# Patient Record
Sex: Female | Born: 1935 | Race: White | Hispanic: No | Marital: Married | Smoking: Never smoker
Health system: Southern US, Community
[De-identification: ages and names within clinical notes are randomized; demographics above are authoritative.]

## PROBLEM LIST (undated history)

## (undated) DIAGNOSIS — IMO0001 Reserved for inherently not codable concepts without codable children: Secondary | ICD-10-CM

## (undated) DIAGNOSIS — K589 Irritable bowel syndrome without diarrhea: Secondary | ICD-10-CM

## (undated) DIAGNOSIS — K5732 Diverticulitis of large intestine without perforation or abscess without bleeding: Secondary | ICD-10-CM

## (undated) DIAGNOSIS — M1990 Unspecified osteoarthritis, unspecified site: Secondary | ICD-10-CM

## (undated) DIAGNOSIS — K219 Gastro-esophageal reflux disease without esophagitis: Secondary | ICD-10-CM

## (undated) DIAGNOSIS — D508 Other iron deficiency anemias: Secondary | ICD-10-CM

## (undated) DIAGNOSIS — J9601 Acute respiratory failure with hypoxia: Secondary | ICD-10-CM

## (undated) DIAGNOSIS — J312 Chronic pharyngitis: Secondary | ICD-10-CM

## (undated) DIAGNOSIS — K922 Gastrointestinal hemorrhage, unspecified: Secondary | ICD-10-CM

## (undated) DIAGNOSIS — J189 Pneumonia, unspecified organism: Secondary | ICD-10-CM

## (undated) DIAGNOSIS — K625 Hemorrhage of anus and rectum: Secondary | ICD-10-CM

## (undated) DIAGNOSIS — D473 Essential (hemorrhagic) thrombocythemia: Secondary | ICD-10-CM

## (undated) DIAGNOSIS — N189 Chronic kidney disease, unspecified: Secondary | ICD-10-CM

## (undated) DIAGNOSIS — J45909 Unspecified asthma, uncomplicated: Secondary | ICD-10-CM

## (undated) DIAGNOSIS — J069 Acute upper respiratory infection, unspecified: Secondary | ICD-10-CM

## (undated) DIAGNOSIS — D72829 Elevated white blood cell count, unspecified: Secondary | ICD-10-CM

## (undated) DIAGNOSIS — R011 Cardiac murmur, unspecified: Secondary | ICD-10-CM

## (undated) DIAGNOSIS — K5720 Diverticulitis of large intestine with perforation and abscess without bleeding: Secondary | ICD-10-CM

## (undated) DIAGNOSIS — Z5189 Encounter for other specified aftercare: Secondary | ICD-10-CM

## (undated) DIAGNOSIS — E785 Hyperlipidemia, unspecified: Secondary | ICD-10-CM

## (undated) DIAGNOSIS — R0600 Dyspnea, unspecified: Secondary | ICD-10-CM

## (undated) DIAGNOSIS — E871 Hypo-osmolality and hyponatremia: Secondary | ICD-10-CM

## (undated) DIAGNOSIS — E039 Hypothyroidism, unspecified: Secondary | ICD-10-CM

## (undated) DIAGNOSIS — R2689 Other abnormalities of gait and mobility: Secondary | ICD-10-CM

## (undated) DIAGNOSIS — I10 Essential (primary) hypertension: Secondary | ICD-10-CM

## (undated) DIAGNOSIS — R0609 Other forms of dyspnea: Secondary | ICD-10-CM

## (undated) DIAGNOSIS — R42 Dizziness and giddiness: Secondary | ICD-10-CM

## (undated) DIAGNOSIS — K633 Ulcer of intestine: Secondary | ICD-10-CM

## (undated) DIAGNOSIS — A319 Mycobacterial infection, unspecified: Secondary | ICD-10-CM

## (undated) DIAGNOSIS — B370 Candidal stomatitis: Secondary | ICD-10-CM

## (undated) DIAGNOSIS — D75839 Thrombocytosis, unspecified: Secondary | ICD-10-CM

## (undated) HISTORY — PX: VESICOVAGINAL FISTULA CLOSURE W/ TAH: SUR271

## (undated) HISTORY — DX: Pneumonia, unspecified organism: J18.9

## (undated) HISTORY — DX: Hypo-osmolality and hyponatremia: E87.1

## (undated) HISTORY — DX: Gastro-esophageal reflux disease without esophagitis: K21.9

## (undated) HISTORY — DX: Cardiac murmur, unspecified: R01.1

## (undated) HISTORY — DX: Candidal stomatitis: B37.0

## (undated) HISTORY — DX: Essential (primary) hypertension: I10

## (undated) HISTORY — DX: Dyspnea, unspecified: R06.00

## (undated) HISTORY — DX: Unspecified asthma, uncomplicated: J45.909

## (undated) HISTORY — DX: Unspecified osteoarthritis, unspecified site: M19.90

## (undated) HISTORY — DX: Mycobacterial infection, unspecified: A31.9

## (undated) HISTORY — DX: Other iron deficiency anemias: D50.8

## (undated) HISTORY — DX: Acute respiratory failure with hypoxia: J96.01

## (undated) HISTORY — DX: Thrombocytosis, unspecified: D75.839

## (undated) HISTORY — DX: Hyperlipidemia, unspecified: E78.5

## (undated) HISTORY — DX: Elevated white blood cell count, unspecified: D72.829

## (undated) HISTORY — PX: APPENDECTOMY: SHX54

## (undated) HISTORY — PX: CRANIOTOMY: SHX93

## (undated) HISTORY — DX: Chronic kidney disease, unspecified: N18.9

## (undated) HISTORY — PX: OTHER SURGICAL HISTORY: SHX169

## (undated) HISTORY — PX: EYE SURGERY: SHX253

## (undated) HISTORY — DX: Diverticulitis of large intestine without perforation or abscess without bleeding: K57.32

## (undated) HISTORY — DX: Other forms of dyspnea: R06.09

## (undated) HISTORY — DX: Ulcer of intestine: K63.3

## (undated) HISTORY — DX: Diverticulitis of large intestine with perforation and abscess without bleeding: K57.20

## (undated) HISTORY — DX: Gastrointestinal hemorrhage, unspecified: K92.2

## (undated) HISTORY — DX: Dizziness and giddiness: R42

## (undated) HISTORY — DX: Essential (hemorrhagic) thrombocythemia: D47.3

## (undated) HISTORY — DX: Hemorrhage of anus and rectum: K62.5

## (undated) HISTORY — DX: Hypothyroidism, unspecified: E03.9

## (undated) HISTORY — DX: Chronic pharyngitis: J31.2

---
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
## 1965-05-11 HISTORY — PX: DILATION AND CURETTAGE OF UTERUS: SHX78

## 1983-05-12 HISTORY — PX: ABDOMINAL HYSTERECTOMY: SHX81

## 1984-05-11 HISTORY — PX: THYROIDECTOMY: SHX17

## 1997-10-02 ENCOUNTER — Other Ambulatory Visit: Admission: RE | Admit: 1997-10-02 | Discharge: 1997-10-02 | Payer: Self-pay | Admitting: *Deleted

## 1998-05-11 HISTORY — PX: OTHER SURGICAL HISTORY: SHX169

## 1998-07-17 ENCOUNTER — Encounter: Payer: Self-pay | Admitting: Oral Surgery

## 1998-07-17 ENCOUNTER — Inpatient Hospital Stay (HOSPITAL_COMMUNITY): Admission: EM | Admit: 1998-07-17 | Discharge: 1998-07-21 | Payer: Self-pay | Admitting: Emergency Medicine

## 1998-10-09 ENCOUNTER — Other Ambulatory Visit: Admission: RE | Admit: 1998-10-09 | Discharge: 1998-10-09 | Payer: Self-pay | Admitting: *Deleted

## 1999-10-21 ENCOUNTER — Other Ambulatory Visit: Admission: RE | Admit: 1999-10-21 | Discharge: 1999-10-21 | Payer: Self-pay | Admitting: *Deleted

## 2000-01-14 ENCOUNTER — Encounter: Admission: RE | Admit: 2000-01-14 | Discharge: 2000-01-14 | Payer: Self-pay | Admitting: Internal Medicine

## 2000-01-14 ENCOUNTER — Encounter: Payer: Self-pay | Admitting: Internal Medicine

## 2000-12-02 ENCOUNTER — Other Ambulatory Visit: Admission: RE | Admit: 2000-12-02 | Discharge: 2000-12-02 | Payer: Self-pay | Admitting: *Deleted

## 2001-01-12 ENCOUNTER — Ambulatory Visit (HOSPITAL_COMMUNITY): Admission: RE | Admit: 2001-01-12 | Discharge: 2001-01-12 | Payer: Self-pay | Admitting: Gastroenterology

## 2001-07-17 ENCOUNTER — Emergency Department (HOSPITAL_COMMUNITY): Admission: EM | Admit: 2001-07-17 | Discharge: 2001-07-17 | Payer: Self-pay | Admitting: Emergency Medicine

## 2002-09-18 ENCOUNTER — Encounter: Admission: RE | Admit: 2002-09-18 | Discharge: 2002-12-17 | Payer: Self-pay | Admitting: Internal Medicine

## 2003-06-18 ENCOUNTER — Ambulatory Visit (HOSPITAL_COMMUNITY): Admission: RE | Admit: 2003-06-18 | Discharge: 2003-06-18 | Payer: Self-pay | Admitting: Gastroenterology

## 2003-06-19 ENCOUNTER — Encounter (INDEPENDENT_AMBULATORY_CARE_PROVIDER_SITE_OTHER): Payer: Self-pay | Admitting: Specialist

## 2003-07-26 ENCOUNTER — Encounter: Admission: RE | Admit: 2003-07-26 | Discharge: 2003-07-26 | Payer: Self-pay | Admitting: Internal Medicine

## 2004-01-15 ENCOUNTER — Ambulatory Visit (HOSPITAL_COMMUNITY): Admission: RE | Admit: 2004-01-15 | Discharge: 2004-01-15 | Payer: Self-pay | Admitting: Internal Medicine

## 2004-01-16 ENCOUNTER — Ambulatory Visit (HOSPITAL_COMMUNITY): Admission: RE | Admit: 2004-01-16 | Discharge: 2004-01-16 | Payer: Self-pay | Admitting: Internal Medicine

## 2004-07-10 ENCOUNTER — Ambulatory Visit: Payer: Self-pay | Admitting: Internal Medicine

## 2004-07-17 ENCOUNTER — Ambulatory Visit: Payer: Self-pay | Admitting: Internal Medicine

## 2004-08-07 ENCOUNTER — Ambulatory Visit: Payer: Self-pay | Admitting: Internal Medicine

## 2005-02-04 ENCOUNTER — Ambulatory Visit: Payer: Self-pay | Admitting: Internal Medicine

## 2005-02-08 HISTORY — PX: TOTAL HIP ARTHROPLASTY: SHX124

## 2005-02-09 ENCOUNTER — Inpatient Hospital Stay (HOSPITAL_COMMUNITY): Admission: RE | Admit: 2005-02-09 | Discharge: 2005-02-12 | Payer: Self-pay | Admitting: Orthopedic Surgery

## 2005-05-18 ENCOUNTER — Encounter: Admission: RE | Admit: 2005-05-18 | Discharge: 2005-05-18 | Payer: Self-pay | Admitting: Gastroenterology

## 2005-06-23 ENCOUNTER — Ambulatory Visit: Payer: Self-pay | Admitting: Internal Medicine

## 2005-08-04 ENCOUNTER — Ambulatory Visit: Payer: Self-pay | Admitting: Internal Medicine

## 2005-08-20 ENCOUNTER — Encounter: Admission: RE | Admit: 2005-08-20 | Discharge: 2005-08-20 | Payer: Self-pay | Admitting: Orthopedic Surgery

## 2005-08-25 ENCOUNTER — Encounter (INDEPENDENT_AMBULATORY_CARE_PROVIDER_SITE_OTHER): Payer: Self-pay | Admitting: *Deleted

## 2005-08-25 ENCOUNTER — Ambulatory Visit (HOSPITAL_BASED_OUTPATIENT_CLINIC_OR_DEPARTMENT_OTHER): Admission: RE | Admit: 2005-08-25 | Discharge: 2005-08-25 | Payer: Self-pay | Admitting: Orthopedic Surgery

## 2005-11-02 ENCOUNTER — Ambulatory Visit: Payer: Self-pay | Admitting: Internal Medicine

## 2005-11-09 ENCOUNTER — Ambulatory Visit: Payer: Self-pay | Admitting: Hematology & Oncology

## 2005-11-25 LAB — CBC WITH DIFFERENTIAL/PLATELET
Basophils Absolute: 0 10*3/uL (ref 0.0–0.1)
Eosinophils Absolute: 0.1 10*3/uL (ref 0.0–0.5)
HGB: 12.2 g/dL (ref 11.6–15.9)
LYMPH%: 9.5 % — ABNORMAL LOW (ref 14.0–48.0)
MCV: 77.4 fL — ABNORMAL LOW (ref 81.0–101.0)
MONO%: 4.1 % (ref 0.0–13.0)
NEUT#: 10.9 10*3/uL — ABNORMAL HIGH (ref 1.5–6.5)
Platelets: 555 10*3/uL — ABNORMAL HIGH (ref 145–400)

## 2005-12-01 LAB — JAK2 GENOTYPR

## 2005-12-01 LAB — VITAMIN B12: Vitamin B-12: 811 pg/mL (ref 211–911)

## 2005-12-01 LAB — LACTATE DEHYDROGENASE: LDH: 138 U/L (ref 94–250)

## 2005-12-01 LAB — TRANSFERRIN RECEPTOR, SOLUABLE: Transferrin Receptor, Soluble: 5.3 mg/L — ABNORMAL HIGH (ref 1.9–4.4)

## 2005-12-01 LAB — SEDIMENTATION RATE: Sed Rate: 60 mm/hr — ABNORMAL HIGH (ref 0–22)

## 2006-01-18 ENCOUNTER — Ambulatory Visit: Payer: Self-pay | Admitting: Hematology & Oncology

## 2006-01-20 LAB — CBC WITH DIFFERENTIAL/PLATELET
Basophils Absolute: 0 10*3/uL (ref 0.0–0.1)
EOS%: 2.3 % (ref 0.0–7.0)
HCT: 36.2 % (ref 34.8–46.6)
HGB: 12.1 g/dL (ref 11.6–15.9)
LYMPH%: 11.5 % — ABNORMAL LOW (ref 14.0–48.0)
MCH: 25.6 pg — ABNORMAL LOW (ref 26.0–34.0)
NEUT%: 80.7 % — ABNORMAL HIGH (ref 39.6–76.8)
Platelets: 525 10*3/uL — ABNORMAL HIGH (ref 145–400)
lymph#: 1.6 10*3/uL (ref 0.9–3.3)

## 2006-04-07 ENCOUNTER — Emergency Department (HOSPITAL_COMMUNITY): Admission: EM | Admit: 2006-04-07 | Discharge: 2006-04-07 | Payer: Self-pay | Admitting: Emergency Medicine

## 2006-05-11 HISTORY — PX: PARTIAL COLECTOMY: SHX5273

## 2006-05-14 ENCOUNTER — Ambulatory Visit: Payer: Self-pay | Admitting: Hematology & Oncology

## 2006-05-19 LAB — CBC WITH DIFFERENTIAL/PLATELET
BASO%: 0.2 % (ref 0.0–2.0)
EOS%: 2.3 % (ref 0.0–7.0)
HCT: 33.4 % — ABNORMAL LOW (ref 34.8–46.6)
MCH: 25.3 pg — ABNORMAL LOW (ref 26.0–34.0)
MCHC: 32.8 g/dL (ref 32.0–36.0)
NEUT%: 78 % — ABNORMAL HIGH (ref 39.6–76.8)
RDW: 15.2 % — ABNORMAL HIGH (ref 11.3–14.5)
lymph#: 1.7 10*3/uL (ref 0.9–3.3)

## 2006-06-28 ENCOUNTER — Ambulatory Visit: Payer: Self-pay | Admitting: Hematology & Oncology

## 2006-06-30 LAB — CBC WITH DIFFERENTIAL/PLATELET
BASO%: 0.3 % (ref 0.0–2.0)
EOS%: 3 % (ref 0.0–7.0)
Eosinophils Absolute: 0.3 10*3/uL (ref 0.0–0.5)
MCV: 79.2 fL — ABNORMAL LOW (ref 81.0–101.0)
MONO%: 10.6 % (ref 0.0–13.0)
NEUT#: 7.4 10*3/uL — ABNORMAL HIGH (ref 1.5–6.5)
RBC: 4.7 10*6/uL (ref 3.70–5.32)
RDW: 20.7 % — ABNORMAL HIGH (ref 11.3–14.5)

## 2006-06-30 LAB — CHCC SMEAR

## 2006-07-02 LAB — TRANSFERRIN RECEPTOR, SOLUABLE: Transferrin Receptor, Soluble: 22 nmol/L

## 2006-07-02 LAB — FERRITIN: Ferritin: 504 ng/mL — ABNORMAL HIGH (ref 10–291)

## 2006-07-12 ENCOUNTER — Ambulatory Visit: Payer: Self-pay | Admitting: Internal Medicine

## 2006-10-05 ENCOUNTER — Ambulatory Visit: Payer: Self-pay | Admitting: Hematology & Oncology

## 2006-10-07 LAB — CBC WITH DIFFERENTIAL/PLATELET
BASO%: 0.4 % (ref 0.0–2.0)
LYMPH%: 23.1 % (ref 14.0–48.0)
MCHC: 34.7 g/dL (ref 32.0–36.0)
MONO#: 1 10*3/uL — ABNORMAL HIGH (ref 0.1–0.9)
NEUT#: 6.1 10*3/uL (ref 1.5–6.5)
Platelets: 366 10*3/uL (ref 145–400)
RBC: 4.49 10*6/uL (ref 3.70–5.32)
RDW: 14.2 % (ref 11.3–14.5)
WBC: 9.6 10*3/uL (ref 3.9–10.0)

## 2006-10-07 LAB — CHCC SMEAR

## 2006-12-14 ENCOUNTER — Ambulatory Visit (HOSPITAL_COMMUNITY): Admission: RE | Admit: 2006-12-14 | Discharge: 2006-12-14 | Payer: Self-pay | Admitting: Family Medicine

## 2006-12-19 ENCOUNTER — Inpatient Hospital Stay (HOSPITAL_COMMUNITY): Admission: EM | Admit: 2006-12-19 | Discharge: 2006-12-24 | Payer: Self-pay | Admitting: Emergency Medicine

## 2007-01-27 ENCOUNTER — Encounter: Admission: RE | Admit: 2007-01-27 | Discharge: 2007-01-27 | Payer: Self-pay | Admitting: Gastroenterology

## 2007-02-01 ENCOUNTER — Ambulatory Visit: Payer: Self-pay | Admitting: Hematology & Oncology

## 2007-02-03 LAB — CBC WITH DIFFERENTIAL/PLATELET
BASO%: 0.3 % (ref 0.0–2.0)
Basophils Absolute: 0 10*3/uL (ref 0.0–0.1)
EOS%: 2.8 % (ref 0.0–7.0)
HGB: 12.6 g/dL (ref 11.6–15.9)
MCH: 28.8 pg (ref 26.0–34.0)
MCHC: 34.3 g/dL (ref 32.0–36.0)
MONO#: 1.1 10*3/uL — ABNORMAL HIGH (ref 0.1–0.9)
RDW: 13.4 % (ref 11.3–14.5)
WBC: 12.5 10*3/uL — ABNORMAL HIGH (ref 3.9–10.0)
lymph#: 2.4 10*3/uL (ref 0.9–3.3)

## 2007-02-03 LAB — FERRITIN: Ferritin: 283 ng/mL (ref 10–291)

## 2007-02-15 ENCOUNTER — Encounter (INDEPENDENT_AMBULATORY_CARE_PROVIDER_SITE_OTHER): Payer: Self-pay | Admitting: Surgery

## 2007-02-15 ENCOUNTER — Inpatient Hospital Stay (HOSPITAL_COMMUNITY): Admission: RE | Admit: 2007-02-15 | Discharge: 2007-02-23 | Payer: Self-pay | Admitting: Surgery

## 2007-04-19 ENCOUNTER — Ambulatory Visit: Payer: Self-pay | Admitting: Hematology & Oncology

## 2007-04-21 ENCOUNTER — Encounter: Payer: Self-pay | Admitting: Internal Medicine

## 2007-04-21 LAB — CBC WITH DIFFERENTIAL/PLATELET
BASO%: 0.5 % (ref 0.0–2.0)
Eosinophils Absolute: 0.2 10*3/uL (ref 0.0–0.5)
HCT: 36.8 % (ref 34.8–46.6)
HGB: 12.4 g/dL (ref 11.6–15.9)
LYMPH%: 21.7 % (ref 14.0–48.0)
MCHC: 33.6 g/dL (ref 32.0–36.0)
MONO#: 0.9 10*3/uL (ref 0.1–0.9)
NEUT#: 6.5 10*3/uL (ref 1.5–6.5)
NEUT%: 66.7 % (ref 39.6–76.8)
Platelets: 395 10*3/uL (ref 145–400)
WBC: 9.8 10*3/uL (ref 3.9–10.0)
lymph#: 2.1 10*3/uL (ref 0.9–3.3)

## 2007-04-23 LAB — TRANSFERRIN RECEPTOR, SOLUABLE: Transferrin Receptor, Soluble: 22.6 nmol/L

## 2007-04-29 ENCOUNTER — Emergency Department (HOSPITAL_COMMUNITY): Admission: EM | Admit: 2007-04-29 | Discharge: 2007-04-29 | Payer: Self-pay | Admitting: Emergency Medicine

## 2007-07-11 DIAGNOSIS — J300 Vasomotor rhinitis: Secondary | ICD-10-CM

## 2007-07-11 DIAGNOSIS — J312 Chronic pharyngitis: Secondary | ICD-10-CM

## 2007-07-11 DIAGNOSIS — M129 Arthropathy, unspecified: Secondary | ICD-10-CM | POA: Insufficient documentation

## 2007-07-11 DIAGNOSIS — D759 Disease of blood and blood-forming organs, unspecified: Secondary | ICD-10-CM | POA: Insufficient documentation

## 2007-07-11 DIAGNOSIS — D72829 Elevated white blood cell count, unspecified: Secondary | ICD-10-CM | POA: Insufficient documentation

## 2007-07-11 DIAGNOSIS — J209 Acute bronchitis, unspecified: Secondary | ICD-10-CM

## 2007-07-11 DIAGNOSIS — K5732 Diverticulitis of large intestine without perforation or abscess without bleeding: Secondary | ICD-10-CM

## 2007-07-11 DIAGNOSIS — E785 Hyperlipidemia, unspecified: Secondary | ICD-10-CM

## 2007-07-11 DIAGNOSIS — R011 Cardiac murmur, unspecified: Secondary | ICD-10-CM

## 2007-07-11 DIAGNOSIS — I10 Essential (primary) hypertension: Secondary | ICD-10-CM | POA: Insufficient documentation

## 2007-07-11 DIAGNOSIS — K219 Gastro-esophageal reflux disease without esophagitis: Secondary | ICD-10-CM | POA: Insufficient documentation

## 2007-07-11 DIAGNOSIS — D508 Other iron deficiency anemias: Secondary | ICD-10-CM | POA: Insufficient documentation

## 2007-07-27 ENCOUNTER — Ambulatory Visit: Payer: Self-pay | Admitting: Internal Medicine

## 2007-10-18 ENCOUNTER — Ambulatory Visit: Payer: Self-pay | Admitting: Hematology & Oncology

## 2007-10-20 ENCOUNTER — Encounter: Payer: Self-pay | Admitting: Internal Medicine

## 2007-10-20 LAB — CBC WITH DIFFERENTIAL/PLATELET
Basophils Absolute: 0.1 10*3/uL (ref 0.0–0.1)
Eosinophils Absolute: 0.3 10*3/uL (ref 0.0–0.5)
HCT: 37.4 % (ref 34.8–46.6)
HGB: 12.4 g/dL (ref 11.6–15.9)
LYMPH%: 25.6 % (ref 14.0–48.0)
MCV: 83.3 fL (ref 81.0–101.0)
MONO%: 10.1 % (ref 0.0–13.0)
NEUT#: 6.1 10*3/uL (ref 1.5–6.5)
Platelets: 358 10*3/uL (ref 145–400)

## 2007-10-22 LAB — TRANSFERRIN RECEPTOR, SOLUABLE: Transferrin Receptor, Soluble: 23.3 nmol/L

## 2008-03-16 ENCOUNTER — Ambulatory Visit (HOSPITAL_COMMUNITY): Admission: RE | Admit: 2008-03-16 | Discharge: 2008-03-16 | Payer: Self-pay | Admitting: Urology

## 2008-05-01 ENCOUNTER — Ambulatory Visit: Payer: Self-pay | Admitting: Internal Medicine

## 2008-07-17 ENCOUNTER — Ambulatory Visit: Payer: Self-pay | Admitting: Internal Medicine

## 2008-11-07 ENCOUNTER — Encounter: Payer: Self-pay | Admitting: Internal Medicine

## 2008-11-27 ENCOUNTER — Telehealth (INDEPENDENT_AMBULATORY_CARE_PROVIDER_SITE_OTHER): Payer: Self-pay | Admitting: *Deleted

## 2008-11-27 ENCOUNTER — Ambulatory Visit: Payer: Self-pay | Admitting: Internal Medicine

## 2008-11-27 ENCOUNTER — Telehealth: Payer: Self-pay | Admitting: Internal Medicine

## 2008-11-27 DIAGNOSIS — R930 Abnormal findings on diagnostic imaging of skull and head, not elsewhere classified: Secondary | ICD-10-CM | POA: Insufficient documentation

## 2008-11-27 LAB — CONVERTED CEMR LAB
Basophils Absolute: 0.1 10*3/uL (ref 0.0–0.1)
Eosinophils Absolute: 0.3 10*3/uL (ref 0.0–0.7)
Eosinophils Relative: 1.7 % (ref 0.0–5.0)
GFR calc non Af Amer: 73.78 mL/min (ref >60)
MCHC: 34.2 g/dL (ref 30.0–36.0)
MCV: 78.4 fL (ref 78.0–100.0)
Monocytes Absolute: 0.8 10*3/uL (ref 0.1–1.0)
Neutrophils Relative %: 88.7 % — ABNORMAL HIGH (ref 43.0–77.0)
Platelets: 331 10*3/uL (ref 150.0–400.0)
Potassium: 3.3 meq/L — ABNORMAL LOW (ref 3.5–5.1)
Sodium: 141 meq/L (ref 135–145)
WBC: 18 10*3/uL (ref 4.5–10.5)

## 2008-11-28 ENCOUNTER — Telehealth (INDEPENDENT_AMBULATORY_CARE_PROVIDER_SITE_OTHER): Payer: Self-pay | Admitting: *Deleted

## 2008-12-12 ENCOUNTER — Ambulatory Visit: Payer: Self-pay | Admitting: Internal Medicine

## 2008-12-18 ENCOUNTER — Encounter: Payer: Self-pay | Admitting: Internal Medicine

## 2008-12-31 ENCOUNTER — Ambulatory Visit: Payer: Self-pay | Admitting: Internal Medicine

## 2009-04-09 ENCOUNTER — Encounter: Payer: Self-pay | Admitting: Internal Medicine

## 2009-04-30 ENCOUNTER — Ambulatory Visit: Payer: Self-pay | Admitting: Internal Medicine

## 2009-04-30 DIAGNOSIS — B370 Candidal stomatitis: Secondary | ICD-10-CM

## 2009-04-30 DIAGNOSIS — J4530 Mild persistent asthma, uncomplicated: Secondary | ICD-10-CM | POA: Insufficient documentation

## 2009-04-30 HISTORY — DX: Candidal stomatitis: B37.0

## 2009-05-07 ENCOUNTER — Emergency Department (HOSPITAL_COMMUNITY): Admission: EM | Admit: 2009-05-07 | Discharge: 2009-05-08 | Payer: Self-pay | Admitting: Emergency Medicine

## 2009-05-08 ENCOUNTER — Ambulatory Visit: Payer: Self-pay | Admitting: Internal Medicine

## 2009-05-14 ENCOUNTER — Telehealth: Payer: Self-pay | Admitting: Internal Medicine

## 2009-05-14 ENCOUNTER — Inpatient Hospital Stay (HOSPITAL_COMMUNITY): Admission: EM | Admit: 2009-05-14 | Discharge: 2009-05-20 | Payer: Self-pay | Admitting: Emergency Medicine

## 2009-05-14 ENCOUNTER — Ambulatory Visit: Payer: Self-pay | Admitting: Critical Care Medicine

## 2009-05-27 ENCOUNTER — Ambulatory Visit: Payer: Self-pay | Admitting: Internal Medicine

## 2009-06-03 ENCOUNTER — Telehealth (INDEPENDENT_AMBULATORY_CARE_PROVIDER_SITE_OTHER): Payer: Self-pay | Admitting: *Deleted

## 2009-06-19 ENCOUNTER — Ambulatory Visit: Payer: Self-pay | Admitting: Internal Medicine

## 2009-08-20 ENCOUNTER — Encounter: Admission: RE | Admit: 2009-08-20 | Discharge: 2009-08-20 | Payer: Self-pay | Admitting: Otolaryngology

## 2009-08-29 ENCOUNTER — Ambulatory Visit: Payer: Self-pay | Admitting: Internal Medicine

## 2009-09-02 ENCOUNTER — Ambulatory Visit: Payer: Self-pay | Admitting: Internal Medicine

## 2009-09-09 ENCOUNTER — Ambulatory Visit: Payer: Self-pay | Admitting: Internal Medicine

## 2009-09-09 ENCOUNTER — Telehealth (INDEPENDENT_AMBULATORY_CARE_PROVIDER_SITE_OTHER): Payer: Self-pay | Admitting: *Deleted

## 2009-11-05 ENCOUNTER — Ambulatory Visit: Payer: Self-pay | Admitting: Internal Medicine

## 2009-11-05 DIAGNOSIS — R490 Dysphonia: Secondary | ICD-10-CM | POA: Insufficient documentation

## 2009-12-16 ENCOUNTER — Telehealth: Payer: Self-pay | Admitting: Internal Medicine

## 2009-12-24 ENCOUNTER — Encounter: Payer: Self-pay | Admitting: Internal Medicine

## 2010-02-27 ENCOUNTER — Ambulatory Visit: Payer: Self-pay | Admitting: Internal Medicine

## 2010-03-02 DIAGNOSIS — K5190 Ulcerative colitis, unspecified, without complications: Secondary | ICD-10-CM | POA: Insufficient documentation

## 2010-03-20 ENCOUNTER — Encounter: Admission: RE | Admit: 2010-03-20 | Discharge: 2010-03-20 | Payer: Self-pay | Admitting: Gastroenterology

## 2010-04-05 ENCOUNTER — Telehealth: Payer: Self-pay | Admitting: Pulmonary Disease

## 2010-04-09 ENCOUNTER — Telehealth (INDEPENDENT_AMBULATORY_CARE_PROVIDER_SITE_OTHER): Payer: Self-pay | Admitting: *Deleted

## 2010-04-10 ENCOUNTER — Ambulatory Visit: Payer: Self-pay | Admitting: Internal Medicine

## 2010-04-14 ENCOUNTER — Telehealth (INDEPENDENT_AMBULATORY_CARE_PROVIDER_SITE_OTHER): Payer: Self-pay | Admitting: *Deleted

## 2010-04-14 ENCOUNTER — Ambulatory Visit: Payer: Self-pay | Admitting: Internal Medicine

## 2010-04-15 ENCOUNTER — Telehealth: Payer: Self-pay | Admitting: Internal Medicine

## 2010-04-15 ENCOUNTER — Encounter: Payer: Self-pay | Admitting: Internal Medicine

## 2010-04-16 ENCOUNTER — Telehealth (INDEPENDENT_AMBULATORY_CARE_PROVIDER_SITE_OTHER): Payer: Self-pay | Admitting: *Deleted

## 2010-04-17 ENCOUNTER — Ambulatory Visit: Payer: Self-pay | Admitting: Internal Medicine

## 2010-04-17 ENCOUNTER — Encounter: Payer: Self-pay | Admitting: Internal Medicine

## 2010-04-18 ENCOUNTER — Telehealth (INDEPENDENT_AMBULATORY_CARE_PROVIDER_SITE_OTHER): Payer: Self-pay | Admitting: *Deleted

## 2010-04-24 ENCOUNTER — Inpatient Hospital Stay (HOSPITAL_COMMUNITY)
Admission: EM | Admit: 2010-04-24 | Discharge: 2010-04-29 | Payer: Self-pay | Source: Home / Self Care | Attending: Internal Medicine | Admitting: Internal Medicine

## 2010-04-24 ENCOUNTER — Encounter: Payer: Self-pay | Admitting: Internal Medicine

## 2010-04-24 ENCOUNTER — Telehealth (INDEPENDENT_AMBULATORY_CARE_PROVIDER_SITE_OTHER): Payer: Self-pay | Admitting: *Deleted

## 2010-04-24 LAB — CONVERTED CEMR LAB
Eosinophils Relative: 2.5 % (ref 0.0–5.0)
HCT: 36.8 % (ref 36.0–46.0)
Hemoglobin: 12.6 g/dL (ref 12.0–15.0)
Lymphs Abs: 1 10*3/uL (ref 0.7–4.0)
Monocytes Relative: 6.1 % (ref 3.0–12.0)
Neutro Abs: 11.7 10*3/uL — ABNORMAL HIGH (ref 1.4–7.7)
WBC: 14 10*3/uL — ABNORMAL HIGH (ref 4.5–10.5)

## 2010-04-25 ENCOUNTER — Encounter: Payer: Self-pay | Admitting: Internal Medicine

## 2010-04-30 ENCOUNTER — Telehealth (INDEPENDENT_AMBULATORY_CARE_PROVIDER_SITE_OTHER): Payer: Self-pay | Admitting: *Deleted

## 2010-04-30 ENCOUNTER — Telehealth: Payer: Self-pay | Admitting: Internal Medicine

## 2010-05-01 ENCOUNTER — Ambulatory Visit: Payer: Self-pay | Admitting: Internal Medicine

## 2010-05-01 ENCOUNTER — Telehealth (INDEPENDENT_AMBULATORY_CARE_PROVIDER_SITE_OTHER): Payer: Self-pay | Admitting: *Deleted

## 2010-05-01 DIAGNOSIS — E039 Hypothyroidism, unspecified: Secondary | ICD-10-CM

## 2010-05-02 ENCOUNTER — Encounter: Payer: Self-pay | Admitting: Internal Medicine

## 2010-05-05 DIAGNOSIS — A319 Mycobacterial infection, unspecified: Secondary | ICD-10-CM

## 2010-05-05 HISTORY — DX: Mycobacterial infection, unspecified: A31.9

## 2010-05-06 ENCOUNTER — Telehealth (INDEPENDENT_AMBULATORY_CARE_PROVIDER_SITE_OTHER): Payer: Self-pay | Admitting: *Deleted

## 2010-05-06 LAB — CONVERTED CEMR LAB
Calcium: 9.1 mg/dL (ref 8.4–10.5)
Creatinine, Ser: 0.8 mg/dL (ref 0.4–1.2)
Eosinophils Relative: 2.6 % (ref 0.0–5.0)
GFR calc non Af Amer: 69.48 mL/min (ref >60.00)
Glucose, Bld: 130 mg/dL — ABNORMAL HIGH (ref 70–99)
HCT: 43.8 % (ref 36.0–46.0)
Hemoglobin: 14.9 g/dL (ref 12.0–15.0)
Lymphs Abs: 2 10*3/uL (ref 0.7–4.0)
MCV: 88.2 fL (ref 78.0–100.0)
Monocytes Absolute: 0.7 10*3/uL (ref 0.1–1.0)
Neutro Abs: 14.4 10*3/uL — ABNORMAL HIGH (ref 1.4–7.7)
Platelets: 341 10*3/uL (ref 150.0–400.0)
RDW: 16.6 % — ABNORMAL HIGH (ref 11.5–14.6)
Sodium: 132 meq/L — ABNORMAL LOW (ref 135–145)
Total CK: 33 units/L (ref 7–177)
WBC: 17.6 10*3/uL — ABNORMAL HIGH (ref 4.5–10.5)

## 2010-05-30 ENCOUNTER — Other Ambulatory Visit: Payer: Self-pay | Admitting: Internal Medicine

## 2010-05-30 ENCOUNTER — Ambulatory Visit
Admission: RE | Admit: 2010-05-30 | Discharge: 2010-05-30 | Payer: Self-pay | Source: Home / Self Care | Attending: Internal Medicine | Admitting: Internal Medicine

## 2010-05-30 DIAGNOSIS — R0609 Other forms of dyspnea: Secondary | ICD-10-CM | POA: Insufficient documentation

## 2010-05-30 DIAGNOSIS — R0989 Other specified symptoms and signs involving the circulatory and respiratory systems: Secondary | ICD-10-CM

## 2010-05-30 LAB — BASIC METABOLIC PANEL
BUN: 17 mg/dL (ref 6–23)
CO2: 27 mEq/L (ref 19–32)
Calcium: 9.7 mg/dL (ref 8.4–10.5)
Chloride: 102 mEq/L (ref 96–112)
Creatinine, Ser: 0.7 mg/dL (ref 0.4–1.2)
GFR: 80.41 mL/min (ref >60.00)
Glucose, Bld: 151 mg/dL — ABNORMAL HIGH (ref 70–99)
Potassium: 4.1 mEq/L (ref 3.5–5.1)
Sodium: 139 mEq/L (ref 135–145)

## 2010-06-01 ENCOUNTER — Encounter: Payer: Self-pay | Admitting: Surgery

## 2010-06-01 ENCOUNTER — Encounter: Payer: Self-pay | Admitting: Urology

## 2010-06-02 ENCOUNTER — Ambulatory Visit: Payer: Self-pay | Admitting: Cardiology

## 2010-06-02 DIAGNOSIS — J189 Pneumonia, unspecified organism: Secondary | ICD-10-CM | POA: Insufficient documentation

## 2010-06-02 HISTORY — DX: Pneumonia, unspecified organism: J18.9

## 2010-06-03 ENCOUNTER — Ambulatory Visit
Admission: RE | Admit: 2010-06-03 | Discharge: 2010-06-03 | Payer: Self-pay | Source: Home / Self Care | Attending: Internal Medicine | Admitting: Internal Medicine

## 2010-06-03 ENCOUNTER — Telehealth (INDEPENDENT_AMBULATORY_CARE_PROVIDER_SITE_OTHER): Payer: Self-pay | Admitting: *Deleted

## 2010-06-03 ENCOUNTER — Encounter: Payer: Self-pay | Admitting: Internal Medicine

## 2010-06-10 NOTE — Progress Notes (Signed)
Summary: rx  Phone Note Call from Patient Call back at Home Phone [phone]   Caller: Patient Call For: young Reason for Call: Talk to Nurse Summary of Call: want to know if she should get an order of Cromaline before going to beach to keep fron having an asthma attack? [name] Initial call taken by: Eugene Gavia,  December 16, 2009 3:22 PM  Follow-up for Phone Call        Spoke with pt.  She states that she is going to the beach later this wk and her daughter was reccomending that she ask Dr Maple Hudson for rx for cromalin to take before going " to ward off asthma flareup".  Pt not currently having any breathing problems.  Pls advise thanks!  Allergies (verified):  1)  ! Sulfa 2)  ! Dilantin 3)  ! Cipro 4)  ! Codeine 5)  ! * Asacol Mesalamine Follow-up by: Vernie Murders,  December 16, 2009 3:39 PM  Additional Follow-up for Phone Call Additional follow up Details #1::        called and spoke with pt and she is aware of CY recs---she will stop by and take the rx with her on vacation in case she needs it. Randell Loop CMA  December 16, 2009 5:13 PM     New/Updated Medications: PREDNISONE 10 MG TABS (PREDNISONE) take 4 tabs x 2 days, 3 tabs x 2 days, 2 tabs x 2 days, 1 tab x 2 days then stop Prescriptions: PREDNISONE 10 MG TABS (PREDNISONE) take 4 tabs x 2 days, 3 tabs x 2 days, 2 tabs x 2 days, 1 tab x 2 days then stop  #20 x 0   Entered by:   Randell Loop CMA   Authorized by:   Waymon Budge MD   Signed by:   Randell Loop CMA on 12/16/2009   Method used:   Print then Give to Patient   RxID:   [id number]

## 2010-06-10 NOTE — Assessment & Plan Note (Signed)
Summary: hfu/ mbw   Primary Provider/Referring Provider:  [name] PMD, Dr Tiger Callas Allergy, Dr Lavera Vandermeer Asthma, Dr Claria Dice GI  CC:  post hosp follow up - c/o weakness and dyspnea w/ exertion.  would like refills on MMW..  History of Present Illness:  May 08, 2009- Asthmatic bronchitis, allergic rhinitis (DR [place] Callas), Ulcerative colitis Visiting daughter in [place] over holiday, had asthma she blames on that old house with dust, dry heat, large dog. Went to ER in [place] 3 days ago for neb, Steroid, CXR. Then went to [facility] ER last night for repeat. She doesn't feel Proair works as weel as her usual Combivent. She is now on pred taper, using 10 mg tabs, currently 40 mg. No fever or obvious infection. She reports both CXRs were clear. She now has xopenenex neb solution. She has a neb, but hasn't used it in a long time.  May 27, 2009--Presents for post hosp follow up - pt states her breathing is doing well,  would like a script for ativan qhs 0.5mg  to help her sleep-states was given this in the hosp.Acute exacerbation of asthma. Admitted 1/4-1/10/11 for AECOPD, CT chest was neg for PE.  Tx with aggressive pulmonary hygiene with nebs and IV steroids. Had received abx prior to admit. She was discharged on steroid taper. Feeling much better since discharge. Denies chest pain,  orthopnea, hemoptysis, fever, n/v/d, edema, headache.  June 19, 2009- Asthmatic bronchits, allergic rhinitis (Dr [place] Callas), Ulcerative Colitis.....husband here Post hospital visit. Feels very much better. She still feels sweak, easily tired. Has no cough/ sputum and hasn't needed to use her nebulizer. Has had esophageal spasms relieved by eating. We reviewed her normal PFT from August 2010.   Medications Prior to Update: 1)  Flovent Hfa 110 Mcg/act  Aero (Fluticasone Propionate  Hfa) .... Use Two Puffs Every 12 Hours 2)  Prednisone 5 Mg  Tabs (Prednisone) .... Take 1 Tablet By Mouth Once A Day 3)  Combivent  103-18 Mcg/act Aero (Ipratropium-Albuterol) .... 2 Puffs Four Times A Day As Needed 4)  Norvasc 5 Mg  Tabs (Amlodipine Besylate) .... Take 1/2 Tablet By Mouth Once Daily 5)  One-A-Day Womens Formula  Tabs (Multiple Vitamins-Calcium) .... Take 1 By Mouth Once Daily 6)  Calcium Carbonate-Vitamin D 600-400 Mg-Unit  Tabs (Calcium Carbonate-Vitamin D) .... Take 1 Tablet By Mouth Two Times A Day 7)  B Complex   Tabs (B Complex Vitamins) .... Take 1 Tablet By Mouth Once A Day 8)  Synthroid 75 Mcg  Tabs (Levothyroxine Sodium) .... Take 1 Tablet By Mouth Once A Day 9)  Prilosec 20 Mg  Cpdr (Omeprazole) .... Take 1 Tablet By Mouth Once A Day 10)  Zocor 20 Mg  Tabs (Simvastatin) .... Take 1 Tab By Mouth At Bedtime 11)  Vitamin D 2000 Unit Tabs (Cholecalciferol) .... Take 1 By Mouth Once Daily 12)  Trimethoprim 100 Mg Tabs (Trimethoprim) .... 1/2 Tablet Daily 13)  Aerochamber Spacer .... Use With Flovent 14)  Dukes Magic Mouthwashj .Marland Kitchen.. 1 Teaspoon Rinse and Swallow Four Times A Day 15)  Xopenex 1.25 Mg/65ml Nebu (Levalbuterol Hcl) .Marland Kitchen.. 1 Neb Four Times A Day As Needed 16)  Allegra 180 Mg Tabs (Fexofenadine Hcl) .... 1/2 - 1 Once Daily As Needed 17)  Ativan 0.5 Mg Tabs (Lorazepam) .... 1/2-1 By Mouth Once Daily As Needed Anxiety  Current Medications (verified): 1)  Flovent Hfa 110 Mcg/act  Aero (Fluticasone Propionate  Hfa) .... Use Two Puffs Every 12 Hours 2)  Prednisone 5 Mg  Tabs (Prednisone) .... Take 1 Tablet By Mouth Once A Day 3)  Combivent 103-18 Mcg/act Aero (Ipratropium-Albuterol) .... 2 Puffs Four Times A Day As Needed 4)  Norvasc 5 Mg  Tabs (Amlodipine Besylate) .... Take 1/2 Tablet By Mouth Once Daily 5)  One-A-Day Womens Formula  Tabs (Multiple Vitamins-Calcium) .... Take 1 By Mouth Once Daily 6)  Citracal/vitamin D 250-200 Mg-Unit Tabs (Calcium Citrate-Vitamin D) .... 2 Tabs By Mouth Once Daily 7)  B Complex   Tabs (B Complex Vitamins) .... Take 1 Tablet By Mouth Once A Day 8)  Synthroid 75  Mcg  Tabs (Levothyroxine Sodium) .... Take 1 Tablet By Mouth Once A Day 9)  Prilosec 20 Mg  Cpdr (Omeprazole) .... Take 1 Tablet By Mouth Once A Day 10)  Zocor 20 Mg  Tabs (Simvastatin) .... Take 1 Tab By Mouth At Bedtime 11)  Vitamin D 2000 Unit Tabs (Cholecalciferol) .... Take 1 By Mouth Once Daily 12)  Trimethoprim 100 Mg Tabs (Trimethoprim) .... 1/2 Tablet Daily 13)  Aerochamber Spacer .... Use With Flovent 14)  Dukes Magic Mouthwashj .Marland Kitchen.. 1 Teaspoon Rinse and Swallow Four Times A Day 15)  Xopenex 1.25 Mg/65ml Nebu (Levalbuterol Hcl) .Marland Kitchen.. 1 Neb Four Times A Day As Needed 16)  Allegra 180 Mg Tabs (Fexofenadine Hcl) .... 1/2 - 1 Once Daily As Needed 17)  Ativan 0.5 Mg Tabs (Lorazepam) .... 1/2-1 By Mouth Once Daily As Needed Anxiety  Allergies (verified): 1)  ! Sulfa 2)  ! Dilantin 3)  ! Cipro 4)  ! Codeine 5)  ! * Asacol Mesalamine  Past History:  Past Medical History: Last updated: 05/08/2009 ULCERATIVE COLITIS >dx June 2010 after hematochezia. STarted on asacol November 08, 2008 HYPERTENSION (ICD-401.9) HYPERLIPIDEMIA (ICD-272.4) CARDIAC MURMUR (ICD-785.2) DIVERTICULITIS, COLON (ICD-562.11) ARTHRITIS (ICD-716.90) ANEMIA, IRON DEFICIENCY, MICROCYTIC (ICD-280.8) THROMBOCYTOSIS (ICD-289.9) LEUKOCYTOSIS (ICD-288.60) ESOPHAGEAL REFLUX (ICD-530.81) PHARYNGITIS, CHRONIC (ICD-472.1) ALLERGIC RHINITIS (ICD-477.9)- Dr [place] Callas ASTHMATIC BRONCHITIS, ACUTE (ICD-466.0)  Past Surgical History: Last updated: 07/17/2008 Partial colectomy for diverticulitis R hip replacement tonsils hysterectomy  thyroidectomy appendectomy subglottal mucocoel craniotomy meningioma resected 2002  Family History: Last updated: 01-23-2009 Fatrher- died [place] Mother- died old age 58 Twin sister  Social History: Last updated: 05/27/2009 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  The patient denies anorexia, fever, weight loss, weight gain, vision loss, decreased hearing, hoarseness, chest pain, syncope, peripheral edema, prolonged cough, headaches, hemoptysis, and severe indigestion/heartburn.    Vital Signs:  Patient profile:   77 year old female Height:      63 inches Weight:      132.13 pounds BMI:     23.49 O2 Sat:      97 % on Room air Pulse rate:   85 / minute BP sitting:   142 / 78  (left arm) Cuff size:   regular  Vitals Entered By: Boone Master CNA (June 19, 2009 9:54 AM)  O2 Flow:  Room air CC: post hosp follow up - c/o weakness and dyspnea w/ exertion.  would like refills on MMW. Comments Medications reviewed with patient Daytime contact number verified with patient. Boone Master CNA  June 19, 2009 9:55 AM    Physical Exam  Additional Exam:  General: A/Ox3; pleasant and cooperative, NAD, SKIN: no rash, lesions NODES: no lymphadenopathy HEENT: [place]/AT, EOM- WNL, Conjuctivae- clear, PERRLA, TM-WNL, Nose- clear, Throat- , melampatti II NECK: Supple w/ fair ROM, JVD- none, normal carotid impulses w/o bruits Thyroid- , a little hoarse CHEST: CTA bilaterally HEART: RRR, no m/g/r heard ABDOMEN: UJW:JXBJ, nl pulses, no edema  NEURO: Grossly intact to observation      Impression & Recommendations:  Problem # 1:  BRONCHITIS, CHRONIC (ICD-491.9)  Slow recovery after viral pattern COPD exacerbation. I encouraged reflux precautions and progressive activity to regain endurance. if she remains more dyspneic than usual we will consider recheck of PFT for comparison against baseline last year.  Medications Added to Medication List This Visit: 1)  Citracal/vitamin D 250-200 Mg-unit Tabs (Calcium citrate-vitamin d) .... 2 tabs by mouth once daily  Other Orders: Est. Patient Level III (47829)  Patient Instructions: 1)  Keep  April appointment 2)  Try to gradually progress your walking without pushing to exhaustion.  3)  Refill script for magic mouthwash to use if needed Prescriptions: DUKES MAGIC MOUTHWASHJ 1 teaspoon rinse and swallow four times a day  #125 ml x 2   Entered and Authorized by:   Waymon Budge MD   Signed by:   Waymon Budge MD on 06/19/2009   Method used:   Print then Give to Patient   RxID:   [id number]

## 2010-06-10 NOTE — Assessment & Plan Note (Signed)
Summary: acute visit-not getting better-wheezing,cough/kcw   Primary Provider/Referring Provider:  Jacky Kindle, Dr [place] Callas Allergy, Dr young Asthma, Dr Claria Dice GI  CC:  Not getting better; cough-gree, SOB, wheezing, and light headed.Marland Kitchen  History of Present Illness:  February 27, 2010-Asthmatic bronchitis, allergic rhinitis/ Dr [place] Callas, Ulcerative colitis NurseCC: Nurse cc:6 month follow up visit-doing great and no wheezing or SOB. Had a self-limited bronchitis after here last- no problem,. No ongoing asthma concern.  Now on prednisone 20 mg daily for ulcerative colitis- Dr Randa Evens.  Remains on monthly allergy vaccine from Dr [place] Callas. Had flu shot. Saw ENT Dr Cloria Spring for dizziness- no findings. His note reviewed. He didn't find anything related to hoarseness, noting as we had, her use of flovent.   April 10, 2010 Asthmatic bronchitis, allergic rhinitis/ Dr [place] Callas, Ulcerative colitis CC-Acute visit-deep chest congestion and cough-pinkish green in color for 4 days then clear now. Since last here had Z pak clled in Nov 26- finished yesterday. Current illness began with bad sore throat/ head cold before Thanksgiving. Still maxillary pressure, clear nasal discharge, pnd, residual cough and chest rattle bother her most. Denies fever, purulent discharge or GI upset. Up to date on flu and pneumovax.   April 17, 2010- Asthmatic bronchitis, allergic rhinitis/ Dr [place] Callas, Ulcerative colitis...Marland KitchenMarland KitchenMarland Kitchenhusband here Nurse-CC: Not getting better; cough-green, SOB, wheezing,light headed. We gave depo and neb here last week. We called Avelox, then learned allergic Cipro, changed her to Biaxin. Took 3 doses starting yesterday and today has pruritic rash on lower legs. Still harsh cough, productive green no fever. Doesn't feel reflux. Saw Dr Randa Evens today- he called [place] and we worked her in now.  Sputum cx- AFB smear neg. Routine flora- no obvious pathogen target.     Preventive Screening-Counseling &  Management  Alcohol-Tobacco     Smoking Status: never  Current Medications (verified): 1)  Flovent Hfa 110 Mcg/act  Aero (Fluticasone Propionate  Hfa) .... Use One Puffs Every 12 Hours 2)  Combivent 103-18 Mcg/act Aero (Ipratropium-Albuterol) .... 2 Puffs Four Times A Day As Needed 3)  Norvasc 5 Mg  Tabs (Amlodipine Besylate) .... Take 1/2 Tablet By Mouth Once Daily 4)  One-A-Day Womens Formula  Tabs (Multiple Vitamins-Calcium) .... Take 1 By Mouth Once Daily 5)  Citracal/vitamin D 250-200 Mg-Unit Tabs (Calcium Citrate-Vitamin D) .... 2 Tabs By Mouth Once Daily 6)  B Complex   Tabs (B Complex Vitamins) .... Take 1 Tablet By Mouth Once A Day 7)  Synthroid 75 Mcg  Tabs (Levothyroxine Sodium) .... Take 1 Tablet By Mouth Once A Day 8)  Prilosec 20 Mg  Cpdr (Omeprazole) .... Take 1 Tablet By Mouth Once A Day 9)  Vitamin D 2000 Unit Tabs (Cholecalciferol) .... Take 1 By Mouth Once Daily 10)  Trimethoprim 100 Mg Tabs (Trimethoprim) .... 1/2 Tablet Daily 11)  Aerochamber Spacer .... Use With Flovent 12)  Albuterol Sulfate (2.5 Mg/66ml) 0.083% Nebu (Albuterol Sulfate) .Marland Kitchen.. 1 Vial Four Times A Day As Needed 13)  Allegra 180 Mg Tabs (Fexofenadine Hcl) .... 1/2 - 1 Once Daily As Needed 14)  Ativan 0.5 Mg Tabs (Lorazepam) .... 1/2-1 By Mouth Once Daily As Needed Anxiety 15)  Fish Oil 1000 Mg Caps (Omega-3 Fatty Acids) .... Take 2 By Mouth Once Daily 16)  Prednisone 5 Mg Tabs (Prednisone) .... Take 1 By Mouth Once Daily 17)  Mmw .... As Needed Oral Thrush 18)  Promethazine-Codeine 6.25-10 Mg/6ml Syrp (Promethazine-Codeine) .Marland Kitchen.. 1 Teaspoon Four Times A Day As Needed Cough 19)  Biaxin 500 Mg Tabs (Clarithromycin) .... Take One By Mouth Two Times A Day After A Meal  Allergies (verified): 1)  ! Sulfa 2)  ! Dilantin 3)  ! Cipro 4)  ! Codeine 5)  ! * Asacol Mesalamine  Past History:  Past Medical History: Last updated: 05/08/2009 ULCERATIVE COLITIS >dx June 2010 after hematochezia. STarted on asacol  November 08, 2008 HYPERTENSION (ICD-401.9) HYPERLIPIDEMIA (ICD-272.4) CARDIAC MURMUR (ICD-785.2) DIVERTICULITIS, COLON (ICD-562.11) ARTHRITIS (ICD-716.90) ANEMIA, IRON DEFICIENCY, MICROCYTIC (ICD-280.8) THROMBOCYTOSIS (ICD-289.9) LEUKOCYTOSIS (ICD-288.60) ESOPHAGEAL REFLUX (ICD-530.81) PHARYNGITIS, CHRONIC (ICD-472.1) ALLERGIC RHINITIS (ICD-477.9)- Dr [place] Callas ASTHMATIC BRONCHITIS, ACUTE (ICD-466.0)  Past Surgical History: Last updated: 07/17/2008 Partial colectomy for diverticulitis R hip replacement tonsils hysterectomy  thyroidectomy appendectomy subglottal mucocoel craniotomy meningioma resected 2002  Family History: Last updated: 01-19-09 Fatrher- died [place] Mother- died old age 30 Twin sister  Social History: Last updated: 05/27/2009 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  The patient denies shortness of breath at rest, non-productive cough, coughing up blood, chest pain, irregular heartbeats, acid heartburn, indigestion, loss of appetite, weight change, abdominal pain, difficulty swallowing, sore throat, tooth/dental problems, headaches, nasal congestion/difficulty breathing through nose, sneezing, ear ache, and change in color of mucus.    Vital Signs:  Patient profile:   77 year old female Height:      63 inches Weight:      133 pounds BMI:     23.65 O2 Sat:      95 % on Room air Pulse rate:   137 / minute BP sitting:   140 / 70  (left arm) Cuff size:   regular  Vitals Entered By: [name] CMA (April 17, 2010 3:04 PM)  O2 Flow:  Room air CC: Not getting better; cough-gree, SOB, wheezing,light headed.   Physical Exam  Additional Exam:  General: A/Ox3;  pleasant and cooperative, NAD, SKIN: erythema lower bilateral calves NODES: no lymphadenopathy HEENT: [place]/AT, EOM- WNL, Conjuctivae- clear, PERRLA, TM-WNL, Nose- clear, Throat- no visible drainage, she now has thrush, Mallampati  III, hoarse w/o stridor NECK: Supple w/ fair ROM, JVD- none, normal carotid impulses w/o bruits Thyroid- ,  CHEST:Coarse wheeze and squeeks with active nonproductive, vibratory  cough.  HEART: RRR, no m/g/r heard ABDOMEN:soft JYN:WGNF, nl pulses, no edema  NEURO: Grossly intact to observation      Impression & Recommendations:  Problem # 1:  ACUTE BRONCHITIS (ICD-466.0)  Acute on chronic bronchits. There are important components of wheeze, still with green sputum. She now has thrush and it looks as if she is getting a drug allergy rash, probably to biaxin.  I will give tussionex, augmentin, diflucan and send her for CXR and CBC.  The following medications were removed from the medication list:    Avelox 400 Mg Tabs (Moxifloxacin hcl) .Marland Kitchen... 1 once daily    Biaxin 500 Mg Tabs (Clarithromycin) .Marland Kitchen... Take one by mouth two times a day after a meal Her updated medication list for this problem includes:    Flovent Hfa 110 Mcg/act Aero (Fluticasone propionate  hfa) ..... Use one puffs every 12 hours    Combivent 103-18 Mcg/act Aero (Ipratropium-albuterol) .Marland Kitchen... 2 puffs four times a day as needed  Trimethoprim 100 Mg Tabs (Trimethoprim) .Marland Kitchen... 1/2 tablet daily    Albuterol Sulfate (2.5 Mg/48ml) 0.083% Nebu (Albuterol sulfate) .Marland Kitchen... 1 vial four times a day as needed    Promethazine-codeine 6.25-10 Mg/33ml Syrp (Promethazine-codeine) .Marland Kitchen... 1 teaspoon four times a day as needed cough    Augmentin 875-125 Mg Tabs (Amoxicillin-pot clavulanate) .Marland Kitchen... 1 two times a day    Tussionex Pennkinetic Er 10-8 Mg/46ml Lqcr (Hydrocod polst-chlorphen polst) .Marland Kitchen... 1 teaspoon two times a day as needed cough  Medications Added to Medication List This Visit: 1)  Augmentin 875-125 Mg Tabs  (Amoxicillin-pot clavulanate) .Marland Kitchen.. 1 two times a day 2)  Tussionex Pennkinetic Er 10-8 Mg/59ml Lqcr (Hydrocod polst-chlorphen polst) .Marland Kitchen.. 1 teaspoon two times a day as needed cough 3)  Fluconazole 150 Mg Tabs (Fluconazole) .Marland Kitchen.. 1 daily x 7 days for thrush  Other Orders: Est. Patient Level III (16109) TLB-CBC Platelet - w/Differential (85025-CBCD) T-2 View CXR (71020TC)  Patient Instructions: 1)  Keep scheduled appoinment. Please call earlier as needed. 2)  A chest x-ray has been recommended.  Your imaging study may require preauthorization.  3)  Lab 4)  Scripts for Tussionex cough syrup, augmentin antibiotic to replace clarithromycin/ biaxin, and fluconazole/ diflucan for thrush/ yeast.  5)  cc Dr Randa Evens Prescriptions: FLUCONAZOLE 150 MG TABS (FLUCONAZOLE) 1 daily x 7 days for thrush  #7 x 0   Entered and Authorized by:   Waymon Budge MD   Signed by:   Waymon Budge MD on 04/17/2010   Method used:   Print then Give to Patient   RxID:   [id number] TUSSIONEX PENNKINETIC ER 10-8 MG/5ML LQCR (HYDROCOD POLST-CHLORPHEN POLST) 1 teaspoon two times a day as needed cough  #200 ml x 0   Entered and Authorized by:   Waymon Budge MD   Signed by:   Waymon Budge MD on 04/17/2010   Method used:   Print then Give to Patient   RxID:   [id number] AUGMENTIN 875-125 MG TABS (AMOXICILLIN-POT CLAVULANATE) 1 two times a day  #14 x 1   Entered and Authorized by:   Waymon Budge MD   Signed by:   Waymon Budge MD on 04/17/2010   Method used:     RxID:   [id number]

## 2010-06-10 NOTE — Assessment & Plan Note (Signed)
Summary: cough,congestion,?bronchitis, wheezing/apc   Primary Provider/Referring Provider:  [name] PMD, Dr [place] Callas Allergy, Dr Carston Riedl Asthma, Dr Claria Dice GI  CC:  Deep cough w/ light green mucous, wheezing, SOB with exertion, sore throat, X 3days, and have coughing episodes .  History of Present Illness: May 27, 2009--Presents for post hosp follow up - pt states her breathing is doing well,  would like a script for ativan qhs 0.5mg  to help her sleep-states was given this in the hosp.Acute exacerbation of asthma. Admitted 1/4-1/10/11 for AECOPD, CT chest was neg for PE.  Tx with aggressive pulmonary hygiene with nebs and IV steroids. Had received abx prior to admit. She was discharged on steroid taper. Feeling much better since discharge. Denies chest pain,  orthopnea, hemoptysis, fever, n/v/d, edema, headache.  June 19, 2009- Asthmatic bronchits, allergic rhinitis (Dr [place] Callas), Ulcerative Colitis.....husband here Post hospital visit. Feels very much better. She still feels sweak, easily tired. Has no cough/ sputum and hasn't needed to use her nebulizer. Has had esophageal spasms relieved by eating. We reviewed her normal PFT from August 2010.   August 29, 2009- Asthmatic bronchitis, allergic rhinitis (/dr [place] Callas), Ulcerative colitis Near baseline. needed neb only once since last here. Morning cough small plug mucus, then fine. Some DOE on stairs and working outside, but denies wheeze, chest pain, palpitation, swelling. Uses spacer with Flovent, remains on prednisone for adrenal insufficiency. Can't stay clear of thrush.  September 02, 2009-  Was doing much better when here last week, but that afternmoon she got sore throat and progressed to deep chest congestion with no fever, little sputum. Needed nebulizer through the weekend. She was afraid hse was getting pneumonia. She didn't recognize a reflux event.   Allergies: 1)  ! Sulfa 2)  ! Dilantin 3)  ! Cipro 4)  ! Codeine 5)  ! *  Asacol Mesalamine  Past History:  Past Medical History: Last updated: 05/08/2009 ULCERATIVE COLITIS >dx June 2010 after hematochezia. STarted on asacol November 08, 2008 HYPERTENSION (ICD-401.9) HYPERLIPIDEMIA (ICD-272.4) CARDIAC MURMUR (ICD-785.2) DIVERTICULITIS, COLON (ICD-562.11) ARTHRITIS (ICD-716.90) ANEMIA, IRON DEFICIENCY, MICROCYTIC (ICD-280.8) THROMBOCYTOSIS (ICD-289.9) LEUKOCYTOSIS (ICD-288.60) ESOPHAGEAL REFLUX (ICD-530.81) PHARYNGITIS, CHRONIC (ICD-472.1) ALLERGIC RHINITIS (ICD-477.9)- Dr  Callas ASTHMATIC BRONCHITIS, ACUTE (ICD-466.0)  Past Surgical History: Last updated: 07/17/2008 Partial colectomy for diverticulitis R hip replacement tonsils hysterectomy  thyroidectomy appendectomy subglottal mucocoel craniotomy meningioma resected 2002  Family History: Last updated: 2009/01/14 Fatrher- died [place] Mother- died old age 70 Twin sister  Social History: Last updated: 05/27/2009 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  The patient denies anorexia, fever, weight loss, weight gain, vision loss, decreased hearing, hoarseness, chest pain, syncope, peripheral edema, headaches, hemoptysis, and severe indigestion/heartburn.    Vital Signs:  Patient profile:   77 year old female Height:      63 inches Weight:      136 pounds O2 Sat:      94 % on Room air Pulse rate:   107 / minute BP sitting:   132 / 72  (right arm) Cuff size:   regular  O2 Flow:  Room air CC: Deep cough w/ light green mucous, wheezing, SOB with exertion, sore throat, X 3days, have coughing episodes  Comments  Meds updated and Allergies uodated   Physical Exam  Additional Exam:  General: A/Ox3; pleasant and cooperative, NAD, SKIN: no  rash, lesions NODES: no lymphadenopathy HEENT: [place]/AT, EOM- WNL, Conjuctivae- clear, PERRLA, TM-WNL, Nose- clear, Throat- reddish without exudate, Mallampati  III NECK: Supple w/ fair ROM, JVD- none, normal carotid impulses w/o bruits Thyroid- ,  CHEST:wheezey, congested cough clear to P&A HEART: RRR, no m/g/r heard ABDOMEN: IHK:VQQV, nl pulses, no edema  NEURO: Grossly intact to observation      Impression & Recommendations:  Problem # 1:  ACUTE BRONCHITIS (ICD-466.0)  Very rapid onset, but I can't get an aspiration history. We will get CXR and cover with prednisone burst and doxycycline. Her updated medication list for this problem includes:    Flovent Hfa 110 Mcg/act Aero (Fluticasone propionate  hfa) ..... Use two puffs every 12 hours    Combivent 103-18 Mcg/act Aero (Ipratropium-albuterol) .Marland Kitchen... 2 puffs four times a day as needed    Trimethoprim 100 Mg Tabs (Trimethoprim) .Marland Kitchen... 1/2 tablet daily    Xopenex 1.25 Mg/33ml Nebu (Levalbuterol hcl) .Marland Kitchen... 1 neb four times a day as needed    Doxycycline Hyclate 100 Mg Caps (Doxycycline hyclate) .Marland Kitchen... 2 today then one daily  Medications Added to Medication List This Visit: 1)  Doxycycline Hyclate 100 Mg Caps (Doxycycline hyclate) .... 2 today then one daily  Other Orders: Est. Patient Level III (95638) Prescription Created Electronically [id number]) T-2 View CXR ([id number])  Patient Instructions: 1)  Please schedule a follow-up appointment in 2 months. 2)  A chest x-ray has been recommended.  Your imaging study may require preauthorization.  3)  use your nebulizer up to 4 x daily if needed 4)  use Mucinex if it helps thin the mucus 5)  Script for doxycycline 6)  Increase prednisone to 30 mg daily (6 tabs) x 2 days,  7)  20 mg (4 tabs) x 2 days 8)  10 mg ( 2 tabs) x 2 days.....Marland Kitchenthen back to 1 tab daily Prescriptions: DOXYCYCLINE HYCLATE 100 MG CAPS (DOXYCYCLINE HYCLATE) 2 today then one daily  #8 x 0   Entered and Authorized by:    Waymon Budge MD   Signed by:   Waymon Budge MD on 09/02/2009   Method used:   Electronically to        [facility]* (retail)       [address]       [place], [place]  [address]       Ph: [phone] or [phone]       Fax: [phone]   RxID:   [id number]

## 2010-06-10 NOTE — Assessment & Plan Note (Signed)
Summary: ROV 6 MONTHS///KP   Primary Provider/Referring Provider:  Jacky Kindle, Dr [place] Callas Allergy, Dr young Asthma, Dr Claria Dice GI  CC:  6 month follow up visit-doing great and no wheezing or SOB.Marland Kitchen  History of Present Illness: Sep 09, 2009- Asthmatic bronchitis, allergic rhinitis/ Dr [place] Callas, Maurene Capes Colitis She has remained congested , coughing and wheezing. This has been going on almost back to Christmas.. She got worse yesterday and didn't go to church. She used the nebulizer last week, but didn't like the stimulation. Coughing up some greenish pink mucus with no fever. Tussive anterior chest wall soreness.  Finishing antibiotic and prednisone taper- back to maintenance prednisone 5 mg daily. CXR- 09/02/09- Stable CE, COPD changes, but NAD. PFT 12/2008- WNL  November 05, 2009- Asthmatic bronchitis, allergic rhinitis/ Dr [place] Callas, Ulcerative colitis Doing well. Admits some raspy voice. Remote hx vocal cord nodule- Dr Haroldine Laws. More recently seeing Dr Lazarus Salines for ears.. Feels heartburn if leaves prilosec off, but never skips it. Had good trip to [place]- had  been concerned but had no problems this trip and has not needed to take more than her routine meds. CXR- stable, NAD no spots, mild CE  February 27, 2010-Asthmatic bronchitis, allergic rhinitis/ Dr [place] Callas, Ulcerative colitis NurseCC: Nurse cc:6 month follow up visit-doing great and no wheezing or SOB. Had a self-limited bronchitis after here last- no problem,. No ongoing asthma concern.  Now on prednisone 20 mg daily for ulcerative colitis- Dr Randa Evens.  Remains on monthly allergy vaccine from Dr  Callas. Had flu shot. Saw ENT Dr Cloria Spring for dizziness- no findings. His note reviewed. He didn't find anything related to hoarseness, noting as we had, her use of flovent.   Preventive Screening-Counseling & Management  Alcohol-Tobacco     Smoking Status: never  Current Medications (verified): 1)  Flovent Hfa 110 Mcg/act  Aero (Fluticasone  Propionate  Hfa) .... Use Two Puffs Every 12 Hours 2)  Combivent 103-18 Mcg/act Aero (Ipratropium-Albuterol) .... 2 Puffs Four Times A Day As Needed 3)  Norvasc 5 Mg  Tabs (Amlodipine Besylate) .... Take 1/2 Tablet By Mouth Once Daily 4)  One-A-Day Womens Formula  Tabs (Multiple Vitamins-Calcium) .... Take 1 By Mouth Once Daily 5)  Citracal/vitamin D 250-200 Mg-Unit Tabs (Calcium Citrate-Vitamin D) .... 2 Tabs By Mouth Once Daily 6)  B Complex   Tabs (B Complex Vitamins) .... Take 1 Tablet By Mouth Once A Day 7)  Synthroid 75 Mcg  Tabs (Levothyroxine Sodium) .... Take 1 Tablet By Mouth Once A Day 8)  Prilosec 20 Mg  Cpdr (Omeprazole) .... Take 1 Tablet By Mouth Once A Day 9)  Vitamin D 2000 Unit Tabs (Cholecalciferol) .... Take 1 By Mouth Once Daily 10)  Trimethoprim 100 Mg Tabs (Trimethoprim) .... 1/2 Tablet Daily 11)  Aerochamber Spacer .... Use With Flovent 12)  Albuterol Sulfate (2.5 Mg/58ml) 0.083% Nebu (Albuterol Sulfate) .Marland Kitchen.. 1 Vial Four Times A Day As Needed 13)  Allegra 180 Mg Tabs (Fexofenadine Hcl) .... 1/2 - 1 Once Daily As Needed 14)  Ativan 0.5 Mg Tabs (Lorazepam) .... 1/2-1 By Mouth Once Daily As Needed Anxiety 15)  Fish Oil 1000 Mg Caps (Omega-3 Fatty Acids) .... Take 4 By Mouth Once Daily 16)  Prednisone 20 Mg Tabs (Prednisone) .... Take 1 By Mouth Once Daily 17)  Citracal Plus  Tabs (Multiple Minerals-Vitamins) .... Take 2 By Mouth Once Daily 18)  Mmw .... As Needed Oral Thrush  Allergies (verified): 1)  ! Sulfa 2)  ! Dilantin 3)  !  Cipro 4)  ! Codeine 5)  ! * Asacol Mesalamine  Past History:  Past Medical History: Last updated: 05/08/2009 ULCERATIVE COLITIS >dx June 2010 after hematochezia. STarted on asacol November 08, 2008 HYPERTENSION (ICD-401.9) HYPERLIPIDEMIA (ICD-272.4) CARDIAC MURMUR (ICD-785.2) DIVERTICULITIS, COLON (ICD-562.11) ARTHRITIS (ICD-716.90) ANEMIA, IRON DEFICIENCY, MICROCYTIC (ICD-280.8) THROMBOCYTOSIS (ICD-289.9) LEUKOCYTOSIS  (ICD-288.60) ESOPHAGEAL REFLUX (ICD-530.81) PHARYNGITIS, CHRONIC (ICD-472.1) ALLERGIC RHINITIS (ICD-477.9)- Dr [place] Callas ASTHMATIC BRONCHITIS, ACUTE (ICD-466.0)  Past Surgical History: Last updated: 07/17/2008 Partial colectomy for diverticulitis R hip replacement tonsils hysterectomy  thyroidectomy appendectomy subglottal mucocoel craniotomy meningioma resected 2002  Family History: Last updated: 01-25-09 Fatrher- died [place] Mother- died old age 58 Twin sister  Social History: Last updated: 05/27/2009 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    Vital Signs:  Patient profile:   77 year old female Height:      63 inches Weight:      125.13 pounds BMI:     22.25 O2 Sat:      98 % on Room air Pulse rate:   78 / minute BP sitting:   158 / 78  (left arm) Cuff size:   regular  Vitals Entered By: [name] CMA (February 27, 2010 9:44 AM)  O2 Flow:  Room air CC: 6 month follow up visit-doing great and no wheezing or SOB. Comments B/P rechecked before pt. left.Still elevated, now 142/76.Norvasc taken prior to visit.Pt. aware.Elray Buba RN  February 27, 2010 10:24 AM    Physical Exam  Additional Exam:  General: A/Ox3; pleasant and cooperative, NAD, SKIN: no rash, lesions NODES: no lymphadenopathy HEENT: /AT, EOM- WNL, Conjuctivae- clear, PERRLA, TM-WNL, Nose- clear, Throat- reddish without exudate, Mallampati  III, hoarse w/o  stridor NECK: Supple w/ fair ROM, JVD- none, normal carotid impulses w/o bruits Thyroid- ,  CHEST:clear to P&A HEART: RRR, no m/g/r heard ABDOMEN:soft ZOX:WRUE, nl pulses, no edema  NEURO: Grossly intact to observation      Impression & Recommendations:  Problem # 1:  BRONCHITIS, CHRONIC (ICD-491.9)  Now, while on increased prednisone (for ulcerative colitis), her breathing is doing very well.  She requests to continue following here for her lungs. She is certainly aware of potential steroid side effects. She has had flu vax. She will continue Flovent, with combivent as a rescue inhaler.   Problem # 2:  HOARSENESS, CHRONIC (ICD-784.42) Has seen ENT. We again reviewed mouth care. Consider a spacer if hoarseness seems related to inhaled steroid.   Problem # 3:  NONSPCIFC ABN FINDING RAD & OTH EXAM LUNG FIELD (ICD-793.1) CXR from April, 2011 was reviewed. COPD and chronic changes, but NAD.  Medications Added to Medication List This Visit: 1)  Prednisone 20 Mg Tabs (Prednisone) .... Take 1 by mouth once daily 2)  Mmw  .... As needed oral thrush  Other Orders: Est. Patient Level III (45409)  Patient Instructions: 1)  Please schedule a follow-up appointment in 1 year. 2)  Please call sooner if you need me.

## 2010-06-10 NOTE — Progress Notes (Signed)
Summary: Avelox rx sent to pharmacy  Phone Note Other Incoming Call back at [phone]   Caller: [facility] Summary of Call: Pts husband called stating that they wanted the results of culture-i told him this can take up to 2 weeks as we send all those out-states pt is still having cough-yellow, lots of congestion and not getting any better. Would like another abx called in-NOT ZPAK-didnt help this past time.    [facility]. Initial call taken by: [name] CMA,  April 15, 2010 4:39 PM  Follow-up for Phone Call        dr young pls advise if you want to give antibiotic?  Philipp Deputy [facility]  April 15, 2010 4:54 PM     New/Updated Medications: AVELOX 400 MG TABS (MOXIFLOXACIN HCL) 1 once daily Prescriptions: AVELOX 400 MG TABS (MOXIFLOXACIN HCL) 1 once daily  #10 x 0   Entered by:   Renold Genta RCP, LPN   Authorized by:   Waymon Budge MD   Signed by:   Renold Genta RCP, LPN on 81/19/1478   Method used:   Electronically to        [id number]* (retail)       [address]       [place], [place]  [address]       Ph: [phone]       Fax: [phone]   RxID:   [id number]

## 2010-06-10 NOTE — Assessment & Plan Note (Signed)
Summary: rov 4 months///kp   Primary Provider/Referring Provider:  [name] PMD, Dr [place] Callas Allergy, Dr Markeda Narvaez Asthma, Dr Claria Dice GI  CC:  follow-up visit.  Pt states breathing is doing "okay."  states she does have SOB when going upstairs or when working outside.  States she has occ chest congestion and nonprod cough that resolves by itself.  Denies wheezing and chest tighness.Marland Kitchen  History of Present Illness:  May 08, 2009- Asthmatic bronchitis, allergic rhinitis (DR [place] Callas), Ulcerative colitis Visiting daughter in [place] over holiday, had asthma she blames on that old house with dust, dry heat, large dog. Went to ER in [place] 3 days ago for neb, Steroid, CXR. Then went to [facility] ER last night for repeat. She doesn't feel Proair works as weel as her usual Combivent. She is now on pred taper, using 10 mg tabs, currently 40 mg. No fever or obvious infection. She reports both CXRs were clear. She now has xopenenex neb solution. She has a neb, but hasn't used it in a long time.  May 27, 2009--Presents for post hosp follow up - pt states her breathing is doing well,  would like a script for ativan qhs 0.5mg  to help her sleep-states was given this in the hosp.Acute exacerbation of asthma. Admitted 1/4-1/10/11 for AECOPD, CT chest was neg for PE.  Tx with aggressive pulmonary hygiene with nebs and IV steroids. Had received abx prior to admit. She was discharged on steroid taper. Feeling much better since discharge. Denies chest pain,  orthopnea, hemoptysis, fever, n/v/d, edema, headache.  June 19, 2009- Asthmatic bronchits, allergic rhinitis (Dr [place] Callas), Ulcerative Colitis.....husband here Post hospital visit. Feels very much better. She still feels sweak, easily tired. Has no cough/ sputum and hasn't needed to use her nebulizer. Has had esophageal spasms relieved by eating. We reviewed her normal PFT from August 2010.   August 29, 2009- Asthmatic bronchitis, allergic rhinitis (/dr  [place] Callas), Ulcerative colitis Near baseline. needed neb only once since last here. Morning cough small plug mucus, then fine. Some DOE on stairs and working outside, but denies wheeze, chest pain, palpitation, swelling. Uses spacer with Flovent, remains on prednisone for adrenal insufficiency. Can't stay clear of thrush.   Current Medications (verified): 1)  Flovent Hfa 110 Mcg/act  Aero (Fluticasone Propionate  Hfa) .... Use Two Puffs Every 12 Hours 2)  Prednisone 5 Mg  Tabs (Prednisone) .... Take 1 Tablet By Mouth Once A Day 3)  Combivent 103-18 Mcg/act Aero (Ipratropium-Albuterol) .... 2 Puffs Four Times A Day As Needed 4)  Norvasc 5 Mg  Tabs (Amlodipine Besylate) .... Take 1/2 Tablet By Mouth Once Daily 5)  One-A-Day Womens Formula  Tabs (Multiple Vitamins-Calcium) .... Take 1 By Mouth Once Daily 6)  Citracal/vitamin D 250-200 Mg-Unit Tabs (Calcium Citrate-Vitamin D) .... 2 Tabs By Mouth Once Daily 7)  B Complex   Tabs (B Complex Vitamins) .... Take 1 Tablet By Mouth Once A Day 8)  Synthroid 75 Mcg  Tabs (Levothyroxine Sodium) .... Take 1 Tablet By Mouth Once A Day 9)  Prilosec 20 Mg  Cpdr (Omeprazole) .... Take 1 Tablet By Mouth Once A Day 10)  Zocor 20 Mg  Tabs (Simvastatin) .... Take 1 Tab By Mouth At Bedtime  **on Hold Until End of April 11)  Vitamin D 2000 Unit Tabs (Cholecalciferol) .... Take 1 By Mouth Once Daily 12)  Trimethoprim 100 Mg Tabs (Trimethoprim) .... 1/2 Tablet Daily 13)  Aerochamber Spacer .... Use With Flovent 14)  Dukes Magic  Mouthwashj .Marland Kitchen.. 1 Teaspoon Rinse and Swallow Four Times A Day As Needed 15)  Xopenex 1.25 Mg/80ml Nebu (Levalbuterol Hcl) .Marland Kitchen.. 1 Neb Four Times A Day As Needed 16)  Allegra 180 Mg Tabs (Fexofenadine Hcl) .... 1/2 - 1 Once Daily As Needed 17)  Ativan 0.5 Mg Tabs (Lorazepam) .... 1/2-1 By Mouth Once Daily As Needed Anxiety 18)  Fish Oil 1200 Mg Caps (Omega-3 Fatty Acids) .... Three Times A Day  Allergies (verified): 1)  ! Sulfa 2)  ! Dilantin 3)   ! Cipro 4)  ! Codeine 5)  ! * Asacol Mesalamine  Past History:  Past Medical History: Last updated: 05/08/2009 ULCERATIVE COLITIS >dx June 2010 after hematochezia. STarted on asacol November 08, 2008 HYPERTENSION (ICD-401.9) HYPERLIPIDEMIA (ICD-272.4) CARDIAC MURMUR (ICD-785.2) DIVERTICULITIS, COLON (ICD-562.11) ARTHRITIS (ICD-716.90) ANEMIA, IRON DEFICIENCY, MICROCYTIC (ICD-280.8) THROMBOCYTOSIS (ICD-289.9) LEUKOCYTOSIS (ICD-288.60) ESOPHAGEAL REFLUX (ICD-530.81) PHARYNGITIS, CHRONIC (ICD-472.1) ALLERGIC RHINITIS (ICD-477.9)- Dr [place] Callas ASTHMATIC BRONCHITIS, ACUTE (ICD-466.0)  Past Surgical History: Last updated: 07/17/2008 Partial colectomy for diverticulitis R hip replacement tonsils hysterectomy  thyroidectomy appendectomy subglottal mucocoel craniotomy meningioma resected 2002  Family History: Last updated: Jan 04, 2009 Fatrher- died [place] Mother- died old age 82 Twin sister  Social History: Last updated: 05/27/2009 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    Vital Signs:  Patient profile:   77 year old female Height:      63 inches Weight:      135.50 pounds BMI:     24.09 O2 Sat:      95 % on Room air Pulse rate:   83 / minute BP sitting:   122 / 66  (left arm) Cuff size:   regular  Vitals Entered By: [name] RN (August 29, 2009 10:18 AM)  O2 Flow:  Room air CC: follow-up visit.  Pt states breathing is doing "okay."  states she does have SOB when going upstairs or when working outside.  States she has occ chest congestion and nonprod cough that resolves by itself.   Denies wheezing and chest tighness. Comments Medications reviewed with patient Daytime contact number verified with patient. Crystal Jones RN  August 29, 2009 10:14 AM    Physical Exam  Additional Exam:  General: A/Ox3; pleasant and cooperative, NAD, SKIN: no rash, lesions NODES: no lymphadenopathy HEENT: [place]/AT, EOM- WNL, Conjuctivae- clear, PERRLA, TM-WNL, Nose- clear, Throat- thrush, Mallampati  III NECK: Supple w/ fair ROM, JVD- none, normal carotid impulses w/o bruits Thyroid- ,  CHEST: clear to P&A HEART: RRR, no m/g/r heard ABDOMEN: ZOX:WRUE, nl pulses, no edema  NEURO: Grossly intact to observation      Impression & Recommendations:  Problem # 1:  BRONCHITIS, CHRONIC (ICD-491.9)  She is near baseline. We will reduce Flovent as discussed below.  Problem # 2:  THRUSH (ICD-112.0)  We will test the bottom of her need for Flovent and may be able to switch to Flovent 44 eventually.  Medications Added to Medication List This Visit: 1)  Zocor 20 Mg Tabs (Simvastatin) .... Take 1 tab by mouth at bedtime  **on hold until end of april 2)  Dukes Magic Mouthwashj  .Marland KitchenMarland Kitchen. 1 teaspoon rinse and  swallow four times a day as needed 3)  Fish Oil 1200 Mg Caps (Omega-3 fatty acids) .... Three times a day  Other Orders: Est. Patient Level III (45409)  Patient Instructions: 1)  Please schedule a follow-up appointment in 6 months. 2)  Try reducing Flovent to 1 puff and rinse twice daily.   Immunization History:  Pneumovax Immunization History:    Pneumovax:  historical (01/10/2004)

## 2010-06-10 NOTE — Progress Notes (Signed)
Summary: medication issue  Phone Note Call from Patient Call back at Home Phone [phone]   Caller: Spouse//clyde Call For: young Summary of Call: States pt was given a rx for avelox yesterday and he was researching on the computer and it says don't take if you're allergic to cipro (which she is), pls advise.//[facility] Initial call taken by: Darletta Moll,  April 16, 2010 8:31 AM  Follow-up for Phone Call        Pt reports that she had a rash from Cipro from the past.  Please advise. Abigail Miyamoto RN  April 16, 2010 9:19 AM   Additional Follow-up for Phone Call Additional follow up Details #1::        Per CDY-give patient Biaxin 500mg  #20 take 1 by mouth two times a day after a meal no refills.[name] CMA  April 16, 2010 11:44 AM     Additional Follow-up for Phone Call Additional follow up Details #2::    Spoke with pt.  She reports that she can barely catch her breath.  Very SOB.  Using [facility] Albuterol four times a day.  Pt wants to know if she should come in or try ABX first .  Please advise. Abigail Miyamoto RN  April 16, 2010 11:53 AM    Pt informed that Dr Maple [place] wanted her to try  the Biaxin first and call if symptoms do not improve. Abigail Miyamoto RN  April 16, 2010 1:17 PM   New/Updated Medications: BIAXIN 500 MG TABS (CLARITHROMYCIN) Take one by mouth two times a day after a meal Prescriptions: BIAXIN 500 MG TABS (CLARITHROMYCIN) Take one by mouth two times a day after a meal  #20 x 0   Entered by:   Abigail Miyamoto RN   Authorized by:   Waymon Budge MD   Signed by:   Abigail Miyamoto RN on 04/16/2010   Method used:   Electronically to        [id number] (retail)       [address]       [place], [place]  [address]       Ph: [phone]       Fax: [phone]   RxID:   [id number]

## 2010-06-10 NOTE — Progress Notes (Signed)
Summary: cxr results  Phone Note Call from Patient   Caller: Sierra [name] Call For: CY Summary of Call: Pt husband states that they are waiting on CXR results. I advised I will send a request to Dr. Maple Hudson. Please advise on CXR results. Results printed with message.  Initial call taken by: Carron Curie CMA,  April 18, 2010 4:42 PM  Follow-up for Phone Call        OK- I spoke to [name] to call. Follow-up by: Waymon Budge MD,  April 18, 2010 5:00 PM  Additional Follow-up for Phone Call Additional follow up Details #1::        Spoke with [name]-pts spouse-aware of CXR results-no acute process per CY.[name] CMA  April 18, 2010 5:25 PM

## 2010-06-10 NOTE — Progress Notes (Signed)
Summary: still sick  Phone Note Call from Patient Call back at [facility] Phone [phone]   Caller: Patient Call For: young Reason for Call: Talk to Nurse Summary of Call: Patient started zpack Sat. she is still coughing - yellow phlegm.  Patient is wanting to know if she needs to come in to see Dr. Maple Hudson or try something else. Initial call taken by: Lehman Prom,  April 09, 2010 8:16 AM  Follow-up for Phone Call        Patient will finish Zpak today. She says her cough has improved but she is not well. She denies any fever, sob or sore throat. Cough is more prod in the mornings with white, foamy sputum. She has not tried the Robitussin DM which Dr. Vassie Loll had recommended. She would like recs from [facility].Michel Bickers [facility]  April 09, 2010 9:33 AM  Additional Follow-up for Phone Call Additional follow up Details #1::        Per [facility]- pt to come in for OV. Pt will be here at 845am for appt with [facility]-has breast appt at 10am.Katie [facility] CMA  April 09, 2010 12:43 PM

## 2010-06-10 NOTE — Progress Notes (Signed)
Summary: rx or appt  Phone Note Call from Patient Call back at Home Phone [phone]   Caller: Patient Call For: Skarlette Lattner Reason for Call: Talk to Nurse Summary of Call: up all night, coughing and wheezing, not any better, going into 2nd week, using nebulizer.  Please advise Initial call taken by: Eugene Gavia,  May 14, 2009 8:47 AM  Follow-up for Phone Call        pt c/o productive cough, not sure ofthe color of the phlegm, wheezing, chest congestion "going on 2 weeks". Pt has been using mucinex and nebs with no relief. Please advise. Allergies: sulfa, dilantin, cipro, codeine, asacol mesalamine.  Carron Curie CMA  May 14, 2009 9:33 AM  [facility]  Additional Follow-up for Phone Call Additional follow up Details #1::        I called her. she admits she is scared, laboring for breath. As per recent office note, she has had prednisone, antibiotics, CXR's. She now sounds quited dyspneic over phone, meaning DDX needs to exclude additional dx, incl PE, PTX, cardiac. I ave told her to go to ER for eval and they will likely admit her. Additional Follow-up by: Waymon Budge MD,  May 14, 2009 12:54 PM

## 2010-06-10 NOTE — Progress Notes (Signed)
Summary: still sick  Phone Note Call from Patient Call back at [facility] Phone [phone] Call back at [phone]   Caller: Patient Call For: young Reason for Call: Talk to Nurse Summary of Call: finished antibiotic, prednisone, not better.  Was seen last week, still coughing wheezing and rattling.  Please advise. Initial call taken by: Eugene Gavia,  Sep 09, 2009 11:46 AM  Follow-up for Phone Call        Spoke withpt.  Pt states she is now back on 1 prednisone once daily as of today and finished doxy yesterday.  States she has not seen much improvement - still having prod cough with green mucus, wheezing, and rattling.  OK to come in today at 245 to see CY - ov scheduled - pt aware. [name] RN  Sep 09, 2009 12:16 PM

## 2010-06-10 NOTE — Assessment & Plan Note (Signed)
Summary: NP follow up - post hospital    Primary Provider/Referring Provider:  [name] PMD, Dr [place] Callas Allergy, Dr young Asthma, Dr Claria Dice GI  CC:  post hosp follow up - pt states her breathing is doing well and would like a script for ativan qhs 0.5mg  .  History of Present Illness: 12/31/08- Asthmatic bronchitis, allergic rhinitis ([facility]), Ulcerative colitis Wheeze and cough seemed to begin with Asacol and began to improve off. Headache and chest pain resolved, but morning cough still needs some Combivent. Aware of a little mildew in house at vents- seems minor. Aware of chronic  murmur.   April 30, 2009- Asthmatic bronchitis, allergic rhinitis (Dr [place] Callas), Ulcerative Colitis Breathing has done well. Occasional mild asthma relieved with rescue inhaler. Scarf helps against cold. She continues maintenance prednisone 5 mg daily for arthritis and colitis, as well as Flovent 110. Very mild congestion this AM. Asks help for thrush- says diflucan doesn't work. Had flu vax.  May 08, 2009- Asthmatic bronchitis, allergic rhinitis (DR [place] Callas), Ulcerative colitis Visiting daughter in [place] over holiday, had asthma she blames on that old house with dust, dry heat, large dog. Went to ER in [place] 3 days ago for neb, Steroid, CXR. Then went to [facility] ER last night for repeat. She doesn't feel Proair works as weel as her usual Combivent. She is now on pred taper, using 10 mg tabs, currently 40 mg. No fever or obvious infection. She reports both CXRs were clear. She now has xopenenex neb solution. She has a neb, but hasn't used it in a long time.  May 27, 2009--Presents for post hosp follow up - pt states her breathing is doing well,  would like a script for ativan qhs 0.5mg  to help her sleep-states was given this in the hosp.Acute exacerbation of asthma. Admitted 1/4-1/10/11 for AECOPD, CT chest was neg for PE.  Tx with aggressive pulmonary hygiene with nebs and IV steroids. Had  received abx prior to admit. She was discharged on steroid taper. Feeling much better since discharge. Denies chest pain,  orthopnea, hemoptysis, fever, n/v/d, edema, headache.  Medications Prior to Update: 1)  Flovent Hfa 110 Mcg/act  Aero (Fluticasone Propionate  Hfa) .... Use Two Puffs Every 12 Hours 2)  Prednisone 5 Mg  Tabs (Prednisone) .... Take 1 Tablet By Mouth Once A Day 3)  Combivent 103-18 Mcg/act Aero (Ipratropium-Albuterol) .... 2 Puffs Four Times A Day As Needed 4)  Norvasc 5 Mg  Tabs (Amlodipine Besylate) .... Take 1/2 Tablet By Mouth Once Daily 5)  One-A-Day Womens Formula  Tabs (Multiple Vitamins-Calcium) .... Take 1 By Mouth Once Daily 6)  Citracal Plus  Tabs (Multiple Minerals-Vitamins) .... Once Daily 7)  B Complex   Tabs (B Complex Vitamins) .... Take 1 Tablet By Mouth Once A Day 8)  Hydrochlorothiazide 12.5 Mg  Tabs (Hydrochlorothiazide) .... Take 1 Tablet By Mouth Once A Day As Needed 9)  Synthroid 75 Mcg  Tabs (Levothyroxine Sodium) .... Take 1 Tablet By Mouth Once A Day 10)  Prilosec 20 Mg  Cpdr (Omeprazole) .... Take 1 Tablet By Mouth Once A Day 11)  Zocor 20 Mg  Tabs (Simvastatin) .... Take 1 Tab By Mouth At Bedtime 12)  Vitamin D 2000 Unit Tabs (Cholecalciferol) .... Take 1 By Mouth Once Daily 13)  Trimethoprim 100 Mg Tabs (Trimethoprim) .... 1/2 Tablet Daily 14)  Aerochamber Spacer .... Use With Flovent 15)  Dukes Magic Mouthwashj .Marland Kitchen.. 1 Teaspoon Rinse and Swallow Four Times A Day 16)  Proair Hfa 108 (90 Base) Mcg/act Aers (Albuterol Sulfate) .... 2 Puffs Four Times A Day As Needed 17)  Xopenex 1.25 Mg/30ml Nebu (Levalbuterol Hcl) .Marland Kitchen.. 1 Neb Four Times A Day As Needed 18)  Zithromax Z-Pak 250 Mg Tabs (Azithromycin) .... 2 Today Then One Daily  Current Medications (verified): 1)  Flovent Hfa 110 Mcg/act  Aero (Fluticasone Propionate  Hfa) .... Use Two Puffs Every 12 Hours 2)  Prednisone 5 Mg  Tabs (Prednisone) .... Take 1 Tablet By Mouth Once A Day 3)  Combivent  103-18 Mcg/act Aero (Ipratropium-Albuterol) .... 2 Puffs Four Times A Day As Needed 4)  Norvasc 5 Mg  Tabs (Amlodipine Besylate) .... Take 1/2 Tablet By Mouth Once Daily 5)  One-A-Day Womens Formula  Tabs (Multiple Vitamins-Calcium) .... Take 1 By Mouth Once Daily 6)  Calcium Carbonate-Vitamin D 600-400 Mg-Unit  Tabs (Calcium Carbonate-Vitamin D) .... Take 1 Tablet By Mouth Two Times A Day 7)  B Complex   Tabs (B Complex Vitamins) .... Take 1 Tablet By Mouth Once A Day 8)  Synthroid 75 Mcg  Tabs (Levothyroxine Sodium) .... Take 1 Tablet By Mouth Once A Day 9)  Prilosec 20 Mg  Cpdr (Omeprazole) .... Take 1 Tablet By Mouth Once A Day 10)  Zocor 20 Mg  Tabs (Simvastatin) .... Take 1 Tab By Mouth At Bedtime 11)  Vitamin D 2000 Unit Tabs (Cholecalciferol) .... Take 1 By Mouth Once Daily 12)  Trimethoprim 100 Mg Tabs (Trimethoprim) .... 1/2 Tablet Daily 13)  Aerochamber Spacer .... Use With Flovent 14)  Dukes Magic Mouthwashj .Marland Kitchen.. 1 Teaspoon Rinse and Swallow Four Times A Day 15)  Xopenex 1.25 Mg/66ml Nebu (Levalbuterol Hcl) .Marland Kitchen.. 1 Neb Four Times A Day As Needed 16)  Allegra 180 Mg Tabs (Fexofenadine Hcl) .... 1/2 - 1 Once Daily As Needed  Allergies (verified): 1)  ! Sulfa 2)  ! Dilantin 3)  ! Cipro 4)  ! Codeine 5)  ! * Asacol Mesalamine  Past History:  Past Medical History: Last updated: 05/08/2009 ULCERATIVE COLITIS >dx June 2010 after hematochezia. STarted on asacol November 08, 2008 HYPERTENSION (ICD-401.9) HYPERLIPIDEMIA (ICD-272.4) CARDIAC MURMUR (ICD-785.2) DIVERTICULITIS, COLON (ICD-562.11) ARTHRITIS (ICD-716.90) ANEMIA, IRON DEFICIENCY, MICROCYTIC (ICD-280.8) THROMBOCYTOSIS (ICD-289.9) LEUKOCYTOSIS (ICD-288.60) ESOPHAGEAL REFLUX (ICD-530.81) PHARYNGITIS, CHRONIC (ICD-472.1) ALLERGIC RHINITIS (ICD-477.9)- Dr [place] Callas ASTHMATIC BRONCHITIS, ACUTE (ICD-466.0)  Past Surgical History: Last updated: 07/17/2008 Partial colectomy for diverticulitis R hip  replacement tonsils hysterectomy  thyroidectomy appendectomy subglottal mucocoel craniotomy meningioma resected 2002  Family History: Last updated: 01/19/2009 Fatrher- died [place] Mother- died old age 59 Twin sister  Social History: Last updated: 05/27/2009 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 Yes Comments Medications reviewed with patient Daytime contact number verified with patient. Boone Master CNA  May 27, 2009 2:54 PM    Physical Exam  Additional Exam:  General: A/Ox3; pleasant and cooperative, NAD, SKIN: no rash, lesions NODES: no lymphadenopathy HEENT: [place]/AT, EOM- WNL, Conjuctivae- clear, PERRLA, TM-WNL, Nose- clear, Throat- , melampatti II NECK: Supple w/ fair ROM, JVD- none, normal carotid impulses w/o bruits Thyroid- normal to palpation, a little hoarse CHEST: CTA bilaterally HEART: RRR, no m/g/r heard ABDOMEN: ZOX:WRUE, nl pulses, no edema  NEURO: Grossly intact to observation      Impression & Recommendations:  Problem # 1:  ASTHMATIC BRONCHITIS, ACUTE (ICD-466.0)  Recent exacerbation requiring hospitalization Now improved  with abx, steroids and pulmoanry hygiene.  REC:  Stay on Prednisone 10mg  once daily  follow up Dr. Maple Hudson in 3 weeks May use 1/2-1 Ativan 0.5mg  once daily as needed anxiety or at night for trouble sleeping .  Please contact office for sooner follow up if symptoms do not improve or worsen  The following medications were removed from the medication list:    Proair Hfa 108 (90 Base) Mcg/act Aers (Albuterol sulfate) .Marland Kitchen... 2 puffs four times a day as needed    Zithromax Z-pak 250 Mg Tabs (Azithromycin) .Marland Kitchen... 2 today then one daily Her updated medication list for this problem includes:    Flovent Hfa 110 Mcg/act Aero (Fluticasone propionate  hfa) ..... Use two puffs every 12 hours    Combivent 103-18 Mcg/act Aero (Ipratropium-albuterol) .Marland Kitchen... 2 puffs four times a day as needed    Trimethoprim 100 Mg Tabs (Trimethoprim) .Marland Kitchen... 1/2 tablet daily    Xopenex 1.25 Mg/25ml Nebu (Levalbuterol hcl) .Marland Kitchen... 1 neb four times a day as needed  Orders: Est. Patient Level III (45409)  Medications Added to Medication List This Visit: 1)  Calcium Carbonate-vitamin D 600-400 Mg-unit Tabs (Calcium carbonate-vitamin d) .... Take 1 tablet by mouth two times a day 2)  Allegra 180 Mg Tabs (Fexofenadine hcl) .... 1/2 - 1 once daily as needed 3)  Ativan 0.5 Mg Tabs (Lorazepam) .... 1/2-1 by mouth once daily as needed anxiety  Complete Medication List: 1)  Flovent Hfa 110 Mcg/act Aero (Fluticasone propionate  hfa) .... Use two puffs every 12 hours 2)  Prednisone 5 Mg Tabs (Prednisone) .... Take 1 tablet by mouth once a day 3)  Combivent 103-18 Mcg/act Aero (Ipratropium-albuterol) .... 2 puffs four times a day as needed 4)  Norvasc 5 Mg Tabs (Amlodipine besylate) .... Take 1/2 tablet by mouth once daily 5)  One-a-day Womens Formula Tabs (Multiple vitamins-calcium) .... Take 1 by mouth once daily 6)  Calcium Carbonate-vitamin D 600-400 Mg-unit Tabs (Calcium carbonate-vitamin d) .... Take 1 tablet by mouth two times a day 7)   B Complex Tabs (B complex vitamins) .... Take 1 tablet by mouth once a day 8)  Synthroid 75 Mcg Tabs (Levothyroxine sodium) .... Take 1 tablet by mouth once a day 9)  Prilosec 20 Mg Cpdr (Omeprazole) .... Take 1 tablet by mouth once a day 10)  Zocor 20 Mg Tabs (Simvastatin) .... Take 1 tab by mouth at bedtime 11)  Vitamin D 2000 Unit Tabs (Cholecalciferol) .... Take 1 by mouth once daily 12)  Trimethoprim 100 Mg Tabs (Trimethoprim) .... 1/2 tablet daily 13)  Aerochamber  Spacer  .... Use with flovent 14)  Dukes Magic Mouthwashj  .Marland KitchenMarland Kitchen. 1 teaspoon rinse and swallow four times a day 15)  Xopenex 1.25 Mg/55ml Nebu (Levalbuterol hcl) .Marland Kitchen.. 1 neb four times a day as needed 16)  Allegra 180 Mg Tabs (Fexofenadine hcl) .... 1/2 - 1 once daily as needed 17)  Ativan 0.5 Mg Tabs (Lorazepam) .... 1/2-1 by mouth once daily as needed anxiety  Patient Instructions: 1)  Stay on Prednisone 10mg  once daily  2)  follow up Dr. Maple Hudson in 3 weeks 3)  May use 1/2-1 Ativan 0.5mg  once daily as needed anxiety or at night for trouble sleeping .  4)  Please contact office for sooner follow up if symptoms do not improve or worsen  Prescriptions: ATIVAN 0.5 MG TABS (LORAZEPAM) 1/2-1 by mouth once daily as needed anxiety  #30 x 0   Entered and Authorized by:   Rubye Oaks NP   Signed by:   Yanilen Adamik NP on 05/27/2009   Method used:   Print then Give to Patient   RxID:   [id number]    Immunization History:  Influenza Immunization History:    Influenza:  historical (02/08/2009)

## 2010-06-10 NOTE — Assessment & Plan Note (Signed)
Summary: 2 months/ mbw   Primary Provider/Referring Provider:  [name] PMD, Dr [place] Callas Allergy, Dr young Asthma, Dr Claria Dice GI  CC:  2 month follow up visit-slight cough and congestion at times but normally goes away..  History of Present Illness:  August 29, 2009- Asthmatic bronchitis, allergic rhinitis (/dr [place] Callas), Ulcerative colitis Near baseline. needed neb only once since last here. Morning cough small plug mucus, then fine. Some DOE on stairs and working outside, but denies wheeze, chest pain, palpitation, swelling. Uses spacer with Flovent, remains on prednisone for adrenal insufficiency. Can't stay clear of thrush.  September 02, 2009-  Was doing much better when here last week, but that afternmoon she got sore throat and progressed to deep chest congestion with no fever, little sputum. Needed nebulizer through the weekend. She was afraid she was getting pneumonia. She didn't recognize a reflux event.  Sep 09, 2009- Asthmatic bronchitis, allergic rhinitis/ Dr [place] Callas, Maurene Capes Colitis She has remained congested , coughing and wheezing. This has been going on almost back to Christmas.. She got worse yesterday and didn't go to church. She used the nebulizer last week, but didn't like the stimulation. Coughing up some greenish pink mucus with no fever. Tussive anterior chest wall soreness.  Finishing antibiotic and prednisone taper- back to maintenance prednisone 5 mg daily. CXR- 09/02/09- Stable CE, COPD changes, but NAD. PFT 12/2008- WNL  November 05, 2009- Asthmatic bronchitis, allergic rhinitis/ Dr [place] Callas, Ulcerative colitis Doing well. Admits some raspy voice. Remote hx vocal cord nodule- Dr Haroldine Laws. More recently seeing Dr Lazarus Salines for ears.. Feels heartburn if leaves prilosec off, but never skips it. Had good trip to [place]- had  been concerned but had no problems this trip and has not needed to take more than her routine meds. CXR- stable, NAD no spots, mild CE  Preventive  Screening-Counseling & Management  Alcohol-Tobacco     Smoking Status: never  Current Medications (verified): 1)  Flovent Hfa 110 Mcg/act  Aero (Fluticasone Propionate  Hfa) .... Use Two Puffs Every 12 Hours 2)  Combivent 103-18 Mcg/act Aero (Ipratropium-Albuterol) .... 2 Puffs Four Times A Day As Needed 3)  Norvasc 5 Mg  Tabs (Amlodipine Besylate) .... Take 1/2 Tablet By Mouth Once Daily 4)  One-A-Day Womens Formula  Tabs (Multiple Vitamins-Calcium) .... Take 1 By Mouth Once Daily 5)  Citracal/vitamin D 250-200 Mg-Unit Tabs (Calcium Citrate-Vitamin D) .... 2 Tabs By Mouth Once Daily 6)  B Complex   Tabs (B Complex Vitamins) .... Take 1 Tablet By Mouth Once A Day 7)  Synthroid 75 Mcg  Tabs (Levothyroxine Sodium) .... Take 1 Tablet By Mouth Once A Day 8)  Prilosec 20 Mg  Cpdr (Omeprazole) .... Take 1 Tablet By Mouth Once A Day 9)  Zocor 20 Mg  Tabs (Simvastatin) .... Take 1 Tab By Mouth At Bedtime  **on Hold Until End of April 10)  Vitamin D 2000 Unit Tabs (Cholecalciferol) .... Take 1 By Mouth Once Daily 11)  Trimethoprim 100 Mg Tabs (Trimethoprim) .... 1/2 Tablet Daily 12)  Aerochamber Spacer .... Use With Flovent 13)  Albuterol Sulfate (2.5 Mg/32ml) 0.083% Nebu (Albuterol Sulfate) .Marland Kitchen.. 1 Vial Four Times A Day As Needed 14)  Allegra 180 Mg Tabs (Fexofenadine Hcl) .... 1/2 - 1 Once Daily As Needed 15)  Ativan 0.5 Mg Tabs (Lorazepam) .... 1/2-1 By Mouth Once Daily As Needed Anxiety 16)  Fish Oil 1000 Mg Caps (Omega-3 Fatty Acids) .... Take 4 By Mouth Once Daily 17)  Prednisone  5 Mg Tabs (Prednisone) .... Take 1 By Mouth Once Daily 18)  Citracal Plus  Tabs (Multiple Minerals-Vitamins) .... Take 2 By Mouth Once Daily  Allergies (verified): 1)  ! Sulfa 2)  ! Dilantin 3)  ! Cipro 4)  ! Codeine 5)  ! * Asacol Mesalamine  Past History:  Past Medical History: Last updated: 05/08/2009 ULCERATIVE COLITIS >dx June 2010 after hematochezia. STarted on asacol November 08, 2008 HYPERTENSION  (ICD-401.9) HYPERLIPIDEMIA (ICD-272.4) CARDIAC MURMUR (ICD-785.2) DIVERTICULITIS, COLON (ICD-562.11) ARTHRITIS (ICD-716.90) ANEMIA, IRON DEFICIENCY, MICROCYTIC (ICD-280.8) THROMBOCYTOSIS (ICD-289.9) LEUKOCYTOSIS (ICD-288.60) ESOPHAGEAL REFLUX (ICD-530.81) PHARYNGITIS, CHRONIC (ICD-472.1) ALLERGIC RHINITIS (ICD-477.9)- Dr [place] Callas ASTHMATIC BRONCHITIS, ACUTE (ICD-466.0)  Past Surgical History: Last updated: 07/17/2008 Partial colectomy for diverticulitis R hip replacement tonsils hysterectomy  thyroidectomy appendectomy subglottal mucocoel craniotomy meningioma resected 2002  Family History: Last updated: 01-08-09 Fatrher- died [place] Mother- died old age 57 Twin sister  Social History: Last updated: 05/27/2009 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  The patient denies shortness of breath with activity, shortness of breath at rest, productive cough, non-productive cough, coughing up blood, chest pain, irregular heartbeats, indigestion, loss of appetite, weight change, abdominal pain, difficulty swallowing, sore throat, tooth/dental problems, headaches, nasal congestion/difficulty breathing through nose, and sneezing.    Vital Signs:  Patient profile:   77 year old female Height:      63 inches Weight:      136 pounds BMI:     24.18 O2 Sat:      95 % on Room air Pulse rate:   85 / minute BP sitting:   124 / 64  (left arm) Cuff size:   regular  Vitals Entered By: [name] CMA (November 05, 2009 11:25 AM)  O2 Flow:  Room air CC: 2 month follow up visit-slight cough and congestion at times but normally goes away.   Physical Exam  Additional Exam:  General: A/Ox3; pleasant and cooperative, NAD, SKIN: no rash, lesions NODES: no  lymphadenopathy HEENT: /AT, EOM- WNL, Conjuctivae- clear, PERRLA, TM-WNL, Nose- clear, Throat- reddish without exudate, Mallampati  III, hoarse w/o stridor NECK: Supple w/ fair ROM, JVD- none, normal carotid impulses w/o bruits Thyroid- ,  CHEST:Coarsely congested wheezey cough HEART: RRR, no m/g/r heard ABDOMEN:soft VZD:GLOV, nl pulses, no edema  NEURO: Grossly intact to observation      Impression & Recommendations:  Problem # 1:  BRONCHITIS, CHRONIC (ICD-491.9)  Flovent helps.  Not needing recent combivent. Good control  Problem # 2:  ESOPHAGEAL REFLUX (ICD-530.81)  I am suspicious that her persistent hoarsenmess may reflect occult reflux, but she does have hx of vocal cord polyps. I suggested she ask Dr Lazarus Salines to look. Her updated medication list for this problem includes:    Prilosec 20 Mg Cpdr (Omeprazole) .Marland Kitchen... Take 1 tablet by mouth once a day  Problem # 3:  HOARSENESS, CHRONIC (ICD-784.42)  Consider effect of steroid inhaler- she can stop to observe effect for a few weeks. Cder hx of vocal cord polyp- ? ENT Continue reflux precautions.  Orders: Est. Patient Level IV (56433)  Medications Added to Medication List This Visit: 1)  Albuterol Sulfate (2.5 Mg/72ml) 0.083% Nebu (Albuterol sulfate) .Marland Kitchen.. 1 vial four times a day as needed 2)  Fish  Oil 1000 Mg Caps (Omega-3 fatty acids) .... Take 4 by mouth once daily 3)  Prednisone 5 Mg Tabs (Prednisone) .... Take 1 by mouth once daily 4)  Citracal Plus Tabs (Multiple minerals-vitamins) .... Take 2 by mouth once daily  Patient Instructions: 1)  Please schedule a follow-up appointment in 4 months. 2)  Consider asking Dr Lazarus Salines to look at your throat for persistent hoarseness. 3)  Try off Flovent for 2 weeks to see if that improves your noarseness.

## 2010-06-10 NOTE — Progress Notes (Signed)
Summary: speak to nurse  Phone Note Call from Patient Call back at Home Phone [phone]   Caller: Patient Call For: young Summary of Call: Pt states she saw CY on 12/1, says she's no better, coughing up green phlegm, no fever, pls advise.//[facility] [place] Initial call taken by: Darletta Moll,  April 14, 2010 8:30 AM  Follow-up for Phone Call        spoke with pt and she states that she saw CY on 04-10-10 but she does not see much improvement in her sympotoms. She still c/o chest congestion, productive cough with green phlegm. she is using her neb four times daiy and cough syrup as diercted. Please advsie .Carron Curie CMA  April 14, 2010 9:42 AM allergies: 1)  ! Sulfa 2)  ! Dilantin 3)  ! Cipro 4)  ! Codeine 5)  ! * Asacol Mesalamine  Additional Follow-up for Phone Call Additional follow up Details #1::        Rather than throw more antibiotics at her, i think we had best try to culture sputum. i will put in lab order.     Additional Follow-up for Phone Call Additional follow up Details #2::    Order already in EMR for sputum culture, spoke with pt and made aware of this.  Pt states that she will go ahead and come in the pm and leave sample.  Follow-up by: Vernie Murders,  April 14, 2010 3:14 PM

## 2010-06-10 NOTE — Letter (Signed)
Summary: [facility]  [facility]   Imported By: Sherian Rein 12/31/2009 11:32:30  _____________________________________________________________________  External Attachment:    Type:   Image     Comment:   External Document

## 2010-06-10 NOTE — Progress Notes (Signed)
Summary: On call- Z pak  Phone Note Call from Patient   Reason for Call: Acute Illness Complaint: Cough/Sore throat Action Taken: Rx Called In Summary of Call: head & chest cold - yellow phlegm azithro 500 x 5 ds called in to [facility], [place] Robitussin DM oTC as needed   Initial call taken by: Comer Locket. Vassie Loll MD,  April 05, 2010 6:50 PM    New/Updated Medications: AZITHROMYCIN 500 MG TABS (AZITHROMYCIN) once daily Prescriptions: AZITHROMYCIN 500 MG TABS (AZITHROMYCIN) once daily  #5 x 0   Entered and Authorized by:   Comer Locket Vassie Loll MD   Signed by:   Comer Locket Vassie Loll MD on 04/05/2010   Method used:   Electronically to        [id number]* (retail)       [address]       [place], [place]  [address]       Ph: [phone]       Fax: [phone]   RxID:   [id number]

## 2010-06-10 NOTE — Progress Notes (Signed)
Summary: weakness on prednisone  Phone Note Call from Patient Call back at Home Phone [phone]   Caller: Patient Call For: young Reason for Call: Talk to Nurse Summary of Call: pt thinks she is having reaction to prednisone taper - down to 1 - 10mg  per day.  Real weak, no strength, no energy.  Please advise. Initial call taken by: Eugene Gavia,  June 03, 2009 8:57 AM  Follow-up for Phone Call        The patient c/o increased weakness and says she has no energy. She is now taking 10mg  of Prednisone daily and was told to stay on this until seen in the office by CDY. She denies any trouble with her breathing and no asthma symptoms. The patient does want to know if she can reduce the Prednisone to the original 5mg  daily. The patient is scheduled for f/u with CDY on 06/19/09. Please advise. Follow-up by: Michel Bickers CMA,  June 03, 2009 9:15 AM  Additional Follow-up for Phone Call Additional follow up Details #1::        Per CDY- ok to reduce predisone to 1/2 tablet daily. [name] CMA  June 03, 2009 11:51 AM    The patient had verbal understanding that it is okay to cut Prednisone to 5 mg daily until her appt with CDY. She will call if her sxs do not improve or if they get worse.  Additional Follow-up by: Michel Bickers CMA,  June 03, 2009 11:58 AM

## 2010-06-10 NOTE — Assessment & Plan Note (Signed)
Summary: not much improvement / cj   Primary Provider/Referring Provider:  [name] PMD, Dr [place] Callas Allergy, Dr Craig Ionescu Asthma, Dr Claria Dice GI  CC:  ROV 1 week, not better at all, sob, and wheezing coughing.  History of Present Illness:  June 19, 2009- Asthmatic bronchits, allergic rhinitis (Dr Elderon Callas), Ulcerative Colitis.....husband here Post hospital visit. Feels very much better. She still feels sweak, easily tired. Has no cough/ sputum and hasn't needed to use her nebulizer. Has had esophageal spasms relieved by eating. We reviewed her normal PFT from August 2010.   August 29, 2009- Asthmatic bronchitis, allergic rhinitis (/dr Kanarraville Callas), Ulcerative colitis Near baseline. needed neb only once since last here. Morning cough small plug mucus, then fine. Some DOE on stairs and working outside, but denies wheeze, chest pain, palpitation, swelling. Uses spacer with Flovent, remains on prednisone for adrenal insufficiency. Can't stay clear of thrush.  September 02, 2009-  Was doing much better when here last week, but that afternmoon she got sore throat and progressed to deep chest congestion with no fever, little sputum. Needed nebulizer through the weekend. She was afraid she was getting pneumonia. She didn't recognize a reflux event.  Sep 09, 2009- Asthmatic bronchitis, allergic rhinitis/ Dr Washburn Callas, Maurene Capes Colitis She has remained congested , coughing and wheezing. This has been going on almost back to Christmas.. She got worse yesterday and didn't go to church. She used the nebulizer last week, but didn't like the stimulation. Coughing up some greenish pink mucus with no fever. Tussive anterior chest wall soreness.  Finishing antibiotic and prednisone taper- back to maintenance prednisone 5 mg daily. CXR- 09/02/09- Stable CE, COPD changes, but NAD. PFT 12/2008- WNL   Current Medications (verified): 1)  Flovent Hfa 110 Mcg/act  Aero (Fluticasone Propionate  Hfa) .... Use Two Puffs Every  12 Hours 2)  Prednisone 5 Mg  Tabs (Prednisone) .... Take 1 Tablet By Mouth Once A Day 3)  Combivent 103-18 Mcg/act Aero (Ipratropium-Albuterol) .... 2 Puffs Four Times A Day As Needed 4)  Norvasc 5 Mg  Tabs (Amlodipine Besylate) .... Take 1/2 Tablet By Mouth Once Daily 5)  One-A-Day Womens Formula  Tabs (Multiple Vitamins-Calcium) .... Take 1 By Mouth Once Daily 6)  Citracal/vitamin D 250-200 Mg-Unit Tabs (Calcium Citrate-Vitamin D) .... 2 Tabs By Mouth Once Daily 7)  B Complex   Tabs (B Complex Vitamins) .... Take 1 Tablet By Mouth Once A Day 8)  Synthroid 75 Mcg  Tabs (Levothyroxine Sodium) .... Take 1 Tablet By Mouth Once A Day 9)  Prilosec 20 Mg  Cpdr (Omeprazole) .... Take 1 Tablet By Mouth Once A Day 10)  Zocor 20 Mg  Tabs (Simvastatin) .... Take 1 Tab By Mouth At Bedtime  **on Hold Until End of April 11)  Vitamin D 2000 Unit Tabs (Cholecalciferol) .... Take 1 By Mouth Once Daily 12)  Trimethoprim 100 Mg Tabs (Trimethoprim) .... 1/2 Tablet Daily 13)  Aerochamber Spacer .... Use With Flovent 14)  Dukes Magic Mouthwashj .Marland Kitchen.. 1 Teaspoon Rinse and Swallow Four Times A Day As Needed 15)  Xopenex 1.25 Mg/56ml Nebu (Levalbuterol Hcl) .Marland Kitchen.. 1 Neb Four Times A Day As Needed 16)  Allegra 180 Mg Tabs (Fexofenadine Hcl) .... 1/2 - 1 Once Daily As Needed 17)  Ativan 0.5 Mg Tabs (Lorazepam) .... 1/2-1 By Mouth Once Daily As Needed Anxiety 18)  Fish Oil 1200 Mg Caps (Omega-3 Fatty Acids) .... Three Times A Day 19)  Doxycycline Hyclate 100 Mg Caps (Doxycycline  Hyclate) .... 2 Today Then One Daily  Allergies (verified): 1)  ! Sulfa 2)  ! Dilantin 3)  ! Cipro 4)  ! Codeine 5)  ! * Asacol Mesalamine  Past History:  Past Medical History: Last updated: 05/08/2009 ULCERATIVE COLITIS >dx June 2010 after hematochezia. STarted on asacol November 08, 2008 HYPERTENSION (ICD-401.9) HYPERLIPIDEMIA (ICD-272.4) CARDIAC MURMUR (ICD-785.2) DIVERTICULITIS, COLON (ICD-562.11) ARTHRITIS (ICD-716.90) ANEMIA, IRON  DEFICIENCY, MICROCYTIC (ICD-280.8) THROMBOCYTOSIS (ICD-289.9) LEUKOCYTOSIS (ICD-288.60) ESOPHAGEAL REFLUX (ICD-530.81) PHARYNGITIS, CHRONIC (ICD-472.1) ALLERGIC RHINITIS (ICD-477.9)- Dr Wales Callas ASTHMATIC BRONCHITIS, ACUTE (ICD-466.0)  Past Surgical History: Last updated: 07/17/2008 Partial colectomy for diverticulitis R hip replacement tonsils hysterectomy  thyroidectomy appendectomy subglottal mucocoel craniotomy meningioma resected 2002  Family History: Last updated: Jan 10, 2009 Fatrher- died MI Mother- died old age 75 Twin sister  Social History: Last updated: 05/27/2009 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  The patient denies anorexia, fever, weight loss, weight gain, vision loss, decreased hearing, hoarseness, chest pain, syncope, peripheral edema, headaches, hemoptysis, and severe indigestion/heartburn.    Vital Signs:  Patient profile:   77 year old female Height:      63 inches Weight:      135 pounds O2 Sat:      93 % on Room air Pulse rate:   103 / minute BP sitting:   162 / 84  (left arm) Cuff size:   small  Vitals Entered By: [name] CMA (Sep 09, 2009 3:15 PM)  O2 Sat at Rest %:  93 O2 Flow:  Room air CC: ROV 1 week, not better at all, sob, wheezing coughing Comments upon arrival pt BP was 162/84.Marland KitchenMarland Kitchenpatient had been coughing, I waited approx 5 minutes and retook her bp it is now 138/64.Marland KitchenReynaldo Minium CMA  Sep 09, 2009 3:18 PM    Physical Exam  Additional Exam:  General: A/Ox3; pleasant and cooperative, NAD, SKIN: no rash, lesions NODES: no lymphadenopathy HEENT: [place]/AT, EOM- WNL, Conjuctivae- clear, PERRLA, TM-WNL, Nose- clear, Throat- reddish without exudate, Mallampati  III NECK:  Supple w/ fair ROM, JVD- none, normal carotid impulses w/o bruits Thyroid- ,  CHEST:Coarsely congested wheezey cough HEART: RRR, no m/g/r heard ABDOMEN: UVO:ZDGU, nl pulses, no edema  NEURO: Grossly intact to observation      Impression & Recommendations:  Problem # 1:  ASTHMATIC BRONCHITIS, ACUTE (ICD-466.0)  Significantly congested with pattern of acute bronchitis. She doesn't have visible edema, but I will cover possibility of some fluid overload by gently diuresing for a few days. We will try a different antibiotic. The following medications were removed from the medication list:    Doxycycline Hyclate 100 Mg Caps (Doxycycline hyclate) .Marland Kitchen... 2 today then one daily Her updated medication list for this problem includes:    Flovent Hfa 110 Mcg/act Aero (Fluticasone propionate  hfa) ..... Use two puffs every 12 hours    Combivent 103-18 Mcg/act Aero (Ipratropium-albuterol) .Marland Kitchen... 2 puffs four times a day as needed    Trimethoprim 100 Mg Tabs (Trimethoprim) .Marland Kitchen... 1/2 tablet daily    Xopenex 1.25 Mg/30ml Nebu (Levalbuterol hcl) .Marland Kitchen... 1 neb four times a day as needed    Amoxicillin-pot Clavulanate 875-125 Mg Tabs (Amoxicillin-pot clavulanate) .Marland Kitchen... 1 two times a day  Medications  Added to Medication List This Visit: 1)  Amoxicillin-pot Clavulanate 875-125 Mg Tabs (Amoxicillin-pot clavulanate) .Marland Kitchen.. 1 two times a day 2)  Triamterene-hctz 37.5-25 Mg Tabs (Triamterene-hctz) .Marland Kitchen.. 1 daily -duretic- x 5 days  Other Orders: Est. Patient Level III ([address]) Prescription Created Electronically [id number]  Patient Instructions: 1)  Please schedule a follow-up appointment in 1 month. 2)  Script for augmentin antibiotic, diuretic and for Xopenex to [facility] 3)  Add back Flovent HFA 110, 2 puffs and rinse, twice daily 4)  may use Combivent up to 4 times daily as needed Prescriptions: TRIAMTERENE-HCTZ 37.5-25 MG TABS (TRIAMTERENE-HCTZ) 1 daily -duretic- x 5 days  #5 x 0   Entered and Authorized by:    Waymon Budge MD   Signed by:   Waymon Budge MD on 09/09/2009   Method used:   Electronically to        [facility] (retail)       [address]       [place], [place]  [address]       Ph: [phone] or [phone]       Fax: [phone]   RxID:   [id number] XOPENEX 1.25 MG/3ML NEBU (LEVALBUTEROL HCL) 1 neb four times a day as needed  #120 x prn   Entered and Authorized by:   Waymon Budge MD   Signed by:   Waymon Budge MD on 09/09/2009   Method used:   Electronically to        [facility]* (retail)       [address]       [place], [place]  [address]       Ph: [phone] or [phone]       Fax: [phone]   RxID:   [id number] AMOXICILLIN-POT CLAVULANATE 875-125 MG TABS (AMOXICILLIN-POT CLAVULANATE) 1 two times a day  #14 x 0   Entered and Authorized by:   Waymon Budge MD   Signed by:   Waymon Budge MD on 09/09/2009   Method used:   Electronically to        [facility]* (retail)       [address]       [place], [place]  [address]       Ph: [phone] or [phone]       Fax: [phone]   RxID:   [id number]

## 2010-06-11 ENCOUNTER — Institutional Professional Consult (permissible substitution) (INDEPENDENT_AMBULATORY_CARE_PROVIDER_SITE_OTHER): Payer: PRIVATE HEALTH INSURANCE | Admitting: Cardiology

## 2010-06-11 ENCOUNTER — Encounter: Payer: Self-pay | Admitting: Internal Medicine

## 2010-06-11 DIAGNOSIS — I119 Hypertensive heart disease without heart failure: Secondary | ICD-10-CM

## 2010-06-11 DIAGNOSIS — R0602 Shortness of breath: Secondary | ICD-10-CM

## 2010-06-11 DIAGNOSIS — R002 Palpitations: Secondary | ICD-10-CM

## 2010-06-12 NOTE — Progress Notes (Signed)
Summary: BP medication and Results from labs on Friday  Phone Note Call from Patient Call back at Home Phone [phone]   Caller: Spouse/clyde Call For: Dr. Maple Hudson Summary of Call: Pt's spouse phoned she was just released from the hospital last and seen her her BP was low and she was taken off of her BP medication. Today she is very week and they had blood drawn on Friday and they would like the results. Her BP this morning is 144/80 and her pulse is 98. Pt's spouse would like a call back with results from the blood work and also needs to know if she should go back on her BP medication. They can be reached at [phone] Initial call taken by: Vedia Coffer,  May 06, 2010 10:12 AM  Follow-up for Phone Call        called spoke with patient.  advised of lab results/recs as stated by CDY.  pt verbalized her understanding.  pt does state that she is doing "a little better everyday" but is still low on energy.  she states that she is still taking her vitamins and drinking a [employer] drink everyday, but wonders of CDY has any other recs to help her get her energy back.  i did advise pt that this is normal after a hospital stay.  pt also states that CDY dc'd her norvasc in the hosp and today her BP was 144/80 with a HR of 98.  would like to know if CDY would like her to restart the norvasc.    also, pt states that it was mentioned to her about a home health nurse but pt does not feel this is necessary.  pt states she is able to do her ADLs without any difficulty.  would like to know CDY's thoughts.  please advise, thanks! Follow-up by: Boone Master CNA/MA,  May 06, 2010 11:40 AM  Additional Follow-up for Phone Call Additional follow up Details #1::        Per CDY-can she split Norvasc and take 1/2 by mouth once daily  and ok to dc home health nurse.[name] CMA  May 06, 2010 12:17 PM   Called, spoke with pt.  She was informed to cut norvasc tablet in half and take  1/2 tablet by mouth once daily per CY.  She verbalized understanding of this.  States no home health nurse has been ordered yet -- states she spoke with someone this morning from [employer] [facility], and they mentioned this could be a possibility if she needed it.  States she does not feel she needs one at this time but will call back if she changes her mind.   Additional Follow-up by: [name] RN,  May 06, 2010 2:41 PM

## 2010-06-12 NOTE — Progress Notes (Signed)
Summary: pt not any better - OV scheduled  Phone Note Call from Patient Call back at Home Phone [phone]   Caller: Spouse--clyde Call For: young Reason for Call: Talk to Nurse Summary of Call: Patient's husband calling to report to Dr. Maple Hudson that wife does not feel any better.  Patient spoke to Dr. Maple Hudson last night and was told to call this am if not feeling better. Initial call taken by: Lehman Prom,  May 01, 2010 9:37 AM  Follow-up for Phone Call        Spoke with pt.  States she is "not much better."  Still lethargic, no energy, no appetite.    Dr. Maple Hudson, per phone note from yesterday, if not better overnight, she will call office in AM. I will need to be able to check pulse and BP then. Pls advise if you would like to work pt in today.  If not, pls advise recs.  Thanks! Follow-up by: [name] RN,  May 01, 2010 12:28 PM  Additional Follow-up for Phone Call Additional follow up Details #1::        Spoke with CY regarding this.  Per CY, work pt in today.  Pt scheduled for 4:15 today with CY -- she is aware of this appt.  Additional Follow-up by: [name] RN,  May 01, 2010 12:38 PM

## 2010-06-12 NOTE — Assessment & Plan Note (Signed)
Summary: per Zack Seal / cj   Primary Daylin Eads/Referring Brandy Kabat:  Jacky Kindle, Dr [place] Callas Allergy, Dr young Asthma, Dr Claria Dice GI  CC:  sick visit.  c/o weak, no energy, no appetite, and cough with white colored sputum.  denies n/v/d or fever.  stopped theophylline.  currently on pred taper. .  History of Present Illness:  April 10, 2010 Asthmatic bronchitis, allergic rhinitis/ Dr [place] Callas, Ulcerative colitis CC-Acute visit-deep chest congestion and cough-pinkish green in color for 4 days then clear now. Since last here had Z pak clled in Nov 26- finished yesterday. Current illness began with bad sore throat/ head cold before Thanksgiving. Still maxillary pressure, clear nasal discharge, pnd, residual cough and chest rattle bother her most. Denies fever, purulent discharge or GI upset. Up to date on flu and pneumovax.   April 17, 2010- Asthmatic bronchitis, allergic rhinitis/ Dr [place] Callas, Ulcerative colitis...Marland KitchenMarland KitchenMarland Kitchenhusband here Nurse-CC: Not getting better; cough-green, SOB, wheezing,light headed. We gave depo and neb here last week. We called Avelox, then learned allergic Cipro, changed her to Biaxin. Took 3 doses starting yesterday and today has pruritic rash on lower legs. Still harsh cough, productive green no fever. Doesn't feel reflux. Saw Dr Randa Evens today- he called [place] and we worked her in now.  Sputum cx- AFB smear neg. Routine flora- no obvious pathogen target.   May 01, 2010 Asthmatic bronchitis, allergic rhinitis/ Dr [place] Callas, Ulcerative colitis...Marland KitchenMarland KitchenMarland Kitchenhusband here Nurse-CC: sick visit.  c/o weak, no energy, no appetite, cough with white colored sputum.  denies n/v/d or fever.  stopped theophylline.  currently on pred taper.  Acute visit post hospital. She responded only slowly to iv meds in hosp.. She called last night c/o weak, poor appetite. Says breathing is not all that bad. Still deep cough, but less, not very productive. No pain pain or fever.   Preventive Screening-Counseling &  Management  Alcohol-Tobacco     Smoking Status: never  Current Medications (verified): 1)  Flovent Hfa 110 Mcg/act  Aero (Fluticasone Propionate  Hfa) .... Use One Puffs Every 12 Hours 2)  Combivent 103-18 Mcg/act Aero (Ipratropium-Albuterol) .... 2 Puffs Four Times A Day As Needed 3)  Norvasc 5 Mg  Tabs (Amlodipine Besylate) .... Take 1/2 Tablet By Mouth Once Daily 4)  One-A-Day Womens Formula  Tabs (Multiple Vitamins-Calcium) .... Take 1 By Mouth Once Daily 5)  Citracal/vitamin D 250-200 Mg-Unit Tabs (Calcium Citrate-Vitamin D) .... 2 Tabs By Mouth Once Daily 6)  B Complex   Tabs (B Complex Vitamins) .... Take 1 Tablet By Mouth Once A Day 7)  Synthroid 75 Mcg  Tabs (Levothyroxine Sodium) .... Take 1 Tablet By Mouth Once A Day 8)  Prilosec 20 Mg  Cpdr (Omeprazole) .... Take 1 Tablet By Mouth Once A Day 9)  Vitamin D 2000 Unit Tabs (Cholecalciferol) .... Take 1 By Mouth Once Daily 10)  Trimethoprim 100 Mg Tabs (Trimethoprim) .... 1/2 Tablet Daily 11)  Aerochamber Spacer .... Use With Flovent 12)  Albuterol Sulfate (2.5 Mg/61ml) 0.083% Nebu (Albuterol Sulfate) .Marland Kitchen.. 1 Vial Four Times A Day As Needed 13)  Allegra 180 Mg Tabs (Fexofenadine Hcl) .... 1/2 - 1 Once Daily As Needed 14)  Ativan 0.5 Mg Tabs (Lorazepam) .... 1/2-1 By Mouth Once Daily As Needed Anxiety 15)  Fish Oil 1000 Mg Caps (Omega-3 Fatty Acids) .... Take 2 By Mouth Once Daily 16)  Prednisone 10 Mg Tabs (Prednisone) .... Taper As Directed 17)  Promethazine-Codeine 6.25-10 Mg/52ml Syrp (Promethazine-Codeine) .Marland Kitchen.. 1 Teaspoon Four Times A Day As  Needed Cough 18)  Bione For Eyes .... Use As Needed 19)  Systane 0.4-0.3 % Soln (Polyethyl Glycol-Propyl Glycol) .Marland Kitchen.. 1 Drop in Both Eyes Two Times A Day 20)  Xopenex 1.25 Mg/37ml Nebu (Levalbuterol Hcl) .Marland Kitchen.. 1 Vial in Nebulizer Four Times A Day As Needed  Allergies (verified): 1)  ! Sulfa 2)  ! Dilantin 3)  ! Cipro 4)  ! Codeine 5)  ! * Asacol Mesalamine  Past History:  Past Surgical  History: Last updated: 07/17/2008 Partial colectomy for diverticulitis R hip replacement tonsils hysterectomy  thyroidectomy appendectomy subglottal mucocoel craniotomy meningioma resected 2002  Family History: Last updated: 2009-01-11 Fatrher- died [place] Mother- died old age 50 Twin sister  Social History: Last updated: 05/27/2009 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 STarted on asacol November 08, 2008 HYPERTENSION (ICD-401.9) HYPERLIPIDEMIA (ICD-272.4) CARDIAC MURMUR (ICD-785.2) DIVERTICULITIS, COLON (ICD-562.11) ARTHRITIS (ICD-716.90) ANEMIA, IRON DEFICIENCY, MICROCYTIC (ICD-280.8) THROMBOCYTOSIS (ICD-289.9) LEUKOCYTOSIS (ICD-288.60) ESOPHAGEAL REFLUX (ICD-530.81) PHARYNGITIS, CHRONIC (ICD-472.1) ALLERGIC RHINITIS (ICD-477.9)- Dr Fritch Callas ASTHMATIC BRONCHITIS, ACUTE (ICD-466.0) Hypothyroidism  Review of Systems      See HPI       The patient complains of shortness of breath with activity, productive cough, and non-productive cough.  The patient denies shortness of breath at rest, chest pain, irregular heartbeats, acid heartburn, indigestion, loss of appetite, weight change, abdominal pain, difficulty swallowing, sore throat, tooth/dental problems, headaches, nasal congestion/difficulty breathing through nose, sneezing, itching, ear ache, hand/feet swelling, rash, change in color of mucus, and fever.    Vital Signs:  Patient profile:   77 year old female Height:      63 inches Weight:      125.38 pounds O2 Sat:      96 % on Room air Pulse rate:   111 / minute BP sitting:   96 / 60  (left arm) Cuff size:   regular  Vitals Entered By: Arman Filter LPN (May 01, 2010 4:37 PM)  O2 Flow:  Room air CC: sick visit.  c/o weak, no energy,  no appetite, cough with white colored sputum.  denies n/v/d or fever.  stopped theophylline.  currently on pred taper.  Comments Medications reviewed with patient Arman Filter LPN  May 01, 2010 4:37 PM    Physical Exam  Additional Exam:  General: A/Ox3; pleasant and cooperative, NAD, SKIN: erythema lower bilateral calves. Note some frontal hair thinning NODES: no lymphadenopathy HEENT: [place]/AT, EOM- WNL, Conjuctivae- clear, PERRLA, TM-WNL, Nose- clear, Throat- no visible drainage, s, Mallampati  III, hoarse w/o stridor NECK: Supple w/ fair ROM, JVD- none  , normal carotid impulses w/o bruits Thyroid-old surgical scar,  CHEST:Coarse wheeze and squeeks with active nonproductive, vibratory  cough.  HEART: RRR  with a few skiped beats no m/g/r heard ABDOMEN:soft ZOX:WRUE, nl pulses, no edema , Neg Homan's  NEURO: Grossly intact to observation,       Impression & Recommendations:  Problem # 1:  BRONCHITIS, CHRONIC (ICD-491.9) We stopped her theophylline- just started at discharge.  Cough persists, but not worse pulse is regular wih skip, but not AFib by exam BP is too low  I will take her off Norvasc and send her to the lab while it is still open for check of K, BNP, D dimer and cardiac enzymes.   Problem # 2:  ANEMIA,  IRON DEFICIENCY, MICROCYTIC (ICD-280.8) Known anemia w/o overt blood loss. will check CBC  Medications Added to Medication List This Visit: 1)  Prednisone 10 Mg Tabs (Prednisone) .... Taper as directed 2)  Bione For Eyes  .... Use as needed 3)  Systane 0.4-0.3 % Soln (Polyethyl glycol-propyl glycol) .Marland Kitchen.. 1 drop in both eyes two times a day 4)  Xopenex 1.25 Mg/61ml Nebu (Levalbuterol hcl) .Marland Kitchen.. 1 vial in nebulizer four times a day as needed  Other Orders: Est. Patient Level III (04540) TLB-CBC Platelet - w/Differential (85025-CBCD) TLB-BMP (Basic Metabolic Panel-BMET) (80048-METABOL) TLB-BNP (B-Natriuretic Peptide) (83880-BNPR) TLB-CK-MB (Creatine Kinase MB)  (82553-CKMB) TLB-Cardiac Panel ([id number]_14782-NFAO) T-D-Dimer Fibrin Derivatives Quantitive (13086-57846)  Patient Instructions: 1)  Keep scheduled appointment. We will call you about labs. Until then just follow current directions and take it very easy through the weekend.    Immunization History:  Influenza Immunization History:    Influenza:  historical (02/08/2010)

## 2010-06-12 NOTE — Progress Notes (Signed)
Summary: Flovent RX  Phone Note Call from Patient Call back at Home Phone [phone]   Caller: Patient Call For: young Reason for Call: Refill Medication, Talk to Nurse Summary of Call: Patient requesting refill--flovent--[facility] Initial call taken by: Lehman Prom,  April 30, 2010 9:03 AM  Follow-up for Phone Call        Pt is aware RX sent to pharmacy.Michel Bickers [facility]  April 30, 2010 9:43 AM    Prescriptions: FLOVENT HFA 110 MCG/ACT  AERO (FLUTICASONE PROPIONATE  HFA) Use one puffs every 12 hours  #1 x 6   Entered by:   Michel Bickers CMA   Authorized by:   Waymon Budge MD   Signed by:   Michel Bickers CMA on 04/30/2010   Method used:   Electronically to        [id number] (retail)       [address]       [place], [place]  [address]       Ph: [phone]       Fax: [phone]   RxID:   [id number]

## 2010-06-12 NOTE — Progress Notes (Signed)
Summary: Phone - today feels weak after hosp D/C yesterday.  Phone Note Call from Patient   Summary of Call: Patient discharged from hospital yesterday. Today says breathing not bad- still some cough, bringing up scant mucus. Complaint tonight is that she feels extremely weak- no palpitation, chest pain or nausea. Main changes were that she was switched at discharge from Solumedrol IV to prednisone taper 40 x 2 d, 30 x 2 d starting tomorrow. Also theophylline was added at 150 mg two times a day. I suspect these two meds. She ill stop theophylline now and take prednisone 30 mg tomorrow as planned. If not better overnight, she will call office in AM. I will need to be able to check pulse and BP then.  Initial call taken by: Waymon Budge MD,  April 30, 2010 7:33 PM

## 2010-06-12 NOTE — Letter (Signed)
Summary: Sierra [name]   Imported By: Lester  04/28/2010 08:41:01  _____________________________________________________________________  External Attachment:    Type:   Image     Comment:   External Document

## 2010-06-12 NOTE — Assessment & Plan Note (Signed)
Summary: sick visit/be here at 845am//kcw   Primary Provider/Referring Provider:  Jacky Kindle, Dr Pross[place] Callas Allergy, Dr young Asthma, Dr Claria Dice GI  CC:  Acute visit-deep chest congestion and cough-pinkish green in color for 4 days then clear now..  History of Present Illness:  November 05, 2009- Asthmatic bronchitis, allergic rhinitis/ Dr [place] Callas, Ulcerative colitis Doing well. Admits some raspy voice. Remote hx vocal cord nodule- Dr Haroldine Laws. More recently seeing Dr Lazarus Salines for ears.. Feels heartburn if leaves prilosec off, but never skips it. Had good trip to [place]- had  been concerned but had no problems this trip and has not needed to take more than her routine meds. CXR- stable, NAD no spots, mild CE  February 27, 2010-Asthmatic bronchitis, allergic rhinitis/ Dr [place] Callas, Ulcerative colitis NurseCC: Nurse cc:6 month follow up visit-doing great and no wheezing or SOB. Had a self-limited bronchitis after here last- no problem,. No ongoing asthma concern.  Now on prednisone 20 mg daily for ulcerative colitis- Dr Randa Evens.  Remains on monthly allergy vaccine from Dr [place] Callas. Had flu shot. Saw ENT Dr Cloria Spring for dizziness- no findings. His note reviewed. He didn't find anything related to hoarseness, noting as we had, her use of flovent.   April 10, 2010 Asthmatic bronchitis, allergic rhinitis/ Dr [place] Callas, Ulcerative colitis CC-Acute visit-deep chest congestion and cough-pinkish green in color for 4 days then clear now. Since last here had Z pak clled in Nov 26- finished yesterday. Current illness began with bad sore throat/ head cold before Thanksgiving. Still maxillary pressure, clear nasal discharge, pnd, residual cough and chest rattle bother her most. Denies fever, purulent discharge or GI upset. Up to date on flu and pneumovax.   Preventive Screening-Counseling & Management  Alcohol-Tobacco     Smoking Status: never  Current Medications (verified): 1)  Flovent Hfa 110 Mcg/act  Aero  (Fluticasone Propionate  Hfa) .... Use One Puffs Every 12 Hours 2)  Combivent 103-18 Mcg/act Aero (Ipratropium-Albuterol) .... 2 Puffs Four Times A Day As Needed 3)  Norvasc 5 Mg  Tabs (Amlodipine Besylate) .... Take 1/2 Tablet By Mouth Once Daily 4)  One-A-Day Womens Formula  Tabs (Multiple Vitamins-Calcium) .... Take 1 By Mouth Once Daily 5)  Citracal/vitamin D 250-200 Mg-Unit Tabs (Calcium Citrate-Vitamin D) .... 2 Tabs By Mouth Once Daily 6)  B Complex   Tabs (B Complex Vitamins) .... Take 1 Tablet By Mouth Once A Day 7)  Synthroid 75 Mcg  Tabs (Levothyroxine Sodium) .... Take 1 Tablet By Mouth Once A Day 8)  Prilosec 20 Mg  Cpdr (Omeprazole) .... Take 1 Tablet By Mouth Once A Day 9)  Vitamin D 2000 Unit Tabs (Cholecalciferol) .... Take 1 By Mouth Once Daily 10)  Trimethoprim 100 Mg Tabs (Trimethoprim) .... 1/2 Tablet Daily 11)  Aerochamber Spacer .... Use With Flovent 12)  Albuterol Sulfate (2.5 Mg/61ml) 0.083% Nebu (Albuterol Sulfate) .Marland Kitchen.. 1 Vial Four Times A Day As Needed 13)  Allegra 180 Mg Tabs (Fexofenadine Hcl) .... 1/2 - 1 Once Daily As Needed 14)  Ativan 0.5 Mg Tabs (Lorazepam) .... 1/2-1 By Mouth Once Daily As Needed Anxiety 15)  Fish Oil 1000 Mg Caps (Omega-3 Fatty Acids) .... Take 2 By Mouth Once Daily 16)  Prednisone 5 Mg Tabs (Prednisone) .... Take 1 By Mouth Once Daily 17)  Mmw .... As Needed Oral Thrush  Allergies (verified): 1)  ! Sulfa 2)  ! Dilantin 3)  ! Cipro 4)  ! Codeine 5)  ! * Asacol Mesalamine  Past History:  Past Medical History: Last updated: 05/08/2009 ULCERATIVE COLITIS >dx June 2010 after hematochezia. STarted on asacol November 08, 2008 HYPERTENSION (ICD-401.9) HYPERLIPIDEMIA (ICD-272.4) CARDIAC MURMUR (ICD-785.2) DIVERTICULITIS, COLON (ICD-562.11) ARTHRITIS (ICD-716.90) ANEMIA, IRON DEFICIENCY, MICROCYTIC (ICD-280.8) THROMBOCYTOSIS (ICD-289.9) LEUKOCYTOSIS (ICD-288.60) ESOPHAGEAL REFLUX (ICD-530.81) PHARYNGITIS, CHRONIC (ICD-472.1) ALLERGIC  RHINITIS (ICD-477.9)- Dr [place] Callas ASTHMATIC BRONCHITIS, ACUTE (ICD-466.0)  Past Surgical History: Last updated: 07/17/2008 Partial colectomy for diverticulitis R hip replacement tonsils hysterectomy  thyroidectomy appendectomy subglottal mucocoel craniotomy meningioma resected 2002  Family History: Last updated: Jan 15, 2009 Fatrher- died [place] Mother- died old age 71 Twin sister  Social History: Last updated: 05/27/2009 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  The patient denies shortness of breath at rest, productive cough, coughing up blood, chest pain, irregular heartbeats, acid heartburn, indigestion, loss of appetite, weight change, abdominal pain, difficulty swallowing, sore throat, tooth/dental problems, headaches, sneezing, itching, and ear ache.    Vital Signs:  Patient profile:   77 year old female Height:      63 inches Weight:      129 pounds BMI:     22.93 O2 Sat:      95 % on Room air Pulse rate:   101 / minute BP sitting:   130 / 60  (right arm) Cuff size:   regular  Vitals Entered By: [name] CMA (April 10, 2010 8:50 AM)  O2 Flow:  Room air CC: Acute visit-deep chest congestion and cough-pinkish green in color for 4 days then clear now.   Physical Exam  Additional Exam:  General: A/Ox3; pleasant and cooperative, NAD, SKIN: no rash, lesions NODES: no lymphadenopathy HEENT: [place]/AT, EOM- WNL, Conjuctivae- clear, PERRLA, TM-WNL, Nose- clear, Throat- no visible drainage, Mallampati  III, hoarse w/o stridor NECK: Supple w/ fair ROM, JVD- none, normal carotid impulses w/o bruits Thyroid- ,  CHEST:clear to P&A HEART: RRR, no  m/g/r heard ABDOMEN:soft QMV:HQIO, nl pulses, no edema  NEURO: Grossly intact to observation      Impression & Recommendations:  Problem # 1:  ACUTE BRONCHITIS (ICD-466.0)  Viral infection with wheezey bronchitis, slow to clear  We discussed meds she has at home. We will give neb and depo here and refill cough syrup.  She says she could take this and it helped before, despite remote hx of codeine intolerance.   Her updated medication list for this problem includes:    Flovent Hfa 110 Mcg/act Aero (Fluticasone propionate  hfa) ..... Use one puffs every 12 hours    Combivent 103-18 Mcg/act Aero (Ipratropium-albuterol) .Marland Kitchen... 2 puffs four times a day as needed    Trimethoprim 100 Mg Tabs (Trimethoprim) .Marland Kitchen... 1/2 tablet daily    Albuterol Sulfate (2.5 Mg/72ml) 0.083% Nebu (Albuterol sulfate) .Marland Kitchen... 1 vial four times a day as needed    Promethazine-codeine 6.25-10 Mg/72ml Syrp (Promethazine-codeine) .Marland Kitchen... 1 teaspoon four times a day as needed cough  Medications Added to Medication List This Visit: 1)  Flovent Hfa 110 Mcg/act Aero (Fluticasone propionate  hfa) .... Use one puffs every 12 hours 2)  Fish Oil 1000 Mg Caps (Omega-3 fatty acids) .... Take 2 by mouth once daily 3)  Prednisone 5 Mg Tabs (Prednisone) .... Take 1  by mouth once daily 4)  Promethazine-codeine 6.25-10 Mg/10ml Syrp (Promethazine-codeine) .Marland Kitchen.. 1 teaspoon four times a day as needed cough  Other Orders: Est. Patient Level III ([address])  Patient Instructions: 1)  Keep scheduled appointment or see me sometime in the Spring.  2)  neb Xop 1.25 3)  depo 80   4)  refill cough syrup Prescriptions: PROMETHAZINE-CODEINE 6.25-10 MG/5ML SYRP (PROMETHAZINE-CODEINE) 1 teaspoon four times a day as needed cough  #200 ml x 0   Entered and Authorized by:   Waymon Budge MD   Signed by:   Waymon Budge MD on 04/10/2010   Method used:   Print then Give to Patient   RxID:   [id number]     Appended Document: sick visit/be  here at 845am//kcw    Clinical Lists Changes  Orders: Added new Service order of Admin of Therapeutic Inj  intramuscular or subcutaneous (95621) - Signed Added new Service order of Depo- Medrol 80mg  (J1040) - Signed Added new Service order of Nebulizer Tx (30865) - Signed       Medication Administration  Injection # 1:    Medication: Depo- Medrol 80mg     Diagnosis: ACUTE BRONCHITIS (ICD-466.0)    Route: SQ    Site: RUOQ gluteus    Exp Date: 09/2012    Lot #: [id number]    Mfr: Pharmacia    Patient tolerated injection without complications    Given by: [name] CMA (April 10, 2010 2:34 PM)  Medication # 1:    Medication: Xopenex 1.25mg     Diagnosis: ACUTE BRONCHITIS (ICD-466.0)    Dose: 1 vial    Route: inhaled    Exp Date: 12/2010    Lot #: [id number]    Mfr: Sepracor    Patient tolerated medication without complications    Given by: [name] CMA (April 10, 2010 2:34 PM)  Orders Added: 1)  Admin of Therapeutic Inj  intramuscular or subcutaneous [96372] 2)  Depo- Medrol 80mg  [J1040] 3)  Nebulizer Tx [95284]

## 2010-06-12 NOTE — Assessment & Plan Note (Signed)
Summary: hfu/jd   Primary Provider/Referring Provider:  Jacky Kindle, Dr [place] Callas Allergy, Dr young Asthma, Dr Claria Dice GI  CC:  [facility]-still having low energy levels and high heart rate.Sierra [name]  History of Present Illness: April 17, 2010- Asthmatic bronchitis, allergic rhinitis/ Dr [place] Callas, Ulcerative colitis...Sierra KitchenMarland KitchenMarland Kitchenhusband here Nurse-CC: Not getting better; cough-green, SOB, wheezing,light headed. We gave depo and neb here last week. We called Avelox, then learned allergic Cipro, changed her to Biaxin. Took 3 doses starting yesterday and today has pruritic rash on lower legs. Still harsh cough, productive green no fever. Doesn't feel reflux. Saw Dr Randa Evens today- he called [place] and we worked her in now.  Sputum cx- AFB smear neg. Routine flora- no obvious pathogen target.   May 01, 2010 Asthmatic bronchitis, allergic rhinitis/ Dr [place] Callas, Ulcerative colitis...Sierra KitchenMarland KitchenMarland Kitchenhusband here Nurse-CC: sick visit.  c/o weak, no energy, no appetite, cough with white colored sputum.  denies n/v/d or fever.  stopped theophylline.  currently on pred taper.  Acute visit post hospital. She responded only slowly to iv meds in hosp.. She called last night c/o weak, poor appetite. Says breathing is not all that bad. Still deep cough, but less, not very productive. No pain pain or fever.   May 30, 2010- Asthmatic bronchitis, allergic rhinitis/ Dr [place] Callas, Ulcerative colitis...Sierra KitchenMarland KitchenMarland Kitchenhusband here Nurse-CC: Post Hospital-still having low energy levels and high heart rate. She still gets winded very easily tryng to walk at [employer]. Yesterday a little wheeze and cough. Albuterol and her Flutter helped. DOE causes her to sit with simple ADLs cooking, bathing. Notes a little anterior chest tightnes at times, self limited, not exertipnal. had seen Dr Patty Sermons long ago. No hx VTE.  D dimer was 66 on 05/02/10.  Sputum Cx 04/2010- Positive AFB/ [facility] Discussed and will wait on treating [facility]. CT sinus 21/15/11- normal CXR- 04/24/10-  Stable COPD, NAD Barium swallow- nonspecific dysmotility, no aspiration        Preventive Screening-Counseling & Management  Alcohol-Tobacco     Smoking Status: never  Current Medications (verified): 1)  Flovent Hfa 110 Mcg/act  Aero (Fluticasone Propionate  Hfa) .... Use One Puffs Every 12 Hours 2)  Combivent 103-18 Mcg/act Aero (Ipratropium-Albuterol) .... 2 Puffs Four Times A Day As Needed 3)  Norvasc 5 Mg  Tabs (Amlodipine Besylate) .... Take 1/2 Tablet By Mouth Once Daily 4)  One-A-Day Womens Formula  Tabs (Multiple Vitamins-Calcium) .... Take 1 By Mouth Once Daily 5)  Citracal/vitamin D 250-200 Mg-Unit Tabs (Calcium Citrate-Vitamin D) .... 2 Tabs By Mouth Once Daily 6)  B Complex   Tabs (B Complex Vitamins) .... Take 1 Tablet By Mouth Once A Day 7)  Synthroid 75 Mcg  Tabs (Levothyroxine Sodium) .... Take 1 Tablet By Mouth Once A Day 8)  Prilosec 20 Mg  Cpdr (Omeprazole) .... Take 1 Tablet By Mouth Once A Day 9)  Vitamin D 2000 Unit Tabs (Cholecalciferol) .... Take 1 By Mouth Once Daily 10)  Trimethoprim 100 Mg Tabs (Trimethoprim) .... 1/2 Tablet Daily 11)  Aerochamber Spacer .... Use With Flovent 12)  Albuterol Sulfate (2.5 Mg/65ml) 0.083% Nebu (Albuterol Sulfate) .Sierra [name].. 1 Vial Four Times A Day As Needed 13)  Allegra 180 Mg Tabs (Fexofenadine Hcl) .... 1/2 - 1 Once Daily As Needed 14)  Ativan 0.5 Mg Tabs (Lorazepam) .... 1/2-1 By Mouth Once Daily As Needed Anxiety 15)  Prednisone 5 Mg Tabs (Prednisone) .... Take 1 By Mouth Once Daily 16)  Promethazine-Codeine 6.25-10 Mg/57ml Syrp (Promethazine-Codeine) .Sierra [name].. 1 Teaspoon Four Times A Day  As Needed Cough 17)  Bione For Eyes .... Use As Needed 18)  Systane 0.4-0.3 % Soln (Polyethyl Glycol-Propyl Glycol) .Sierra [name].. 1 Drop in Both Eyes Two Times A Day 19)  Xopenex 1.25 Mg/8ml Nebu (Levalbuterol Hcl) .Sierra [name].. 1 Vial in Nebulizer Four Times A Day As Needed  Allergies (verified): 1)  ! Sulfa 2)  ! Dilantin 3)  ! Cipro 4)  ! Codeine 5)  ! *  Asacol Mesalamine  Past History:  Past Medical History: Last updated: 05/01/2010 ULCERATIVE COLITIS >dx June 2010 after hematochezia. STarted on asacol November 08, 2008 HYPERTENSION (ICD-401.9) HYPERLIPIDEMIA (ICD-272.4) CARDIAC MURMUR (ICD-785.2) DIVERTICULITIS, COLON (ICD-562.11) ARTHRITIS (ICD-716.90) ANEMIA, IRON DEFICIENCY, MICROCYTIC (ICD-280.8) THROMBOCYTOSIS (ICD-289.9) LEUKOCYTOSIS (ICD-288.60) ESOPHAGEAL REFLUX (ICD-530.81) PHARYNGITIS, CHRONIC (ICD-472.1) ALLERGIC RHINITIS (ICD-477.9)- Dr  Callas ASTHMATIC BRONCHITIS, ACUTE (ICD-466.0) Hypothyroidism  Past Surgical History: Last updated: 07/17/2008 Partial colectomy for diverticulitis R hip replacement tonsils hysterectomy  thyroidectomy appendectomy subglottal mucocoel craniotomy meningioma resected 2002  Family History: Last updated: 01/04/2009 Fatrher- died MI Mother- died old age 50 Twin sister  Social History: Last updated: 05/27/2009 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  The patient denies anorexia, fever, weight loss, weight gain, vision loss, decreased hearing, hoarseness, chest pain, syncope, peripheral edema, prolonged cough, headaches, hemoptysis, abdominal pain, melena, severe indigestion/heartburn, unusual weight change, abnormal bleeding, enlarged lymph nodes, and angioedema.    Vital Signs:  Patient profile:   77 year old female Height:      63 inches Weight:      131.13 pounds BMI:     23.31 O2 Sat:      95 % on Room air Pulse rate:   107 / minute BP sitting:   112 / 68  (left arm) Cuff size:   regular  Vitals Entered By: [name] CMA (May 30, 2010 1:59 PM)  O2 Flow:  Room air CC: [facility] Hospital-still having low energy levels and high  heart rate.   Physical Exam  Additional Exam:  General: A/Ox3; pleasant and cooperative, NAD,  Room air sat 95% SKIN: erythema lower bilateral calves. Note some frontal hair thinning NODES: no lymphadenopathy HEENT: [place]/AT, EOM- WNL, Conjuctivae- clear, PERRLA, TM-WNL, Nose- clear, Throat- no visible drainage, , Mallampati  III, hoarse w/o stridor NECK: Supple w/ fair ROM, JVD- 2 cm  , normal carotid impulses w/o bruits Thyroid-old surgical scar,  CHEST:faint crackles, little cough.  HEART: RRR   no m/g/r heard ABDOMEN:soft EAV:WUJW, nl pulses, no edema ,  NEURO: Grossly intact to observation,       Impression & Recommendations:  Problem # 1:  BRONCHITIS, CHRONIC (ICD-491.9) Extended review of lab and test results with her.  Acute exacerbation of her bronchitis put her in hospital, but complaint of exertional dyspnea has persisted. I will get CT to R/O PE,  PFT to compare with 2 years ago, but will also direct her to Dr Patty Sermons for update cardiology eval to see if that might be basis for this dyspnea.  She is not anemic and is not wheezing. PFT 2 years ago was normal without suggesting a pulmonary basis for her complaint.  BP is low for her, just in last few days. (She will redcue Norvasc back to 1/2 daily till she sees Dr  Jacky Kindle).  Medications Added to Medication List This Visit: 1)  Prednisone 5 Mg Tabs (Prednisone) .... Take 1 by mouth once daily  Other Orders: Est. Patient Level IV (16109) Misc. Referral (Misc. Ref) Radiology Referral (Radiology) Cardiology Referral (Cardiology) TLB-BMP (Basic Metabolic Panel-BMET) (80048-METABOL)  Patient Instructions: 1)  cc Dr Jacky Kindle, Dr Patty Sermons 2)  Please schedule a follow-up appointment in 1 month. 3)  See [facility] to schedule PFT, 6 MWT and appointment with Dr Patty Sermons 4)  lab

## 2010-06-12 NOTE — Miscellaneous (Signed)
Summary: Orders Update pft charges   Clinical Lists Changes  Orders: Added new Service order of Carbon Monoxide diffusing w/capacity (94729) - Signed Added new Service order of Lung Volumes/Gas dilution or washout (94727) - Signed Added new Service order of Spirometry (Pre & Post) (94060) - Signed 

## 2010-06-12 NOTE — Assessment & Plan Note (Signed)
Summary: 6 min walk  Nurse Visit   Vital Signs:  Patient profile:   77 year old female Pulse rate:   93 / minute BP sitting:   120 / 64  Allergies: 1)  ! Sulfa 2)  ! Dilantin 3)  ! Cipro 4)  ! Codeine 5)  ! * Asacol Mesalamine  Orders Added: 1)  Pulmonary Stress (6 min walk) [94620]   Six Minute Walk Test Medications taken before test(dose and time): synthroid, omeprazole @ 0615.  norvasc, prednisone, trimethoprim, mvi, b complex @ 0815. Supplemental oxygen during the test: No  Lap counter(place a tick mark inside a square for each lap completed) lap 1 complete  lap 2 complete   lap 3 complete   lap 4 complete  lap 5 complete  lap 6 complete  lap 7 complete   lap 8 complete   lap 9 complete  lap 10 complete   Baseline  BP sitting: 120/ 64 Heart rate: 93 Dyspnea ( Borg scale) 1 Fatigue (Borg scale) 0 SPO2 97 ra  End Of Test  BP sitting: 164/ 56 Heart rate: 132 Dyspnea ( Borg scale) 3 Fatigue (Borg scale) 2 SPO2 96 ra  2 Minutes post  BP sitting: 136/ 64 Heart rate: 109 SPO2 97 ra  Stopped or paused before six minutes? No Other symptoms at end of exercise: Dizziness  Interpretation: Number of laps  10 X 48 meters =   480 meters+ final partial lap: 28 meters =    508 meters   Total distance walked in six minutes: 508 meters  Tech ID: Boone Master CNA/MA (June 03, 2010 10:13 AM) Jeremy Johann Comments pt completed 6 min walk test with no rest breaks.  pt did c/o some dizziness w/ walking but stated this was d/t a benign brain tumor and is normal for her w/ activity.  no other complaints at the completion of the test.

## 2010-06-12 NOTE — Progress Notes (Signed)
Summary: pt here today wants ct results  Phone Note Call from Patient Call back at Home Phone [phone]   Caller: Patient Call For: YOUNG Reason for Call: Lab or Test Results Summary of Call: patient is here for a PFT and SMW. She has a CT done yesterday and would like the results before she leaves.  Initial call taken by: Valinda Hoar,  June 03, 2010 8:56 AM  Follow-up for Phone Call        Per CT append by Boone Master, she informd pt of CT results while pt was in office for SMW per append by CY.   Follow-up by: [name] RN,  June 03, 2010 5:31 PM

## 2010-06-12 NOTE — Progress Notes (Signed)
Summary: cough sick  Phone Note Call from Patient Call back at [phone]   Caller: spouse dr Salguero Call For: young Summary of Call: pt has finished antibodic still not any better needs some help [place] on [place] Initial call taken by: Lacinda Axon,  April 24, 2010 8:14 AM  Follow-up for Phone Call        Spouse c/o pt no better, took last abx this AM, productive cough with yellow mucus, increased SOB all the time, unable to sleep at night having to wake up to use neb treatment, and weakness. Denies fever. No relief from Tussionex, advised Christiane Ha with honey worked better for cough. Please advise. Thanks. Zackery Barefoot CMA  April 24, 2010 9:25 AM  Allergies (verified):  1)  ! Sulfa 2)  ! Dilantin 3)  ! Cipro 4)  ! Codeine 5)  ! * Asacol Mesalamine  Additional Follow-up for Phone Call Additional follow up Details #1::        Per CDY-give patient the option to be admitted to hospital; I spoke with pts spouse who gave info to patient and she agreed to be admitted ([facility]); I spoke with Brett Canales Minor who will admit for CDY. Brett Canales is aware that pt will be going to [facility] and be admited from there without orders from CDY.[name] CMA  April 24, 2010 10:42 AM

## 2010-06-27 ENCOUNTER — Encounter: Payer: Self-pay | Admitting: Internal Medicine

## 2010-06-27 ENCOUNTER — Ambulatory Visit (INDEPENDENT_AMBULATORY_CARE_PROVIDER_SITE_OTHER): Payer: PRIVATE HEALTH INSURANCE | Admitting: Internal Medicine

## 2010-06-27 DIAGNOSIS — J42 Unspecified chronic bronchitis: Secondary | ICD-10-CM

## 2010-06-27 DIAGNOSIS — A319 Mycobacterial infection, unspecified: Secondary | ICD-10-CM

## 2010-06-27 DIAGNOSIS — J309 Allergic rhinitis, unspecified: Secondary | ICD-10-CM

## 2010-07-01 ENCOUNTER — Telehealth (INDEPENDENT_AMBULATORY_CARE_PROVIDER_SITE_OTHER): Payer: Self-pay | Admitting: *Deleted

## 2010-07-02 ENCOUNTER — Ambulatory Visit (INDEPENDENT_AMBULATORY_CARE_PROVIDER_SITE_OTHER): Payer: Medicare Other

## 2010-07-02 ENCOUNTER — Ambulatory Visit (HOSPITAL_COMMUNITY): Payer: Medicare Other | Attending: Cardiology

## 2010-07-02 ENCOUNTER — Other Ambulatory Visit (HOSPITAL_COMMUNITY): Payer: PRIVATE HEALTH INSURANCE

## 2010-07-02 ENCOUNTER — Encounter: Payer: Self-pay | Admitting: Cardiovascular Disease

## 2010-07-02 DIAGNOSIS — R011 Cardiac murmur, unspecified: Secondary | ICD-10-CM

## 2010-07-02 DIAGNOSIS — I10 Essential (primary) hypertension: Secondary | ICD-10-CM | POA: Insufficient documentation

## 2010-07-02 DIAGNOSIS — R0989 Other specified symptoms and signs involving the circulatory and respiratory systems: Secondary | ICD-10-CM | POA: Insufficient documentation

## 2010-07-02 DIAGNOSIS — E785 Hyperlipidemia, unspecified: Secondary | ICD-10-CM | POA: Insufficient documentation

## 2010-07-02 DIAGNOSIS — R0609 Other forms of dyspnea: Secondary | ICD-10-CM | POA: Insufficient documentation

## 2010-07-02 DIAGNOSIS — M25559 Pain in unspecified hip: Secondary | ICD-10-CM | POA: Insufficient documentation

## 2010-07-02 DIAGNOSIS — J449 Chronic obstructive pulmonary disease, unspecified: Secondary | ICD-10-CM | POA: Insufficient documentation

## 2010-07-02 DIAGNOSIS — Z8249 Family history of ischemic heart disease and other diseases of the circulatory system: Secondary | ICD-10-CM | POA: Insufficient documentation

## 2010-07-02 DIAGNOSIS — J4489 Other specified chronic obstructive pulmonary disease: Secondary | ICD-10-CM | POA: Insufficient documentation

## 2010-07-02 DIAGNOSIS — R0602 Shortness of breath: Secondary | ICD-10-CM

## 2010-07-07 ENCOUNTER — Ambulatory Visit (INDEPENDENT_AMBULATORY_CARE_PROVIDER_SITE_OTHER): Payer: Medicare Other | Admitting: Cardiology

## 2010-07-07 DIAGNOSIS — I4949 Other premature depolarization: Secondary | ICD-10-CM

## 2010-07-07 DIAGNOSIS — E039 Hypothyroidism, unspecified: Secondary | ICD-10-CM

## 2010-07-07 DIAGNOSIS — I119 Hypertensive heart disease without heart failure: Secondary | ICD-10-CM

## 2010-07-08 NOTE — Progress Notes (Signed)
Summary: stress echo  Phone Note Outgoing Call   Call placed by: Stanton Kidney, EMT-P,  July 01, 2010 3:46 PM Action Taken: Phone Call Completed Summary of Call: S/W patient ref: stress echo appt. Stanton Kidney, EMT-P  July 01, 2010 3:47 PM

## 2010-07-08 NOTE — Assessment & Plan Note (Signed)
Summary: rov 1 month/kp   Primary Provider/Referring Provider:  Jacky Kindle, Dr [place] Callas Allergy, Dr young Asthma, Dr Claria Dice GI  CC:  1 month follow up visit-review PFT and test results. Slight SOB but no wheezing.Marland Kitchen  History of Present Illness: May 30, 2010- Asthmatic bronchitis, allergic rhinitis/ Dr [place] Callas, Ulcerative colitis...Marland KitchenMarland KitchenMarland Kitchenhusband here Nurse-CC: [facility] Hospital-still having low energy levels and high heart rate. She still gets winded very easily tryng to walk at [employer]. Yesterday a little wheeze and cough. Albuterol and her Flutter helped. DOE causes her to sit with simple ADLs cooking, bathing. Notes a little anterior chest tightnes at times, self limited, not exertipnal. had seen Dr Patty Sermons long ago. No hx VTE.  D dimer was 66 on 05/02/10.  Sputum Cx 04/2010- Positive AFB/ [facility] Discussed and will wait on treating [facility]. CT sinus 21/15/11- normal CXR- 04/24/10- Stable COPD, NAD Barium swallow- nonspecific dysmotility, no aspiration  June 27, 2010-  Asthmatic bronchitis, allergic rhinitis/ Dr [place] Callas, Ulcerative colitis Nurse-CC: 1 month follow up visit-review PFT and test results. Slight SOB but no wheezing. Feels well now, after a series of overlapping bronchitis/ pneumonia episodes over the Fall/ Winter.. Got chilled visiting [place] OOT and some cough. Didn't get an infection. Feels she dodged that one.  Has pending cardiology tests w/ Dr Patty Sermons.  PFT- 06/03/10- WNL with some response to bronchodilator in small airways. FEV1/FVC 0.79. -97%->96%->97%, room air. 508 meters. CT-resolving RLL pneumonia- report imbedded  BMP (METABOL) 05/30/10   Sodium                    139 mEq/L                    Potassium                 4.1 mEq/L                    Chl                           102   CO2                             27  Glu                            151(just off pred taper)  BUN                             17  Cr                                0.7  Ca                               9.7     Preventive Screening-Counseling & Management  Alcohol-Tobacco     Smoking Status: never  Current Medications (verified): 1)  Flovent Hfa 110 Mcg/act  Aero (Fluticasone Propionate  Hfa) .... Use One Puffs Every 12 Hours 2)  Combivent 103-18 Mcg/act Aero (Ipratropium-Albuterol) .... 2 Puffs Four Times A Day As Needed 3)  Norvasc 5 Mg  Tabs (Amlodipine Besylate) .... Take 1/2 Tablet By Mouth Once Daily 4)  One-A-Day Womens Formula  Tabs (Multiple Vitamins-Calcium) .... Take 1 By Mouth Once Daily 5)  Citracal/vitamin D 250-200 Mg-Unit Tabs (Calcium Citrate-Vitamin D) .... 2 Tabs By Mouth Once Daily 6)  B Complex   Tabs (B Complex Vitamins) .... Take 1 Tablet By Mouth Once A Day 7)  Synthroid 75 Mcg  Tabs (Levothyroxine Sodium) .... Take 1 Tablet By Mouth Once A Day 8)  Prilosec 20 Mg  Cpdr (Omeprazole) .... Take 1 Tablet By Mouth Once A Day 9)  Vitamin D 2000 Unit Tabs (Cholecalciferol) .... Take 1 By Mouth Once Daily 10)  Trimethoprim 100 Mg Tabs (Trimethoprim) .... 1/2 Tablet Daily 11)  Aerochamber Spacer .... Use With Flovent 12)  Albuterol Sulfate (2.5 Mg/70ml) 0.083% Nebu (Albuterol Sulfate) .Marland Kitchen.. 1 Vial Four Times A Day As Needed 13)  Allegra 180 Mg Tabs (Fexofenadine Hcl) .... 1/2 - 1 Once Daily As Needed 14)  Ativan 0.5 Mg Tabs (Lorazepam) .... 1/2-1 By Mouth Once Daily As Needed Anxiety 15)  Prednisone 5 Mg Tabs (Prednisone) .... Take 1 By Mouth Once Daily 16)  Bione For Eyes .... Use As Needed 17)  Systane 0.4-0.3 % Soln (Polyethyl Glycol-Propyl Glycol) .Marland Kitchen.. 1 Drop in Both Eyes Two Times A Day 18)  Xopenex 1.25 Mg/62ml Nebu (Levalbuterol Hcl) .Marland Kitchen.. 1 Vial in Nebulizer Four Times A Day As Needed  Allergies (verified): 1)  ! Sulfa 2)  ! Dilantin 3)  ! Cipro 4)  ! Codeine 5)  ! * Asacol Mesalamine  Past History:  Past Medical History: ULCERATIVE COLITIS >dx June 2010 after hematochezia. STarted on asacol November 08, 2008  Maint  prednisone HYPERTENSION (ICD-401.9) HYPERLIPIDEMIA (ICD-272.4) CARDIAC MURMUR (ICD-785.2) DIVERTICULITIS, COLON (ICD-562.11) ARTHRITIS (ICD-716.90) ANEMIA, IRON DEFICIENCY, MICROCYTIC (ICD-280.8) THROMBOCYTOSIS (ICD-289.9) LEUKOCYTOSIS (ICD-288.60) ESOPHAGEAL REFLUX (ICD-530.81) PHARYNGITIS, CHRONIC (ICD-472.1) ALLERGIC RHINITIS (ICD-477.9)- Dr [place] Callas ASTHMATIC BRONCHITIS, ACUTE (ICD-466.0) Hypothyroidism  Review of Systems      See HPI  The patient denies shortness of breath with activity, shortness of breath at rest, productive cough, non-productive cough, coughing up blood, chest pain, irregular heartbeats, acid heartburn, indigestion, loss of appetite, weight change, abdominal pain, difficulty swallowing, sore throat, tooth/dental problems, headaches, nasal congestion/difficulty breathing through nose, sneezing, itching, ear ache, anxiety, hand/feet swelling, rash, change in color of mucus, and fever.    Vital Signs:  Patient profile:   77 year old female Height:      63 inches Weight:      133 pounds BMI:     23.65 O2 Sat:      98 % on Room air Pulse rate:   87 / minute BP sitting:   116 / 70  (left arm) Cuff size:   regular  Vitals Entered By: [name] CMA (June 27, 2010 11:49 AM)  O2 Flow:  Room air CC: 1 month follow up visit-review PFT and test results. Slight SOB but no wheezing.   Physical Exam  Additional Exam:  General: A/Ox3; pleasant and cooperative, NAD,  Room air sat 95% SKIN: erythema lower bilateral calves. Note some frontal hair thinning NODES: no lymphadenopathy HEENT: [place]/AT, EOM- WNL, Conjuctivae- clear, PERRLA, TM-WNL, Nose- clear, Throat- no visible drainage, , Mallampati  III, hoarse w/o stridor NECK: Supple w/ fair ROM, JVD- 2 cm  , normal carotid impulses w/o bruits Thyroid-old surgical scar,  CHEST: clear today- no crackles or cough.  HEART: RRR   no m/g/r heard ABDOMEN:soft EAV:WUJW, nl pulses, no edema ,  NEURO: Grossly intact  to observation,  CT of Chest  Procedure date:  06/03/2010  Findings:      CT ANGIO CHEST W/CM &/OR WO/CM - [id number]  Clinical Data:  Short of breath   CT ANGIOGRAPHY CHEST WITH CONTRAST  Technique:  Multidetector CT imaging of the chest was performed using the standard protocol during bolus administration of intravenous contrast.  Multiplanar CT image reconstructions including MIPs were obtained to evaluate the vascular anatomy.  Contrast:  80 ml Omnipaque-300  Comparison:  05/14/2009   Findings:  There are no filling defects in the pulmonary arterial tree to suggest acute pulmonary thromboembolism.  Stable appearance of the ascending aorta with maximal AP diameter of 03/01 7 cm.   Right lower lobe patchy pulmonary opacity has improved.  No pneumothorax or pleural effusion.  Minimal dependent atelectasis at the lung bases.  Review of the MIP images confirms the above findings.   IMPRESSION: No evidence of acute pulmonary thromboembolism.  Improved patchy density at the right lung base.   Read By:  Jolaine Click,  M.D.     Released By:  Jolaine Click,  M.D. Signed by Waymon Budge MD on 06/02/2010 at 5:39 PM   Impression & Recommendations:  Problem # 1:  BRONCHITIS, CHRONIC (ICD-491.9)  She is finally recovering from a very sustained bronchits/ pneumonia RLL. I think this has included serial overlapping events, mostly viral triggerd acute bronchtis. Her maintenance prednisone may predispose some to infection. Watch for aspiration  She asked about preventing more of these. We discussed Daliresp. She has UC and potential for GI side effects from Daliresp is high.   Problem # 2:  ATYPICAL MYCOBACTERIAL INFECTION (ICD-031.9) Hx. This is not clinically evident but as we watch over time without confounding acute issues, we may see it again.   Problem # 3:  ALLERGIC RHINITIS (ICD-477.9) Dr [place] Callas manages this for her.  Sneezing more. Allegra isn't helping.  We  mentioned available alternatives and how to watch for sinusitis.  Her updated medication list for this problem includes:    Allegra 180 Mg Tabs (Fexofenadine hcl) .Marland Kitchen... 1/2 - 1 once daily as needed  Problem # 4:  PNEUMONIA (ICD-486) Community pneumonia on prednisone. Resolving on CT late January, now clinically resolved. Watch for recurence.  Her updated medication list for this problem includes:    Trimethoprim 100 Mg Tabs (Trimethoprim) .Marland Kitchen... 1/2 tablet daily  Other Orders: Est. Patient Level IV (28413)  Patient Instructions: 1)  Please schedule a follow-up appointment in 4 months. 2)  I suggest we wait for now on trying Daliresp, because it can have GI side effects complicating your UC. If needed in the future, we can consider it in hopes of reducing bronchitis flareups.  3)  If Allegra isn't working well enough as an antihistamine, you could try otc: 4)  Claritin/ loratadine 5)  Zyrtec/ cetirizine     CT of Chest  Procedure date:  06/03/2010  Findings:      CT ANGIO CHEST W/CM &/OR WO/CM - [id number]  Clinical Data:  Short of breath   CT ANGIOGRAPHY CHEST WITH CONTRAST  Technique:  Multidetector CT imaging of the chest was performed using the standard protocol during bolus administration of intravenous contrast.  Multiplanar CT image reconstructions including MIPs were obtained to evaluate the vascular anatomy.  Contrast:  80 ml Omnipaque-300  Comparison:  05/14/2009   Findings:  There are no filling defects in the pulmonary arterial tree to suggest acute pulmonary thromboembolism.  Stable appearance of the ascending aorta with maximal  AP diameter of 03/01 7 cm.   Right lower lobe patchy pulmonary opacity has improved.  No pneumothorax or pleural effusion.  Minimal dependent atelectasis at the lung bases.  Review of the MIP images confirms the above findings.   IMPRESSION: No evidence of acute pulmonary thromboembolism.  Improved patchy density at the right lung  base.   Read By:  Jolaine Click,  M.D.     Released By:  Jolaine Click,  M.D. Signed by Waymon Budge MD on 06/02/2010 at 5:39 PM

## 2010-07-17 ENCOUNTER — Encounter: Payer: Self-pay | Admitting: Internal Medicine

## 2010-07-21 LAB — LACTIC ACID, PLASMA
Lactic Acid, Venous: 3.4 mmol/L — ABNORMAL HIGH (ref 0.5–2.2)
Lactic Acid, Venous: 3.9 mmol/L — ABNORMAL HIGH (ref 0.5–2.2)

## 2010-07-21 LAB — URINALYSIS, ROUTINE W REFLEX MICROSCOPIC
Glucose, UA: NEGATIVE mg/dL
Ketones, ur: NEGATIVE mg/dL
Leukocytes, UA: NEGATIVE
Nitrite: NEGATIVE
Protein, ur: NEGATIVE mg/dL
pH: 6.5 (ref 5.0–8.0)

## 2010-07-21 LAB — STREP PNEUMONIAE URINARY ANTIGEN: Strep Pneumo Urinary Antigen: NEGATIVE

## 2010-07-21 LAB — CBC
HCT: 40.7 % (ref 36.0–46.0)
Hemoglobin: 13.3 g/dL (ref 12.0–15.0)
MCH: 28.9 pg (ref 26.0–34.0)
MCHC: 32.7 g/dL (ref 30.0–36.0)
MCV: 88.5 fL (ref 78.0–100.0)
RDW: 16.6 % — ABNORMAL HIGH (ref 11.5–15.5)

## 2010-07-21 LAB — GLUCOSE, CAPILLARY
Glucose-Capillary: 142 mg/dL — ABNORMAL HIGH (ref 70–99)
Glucose-Capillary: 151 mg/dL — ABNORMAL HIGH (ref 70–99)
Glucose-Capillary: 157 mg/dL — ABNORMAL HIGH (ref 70–99)
Glucose-Capillary: 174 mg/dL — ABNORMAL HIGH (ref 70–99)
Glucose-Capillary: 183 mg/dL — ABNORMAL HIGH (ref 70–99)
Glucose-Capillary: 193 mg/dL — ABNORMAL HIGH (ref 70–99)
Glucose-Capillary: 195 mg/dL — ABNORMAL HIGH (ref 70–99)
Glucose-Capillary: 228 mg/dL — ABNORMAL HIGH (ref 70–99)

## 2010-07-21 LAB — PROCALCITONIN: Procalcitonin: <0.1 ng/mL

## 2010-07-21 LAB — COMPREHENSIVE METABOLIC PANEL
ALT: 23 U/L (ref 0–35)
AST: 28 U/L (ref 0–37)
Albumin: 3.6 g/dL (ref 3.5–5.2)
CO2: 24 mEq/L (ref 19–32)
Calcium: 9.8 mg/dL (ref 8.4–10.5)
GFR calc Af Amer: >60 mL/min (ref >60)
Sodium: 136 mEq/L (ref 135–145)
Total Protein: 7 g/dL (ref 6.0–8.3)

## 2010-07-21 LAB — BASIC METABOLIC PANEL
BUN: 13 mg/dL (ref 6–23)
CO2: 23 mEq/L (ref 19–32)
Chloride: 100 mEq/L (ref 96–112)
GFR calc non Af Amer: 58 mL/min — ABNORMAL LOW (ref >60)
Glucose, Bld: 137 mg/dL — ABNORMAL HIGH (ref 70–99)
Potassium: 4.3 mEq/L (ref 3.5–5.1)
Sodium: 136 mEq/L (ref 135–145)

## 2010-07-21 LAB — CULTURE, BLOOD (ROUTINE X 2)
Culture  Setup Time: 201112151612
Culture  Setup Time: 201112151612
Culture: NO GROWTH

## 2010-07-21 LAB — EXPECTORATED SPUTUM ASSESSMENT W GRAM STAIN, RFLX TO RESP C

## 2010-07-21 LAB — DIFFERENTIAL
Basophils Absolute: 0.1 10*3/uL (ref 0.0–0.1)
Basophils Relative: 0 % (ref 0–1)
Eosinophils Absolute: 0.8 10*3/uL — ABNORMAL HIGH (ref 0.0–0.7)
Eosinophils Relative: 5 % (ref 0–5)
Monocytes Absolute: 1.3 10*3/uL — ABNORMAL HIGH (ref 0.1–1.0)
Monocytes Relative: 9 % (ref 3–12)

## 2010-07-21 LAB — URINE CULTURE: Culture: NO GROWTH

## 2010-07-21 LAB — CK TOTAL AND CKMB (NOT AT ARMC)
CK, MB: 1.5 ng/mL (ref 0.3–4.0)
Relative Index: INVALID (ref 0.0–2.5)
Total CK: 66 U/L (ref 7–177)

## 2010-07-21 LAB — TSH
TSH: 0.311 u[IU]/mL — ABNORMAL LOW (ref 0.350–4.500)
TSH: 0.706 u[IU]/mL (ref 0.350–4.500)

## 2010-07-21 LAB — PHOSPHORUS: Phosphorus: 2.5 mg/dL (ref 2.3–4.6)

## 2010-07-21 LAB — BRAIN NATRIURETIC PEPTIDE: Pro B Natriuretic peptide (BNP): <30 pg/mL (ref 0.0–100.0)

## 2010-07-21 LAB — TROPONIN I: Troponin I: 0.02 ng/mL (ref 0.00–0.06)

## 2010-07-21 LAB — CULTURE, RESPIRATORY W GRAM STAIN

## 2010-07-27 LAB — URINALYSIS, ROUTINE W REFLEX MICROSCOPIC
Leukocytes, UA: NEGATIVE
Nitrite: NEGATIVE
Specific Gravity, Urine: 1.025 (ref 1.005–1.030)
Urobilinogen, UA: 0.2 mg/dL (ref 0.0–1.0)
pH: 6.5 (ref 5.0–8.0)

## 2010-07-27 LAB — GLUCOSE, CAPILLARY
Glucose-Capillary: 115 mg/dL — ABNORMAL HIGH (ref 70–99)
Glucose-Capillary: 118 mg/dL — ABNORMAL HIGH (ref 70–99)
Glucose-Capillary: 135 mg/dL — ABNORMAL HIGH (ref 70–99)
Glucose-Capillary: 136 mg/dL — ABNORMAL HIGH (ref 70–99)
Glucose-Capillary: 138 mg/dL — ABNORMAL HIGH (ref 70–99)
Glucose-Capillary: 143 mg/dL — ABNORMAL HIGH (ref 70–99)
Glucose-Capillary: 147 mg/dL — ABNORMAL HIGH (ref 70–99)
Glucose-Capillary: 161 mg/dL — ABNORMAL HIGH (ref 70–99)
Glucose-Capillary: 163 mg/dL — ABNORMAL HIGH (ref 70–99)
Glucose-Capillary: 167 mg/dL — ABNORMAL HIGH (ref 70–99)
Glucose-Capillary: 178 mg/dL — ABNORMAL HIGH (ref 70–99)
Glucose-Capillary: 184 mg/dL — ABNORMAL HIGH (ref 70–99)
Glucose-Capillary: 184 mg/dL — ABNORMAL HIGH (ref 70–99)
Glucose-Capillary: 186 mg/dL — ABNORMAL HIGH (ref 70–99)
Glucose-Capillary: 215 mg/dL — ABNORMAL HIGH (ref 70–99)
Glucose-Capillary: 233 mg/dL — ABNORMAL HIGH (ref 70–99)
Glucose-Capillary: 272 mg/dL — ABNORMAL HIGH (ref 70–99)

## 2010-07-27 LAB — POCT CARDIAC MARKERS
CKMB, poc: 1.3 ng/mL (ref 1.0–8.0)
Myoglobin, poc: 109 ng/mL (ref 12–200)
Troponin i, poc: <0.05 ng/mL (ref 0.00–0.09)

## 2010-07-27 LAB — BASIC METABOLIC PANEL
GFR calc non Af Amer: 50 mL/min — ABNORMAL LOW (ref >60)
Glucose, Bld: 189 mg/dL — ABNORMAL HIGH (ref 70–99)
Potassium: 4.7 mEq/L (ref 3.5–5.1)
Sodium: 135 mEq/L (ref 135–145)

## 2010-07-27 LAB — CBC
HCT: 39 % (ref 36.0–46.0)
HCT: 39.1 % (ref 36.0–46.0)
Hemoglobin: 12.8 g/dL (ref 12.0–15.0)
MCHC: 32.7 g/dL (ref 30.0–36.0)
MCV: 80.4 fL (ref 78.0–100.0)
MCV: 81.6 fL (ref 78.0–100.0)
Platelets: 376 10*3/uL (ref 150–400)
Platelets: 381 10*3/uL (ref 150–400)
RDW: 16.3 % — ABNORMAL HIGH (ref 11.5–15.5)
RDW: 17.2 % — ABNORMAL HIGH (ref 11.5–15.5)

## 2010-07-27 LAB — POCT I-STAT, CHEM 8
BUN: 28 mg/dL — ABNORMAL HIGH (ref 6–23)
Calcium, Ion: 1.05 mmol/L — ABNORMAL LOW (ref 1.12–1.32)
Chloride: 107 mEq/L (ref 96–112)
Creatinine, Ser: 1.2 mg/dL (ref 0.4–1.2)
Glucose, Bld: 202 mg/dL — ABNORMAL HIGH (ref 70–99)
HCT: 42 % (ref 36.0–46.0)
Hemoglobin: 14.3 g/dL (ref 12.0–15.0)
Potassium: 3.9 mEq/L (ref 3.5–5.1)
Sodium: 135 meq/L (ref 135–145)
TCO2: 22 mmol/L (ref 0–100)

## 2010-07-27 LAB — DIFFERENTIAL
Basophils Absolute: 0 10*3/uL (ref 0.0–0.1)
Eosinophils Absolute: 0 10*3/uL (ref 0.0–0.7)
Eosinophils Relative: 0 % (ref 0–5)
Lymphocytes Relative: 5 % — ABNORMAL LOW (ref 12–46)
Monocytes Absolute: 0.7 10*3/uL (ref 0.1–1.0)

## 2010-07-27 LAB — URINE MICROSCOPIC-ADD ON

## 2010-07-27 LAB — LEGIONELLA ANTIGEN, URINE: Legionella Antigen, Urine: NEGATIVE

## 2010-07-27 LAB — URINE CULTURE: Culture: NO GROWTH

## 2010-07-27 LAB — BLOOD GAS, ARTERIAL
Drawn by: 246861
O2 Content: 2 L/min
TCO2: 20.8 mmol/L (ref 0–100)
pCO2 arterial: 36.9 mmHg (ref 35.0–45.0)
pH, Arterial: 7.41 — ABNORMAL HIGH (ref 7.350–7.400)
pO2, Arterial: 108 mmHg — ABNORMAL HIGH (ref 80.0–100.0)

## 2010-07-27 LAB — URINALYSIS, MICROSCOPIC ONLY
Bilirubin Urine: NEGATIVE
Ketones, ur: NEGATIVE mg/dL
Nitrite: NEGATIVE
Protein, ur: NEGATIVE mg/dL
Urobilinogen, UA: 0.2 mg/dL (ref 0.0–1.0)
pH: 5.5 (ref 5.0–8.0)

## 2010-07-27 LAB — COMPREHENSIVE METABOLIC PANEL
Albumin: 3.6 g/dL (ref 3.5–5.2)
Alkaline Phosphatase: 42 U/L (ref 39–117)
BUN: 26 mg/dL — ABNORMAL HIGH (ref 6–23)
Calcium: 9.3 mg/dL (ref 8.4–10.5)
Glucose, Bld: 204 mg/dL — ABNORMAL HIGH (ref 70–99)
Potassium: 3.6 mEq/L (ref 3.5–5.1)
Total Protein: 7.3 g/dL (ref 6.0–8.3)

## 2010-07-27 LAB — CULTURE, BLOOD (ROUTINE X 2)

## 2010-07-27 LAB — PROTIME-INR
INR: 1.11 (ref 0.00–1.49)
Prothrombin Time: 14.2 seconds (ref 11.6–15.2)

## 2010-07-27 LAB — STREP PNEUMONIAE URINARY ANTIGEN: Strep Pneumo Urinary Antigen: NEGATIVE

## 2010-07-29 NOTE — Letter (Signed)
Summary: [facility]  [facility]   Imported By: Sherian Rein 07/22/2010 08:49:52  _____________________________________________________________________  External Attachment:    Type:   Image     Comment:   External Document

## 2010-07-29 NOTE — Letter (Signed)
Summary: [facility]  [facility]   Imported By: Sherian Rein 07/22/2010 08:38:04  _____________________________________________________________________  External Attachment:    Type:   Image     Comment:   External Document

## 2010-08-02 ENCOUNTER — Telehealth: Payer: Self-pay | Admitting: Adult Health

## 2010-08-02 NOTE — Telephone Encounter (Signed)
Pt of Dr. Maple Hudson  With known hx of asthma and AR, complains of cough, congestion with green mucus, wheezing and dsypnea x 1 week, No fevers  otc not helping.  She has multiple abx allergies. Says she can tolerate zpack.   rx called to [facility] zpack #1 no refills PRed taper over week (4 tabs for 2 days, then 3 tabs for 2 days, 2 tabs for 2 days, then 1 tab for 2 days, then back to 5mg  daily ) Pt to make ov first of next week  Please contact office for sooner follow up if symptoms do not improve or worsen or seek emergency care  Will forward for FYI to Dr. Maple Hudson

## 2010-08-13 ENCOUNTER — Ambulatory Visit: Payer: Self-pay | Admitting: Internal Medicine

## 2010-09-23 NOTE — Discharge Summary (Signed)
Sierra [name], Sierra [name] NO.:  [id number]   MEDICAL RECORD NO.:  [id number]          PATIENT TYPE:  INP   LOCATION:  1340                         FACILITY:  [facility]   PHYSICIAN:  Ardeth Sportsman, MD     DATE OF BIRTH:  12/30/1928   DATE OF ADMISSION:  02/15/2007  DATE OF DISCHARGE:  02/23/2007                               DISCHARGE SUMMARY   PRIMARY CARE PHYSICIAN:  Pam Drown, M.D.   GASTROENTEROLOGIST:  Llana Aliment. Randa Evens, M.D.   OTHER PHYSICIAN:  Cassell Clement, M.D.   SURGEON:  Sandria Bales. Ezzard Standing, M.D.   PRINCIPAL DIAGNOSIS:  Recurrent diverticulitis with inflammation.   PROCEDURE:  Lap, converted to open sigmoid colectomy with anastomosis on  February 15, 2007.   SECONDARY DIAGNOSES:  1. Asthma.  2. Hypertension.  3. History of benign brain tumor, status post resection.  4. Adrenal suppression, on chronic steroids x6 years.  5. Intermittent dizziness.  6. Polycythemia vera.  7. History of hip replacement.  8. History of Clostridium difficile in the past.  9. Stable splenic lesion by CT and MRI.   HISTORY OF PRESENT ILLNESS:  Sierra [name] is a pleasant 77 year old female  who has had recurrent episodes of diverticulitis.  She underwent a  surgical consultation with Dr. Ovidio Kin, who felt she would benefit  from a resection, since she has had recurrent episodes.   HOSPITAL COURSE:  She underwent the above on February 15, 2007.  Postoperatively she had a little bit of issue with urinary retention  that improved by the time of discharge and she was urinating on her own.  She did have increased blood sugars, probably secondary to the increased  steroid boluses, given the stress of surgery.  Her Glucometers were  falling and never went over 200.  Around the time of discharge they were  110's to 170's.  She had somewhat of an ileus, but gradually she was  able to advance her diet initially on liquids and soft foods.  By the  time of discharge, she was  starting to tolerate some solid foods and had  pretty good intake and finishing 45%-100% of her meals.  She was having  flatus and small bowel movements.  No nausea or vomiting.  Her pain was  controlled and she was weaned off of fluids.   Based on these improvements, we thought it reasonable to discharge her  home.   DISCHARGE INSTRUCTIONS:  1. She is to return to the clinic to see Dr. Ovidio Kin in about two      weeks for followup, to make sure she is continuing to do well.  2. She should use a Glucometer to check her blood sugars.  Hopefully      the sugars will remain in the 100's or less, as she is weaned back      down to her daily dose of 5 mg of prednisone.  Perhaps she could      benefit from being weaned off her steroids completely over a three      to six-month taper.  I will  defer to Dr. Pam Drown on this.      Perhaps an endocrine consultation would be warranted to help guide      that and see if she can get off of that, to help overall.  It      sounds like this has been attempted in the past, and I will defer      to her primary care physician on this.  3. She should call if she has any fevers, chills, sweats or drainage      from her incision, nausea, vomiting, worsening abdominal pain or      other concerns.  4. She should walk at least a total of 1/2 hour a day, broken up into      15-minute walks x2.  5. She should restart her MiraLax once daily, to have one soft bowel      movement daily.  To hold it if she has diarrhea.  6. She should have a low-fat diet with gradually increasing fiber in      her diet.  Again, have one soft bowel movement a day.   DISCHARGE MEDICATIONS:  1. MiraLax 17 grams p.o. once daily to twice daily, to have one soft      bowel movement daily.  2. Synthroid 750 mcg daily.  3. Zocor 20 mg daily.  4. Prednisone 5 mg daily.  5. Norvasc 5 mg daily.  6. Prilosec 20 mg daily.  7. Allegra 60 mg p.o. daily p.r.n. shortness of breath  or allergies.  8. Vitamin D and vitamin B.  9. Flovent inhaler, two puffs q.a.m. and q.p.m.  10.Combivent inhaler, two puffs q.4h. p.r.n. shortness of breath.  11.Tylenol and ibuprofen for mild to moderate pain.  12.Vicodin 5/100 mg, one to two p.o. q.4h. p.r.n. pain, #40 written.  13.Ice packs or heating pads p.r.n. pain.      Ardeth Sportsman, MD  Electronically Signed     SCG/MEDQ  D:  02/23/2007  T:  02/23/2007  Job:  [id number]   cc:   Pam Drown, M.D.  Fax: [phone]   Llana Aliment. Malon Kindle., M.D.  Fax: [phone]   Cassell Clement, M.D.  Fax: [phone]

## 2010-09-23 NOTE — Discharge Summary (Signed)
Sierra [name], Sierra [name]                ACCOUNT NO.:  [id number]   MEDICAL RECORD NO.:  [id number]          PATIENT TYPE:  INP   LOCATION:  1319                         [facility]ACILITY[facility]  [facility]   PHYSICIAN:  Sierra [name], MDDATE OF BIRTH:  Jun 01, 1928   DATE OF ADMISSION:  12/19/2006  DATE OF DISCHARGE:  12/24/2006                               DISCHARGE SUMMARY   PRIMARY CARE PHYSICIAN:  Sierra [name], M.D.   DISCHARGE DIAGNOSES:  1. Diverticulitis.  2. Diarrhea.  3. Splenic lesion, suspect hemangioma versus lymphangioma.   DISCHARGE MEDICATIONS:  1. Synthroid 75 mcg p.o. once daily.  2. Zocor 20 mg p.o. once daily.  3. Combivent inhaler 2 puffs q.4 h. p.r.n.  4. Flovent inhaler 110 mcg, two puffs b.i.d.  5. Prednisone 5 mg p.o. once daily.  6. Nexium 40 mg p.o. once daily.  7. Flagyl 500 mg p.o. t.i.d. for 10 days.   CONSULTATIONS:  Gastroenterology consult.  The patient was seen by the  GI physicians.  The patient has elected to have surgical treatment for  her recurrent diverticulitis.  She will discuss this (surgery) further  with Dr. Randa [name] in 2 weeks.   IMAGING STUDIES:  1. A CT scan of the abdomen that showed diverticulitis.  2. MRI of the spleen that revealed 1.6-cm subcapsular lesion in the      dome of the spleen.  A repeat MRI in the next 9-12 months was      recommended.  The MRI also showed bilateral renal cysts.   BRIEF HISTORY AND HOSPITAL COURSE:  Please refer to the H&P done on  December 19, 2006.  The patient is a 77 year old female with past medical  history significant for diverticulitis, hypothyroidism, steroid-induced  adrenal deficiency, history of brain tumor, status post resection, GERD,  and hypertension.  The patient presented with left sided abdominal pain  and bloody stool.  On admission, the patient's white count was 17.4.  A  CAT scan if the abdomen done on admission revealed possible  diverticulitis.   The patient was admitted to the regular  medical floor. The patient was  kept NPO.  She was started on IV Zosyn.  She was also adequately re-  hydrated.  With the above management, the patient's pain improved  significantly.  The bloody stools resolved.  White blood count came back  normal.  However, the patient developed diarrhea.  C. diff workup done  so far has been negative.  The patient will be discharged back home on  p.o. Flagyl for the next 10 days.   DISCHARGE PLANS:  1. Discharge the patient home today.  2. Follow up with Dr. Randa [name] in 2 weeks.  3. Follow with the primary care Sierra [name], Dr. Gweneth [name], in the      next one week.  4. Regular diet.  5. Activity as tolerated.  6. Flagyl 500 mg p.o. q.8 h. for the next 10 days.   Please note that this patient's Norvasc is on hold.  The primary care  Sierra [name] should please reassess the patient and decide if there is a  need to continue Norvasc.      Sierra Chapel, MD  Electronically Signed     SIO/MEDQ  D:  12/24/2006  T:  12/24/2006  Job:  [id number]   cc:   Sierra [name], M.D.  Fax: [phone]   Llana Aliment. Malon Kindle., M.D.  Fax: [phone]

## 2010-09-23 NOTE — H&P (Signed)
NAME:  Sierra [name], Sierra [name] NO.:  [id number]   MEDICAL RECORD NO.:  [id number]          PATIENT TYPE:  OUT   LOCATION:  ULT                           FACILITY:  [facility]   PHYSICIAN:  Hollice Espy, M.D.DATE OF BIRTH:  01/31/1929   DATE OF ADMISSION:  12/19/2006  DATE OF DISCHARGE:                              HISTORY & PHYSICAL   PRIMARY CARE PHYSICIAN:  Dr. [name].   CHIEF COMPLAINT:  Abdominal pain.   HISTORY OF PRESENT ILLNESS:  The patient is a 77 year old white female  with past medical history of diverticulitis, hypothyroidism, steroid  deficiency secondary to previous adrenal suppression from high-dose  steroids, history of brain tumor, status post resection, GERD and  hypertension, who has been treated for the last few days on outpatient  antibiotics for diverticulitis and has been followed by Dr. Madilyn Fireman of GI  and Dr. Corliss Blacker.  Despite this, she has continued to have problems with  severe left lower quadrant abdominal pain as well as some episodes of  bloody diarrhea and was referred over to the emergency room today.  In  the emergency room, she was noted to have by CT signs of diverticulitis  in the left lower quadrant, as well as a white count of 17.4 with an 89%  shift.  The patient received a dose of IV Zosyn in the emergency room,  as she has allergies to Cipro and Sulfa.  Currently, she is feeling a  little bit better.  She denies any headaches, vision changes, dysphagia,  chest pain, palpitations, shortness of breath, wheeze or cough.  She  does complain of some left lower quadrant abdominal pain, although it  has improved.  She also denies any nausea.  No vomiting.  No  constipation.  She had an episode of bloody diarrhea earlier in the  emergency room.  She denies any hematuria or dysuria.  No focal  extremity numbness, weakness or pain.  Review of systems otherwise  negative.   PAST MEDICAL HISTORY:  1. Status post appendectomy.  2.  Hysterectomy.  3. Thyroidectomy.  4. History of brain tumor removal.  5. She has a history of chronic adrenal suppression leading to chronic      steroid use.  6. History of cataract.  7. She has also got hypothyroidism, hyperlipidemia, hypertension and      GERD.   MEDICATIONS:  1. Synthroid 75 mcg p.o. daily.  2. Zocor 20 mg p.o. daily.  3. Allegra 180 mg p.o. daily p.r.n.  4. P.r.n. albuterol inhaler.  5. Ambien 5 mg p.o. nightly p.r.n.  6. Flovent inhaler 110 mcg b.i.d.  7. Prednisone 5 mg p.o. daily.  8. Norvasc 5 mg p.o. daily.  9. Nexium 40 mg p.o. daily.  10.Hydrochlorothiazide 12.5 mg p.o. daily.  11.She also takes a multivitamin, fish oil, vitamin B and Os-Cal D      b.i.d.   ALLERGIES:  She has allergies to CIPRO, SULFA and DILANTIN.   SOCIAL HISTORY:  She denies any tobacco, alcohol or drug use.   FAMILY HISTORY:  Noncontributory.   PHYSICAL EXAMINATION:  VITALS ON ADMISSION:  Temperature 97.7, heart  rate 101, now down to 77, blood pressure 144/73, respirations 20 and O2  SAT 92% on room air.  GENERAL:  The patient is alert and oriented x3, in no apparent distress.  HEENT:  Normocephalic, atraumatic.  Her mucous membranes are moist.  NECK:  She has no carotid bruits.  HEART:  Regular rate and rhythm.  S1 and S2.  A 2/6 systolic ejection  murmur.  LUNGS:  Clear to auscultation bilaterally.  ABDOMEN:  Soft.  Mildly tender in the left lower quadrant.  Mild  distention.  Hypoactive bowel sounds.  EXTREMITIES:  No clubbing, cyanosis or edema.   LABORATORY WORK:  CT scan of the abdomen and pelvis shows  diverticulitis.   White count 17.4, hemoglobin and hematocrit 13.3 and 38.6, MCV of 85,  platelet count 446,000 with 89% shift.  Sodium 130, potassium 3.4,  chloride 98, bicarb 29, BUN 15, creatinine 0.8, glucose 119.  LFTs are  unremarkable.  UA is unremarkable.  Stool for C. difficile cultures, ova  and parasites and lactoferrin is pending.   ASSESSMENT AND  PLAN:  1. Diverticulitis.  With the patient's allergies and review of the      literature, we will put her on Tygacil 500 mg q.12 h.  We will also      check a repeat CBC with differential in the morning.  2. Chronic adrenal suppression, on oral steroids.  This may be part of      her elevated white blood cell count and we will change her over to      intravenous Solu-Medrol 20 mg, which is the equivalent of      prednisone 5 mg.  3. Gastroesophageal reflux disease.  Continue Protonix.  4. Hypothyroidism.  Holding Synthroid.      Hollice Espy, M.D.  Electronically Signed     SKK/MEDQ  D:  12/19/2006  T:  12/20/2006  Job:  [id number]   cc:   Everardo All. Madilyn Fireman, M.D.  Pam Drown, M.D.

## 2010-09-23 NOTE — Op Note (Signed)
NAME:  KALIKA, SMAY NO.:  [id number]   MEDICAL RECORD NO.:  [id number]          PATIENT TYPE:  INP   LOCATION:  1222                         FACILITY:  [facility]   PHYSICIAN:  Sandria Bales. Ezzard Standing, M.D.  DATE OF BIRTH:  [birth year]   DATE OF PROCEDURE:  02/15/2007  DATE OF DISCHARGE:                               OPERATIVE REPORT   PREOPERATIVE DIAGNOSIS:  Diverticulitis.   POSTOPERATIVE DIAGNOSIS:  Acute and chronic diverticulitis of the  sigmoid colon.   OPERATION PERFORMED:  Laparoscopic converted to open sigmoid colectomy.   SURGEON:  Sandria Bales. Ezzard Standing, M.D.   ASSISTANT:  Anselm Pancoast. Zachery Dakins, M.D.   ANESTHESIA:  General endotracheal.   ESTIMATED BLOOD LOSS:  500 mL.   DRAINS:  None.   INDICATIONS FOR PROCEDURE:  Ms. Eppes is a 77 year old white female who  is a patient of Pam Drown, M.D., has been plagued with recurrent  diverticulitis going on for several months.  She was hospitalized at  [facility] in August of 2008 for diverticulitis.  She has had gastrointestinal bleeding associated with the  diverticulitis.  Dr. Randa Evens has colonoscoped her and saw evidence of  localized inflammation of her sigmoid colon at the area of  diverticulitis.  She is referred for discussions of proceeding with  colon surgery.   We spent a fair amount of time talking about the options of treatment  which include continued medical therapy versus surgical therapy.   The risks of surgical therapy were discussed witht the patient and her  husband.  These risk include, but are not limited to, bleeding,  infection, leak from the bowel.  That she is on chronic steroids  increases that risk of leaking.  Pulmonary complications and recurrent  diverticulitis even after the sigmoid is removed.  However, she has been  so plagued with the diverticulits, she is willing to go ahead with the  surgery.   DESCRIPTION OF PROCEDURE:  She completed a mechanical  antibiotic bowel  prep at home. She actually came in early to [facility] hospital because she was  having some nausea at home.  We gave her about a liter of intravenous  fluids in preparation of her surgery.  She was taken to the operating  room and given general endotracheal anesthetic.  A time out was held  identifying the patient and procedure.  An NG tube was placed.  A Foley  catheter was placed.  She was placed in stirrups in lithotomy position  with PAS stockings.  She was given a gram of cefoxitin at initiation of  procedure.   Her abdomen and perineum were prepped with Betadine solution.  I started  with infraumbilical incision and placed a 10 mm Hasson trocar and then a  5 mm in the right abdomen; however, on exploration, it was clear that  this inflammation was limited to her mid to distal sigmoid colon down in  her pelvis and that her left colon was pretty much spared.  I thought  there would be no additional benefit of trying to dissect out her left  colon.  Therefore, I converted to an open low midline incision.   The right and left lobes of liver were unremarkable. The stomach was  unremarkable.  The colon that I could see was unremarkable.  She also  had a lesion of her spleen but I was unable to at all be able to see  that.  She did have adhesions from a prior hysterectomy which I had to  take down upon entering the abdominal cavity.  I then got down to the  pelvis where she had an approximately 20 cm segment of very thickened,  acutely inflamed-looking bowel.  I went proximal to this which was about  the distal proximal third of her sigmoid colon and went distal to this.  I had to free up some adhesions in her pelvis which were stuck to where  her prior hysterectomy had been done at her vaginal cuff stuck to her  right ovary and tube which still remained.  I took down the mesentery,  using primarily 2-0 silk sutures.  I did use a little bit of a Ligasure  but her tissues were  so friable and inflamed, the Ligasure just did not  work very well.  I did not get out into the retroperitoneum or the  pelvis and thought I stayed well away from the ureter.  However, I did  not identify the ureters during the procedure.  Thinking that really  trying to dissect to the retroperitoneum would just add more bleeding  and more tissue injury.   Again, I really tried to stay up close to the colon wall and stayed up  in the mesentery of the sigmoid colon.  I got to where I was about 4 or  5 cm above the peritoneal reflection and I thought I found an area of  healthy bowel.  I divided the bowel proximally with a 75 Endo GIA  stapler.  I divided the valve distally with a right angle bowel clamp  and sent this to pathology with staples marking the proximal end.   I then carried out an end-to-end hand sewn anastomosis bringing the  sigmoid colon down to the rectosigmoid stump immediately above the  peritoneal reflection.  I used interrupted 2-0 silk sutures and actually  despite how poor her tissue seems, her sutures are well in her colon and  just really lay with what I thought was no tension.   I did an inverting 2-0 silk suture.  I did Gambee sutures in the  anterior wall.  After this was completed, Dr. Zachery Dakins went below where  he inserted a sigmoidoscope.  He saw an anastomosis at 12 to 14 cm.  While he insufflated with air I flooded the pelvis with saline and saw  no evidence of air bubbles or leak.   I thought that she had a healthy enough sigmoid colon that the diverting  colostomy would not be necessary.  With her steroids and other chromic  problems, I thought the diverting colostomy or diverting ileostomy would  provide its own set of problems and thought it was worth trying this  without a diversion.   The abdomen was then irrigated with 4 L of saline.  I took the omentum  and stuck it under the incision and tried to put it down in the pelvis.  I closed the  midline in two layers with the peritoneum over the bladder  with a #1 Vicryl suture and the fascia itself with a running #1 Novofil.  I used  Prolene suture because of her history of steroids and I really  was worried about wound healing and I stapled her skin closed.  The  patient tolerated the procedure well.   There was a fair amount of oozing in trying to dissect out this pelvis,  I am guessing a blood loss of 400 to 500 mL.  She did not get any blood  during the procedure.  The surfaces that had been cut looked clean and  dry at the end of the procedure.  Sponge and needle counts were correct  at the end of this case.  The patient tolerated the procedure well.      Sandria Bales. Ezzard Standing, M.D.  Electronically Signed     DHN/MEDQ  D:  02/15/2007  T:  02/16/2007  Job:  [id number]   cc:   Pam Drown, M.D.  Fax: [phone]   Llana Aliment. Malon Kindle., M.D.  Fax: [phone]   Cassell Clement, M.D.  Fax: [phone]   Clinton D. Maple Hudson, MD, FCCP, FACP  [facility]-Pulmonary [facility]  [address]  [place]  [place] [address]

## 2010-09-23 NOTE — Consult Note (Signed)
NAME:  KEYUNDRA, FANT NO.:  [id number]   MEDICAL RECORD NO.:  [id number]          PATIENT TYPE:  INP   LOCATION:  1319                         [facility]ACILITY:  [facility]   PHYSICIAN:  Graylin Shiver, M.D.   DATE OF BIRTH:  1929-04-18   DATE OF CONSULTATION:  12/20/2006  DATE OF DISCHARGE:                                 CONSULTATION   REASON FOR CONSULTATION:  The patient is a 77 year old old Caucasian  female who was admitted to the hospital yesterday with abdominal pain  and rectal bleeding.  The patient started to experience abdominal pain  earlier last week. She does have a history of diverticulosis of the  colon.  It was felt that perhaps her abdominal pain was due to  diverticulitis, and she was started on doxycycline, according to her  history.  She also started to have some rectal bleeding last week.  She  took the doxycycline, and finally the problem continued and she came to  the hospital emergency room with complaints of lower abdominal pain and  rectal bleeding.  She had a CT scan of the abdomen and pelvis done.  The  findings were compatible with uncomplicated diverticulitis of the  sigmoid colon.  She also had an enlarging splenic lesion when compared  to prior study dated back to 2005, and an MR was recommended to further  evaluate this.  It was felt that it could be an enlarging hemangioma.   The patient was started on antibiotics yesterday she is still having  some lower abdominal pain and still is experiencing some rectal  bleeding.  She is on Zosyn and Flagyl at this time IV.   PAST HISTORY:  1. History of a brain tumor in the past.  2. History of polycythemia vera, being followed by Dr. Myna Hidalgo.  3. Hypothyroidism.  4. Hyperlipidemia.  5. Hypertension.  6. GERD.   SURGERIES:  1. Appendectomy.  2. Hysterectomy.  3. Thyroidectomy.   MEDICATIONS PRIOR TO ADMISSION:  Synthroid, Zocor, Allegra, albuterol  inhaler p.r.n., Ambien, Flovent inhaler,  prednisone, Norvasc, Nexium,  hydrochlorothiazide, multivitamin.   ALLERGIES:  1. CIPRO.  2. SULFA.  3. DILANTIN.   SOCIAL HISTORY:  Does not smoke or drink alcohol.   SYSTEMS REVIEW:  No complaints of chest pain or shortness of breath.   PHYSICAL EXAMINATION:  VITAL SIGNS:  Stable.  GENERAL:  She is in no distress  HEENT:  Anicteric.  HEART:  Regular rhythm.  No murmurs.  LUNGS:  Clear.  ABDOMEN:  Bowel sounds are present.  It is soft.  There is some  tenderness in the lower abdomen, primarily the left lower quadrant.  No  rebound or guarding.   White blood cell count 18,300, hemoglobin 11.9   IMPRESSION:  1. Diverticulitis.  2. Probable low-grade diverticular bleed.  3. Enlarging splenic lesion, etiology unclear.   PLAN:  1. Continue Zosyn and metronidazole for treatment of diverticulitis.  2. Follow hemoglobin and hematocrit.  3. Follow-up MRI for splenic abnormality           ______________________________  Graylin Shiver, M.D.  SFG/MEDQ  D:  12/20/2006  T:  12/21/2006  Job:  [id number]   cc:   Dr. Lazaro Arms, M.D.  Fax: [phone]   Charlton Haws, M.D.   Rose Phi. Myna Hidalgo, M.D.  Fax: [phone]

## 2010-09-26 NOTE — Discharge Summary (Signed)
NAMEKATELY, GRAFFAM                ACCOUNT NO.:  [id number]   MEDICAL RECORD NO.:  [id number]          PATIENT TYPE:  INP   LOCATION:  1510                         FACILITY:  [facility]   PHYSICIAN:  Ollen Gross, M.D.    DATE OF BIRTH:  April 08, 1929   DATE OF ADMISSION:  02/09/2005  DATE OF DISCHARGE:  02/12/2005                                 DISCHARGE SUMMARY   ADMISSION DIAGNOSES:  1.  Osteoarthritis, right hip.  2.  Asthma.  3.  Bronchitis.  4.  Pneumonia.  5.  Hypertension.  6.  Heart murmur.  7.  Hiatal hernia.  8.  Reflux disease.  9.  Diverticulosis.  10. Hemorrhoids.  11. History of cystitis.  12. Postmenopausal.   DISCHARGE DIAGNOSES:  1.  Osteoarthritis, right hip, status post right total hip arthroplasty.  2.  Asthma.  3.  Bronchitis.  4.  Pneumonia.  5.  Hypertension.  6.  Heart murmur.  7.  Hiatal hernia.  8.  Reflux disease.  9.  Diverticulosis.  10. Hemorrhoids.  11. History of cystitis.  12. Postmenopausal.  13. Postoperative hyponatremia, improved.   PROCEDURE:  On February 09, 2005:  Right total hip.   SURGEON:  Ollen Gross, M.D.   ASSISTANT:  Alexzandrew L. Julien Girt, P.A.-C.   ANESTHESIA:  Spinal.   BRIEF HISTORY:  Ms. Mccleery is a 77 year old female with severe right hip  pain, severe arthritis which has been refractory to nonoperative management,  now presents for a total hip.   LABORATORY DATA:  Preop CBC done outpatient:  Hemoglobin 13.6, hematocrit  39.7.  Postop hemoglobin 11.9.  __________  13.4 and 1.2 preop.  Serial pro  times followed:  Last noted PT/INR 19.7 and 1.7.  Chem panel on admission  all within normal limits with the exception of low potassium at 3.4.  Serial  BMETs were followed.  Sodium did drop down to 131, back up to 136.  Potassium came up to 4.2.  Blood group type A+.   Preop EKG dated January 19, 2005:  Sinus rhythm.  Poor R wave progression.  Low QRS voltage.  Borderline EKG.   Chest x-ray dated January 19, 2005:  Cardiomegaly without edema,  emphysema, prominence of the right hilum.  This is probably vascular opacity  of the right cardiophrenic angle, probable pericardial fat pad.   HOSPITAL COURSE:  Admitted to [facility].  Tolerated the procedure  well.  Was started on PCA and p.o. analgesics.  Underwent bedrest but got  out of bed the following day.  Was doing pretty well on day #1.  Hemovac  drain pulled.  Had low potassium.  Started on K-Dur.  Weaned over to p.o.  meds.  PCA was actually discontinued on day #1.   By day #2, doing much better.  Had excellent pain control.  Started getting  up with physical therapy.  Ambulating 80 feet, then later 90 feet.  Dressing  was changed.  The incision looked excellent.  The potassium had improved.  Progressing well with physical therapy and felt to be ready to go home the  following day.  Did well with therapy.  Discharged on February 12, 2005.   DISCHARGE PLAN:  1.  Patient was discharged home on February 12, 2005.  2.  Discharge diagnoses:  Please see above.  3.  Discharge meds:  Coumadin, Percocet, Robaxin.  4.  Diet:  Diverticular, low sodium diet.  5.  Follow up in two weeks.  Call the office for an appointment.  6.  Activity:  Partial weightbearing 25-50% to right lower extremity.  Total      hip protocol.  Hip precautions.   DISPOSITION:  Home.   CONDITION ON DISCHARGE:  Improving.      Alexzandrew L. Julien Girt, P.A.      Ollen Gross, M.D.  Electronically Signed    ALP/MEDQ  D:  04/23/2005  T:  04/24/2005  Job:  [id number]   cc:   Ladell Pier, M.D.  Fax: [phone]   Clinton D. Maple Hudson, M.D.  [facility] Dept  [address]  [place]  [place] [address]   Cassell Clement, M.D.  Fax: [phone]

## 2010-09-26 NOTE — Op Note (Signed)
NAME:  Sierra [name], Sierra [name] NO.:  [id number]   MEDICAL RECORD NO.:  [id number]          PATIENT TYPE:  INP   LOCATION:  NA                           FACILITY:  [facility]   PHYSICIAN:  Ollen Gross, M.D.    DATE OF BIRTH:  Nov 24, 1928   DATE OF PROCEDURE:  02/09/2005  DATE OF DISCHARGE:                                 OPERATIVE REPORT   PREOPERATIVE DIAGNOSIS:  Osteoarthritis, right hip,   POSTOPERATIVE DIAGNOSIS:  Osteoarthritis, right hip,   PROCEDURE:  Right total hip arthroplasty.   SURGEON:  Dr. Despina Hick.   ASSISTANT:  Avel Peace.   ANESTHESIA:  Spinal.   ESTIMATED BLOOD LOSS:  100.   DRAINS:  Hemovac x1.   COMPLICATIONS:  None.   CONDITION:  Stable to recovery.   BRIEF CLINICAL NOTE:  Sierra [name] is a 77 year old female with severe right  hip pain and severe arthritis in the hip. She has had pain refractory to  nonoperative management and presents now for total hip arthroplasty.   PROCEDURE IN DETAIL:  After successful administration of spinal anesthetic,  the patient is placed in the left lateral decubitus position with the right  side up and held with the hip position. Right lower extremity was isolated  from her perineum with plastic drapes and prepped and draped in the usual  sterile fashion. Short posterolateral incision was made with a 10 blade  through the subcutaneous tissue to the level of the fascia lata which was  incised in line with the skin incision. Sciatica nerve was palpated and  protected and the short external rotators isolated off of the femur.  Capsulectomy was performed, and the hip was dislocated. We centered the  femoral head as marked and a trial prosthesis placed such that the center of  trial had corresponded to the center of the native femoral head. Osteotomy  lines marked on the femoral neck and osteotomy made with an oscillating saw.  Femur was then retracted anteriorly to gain acetabular exposure.   Acetabular reaming  starts at 45, increased in increments of 2 to 51 mm, and  then a 52-mm Pinnacle acetabular shell is impacted in anatomic position and  transfixed with two dome screws. Trial 32-mm neutral +4 liner was placed.   The femur was prepared with a canal finder and irrigation. Axial reaming was  performed to 13.5 mm, proximally reamed from an 18D in the sleeve machine to  a large. An 18D large trial sleeve was placed and 18 x 13 stem and a 36+8  neck. We placed a 32+0 head. I matched the native anteversion with the neck.  The hip was too tight. There was too much offset with a 36+8, so we went to  a 36 standard. I reduced it to a 36+0 head, and she had outstanding  stability, full extension, full external rotation, 70-degrees flexion, 40-  degrees adduction, 90-degrees internal rotation, and 90-degrees in flexion  and 70-degrees of internal rotation. By placing the right leg on top the  left, leg lengths were found to be equal. The trials were then removed.  The apex hole eliminator was placed into the acetabular shell, and the  permanent 32-mm neutral +4 Marathon liner was placed. The 18D large sleeve  was then placed into the proximal femur with the 18 x 13 stem and a 36  standard neck, once again matching her native anteversion. A 32+0 head was  placed, and the hip was reduced with the same stability parameters. The  wound was copiously irrigated with saline solution and the short rotators  reattached to the femur through drill holes. Fascia lata was closed over a  Hemovac drain with interrupted #1 Vicryls, subcu closed with #1 and 2-0  Vicryl, and subcuticular running 4-0 Monocryl. Incision was clean and dry.  Steri-Strips and a bulky sterile dressing applied. She was then awakened and  transported to recovery in stable condition.      Ollen Gross, M.D.  Electronically Signed     FA/MEDQ  D:  02/09/2005  T:  02/09/2005  Job:  [id number]

## 2010-09-26 NOTE — Assessment & Plan Note (Signed)
Sierra [name]                             Sierra [name]   Sierra [name], Sierra [name]                       Sierra [name]:          [id number]  Sierra [name]:07/12/2006                            Sierra [name]:          02-May-1929    PROBLEM:  1. Asthmatic bronchitis.  2. Allergic rhinitis.  3. Chronic pharyngitis/esophageal reflux.  4. Leukocytosis/thrombocytosis/microcytic anemia (Sierra [name]).  5. Chronic prednisone therapy.   Sierra [name]:  Sierra [name].   Sierra [name]:  Sierra [name].   HISTORY:  One year followup for Sierra [name].  She reports a good year  with no problems, going to the gym now 3 times a week.  Nasacort caused  epistaxis requiring cauterization of a granuloma.  Flovent 110 2 puffs  b.i.d. was associated with some thrush and she was given Diflucan.  She  now feels stable and comfortable.  She does remain on prednisone  following her craniotomy.   MEDICATIONS:  1. P.r.n. use of Allegra, Ambien, home nebulizer with Xopenex.  2. Flovent 110 2 puffs b.i.d.  3. Prednisone 5 mg.  4. Norvasc 5 mg b.i.d.  5. Caltrate with vitamin D.  6. Hydrochlorothiazide 12.5 mg.  7. Synthroid 0.75 mg.  8. Nexium 10 mg.  9. Zocor 20 mg.   ALLERGIES:  Drug intolerant of SULFA, DILANTIN, CIPRO, CODEINE.   OBJECTIVE:  Weight 124 pounds, blood pressure 122/80, pulse regular 90,  room air saturation 97%.  She is trim, looks very comfortable, heart  sounds regular without murmur.  Lung fields are clear with no cough or  wheeze.  Nasal airway seemed clear.   IMPRESSION:  Stable, controlled asthma.   PLAN:  Reduce Flovent 110 to 2 puffs once daily to reduce thrush risk.  Schedule return one year.  I want to arrange PFT on return.     Sierra D. Maple Hudson, MD, Sierra [name], FACP  Electronically Signed    CDY/MedQ  DD: 07/18/2006  DT: 07/18/2006  Job #: [id number]

## 2010-09-26 NOTE — H&P (Signed)
NAME:  Sierra [name], Sierra [name]                ACCOUNT NO.:  [id number]   MEDICAL RECORD NO.:  [id number]          PATIENT TYPE:  INP   LOCATION:  NA                           FACILITY:  [facility]   PHYSICIAN:  Ollen Gross, M.D.    DATE OF BIRTH:  Nov 09, 1928   DATE OF ADMISSION:  02/09/2005  DATE OF DISCHARGE:                                HISTORY & PHYSICAL   CHIEF COMPLAINT:  Right hip pain.   HISTORY OF PRESENT ILLNESS:  The patient is a 77 year old female seen by Dr.  Lequita Halt for ongoing hip pain.  She has known end-stage arthritis with  intractable pain.  The pain has continued to progressively get worse.  It is  felt that she has reached the point where she would benefit with undergoing  a hip replacement.  Risks and benefits of the procedure have been discussed  with the patient and she elects to proceed with surgery.   ALLERGIES:  1.  CIPRO.  2.  SULFA.  3.  CODEINE.  4.  DILANTIN.   CURRENT MEDICATIONS:  1.  Synthroid 0.75 mg.  2.  Allegra p.r.n.  3.  Albuterol inhaler.  4.  Ambien.  5.  Flovent inhaler.  6.  Prednisone.  7.  Norvasc.  8.  Albuterol.  9.  Nexium.  10. Fluconazole.  11. Hydrochlorothiazide.  12. Nasacort.  13. Other supplements including Centrum Silver.  14. Caltrate 600 plus D.  15. Advil or Tylenol Arthritis.  16. Fish oil.  17. Omega III.  18. Vitamin B complex.   PAST MEDICAL HISTORY:  1.  Asthma.  2.  History of bronchitis.  3.  History of pneumonia.  4.  Hypertension.  5.  Heart murmur.  6.  Hiatal hernia.  7.  Reflux disease.  8.  Diverticulosis.  9.  Hemorrhoids.  10. Cystitis.  11. Postmenopausal.   PAST SURGICAL HISTORY:  1.  Tonsillectomy.  2.  Appendectomy.  3.  Hysterectomy.  4.  Thyroidectomy.  5.  D&C after second child.  6.  Brain tumor excision.  7.  Cataract, right eye.  8.  Cataract, left eye.  9.  Colonoscopy.  10. Endoscopy.   SOCIAL HISTORY:  Married, retired Engineer, site, nonsmoker, no alcohol.   FAMILY  HISTORY:  Father deceased with heart disease and hypertension.  Mother deceased with a history of arthritis and hypertension.   REVIEW OF SYSTEMS:  GENERAL:  No fevers, chills, night sweats.  NEURO:  No  seizures or paralysis.  RESPIRATORY:  No shortness of breath, productive  cough, or hemoptysis.  CARDIOVASCULAR:  No chest pain or orthopnea.  GASTROINTESTINAL:  No nausea, vomiting, diarrhea, constipation.  GENITOURINARY:  No dysuria or hematuria.  MUSCULOSKELETAL:  Right hip as in  history of present illness.   PHYSICAL EXAMINATION:  VITAL SIGNS:  Pulse 68, respirations 12, blood  pressure 132/64.  GENERAL:  A 77 year old white female, well-developed, well-nourished, thin  frame, no acute distress.  Alert and cooperative.  Pleasant, accompanied by  her husband.  HEENT:  Normocephalic, atraumatic.  Pupils equal, round, and reactive to  light.  Oropharynx clear.  EOMs intact.  CHEST:  Clear anterior and posterior chest wall.  HEART:  Regular rate and rhythm with a grade 3/6 systolic ejection murmur  best heard at aortic and pulmonic and Herb's point.  ABDOMEN:  Soft, flat, and nontender.  Bowel sounds present.  INTEGUMENT:  Not done.  BREASTS:  Not done.  GENITOURINARY:  Not done.  EXTREMITIES:  Right hip shows flexion of about 90 degrees.  There is no  internal rotation with about 10 degrees of external rotation, and 20 degrees  of abduction.  Antalgic gait.   IMPRESSION:  1.  Osteoarthritis of right hip.  2.  History of asthma.  3.  History of bronchitis.  4.  History of pneumonia.  5.  Hypertension.  6.  Heart murmur.  7.  Hiatal hernia.  8.  Reflux disease.  9.  Diverticulosis.  10. Hemorrhoids.  11. History of cystitis.  12. Postmenopausal.   PLAN:  The patient is admitted to [facility] to undergo a right  total hip arthroplasty.  Surgery will be performed by Dr. Ollen Gross.      Alexzandrew L. Julien Girt, P.A.      Ollen Gross, M.D.   Electronically Signed    ALP/MEDQ  D:  02/08/2005  T:  02/09/2005  Job:  [id number]   cc:   Ollen Gross, M.D.  Fax: [phone]   Ladell Pier, M.D.  Fax: [phone]   Clinton D. Maple Hudson, M.D.  [facility] Dept  [address]  [place]  [place] [address]   Cassell Clement, M.D.  Fax: [phone]

## 2010-09-26 NOTE — Op Note (Signed)
NAME:  Sierra [name], Sierra [name]                          ACCOUNT NO.:  [id number]   MEDICAL RECORD NO.:  [id number]                   PATIENT TYPE:  AMB   LOCATION:  ENDO                                 FACILITY:  [facility]   PHYSICIAN:  James L. Malon Kindle., M.D.          DATE OF BIRTH:  04/23/1929   DATE OF PROCEDURE:  06/18/2003  DATE OF DISCHARGE:                                 OPERATIVE REPORT   PROCEDURE:  Colonoscopy and polypectomy.   MEDICATIONS:  The patient received a total of fentanyl 75 mcg and Versed 9  mg for both procedures.   SCOPE:  Olympus pediatric colonoscope.   INDICATION:  Abdominal pain, change in bowel habits.   DESCRIPTION OF PROCEDURE:  The procedure had been explained to the patient  and consent obtained.  With the patient in the left lateral decubitus  position, the Olympus pediatric scope was inserted and advanced.  The  patient had extensive diverticular disease.  After some time, we were able  to pass this area and rapidly advance to the cecum.  The scope was  withdrawn.  The cecum did reveal a 0.75 cm sessile polyp that was really  quite flat.  I elected to inject sterile saline which was done in 4  quadrants; 4 mL was used, and this was done to elevate the polyp.  It was  injected with a sclerotherapy needle around the polyp.  I then removed this  with the electrocautery snare.  There were no signs of bleeding at the  termination of the procedure.  The polyp was sucked through the scope.  The  scope was withdrawn.  The remainder of the cecum, ascending colon,  transverse colon, splenic flexure, and descending colon were seen well.  Minimal diverticular disease in the descending colon, extensive diverticular  disease in the sigmoid colon.  No signs of clinical diverticulitis.  The  scope was withdrawn, and the rectum was free of polyps or other lesions.  The patient tolerated the procedure well.   ASSESSMENT:  1. Cecal polyp, removed.  211.3.  2. Extensive  diverticulosis.  562.10.   PLAN:  1. Routine postpolypectomy instructions.  2. We will give diverticulosis information sheet and see back in the office     in 6-8 weeks.                                               James L. Malon Kindle., M.D.    [name]  D:  06/18/2003  T:  06/18/2003  Job:  [id number]   cc:   Ike Bene, M.D.  [address]  [place]  [place] [address]  Fax: [phone]   Sidney Ace, M.D. [facility]

## 2010-09-26 NOTE — Procedures (Signed)
[facility]  Patient:    Sierra [name], Sierra [name] Visit Number: [id number] MRN: [id number]          Service Type: END Location: ENDO Attending Physician:  Orland Mustard Dictated by:   Llana Aliment. Randa Evens, M.D. Proc. Date: 01/12/01 Admit Date:  01/12/2001   CC:         Modesta Messing, M.D., [facility], [facility]   Procedure Report  PROCEDURE:  Colonoscopy.  ENDOSCOPIST:  Llana Aliment. Edwards, M.D.  MEDICATIONS:  Fentanyl 100 mcg, Versed 9 mg IV.  SCOPE:  Olympus pediatric video colonoscope.  INDICATIONS:  Recent, severe, left-sided abdominal pain felt to be diverticulitis but with no previous history of colonic screening in a 77 year old.  DESCRIPTION OF PROCEDURE:  The procedure had been explained to the patient and consent obtained.  With the patient in the left lateral decubitus position, the pediatric video colonoscope was inserted following digital exam and advanced under direct visualization.  The prep was good.  The patient had extensive diverticular disease of the left colon.  Multiple maneuvers including position changes and abdominal pressure were required to get through the area.  Once we were able to get through there with pediatric scope, we were able to advance fairly rapidly into the cecum.  The ileocecal valve was identified.  The right lower quadrant was transilluminated.  The scope was withdrawn to the cecum.  Ascending colon, hepatic flexure, transverse colon, splenic flexure, descending and sigmoid colon were seen well upon removal. Again, marked diverticular disease of the sigmoid colon but no evidence endoscopically of active diverticulitis.  No polyps seen throughout the entire colon.  The scope was withdrawn.   The rectum was free of polyps.  The patient tolerated the procedure well.  The patient as maintained on low-flow oxygen and pulse oximetry throughout the procedure.  ASSESSMENT:  Marked  diverticulosis of the sigmoid colon with no evidence at this time of active diverticulitis.  I suspect this means that she clinically did have diverticulitis recently.  PLAN:  Will give her information about diverticulosis and fiber supplements. Will see her back in the office in three months. Dictated by:   Llana Aliment. Randa Evens, M.D. Attending Physician:  Orland Mustard DD:  01/12/01 TD:  01/12/01 Job: [id number]

## 2010-09-26 NOTE — Op Note (Signed)
NAME:  Sierra [name], Sierra [name]                          ACCOUNT NO.:  [id number]   MEDICAL RECORD NO.:  [id number]                   PATIENT TYPE:  AMB   LOCATION:  ENDO                                 FACILITY:  [facility]   PHYSICIAN:  Sierra [name]., M.D.          DATE OF BIRTH:  1929-03-05   DATE OF PROCEDURE:  06/18/2003  DATE OF DISCHARGE:                                 OPERATIVE REPORT   PROCEDURE:  Esophagogastroduodenoscopy and brushings.   MEDICATIONS:  1. Fentanyl 50 mcg.  2. Versed 5 mg IV.   INDICATIONS FOR PROCEDURE:  The patient with increasing dyspepsia.  Apparently, since I saw her in the office a week or so ago, she is having  asthma exacerbations and has been started on prednisone, been having some  pain when she swallows.   DESCRIPTION OF PROCEDURE:  The procedure had been explained to the patient  and consent obtained.  With the patient in the left lateral decubitus  position, the Olympus scope was inserted and advanced.  Immediately upon  entering the esophagus, there was a cheesy exudate throughout the upper part  of the esophagus extending down in the distal esophagus.  The very distal  esophagus actually was fairly endoscopically normal.  The stomach was  entered, pylorus identified and passed.  The duodenum, including the bulb  and second portion, were seen well and was unremarkable.  The scope was  withdrawn.  The duodenum, including the bulb and second portion, was normal.  No ulceration or inflammation.  The pyloric channel was normal.  The antrum  and body of the stomach were normal.  Fundus and cardia were seen well on  retroflexed view and were normal.  The scope was withdrawn.  Again, cheesy  exudate in the mid to proximal esophagus.  Brushings obtained for fungal  smear and culture.  The scope was withdrawn.  The patient tolerated the  procedure well.   ASSESSMENT:  Proximal esophagitis manifested by cheesy exudate, possibly  fungal.   PLAN:  1. We  will treat empirically with Mycelex Troches.  2. We will go ahead and start her on Nexium for reflux.  3. Check the results of the brushing and fungal culture.  Proceed with colonoscopy at this time, as planned, and we will see her back  in the office in 6 weeks.                                               Sierra [name]., M.D.    Sierra [name]  D:  06/18/2003  T:  06/18/2003  Job:  [id number]   cc:   Sierra [name], M.D.  [address]  [place]  [place] [address]  Fax: [phone]   Sierra [name], M.D.  [address]  [place], [place] [address]  Fax: [phone]   Sierra [name], M.D. [facility]

## 2010-09-26 NOTE — Op Note (Signed)
NAME:  LANEKA, MCGRORY                ACCOUNT NO.:  [id number]   MEDICAL RECORD NO.:  [id number]          PATIENT TYPE:  AMB   LOCATION:  DSC                          FACILITY:  [facility]   PHYSICIAN:  Katy Fitch. Sypher, M.D. DATE OF BIRTH:  12-18-1928   DATE OF PROCEDURE:  08/25/2005  DATE OF DISCHARGE:                                 OPERATIVE REPORT   PREOPERATIVE DIAGNOSIS:  Atypical right volar ganglion.   POSTOPERATIVE DIAGNOSIS:  Atypical right volar ganglion.   OPERATIONS:  Resection of a complex right volar ganglion with excision of  the superficial radial artery branch to the thenar eminence that was  intimately involved with the ganglion.   OPERATIONS:  Katy Fitch. Sypher, M.D.   ASSISTANT:  Marveen Reeks. Dasnoit, P.A.-C.   ANESTHESIA:  General by LMA.   SUPERVISING ANESTHESIOLOGIST:  Quita Skye. Krista Blue, M.D.   INDICATIONS:  Lilly Gasser is a 77 year old woman referred through the  courtesy of Dr. Corena Pilgrim and Dr. Oliver Barre for evaluation and management of  a mass on the volar aspect of her right wrist.  Clinical examination reveals  signs of an atypical ganglion that was quite prominent.  This was presenting  at the junction of the radial artery proper and its superficial branch of  the thenar eminence.  Ms. Reise had a history of osteoarthrosis and  probable pseudogout.  We have treated her in the past for acute crystal  arthropathy symptoms with success.  Plain films of her wrist demonstrated  chondrocalcinosis and mild degenerative changes at the carpus and  radiocarpal articulation.  Due to a failure to respond to non-operative  measures, she is brought to the operating room at this time for resection of  a right volar ganglion.   PROCEDURE:  Clela Hagadorn is brought to the operating room and placed in a  supine position on the operating table.  Following the induction of general  anesthesia by LMA technique, the right arm was prepped with Betadine soap  solution and  sterilely draped.  Following exsanguination of the right arm  with an Esmarch bandage, the arterial tourniquet was inflated to 220 mmHg.  The procedure commenced with an extended zig-zag incision exposing the  radial artery across the region of the scaphoid and distal radius.  The  subcutaneous tissues were carefully divided revealing a very prominent  portion the ganglion that was projecting 1.5 cm above the surface of the  volar wrist.  The superficial fascia was released and a complex ganglion  extending along the radial artery and its accompanying veins was noted.  This was circumferentially dissected, sparing the radial artery.  The  ganglion involved the wall of the superficial radial branch.  This was  dissected distally and followed to the thenar muscles.   Ultimately due to the intense involvement of the superficial branch, I  elected to sacrifice the branch with the cyst. Several arterial branches to  the region of the scaphoid were also involved with the cyst and were  sacrificed.  Bleeding points along the volar wrist capsule were  electrocauterized with bipolar current.  Care  was taken to explore the volar  aspect of the radiocarpal ligaments to identify any further fingers of the  cyst.  None were identified.  The wound was then irritated followed by  release of tourniquet.  There was no arterial bleeding noted.  Venous oozing  was controlled with bipolar cautery.  The wound was then irrigated  thoroughly and repaired with intradermal 3-0 Prolene and Steri-Strips.  There were no complications.   Ms. Woldt tolerated the surgery and anesthesia well.  She is transferred to  the recovery room with stable vital signs.      Katy Fitch Sypher, M.D.  Electronically Signed     RVS/MEDQ  D:  08/25/2005  T:  08/25/2005  Job:  [id number]   cc:   Corwin Levins, M.D. [facility]  [address]  [place]  [place] [address]   Gelene Mink A. Worthy Rancher, M.D.  Fax: [phone]

## 2010-10-09 HISTORY — PX: FRONTALIS SUSPENSION: SHX1688

## 2010-10-27 ENCOUNTER — Ambulatory Visit (INDEPENDENT_AMBULATORY_CARE_PROVIDER_SITE_OTHER)
Admission: RE | Admit: 2010-10-27 | Discharge: 2010-10-27 | Disposition: A | Discharge status: Home / Self Care | Payer: Medicare Other | Source: Ambulatory Visit | Attending: Internal Medicine | Admitting: Internal Medicine

## 2010-10-27 ENCOUNTER — Encounter: Payer: Self-pay | Admitting: Internal Medicine

## 2010-10-27 ENCOUNTER — Ambulatory Visit (INDEPENDENT_AMBULATORY_CARE_PROVIDER_SITE_OTHER): Payer: Medicare Other | Admitting: Internal Medicine

## 2010-10-27 VITALS — BP 106/72 | HR 75 | Ht 63.0 in | Wt 132.4 lb

## 2010-10-27 DIAGNOSIS — J42 Unspecified chronic bronchitis: Secondary | ICD-10-CM

## 2010-10-27 DIAGNOSIS — A319 Mycobacterial infection, unspecified: Secondary | ICD-10-CM

## 2010-10-27 NOTE — Assessment & Plan Note (Signed)
Good control now. Seasonal issues- winter colds- may be of primary importance.

## 2010-10-27 NOTE — Assessment & Plan Note (Signed)
We will update CXR for status of her MAIC.

## 2010-10-27 NOTE — Progress Notes (Signed)
  Subjective:    [name], female    DOB: [birth year], 77 y.o.   MRN: [id number]  HPI 10/27/10- 81 yoF followed for asthma complicated by allergic rhinitis, bronchitis, GERD, hx of atypical AFB. Allergy vaccine- Dr [place] Callas. Last here -June 27, 2010- PFTs reviewed at that visit. Since then has done very well with no significant flares. She feels getting away from winter viruses was key.  She just had blepharoplasty.   Review of Systems Constitutional:   No weight loss, night sweats,  Fevers, chills, fatigue, lassitude. HEENT:   No headaches,  Difficulty swallowing,  Tooth/dental problems,  Sore throat,                No sneezing, itching, ear ache, nasal congestion, post nasal drip,   CV:  No chest pain,  Orthopnea, PND, swelling in lower extremities, anasarca, dizziness, palpitations  GI  No heartburn, indigestion, abdominal pain, nausea, vomiting, diarrhea, change in bowel habits, loss of appetite  Resp: No shortness of breath with exertion or at rest.  No excess mucus, no productive cough,  No non-productive cough,  No coughing up of blood.  No change in color of mucus.  No wheezing.  Skin: no rash or lesions.  GU: no dysuria, change in color of urine, no urgency or frequency.  No flank pain.  MS:  No joint pain or swelling.  No decreased range of motion.  No back pain.  Psych:  No change in mood or affect. No depression or anxiety.  No memory loss.      Objective:   Physical Exam General- Alert, Oriented, Affect-appropriate, Distress- none acute  Skin- rash-none, lesions- none, excoriation- none  Lymphadenopathy- none  Head- atraumatic  Eyes- Gross vision intact, PERRLA, conjunctivae clear secretions  Ears- Hearing, canals, Tm- normal  Nose- Clear, No- Septal dev, mucus, polyps, erosion, perforation   Throat- Mallampati II , mucosa clear , drainage- none, tonsils- atrophic  Neck- flexible , trachea midline, no stridor , thyroid nl, carotid no  bruit  Chest - symmetrical excursion , unlabored     Heart/CV- RRR , no murmur , no gallop  , no rub, nl s1 s2                     - JVD- none , edema- none, stasis changes- none, varices- none     Lung- clear to P&A, wheeze- none, cough- none , dullness-none, rub- none     Chest wall-   Abd- tender-no, distended-no, bowel sounds-present, HSM- no  Br/ Gen/ Rectal- Not done, not indicated  Extrem- cyanosis- none, clubbing, none, atrophy- none, strength- nl  Neuro- grossly intact to observation         Assessment & Plan:

## 2010-10-27 NOTE — Patient Instructions (Signed)
CXR-  Dx chronic bronchitis, hx atypical AFB

## 2010-11-04 NOTE — Progress Notes (Signed)
Quick Note:  Spoke with patient-aware of results. ______ 

## 2010-11-21 ENCOUNTER — Telehealth: Payer: Self-pay | Admitting: Internal Medicine

## 2010-11-21 MED ORDER — AZITHROMYCIN 250 MG PO TABS: ORAL_TABLET | ORAL | Status: AC

## 2010-11-21 NOTE — Telephone Encounter (Signed)
Called, spoke with pt.  She c/o "asthma" - cough mainly in the mornings with green mucus and some wheezing x several days.  Denies increased SOB, chest tightness, f/c/s.  Using the albuterol hfa 2 puffs bid in place of flovent.  Requesting abx.  [facility].  Allergies verified.  Dr. Maple Hudson, pls advise.  Thanks!  Allergies  Allergen Reactions  . Ciprofloxacin   . Codeine   . Phenytoin   . Sulfonamide Derivatives

## 2010-11-21 NOTE — Telephone Encounter (Signed)
Per CDY: zpak #1 take as directed.  Offer prednisone taper to hold: 10mg  #20, 4x2 3x2 2x2 1x2.  Called spoke with patient, advised of CDY's recs.  Pt stated that she will begin with the zpak and if she finds she does not improve with the abx, she will taper her prednisone as instructed (pt takes 5mg  daily).  Offered to send rx to pt's pharmacy to have "on hold" but pt declined this and stated she will call back if needed for tapering instructions given the zpak is uneffective.  zpak sent to verified pharmacy.

## 2010-12-11 ENCOUNTER — Other Ambulatory Visit (HOSPITAL_COMMUNITY)
Admission: RE | Admit: 2010-12-11 | Discharge: 2010-12-11 | Disposition: A | Discharge status: Home / Self Care | Payer: Medicare Other | Source: Ambulatory Visit | Attending: Gastroenterology | Admitting: Gastroenterology

## 2010-12-11 DIAGNOSIS — B379 Candidiasis, unspecified: Secondary | ICD-10-CM | POA: Insufficient documentation

## 2010-12-12 ENCOUNTER — Other Ambulatory Visit: Payer: Self-pay | Admitting: Gastroenterology

## 2010-12-24 ENCOUNTER — Other Ambulatory Visit: Payer: Self-pay | Admitting: Sports Medicine

## 2010-12-24 ENCOUNTER — Ambulatory Visit
Admission: RE | Admit: 2010-12-24 | Discharge: 2010-12-24 | Disposition: A | Discharge status: Home / Self Care | Payer: Medicare Other | Source: Ambulatory Visit | Attending: Sports Medicine | Admitting: Sports Medicine

## 2010-12-24 DIAGNOSIS — M25561 Pain in right knee: Secondary | ICD-10-CM

## 2010-12-29 ENCOUNTER — Encounter: Payer: Self-pay | Admitting: *Deleted

## 2010-12-29 ENCOUNTER — Encounter: Payer: Medicare Other | Attending: Gastroenterology | Admitting: *Deleted

## 2010-12-29 DIAGNOSIS — Z713 Dietary counseling and surveillance: Secondary | ICD-10-CM | POA: Insufficient documentation

## 2010-12-29 DIAGNOSIS — K5190 Ulcerative colitis, unspecified, without complications: Secondary | ICD-10-CM | POA: Insufficient documentation

## 2010-12-29 NOTE — Progress Notes (Signed)
Medical Nutrition Therapy:  Appt start time: 10:30a end time:  11:45a.  Assessment:  Primary concerns today: Ulcerative Colitis.  Pt here with husband for MNT regarding UC. States she is confused on whether to eat low-fiber for IBS or low-fat for UC.  Reports GI discomfort with high fat foods, but usually no problems with lactose (if fat free).  Pt eats out often and high sodium/CHO intake noted.  Sometimes eats hamburgers and other high fat foods when out, though avoids all raw vegetables.  No abnormal weight loss noted.  Pt reports increasing fatigue since last bout of UC. ? if pt has low B-12??  Recent endoscopy and sigmoidoscopy performed by MD.  Results not available at time of visit.   MEDICATIONS: albuterol (PROVENTIL) (2.5 MG/3ML) 0.083% nebulizer solution  albuterol-ipratropium (COMBIVENT) 18-103 MCG/ACT inhaler  amLODipine (NORVASC) 5 MG tablet  B complex vitamins tablet  Calcium Citrate-Vitamin D (CITRACAL/VITAMIN D) 250-200 MG-UNIT TABS  Cholecalciferol (VITAMIN D) 2000 UNITS tablet  EPIPEN 2-PAK 0.3 MG/0.3ML DEVI  fexofenadine (ALLEGRA) 180 MG tablet  fluticasone (FLOVENT HFA) 110 MCG/ACT inhaler  levalbuterol (XOPENEX) 1.25 MG/3ML nebulizer solution  levothyroxine (SYNTHROID, LEVOTHROID) 75 MCG tablet  LORazepam (ATIVAN) 0.5 MG tablet  Multiple Vitamins-Calcium (ONE-A-DAY WOMENS PO)  Omega-3 Fatty Acids (FISH OIL) 1000 MG CAPS  omeprazole (PRILOSEC) 20 MG capsule  Polyethyl Glycol-Propyl Glycol (SYSTANE) 0.4-0.3 % SOLN  predniSONE (DELTASONE) 5 MG tablet  Spacer/Aero-Holding Chambers (AEROCHAMBER MV) inhaler  trimethoprim (TRIMPEX) 100 MG tablet    DIETARY INTAKE:  Usual eating pattern includes 3 meals and 0 snacks per day. 24-hr recall:  B ( AM): Cheerios and rice chex w/ skim milk, small bowl peaches; OR eggs, toast; coffee (decaf) or hot tea Snk ( AM): none reported  L ( PM): Sandwich w/ peanut butter or pimento cheese; unsweet decaf tea Snk ( PM): none reported D (  PM): Baked pork chop, baked potato, green beans or broccoli;  Snk ( PM): none reported  Usual physical activity: n/a  Estimated energy needs: 1100 calories 135-140 g carbohydrates 75 g protein 28-30 g fat  Progress Towards Goal(s):  NEW   Nutritional Diagnosis:  NB-1.1 Food and nutrition-related knowledge deficit related to appropriate diet for IBS and UC as evidenced by pt request for appropriate foods she can eat and MD referral for such education.    Intervention/Goals:  Follow the "Nutrition Therapy for Irritable Bowel Disease" handouts.  Aim for daily intake of: 1100 calories, 135-140 g carbs, 75 g protein, 28-30 g fat with nutrients spread over meals.  Aim for 15-20 g of fiber daily and avoid high oxalate foods (during remission of symptoms).  Add lean protein foods to all meals and snacks; incorporate more foods with probiotics (ex: Low fat Chobani greek yogurt).  Limit meals away from home to decrease sodium intake.  Aim for 20-30 mins of physical activity daily.  Drink 8 cups (64 oz) of fluid daily to prevent dehydration.  Monitoring/Evaluation:  Dietary intake, exercise, and body weight in 2 month(s).

## 2010-12-29 NOTE — Patient Instructions (Addendum)
Goals:  Follow the Nutrition Therapy for Irritable Bowel Disease handouts.  Aim for daily intake of: 1100 calories, 135-140 g carbs, 75 g protein, 28-30 g fat with nutrients spread over meals.  Aim for 15-20 g of fiber daily and avoid high oxalate foods (during remission of symptoms).  Add lean protein foods to all meals and snacks; incorporate more foods with probiotics (ex: Low fat Chobani greek yogurt).  Limit meals away from home to decrease sodium intake.  Aim for 20-30 mins of physical activity daily.  Drink 8 cups (64 oz) of fluid daily to prevent dehydration.

## 2011-02-03 ENCOUNTER — Telehealth: Payer: Self-pay | Admitting: *Deleted

## 2011-02-03 MED ORDER — MAGIC MOUTHWASH: ORAL | Status: DC

## 2011-02-03 NOTE — Telephone Encounter (Signed)
Received a fax from the pharmacy with a note attached stating the pt is c/o having thrush in her mouth and throat and she is requesting a refill on her MMW. Please advise if ok to refill. I did not see this medication on her med list. Thanks. Carron Curie, CMA Allergies  Allergen Reactions  . Ciprofloxacin   . Codeine   . Phenytoin   . Sulfonamide Derivatives

## 2011-02-03 NOTE — Telephone Encounter (Signed)
rx sent to pharm

## 2011-02-03 NOTE — Telephone Encounter (Signed)
Per CY-okay to give MMW #166ml S & S 1tsp qid no refills.

## 2011-02-05 ENCOUNTER — Other Ambulatory Visit: Payer: Self-pay | Admitting: Internal Medicine

## 2011-02-10 ENCOUNTER — Encounter: Payer: Medicare Other | Attending: Gastroenterology | Admitting: *Deleted

## 2011-02-10 DIAGNOSIS — K5190 Ulcerative colitis, unspecified, without complications: Secondary | ICD-10-CM | POA: Insufficient documentation

## 2011-02-10 DIAGNOSIS — Z713 Dietary counseling and surveillance: Secondary | ICD-10-CM | POA: Insufficient documentation

## 2011-02-10 NOTE — Progress Notes (Signed)
Medical Nutrition Therapy:  Appt start time: 10:30a end time:  11:45a.  Assessment:  Primary concerns today: Ulcerative Colitis.  Pt here with husband for MNT regarding UC. States she is confused on whether to eat low-fiber for IBS or low-fat for UC.  Reports GI discomfort with high fat foods, but usually no problems with lactose (if fat free).  Pt eats out often and high sodium/CHO intake noted.  Sometimes eats hamburgers and other high fat foods when out, though avoids all raw vegetables.  No abnormal weight loss noted.  Pt reports increasing fatigue since last bout of UC. ? if pt has low B-12??  Recent endoscopy and sigmoidoscopy performed by MD.  Results not available at time of visit.   MEDICATIONS: albuterol (PROVENTIL) (2.5 MG/3ML) 0.083% nebulizer solution  albuterol-ipratropium (COMBIVENT) 18-103 MCG/ACT inhaler  amLODipine (NORVASC) 5 MG tablet  B complex vitamins tablet  Calcium Citrate-Vitamin D (CITRACAL/VITAMIN D) 250-200 MG-UNIT TABS  Cholecalciferol (VITAMIN D) 2000 UNITS tablet  EPIPEN 2-PAK 0.3 MG/0.3ML DEVI  fexofenadine (ALLEGRA) 180 MG tablet  fluticasone (FLOVENT HFA) 110 MCG/ACT inhaler  levalbuterol (XOPENEX) 1.25 MG/3ML nebulizer solution  levothyroxine (SYNTHROID, LEVOTHROID) 75 MCG tablet  LORazepam (ATIVAN) 0.5 MG tablet  Multiple Vitamins-Calcium (ONE-A-DAY WOMENS PO)  Omega-3 Fatty Acids (FISH OIL) 1000 MG CAPS  omeprazole (PRILOSEC) 20 MG capsule  Polyethyl Glycol-Propyl Glycol (SYSTANE) 0.4-0.3 % SOLN  predniSONE (DELTASONE) 5 MG tablet  Spacer/Aero-Holding Chambers (AEROCHAMBER MV) inhaler  trimethoprim (TRIMPEX) 100 MG tablet    DIETARY INTAKE:  Usual eating pattern includes 3 meals and 0 snacks per day. 24-hr recall:  B ( AM): Cheerios and rice chex w/ skim milk, banana ; OR eggs, toast; coffee (decaf) or hot tea Snk ( AM): none reported  L ( PM): Sandwich w/ fat free peanut butter or pimento cheese; unsweet decaf tea Snk ( PM): none reported D (  PM): Baked pork chop, baked potato, green beans or broccoli Snk ( PM): none reported  Usual physical activity: n/a  Estimated energy needs: 1100 calories 135-140 g carbohydrates 75 g protein 28-30 g fat  Progress Towards Goal(s):  NEW   Nutritional Diagnosis:  NB-1.1 Food and nutrition-related knowledge deficit related to appropriate diet for IBS and UC as evidenced by pt request for appropriate foods she can eat and MD referral for such education.    Intervention/Goals:  Follow the "Nutrition Therapy for Irritable Bowel Disease" handouts.  Aim for daily intake of: 1100 calories, 135-140 g carbs, 75 g protein, 28-30 g fat with nutrients spread over meals.  Aim for 15-20 g of fiber daily and avoid high oxalate foods (during remission of symptoms).  Add lean protein foods to all meals and snacks; incorporate more foods with probiotics (ex: Low fat Chobani greek yogurt).  Limit meals away from home to decrease sodium intake.  Aim for 20-30 mins of physical activity daily.  Drink 8 cups (64 oz) of fluid daily to prevent dehydration.  Monitoring/Evaluation:  Dietary intake, exercise, and body weight in 2 month(s).

## 2011-02-10 NOTE — Patient Instructions (Addendum)
Goals:  Continue previous nutrition goals.   Limit meals away from home to decrease sodium intake.  Aim for 20-30 mins of aerobic activity 3 days a week (try water walking or the stationary bike).  If HgA1c or blood glucose is elevated at next MD visit, make follow up appointment.   Call with any problems.

## 2011-02-18 ENCOUNTER — Encounter: Payer: Self-pay | Admitting: Internal Medicine

## 2011-02-18 ENCOUNTER — Ambulatory Visit (INDEPENDENT_AMBULATORY_CARE_PROVIDER_SITE_OTHER): Payer: Medicare Other | Admitting: Internal Medicine

## 2011-02-18 VITALS — BP 120/64 | HR 86 | Ht 63.0 in | Wt 126.8 lb

## 2011-02-18 DIAGNOSIS — J309 Allergic rhinitis, unspecified: Secondary | ICD-10-CM

## 2011-02-18 DIAGNOSIS — J42 Unspecified chronic bronchitis: Secondary | ICD-10-CM

## 2011-02-18 NOTE — Patient Instructions (Signed)
Try reducing the Flovent to one puff just once daily. If you feel you are getting worse in your chest then just go back to twice daily

## 2011-02-18 NOTE — Progress Notes (Signed)
[name], female    DOB: 11-Aug-1928, 77 y.o.   MRN: [id number]  HPI 10/27/10- 81 yoF followed for asthma complicated by allergic rhinitis, bronchitis, GERD, hx of atypical AFB. Allergy vaccine- Dr [place] Callas. Last here -June 27, 2010- PFTs reviewed at that visit. Since then has done very well with no significant flares. She feels getting away from winter viruses was key.  She just had blepharoplasty.   02/18/11-  81 yoF followed for asthma complicated by allergic rhinitis, bronchitis, GERD, hx of atypical AFB. Allergy vaccine- Dr [place] Callas. She has gotten to the summer and early fall feeling very well. Really minimal chest tightness or nasal congestion. She has not been needing her nebulizer meds or rescue inhaler. We discussed the change in delivery system for Combivent. She will get one more of the cart style to keep available. She has not been needing antihistamines or nasal spray.   Review of Systems Constitutional:   No-   weight loss, night sweats, fevers, chills, fatigue, lassitude. HEENT:   No-  headaches, difficulty swallowing, tooth/dental problems, sore throat,       No-  sneezing, itching, ear ache, nasal congestion, post nasal drip,  CV:  No-   chest pain, orthopnea, PND, swelling in lower extremities, anasarca, dizziness, palpitations Resp: No-   shortness of breath with exertion or at rest.              No-   productive cough,  No non-productive cough,  No-  coughing up of blood.              No-   change in color of mucus.  No- wheezing.   Skin: No-   rash or lesions. GI:  No-   heartburn, indigestion, abdominal pain, nausea, vomiting, diarrhea,                 change in bowel habits, loss of appetite GU: No-   dysuria, change in color of urine, no urgency or frequency.  No- flank pain. MS:  No-   joint pain or swelling.  No- decreased range of motion.  No- back pain. Neuro-  Psych:  No- change in mood or affect. No depression or anxiety.  No memory  loss.       Objective:   Physical Exam General- Alert, Oriented, Affect-appropriate, Distress- none acute.  Looks a little different after blepharoplasty Skin- rash-none, lesions- none, excoriation- none Lymphadenopathy- none Head- atraumatic            Eyes- Gross vision intact, PERRLA, conjunctivae clear secretions            Ears- Hearing, canals-normal            Nose- Clear, no-Septal dev, mucus, polyps, erosion, perforation             Throat- Mallampati II , mucosa clear , drainage- none, tonsils- atrophic Neck- flexible , trachea midline, no stridor , thyroid nl, carotid no bruit Chest - symmetrical excursion , unlabored           Heart/CV- RRR , no murmur , no gallop  , no rub, nl s1 s2                           - JVD- none , edema- none, stasis changes- none, varices- none           Lung- clear to P&A, wheeze- none, cough- none , dullness-none, rub-  none           Chest wall-  Abd- tender-no, distended-no, bowel sounds-present, HSM- no Br/ Gen/ Rectal- Not done, not indicated Extrem- cyanosis- none, clubbing, none, atrophy- none, strength- nl Neuro- grossly intact to observation

## 2011-02-19 LAB — DIFFERENTIAL
Basophils Absolute: 0
Basophils Relative: 0
Basophils Relative: 1
Eosinophils Absolute: 0
Eosinophils Absolute: 0.1
Eosinophils Absolute: 0.2
Lymphs Abs: 0.9
Lymphs Abs: 1.9
Monocytes Relative: 4
Monocytes Relative: 9
Neutro Abs: 11 — ABNORMAL HIGH
Neutro Abs: 11.9 — ABNORMAL HIGH
Neutrophils Relative %: 78 — ABNORMAL HIGH
Neutrophils Relative %: 88 — ABNORMAL HIGH
Neutrophils Relative %: 88 — ABNORMAL HIGH

## 2011-02-19 LAB — CBC
HCT: 25.6 — ABNORMAL LOW
Hemoglobin: 13
Hemoglobin: 8.8 — ABNORMAL LOW
Hemoglobin: 9.5 — ABNORMAL LOW
MCHC: 34
MCHC: 34.2
MCHC: 34.2
MCHC: 34.3
MCHC: 34.3
MCV: 83.7
Platelets: 434 — ABNORMAL HIGH
Platelets: 470 — ABNORMAL HIGH
Platelets: 504 — ABNORMAL HIGH
RBC: 3.07 — ABNORMAL LOW
RBC: 3.29 — ABNORMAL LOW
RBC: 3.3 — ABNORMAL LOW
RDW: 14
RDW: 14.2 — ABNORMAL HIGH
RDW: 14.4 — ABNORMAL HIGH
WBC: 12.5 — ABNORMAL HIGH
WBC: 17.2 — ABNORMAL HIGH

## 2011-02-19 LAB — COMPREHENSIVE METABOLIC PANEL
ALT: 15
Albumin: 3.5
Calcium: 10.2
GFR calc Af Amer: >60
Glucose, Bld: 133 — ABNORMAL HIGH
Sodium: 141
Total Protein: 8.2

## 2011-02-19 LAB — BASIC METABOLIC PANEL
BUN: 6
CO2: 24
CO2: 25
CO2: 28
Calcium: 8.4
Calcium: 8.7
Calcium: 8.8
Calcium: 9
Chloride: 103
Chloride: 104
Creatinine, Ser: 0.65
GFR calc Af Amer: >60
GFR calc Af Amer: >60
Glucose, Bld: 125 — ABNORMAL HIGH
Glucose, Bld: 217 — ABNORMAL HIGH
Potassium: 3.6
Sodium: 133 — ABNORMAL LOW
Sodium: 136
Sodium: 137

## 2011-02-19 LAB — HEMOGLOBIN A1C
Hgb A1c MFr Bld: 6.5 — ABNORMAL HIGH
Mean Plasma Glucose: 154

## 2011-02-20 ENCOUNTER — Telehealth: Payer: Self-pay | Admitting: *Deleted

## 2011-02-20 ENCOUNTER — Encounter: Payer: Self-pay | Admitting: *Deleted

## 2011-02-20 NOTE — Assessment & Plan Note (Signed)
She has been stable for months now. Persistent wheezy bronchitis has stabilized and she is needing little medication.

## 2011-02-20 NOTE — Telephone Encounter (Signed)
Phone call

## 2011-02-20 NOTE — Assessment & Plan Note (Signed)
Good control now and able to wean off nasal steroid and antihistamine at least for now

## 2011-02-20 NOTE — Progress Notes (Signed)
Dispensed 2 boxes of Freestyle Lite strips (20 strips total) to pt. Will leave vm for pt to pick up.  Lot: [id number] Exp: 10/2011

## 2011-02-23 LAB — CBC
HCT: 31.6 — ABNORMAL LOW
HCT: 32.2 — ABNORMAL LOW
HCT: 38.6
Hemoglobin: 11 — ABNORMAL LOW
Hemoglobin: 11.9 — ABNORMAL LOW
MCHC: 34.3
MCHC: 34.5
MCV: 84.7
MCV: 84.9
Platelets: 377
Platelets: 401 — ABNORMAL HIGH
Platelets: 446 — ABNORMAL HIGH
RDW: 13.1
RDW: 13.1
RDW: 13.5
RDW: 13.6
WBC: 17.4 — ABNORMAL HIGH

## 2011-02-23 LAB — DIFFERENTIAL
Basophils Absolute: 0
Basophils Absolute: 0
Basophils Relative: 0
Basophils Relative: 0
Eosinophils Absolute: 0.3
Eosinophils Relative: 2
Monocytes Absolute: 0.7
Monocytes Absolute: 1.5 — ABNORMAL HIGH
Monocytes Relative: 4
Neutro Abs: 15.3 — ABNORMAL HIGH
Neutro Abs: 15.5 — ABNORMAL HIGH

## 2011-02-23 LAB — URINALYSIS, ROUTINE W REFLEX MICROSCOPIC
Leukocytes, UA: NEGATIVE
Protein, ur: NEGATIVE
Specific Gravity, Urine: 1.01
Urobilinogen, UA: 0.2

## 2011-02-23 LAB — CLOSTRIDIUM DIFFICILE EIA
C difficile Toxins A+B, EIA: NEGATIVE
C difficile Toxins A+B, EIA: NEGATIVE

## 2011-02-23 LAB — OVA AND PARASITE EXAMINATION

## 2011-02-23 LAB — COMPREHENSIVE METABOLIC PANEL
ALT: 15
AST: 17
Albumin: 3.7
Alkaline Phosphatase: 56
Chloride: 98
GFR calc Af Amer: >60
Potassium: 3.4 — ABNORMAL LOW
Sodium: 138
Total Bilirubin: 0.3
Total Protein: 8.1

## 2011-02-23 LAB — FECAL LACTOFERRIN, QUANT: Fecal Lactoferrin: POSITIVE

## 2011-02-23 LAB — URINE MICROSCOPIC-ADD ON

## 2011-02-23 LAB — BASIC METABOLIC PANEL
BUN: 11
Chloride: 104
Creatinine, Ser: 0.87
Glucose, Bld: 156 — ABNORMAL HIGH
Potassium: 3.4 — ABNORMAL LOW

## 2011-02-23 LAB — STOOL CULTURE

## 2011-02-27 ENCOUNTER — Ambulatory Visit: Payer: Self-pay | Admitting: Internal Medicine

## 2011-04-15 ENCOUNTER — Telehealth: Payer: Self-pay | Admitting: Internal Medicine

## 2011-04-15 NOTE — Telephone Encounter (Signed)
Spoke with pt and notified of recs per CDY. Pt verbalized understanding and denied any questions.

## 2011-04-15 NOTE — Telephone Encounter (Signed)
Spoke with pt. She states has had cough x 1 wk- was prod with green, thick sputum for a few days, but now dry and hacky. Has had some wheezing, but no change in her SOB. She states no fever or other complaints. Please advise, thanks! Allergies  Allergen Reactions  . Ciprofloxacin   . Codeine   . Phenytoin   . Sulfonamide Derivatives

## 2011-04-15 NOTE — Telephone Encounter (Signed)
Per CY-probably past where antibiotics needed suggest Mucinex DM and lots of fluids.

## 2011-04-23 ENCOUNTER — Other Ambulatory Visit: Payer: Self-pay | Admitting: Orthopedic Surgery

## 2011-04-27 ENCOUNTER — Ambulatory Visit (INDEPENDENT_AMBULATORY_CARE_PROVIDER_SITE_OTHER): Payer: Medicare Other | Admitting: Internal Medicine

## 2011-04-27 ENCOUNTER — Encounter: Payer: Self-pay | Admitting: Internal Medicine

## 2011-04-27 ENCOUNTER — Ambulatory Visit: Payer: Medicare Other | Admitting: Internal Medicine

## 2011-04-27 ENCOUNTER — Ambulatory Visit (INDEPENDENT_AMBULATORY_CARE_PROVIDER_SITE_OTHER)
Admission: RE | Admit: 2011-04-27 | Discharge: 2011-04-27 | Disposition: A | Discharge status: Home / Self Care | Payer: Medicare Other | Source: Ambulatory Visit | Attending: Internal Medicine | Admitting: Internal Medicine

## 2011-04-27 VITALS — BP 128/68 | HR 98 | Temp 97.8°F | Ht 63.0 in | Wt 127.8 lb

## 2011-04-27 DIAGNOSIS — J209 Acute bronchitis, unspecified: Secondary | ICD-10-CM

## 2011-04-27 DIAGNOSIS — J40 Bronchitis, not specified as acute or chronic: Secondary | ICD-10-CM

## 2011-04-27 MED ORDER — BENZONATATE 200 MG PO CAPS: 200.0000 mg | ORAL_CAPSULE | Freq: Three times a day (TID) | ORAL | Status: AC | PRN

## 2011-04-27 MED ORDER — DOXYCYCLINE HYCLATE 100 MG PO TABS: ORAL_TABLET | ORAL | Status: DC

## 2011-04-27 MED ORDER — LEVALBUTEROL HCL 1.25 MG/3ML IN NEBU: 1.0000 | INHALATION_SOLUTION | Freq: Four times a day (QID) | RESPIRATORY_TRACT | Status: AC | PRN

## 2011-04-27 NOTE — Progress Notes (Signed)
[name], female    DOB: 1929/02/12, 77 y.o.   MRN: [id number]  HPI 10/27/10- 77 yoF followed for asthma complicated by allergic rhinitis, bronchitis, GERD, hx of atypical AFB. Allergy vaccine- Dr [place]arn[place] Callas. Last here -June 27, 2010- PFTs reviewed at that visit. Since then has done very well with no significant flares. She feels getting away from winter viruses was key.  She just had blepharoplasty.   02/18/11-  77 yoF followed for asthma complicated by allergic rhinitis, bronchitis, GERD, hx of atypical AFB. Allergy vaccine- Dr [place] Callas. She has gotten to the summer and early fall feeling very well. Really minimal chest tightness or nasal congestion. She has not been needing her nebulizer meds or rescue inhaler. We discussed the change in delivery system for Combivent. She will get one more of the current style to keep available. She has not been needing antihistamines or nasal spray.  04/27/11-  77 yoF followed for asthma complicated by allergic rhinitis, bronchitis, GERD, hx of atypical AFB. Allergy vaccine- Dr [place] Callas. Hx brain tumor/ adrenal insufficiency.  Husband here. Has had flu vaccine. Did well until 10 days to 2 weeks ago. She had overnight onset of cough but seemed to improve until last night when she again got acutely ill with cough, clear foamy sputum, decreased appetite, sore throat, muscle aches. Temperature at home today was 100. She continues prednisone 5 mg daily for adrenal insufficiency after treatment for brain tumor. Due for hip replacement in March.  Review of Systems Constitutional:   No-   weight loss, night sweats, fevers, chills, fatigue, lassitude. HEENT:   No-  headaches, difficulty swallowing, tooth/dental problems, +sore throat,       No-  sneezing, itching, ear ache, nasal congestion, post nasal drip,  CV:  No-   chest pain, orthopnea, PND, swelling in lower extremities, anasarca, dizziness, palpitations Resp: No-   shortness of breath with  exertion or at rest.              No-   productive cough,  + non-productive cough,  No-  coughing up of blood.              No-   change in color of mucus.  No- wheezing.   Skin: No-   rash or lesions. GI:  No-   heartburn, indigestion, abdominal pain, nausea, vomiting, diarrhea,                 change in bowel habits, loss of appetite GU:. MS:  No-   joint pain or swelling.  No- decreased range of motion.  No- back pain. Neuro-  Psych:  No- change in mood or affect. No depression or anxiety.  No memory loss.       Objective:   Physical Exam General- Alert, Oriented, Affect-appropriate, Distress- none acute.  Skin- rash-none, lesions- none, excoriation- none Lymphadenopathy- none Head- atraumatic            Eyes- Gross vision intact, PERRLA, conjunctivae clear secretions            Ears- Hearing, canals-normal            Nose- Clear, no-Septal dev, mucus, polyps, erosion, perforation             Throat- Mallampati II , mucosa red , drainage- none, tonsils- atrophic Neck- flexible , trachea midline, no stridor , thyroid nl, carotid no bruit Chest - symmetrical excursion , unlabored  Heart/CV- RRR , no murmur , no gallop  , no rub, nl s1 s2                           - JVD- none , edema- none, stasis changes- none, varices- none           Lung- bilateral rhonchi, wheeze- none, cough- none , dullness-none, rub- none           Chest wall-  Abd- tender-no, distended-no, bowel sounds-present, HSM- no Br/ Gen/ Rectal- Not done, not indicated Extrem- cyanosis- none, clubbing, none, atrophy- none, strength- nl Neuro- grossly intact to observation

## 2011-04-27 NOTE — Patient Instructions (Addendum)
Order- CXR dx bronchitis  Script sent for nebulizer solution  Script sent for doxycycline and for benzonatate pearls

## 2011-04-28 ENCOUNTER — Telehealth: Payer: Self-pay | Admitting: Internal Medicine

## 2011-04-28 MED ORDER — AZITHROMYCIN 250 MG PO TABS: ORAL_TABLET | ORAL | Status: AC

## 2011-04-28 MED ORDER — PROMETHAZINE HCL 12.5 MG PO TABS: 12.5000 mg | ORAL_TABLET | Freq: Four times a day (QID) | ORAL | Status: AC | PRN

## 2011-04-28 NOTE — Telephone Encounter (Signed)
Per CY-okay to stop doxycyline and give Zpak #1 take as directed no refills.

## 2011-04-28 NOTE — Telephone Encounter (Signed)
I spoke with spouse and he states pt started the doxycyline given to her yesterday and it made her very nauseated. Spouse is requesting an alternative. Please advise Dr. Maple Hudson, thanks  Allergies  Allergen Reactions  . Ciprofloxacin   . Codeine   . Doxycycline     Very nauseated  . Phenytoin   . Sulfonamide Derivatives

## 2011-04-28 NOTE — Assessment & Plan Note (Signed)
Acute exacerbation of chronic bronchitis. Probably viral syndrome and we can expect it to be slow to clear. Plan-refill nebulizer solution,        -CXR

## 2011-04-28 NOTE — Telephone Encounter (Signed)
Called spoke with patient, advised of CDY's recs.  Pt okay with this and stated that she knows she can tolerate the zpak well.  rx sent to pharmacy.  Pt requesting something to help her with the nausea.  Reports it is improved today, but was vomiting last night and having fever and body aches today on top of the URI.  Advised patient to try some toast and/or crackers to help with the nausea in the meantime.    Dr Maple Hudson please advise if patient may have something for the nausea, thanks!

## 2011-04-28 NOTE — Telephone Encounter (Signed)
Per cy send phenergan 12.5 since 10 mg is not available. Rx has been sent to pharmacy.

## 2011-04-28 NOTE — Telephone Encounter (Signed)
Per CY-okay to give Phenergan 10 mg #5 take 1 every 6 hours prn nausea.

## 2011-04-29 ENCOUNTER — Telehealth: Payer: Self-pay | Admitting: Internal Medicine

## 2011-04-29 NOTE — Telephone Encounter (Signed)
Pt's spouse says the Xopenex HFA inahler has expired and pt's insurance will require a PA before they will pay for this medication. Pt will also have new insurance after January 1st and another Pa may be required. I will give the pt a sample for now. Sample left in the front office for pt to pick up.

## 2011-05-01 ENCOUNTER — Telehealth: Payer: Self-pay | Admitting: Internal Medicine

## 2011-05-01 MED ORDER — HYDROCODONE-HOMATROPINE 5-1.5 MG/5ML PO SYRP: 5.0000 mL | ORAL_SOLUTION | Freq: Four times a day (QID) | ORAL | Status: AC | PRN

## 2011-05-01 NOTE — Telephone Encounter (Signed)
Pt aware that Rx called to [facility] on [address].

## 2011-05-01 NOTE — Telephone Encounter (Signed)
Per CY-ok to give Hydrocodone cough syrup #200 ml 1 tsp every 6 hours prn cough no refills.

## 2011-05-01 NOTE — Telephone Encounter (Signed)
CY-pt's husband states pt is having cough that still lingering and would like to have some cough syrup called in. Hydrocodone is able please. Thanks.

## 2011-05-07 ENCOUNTER — Telehealth: Payer: Self-pay | Admitting: Internal Medicine

## 2011-05-07 MED ORDER — ALBUTEROL SULFATE (2.5 MG/3ML) 0.083% IN NEBU: 2.5000 mg | INHALATION_SOLUTION | Freq: Four times a day (QID) | RESPIRATORY_TRACT | Status: DC | PRN

## 2011-05-07 MED ORDER — AMOXICILLIN 500 MG PO CAPS: 500.0000 mg | ORAL_CAPSULE | Freq: Three times a day (TID) | ORAL | Status: AC

## 2011-05-07 NOTE — Telephone Encounter (Signed)
Per CY: albuterol 0.083% neb soln #100 1 qid prn refill prn and amoxicillin 500mg  #30 1 tid.  Called spoke with patient, advised of CDY's recs as stated above.  Pt verbalized her understanding.  rx's sent to verified pharmacy.

## 2011-05-07 NOTE — Telephone Encounter (Signed)
Called and spokew with pt and she c/o Chest congestion that is green, nasal congestion.  Tried taking the hydrocodone cough meds, xopenex inhaler.  Has not tried the nebulizer machine but her xopenex meds have expired and her insurance will not cover this med AND it costs her $300.  Nasal congestion is clear bloody and thick.  CY please advise. Thanks  Allergies  Allergen Reactions  . Ciprofloxacin   . Codeine   . Doxycycline     Very nauseated  . Phenytoin   . Sulfonamide Derivatives

## 2011-05-15 ENCOUNTER — Ambulatory Visit (INDEPENDENT_AMBULATORY_CARE_PROVIDER_SITE_OTHER)
Admission: RE | Admit: 2011-05-15 | Discharge: 2011-05-15 | Disposition: A | Discharge status: Home / Self Care | Payer: Medicare Other | Source: Ambulatory Visit | Attending: Internal Medicine | Admitting: Internal Medicine

## 2011-05-15 ENCOUNTER — Ambulatory Visit (INDEPENDENT_AMBULATORY_CARE_PROVIDER_SITE_OTHER): Payer: Medicare Other | Admitting: Internal Medicine

## 2011-05-15 ENCOUNTER — Encounter: Payer: Self-pay | Admitting: Internal Medicine

## 2011-05-15 ENCOUNTER — Other Ambulatory Visit: Payer: Self-pay | Admitting: Internal Medicine

## 2011-05-15 ENCOUNTER — Telehealth: Payer: Self-pay | Admitting: Internal Medicine

## 2011-05-15 VITALS — BP 126/72 | HR 103 | Ht 63.0 in | Wt 129.6 lb

## 2011-05-15 DIAGNOSIS — J40 Bronchitis, not specified as acute or chronic: Secondary | ICD-10-CM

## 2011-05-15 DIAGNOSIS — J209 Acute bronchitis, unspecified: Secondary | ICD-10-CM

## 2011-05-15 MED ORDER — PREDNISONE 10 MG PO TABS: ORAL_TABLET | ORAL | Status: DC

## 2011-05-15 MED ORDER — METHYLPREDNISOLONE ACETATE 80 MG/ML IJ SUSP
80.0000 mg | Freq: Once | INTRAMUSCULAR | Status: AC
  Administered 2011-05-15: 80 mg via INTRAMUSCULAR

## 2011-05-15 MED ORDER — MOXIFLOXACIN HCL 400 MG PO TABS: 400.0000 mg | ORAL_TABLET | Freq: Every day | ORAL | Status: AC

## 2011-05-15 MED ORDER — LEVALBUTEROL HCL 0.63 MG/3ML IN NEBU
0.6300 mg | INHALATION_SOLUTION | Freq: Once | RESPIRATORY_TRACT | Status: AC
  Administered 2011-05-15: 0.63 mg via RESPIRATORY_TRACT

## 2011-05-15 NOTE — Patient Instructions (Addendum)
Script Avelox sent- this might cause rash  Xopenex and Combivent are similar- meant as rescue inhalers used 2 puffs, up to 4 times daily if needed--   Use one or the other   Neb Xop 0.63  Depo 80  Script sent for prednisone taper

## 2011-05-15 NOTE — Telephone Encounter (Signed)
Pt given an appt today to see CY. Carron Curie, CMA

## 2011-05-15 NOTE — Progress Notes (Signed)
[name], female    DOB: 02/04/1929, 78 y.o.   MRN: [id number]  HPI 10/27/10- 78 yoF followed for asthma complicated by allergic rhinitis, bronchitis, GERD, hx of atypical AFB. Allergy vaccine- Dr [place] Callas. Last here -June 27, 2010- PFTs reviewed at that visit. Since then has done very well with no significant flares. She feels getting away from winter viruses was key.  She just had blepharoplasty.   02/18/11-  78 yoF followed for asthma complicated by allergic rhinitis, bronchitis, GERD, hx of atypical AFB. Allergy vaccine- Dr [place] Callas. She has gotten to the summer and early fall feeling very well. Really minimal chest tightness or nasal congestion. She has not been needing her nebulizer meds or rescue inhaler. We discussed the change in delivery system for Combivent. She will get one more of the current style to keep available. She has not been needing antihistamines or nasal spray.  04/27/11-  78 yoF followed for asthma complicated by allergic rhinitis, bronchitis, GERD, hx of atypical AFB. Allergy vaccine- Dr [place] Callas. Hx brain tumor/ adrenal insufficiency.  Husband here. Has had flu vaccine. Did well until 10 days to 2 weeks ago. She had overnight onset of cough but seemed to improve until last night when she again got acutely ill with cough, clear foamy sputum, decreased appetite, sore throat, muscle aches. Temperature at home today was 100. She continues prednisone 5 mg daily for adrenal insufficiency after treatment for brain tumor. Due for hip replacement in March.  05/15/11-  78 yoF followed for asthma complicated by allergic rhinitis, bronchitis, GERD, hx of atypical AFB. Allergy vaccine- Dr [place] Callas. Hx brain tumor/ adrenal insufficiency.  Husband is with her. She continues to wheeze with the exacerbation for which we saw her December 17. Sputum was cultured but is now foamy and white. She denies fever as she finishes amoxicillin. Sleeping on 3 pillows. Her nebulizer machine  made her too nervous. She had originally taken the Zithromax pack before amoxicillin. Doxycycline caused nausea and she had to stop. She has gone to 2 or 3 separate social functions that she felt she had to attend although she remains very tight in the chest with much cough and shortness of breath.  In her  Review of Systems Constitutional:   No-   weight loss, night sweats, fevers, chills, fatigue, lassitude. HEENT:   No-  headaches, difficulty swallowing, tooth/dental problems, +sore throat,       No-  sneezing, itching, ear ache, +nasal congestion, post nasal drip,  CV:  No-   chest pain, orthopnea, PND, swelling in lower extremities, anasarca, dizziness, palpitations Resp: +  shortness of breath with exertion or at rest.              +  productive cough,  + non-productive cough,  No-  coughing up of blood.              No-   change in color of mucus.  No- wheezing.   Skin: No-   rash or lesions. GI:  No-   heartburn, indigestion, abdominal pain, nausea, vomiting, diarrhea,                 change in bowel habits, loss of appetite GU:. MS:  No-   joint pain or swelling.  No- decreased range of motion.  No- back pain. Neuro-  Psych:  No- change in mood or affect. No depression or anxiety.  No memory loss.       Objective:   Physical  Exam General- Alert, Oriented, Affect-appropriate, Distress- Mild  Skin- rash-none, lesions- none, excoriation- none Lymphadenopathy- none Head- atraumatic            Eyes- Gross vision intact, PERRLA, conjunctivae clear secretions            Ears- Hearing, canals-normal            Nose- Clear, no-Septal dev, mucus, polyps, erosion, perforation             Throat- Mallampati II , mucosa red , drainage- none, tonsils- atrophic Neck- flexible , trachea midline, no stridor , thyroid nl, carotid no bruit Chest - symmetrical excursion , unlabored           Heart/CV- RRR , no murmur , no gallop  , no rub, nl s1 s2                           - JVD- none ,  edema- none, stasis changes- none, varices- none           Lung-active cough and some wheeze, dullness-none, rub- none           Chest wall-  Abd- tender-no, distended-no, bowel sounds-present, HSM- no Br/ Gen/ Rectal- Not done, not indicated Extrem- cyanosis- none, clubbing, none, atrophy- none, strength- nl Neuro- grossly intact to observation

## 2011-05-16 ENCOUNTER — Telehealth: Payer: Self-pay | Admitting: Internal Medicine

## 2011-05-16 NOTE — Telephone Encounter (Signed)
On call- Husband called. She slept better, feeling a little better. Afraid of Avelox side effects. Sputum not purulent now. Will not take Avelox. Will finish last 2 days of amoxacillin, and watch.

## 2011-05-18 NOTE — Assessment & Plan Note (Signed)
Subacute exacerbation of what is probably a viral rhinitis and asthmatic bronchitis. This has been her pattern each winter with virus chest colds that are very slow to clear. We're trying to keep her out of the hospital. Plan-Avelox, nebulized Xopenex, Depo-Medrol, prednisone taper, chest x-ray.

## 2011-05-21 ENCOUNTER — Telehealth: Payer: Self-pay | Admitting: Internal Medicine

## 2011-05-21 NOTE — Telephone Encounter (Signed)
Received copies from [facility] 05/21/11. Forwarded  12pages to Dr. Rudell Cobb review.

## 2011-05-26 ENCOUNTER — Telehealth: Payer: Self-pay | Admitting: *Deleted

## 2011-05-26 NOTE — Telephone Encounter (Signed)
I spoke with [name]; she said when she last talked to you you considered stopping her shots. She has transferred her records from Dr.Sharma's office to ours. His office just sent her new vials to [place] today. I told her usually since the vials have been paid for you'll have them take those shots and then (in her case) stop the vac.. She told me her ins.pays for it she's never gotten a bill. Please advise.

## 2011-05-28 NOTE — Telephone Encounter (Signed)
We had talked about whether to stop her shots. Since we have the vaccine from the other office, we can 1) give it here till gone, then stop 2) not give it at all, just stop now 3) continue allergy vaccine, transitioning into giving our vaccine  Please ask her what she would like to do.

## 2011-06-01 NOTE — Telephone Encounter (Signed)
I called Sierra [name] Thurs.before I left I was off Fri.. Mrs.Pinela has decided to stay off her vac for now. She's been off of it for two to two & half months.

## 2011-06-01 NOTE — Telephone Encounter (Signed)
Noted- please indicate allergy vaccine dc'd.

## 2011-06-02 NOTE — Telephone Encounter (Signed)
Pt.dropped off vac.03/2011. Right around Thanksgiving.

## 2011-07-09 ENCOUNTER — Other Ambulatory Visit: Payer: Self-pay | Admitting: Allergy

## 2011-07-09 MED ORDER — FEXOFENADINE HCL 180 MG PO TABS: ORAL_TABLET | ORAL | Status: DC

## 2011-07-09 NOTE — Telephone Encounter (Signed)
rx for allegra sent to [facility]  On [address]

## 2011-07-10 ENCOUNTER — Ambulatory Visit (INDEPENDENT_AMBULATORY_CARE_PROVIDER_SITE_OTHER): Payer: Medicare Other | Admitting: Internal Medicine

## 2011-07-10 ENCOUNTER — Encounter: Payer: Self-pay | Admitting: Internal Medicine

## 2011-07-10 ENCOUNTER — Telehealth: Payer: Self-pay | Admitting: Internal Medicine

## 2011-07-10 VITALS — BP 140/78 | HR 95 | Ht 63.0 in | Wt 126.8 lb

## 2011-07-10 DIAGNOSIS — J209 Acute bronchitis, unspecified: Secondary | ICD-10-CM

## 2011-07-10 DIAGNOSIS — J069 Acute upper respiratory infection, unspecified: Secondary | ICD-10-CM

## 2011-07-10 HISTORY — DX: Acute upper respiratory infection, unspecified: J06.9

## 2011-07-10 MED ORDER — AZITHROMYCIN 250 MG PO TABS: ORAL_TABLET | ORAL | Status: DC

## 2011-07-10 MED ORDER — PREDNISONE 10 MG PO TABS: ORAL_TABLET | ORAL | Status: DC

## 2011-07-10 MED ORDER — METHYLPREDNISOLONE ACETATE 80 MG/ML IJ SUSP
80.0000 mg | Freq: Once | INTRAMUSCULAR | Status: AC
  Administered 2011-07-10: 80 mg via INTRAMUSCULAR

## 2011-07-10 NOTE — Telephone Encounter (Signed)
Spoke with patient-she will be here at 315 pm to see CY today.

## 2011-07-10 NOTE — Progress Notes (Signed)
[name], female    DOB: Oct 31, 1928, 78 y.o.   MRN: [id number]  HPI 10/27/10- 81 yoF followed for asthma complicated by allergic rhinitis, bronchitis, GERD, hx of atypical AFB. Allergy vaccine- Dr [place] Callas. Last here -June 27, 2010- PFTs reviewed at that visit. Since then has done very well with no significant flares. She feels getting away from winter viruses was key.  She just had blepharoplasty.   02/18/11-  81 yoF followed for asthma complicated by allergic rhinitis, bronchitis, GERD, hx of atypical AFB. Allergy vaccine- Dr [place] Callas. She has gotten to the summer and early fall feeling very well. Really minimal chest tightness or nasal congestion. She has not been needing her nebulizer meds or rescue inhaler. We discussed the change in delivery system for Combivent. She will get one more of the current style to keep available. She has not been needing antihistamines or nasal spray.  04/27/11-  81 yoF followed for asthma complicated by allergic rhinitis, bronchitis, GERD, hx of atypical AFB. Allergy vaccine- Dr [place] Callas. Hx brain tumor/ adrenal insufficiency.  Husband here. Has had flu vaccine. Did well until 10 days to 2 weeks ago. She had overnight onset of cough but seemed to improve until last night when she again got acutely ill with cough, clear foamy sputum, decreased appetite, sore throat, muscle aches. Temperature at home today was 100. She continues prednisone 5 mg daily for adrenal insufficiency after treatment for brain tumor. Due for hip replacement in March.  05/15/11-  81 yoF followed for asthma complicated by allergic rhinitis, bronchitis, GERD, hx of atypical AFB. Allergy vaccine- Dr [place] Callas. Hx brain tumor/ adrenal insufficiency.  Husband is with her. She continues to wheeze with the exacerbation for which we saw her December 17. Sputum was cultured but is now foamy and white. She denies fever as she finishes amoxicillin. Sleeping on 3 pillows. Her nebulizer machine  made her too nervous. She had originally taken the Zithromax pack before amoxicillin. Doxycycline caused nausea and she had to stop. She has gone to 2 or 3 separate social functions that she felt she had to attend although she remains very tight in the chest with much cough and shortness of breath.  07/10/11-  81 yoF followed for asthma complicated by allergic rhinitis, bronchitis, GERD, hx of atypical AFB. Allergy vaccine- Dr [place] Callas. Hx brain tumor/ adrenal insufficiency.  Pending hip replacement March 18. Feeling mild chest and head congestion over the past 2 days with increased cough. Last night was wheezing. Has not needed her nebulizer machine and denies fever or malaise. Husband just had colon cancer resected.   Review of Systems Constitutional:   No-   weight loss, night sweats, fevers, chills, fatigue, lassitude. HEENT:   No-  headaches, difficulty swallowing, tooth/dental problems, +sore throat,       No-  sneezing, itching, ear ache, +nasal congestion, post nasal drip,  CV:  No-   chest pain, orthopnea, PND, swelling in lower extremities, anasarca, dizziness, palpitations Resp: +  shortness of breath with exertion or at rest.              +  productive cough,  + non-productive cough,  No-  coughing up of blood.              No-   change in color of mucus.  No- wheezing.   Skin: No-   rash or lesions. GI:  No-   heartburn, indigestion, abdominal pain, nausea, vomiting, diarrhea,  change in bowel habits, loss of appetite GU:. MS:  .  No- back pain. Neuro-  Psych:  No- change in mood or affect. No depression or anxiety.  No memory loss.       Objective:   Physical Exam General- Alert, Oriented, Affect-appropriate, Distress- Mild  Skin- rash-none, lesions- none, excoriation- none Lymphadenopathy- none Head- atraumatic            Eyes- Gross vision intact, PERRLA, conjunctivae clear secretions            Ears- Hearing, canals-normal            Nose- Clear, no-Septal  dev, mucus, polyps, erosion, perforation             Throat- Mallampati II , mucosa red , drainage- none, tonsils- atrophic Neck- flexible , trachea midline, no stridor , thyroid nl, carotid no bruit Chest - symmetrical excursion , unlabored           Heart/CV- RRR , no murmur , no gallop  , no rub, nl s1 s2                           - JVD- none , edema- none, stasis changes- none, varices- none           Lung-dry cough, no wheeze, dullness-none, rub- none           Chest wall-  Abd-  Br/ Gen/ Rectal- Not done, not indicated Extrem- cyanosis- none, clubbing, none, atrophy- none, strength- nl Neuro- grossly intact to observation

## 2011-07-10 NOTE — Telephone Encounter (Signed)
Pt returned call. Sierra [name] °

## 2011-07-10 NOTE — Patient Instructions (Signed)
Script sent for Z pak and prednisone taper  Depo 80  Use your home nebulizer if needed  Plenty of fluids.

## 2011-07-10 NOTE — Telephone Encounter (Signed)
[email or web address]  

## 2011-07-14 NOTE — Assessment & Plan Note (Signed)
Acute URI with bronchitis. She wants reassurance there will be clear before her surgery. Plan-Depo-Medrol, Z-Pak, fluids and rest.

## 2011-07-15 ENCOUNTER — Encounter (HOSPITAL_COMMUNITY): Payer: Self-pay | Admitting: Pharmacy Technician

## 2011-07-15 ENCOUNTER — Encounter (HOSPITAL_COMMUNITY): Payer: Self-pay

## 2011-07-15 ENCOUNTER — Other Ambulatory Visit: Payer: Self-pay

## 2011-07-15 ENCOUNTER — Ambulatory Visit (HOSPITAL_COMMUNITY): Admission: RE | Admit: 2011-07-15 | Payer: Medicare Other | Source: Ambulatory Visit

## 2011-07-15 ENCOUNTER — Ambulatory Visit (HOSPITAL_COMMUNITY)
Admission: RE | Admit: 2011-07-15 | Discharge: 2011-07-15 | Disposition: A | Discharge status: Home / Self Care | Payer: Medicare Other | Source: Ambulatory Visit | Attending: Orthopedic Surgery | Admitting: Orthopedic Surgery

## 2011-07-15 ENCOUNTER — Encounter (HOSPITAL_COMMUNITY)
Admission: RE | Admit: 2011-07-15 | Discharge: 2011-07-15 | Disposition: A | Discharge status: Home / Self Care | Payer: Medicare Other | Source: Ambulatory Visit | Attending: Orthopedic Surgery | Admitting: Orthopedic Surgery

## 2011-07-15 DIAGNOSIS — I10 Essential (primary) hypertension: Secondary | ICD-10-CM | POA: Insufficient documentation

## 2011-07-15 DIAGNOSIS — M169 Osteoarthritis of hip, unspecified: Secondary | ICD-10-CM | POA: Insufficient documentation

## 2011-07-15 DIAGNOSIS — Z01812 Encounter for preprocedural laboratory examination: Secondary | ICD-10-CM | POA: Insufficient documentation

## 2011-07-15 DIAGNOSIS — Z01818 Encounter for other preprocedural examination: Secondary | ICD-10-CM | POA: Insufficient documentation

## 2011-07-15 DIAGNOSIS — M1610 Unilateral primary osteoarthritis, unspecified hip: Secondary | ICD-10-CM | POA: Insufficient documentation

## 2011-07-15 DIAGNOSIS — Z01810 Encounter for preprocedural cardiovascular examination: Secondary | ICD-10-CM | POA: Insufficient documentation

## 2011-07-15 HISTORY — DX: Acute upper respiratory infection, unspecified: J06.9

## 2011-07-15 HISTORY — DX: Encounter for other specified aftercare: Z51.89

## 2011-07-15 HISTORY — DX: Reserved for inherently not codable concepts without codable children: IMO0001

## 2011-07-15 HISTORY — DX: Irritable bowel syndrome, unspecified: K58.9

## 2011-07-15 HISTORY — DX: Other abnormalities of gait and mobility: R26.89

## 2011-07-15 LAB — COMPREHENSIVE METABOLIC PANEL
ALT: 21 U/L (ref 0–35)
AST: 15 U/L (ref 0–37)
Alkaline Phosphatase: 57 U/L (ref 39–117)
CO2: 25 mEq/L (ref 19–32)
Calcium: 10.1 mg/dL (ref 8.4–10.5)
Chloride: 96 mEq/L (ref 96–112)
GFR calc Af Amer: 79 mL/min — ABNORMAL LOW (ref >90)
GFR calc non Af Amer: 68 mL/min — ABNORMAL LOW (ref >90)
Glucose, Bld: 247 mg/dL — ABNORMAL HIGH (ref 70–99)
Potassium: 3.7 mEq/L (ref 3.5–5.1)
Sodium: 134 mEq/L — ABNORMAL LOW (ref 135–145)
Total Bilirubin: 0.2 mg/dL — ABNORMAL LOW (ref 0.3–1.2)

## 2011-07-15 LAB — URINALYSIS, ROUTINE W REFLEX MICROSCOPIC
Glucose, UA: 500 mg/dL — AB
Ketones, ur: NEGATIVE mg/dL
Leukocytes, UA: NEGATIVE
Protein, ur: NEGATIVE mg/dL
pH: 5.5 (ref 5.0–8.0)

## 2011-07-15 LAB — APTT: aPTT: 29 seconds (ref 24–37)

## 2011-07-15 LAB — CBC
Hemoglobin: 13.4 g/dL (ref 12.0–15.0)
MCH: 27.3 pg (ref 26.0–34.0)
Platelets: 410 10*3/uL — ABNORMAL HIGH (ref 150–400)
RBC: 4.91 MIL/uL (ref 3.87–5.11)
WBC: 16.2 10*3/uL — ABNORMAL HIGH (ref 4.0–10.5)

## 2011-07-15 LAB — URINE MICROSCOPIC-ADD ON

## 2011-07-15 NOTE — Patient Instructions (Signed)
YOUR SURGERY IS SCHEDULED ON:  Monday  3/18  AT 3:45 PM  REPORT TO [place] AT:  1:15 PM      PHONE # FOR SHORT STAY IS [phone]  DO NOT EAT ANYTHING AFTER MIDNIGHT THE NIGHT BEFORE YOUR SURGERY.  YOU MAY HAVE CLEAR LIQUIDS TO DRINK FROM MIDNIGHT UNTIL 9:30 AM THE DAY OF YOUR SURGERY---BUT NOTHING TO DRINK AFTER 9:30 AM.   ( CLEAR LIQUIDS INCLUDING CRANBERRY OR APPLE JUICE, WATER, COFFEE WITH SUGAR - NO CREAM OR MILK PRODUCTS )  PLEASE TAKE THE FOLLOWING MEDICATIONS THE AM OF YOUR SURGERY WITH A FEW SIPS OF WATER:  NORVASC, SYNTHROID, PRILOSEC, PREDNISONE.  USE NEBULIZER AND AND INHALERS    IF YOU USE INHALERS--USE YOUR INHALERS THE AM OF YOUR SURGERY AND BRING INHALERS TO THE HOSPITAL -TAKE TO SURGERY.     IF YOU ARE DIABETIC:  DO NOT TAKE ANY DIABETIC MEDICATIONS THE AM OF YOUR SURGERY.  IF YOU TAKE INSULIN IN THE EVENINGS--PLEASE ONLY TAKE 1/2 NORMAL EVENING DOSE THE NIGHT BEFORE YOUR SURGERY.  NO INSULIN THE AM OF YOUR SURGERY.  IF YOU HAVE SLEEP APNEA AND USE CPAP OR BIPAP--PLEASE BRING THE MASK --NOT THE MACHINE-NOT THE TUBING   -JUST THE MASK. DO NOT BRING VALUABLES, MONEY, CREDIT CARDS.  CONTACT LENS, DENTURES / PARTIALS, GLASSES SHOULD NOT BE WORN TO SURGERY AND IN MOST CASES-HEARING AIDS WILL NEED TO BE REMOVED.  BRING YOUR GLASSES CASE, ANY EQUIPMENT NEEDED FOR YOUR CONTACT LENS. FOR PATIENTS ADMITTED TO THE HOSPITAL--CHECK OUT TIME THE DAY OF DISCHARGE IS 11:00 AM.  ALL INPATIENT ROOMS ARE PRIVATE - WITH BATHROOM, TELEPHONE, TELEVISION AND WIFI INTERNET. IF YOU ARE BEING DISCHARGED THE SAME DAY OF YOUR SURGERY--YOU CAN NOT DRIVE YOURSELF HOME--AND SHOULD NOT GO HOME ALONE BY TAXI OR BUS.  NO DRIVING OR OPERATING MACHINERY FOR 24 HOURS FOLLOWING ANESTHESIA / PAIN MEDICATIONS.                            SPECIAL INSTRUCTIONS:  CHLORHEXIDINE SOAP SHOWER (other brand names are Betasept and Hibiclens ) PLEASE SHOWER WITH CHLORHEXIDINE THE NIGHT BEFORE YOUR SURGERY  AND THE AM OF YOUR SURGERY. DO NOT USE CHLORHEXIDINE ON YOUR FACE OR PRIVATE AREAS--YOU MAY USE YOUR NORMAL SOAP THOSE AREAS AND YOUR NORMAL SHAMPOO.  WOMEN SHOULD AVOID SHAVING UNDER ARMS AND SHAVING LEGS 48 HOURS BEFORE USING CHLORHEXIDINE TO AVOID SKIN IRRITATION.  DO NOT USE IF ALLERGIC TO CHLORHEXIDINE.  PLEASE READ OVER ANY  FACT SHEETS THAT YOU WERE GIVEN: MRSA INFORMATION, BLOOD TRANSFUSION INFORMATION, INCENTIVE SPIROMETER INFORMATION.

## 2011-07-15 NOTE — Pre-Procedure Instructions (Addendum)
PT HAS COUGH TODAY-STATES SAW DR. C. YOUNG ON 07/10/11 FOR TX OF ACUTE BRONCHITIS--SHE HAS FINISHED Z-PAK, AND IS TAKING EXTRA PREDNISONE AS DIRECTED BY DR. Maple Hudson AND THEN WILL RESUME HER DAILY DOSE PREDNISONE--STATES NO FEVER.  CXR AND EKG WERE DONE TODAY PREOP--ALONG WITH PREOP LABS CBC, CMET, PT,PTT, UA.  T/S WILL BE DONE DAY OF SURGERY. NOTE OF MEDICAL CLEARANCE FOR SURGERY ON CHART FROM DR. ARONSON-FAXED BY DR. Deri Fuelling OFFICE.

## 2011-07-16 ENCOUNTER — Telehealth: Payer: Self-pay | Admitting: Internal Medicine

## 2011-07-16 MED ORDER — ALBUTEROL SULFATE (2.5 MG/3ML) 0.083% IN NEBU: 2.5000 mg | INHALATION_SOLUTION | Freq: Four times a day (QID) | RESPIRATORY_TRACT | Status: DC | PRN

## 2011-07-16 NOTE — Telephone Encounter (Signed)
Refill sent in and patient aware.

## 2011-07-17 ENCOUNTER — Telehealth: Payer: Self-pay | Admitting: Internal Medicine

## 2011-07-17 MED ORDER — AMOXICILLIN-POT CLAVULANATE 500-125 MG PO TABS: 1.0000 | ORAL_TABLET | Freq: Two times a day (BID) | ORAL | Status: AC

## 2011-07-17 NOTE — Telephone Encounter (Signed)
Pt finished Zpak and has one dose of Prednisone left.  Still c/o deep prod cough (Green), stuffy nose, chest congestion.  Denies fever.  Pt has surgery scheduled and wants to get this cleared prior to surgery.  Please advise. Allergies  Allergen Reactions  . Codeine Nausea And Vomiting  . Doxycycline     Very nauseated  . Restasis (Cyclosporine) Other (See Comments)    Eyes burn   . Ciprofloxacin Rash  . Phenytoin Rash  . Sulfonamide Derivatives Rash

## 2011-07-17 NOTE — Telephone Encounter (Signed)
Per CY-okay to give Augmentin 500mg  #20 take 1 po bid no refills.

## 2011-07-17 NOTE — Pre-Procedure Instructions (Signed)
MESSAGE LEFT ON ANSWERING MACHINE OF Sierra [name] AT DR. ALUISIO'S OFFICE TO PLEASE ASK DR. Lequita Halt TO REVIEW PT'S ABNORMAL CBC AND CMET REPORTS IN EPIC--WBC'S ELEVATED AT 16, 200 AND GLUCOSE 247-PT NOT DIABETIC--SHE IS ON PREDNISONE

## 2011-07-17 NOTE — Telephone Encounter (Signed)
Called, spoke with pt.  I informed her CDY recs she take augmentin 500mg  1 po bid - rx will be sent to [facility].  Advised if sxs worsen or do not improve to call office back or seek emergency care.  She verbalized understanding of these instructions and voiced no further questions/concerns at this time.

## 2011-07-21 ENCOUNTER — Telehealth: Payer: Self-pay | Admitting: Internal Medicine

## 2011-07-21 ENCOUNTER — Encounter: Payer: Self-pay | Admitting: Emergency Medicine

## 2011-07-21 ENCOUNTER — Ambulatory Visit (INDEPENDENT_AMBULATORY_CARE_PROVIDER_SITE_OTHER): Payer: Medicare Other | Admitting: Emergency Medicine

## 2011-07-21 VITALS — BP 120/76 | HR 113 | Temp 98.1°F | Ht 63.5 in | Wt 129.0 lb

## 2011-07-21 DIAGNOSIS — J312 Chronic pharyngitis: Secondary | ICD-10-CM

## 2011-07-21 NOTE — Assessment & Plan Note (Signed)
Flaring following apparent URI and resistant to rx with pred taper and also abx. Exacerbating factors are PND and GERD. She is taking allegra prn, omeprazole qd. Will try to ramp up rx of both factors to see if we can calm her throat down. - Start [place] qd - continue allegra - increase omeprazole to bid temporarily - continue pred 5 and finish the augmentin - not clear that this is her asthma, sounds like all UA symptoms, although she confuses the two and calls it all asthma. It would be tempting to give her a break from Flovent to see if it would calm her throat down, since this is the driving problem at this time. She will continue for now. Asked her to use her nebs prn and not on a schedule.  - she will call later this week to update on her progress ROV Dr Maple Hudson in 3 -4 weeks

## 2011-07-21 NOTE — Telephone Encounter (Signed)
Called and spoke with pt. Pt states she has been on the Amoxicillin that we sent in for her on march 8.  Pt states she "thought she was getting better."  But has had two "very severe asthma attacks."  One on Sunday and one today at lunch time.  She states the asthma attacks last for an hour.  She will do neb treatments which "help some."  Pt states she is still having a lot of chest and head congestion, PND, coughing up light green sputum, wheezing and tightness in chest and barking cough.  Pt has difficulty completing full sentences d/t her sob.  Cy has no appts avail today.  RB has an opening still for 4:30 today.  Would Cy be ok with RB seeing pt?  Please advise  Thanks!! Allergies  Allergen Reactions  . Codeine Nausea And Vomiting  . Doxycycline     Very nauseated  . Restasis (Cyclosporine) Other (See Comments)    Eyes burn   . Ciprofloxacin Rash  . Phenytoin Rash  . Sulfonamide Derivatives Rash

## 2011-07-21 NOTE — Progress Notes (Signed)
[name], female    DOB: 08-11-1928, 78 y.o.   MRN: [id number]  HPI 10/27/10- 81 yoF followed for asthma complicated by allergic rhinitis, bronchitis, GERD, hx of atypical AFB. Allergy vaccine- Dr [place] Callas. Last here -June 27, 2010- PFTs reviewed at that visit. Since then has done very well with no significant flares. She feels getting away from winter viruses was key.  She just had blepharoplasty.   02/18/11-  81 yoF followed for asthma complicated by allergic rhinitis, bronchitis, GERD, hx of atypical AFB. Allergy vaccine- Dr [place] Callas. She has gotten to the summer and early fall feeling very well. Really minimal chest tightness or nasal congestion. She has not been needing her nebulizer meds or rescue inhaler. We discussed the change in delivery system for Combivent. She will get one more of the current style to keep available. She has not been needing antihistamines or nasal spray.  04/27/11-  81 yoF followed for asthma complicated by allergic rhinitis, bronchitis, GERD, hx of atypical AFB. Allergy vaccine- Dr [place] Callas. Hx brain tumor/ adrenal insufficiency.  Husband here. Has had flu vaccine. Did well until 10 days to 2 weeks ago. She had overnight onset of cough but seemed to improve until last night when she again got acutely ill with cough, clear foamy sputum, decreased appetite, sore throat, muscle aches. Temperature at home today was 100. She continues prednisone 5 mg daily for adrenal insufficiency after treatment for brain tumor. Due for hip replacement in March.  05/15/11-  81 yoF followed for asthma complicated by allergic rhinitis, bronchitis, GERD, hx of atypical AFB. Allergy vaccine- Dr [place] Callas. Hx brain tumor/ adrenal insufficiency.  Husband is with her. She continues to wheeze with the exacerbation for which we saw her December 17. Sputum was cultured but is now foamy and white. She denies fever as she finishes amoxicillin. Sleeping on 3 pillows. Her nebulizer machine  made her too nervous. She had originally taken the Zithromax pack before amoxicillin. Doxycycline caused nausea and she had to stop. She has gone to 2 or 3 separate social functions that she felt she had to attend although she remains very tight in the chest with much cough and shortness of breath.  07/10/11-  81 yoF followed for asthma complicated by allergic rhinitis, bronchitis, GERD, hx of atypical AFB. Allergy vaccine- Dr [place] Callas. Hx brain tumor/ adrenal insufficiency.  Pending hip replacement March 18. Feeling mild chest and head congestion over the past 2 days with increased cough. Last night was wheezing. Has not needed her nebulizer machine and denies fever or malaise. Husband just had colon cancer resected.   Acute OV 07/21/11 -- 78 yo woman, hx asthma that has been labile and affected by allergies, GERD. 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 Presents today noting . She was just treated with Augmentin starting 3/8, finished a pred taper from 3/1 back down to chronic 5mg  qd. She is on flovent, combivent prn, nebulized albuterol. Also fexofenedine and omeprazole.  She has had bouts of severe cough, UA noise, wheezing. No fevers, chills, sputum, CP. She is having PND. No GERD sx.    Objective:   Physical Exam Filed Vitals:   07/21/11 1643  BP: 120/76  Pulse: 113  Temp: 98.1 F (36.7 C)   Gen: Pleasant, thin woman, in no distress,  normal affect, frequently clearing throat, harsh dry cough  ENT: No lesions,  mouth clear,  oropharynx clear, mild postnasal drip  Neck: No JVD, no TMG, no carotid bruits, significant  insp and exp strido  Lungs: No use of accessory muscles, no dullness to percussion, referred UA noise, some mild LL insp squeeks, no overt wheeze  Cardiovascular: RRR, heart sounds normal, no murmur or gallops, no peripheral edema  Musculoskeletal: No deformities, no cyanosis or clubbing  Neuro: alert, non focal  Skin: Warm, no lesions or rashes   PHARYNGITIS,  CHRONIC Flaring following apparent URI and resistant to rx with pred taper and also abx. Exacerbating factors are PND and GERD. She is taking allegra prn, omeprazole qd. Will try to ramp up rx of both factors to see if we can calm her throat down. - Start [place] qd - continue allegra - increase omeprazole to bid temporarily - continue pred 5 and finish the augmentin - not clear that this is her asthma, sounds like all UA symptoms, although she confuses the two and calls it all asthma. It would be tempting to give her a break from Flovent to see if it would calm her throat down, since this is the driving problem at this time. She will continue for now. Asked her to use her nebs prn and not on a schedule.  - she will call later this week to update on her progress ROV Dr Maple Hudson in 3 -4 weeks

## 2011-07-21 NOTE — Telephone Encounter (Signed)
Ok per CY for pt to be seen today by RB.    Called and spoke with pt. Pt agreed to come in today and be seen. Pt scheduled to see RB today at 4:30

## 2011-07-21 NOTE — Patient Instructions (Signed)
Please continue your Prednisone 5mg  every day Finish your Augmentin as prescribed by Dr Maple Hudson Continue your inhaled medications as you are taking them Continue your allegra (fexofenedine) every day  Start using nasal saline washes daily until your next visit Increase your Prilosec (omeprazole) to twice a day until your next visit Call our office this week if you are not feeling better.  Follow with Dr Maple Hudson in 3 -4 weeks

## 2011-07-26 ENCOUNTER — Other Ambulatory Visit: Payer: Self-pay | Admitting: Orthopedic Surgery

## 2011-07-26 NOTE — H&P (Signed)
Sierra [name]  DOB: April 11, 1929 Married / Language: English / Race: White / Female  Date of Admission:  07/27/2011  Chief Complaint:  Left hip pain  History of Present Illness The patient is a 78 year old female who comes in for a preoperative History and Physical. The patient is scheduled for a left total hip arthroplasty to be performed by Dr. Gus Rankin. Aluisio, MD at [facility] on 07/27/2011. The patient is a 78 year old female who presents for a recheck of Follow-up Hip. The patient is being followed for their left hip pain and osteoarthritis. Symptoms reported today include: pain. The patient feels that they are doing poorly and report their pain level to be moderate. Current treatment includes: pain medications. The following medication has been used for pain control: Ultram. The patient presents today following ultrasound guidedintra-articular injection with Dr. Penni Bombard . The patient has not gotten any relief of their symptoms with Cortisone injections. Sierra [name] states that this hip is starting to feel like the other one did prior to the time it needed to be replaced. She has noted some major increase in pain over the past several months. The patient had the intraarticular injection which helped for only a very short amount of time. The pain is limiting what she can and cannot do. It is keeping her up at night. She is having a hard time walking. She is having a hard time doing activities of daily living. Treatment to date has been analgesics medications, intraarticular injection and activity limitation. She has also started using a cane at times. She is now ready to proceed with surgery. They have been treated conservatively in the past for the above stated problem and despite conservative measures, they continue to have progressive pain and severe functional limitations and dysfunction. They have failed non-operative management including home exercise, medications, and injections.  It is felt that they would benefit from undergoing total joint replacement. Risks and benefits of the procedure have been discussed with the patient and they elect to proceed with surgery. There are no active contraindications to surgery such as ongoing infection or rapidly progressive neurological disease.  Allergies Codeine Phosphate *ANALGESICS - OPIOID*. Nausea. Dilantin *ANTICONVULSANTS* Cipro *FLUOROQUINOLONES*. Rash. Sulfa Drugs. Rash. Statins. Pain and arthralagis  Medication History  Synthroid ( Oral) Specific dose unknown - Active. Allegra (60MG  Tablet, 1-2 tabs Oral) Active. Combivent ( Inhalation) Specific dose unknown - Active. Ativan (0.5MG  Tablet, Oral as needed) Active. Flovent HFA (110MCG/ACT Aerosol, Inhalation) Active. PredniSONE (5MG  Tablet, Oral) Active. Norvasc (2.5MG  Tablet, Oral daily) Active. Albuterol Sulfate ( Inhalation) Specific dose unknown - Active. Omeprazole (20MG  Capsule DR, Oral) Active. Trimethoprim (100MG  Tablet, Oral) Active.  Problem List/Past Medical  Osteoarthritis, Hip (715.35) Hiatal Hernia Hemorrhoids Allergic rhinitis (477.9) Ulcerative Colitis Irritable bowel syndrome Heart murmur Gastroesophageal Reflux Disease Chronic Cystitis Hypothyroidism Hypercholesterolemia High blood pressure Asthma Chronic Obstructive Lung Disease Bronchitis Pneumonia Diverticulosis Measles Mumps Menopause History of Brain Tumor  Past Surgical History Colon Removal - Partial Hysterectomy (not due to cancer) - Complete Cataract Surgery. bilateral; Right Eye 01/11/2002, Left Eye 08/03/2002 Appendectomy Other Orthopaedic Surgery. right wrist cyst removed Total Hip Replacement - Right Thyroidectomy; Subtotal. Date: 1986. right Hysterectomy. Date: 80. complete (non-cancerous) Colon Polyp Removal - Colonoscopy Total Hip Replacement. right Tonsillectomy. Date: 48. Dilation and Curettage of Uterus. Date: 48. Brain Surgery. Date:  09/09/2000. Tumor Removal  Family History Hypertension. mother and father mother, father, sister and grandfather mothers side Heart disease in female family member before  age 16 Heart Disease. father Osteoporosis. mother Osteoarthritis. grandmother mothers side sister  Social History Alcohol use. current drinker; drinks wine; only occasionally per week current drinker; only occasionally per week Current work status. Retired. retired Copy of Drug/Alcohol Rehab (Previously). no Children. 2 Tobacco use. Never smoker. never smoker Tobacco / smoke exposure. no Exercise. does other Exercises daily; does other Number of flights of stairs before winded. less than 1 Marital status. married Living situation. live with spouse No alcohol use Post-Surgical Plans. Plan is to go home after the hospital stay. Advance Directives. Living Will, Healthcare POA  Review of Systems General:Present- Fatigue. Not Present- Chills, Fever, Night Sweats, Weight Gain, Weight Loss and Memory Loss. Skin:Not Present- Hives, Itching, Rash, Eczema and Lesions. HEENT:Not Present- Tinnitus, Headache, Double Vision, Visual Loss, Hearing Loss and Dentures. Respiratory:Present- Wheezing. Not Present- Shortness of breath with exertion, Shortness of breath at rest, Allergies, Coughing up blood and Chronic Cough. Cardiovascular:Not Present- Chest Pain, Racing/skipping heartbeats, Difficulty Breathing Lying Down, Murmur, Swelling and Palpitations. Gastrointestinal:Present- Heartburn. Not Present- Bloody Stool, Abdominal Pain, Vomiting, Nausea, Constipation, Diarrhea, Difficulty Swallowing, Jaundice and Loss of appetitie. Female Genitourinary:Not Present- Blood in Urine, Urinary frequency, Weak urinary stream, Discharge, Flank Pain, Incontinence, Painful Urination, Urgency, Urinary Retention and Urinating at Night. Musculoskeletal:Not Present- Muscle Weakness, Muscle Pain, Joint Swelling, Joint Pain,  Back Pain, Morning Stiffness and Spasms. Neurological:Not Present- Tremor, Dizziness, Blackout spells, Paralysis, Difficulty with balance and Weakness. Psychiatric:Not Present- Insomnia.  Vitals Weight: 122 lb Height: 63 in Body Surface Area: 1.57 m Body Mass Index: 21.61 kg/m Pulse: 92 (Regular) Resp.: 16 (Unlabored) BP: 142/68 (Sitting, Right Arm, Standard)  Physical Exam The physical exam findings are as follows: Patient is an 78 year old female with continued hip pain. Patient is accompanied today by her husband.   General Mental Status - Alert, cooperative and good historian. General Appearance- pleasant. Not in acute distress. Orientation- Oriented X3. Build & Nutrition- Well nourished and Well developed.   Head and Neck Head- normocephalic, atraumatic . Neck Global Assessment- supple. no bruit auscultated on the right and no bruit auscultated on the left.   Eye Pupil- Bilateral- Regular and Round. Note: wears glasses Motion- Bilateral- EOMI.   Chest and Lung Exam Auscultation: Breath sounds:- clear at anterior chest wall and - clear at posterior chest wall. Adventitious sounds:- No Adventitious sounds.   Cardiovascular Auscultation:Rhythm- Regular rate and rhythm. Heart Sounds- S1 WNL and S2 WNL. Murmurs & Other Heart Sounds: Murmur 1:Location- Aortic Area. Timing- Holosystolic. Grade- II/VI. Character- Holosystolic and Low pitched.   Abdomen Palpation/Percussion:Tenderness- Abdomen is non-tender to palpation. Rigidity (guarding)- Abdomen is soft. Auscultation:Auscultation of the abdomen reveals - Bowel sounds normal.   Female Genitourinary Not done, not pertinent to present illness  Musculoskeletal On examination, she is a well-developed female alert and oriented in no apparent distress. She has a significantly antalgic gait pattern on the left. She can be flexed to 90 degrees, no internal rotation, about  20 external rotation and 20 abduction. The right hip shows flexion to 110 degrees, rotation in 40, out 50, abduction 50 without discomfort.  RADIOGRAPHS: Radiographs from a couple of months ago AP pelvis and AP and lateral left hip show there is significant joint space narrowing just about bone-on-bone. There is some subchondral sclerosis in the femoral head. There is some osteophyte formation also at the margin of the joint.  Assessment & Plan Osteoarthritis Left Hip  Patient is for a Left Total Hip Replacement by Dr. Lequita Halt.  Plan is  to go home after the hospital stay.  PCP - Dr. Jacky Kindle - Patient has been seen by Dr. Jacky Kindle preoperatively and felt to be stable for surgery. Pulm - Dr. Maple Hudson Cards - Dr. Patty Sermons GI - Dr. Faylene Million, PA-C

## 2011-07-27 ENCOUNTER — Encounter (HOSPITAL_COMMUNITY): Payer: Self-pay | Admitting: Anesthesiology

## 2011-07-27 ENCOUNTER — Encounter (HOSPITAL_COMMUNITY)
Admission: RE | Disposition: A | Discharge status: Home Health | Payer: Self-pay | Source: Ambulatory Visit | Attending: Orthopedic Surgery

## 2011-07-27 ENCOUNTER — Encounter (HOSPITAL_COMMUNITY): Payer: Self-pay | Admitting: *Deleted

## 2011-07-27 ENCOUNTER — Inpatient Hospital Stay (HOSPITAL_COMMUNITY): Payer: Medicare Other | Admitting: Anesthesiology

## 2011-07-27 ENCOUNTER — Inpatient Hospital Stay (HOSPITAL_COMMUNITY): Payer: Medicare Other

## 2011-07-27 ENCOUNTER — Inpatient Hospital Stay (HOSPITAL_COMMUNITY)
Admission: RE | Admit: 2011-07-27 | Discharge: 2011-07-29 | DRG: 470 | Disposition: A | Discharge status: Home Health | Payer: Medicare Other | Source: Ambulatory Visit | Attending: Orthopedic Surgery | Admitting: Orthopedic Surgery

## 2011-07-27 DIAGNOSIS — M1610 Unilateral primary osteoarthritis, unspecified hip: Principal | ICD-10-CM | POA: Diagnosis present

## 2011-07-27 DIAGNOSIS — E7800 Pure hypercholesterolemia, unspecified: Secondary | ICD-10-CM | POA: Diagnosis present

## 2011-07-27 DIAGNOSIS — Z9049 Acquired absence of other specified parts of digestive tract: Secondary | ICD-10-CM

## 2011-07-27 DIAGNOSIS — E871 Hypo-osmolality and hyponatremia: Secondary | ICD-10-CM | POA: Diagnosis not present

## 2011-07-27 DIAGNOSIS — J449 Chronic obstructive pulmonary disease, unspecified: Secondary | ICD-10-CM | POA: Diagnosis present

## 2011-07-27 DIAGNOSIS — I10 Essential (primary) hypertension: Secondary | ICD-10-CM | POA: Diagnosis present

## 2011-07-27 DIAGNOSIS — J4489 Other specified chronic obstructive pulmonary disease: Secondary | ICD-10-CM | POA: Diagnosis present

## 2011-07-27 DIAGNOSIS — Z96649 Presence of unspecified artificial hip joint: Secondary | ICD-10-CM

## 2011-07-27 DIAGNOSIS — M169 Osteoarthritis of hip, unspecified: Secondary | ICD-10-CM | POA: Diagnosis present

## 2011-07-27 DIAGNOSIS — E039 Hypothyroidism, unspecified: Secondary | ICD-10-CM | POA: Diagnosis present

## 2011-07-27 HISTORY — PX: TOTAL HIP ARTHROPLASTY: SHX124

## 2011-07-27 LAB — TYPE AND SCREEN: ABO/RH(D): A POS

## 2011-07-27 LAB — GLUCOSE, CAPILLARY: Glucose-Capillary: 109 mg/dL — ABNORMAL HIGH (ref 70–99)

## 2011-07-27 SURGERY — ARTHROPLASTY, HIP, TOTAL,POSTERIOR APPROACH
Anesthesia: Spinal | Site: Hip | Laterality: Left | Wound class: Clean

## 2011-07-27 MED ORDER — METHOCARBAMOL 100 MG/ML IJ SOLN
500.0000 mg | Freq: Four times a day (QID) | INTRAVENOUS | Status: DC | PRN
  Filled 2011-07-27: qty 5

## 2011-07-27 MED ORDER — ONDANSETRON HCL 4 MG/2ML IJ SOLN: 4.0000 mg | Freq: Four times a day (QID) | INTRAMUSCULAR | Status: DC | PRN

## 2011-07-27 MED ORDER — LORATADINE 10 MG PO TABS
10.0000 mg | ORAL_TABLET | Freq: Every day | ORAL | Status: DC
  Administered 2011-07-28 – 2011-07-29 (×2): 10 mg via ORAL
  Filled 2011-07-27 (×2): qty 1

## 2011-07-27 MED ORDER — DOCUSATE SODIUM 100 MG PO CAPS
100.0000 mg | ORAL_CAPSULE | Freq: Two times a day (BID) | ORAL | Status: DC
  Administered 2011-07-28 – 2011-07-29 (×3): 100 mg via ORAL
  Filled 2011-07-27 (×3): qty 1

## 2011-07-27 MED ORDER — CEFAZOLIN SODIUM 1-5 GM-% IV SOLN
1.0000 g | INTRAVENOUS | Status: AC
  Administered 2011-07-27: 1 g via INTRAVENOUS

## 2011-07-27 MED ORDER — LACTATED RINGERS IV SOLN
INTRAVENOUS | Status: DC
  Administered 2011-07-27: 18:00:00 via INTRAVENOUS
  Administered 2011-07-27: 1000 mL via INTRAVENOUS

## 2011-07-27 MED ORDER — FLUTICASONE PROPIONATE HFA 110 MCG/ACT IN AERO
1.0000 | INHALATION_SPRAY | Freq: Two times a day (BID) | RESPIRATORY_TRACT | Status: DC
Start: 2011-07-27 — End: 2011-07-29
  Administered 2011-07-28 – 2011-07-29 (×3): 1 via RESPIRATORY_TRACT
  Filled 2011-07-27: qty 12

## 2011-07-27 MED ORDER — POTASSIUM CHLORIDE IN NACL 20-0.9 MEQ/L-% IV SOLN
INTRAVENOUS | Status: DC
  Administered 2011-07-28: 02:00:00 via INTRAVENOUS
  Filled 2011-07-27 (×2): qty 1000

## 2011-07-27 MED ORDER — IPRATROPIUM-ALBUTEROL 18-103 MCG/ACT IN AERO: 2.0000 | INHALATION_SPRAY | Freq: Four times a day (QID) | RESPIRATORY_TRACT | Status: DC | PRN

## 2011-07-27 MED ORDER — MIDAZOLAM HCL 5 MG/5ML IJ SOLN
INTRAMUSCULAR | Status: DC | PRN
  Administered 2011-07-27 (×2): 1 mg via INTRAVENOUS

## 2011-07-27 MED ORDER — ACETAMINOPHEN 650 MG RE SUPP: 650.0000 mg | Freq: Four times a day (QID) | RECTAL | Status: DC | PRN

## 2011-07-27 MED ORDER — MORPHINE SULFATE 2 MG/ML IJ SOLN
INTRAMUSCULAR | Status: AC
  Filled 2011-07-27: qty 1

## 2011-07-27 MED ORDER — HYDROMORPHONE HCL PF 1 MG/ML IJ SOLN
INTRAMUSCULAR | Status: AC
  Filled 2011-07-27: qty 1

## 2011-07-27 MED ORDER — 0.9 % SODIUM CHLORIDE (POUR BTL) OPTIME
TOPICAL | Status: DC | PRN
  Administered 2011-07-27: 1000 mL

## 2011-07-27 MED ORDER — METHOCARBAMOL 500 MG PO TABS
500.0000 mg | ORAL_TABLET | Freq: Four times a day (QID) | ORAL | Status: DC | PRN
  Administered 2011-07-27 – 2011-07-29 (×5): 500 mg via ORAL
  Filled 2011-07-27 (×6): qty 1

## 2011-07-27 MED ORDER — HYDROMORPHONE HCL 2 MG PO TABS
2.0000 mg | ORAL_TABLET | ORAL | Status: DC | PRN
  Administered 2011-07-28 (×5): 2 mg via ORAL
  Filled 2011-07-27 (×6): qty 1

## 2011-07-27 MED ORDER — DEXAMETHASONE SODIUM PHOSPHATE 10 MG/ML IJ SOLN
10.0000 mg | Freq: Once | INTRAMUSCULAR | Status: AC
  Administered 2011-07-27: 10 mg via INTRAVENOUS

## 2011-07-27 MED ORDER — PROPOFOL 10 MG/ML IV EMUL
INTRAVENOUS | Status: DC | PRN
  Administered 2011-07-27: 50 ug/kg/min via INTRAVENOUS
  Administered 2011-07-27: 75 ug/kg/min via INTRAVENOUS

## 2011-07-27 MED ORDER — ALBUTEROL SULFATE (5 MG/ML) 0.5% IN NEBU: 2.5000 mg | INHALATION_SOLUTION | Freq: Four times a day (QID) | RESPIRATORY_TRACT | Status: DC | PRN

## 2011-07-27 MED ORDER — DIPHENHYDRAMINE HCL 12.5 MG/5ML PO ELIX: 12.5000 mg | ORAL_SOLUTION | ORAL | Status: DC | PRN

## 2011-07-27 MED ORDER — PANTOPRAZOLE SODIUM 40 MG PO TBEC
40.0000 mg | DELAYED_RELEASE_TABLET | Freq: Every day | ORAL | Status: DC
  Administered 2011-07-28: 40 mg via ORAL

## 2011-07-27 MED ORDER — BUPIVACAINE HCL (PF) 0.5 % IJ SOLN
INTRAMUSCULAR | Status: AC
  Filled 2011-07-27: qty 30

## 2011-07-27 MED ORDER — BUPIVACAINE LIPOSOME 1.3 % IJ SUSP
INTRAMUSCULAR | Status: DC | PRN
  Administered 2011-07-27: 20 mL

## 2011-07-27 MED ORDER — METOCLOPRAMIDE HCL 10 MG PO TABS: 5.0000 mg | ORAL_TABLET | Freq: Three times a day (TID) | ORAL | Status: DC | PRN

## 2011-07-27 MED ORDER — PREDNISONE 5 MG PO TABS: 5.0000 mg | ORAL_TABLET | Freq: Two times a day (BID) | ORAL | Status: DC

## 2011-07-27 MED ORDER — PREDNISONE 20 MG PO TABS
20.0000 mg | ORAL_TABLET | Freq: Every day | ORAL | Status: DC
  Filled 2011-07-27: qty 1

## 2011-07-27 MED ORDER — POLYETHYLENE GLYCOL 3350 17 G PO PACK: 17.0000 g | PACK | Freq: Every day | ORAL | Status: DC | PRN

## 2011-07-27 MED ORDER — ONDANSETRON HCL 4 MG PO TABS: 4.0000 mg | ORAL_TABLET | Freq: Four times a day (QID) | ORAL | Status: DC | PRN

## 2011-07-27 MED ORDER — ACETAMINOPHEN 10 MG/ML IV SOLN
1000.0000 mg | Freq: Once | INTRAVENOUS | Status: AC
  Administered 2011-07-27: 1000 mg via INTRAVENOUS

## 2011-07-27 MED ORDER — ALBUTEROL SULFATE (5 MG/ML) 0.5% IN NEBU
INHALATION_SOLUTION | RESPIRATORY_TRACT | Status: AC
Start: 2011-07-27 — End: 2011-07-27
  Filled 2011-07-27: qty 0.5

## 2011-07-27 MED ORDER — TRAMADOL HCL 50 MG PO TABS
50.0000 mg | ORAL_TABLET | Freq: Four times a day (QID) | ORAL | Status: DC | PRN
  Administered 2011-07-29: 100 mg via ORAL
  Filled 2011-07-27 (×2): qty 2

## 2011-07-27 MED ORDER — RIVAROXABAN 10 MG PO TABS
10.0000 mg | ORAL_TABLET | Freq: Every day | ORAL | Status: DC
  Administered 2011-07-28 – 2011-07-29 (×2): 10 mg via ORAL
  Filled 2011-07-27 (×2): qty 1

## 2011-07-27 MED ORDER — METOCLOPRAMIDE HCL 5 MG/ML IJ SOLN: 5.0000 mg | Freq: Three times a day (TID) | INTRAMUSCULAR | Status: DC | PRN

## 2011-07-27 MED ORDER — SODIUM CHLORIDE 0.9 % IV SOLN: INTRAVENOUS | Status: DC

## 2011-07-27 MED ORDER — TEMAZEPAM 15 MG PO CAPS: 15.0000 mg | ORAL_CAPSULE | Freq: Every evening | ORAL | Status: DC | PRN

## 2011-07-27 MED ORDER — AMLODIPINE BESYLATE 2.5 MG PO TABS
2.5000 mg | ORAL_TABLET | Freq: Every day | ORAL | Status: DC
  Administered 2011-07-28 – 2011-07-29 (×2): 2.5 mg via ORAL
  Filled 2011-07-27 (×2): qty 1

## 2011-07-27 MED ORDER — ALBUTEROL SULFATE (5 MG/ML) 0.5% IN NEBU
2.5000 mg | INHALATION_SOLUTION | Freq: Once | RESPIRATORY_TRACT | Status: AC
  Administered 2011-07-27: 2.5 mg via RESPIRATORY_TRACT

## 2011-07-27 MED ORDER — PROMETHAZINE HCL 25 MG/ML IJ SOLN: 6.2500 mg | INTRAMUSCULAR | Status: DC | PRN

## 2011-07-27 MED ORDER — MENTHOL 3 MG MT LOZG: 1.0000 | LOZENGE | OROMUCOSAL | Status: DC | PRN

## 2011-07-27 MED ORDER — BUPIVACAINE LIPOSOME 1.3 % IJ SUSP
20.0000 mL | Freq: Once | INTRAMUSCULAR | Status: DC
  Filled 2011-07-27: qty 20

## 2011-07-27 MED ORDER — ACETAMINOPHEN 325 MG PO TABS: 650.0000 mg | ORAL_TABLET | Freq: Four times a day (QID) | ORAL | Status: DC | PRN

## 2011-07-27 MED ORDER — LEVOTHYROXINE SODIUM 75 MCG PO TABS
75.0000 ug | ORAL_TABLET | Freq: Every day | ORAL | Status: DC
  Administered 2011-07-28 – 2011-07-29 (×2): 75 ug via ORAL
  Filled 2011-07-27 (×3): qty 1

## 2011-07-27 MED ORDER — MEPERIDINE HCL 50 MG/ML IJ SOLN: 6.2500 mg | INTRAMUSCULAR | Status: DC | PRN

## 2011-07-27 MED ORDER — PHENOL 1.4 % MT LIQD: 1.0000 | OROMUCOSAL | Status: DC | PRN

## 2011-07-27 MED ORDER — CEFAZOLIN SODIUM 1-5 GM-% IV SOLN
1.0000 g | Freq: Four times a day (QID) | INTRAVENOUS | Status: AC
  Administered 2011-07-28 (×3): 1 g via INTRAVENOUS
  Filled 2011-07-27 (×3): qty 50

## 2011-07-27 MED ORDER — FLEET ENEMA 7-19 GM/118ML RE ENEM: 1.0000 | ENEMA | Freq: Once | RECTAL | Status: AC | PRN

## 2011-07-27 MED ORDER — HYDROMORPHONE HCL PF 1 MG/ML IJ SOLN
0.2500 mg | INTRAMUSCULAR | Status: DC | PRN
  Administered 2011-07-27: 0.25 mg via INTRAVENOUS

## 2011-07-27 MED ORDER — BISACODYL 10 MG RE SUPP: 10.0000 mg | Freq: Every day | RECTAL | Status: DC | PRN

## 2011-07-27 MED ORDER — ACETAMINOPHEN 10 MG/ML IV SOLN
1000.0000 mg | Freq: Four times a day (QID) | INTRAVENOUS | Status: AC
  Administered 2011-07-27 – 2011-07-28 (×4): 1000 mg via INTRAVENOUS
  Filled 2011-07-27 (×4): qty 100

## 2011-07-27 MED ORDER — PREDNISONE 20 MG PO TABS
20.0000 mg | ORAL_TABLET | Freq: Every day | ORAL | Status: AC
  Filled 2011-07-27: qty 1

## 2011-07-27 MED ORDER — POLYETHYL GLYCOL-PROPYL GLYCOL 0.4-0.3 % OP SOLN: 1.0000 [drp] | Freq: Two times a day (BID) | OPHTHALMIC | Status: DC | PRN

## 2011-07-27 MED ORDER — PREDNISONE 5 MG PO TABS: 5.0000 mg | ORAL_TABLET | Freq: Every day | ORAL | Status: DC

## 2011-07-27 MED ORDER — TRIMETHOPRIM 100 MG PO TABS
50.0000 mg | ORAL_TABLET | ORAL | Status: DC
  Administered 2011-07-29: 50 mg via ORAL
  Filled 2011-07-27 (×2): qty 1

## 2011-07-27 MED ORDER — POLYVINYL ALCOHOL 1.4 % OP SOLN
1.0000 [drp] | Freq: Two times a day (BID) | OPHTHALMIC | Status: DC | PRN
  Filled 2011-07-27: qty 15

## 2011-07-27 MED ORDER — SODIUM CHLORIDE 0.9 % IJ SOLN
INTRAMUSCULAR | Status: DC | PRN
  Administered 2011-07-27: 50 mL via INTRAVENOUS

## 2011-07-27 MED ORDER — ONDANSETRON HCL 4 MG/2ML IJ SOLN
INTRAMUSCULAR | Status: DC | PRN
  Administered 2011-07-27: 4 mg via INTRAVENOUS

## 2011-07-27 MED ORDER — PREDNISONE 20 MG PO TABS
20.0000 mg | ORAL_TABLET | Freq: Two times a day (BID) | ORAL | Status: AC
  Administered 2011-07-28 (×2): 20 mg via ORAL
  Filled 2011-07-27 (×2): qty 1

## 2011-07-27 MED ORDER — MORPHINE SULFATE 2 MG/ML IJ SOLN
1.0000 mg | INTRAMUSCULAR | Status: DC | PRN
  Administered 2011-07-27: 1 mg via INTRAVENOUS
  Administered 2011-07-28: 2 mg via INTRAVENOUS
  Filled 2011-07-27: qty 1

## 2011-07-27 MED ORDER — PREDNISONE 10 MG PO TABS
10.0000 mg | ORAL_TABLET | Freq: Two times a day (BID) | ORAL | Status: DC
  Administered 2011-07-29: 10 mg via ORAL
  Filled 2011-07-27 (×2): qty 1

## 2011-07-27 MED ORDER — LACTATED RINGERS IV SOLN
INTRAVENOUS | Status: DC
  Administered 2011-07-27: 22:00:00 via INTRAVENOUS

## 2011-07-27 SURGICAL SUPPLY — 52 items
BAG SPEC THK2 15X12 ZIP CLS (MISCELLANEOUS) ×1
BAG ZIPLOCK 12X15 (MISCELLANEOUS) ×2 IMPLANT
BIT DRILL 2.8X128 (BIT) ×2 IMPLANT
BLADE EXTENDED COATED 6.5IN (ELECTRODE) ×2 IMPLANT
BLADE SAW SAG 73X25 THK (BLADE) ×1
BLADE SAW SGTL 73X25 THK (BLADE) ×1 IMPLANT
CLOTH BEACON ORANGE TIMEOUT ST (SAFETY) ×2 IMPLANT
CLSR STERI-STRIP ANTIMIC 1/2X4 (GAUZE/BANDAGES/DRESSINGS) ×1 IMPLANT
DECANTER SPIKE VIAL GLASS SM (MISCELLANEOUS) ×2 IMPLANT
DRAPE INCISE IOBAN 66X45 STRL (DRAPES) ×2 IMPLANT
DRAPE ORTHO SPLIT 77X108 STRL (DRAPES) ×4
DRAPE POUCH INSTRU U-SHP 10X18 (DRAPES) ×2 IMPLANT
DRAPE SURG ORHT 6 SPLT 77X108 (DRAPES) ×2 IMPLANT
DRAPE U-SHAPE 47X51 STRL (DRAPES) ×2 IMPLANT
DRSG ADAPTIC 3X8 NADH LF (GAUZE/BANDAGES/DRESSINGS) ×2 IMPLANT
DRSG MEPILEX BORDER 4X4 (GAUZE/BANDAGES/DRESSINGS) ×2 IMPLANT
DRSG MEPILEX BORDER 4X8 (GAUZE/BANDAGES/DRESSINGS) ×2 IMPLANT
DURAPREP 26ML APPLICATOR (WOUND CARE) ×2 IMPLANT
ELECT REM PT RETURN 9FT ADLT (ELECTROSURGICAL) ×2
ELECTRODE REM PT RTRN 9FT ADLT (ELECTROSURGICAL) ×1 IMPLANT
EVACUATOR 1/8 PVC DRAIN (DRAIN) ×2 IMPLANT
FACESHIELD LNG OPTICON STERILE (SAFETY) ×8 IMPLANT
GLOVE BIO SURGEON STRL SZ7.5 (GLOVE) ×2 IMPLANT
GLOVE BIO SURGEON STRL SZ8 (GLOVE) ×2 IMPLANT
GLOVE BIOGEL PI IND STRL 8 (GLOVE) ×2 IMPLANT
GLOVE BIOGEL PI INDICATOR 8 (GLOVE) ×2
GOWN STRL NON-REIN LRG LVL3 (GOWN DISPOSABLE) ×2 IMPLANT
GOWN STRL REIN XL XLG (GOWN DISPOSABLE) ×2 IMPLANT
IMMOBILIZER KNEE 20 (SOFTGOODS)
IMMOBILIZER KNEE 20 THIGH 36 (SOFTGOODS) IMPLANT
KIT BASIN OR (CUSTOM PROCEDURE TRAY) ×2 IMPLANT
MANIFOLD NEPTUNE II (INSTRUMENTS) ×2 IMPLANT
NDL SAFETY ECLIPSE 18X1.5 (NEEDLE) ×1 IMPLANT
NEEDLE HYPO 18GX1.5 SHARP (NEEDLE) ×2
NS IRRIG 1000ML POUR BTL (IV SOLUTION) ×2 IMPLANT
PACK TOTAL JOINT (CUSTOM PROCEDURE TRAY) ×2 IMPLANT
PASSER SUT SWANSON 36MM LOOP (INSTRUMENTS) ×2 IMPLANT
POSITIONER SURGICAL ARM (MISCELLANEOUS) ×2 IMPLANT
SPONGE GAUZE 4X4 12PLY (GAUZE/BANDAGES/DRESSINGS) ×2 IMPLANT
STRIP CLOSURE SKIN 1/2X4 (GAUZE/BANDAGES/DRESSINGS) ×4 IMPLANT
SUT ETHIBOND NAB CT1 #1 30IN (SUTURE) ×4 IMPLANT
SUT MNCRL AB 4-0 PS2 18 (SUTURE) ×2 IMPLANT
SUT VIC AB 1 CT1 27 (SUTURE) ×6
SUT VIC AB 1 CT1 27XBRD ANTBC (SUTURE) ×3 IMPLANT
SUT VIC AB 2-0 CT1 27 (SUTURE) ×6
SUT VIC AB 2-0 CT1 TAPERPNT 27 (SUTURE) ×3 IMPLANT
SUT VLOC 180 0 24IN GS25 (SUTURE) ×1 IMPLANT
SYR 50ML LL SCALE MARK (SYRINGE) ×2 IMPLANT
TOWEL OR 17X26 10 PK STRL BLUE (TOWEL DISPOSABLE) ×4 IMPLANT
TOWEL OR NON WOVEN STRL DISP B (DISPOSABLE) ×2 IMPLANT
TRAY FOLEY CATH 14FRSI W/METER (CATHETERS) ×2 IMPLANT
WATER STERILE IRR 1500ML POUR (IV SOLUTION) ×2 IMPLANT

## 2011-07-27 NOTE — Anesthesia Postprocedure Evaluation (Signed)
  Anesthesia Post-op Note  Patient: Sierra [name]  Procedure(s) Performed: Procedure(s) (LRB): TOTAL HIP ARTHROPLASTY (Left)  Patient Location: PACU  Anesthesia Type: Spinal  Level of Consciousness: awake and alert   Airway and Oxygen Therapy: Patient Spontanous Breathing  Post-op Pain: mild  Post-op Assessment: Post-op Vital signs reviewed, Patient's Cardiovascular Status Stable, Respiratory Function Stable, Patent Airway and No signs of Nausea or vomiting  Post-op Vital Signs: stable  Complications: No apparent anesthesia complications

## 2011-07-27 NOTE — Op Note (Signed)
Pre-operative diagnosis- Osteoarthritis Left hip  Post-operative diagnosis- Osteoarthritis  Left hip  Procedure-  LeftTotal Hip Arthroplasty  Surgeon- Gus Rankin. Elyce Zollinger, MD  Assistant- Avel Peace, PA-C   Anesthesia  Spinal  EBL- 100   Drain Hemovac   Complication- None  Condition-PACU - hemodynamically stable.   Brief Clinical Note-  Sierra [name] is a 78 y.o. female with end stage arthritis of her left hip with progressively worsening pain and dysfunction. Pain occurs with activity and rest including pain at night. She has tried analgesics, protected weight bearing and rest without benefit. Pain is too severe to attempt physical therapy. Radiographs demonstrate bone on bone arthritis with subchondral cyst formation. She presents now for left THA.  Procedure in detail-   The patient is brought into the operating room and placed on the operating table. After successful administration of Spinal  anesthesia, the patient is placed in the  Right lateral decubitus position with the  Left side up and held in place with the hip positioner. The lower extremity is isolated from the perineum with plastic drapes and time-out is performed by the surgical team. The lower extremity is then prepped and draped in the usual sterile fashion. A short posterolateral incision is made with a ten blade through the subcutaneous tissue to the level of the fascia lata which is incised in line with the skin incision. The sciatic nerve is palpated and protected and the short external rotators and capsule are isolated from the femur. The hip is then dislocated and the center of the femoral head is marked. A trial prosthesis is placed such that the trial head corresponds to the center of the patients' native femoral head. The resection level is marked on the femoral neck and the resection is made with an oscillating saw. The femoral head is removed and femoral retractors placed to gain access to the femoral canal.  The canal finder is passed into the femoral canal and the canal is thoroughly irrigated with sterile saline to remove the fatty contents. Axial reaming is performed to 13.5  mm, proximal reaming to 18D  and the sleeve machined to a small. A 18D small trial sleeve is placed into the proximal femur.      The femur is then retracted anteriorly to gain acetabular exposure. Acetabular retractors are placed and the labrum and osteophytes are removed, Acetabular reaming is performed to 49  mm and a 50  mm Pinnacle acetabular shell is placed in anatomic position with excellent purchase. Additional dome screws were placed. An apex hole eliminator is placed and the permanent 32 mm neutral + 4 Marathon liner is placed into the acetabular shell.      The trial femur is then placed into the femoral canal. The size is 18 x 13  stem with a 36 + 8  neck and a 32 + 3 head with the neck version matching  the patients' native anteversion. The hip is reduced with excellent stability with full extension and full external rotation, 70 degrees flexion with 40 degrees adduction and 90 degrees internal rotation and 90 degrees of flexion with 70 degrees of internal rotation. The operative leg is placed on top of the non-operative leg and the leg lengths are found to be equal. The trials are then removed and the permanent implant of the same size is impacted into the femoral canal. The metal femoral head of the same size as the trial is placed and the hip is reduced with the same stability  parameters. The operative leg is again placed on top of the non-operative leg and the leg lengths are found to be equal.      The wound is then copiously irrigated with saline solution and the capsule and short external rotators are re-attached to the femur through drill holes with Ethibond suture. The fascia lata is closed over a hemovac drain with #1 vicryl suture and the fascia lata, gluteal muscles and subcutaneous tissues are injected with Exparel  20ml diluted with saline 50ml. The subcutaneous tissues are closed with #1 and2-0 vicryl and the subcuticular layer closed with running 4-0 Monocryl. The drain is hooked to suction, incision cleaned and dried, and steri-srips and a bulky sterile dressing applied. The limb is placed into a knee immobilizer and the patient is awakened and transported to recovery in stable condition.      Please note that a surgical assistant was a medical necessity for this procedure in order to perform it in a safe and expeditious manner. The assistant was necessary to provide retraction to the vital neurovascular structures and to retract and position the limb to allow for anatomic placement of the prosthetic components.  Gus Rankin Tilak Oakley, MD    07/27/2011, 5:39 PM

## 2011-07-27 NOTE — Anesthesia Preprocedure Evaluation (Signed)
Anesthesia Evaluation  Patient identified by MRN, date of birth, ID band Patient awake    Reviewed: Allergy & Precautions, H&P , NPO status , Patient's Chart, lab work & pertinent test results  Airway Mallampati: II TM Distance: >3 FB Neck ROM: Full    Dental No notable dental hx.    Pulmonary neg pulmonary ROS, asthma ,  breath sounds clear to auscultation  Pulmonary exam normal       Cardiovascular hypertension, Pt. on medications negative cardio ROS  Rhythm:Regular Rate:Normal     Neuro/Psych negative neurological ROS  negative psych ROS   GI/Hepatic negative GI ROS, Neg liver ROS, GERD-  ,  Endo/Other  negative endocrine ROSHypothyroidism   Renal/GU negative Renal ROS  negative genitourinary   Musculoskeletal negative musculoskeletal ROS (+)   Abdominal   Peds negative pediatric ROS (+)  Hematology negative hematology ROS (+)   Anesthesia Other Findings   Reproductive/Obstetrics negative OB ROS                           Anesthesia Physical Anesthesia Plan  ASA: II  Anesthesia Plan: Spinal   Post-op Pain Management:    Induction: Intravenous  Airway Management Planned:   Additional Equipment:   Intra-op Plan:   Post-operative Plan:   Informed Consent: I have reviewed the patients History and Physical, chart, labs and discussed the procedure including the risks, benefits and alternatives for the proposed anesthesia with the patient or authorized representative who has indicated his/her understanding and acceptance.   Dental advisory given  Plan Discussed with: CRNA  Anesthesia Plan Comments:         Anesthesia Quick Evaluation

## 2011-07-27 NOTE — H&P (View-Only) (Signed)
Dharma H. Viens  DOB: 03/16/1929 Married / Language: English / Race: White / Female  Date of Admission:  07/27/2011  Chief Complaint:  Left hip pain  History of Present Illness The patient is a 78 year old female who comes in for a preoperative History and Physical. The patient is scheduled for a left total hip arthroplasty to be performed by Dr. Frank V. Aluisio, MD at [facility] on 07/27/2011. The patient is a 78 year old female who presents for a recheck of Follow-up Hip. The patient is being followed for their left hip pain and osteoarthritis. Symptoms reported today include: pain. The patient feels that they are doing poorly and report their pain level to be moderate. Current treatment includes: pain medications. The following medication has been used for pain control: Ultram. The patient presents today following ultrasound guidedintra-articular injection with Dr. Kendall . The patient has not gotten any relief of their symptoms with Cortisone injections. Harshitha Mohr states that this hip is starting to feel like the other one did prior to the time it needed to be replaced. She has noted some major increase in pain over the past several months. The patient had the intraarticular injection which helped for only a very short amount of time. The pain is limiting what she can and cannot do. It is keeping her up at night. She is having a hard time walking. She is having a hard time doing activities of daily living. Treatment to date has been analgesics medications, intraarticular injection and activity limitation. She has also started using a cane at times. She is now ready to proceed with surgery. They have been treated conservatively in the past for the above stated problem and despite conservative measures, they continue to have progressive pain and severe functional limitations and dysfunction. They have failed non-operative management including home exercise, medications, and injections.  It is felt that they would benefit from undergoing total joint replacement. Risks and benefits of the procedure have been discussed with the patient and they elect to proceed with surgery. There are no active contraindications to surgery such as ongoing infection or rapidly progressive neurological disease.  Allergies Codeine Phosphate *ANALGESICS - OPIOID*. Nausea. Dilantin *ANTICONVULSANTS* Cipro *FLUOROQUINOLONES*. Rash. Sulfa Drugs. Rash. Statins. Pain and arthralagis  Medication History  Synthroid ( Oral) Specific dose unknown - Active. Allegra (60MG Tablet, 1-2 tabs Oral) Active. Combivent ( Inhalation) Specific dose unknown - Active. Ativan (0.5MG Tablet, Oral as needed) Active. Flovent HFA (110MCG/ACT Aerosol, Inhalation) Active. PredniSONE (5MG Tablet, Oral) Active. Norvasc (2.5MG Tablet, Oral daily) Active. Albuterol Sulfate ( Inhalation) Specific dose unknown - Active. Omeprazole (20MG Capsule DR, Oral) Active. Trimethoprim (100MG Tablet, Oral) Active.  Problem List/Past Medical  Osteoarthritis, Hip (715.35) Hiatal Hernia Hemorrhoids Allergic rhinitis (477.9) Ulcerative Colitis Irritable bowel syndrome Heart murmur Gastroesophageal Reflux Disease Chronic Cystitis Hypothyroidism Hypercholesterolemia High blood pressure Asthma Chronic Obstructive Lung Disease Bronchitis Pneumonia Diverticulosis Measles Mumps Menopause History of Brain Tumor  Past Surgical History Colon Removal - Partial Hysterectomy (not due to cancer) - Complete Cataract Surgery. bilateral; Right Eye 01/11/2002, Left Eye 08/03/2002 Appendectomy Other Orthopaedic Surgery. right wrist cyst removed Total Hip Replacement - Right Thyroidectomy; Subtotal. Date: 1986. right Hysterectomy. Date: 1985. complete (non-cancerous) Colon Polyp Removal - Colonoscopy Total Hip Replacement. right Tonsillectomy. Date: 1938. Dilation and Curettage of Uterus. Date: 1967. Brain Surgery. Date:  09/09/2000. Tumor Removal  Family History Hypertension. mother and father mother, father, sister and grandfather mothers side Heart disease in female family member before   age 55 Heart Disease. father Osteoporosis. mother Osteoarthritis. grandmother mothers side sister  Social History Alcohol use. current drinker; drinks wine; only occasionally per week current drinker; only occasionally per week Current work status. Retired. retired Copy of Drug/Alcohol Rehab (Previously). no Children. 2 Tobacco use. Never smoker. never smoker Tobacco / smoke exposure. no Exercise. does other Exercises daily; does other Number of flights of stairs before winded. less than 1 Marital status. married Living situation. live with spouse No alcohol use Post-Surgical Plans. Plan is to go home after the hospital stay. Advance Directives. Living Will, Healthcare POA  Review of Systems General:Present- Fatigue. Not Present- Chills, Fever, Night Sweats, Weight Gain, Weight Loss and Memory Loss. Skin:Not Present- Hives, Itching, Rash, Eczema and Lesions. HEENT:Not Present- Tinnitus, Headache, Double Vision, Visual Loss, Hearing Loss and Dentures. Respiratory:Present- Wheezing. Not Present- Shortness of breath with exertion, Shortness of breath at rest, Allergies, Coughing up blood and Chronic Cough. Cardiovascular:Not Present- Chest Pain, Racing/skipping heartbeats, Difficulty Breathing Lying Down, Murmur, Swelling and Palpitations. Gastrointestinal:Present- Heartburn. Not Present- Bloody Stool, Abdominal Pain, Vomiting, Nausea, Constipation, Diarrhea, Difficulty Swallowing, Jaundice and Loss of appetitie. Female Genitourinary:Not Present- Blood in Urine, Urinary frequency, Weak urinary stream, Discharge, Flank Pain, Incontinence, Painful Urination, Urgency, Urinary Retention and Urinating at Night. Musculoskeletal:Not Present- Muscle Weakness, Muscle Pain, Joint Swelling, Joint Pain,  Back Pain, Morning Stiffness and Spasms. Neurological:Not Present- Tremor, Dizziness, Blackout spells, Paralysis, Difficulty with balance and Weakness. Psychiatric:Not Present- Insomnia.  Vitals Weight: 122 lb Height: 63 in Body Surface Area: 1.57 m Body Mass Index: 21.61 kg/m Pulse: 92 (Regular) Resp.: 16 (Unlabored) BP: 142/68 (Sitting, Right Arm, Standard)  Physical Exam The physical exam findings are as follows: Patient is an 78 year old female with continued hip pain. Patient is accompanied today by her husband.   General Mental Status - Alert, cooperative and good historian. General Appearance- pleasant. Not in acute distress. Orientation- Oriented X3. Build & Nutrition- Well nourished and Well developed.   Head and Neck Head- normocephalic, atraumatic . Neck Global Assessment- supple. no bruit auscultated on the right and no bruit auscultated on the left.   Eye Pupil- Bilateral- Regular and Round. Note: wears glasses Motion- Bilateral- EOMI.   Chest and Lung Exam Auscultation: Breath sounds:- clear at anterior chest wall and - clear at posterior chest wall. Adventitious sounds:- No Adventitious sounds.   Cardiovascular Auscultation:Rhythm- Regular rate and rhythm. Heart Sounds- S1 WNL and S2 WNL. Murmurs & Other Heart Sounds: Murmur 1:Location- Aortic Area. Timing- Holosystolic. Grade- II/VI. Character- Holosystolic and Low pitched.   Abdomen Palpation/Percussion:Tenderness- Abdomen is non-tender to palpation. Rigidity (guarding)- Abdomen is soft. Auscultation:Auscultation of the abdomen reveals - Bowel sounds normal.   Female Genitourinary Not done, not pertinent to present illness  Musculoskeletal On examination, she is a well-developed female alert and oriented in no apparent distress. She has a significantly antalgic gait pattern on the left. She can be flexed to 90 degrees, no internal rotation, about  20 external rotation and 20 abduction. The right hip shows flexion to 110 degrees, rotation in 40, out 50, abduction 50 without discomfort.  RADIOGRAPHS: Radiographs from a couple of months ago AP pelvis and AP and lateral left hip show there is significant joint space narrowing just about bone-on-bone. There is some subchondral sclerosis in the femoral head. There is some osteophyte formation also at the margin of the joint.  Assessment & Plan Osteoarthritis Left Hip  Patient is for a Left Total Hip Replacement by Dr. Aluisio.  Plan is   to go home after the hospital stay.  PCP - Dr. Aronson - Patient has been seen by Dr. Aronson preoperatively and felt to be stable for surgery. Pulm - Dr. Young Cards - Dr. Brackbill GI - Dr. Edwards  Drew Ame Heagle, PA-C  

## 2011-07-27 NOTE — Transfer of Care (Signed)
Immediate Anesthesia Transfer of Care Note  Patient: Sierra [name]  Procedure(s) Performed: Procedure(s) (LRB): TOTAL HIP ARTHROPLASTY (Left)  Patient Location: PACU  Anesthesia Type: Regional  Level of Consciousness: awake, alert  and oriented  Airway & Oxygen Therapy: Patient Spontanous Breathing and Patient connected to face mask oxygen  Post-op Assessment: Report given to PACU RN and Post -op Vital signs reviewed and stable  Post vital signs: Reviewed and stable  Complications: No apparent anesthesia complications SAB level T 12

## 2011-07-27 NOTE — Interval H&P Note (Signed)
History and Physical Interval Note:  07/27/2011 3:43 PM  Sierra [name]  has presented today for surgery, with the diagnosis of Osteoarthritis of Left Hip  The various methods of treatment have been discussed with the patient and family. After consideration of risks, benefits and other options for treatment, the patient has consented to  Procedure(s) (LRB): TOTAL HIP ARTHROPLASTY (Left) as a surgical intervention .  The patients' history has been reviewed, patient examined, no change in status, stable for surgery.  I have reviewed the patients' chart and labs.  Questions were answered to the patient's satisfaction.     Loanne Drilling

## 2011-07-28 DIAGNOSIS — E871 Hypo-osmolality and hyponatremia: Secondary | ICD-10-CM | POA: Diagnosis not present

## 2011-07-28 HISTORY — DX: Hypo-osmolality and hyponatremia: E87.1

## 2011-07-28 LAB — CBC
HCT: 33.4 % — ABNORMAL LOW (ref 36.0–46.0)
Hemoglobin: 11 g/dL — ABNORMAL LOW (ref 12.0–15.0)
MCH: 27.6 pg (ref 26.0–34.0)
MCHC: 32.9 g/dL (ref 30.0–36.0)
MCV: 83.7 fL (ref 78.0–100.0)
Platelets: 290 10*3/uL (ref 150–400)
RBC: 3.99 MIL/uL (ref 3.87–5.11)
RDW: 15.6 % — ABNORMAL HIGH (ref 11.5–15.5)
WBC: 20 10*3/uL — ABNORMAL HIGH (ref 4.0–10.5)

## 2011-07-28 LAB — BASIC METABOLIC PANEL
Calcium: 8.8 mg/dL (ref 8.4–10.5)
GFR calc Af Amer: >90 mL/min (ref >90)
GFR calc non Af Amer: 84 mL/min — ABNORMAL LOW (ref >90)
Potassium: 3.9 mEq/L (ref 3.5–5.1)
Sodium: 133 mEq/L — ABNORMAL LOW (ref 135–145)

## 2011-07-28 NOTE — Evaluation (Addendum)
Occupational Therapy Evaluation Patient Details Name: Sierra [name] MRN: [id number] DOB: [birth year] Today's Date: 07/28/2011  Problem List:  Patient Active Problem List  Diagnoses  . THRUSH  . HYPERLIPIDEMIA  . ANEMIA, IRON DEFICIENCY, MICROCYTIC  . LEUKOCYTOSIS  . THROMBOCYTOSIS  . HYPERTENSION  . Acute bronchitis  . PHARYNGITIS, CHRONIC  . ALLERGIC RHINITIS  . BRONCHITIS, CHRONIC  . ESOPHAGEAL REFLUX  . ULCERATIVE COLITIS  . DIVERTICULITIS, COLON  . ARTHRITIS  . HOARSENESS, CHRONIC  . CARDIAC MURMUR  . ATYPICAL MYCOBACTERIAL INFECTION  . HYPOTHYROIDISM  . DYSPNEA ON EXERTION  . PNEUMONIA  . Osteoarthritis of hip  . Postop Hyponatremia    Past Medical History:  Past Medical History  Diagnosis Date  . Ulcerative colitis   . Hypertension   . Hyperlipemia   . Diverticulitis, colon   . Other specified iron deficiency anemias   . Thrombocytosis   . Leukocytosis   . Esophageal reflux   . Chronic pharyngitis   . Allergic rhinitis   . Acute asthmatic bronchitis   . Hypothyroidism   . Cardiac murmur     DOES NOT CAUSE ANY SYMPTOMS  . Recurrent upper respiratory infection (URI)     ON 07/10/11 PT SAW DR. Joni Fears YOUNG FOR TX OF ACUTE BRONCHITIS --GIVEN  EXTRA PREDNISONE ( IN ADDITION TO THE DAILY PREDNISONE PT TAKES)  AND PT FINISHED A Z-PAK--STILL HAS A COUGH TODAY 07/15/11-NO FEVER.  Marland Kitchen Blood transfusion   . Arthritis     HX OF RT SHOULDER BURSITIS-AND THE SHOULDER IS HURTING AT PRESENT, PAIN IN LEFT KNEE AT PRESENT  AND PAIN IN LEFT HIP  . IBS (irritable bowel syndrome)   . Balance problem     SINCE BRAIN TUMOR REMOVED IN 2002-BENIGN TUMOR-PT HAS ADRENAL INSUFFICIENCY AND TAKES DAILY PREDNISONE   Past Surgical History:  Past Surgical History  Procedure Date  . Partial colectomy 2008  . Total hip arthroplasty OCT 2006    right  . Vesicovaginal fistula closure w/ tah   . Thyroidectomy 1986  . Appendectomy   . Craniotomy   . Subglottal mucocoel 2000  .  Meningioma resected 20O2  . Frontalis suspension 10-09-2010    lifting eyelids AND SECOND EYE SURGERY November 19, 2010  . Tonsillectomy 1938  . Abdominal hysterectomy 1985  . Dilation and curettage of uterus 1967  . Eye surgery     2003 -RIGHT CATARACT EXTRACTED AND LEFT WAS DONE IN 2004  . Wrist tendon lesion removed 2007     OT Assessment/Plan/Recommendation OT Assessment Clinical Impression Statement: This 78 year old female is s/p L THA with posterior THPs and PWB.  She is min A to transfer and overall min A with LB ADLs. Pt verbalizes precautions and use of AE/DME.  Will follow in acute setting to reinforce precaution education for safe discharge home. OT Recommendation/Assessment: Patient will need skilled OT in the acute care venue OT Problem List: Decreased strength;Decreased activity tolerance;Pain;Impaired balance (sitting and/or standing);Decreased knowledge of precautions OT Therapy Diagnosis : Generalized weakness OT Plan OT Frequency: 2X/week OT Treatment/Interventions: Self-care/ADL training;DME and/or AE instruction;Patient/family education;Balance training OT Recommendation Follow Up Recommendations: No OT follow up Equipment Recommended: None recommended by OT Individuals Consulted Consulted and Agree with Results and Recommendations: Patient OT Goals Acute Rehab OT Goals OT Goal Formulation: With patient Time For Goal Achievement: 7 days ADL Goals Pt Will Transfer to Toilet: Ambulation;with min assist;Maintaining weight bearing status;Maintaining hip precautions (min guard) ADL Goal: Toilet Transfer - Progress: Goal set today  Pt Will Perform Tub/Shower Transfer: Shower transfer;Ambulation;Maintaining hip precautions;Maintaining weight bearing status;with min assist;Shower seat with back (min guard assist) ADL Goal: Tub/Shower Transfer - Progress: Goal set today Miscellaneous OT Goals Miscellaneous OT Goal #1: Pt will verbalize vs. demonstrate use of AE with THPs OT  Goal: Miscellaneous Goal #1 - Progress: Goal set today Miscellaneous OT Goal #2: Pt will complete LB ADLs including toileting hygiene and clothes with min guard A using AE OT Goal: Miscellaneous Goal #2 - Progress: Goal set today  OT Evaluation Precautions/Restrictions  Precautions Precautions: Posterior Hip Restrictions Weight Bearing Restrictions: Yes LLE Weight Bearing: Partial weight bearing Prior Functioning Home Living Lives With: Spouse Bathroom Shower/Tub: Walk-in Soil scientist Toilet: Handicapped height Home Adaptive Equipment: Shower chair with back;Bedside commode/3-in-1 Prior Function Level of Independence: Independent with basic ADLs ADL ADL Eating/Feeding: Simulated;Independent Where Assessed - Eating/Feeding: Chair Grooming: Simulated;Set up Where Assessed - Grooming: Sitting, chair;Supported Upper Body Bathing: Simulated;Supervision/safety Where Assessed - Upper Body Bathing: Sitting, chair;Supported Lower Body Bathing: Simulated;Minimal assistance;Other (comment) (with reacher) Lower Body Bathing Details (indicate cue type and reason): loses balance when she first stands:  h/o benign brain tumor Where Assessed - Lower Body Bathing: Sit to stand from chair Upper Body Dressing: Simulated;Supervision/safety Where Assessed - Upper Body Dressing: Sitting, chair;Supported Lower Body Dressing: Simulated;Moderate assistance;Other (comment) (did not want to practice today; has AE.) Where Assessed - Lower Body Dressing: Sit to stand from chair Toilet Transfer: Performed;Minimal assistance Toilet Transfer Details (indicate cue type and reason): min cues for sequence; step length Toilet Transfer Method: Ambulating Toilet Transfer Equipment: Comfort height toilet;Raised toilet seat with arms (or 3-in-1 over toilet);Grab bars (2 transfers) Toileting - Clothing Manipulation: Simulated;Minimal assistance (min for balance) Where Assessed - Toileting Clothing Manipulation:  Standing Toileting - Hygiene: Simulated;Minimal assistance (for balance) Where Assessed - Toileting Hygiene: Standing Tub/Shower Transfer:  (educated on sequence; pt did not wish to practice) Tub/Shower Transfer Method: Ambulating Equipment Used: Agricultural consultant;Other (comment) (pt verbalizes use of reacher) Ambulation Related to ADLs: loses balance posteriorly initially:  not a new problem ADL Comments: she had R THA 6 years ago; husband had one done 2 years ago Vision/Perception  Vision - History Baseline Vision: Wears glasses all the time Cognition Cognition Overall Cognitive Status: Appears within functional limits for tasks assessed Orientation Level: Oriented X4 Sensation/Coordination   Extremity Assessment RUE Assessment RUE Assessment: Within Functional Limits for adls--h/o bursitis LUE Assessment LUE Assessment: Within Functional Limits Mobility  Transfers Transfers: Yes Sit to Stand: 4: Min assist Exercises   End of Session OT - End of Session Activity Tolerance: Patient tolerated treatment well Patient left: in chair;with call bell in reach General Behavior During Session: [facility] for tasks performed Cognition: [facility] for tasks performed [facility], OTR/L [phone] 07/28/2011  Jonanthan Bolender 07/28/2011, 1:29 PM

## 2011-07-28 NOTE — Evaluation (Signed)
Physical Therapy Evaluation Patient Details Name: Sierra [name] MRN: [id number] DOB: 1928/08/29 Today's Date: 07/28/2011  Problem List:  Patient Active Problem List  Diagnoses  . THRUSH  . HYPERLIPIDEMIA  . ANEMIA, IRON DEFICIENCY, MICROCYTIC  . LEUKOCYTOSIS  . THROMBOCYTOSIS  . HYPERTENSION  . Acute bronchitis  . PHARYNGITIS, CHRONIC  . ALLERGIC RHINITIS  . BRONCHITIS, CHRONIC  . ESOPHAGEAL REFLUX  . ULCERATIVE COLITIS  . DIVERTICULITIS, COLON  . ARTHRITIS  . HOARSENESS, CHRONIC  . CARDIAC MURMUR  . ATYPICAL MYCOBACTERIAL INFECTION  . HYPOTHYROIDISM  . DYSPNEA ON EXERTION  . PNEUMONIA  . Osteoarthritis of hip  . Postop Hyponatremia    Past Medical History:  Past Medical History  Diagnosis Date  . Ulcerative colitis   . Hypertension   . Hyperlipemia   . Diverticulitis, colon   . Other specified iron deficiency anemias   . Thrombocytosis   . Leukocytosis   . Esophageal reflux   . Chronic pharyngitis   . Allergic rhinitis   . Acute asthmatic bronchitis   . Hypothyroidism   . Cardiac murmur     DOES NOT CAUSE ANY SYMPTOMS  . Recurrent upper respiratory infection (URI)     ON 07/10/11 PT SAW DR. Joni Fears YOUNG FOR TX OF ACUTE BRONCHITIS --GIVEN  EXTRA PREDNISONE ( IN ADDITION TO THE DAILY PREDNISONE PT TAKES)  AND PT FINISHED A Z-PAK--STILL HAS A COUGH TODAY 07/15/11-NO FEVER.  Marland Kitchen Blood transfusion   . Arthritis     HX OF RT SHOULDER BURSITIS-AND THE SHOULDER IS HURTING AT PRESENT, PAIN IN LEFT KNEE AT PRESENT  AND PAIN IN LEFT HIP  . IBS (irritable bowel syndrome)   . Balance problem     SINCE BRAIN TUMOR REMOVED IN 2002-BENIGN TUMOR-PT HAS ADRENAL INSUFFICIENCY AND TAKES DAILY PREDNISONE   Past Surgical History:  Past Surgical History  Procedure Date  . Partial colectomy 2008  . Total hip arthroplasty OCT 2006    right  . Vesicovaginal fistula closure w/ tah   . Thyroidectomy 1986  . Appendectomy   . Craniotomy   . Subglottal mucocoel 2000  . Meningioma  resected 20O2  . Frontalis suspension 10-09-2010    lifting eyelids AND SECOND EYE SURGERY November 19, 2010  . Tonsillectomy 1938  . Abdominal hysterectomy 1985  . Dilation and curettage of uterus 1967  . Eye surgery     2003 -RIGHT CATARACT EXTRACTED AND LEFT WAS DONE IN 2004  . Wrist tendon lesion removed 2007     PT Assessment/Plan/Recommendation PT Assessment Clinical Impression Statement: pt tolerated very well, pain meds are due, RN notified.  Pt will benefit from PT to improve ROM, strength safety for DC home. PT Recommendation/Assessment: Patient will need skilled PT in the acute care venue PT Problem List: Decreased strength;Decreased range of motion;Decreased activity tolerance;Decreased knowledge of use of DME;Decreased mobility;Decreased safety awareness;Pain PT Therapy Diagnosis : Difficulty walking;Acute pain PT Plan PT Frequency: 7X/week PT Recommendation Recommendations for Other Services: OT consult Follow Up Recommendations: Home health PT Equipment Recommended: None recommended by OT PT Goals  Acute Rehab PT Goals PT Goal Formulation: With patient Time For Goal Achievement: 7 days Pt will go Supine/Side to Sit: with supervision PT Goal: Supine/Side to Sit - Progress: Goal set today Pt will go Sit to Supine/Side: with supervision PT Goal: Sit to Supine/Side - Progress: Goal set today Pt will go Sit to Stand: with supervision PT Goal: Sit to Stand - Progress: Goal set today Pt will go Stand to  Sit: with supervision PT Goal: Stand to Sit - Progress: Goal set today Pt will Ambulate: >150 feet;with supervision PT Goal: Ambulate - Progress: Goal set today Pt will Go Up / Down Stairs: 1-2 stairs;with supervision;with rolling walker PT Goal: Up/Down Stairs - Progress: Goal set today Pt will Perform Home Exercise Program: with supervision, verbal cues required/provided PT Goal: Perform Home Exercise Program - Progress: Goal set today Additional Goals Additional Goal  #1: demo 3/3 posterior Hip precautions  PT Evaluation Precautions/Restrictions  Precautions Precautions: Posterior Hip Restrictions Weight Bearing Restrictions: Yes LLE Weight Bearing: Partial weight bearing Prior Functioning  Home Living Lives With: Spouse Type of Home: House Home Layout: One level Home Access: Stairs to enter Entrance Stairs-Rails: None Entrance Stairs-Number of Steps: 1] Bathroom Shower/Tub: Health visitor: Handicapped height Home Adaptive Equipment: Shower chair with back;Bedside commode/3-in-1 Prior Function Level of Independence: Independent with basic ADLs Able to Take Stairs?: Yes Cognition Cognition Arousal/Alertness: Awake/alert Overall Cognitive Status: Appears within functional limits for tasks assessed Orientation Level: Oriented X4 Sensation/Coordination   Extremity Assessment RUE Assessment RUE Assessment: Within Functional Limits LUE Assessment LUE Assessment: Within Functional Limits RLE Assessment RLE Assessment: Within Functional Limits LLE Assessment LLE Assessment: Exceptions to WFL LLE AROM (degrees) LLE Overall AROM Comments: pt is able to flex hip with min assist. Mobility (including Balance) Bed Mobility Bed Mobility: Yes Supine to Sit: With rails;3: Mod assist Supine to Sit Details (indicate cue type and reason): vc to try to keep L hip from IR as pt moved to eob Transfers Transfers: Yes Sit to Stand: 4: Min assist Sit to Stand Details (indicate cue type and reason): vc for technique Stand to Sit: 4: Min assist;With armrests;To chair/3-in-1 Stand to Sit Details: vc for hip precautions Ambulation/Gait Ambulation/Gait: Yes Ambulation/Gait Assistance: 4: Min assist Ambulation/Gait Assistance Details (indicate cue type and reason): vc for keeping LLE more in ER., gait sequence Ambulation Distance (Feet): 120 Feet Assistive device: Rolling walker Gait Pattern: Step-to pattern  Posture/Postural  Control Posture/Postural Control: No significant limitations Exercise    End of Session PT - End of Session Activity Tolerance: Patient tolerated treatment well Patient left: in chair;with call bell in reach;with family/visitor present Nurse Communication: Mobility status for transfers General Behavior During Session: [facility] for tasks performed Cognition: [facility] for tasks performed  Rada Hay 07/28/2011, 3:07 PM  [phone]

## 2011-07-28 NOTE — Progress Notes (Signed)
Subjective: 1 Day Post-Op Procedure(s) (LRB): TOTAL HIP ARTHROPLASTY (Left) Patient reports pain as mild.   Patient seen in rounds with Dr. Lequita Halt. Patient is doing great this morning. We will start therapy today. Plan is to go home after hospital stay.  Objective: Vital signs in last 24 hours: Temp:  [97.1 F (36.2 C)-98.6 F (37 C)] 97.6 F (36.4 C) (03/19 0507) Pulse Rate:  [62-107] 75  (03/19 0507) Resp:  [11-24] 16  (03/19 0507) BP: (106-161)/(48-75) 112/68 mmHg (03/19 0507) SpO2:  [96 %-100 %] 96 % (03/19 0507) Weight:  [57.516 kg (126 lb 12.8 oz)] 57.516 kg (126 lb 12.8 oz) (03/18 2215)  Intake/Output from previous day:  Intake/Output Summary (Last 24 hours) at 07/28/11 0835 Last data filed at 07/28/11 0752  Gross per 24 hour  Intake   3345 ml  Output   2470 ml  Net    875 ml    Intake/Output this shift: Total I/O In: -  Out: 200 [Urine:200]  Labs:  [facility] 07/28/11 0425  HGB 11.0*    Basename 07/28/11 0425  WBC 20.0*  RBC 3.99  HCT 33.4*  PLT 290    Basename 07/28/11 0425  NA 133*  K 3.9  CL 97  CO2 26  BUN 14  CREATININE 0.60  GLUCOSE 175*  CALCIUM 8.8   No results found for this basename: LABPT:2,INR:2 in the last 72 hours  Exam - Neurovascular intact Sensation intact distally Dressing - clean, dry, no drainage Motor function intact - moving foot and toes well on exam.  Hemovac pulled without difficulty.  Past Medical History  Diagnosis Date  . Ulcerative colitis   . Hypertension   . Hyperlipemia   . Diverticulitis, colon   . Other specified iron deficiency anemias   . Thrombocytosis   . Leukocytosis   . Esophageal reflux   . Chronic pharyngitis   . Allergic rhinitis   . Acute asthmatic bronchitis   . Hypothyroidism   . Cardiac murmur     DOES NOT CAUSE ANY SYMPTOMS  . Recurrent upper respiratory infection (URI)     ON 07/10/11 PT SAW DR. Joni Fears YOUNG FOR TX OF ACUTE BRONCHITIS --GIVEN  EXTRA PREDNISONE ( IN ADDITION TO THE  DAILY PREDNISONE PT TAKES)  AND PT FINISHED A Z-PAK--STILL HAS A COUGH TODAY 07/15/11-NO FEVER.  Marland Kitchen Blood transfusion   . Arthritis     HX OF RT SHOULDER BURSITIS-AND THE SHOULDER IS HURTING AT PRESENT, PAIN IN LEFT KNEE AT PRESENT  AND PAIN IN LEFT HIP  . IBS (irritable bowel syndrome)   . Balance problem     SINCE BRAIN TUMOR REMOVED IN 2002-BENIGN TUMOR-PT HAS ADRENAL INSUFFICIENCY AND TAKES DAILY PREDNISONE    Assessment/Plan: 1 Day Post-Op Procedure(s) (LRB): TOTAL HIP ARTHROPLASTY (Left) Principal Problem:  *OA (osteoarthritis) of knee Active Problems:  Postop Hyponatremia   Advance diet Up with therapy Continue foley due to strict I&O and urinary output monitoring Discharge home with home health Prednisone taper  DVT Prophylaxis - Xarelto Protocol Partial-Weight Bearing 25-50% left Leg D/C Knee Immobilizer Hemovac Pulled Begin Therapy Hip Preacutions Keep foley until tomorrow. No vaccines.  PERKINS, ALEXZANDREW 07/28/2011, 8:35 AM

## 2011-07-28 NOTE — Progress Notes (Signed)
Physical Therapy Treatment Patient Details Name: Sierra [name] MRN: [id number] DOB: 22-Jun-1928 Today's Date: 07/28/2011  PT Assessment/Plan  PT - Assessment/Plan Comments on Treatment Session: pt continues to tolerate activity well PT Plan: Discharge plan remains appropriate;Frequency remains appropriate PT Frequency: 7X/week Recommendations for Other Services: OT consult Follow Up Recommendations: Home health PT Equipment Recommended: None recommended by OT PT Goals  Acute Rehab PT Goals PT Goal Formulation: With patient Time For Goal Achievement: 7 days Pt will go Supine/Side to Sit: with supervision PT Goal: Supine/Side to Sit - Progress: Goal set today Pt will go Sit to Supine/Side: with supervision PT Goal: Sit to Supine/Side - Progress: Progressing toward goal Pt will go Sit to Stand: with supervision PT Goal: Sit to Stand - Progress: Progressing toward goal Pt will go Stand to Sit: with supervision PT Goal: Stand to Sit - Progress: Progressing toward goal Pt will Ambulate: >150 feet;with supervision PT Goal: Ambulate - Progress: Progressing toward goal Pt will Go Up / Down Stairs: 1-2 stairs;with supervision;with rolling walker PT Goal: Up/Down Stairs - Progress: Goal set today Pt will Perform Home Exercise Program: with supervision, verbal cues required/provided PT Goal: Perform Home Exercise Program - Progress: Progressing toward goal Additional Goals Additional Goal #1: demo 3/3 posterior Hip precautions PT Goal: Additional Goal #1 - Progress: Progressing toward goal  PT Treatment Precautions/Restrictions  Precautions Precautions: Posterior Hip Restrictions Weight Bearing Restrictions: Yes LLE Weight Bearing: Partial weight bearing Mobility (including Balance)    Posture/Postural Control Posture/Postural Control: No significant limitations Exercise  Total Joint Exercises Ankle Circles/Pumps: AROM;Left;10 reps Quad Sets: AROM;10 reps;Left Short Arc Quad:  AROM;Left;10 reps Heel Slides: AAROM;Left;10 reps Hip ABduction/ADduction: AAROM;Left;10 reps End of Session PT - End of Session Activity Tolerance: Patient tolerated treatment well Patient left: in bed;with call bell in reach Nurse Communication: Mobility status for transfers General Behavior During Session: [facility] for tasks performed Cognition: [facility] for tasks performed  Sierra [name] 07/28/2011, 3:22 PM

## 2011-07-28 NOTE — Progress Notes (Signed)
Physical Therapy Treatment Patient Details Name: Sierra [name] MRN: [id number] DOB: 1929-03-04 Today's Date: 07/28/2011 [id number] PT Assessment/Plan  PT - Assessment/Plan Comments on Treatment Session: pt tolerated better, has been medicated. Pain 3/10.   PT Plan: Discharge plan remains appropriate;Frequency remains appropriate PT Frequency: 7X/week Recommendations for Other Services: OT consult Follow Up Recommendations: Home health PT Equipment Recommended: None recommended by PT PT Goals  Acute Rehab PT Goals PT Goal Formulation: With patient Time For Goal Achievement: 7 days Pt will go Supine/Side to Sit: with supervision PT Goal: Supine/Side to Sit - Progress: Goal set today Pt will go Sit to Supine/Side: with supervision PT Goal: Sit to Supine/Side - Progress: Progressing toward goal Pt will go Sit to Stand: with supervision PT Goal: Sit to Stand - Progress: Progressing toward goal Pt will go Stand to Sit: with supervision PT Goal: Stand to Sit - Progress: Progressing toward goal Pt will Ambulate: >150 feet;with supervision PT Goal: Ambulate - Progress: Progressing toward goal Pt will Go Up / Down Stairs: 1-2 stairs;with supervision;with rolling walker PT Goal: Up/Down Stairs - Progress: Goal set today Pt will Perform Home Exercise Program: with supervision, verbal cues required/provided PT Goal: Perform Home Exercise Program - Progress: Progressing toward goal Additional Goals Additional Goal #1: demo 3/3 posterior Hip precautions  PT Treatment Precautions/Restrictions  Precautions Precautions: Posterior Hip Restrictions Weight Bearing Restrictions: Yes LLE Weight Bearing: Partial weight bearing Mobility (including Balance) Bed Mobility Bed Mobility: Yes Supine to Sit: With rails;3: Mod assist Supine to Sit Details (indicate cue type and reason): vc to try to keep L hip from IR as pt moved to eob Sit to Supine: 4: Min assist;HOB flat Sit to Supine - Details  (indicate cue type and reason): vc for precautions Transfers Transfers: Yes Sit to Stand: 4: Min assist Sit to Stand Details (indicate cue type and reason): vc for technique, safety Stand to Sit: 4: Min assist;To bed;With upper extremity assist Stand to Sit Details: vc for hip precautions Ambulation/Gait Ambulation/Gait: Yes Ambulation/Gait Assistance: 4: Min assist Ambulation/Gait Assistance Details (indicate cue type and reason): vc for keeping LLE more in ER., gait sequence Ambulation Distance (Feet): 120 Feet Assistive device: Rolling walker Gait Pattern: Step-to pattern  Posture/Postural Control Posture/Postural Control: No significant limitations Exercise    End of Session PT - End of Session Activity Tolerance: Patient tolerated treatment well Patient left: in bed;with call bell in reach Nurse Communication: Mobility status for transfers General Behavior During Session: [facility] for tasks performed Cognition: [facility] for tasks performed  Rada Hay 07/28/2011, 3:16 PM

## 2011-07-28 NOTE — Progress Notes (Signed)
CSW consulted for SNF placement.  PN reviewed . PT is recommending HHPT on d/c. CSW is available to assist with d/c planning if plan changes and SNF is requested.  Cori Razor  LCSW  [phone]

## 2011-07-28 NOTE — Plan of Care (Signed)
Problem: Consults Goal: Diagnosis- Total Joint Replacement Primary Total Hip LEFT     

## 2011-07-29 LAB — BASIC METABOLIC PANEL
CO2: 27 mEq/L (ref 19–32)
Calcium: 9.2 mg/dL (ref 8.4–10.5)
Chloride: 99 mEq/L (ref 96–112)
Potassium: 4.1 mEq/L (ref 3.5–5.1)
Sodium: 135 mEq/L (ref 135–145)

## 2011-07-29 LAB — CBC
MCH: 28 pg (ref 26.0–34.0)
Platelets: 282 10*3/uL (ref 150–400)
RBC: 3.47 MIL/uL — ABNORMAL LOW (ref 3.87–5.11)
WBC: 19.5 10*3/uL — ABNORMAL HIGH (ref 4.0–10.5)

## 2011-07-29 MED ORDER — PREDNISONE 5 MG PO TABS: 5.0000 mg | ORAL_TABLET | Freq: Every day | ORAL | Status: DC

## 2011-07-29 MED ORDER — TRAMADOL HCL 50 MG PO TABS: 50.0000 mg | ORAL_TABLET | Freq: Four times a day (QID) | ORAL | Status: AC | PRN

## 2011-07-29 MED ORDER — METHOCARBAMOL 500 MG PO TABS: 500.0000 mg | ORAL_TABLET | Freq: Four times a day (QID) | ORAL | Status: AC | PRN

## 2011-07-29 MED ORDER — RIVAROXABAN 10 MG PO TABS: 10.0000 mg | ORAL_TABLET | Freq: Every day | ORAL | Status: DC

## 2011-07-29 MED ORDER — PREDNISONE 5 MG PO TABS: ORAL_TABLET | ORAL | Status: DC

## 2011-07-29 NOTE — Progress Notes (Signed)
CARE MANAGEMENT NOTE 07/29/2011  Patient:  Sierra [name], Sierra [name]   Account Number:  [id number]  Date Initiated:  07/29/2011  Documentation initiated by:  Colleen Can  Subjective/Objective Assessment:   dx osteoarthritis left hip; total hip replacemnt     Action/Plan:   CM spoke with patient and spouse. Plans are for patient to go back to her home in [place] where spouse will be caregiver for patient. Pt states she already has DME from previous surgery.   Anticipated DC Date:  07/29/2011   Anticipated DC Plan:  HOME W HOME HEALTH SERVICES  In-house referral  Clinical Social Worker      DC Planning Services  CM consult      [facility] Choice  HOME HEALTH   Choice offered to / List presented to:  C-1 Patient   DME arranged  NA      DME agency  NA     HH arranged  HH-2 PT      HH agency  [facility]   Status of service:  Completed, signed off Medicare Important Message given?  NA - LOS <3 / Initial given by admissions (If response is "NO", the following Medicare IM given date fields will be blank) Date Medicare IM given:   Date Additional Medicare IM given:    Discharge Disposition:  HOME W HOME HEALTH SERVICES  Comments:  07/29/2011 Raynelle Bring BSN CCM [phone] Pt is requesting [facility] agency that is in network. States she had [place] the last time but knows they are not in network now. Wants [facility]. List of choice HH agencies placed in shadow chart. Pt for discharge today. Staqrt od service tomorrow 07/30/2011.

## 2011-07-29 NOTE — Progress Notes (Signed)
Physical Therapy Treatment Patient Details Name: Sierra [name] MRN: [id number] DOB: 1928-07-04 Today's Date: 07/29/2011 [id number] PT Assessment/Plan  PT - Assessment/Plan Comments on Treatment Session: pt a bit impulsive. continues w/ vry little discomfort. pt plans dc to day after PM PT/ step. PT Plan: Discharge plan remains appropriate;Frequency remains appropriate PT Frequency: 7X/week Follow Up Recommendations: Home health PT PT Goals  Acute Rehab PT Goals Pt will go Sit to Supine/Side: with supervision PT Goal: Sit to Supine/Side - Progress: Progressing toward goal Pt will go Sit to Stand: with supervision PT Goal: Sit to Stand - Progress: Progressing toward goal Pt will go Stand to Sit: with supervision PT Goal: Stand to Sit - Progress: Met Pt will Ambulate: >150 feet;with supervision PT Goal: Ambulate - Progress: Progressing toward goal Pt will Perform Home Exercise Program: with supervision, verbal cues required/provided PT Goal: Perform Home Exercise Program - Progress: Progressing toward goal Additional Goals Additional Goal #1: demo 3/3 posterior Hip precautions PT Goal: Additional Goal #1 - Progress: Progressing toward goal  PT Treatment Precautions/Restrictions  Precautions Precautions: Posterior Hip Restrictions Weight Bearing Restrictions: Yes LLE Weight Bearing: Partial weight bearing LLE Partial Weight Bearing Percentage or Pounds: 25-50% Mobility (including Balance) Bed Mobility Bed Mobility: Yes Sit to Supine: 4: Min assist;HOB flat Sit to Supine - Details (indicate cue type and reason): vc for precautions Transfers Sit to Stand: 5: Supervision;From chair/3-in-1;With upper extremity assist;With armrests Sit to Stand Details (indicate cue type and reason): vc  for precautions Stand to Sit: 5: Supervision;To chair/3-in-1;To bed;With upper extremity assist Stand to Sit Details: vc for hip precautions Ambulation/Gait Ambulation/Gait Assistance: 4: Min  assist Ambulation/Gait Assistance Details (indicate cue type and reason): vc for hip precautions, PWB,  Ambulation Distance (Feet): 150 Feet Assistive device: Rolling walker Gait Pattern: Step-to pattern (vc to try to clear L foot. , vc to turn)    Exercise  Total Joint Exercises Quad Sets: AROM;10 reps;Left Short Arc Quad: AROM;Left;10 reps Heel Slides: AAROM;Left;10 reps Hip ABduction/ADduction: AAROM;Left;10 reps End of Session PT - End of Session Activity Tolerance: Patient tolerated treatment well Patient left: in bed;with call bell in reach;with family/visitor present Nurse Communication: Mobility status for transfers General Behavior During Session: [facility] for tasks performed  Rada Hay 07/29/2011, 2:11 PM

## 2011-07-29 NOTE — Progress Notes (Signed)
Physical Therapy Treatment Patient Details Name: Sierra [name] MRN: [id number] DOB: Sep 05, 1928 Today's Date: 07/29/2011 [id number] PTssessment/Plan  PT - Assessment/Plan Comments on Treatment Session: pt a bit impulsive. continues w/ vry little discomfort. pt plans dc to day after PM PT/ step. PT Plan: Discharge plan remains appropriate;Frequency remains appropriate PT Frequency: 7X/week Follow Up Recommendations: Home health PT PT Goals  Acute Rehab PT Goals Pt will go Sit to Supine/Side: with supervision PT Goal: Sit to Supine/Side - Progress: Progressing toward goal Pt will go Sit to Stand: with supervision PT Goal: Sit to Stand - Progress: Progressing toward goal Pt will go Stand to Sit: with supervision PT Goal: Stand to Sit - Progress: Progressing toward goal Pt will Ambulate: >150 feet;with supervision PT Goal: Ambulate - Progress: Progressing toward goal Pt will Go Up / Down Stairs: 1-2 stairs;with supervision;with rolling walker PT Goal: Up/Down Stairs - Progress: Progressing toward goal Pt will Perform Home Exercise Program: with supervision, verbal cues required/provided PT Goal: Perform Home Exercise Program - Progress: Progressing toward goal Additional Goals Additional Goal #1: demo 3/3 posterior Hip precautions PT Goal: Additional Goal #1 - Progress: Progressing toward goal  PT Treatment Precautions/Restrictions  Precautions Precautions: Posterior Hip Restrictions Weight Bearing Restrictions: Yes LLE Weight Bearing: Partial weight bearing LLE Partial Weight Bearing Percentage or Pounds: 25-50% Mobility (including Balance) Bed Mobility Bed Mobility: Yes Supine to Sit: 4: Min assist Supine to Sit Details (indicate cue type and reason): vc to try to keep L hip from IR as pt moved to  Sit to Supine: 4: Min assist;HOB flat Sit to Supine - Details (indicate cue type and reason): vc for precautions Transfers Sit to Stand: 5: Supervision;From bed Sit to Stand  Details (indicate cue type and reason): vc  for precautions Stand to Sit: 5: Supervision Stand to Sit Details: vc for hip precautions Ambulation/Gait Ambulation/Gait Assistance: 4: Min assist Ambulation/Gait Assistance Details (indicate cue type and reason): vc for safety as pt shoe on L tended to slide off. sock removed to have better fit. cautioned pt to be very careful at home, tended to not clear Ambulation Distance (Feet): 100 Feet Assistive device: Rolling walker Gait Pattern: Step-to pattern;Decreased step length - left;Decreased dorsiflexion - left Stairs: Yes Stairs Assistance: 4: Min assist Stairs Assistance Details (indicate cue type and reason): spouse /pt instructed in backward / re    Exercise  Total Joint Exercises Quad Sets: AROM;10 reps;Left Short Arc Quad: AROM;Left;10 reps Heel Slides: AAROM;Left;10 reps Hip ABduction/ADduction: AAROM;Left;10 reps End of Session PT - End of Session Activity Tolerance: Patient tolerated treatment well Patient left: with call bell in reach;with family/visitor present Nurse Communication: Mobility status for transfers General Behavior During Session: [facility] for tasks performed  Rada Hay 07/29/2011, 2:51 PM

## 2011-07-29 NOTE — Discharge Instructions (Signed)
Hip Rehabilitation, Guidelines Following Surgery The results of a hip operation are greatly improved after range of motion and muscle strengthening exercises. Follow all safety measures which are given to protect your hip. If any of these exercises cause increased pain or swelling in your joint, decrease the amount until you are comfortable again. Then slowly increase the exercises. Call your caregiver if you have problems or questions. HOME CARE INSTRUCTIONS  Most of the following instructions are designed to prevent the dislocation of your new hip.  Do not put on socks or shoes without following the instructions of your caregivers.   Sit on high chairs so your hips are not bent more than 90 degrees.   Sit on chairs with arms. Use the chair arms to help push yourself up when arising.   Keep your leg on the side of the operation out in front of you when standing up.   Arrange for the use of a toilet seat elevator so you are not sitting low.   Do not do any exercises or get in any positions that cause your toes to point in (pigeon toed).   Always sleep with a pillow between your legs. Do not lie on your side in sleep with both knees touching the bed.   You may resume a sexual relationship in one month or when given the OK by your caregiver.   Use crutches or walker as long as suggested by your caregivers.   Begin weight bearing with your caregiver's approval.   Avoid periods of inactivity such as sitting longer than an hour when not asleep. This helps prevent blood clots.   Return to work as instructed by your caregiver.   Do not drive a car for 6 weeks or as instructed.   Do not drive while taking narcotics.   Wear elastic stockings until instructed not to.   Make sure you keep all of your appointments after your operation with all of your doctors and caregivers.  RANGE OF MOTION AND STRENGTHENING EXERCISES These exercises are designed to help you keep full movement of your hip  joint. Follow your caregiver's or physical therapist's instructions. Perform all exercises about fifteen times, three times per day or as directed. Exercise both hips, even if you have had only one joint replacement. These exercises can be done on a training (exercise) mat, on the floor, on a table or on a bed. Use whatever works the best and is most comfortable for you. Use music or television while you are exercising so that the exercises are a pleasant break in your day. This will make your life better with the exercises acting as a break in routine you can look forward to.  Lying on your back, slowly slide your foot toward your buttocks, raising your knee up off the floor. Then slowly slide your foot back down until your leg is straight again.   Lying on your back spread your legs as far apart as you can without causing discomfort.   Lying on your side, raise your upper leg and foot straight up from the floor as far as is comfortable. Slowly lower the leg and repeat.   Lying on your back, tighten up the muscle in the front of your thigh (quadriceps muscles). You can do this by keeping your leg straight and trying to raise your heel off the floor. This helps strengthen the largest muscle supporting your knee.   Lying on your back, tighten up the muscles of your buttocks both   with the legs straight and with the knee bent at a comfortable angle while keeping your heel on the floor.  Document Released: 11/29/2003 Document Revised: 04/16/2011 Document Reviewed: 11/16/2007 Exit[employer] Patient Information 2012 [employer].  Pick up stool softner and laxative for home. Do not submerge incision under water. May shower. Continue to use ice for pain and swelling from surgery. Hip precautions.  Total Hip Protocol. 

## 2011-07-29 NOTE — Progress Notes (Signed)
Subjective: 2 Days Post-Op Procedure(s) (LRB): TOTAL HIP ARTHROPLASTY (Left) Patient reports pain as mild.   Patient is doing well today.  Asking about going home.  Did well with therapy yesterday.  Will two good sessions in today and then home.  Objective: Vital signs in last 24 hours: Temp:  [97.3 F (36.3 C)-98.8 F (37.1 C)] 98.8 F (37.1 C) (03/20 0626) Pulse Rate:  [85-89] 89  (03/20 0626) Resp:  [18-20] 18  (03/20 0626) BP: (113-133)/(61-67) 133/64 mmHg (03/20 0626) SpO2:  [90 %-95 %] 95 % (03/20 0900)  Intake/Output from previous day:  Intake/Output Summary (Last 24 hours) at 07/29/11 1043 Last data filed at 07/29/11 0957  Gross per 24 hour  Intake   1330 ml  Output   1750 ml  Net   -420 ml    Intake/Output this shift: Total I/O In: 240 [P.O.:240] Out: 300 [Urine:300]  Labs:  [facility] 07/29/11 0437 07/28/11 0425  HGB 9.7* 11.0*    Basename 07/29/11 0437 07/28/11 0425  WBC 19.5* 20.0*  RBC 3.47* 3.99  HCT 29.1* 33.4*  PLT 282 290    Basename 07/29/11 0437 07/28/11 0425  NA 135 133*  K 4.1 3.9  CL 99 97  CO2 27 26  BUN 19 14  CREATININE 0.65 0.60  GLUCOSE 184* 175*  CALCIUM 9.2 8.8   No results found for this basename: LABPT:2,INR:2 in the last 72 hours  Exam - Neurovascular intact Sensation intact distally Dressing/Incision - clean, dry, no drainage Motor function intact - moving foot and toes well on exam.   Past Medical History  Diagnosis Date  . Ulcerative colitis   . Hypertension   . Hyperlipemia   . Diverticulitis, colon   . Other specified iron deficiency anemias   . Thrombocytosis   . Leukocytosis   . Esophageal reflux   . Chronic pharyngitis   . Allergic rhinitis   . Acute asthmatic bronchitis   . Hypothyroidism   . Cardiac murmur     DOES NOT CAUSE ANY SYMPTOMS  . Recurrent upper respiratory infection (URI)     ON 07/10/11 PT SAW DR. Joni Fears YOUNG FOR TX OF ACUTE BRONCHITIS --GIVEN  EXTRA PREDNISONE ( IN ADDITION TO THE DAILY  PREDNISONE PT TAKES)  AND PT FINISHED A Z-PAK--STILL HAS A COUGH TODAY 07/15/11-NO FEVER.  Marland Kitchen Blood transfusion   . Arthritis     HX OF RT SHOULDER BURSITIS-AND THE SHOULDER IS HURTING AT PRESENT, PAIN IN LEFT KNEE AT PRESENT  AND PAIN IN LEFT HIP  . IBS (irritable bowel syndrome)   . Balance problem     SINCE BRAIN TUMOR REMOVED IN 2002-BENIGN TUMOR-PT HAS ADRENAL INSUFFICIENCY AND TAKES DAILY PREDNISONE    Assessment/Plan: 2 Days Post-Op Procedure(s) (LRB): TOTAL HIP ARTHROPLASTY (Left) Principal Problem:  *Osteoarthritis of hip Active Problems:  Postop Hyponatremia   Advance diet Up with therapy Discharge home with home health  DVT Prophylaxis - Xarelto Protocol Partial-Weight Bearing 25-50% left Leg DC Home today after therapy.  Sierra [name] 07/29/2011, 10:43 AM

## 2011-08-05 ENCOUNTER — Encounter (HOSPITAL_COMMUNITY): Payer: Self-pay | Admitting: Orthopedic Surgery

## 2011-08-12 NOTE — Discharge Summary (Signed)
Physician Discharge Summary   [name] MRN: [id number] DOB/AGE: 76-Feb-1930 76 y.o.  Admit date: 07/27/2011 Discharge date: 07/29/2011  Primary Diagnosis: Osteoarthritis Left Hip  Admission Diagnoses: Past Medical History  Diagnosis Date  . Ulcerative colitis   . Hypertension   . Hyperlipemia   . Diverticulitis, colon   . Other specified iron deficiency anemias   . Thrombocytosis   . Leukocytosis   . Esophageal reflux   . Chronic pharyngitis   . Allergic rhinitis   . Acute asthmatic bronchitis   . Hypothyroidism   . Cardiac murmur     DOES NOT CAUSE ANY SYMPTOMS  . Recurrent upper respiratory infection (URI)     ON 07/10/11 PT SAW DR. Joni Fears YOUNG FOR TX OF ACUTE BRONCHITIS --GIVEN  EXTRA PREDNISONE ( IN ADDITION TO THE DAILY PREDNISONE PT TAKES)  AND PT FINISHED A Z-PAK--STILL HAS A COUGH TODAY 07/15/11-NO FEVER.  Marland Kitchen Blood transfusion   . Arthritis     HX OF RT SHOULDER BURSITIS-AND THE SHOULDER IS HURTING AT PRESENT, PAIN IN LEFT KNEE AT PRESENT  AND PAIN IN LEFT HIP  . IBS (irritable bowel syndrome)   . Balance problem     SINCE BRAIN TUMOR REMOVED IN 2002-BENIGN TUMOR-PT HAS ADRENAL INSUFFICIENCY AND TAKES DAILY PREDNISONE    Discharge Diagnoses:  Principal Problem:  *Osteoarthritis of hip Active Problems:  Postop Hyponatremia   Procedure: Procedure(s) (LRB): TOTAL HIP ARTHROPLASTY (Left)   Consults: None  HPI: Sierra [name] is a 78 y.o. female with end stage arthritis of her left hip with progressively worsening pain and dysfunction. Pain occurs with activity and rest including pain at night. She has tried analgesics, protected weight bearing and rest without benefit. Pain is too severe to attempt physical therapy. Radiographs demonstrate bone on bone arthritis with subchondral cyst formation. She presents now for left THA.  Laboratory Data: Hospital Outpatient Visit on 07/15/2011  Component Date Value Range Status  . MRSA, PCR  07/15/2011  NEGATIVE  NEGATIVE Final  . Staphylococcus aureus  07/15/2011 NEGATIVE  NEGATIVE Final   Comment:                                 The Xpert SA Assay (FDA                          approved for NASAL specimens                          only), is one component of                          a comprehensive surveillance                          program.  It is not intended                          to diagnose infection nor to                          guide or monitor treatment.  Marland Kitchen aPTT (seconds) 07/15/2011 29  24-37 Final  . WBC (K/uL) 07/15/2011 16.2* 4.0-10.5 Final  . RBC (MIL/uL) 07/15/2011 4.91  3.87-5.11 Final  .  Hemoglobin (g/dL) 86/57/8469 62.9  52.8-41.3 Final  . HCT (%) 07/15/2011 41.3  36.0-46.0 Final  . MCV (fL) 07/15/2011 84.1  78.0-100.0 Final  . MCH (pg) 07/15/2011 27.3  26.0-34.0 Final  . MCHC (g/dL) 24/40/1027 25.3  66.4-40.3 Final  . RDW (%) 07/15/2011 15.4  11.5-15.5 Final  . Platelets (K/uL) 07/15/2011 410* 150-400 Final  . Sodium (mEq/L) 07/15/2011 134* 135-145 Final  . Potassium (mEq/L) 07/15/2011 3.7  3.5-5.1 Final  . Chloride (mEq/L) 07/15/2011 96  96-112 Final  . CO2 (mEq/L) 07/15/2011 25  19-32 Final  . Glucose, Bld (mg/dL) 47/42/5956 387* 56-43 Final  . BUN (mg/dL) 32/95/1884 22  1-66 Final  . Creatinine, Ser (mg/dL) 11/08/1599 0.93  2.35-5.73 Final  . Calcium (mg/dL) 22/06/5425 06.2  3.7-62.8 Final  . Total Protein (g/dL) 31/51/7616 7.8  0.7-3.7 Final  . Albumin (g/dL) 10/62/6948 3.9  5.4-6.2 Final  . AST (U/L) 07/15/2011 15  0-37 Final  . ALT (U/L) 07/15/2011 21  0-35 Final  . Alkaline Phosphatase (U/L) 07/15/2011 57  39-117 Final  . Total Bilirubin (mg/dL) 70/35/0093 0.2* 8.1-8.2 Final  . GFR calc non Af Amer (mL/min) 07/15/2011 68* >90 Final  . GFR calc Af Amer (mL/min) 07/15/2011 79* >90 Final   Comment:                                 The eGFR has been calculated                          using the CKD EPI equation.                          This  calculation has not been                          validated in all clinical                          situations.                          eGFR's persistently                          <90 mL/min signify                          possible Chronic Kidney Disease.  Marland Kitchen Prothrombin Time (seconds) 07/15/2011 14.0  11.6-15.2 Final  . INR  07/15/2011 1.06  0.00-1.49 Final  . Color, Urine  07/15/2011 YELLOW  YELLOW Final  . APPearance  07/15/2011 CLEAR  CLEAR Final  . Specific Gravity, Urine  07/15/2011 1.020  1.005-1.030 Final  . pH  07/15/2011 5.5  5.0-8.0 Final  . Glucose, UA (mg/dL) 99/37/1696 789* NEGATIVE Final  . Hgb urine dipstick  07/15/2011 MODERATE* NEGATIVE Final  . Bilirubin Urine  07/15/2011 NEGATIVE  NEGATIVE Final  . Ketones, ur (mg/dL) 38/02/1750 NEGATIVE  NEGATIVE Final  . Protein, ur (mg/dL) 02/58/5277 NEGATIVE  NEGATIVE Final  . Urobilinogen, UA (mg/dL) 82/42/3536 0.2  1.4-4.3 Final  . Nitrite  07/15/2011 NEGATIVE  NEGATIVE Final  . Leukocytes, UA  07/15/2011 NEGATIVE  NEGATIVE Final  . Squamous Epithelial / LPF  07/15/2011 RARE  RARE Final  .  WBC, UA (WBC/hpf) 07/15/2011 0-2  <3 Final  . RBC / HPF (RBC/hpf) 07/15/2011 0-2  <3 Final  . Bacteria, UA  07/15/2011 RARE  RARE Final   No results found for this basename: HGB:5 in the last 72 hours No results found for this basename: WBC:2,RBC:2,HCT:2,PLT:2 in the last 72 hours No results found for this basename: NA:2,K:2,CL:2,CO2:2,BUN:2,CREATININE:2,GLUCOSE:2,CALCIUM:2 in the last 72 hours No results found for this basename: LABPT:2,INR:2 in the last 72 hours  X-Rays: Chest 2 View  07/15/2011  *RADIOLOGY REPORT*  Clinical Data: Preop left hip replacement.  Hypertension.  CHEST - 2 VIEW  Comparison: 05/15/2011  Findings: Mild hyperinflation. Heart and mediastinal contours are within normal limits.  No focal opacities or effusions.  No acute bony abnormality.  IMPRESSION: Mild hyperinflation.  No active disease.  Original Report  Authenticated By: Cyndie Chime, M.D.   X-ray Hip Left Ap And Lateral  07/15/2011  *RADIOLOGY REPORT*  Clinical Data: Preop left total hip replacement.  LEFT HIP - COMPLETE 2+ VIEW  Comparison: None.  Findings: Prior right hip replacement.  Advanced degenerative changes in the left hip with joint space loss and osteophyte formation.  SI joints are symmetric and unremarkable. No acute bony abnormality.  Specifically, no fracture, subluxation, or dislocation.  Soft tissues are intact.  IMPRESSION: Advanced degenerative changes in the left hip.  No acute findings.  Original Report Authenticated By: Cyndie Chime, M.D.   Dg Pelvis Portable  07/27/2011  *RADIOLOGY REPORT*  Clinical Data: Postop left total hip.  PORTABLE PELVIS  Comparison: Abdomen one-view 04/24/2010.  Findings: Satisfactory left total hip replacement.  Acetabular component well positioned.  No adverse features.  Surgical drain on the left.  Right total hip unremarkable.  IMPRESSION: As above.  Original Report Authenticated By: Elsie Stain, M.D.   Dg Hip Portable 1 View Left  07/27/2011  *RADIOLOGY REPORT*  Clinical Data: Postop  PORTABLE LEFT HIP - 1 VIEW  Comparison: None.  Findings: Left total hip appears satisfactory with femoral and acetabular components.  No dislocation.  No adverse features.  IMPRESSION: As above.  Original Report Authenticated By: Elsie Stain, M.D.    EKG: Orders placed in visit on 07/15/11  . EKG 12-LEAD     Hospital Course: Patient was admitted to [facility] and taken to the OR and underwent the above state procedure without complications.  Patient tolerated the procedure well and was later transferred to the recovery room and then to the orthopaedic floor for postoperative care.  They were given PO and IV analgesics for pain control following their surgery.  They were given 24 hours of postoperative antibiotics and started on DVT prophylaxis.   PT and OT were ordered for total joint protocol.   Discharge planning consulted to help with postop disposition and equipment needs.  Patient had a very good night on the evening of surgery and started to get up with therapy on day one.  Hemovac drain was pulled without difficulty.  Continued to progress with therapy into day two.  Dressing was changed on day two and the incision was healing well.  Patient was seen in rounds and was asking about going home. Did well with therapy and went home on the afternoon of day two.  Discharge Medications: Prior to Admission medications   Medication Sig Start Date End Date Taking? Authorizing Provider  albuterol-ipratropium (COMBIVENT) 18-103 MCG/ACT inhaler Inhale 2 puffs into the lungs every 6 (six) hours as needed. Wheezing    Yes  Historical Provider, MD  amLODipine (NORVASC) 2.5 MG tablet Take 2.5 mg by mouth daily after breakfast.   Yes Historical Provider, MD  EPIPEN 2-PAK 0.3 MG/0.3ML DEVI Inject 0.3 mg into the muscle once as needed. Allergic reaction  09/26/10  Yes Historical Provider, MD  fexofenadine (ALLEGRA) 180 MG tablet Take 60-180 mg by mouth daily as needed. Allergies  07/09/11  Yes Waymon Budge, MD  FLOVENT HFA 110 MCG/ACT inhaler USE ONE (1) PUFF(S) EVERY 12 HOURS 05/15/11 05/14/12 Yes Clinton D Young, MD  levothyroxine (SYNTHROID, LEVOTHROID) 75 MCG tablet Take 75 mcg by mouth daily before breakfast.    Yes Historical Provider, MD  omeprazole (PRILOSEC) 20 MG capsule Take 20 mg by mouth daily.    Yes Historical Provider, MD  Polyethyl Glycol-Propyl Glycol (SYSTANE) 0.4-0.3 % SOLN Apply 1 drop to eye 2 (two) times daily as needed. Dry eyes    Yes Historical Provider, MD  albuterol (PROVENTIL) (2.5 MG/3ML) 0.083% nebulizer solution Take 3 mLs (2.5 mg total) by nebulization every 6 (six) hours as needed for wheezing. 07/16/11 07/15/12  Waymon Budge, MD  predniSONE (DELTASONE) 5 MG tablet Take two tablets (10 mg) tonight, then take one tablet (5 mg) PO twice a day tomorrow 07/30/2011, then resume 5  mg PO daily on 07/31/2011 07/29/11   Matthew Pais Julien Girt, PA  rivaroxaban (XARELTO) 10 MG TABS tablet Take 1 tablet (10 mg total) by mouth daily with breakfast. 07/29/11   Kalaya Infantino Julien Girt, PA  Spacer/Aero-Holding Chambers (AEROCHAMBER MV) inhaler Use as instructed with flovent  110 MCG ONE PUFF TWICE A DAY    Historical Provider, MD  trimethoprim (TRIMPEX) 100 MG tablet Take 50 mg by mouth 3 (three) times a week. Monday, Wednesday and Friday FOR PREVENTION OF CYSTITIS FLARE UPS    Historical Provider, MD    Diet: heart healthy  Activity:PWB No bending hip over 90 degrees- A "L" Angle Do not cross legs Do not let foot roll inward  When turning these patients a pillow should be placed between the patient's legs to prevent crossing.  Patients should have the affected knee fully extended when trying to sit or stand from all surfaces to prevent excessive hip flexion.  When ambulating and turning toward the affected side the affected leg should have the toes turned out prior to moving the walker and the rest of patient's body as to prevent internal rotation/ turning in of the leg.  Abduction pillows are the most effective way to prevent a patient from not crossing legs or turning toes in at rest. If an abduction pillow is not ordered placing a regular pillow length wise between the patient's legs is also an effective reminder.  It is imperative that these precautions be maintained so that the surgical hip does not dislocate.   Follow-up:in 2 weeks  Disposition: Home  Discharged Condition: good   Discharge Orders    Future Appointments: Provider: Department: Dept Phone: Center:   08/25/2011 2:00 PM Waymon Budge, MD [facility] [phone] None     Future Orders Please Complete By Expires   Diet - low sodium heart healthy      Call MD / Call 911      Comments:   If you experience chest pain or shortness of breath, CALL 911 and be transported to the hospital emergency room.  If  you develope a fever above 101 F, pus (white drainage) or increased drainage or redness at the wound, or calf pain, call your surgeon's office.  Constipation Prevention      Comments:   Drink plenty of fluids.  Prune juice may be helpful.  You may use a stool softener, such as Colace (over the counter) 100 mg twice a day.  Use MiraLax (over the counter) for constipation as needed.   Increase activity slowly as tolerated      Weight Bearing as taught in Physical Therapy      Comments:   Use a walker or crutches as instructed.   Discharge instructions      Comments:   Pick up stool softner and laxative for home. Do not submerge incision under water. May shower. Continue to use ice for pain and swelling from surgery. Hip precautions.  Total Hip Protocol.    Driving restrictions      Comments:   No driving   Lifting restrictions      Comments:   No lifting   Follow the hip precautions as taught in Physical Therapy      Change dressing      Comments:   You may change your dressing daily with sterile 4 x 4 inch gauze dressing and paper tape.   TED hose      Comments:   Use stockings (TED hose) for 3 weeks on both leg(s).  You may remove them at night for sleeping.     Medication List  As of 08/12/2011 10:26 PM   STOP taking these medications         acetaminophen 500 MG tablet      amoxicillin-clavulanate 500-125 MG per tablet      b complex vitamins tablet      CITRACAL PO      ibuprofen 200 MG tablet      OMEGA 3 1200 MG Caps      ONE-A-DAY WOMENS PO      Vitamin D 2000 UNITS tablet         TAKE these medications         AEROCHAMBER MV inhaler   Use as instructed with flovent  110 MCG ONE PUFF TWICE A DAY      albuterol (2.5 MG/3ML) 0.083% nebulizer solution   Commonly known as: PROVENTIL   Take 3 mLs (2.5 mg total) by nebulization every 6 (six) hours as needed for wheezing.      albuterol-ipratropium 18-103 MCG/ACT inhaler   Commonly known as: COMBIVENT    Inhale 2 puffs into the lungs every 6 (six) hours as needed. Wheezing        amLODipine 2.5 MG tablet   Commonly known as: NORVASC   Take 2.5 mg by mouth daily after breakfast.      EPIPEN 2-PAK 0.3 mg/0.3 mL Devi   Generic drug: EPINEPHrine   Inject 0.3 mg into the muscle once as needed. Allergic reaction        fexofenadine 180 MG tablet   Commonly known as: ALLEGRA   Take 60-180 mg by mouth daily as needed. Allergies        FLOVENT HFA 110 MCG/ACT inhaler   Generic drug: fluticasone   USE ONE (1) PUFF(S) EVERY 12 HOURS      levothyroxine 75 MCG tablet   Commonly known as: SYNTHROID, LEVOTHROID   Take 75 mcg by mouth daily before breakfast.      omeprazole 20 MG capsule   Commonly known as: PRILOSEC   Take 20 mg by mouth daily.      predniSONE 5 MG tablet   Commonly known as: DELTASONE  Take two tablets (10 mg) tonight, then take one tablet (5 mg) PO twice a day tomorrow 07/30/2011, then resume 5 mg PO daily on 07/31/2011      rivaroxaban 10 MG Tabs tablet   Commonly known as: XARELTO   Take 1 tablet (10 mg total) by mouth daily with breakfast.      SYSTANE 0.4-0.3 % Soln   Generic drug: Polyethyl Glycol-Propyl Glycol   Apply 1 drop to eye 2 (two) times daily as needed. Dry eyes        trimethoprim 100 MG tablet   Commonly known as: TRIMPEX   Take 50 mg by mouth 3 (three) times a week. Monday, Wednesday and Friday  FOR PREVENTION OF CYSTITIS FLARE UPS           Follow-up Information    Follow up with Loanne Drilling, MD. Schedule an appointment as soon as possible for a visit in 2 weeks.   Contact information:   [facility] [address], [address] [place] [address] [phone]          Signed: Patrica Duel 08/12/2011, 10:26 PM

## 2011-08-25 ENCOUNTER — Ambulatory Visit (INDEPENDENT_AMBULATORY_CARE_PROVIDER_SITE_OTHER): Payer: Medicare Other | Admitting: Internal Medicine

## 2011-08-25 ENCOUNTER — Encounter: Payer: Self-pay | Admitting: Internal Medicine

## 2011-08-25 VITALS — BP 122/72 | HR 103 | Ht 64.0 in | Wt 129.8 lb

## 2011-08-25 DIAGNOSIS — J309 Allergic rhinitis, unspecified: Secondary | ICD-10-CM

## 2011-08-25 DIAGNOSIS — J45909 Unspecified asthma, uncomplicated: Secondary | ICD-10-CM

## 2011-08-25 NOTE — Progress Notes (Signed)
Patient ID: ALAZNE QUANT, female    DOB: April 26, 1929, 78 y.o.   MRN: [id number]  HPI 10/27/10- 81 yoF followed for asthma complicated by allergic rhinitis, bronchitis, GERD, hx of atypical AFB. Allergy vaccine- Dr Donneta Romberg. Last here -June 27, 2010- PFTs reviewed at that visit. Since then has done very well with no significant flares. She feels getting away from winter viruses was key.  She just had blepharoplasty.   02/18/11-  70 yoF followed for asthma complicated by allergic rhinitis, bronchitis, GERD, hx of atypical AFB. Allergy vaccine- Dr Donneta Romberg. She has gotten to the summer and early fall feeling very well. Really minimal chest tightness or nasal congestion. She has not been needing her nebulizer meds or rescue inhaler. We discussed the change in delivery system for Combivent. She will get one more of the current style to keep available. She has not been needing antihistamines or nasal spray.  04/27/11-  79 yoF followed for asthma complicated by allergic rhinitis, bronchitis, GERD, hx of atypical AFB. Allergy vaccine- Dr Donneta Romberg. Hx brain tumor/ adrenal insufficiency.  Husband here. Has had flu vaccine. Did well until 10 days to 2 weeks ago. She had overnight onset of cough but seemed to improve until last night when she again got acutely ill with cough, clear foamy sputum, decreased appetite, sore throat, muscle aches. Temperature at home today was 100. She continues prednisone 5 mg daily for adrenal insufficiency after treatment for brain tumor. Due for hip replacement in March.  05/15/11-  69 yoF followed for asthma complicated by allergic rhinitis, bronchitis, GERD, hx of atypical AFB. Allergy vaccine- Dr Donneta Romberg. Hx brain tumor/ adrenal insufficiency.  Husband is with her. She continues to wheeze with the exacerbation for which we saw her December 17. Sputum was cultured but is now foamy and white. She denies fever as she finishes amoxicillin. Sleeping on 3 pillows. Her nebulizer machine  made her too nervous. She had originally taken the Zithromax pack before amoxicillin. Doxycycline caused nausea and she had to stop. She has gone to 2 or 3 separate social functions that she felt she had to attend although she remains very tight in the chest with much cough and shortness of breath.  07/10/11-  62 yoF followed for asthma complicated by allergic rhinitis, bronchitis, GERD, hx of atypical AFB. Allergy vaccine- Dr Donneta Romberg. Hx brain tumor/ adrenal insufficiency.  Pending hip replacement March 18. Feeling mild chest and head congestion over the past 2 days with increased cough. Last night was wheezing. Has not needed her nebulizer machine and denies fever or malaise. Husband just had colon cancer resected.   Acute OV 07/21/11 -- 78 yo woman, hx asthma that has been labile and affected by allergies, GERD. 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 Presents today noting . She was just treated with Augmentin starting 3/8, finished a pred taper from 3/1 back down to chronic 5mg  qd. She is on flovent, combivent prn, nebulized albuterol. Also fexofenedine and omeprazole.  She has had bouts of severe cough, UA noise, wheezing. No fevers, chills, sputum, CP. She is having PND. No GERD sx.  PHARYNGITIS, CHRONIC Flaring following apparent URI and resistant to rx with pred taper and also abx. Exacerbating factors are PND and GERD. She is taking allegra prn, omeprazole qd. Will try to ramp up rx of both factors to see if we can calm her throat down. - Start [place] qd - continue allegra - increase omeprazole to bid temporarily - continue pred 5 and finish  the augmentin - not clear that this is her asthma, sounds like all UA symptoms, although she confuses the two and calls it all asthma. It would be tempting to give her a break from Flovent to see if it would calm her throat down, since this is the driving problem at this time. She will continue for now. Asked her to use her nebs prn and not on a schedule.  - she  will call later this week to update on her progress ROV Dr Maple Hudson in 3 -4 weeks  08/25/11- 81 yoF followed for asthma complicated by allergic rhinitis, bronchitis, GERD, hx of atypical AFB. Allergy vaccine- Dr Monticello Callas. Hx brain tumor/ adrenal insufficiency. PCP Dr Jacky Kindle  [name],fatigue deneis any wheezing, cough  .having  dizzy spells  more frequent. Had left knee replacement March 18 under spinal anesthesia. Wheezed immediately before surgery and was given a nebulizer treatment preop. Since then has done very well and says she feels well today. Did have lightheadedness or dizziness which was evaluated with EKG and CT scan. History of meningioma 11 years ago.  ROS-see HPI Constitutional:   No-   weight loss, night sweats, fevers, chills, fatigue, lassitude. HEENT:   No-  headaches, difficulty swallowing, tooth/dental problems, sore throat,       No-  sneezing, itching, ear ache, nasal congestion, post nasal drip,  CV:  No-   chest pain, orthopnea, PND, swelling in lower extremities, anasarca,  +dizziness,  No-palpitations Resp: No-   shortness of breath with exertion or at rest.              No-   productive cough,  No non-productive cough,  No- coughing up of blood.              No-   change in color of mucus.  No- wheezing.   Skin: No-   rash or lesions. GI:  No-   heartburn, indigestion, abdominal pain, nausea, vomiting,  GU:  MS:  No-   joint pain or swelling.   Neuro-     nothing unusual Psych:  No- change in mood or affect. No depression or anxiety.  No memory loss.  OBJ- Physical Exam General- Alert, Oriented, Affect-appropriate, Distress- none acute,  Looks well Skin- rash-none, lesions- none, excoriation- none Lymphadenopathy- none Head- atraumatic            Eyes- Gross vision intact, PERRLA, conjunctivae and secretions clear            Ears- Hearing, canals-normal            Nose- Clear, no-Septal dev, mucus, polyps, erosion, perforation              Throat- Mallampati II , mucosa clear , drainage- none, tonsils- atrophic Neck- flexible , trachea midline, no stridor , thyroid nl, carotid no bruit Chest - symmetrical excursion , unlabored           Heart/CV- RRR , no murmur , no gallop  , no rub, nl s1 s2                           - JVD- none , edema- none, stasis changes- none, varices- none           Lung- clear to P&A, wheeze- none, cough- none , dullness-none, rub- none           Chest wall-  Abd- Br/ Gen/ Rectal-  Not done, not indicated Extrem- cyanosis- none, clubbing, none, atrophy- none, strength- nl Neuro- grossly intact to observation

## 2011-08-25 NOTE — Patient Instructions (Signed)
Continue present meds  Please call as needed 

## 2011-08-30 NOTE — Assessment & Plan Note (Signed)
Little seasonal pollen rhinitis at this time.

## 2011-08-30 NOTE — Assessment & Plan Note (Signed)
Currently in remission and looking quite well. Plan-continue present meds.

## 2011-09-17 ENCOUNTER — Ambulatory Visit: Payer: Medicare Other | Admitting: Cardiology

## 2011-09-17 ENCOUNTER — Encounter: Payer: Self-pay | Admitting: *Deleted

## 2011-09-21 ENCOUNTER — Ambulatory Visit (INDEPENDENT_AMBULATORY_CARE_PROVIDER_SITE_OTHER): Payer: Medicare Other | Admitting: Cardiology

## 2011-09-21 ENCOUNTER — Encounter: Payer: Self-pay | Admitting: Cardiology

## 2011-09-21 VITALS — BP 146/74 | HR 88 | Ht 63.0 in | Wt 131.0 lb

## 2011-09-21 DIAGNOSIS — I119 Hypertensive heart disease without heart failure: Secondary | ICD-10-CM | POA: Insufficient documentation

## 2011-09-21 DIAGNOSIS — I491 Atrial premature depolarization: Secondary | ICD-10-CM

## 2011-09-21 DIAGNOSIS — R011 Cardiac murmur, unspecified: Secondary | ICD-10-CM

## 2011-09-21 DIAGNOSIS — R5381 Other malaise: Secondary | ICD-10-CM

## 2011-09-21 DIAGNOSIS — R5383 Other fatigue: Secondary | ICD-10-CM | POA: Insufficient documentation

## 2011-09-21 DIAGNOSIS — R0600 Dyspnea, unspecified: Secondary | ICD-10-CM

## 2011-09-21 DIAGNOSIS — I10 Essential (primary) hypertension: Secondary | ICD-10-CM

## 2011-09-21 NOTE — Assessment & Plan Note (Signed)
The patient has a history of a systolic murmur at the base.  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  With her current symptoms of exertional dyspnea and fatigue we will repeat a an echocardiogram

## 2011-09-21 NOTE — Patient Instructions (Signed)
Your physician has requested that you have an echocardiogram. Echocardiography is a painless test that uses sound waves to create images of your heart. It provides your doctor with information about the size and shape of your heart and how well your heart's chambers and valves are working. This procedure takes approximately one hour. There are no restrictions for this procedure.  Your physician recommends that you continue on your current medications as directed. Please refer to the Current Medication list given to you today.  Follow up as needed  

## 2011-09-21 NOTE — Progress Notes (Signed)
[name] Date of Birth:  1928-11-14 [facility] [address] [place], [place]  [address] [phone]         Fax   [phone]  History of Present Illness: This pleasant 78 year old woman is seen for a followup office visit.  She has a past history of shortness of breath and asthma.  She underwent left hip surgery March 18 48 total hip replacement.  She did well postoperatively and was discharged in the usual length of time.  Postoperatively she did complain of persistent weakness and fatigue.  Dr. Jacky Kindle arranged for a CT angiogram of the chest at [facility] which did not show any blood clots according to the patient.  She states that she was anemic postop but now her hemoglobin is back to 13.6.  Continues to have problems of unsteadiness of gait.  She attributes this to her previous brain surgery for meningioma 11 years ago.  She is on lifelong prednisone following her brain surgery.  He does not have any history of ischemic heart disease.  She had a stress echocardiogram in 2012 which was negative for ischemia.  She has a history of asthma and is followed by Dr. Maple Hudson.  Current Outpatient Prescriptions  Medication Sig Dispense Refill  . albuterol (PROVENTIL) (2.5 MG/3ML) 0.083% nebulizer solution Take 3 mLs (2.5 mg total) by nebulization every 6 (six) hours as needed for wheezing.  100 vial  PRN  . albuterol-ipratropium (COMBIVENT) 18-103 MCG/ACT inhaler Inhale 2 puffs into the lungs every 6 (six) hours as needed. Wheezing       . amLODipine (NORVASC) 2.5 MG tablet Take 2.5 mg by mouth daily after breakfast.      . B Complex Vitamins (VITAMIN-B COMPLEX PO) Take by mouth daily.      . Calcium Citrate (CITRACAL PO) Take by mouth. Taking 630mg  bid      . Cholecalciferol (VITAMIN D PO) Take by mouth. Taking 2000 daily      . EPIPEN 2-PAK 0.3 MG/0.3ML DEVI Inject 0.3 mg into the muscle once as needed. Allergic reaction       . fexofenadine (ALLEGRA) 180 MG  tablet Take 60-180 mg by mouth daily as needed. Allergies       . FLOVENT HFA 110 MCG/ACT inhaler USE ONE (1) PUFF(S) EVERY 12 HOURS  12 g  3  . levothyroxine (SYNTHROID, LEVOTHROID) 75 MCG tablet Take 75 mcg by mouth daily before breakfast.       . LORazepam (ATIVAN) 0.5 MG tablet       . Multiple Vitamin (MULTIVITAMIN) tablet Take 1 tablet by mouth daily.      . Omega-3 Fatty Acids (FISH OIL PO) Take by mouth daily.      Marland Kitchen omeprazole (PRILOSEC) 20 MG capsule Take 20 mg by mouth daily.       Bertram Gala Glycol-Propyl Glycol (SYSTANE) 0.4-0.3 % SOLN Apply 1 drop to eye 2 (two) times daily as needed. Dry eyes       . predniSONE (DELTASONE) 5 MG tablet Take two tablets (10 mg) tonight, then take one tablet (5 mg) PO twice a day tomorrow 07/30/2011, then resume 5 mg PO daily on 07/31/2011  30 tablet  0  . Spacer/Aero-Holding Chambers (AEROCHAMBER MV) inhaler Use as instructed with flovent  110 MCG ONE PUFF TWICE A DAY      . traMADol (ULTRAM) 50 MG tablet       . trimethoprim (TRIMPEX) 100 MG tablet Take 50 mg by mouth 3 (  three) times a week. Monday, Wednesday and Friday FOR PREVENTION OF CYSTITIS FLARE UPS      . DISCONTD: Alum & Mag Hydroxide-Simeth (MAGIC MOUTHWASH) SOLN Take 5 mLs by mouth 3 (three) times daily as needed. Thrush         Allergies  Allergen Reactions  . Codeine Nausea And Vomiting  . Doxycycline     Very nauseated  . Restasis (Cyclosporine) Other (See Comments)    Eyes burn   . Ciprofloxacin Rash  . Phenytoin Rash  . Sulfonamide Derivatives Rash    Patient Active Problem List  Diagnoses  . THRUSH  . HYPERLIPIDEMIA  . ANEMIA, IRON DEFICIENCY, MICROCYTIC  . LEUKOCYTOSIS  . THROMBOCYTOSIS  . HYPERTENSION  . Acute bronchitis  . PHARYNGITIS, CHRONIC  . ALLERGIC RHINITIS  . Asthma with bronchitis  . ESOPHAGEAL REFLUX  . ULCERATIVE COLITIS  . DIVERTICULITIS, COLON  . ARTHRITIS  . HOARSENESS, CHRONIC  . CARDIAC MURMUR  . ATYPICAL MYCOBACTERIAL INFECTION  .  HYPOTHYROIDISM  . DYSPNEA ON EXERTION  . PNEUMONIA  . Osteoarthritis of hip  . Postop Hyponatremia  . Dyspnea  . Malaise and fatigue  . Benign hypertensive heart disease without heart failure    History  Smoking status  . Never Smoker   Smokeless tobacco  . Never Used    History  Alcohol Use  . Yes    occ glass of wine    Family History  Problem Relation Age of Onset  . Heart attack Father     deceased    Review of Systems: Constitutional: no fever chills diaphoresis or fatigue or change in weight.  Head and neck: no hearing loss, no epistaxis, no photophobia or visual disturbance. Respiratory: No cough, shortness of breath or wheezing. Cardiovascular: No chest pain peripheral edema, palpitations. Gastrointestinal: No abdominal distention, no abdominal pain, no change in bowel habits hematochezia or melena. Genitourinary: No dysuria, no frequency, no urgency, no nocturia. Musculoskeletal:No arthralgias, no back pain, no gait disturbance or myalgias. Neurological: No dizziness, no headaches, no numbness, no seizures, no syncope, no weakness, no tremors. Hematologic: No lymphadenopathy, no easy bruising. Psychiatric: No confusion, no hallucinations, no sleep disturbance.    Physical Exam: Filed Vitals:   09/21/11 1539  BP: 146/74  Pulse: 88   the general appearance reveals a well-developed elderly woman in no distress.Pupils equal and reactive.   Extraocular Movements are full.  There is no scleral icterus.  The mouth and pharynx are normal.  The neck is supple.  The carotids reveal no bruits.  The jugular venous pressure is normal.  The thyroid is not enlarged.  There is no lymphadenopathy.  The chest is clear to percussion and auscultation. There are no rales or rhonchi. Expansion of the chest is symmetrical.  Heart reveals a soft systolic ejection murmur at the base.  Occasional premature atrial beats are noted.The abdomen is soft and nontender. Bowel sounds are  normal. The liver and spleen are not enlarged. There Are no abdominal masses. There are no bruits.  The pedal pulses are good.  There is no phlebitis or edema.  There is no cyanosis or clubbing. Neurologic exam shows poor balance which she attributes to her previous brain tumor   Assessment / Plan: Patient is to continue same medication.  We will have her return for a two-dimensional echocardiogram to evaluate her dyspnea further.  She continues to have dynamic obstruction she may benefit from additional medication such as beta blockade

## 2011-09-21 NOTE — Assessment & Plan Note (Signed)
We reviewed EKGs from 07/15/2011 which shows normal sinus rhythm with frequent premature atrial beats.  No ischemic changes.

## 2011-09-21 NOTE — Assessment & Plan Note (Addendum)
The patient has a past history of hypertension.  Her blood pressure today is mildly elevated systolic blood pressure.

## 2011-09-25 ENCOUNTER — Other Ambulatory Visit: Payer: Self-pay

## 2011-09-25 ENCOUNTER — Ambulatory Visit (HOSPITAL_COMMUNITY): Payer: Medicare Other | Attending: Internal Medicine

## 2011-09-25 DIAGNOSIS — R0600 Dyspnea, unspecified: Secondary | ICD-10-CM

## 2011-09-25 DIAGNOSIS — I079 Rheumatic tricuspid valve disease, unspecified: Secondary | ICD-10-CM | POA: Insufficient documentation

## 2011-09-25 DIAGNOSIS — R0989 Other specified symptoms and signs involving the circulatory and respiratory systems: Secondary | ICD-10-CM | POA: Insufficient documentation

## 2011-09-25 DIAGNOSIS — I059 Rheumatic mitral valve disease, unspecified: Secondary | ICD-10-CM | POA: Insufficient documentation

## 2011-09-25 DIAGNOSIS — R0609 Other forms of dyspnea: Secondary | ICD-10-CM | POA: Insufficient documentation

## 2011-09-25 DIAGNOSIS — I10 Essential (primary) hypertension: Secondary | ICD-10-CM | POA: Insufficient documentation

## 2011-09-25 DIAGNOSIS — E785 Hyperlipidemia, unspecified: Secondary | ICD-10-CM | POA: Insufficient documentation

## 2011-09-25 DIAGNOSIS — R0602 Shortness of breath: Secondary | ICD-10-CM

## 2011-09-29 ENCOUNTER — Telehealth: Payer: Self-pay | Admitting: Cardiology

## 2011-09-29 NOTE — Telephone Encounter (Signed)
Pt rtn your call she will at this number until 11:50a

## 2011-09-29 NOTE — Telephone Encounter (Signed)
Spoke with patient and advised echo not read by  Dr. Patty Sermons yet but didn't see anything on it that appeared critical.  She is very concerned on what step needs to take next with her shortness of breath and dizziness.  Is comfortable with waiting until next week.  Will forward to  Dr. Patty Sermons and get back to her next week.

## 2011-09-29 NOTE — Telephone Encounter (Signed)
Left message to call back  

## 2011-09-29 NOTE — Telephone Encounter (Signed)
Left message

## 2011-09-29 NOTE — Telephone Encounter (Signed)
Patient request return call  At [phone] or [phone] regarding echo test.

## 2011-10-05 NOTE — Telephone Encounter (Signed)
Please report.  Her echo looked very good with vigorous LV systolic function. Valve function was normal. I note that her Hgb was low in March.  If she is still anemic this could be a large part of her symptoms.  Suggest she see her medical doctor for an update of her labs etc.

## 2011-10-06 ENCOUNTER — Telehealth: Payer: Self-pay | Admitting: Cardiology

## 2011-10-06 NOTE — Telephone Encounter (Signed)
Advised patient

## 2011-10-06 NOTE — Telephone Encounter (Signed)
Advised of echo results, will follow up with PCP

## 2011-10-06 NOTE — Telephone Encounter (Signed)
Pt is upset because she has not heard anything regarding her test and they would like a call today with results

## 2011-10-06 NOTE — Telephone Encounter (Signed)
Message copied by Burnell Blanks on Tue Oct 06, 2011 11:37 AM ------      Message from: Cassell Clement      Created: Mon Oct 05, 2011  5:00 PM       Please report.  Echo shows normal systolic function, good EF 65-70%.  Normal valve function.No cause for her symptoms found on echo.

## 2011-10-09 ENCOUNTER — Encounter: Payer: Self-pay | Admitting: Adult Health

## 2011-10-09 ENCOUNTER — Ambulatory Visit (INDEPENDENT_AMBULATORY_CARE_PROVIDER_SITE_OTHER): Payer: Medicare Other | Admitting: Adult Health

## 2011-10-09 ENCOUNTER — Telehealth: Payer: Self-pay | Admitting: Internal Medicine

## 2011-10-09 VITALS — BP 116/64 | HR 78 | Temp 98.8°F | Ht 63.5 in | Wt 133.8 lb

## 2011-10-09 DIAGNOSIS — J45909 Unspecified asthma, uncomplicated: Secondary | ICD-10-CM

## 2011-10-09 MED ORDER — AMOXICILLIN-POT CLAVULANATE 875-125 MG PO TABS: 1.0000 | ORAL_TABLET | Freq: Two times a day (BID) | ORAL | Status: AC

## 2011-10-09 NOTE — Patient Instructions (Addendum)
Augmentin 875mg  Twice daily  For 7 days  Increase Prednisone 10mg  daily for 1 week then 5mg  daily  Mucinex DM Twice daily As needed  Cough/congesiton  Fluids and rest  Please contact office for sooner follow up if symptoms do not improve or worsen or seek emergency care  follow up Dr. Maple Hudson  In 4 weeks and As needed

## 2011-10-09 NOTE — Telephone Encounter (Signed)
Spoke with pt. She is c/o increased cough x DOE x 1 wk. OV with TP this pm at 3:45 (ok per KW)

## 2011-10-09 NOTE — Progress Notes (Signed)
[name], female    DOB: April 26, 1929, 78 y.o.   MRN: [id number]  HPI 10/27/10- 81 yoF followed for asthma complicated by allergic rhinitis, bronchitis, GERD, hx of atypical AFB. Allergy vaccine- Dr Donneta Romberg. Last here -June 27, 2010- PFTs reviewed at that visit. Since then has done very well with no significant flares. She feels getting away from winter viruses was key.  She just had blepharoplasty.   02/18/11-  70 yoF followed for asthma complicated by allergic rhinitis, bronchitis, GERD, hx of atypical AFB. Allergy vaccine- Dr Donneta Romberg. She has gotten to the summer and early fall feeling very well. Really minimal chest tightness or nasal congestion. She has not been needing her nebulizer meds or rescue inhaler. We discussed the change in delivery system for Combivent. She will get one more of the current style to keep available. She has not been needing antihistamines or nasal spray.  04/27/11-  79 yoF followed for asthma complicated by allergic rhinitis, bronchitis, GERD, hx of atypical AFB. Allergy vaccine- Dr Donneta Romberg. Hx brain tumor/ adrenal insufficiency.  Husband here. Has had flu vaccine. Did well until 10 days to 2 weeks ago. She had overnight onset of cough but seemed to improve until last night when she again got acutely ill with cough, clear foamy sputum, decreased appetite, sore throat, muscle aches. Temperature at home today was 100. She continues prednisone 5 mg daily for adrenal insufficiency after treatment for brain tumor. Due for hip replacement in March.  05/15/11-  69 yoF followed for asthma complicated by allergic rhinitis, bronchitis, GERD, hx of atypical AFB. Allergy vaccine- Dr Donneta Romberg. Hx brain tumor/ adrenal insufficiency.  Husband is with her. She continues to wheeze with the exacerbation for which we saw her December 17. Sputum was cultured but is now foamy and white. She denies fever as she finishes amoxicillin. Sleeping on 3 pillows. Her nebulizer machine  made her too nervous. She had originally taken the Zithromax pack before amoxicillin. Doxycycline caused nausea and she had to stop. She has gone to 2 or 3 separate social functions that she felt she had to attend although she remains very tight in the chest with much cough and shortness of breath.  07/10/11-  62 yoF followed for asthma complicated by allergic rhinitis, bronchitis, GERD, hx of atypical AFB. Allergy vaccine- Dr Donneta Romberg. Hx brain tumor/ adrenal insufficiency.  Pending hip replacement March 18. Feeling mild chest and head congestion over the past 2 days with increased cough. Last night was wheezing. Has not needed her nebulizer machine and denies fever or malaise. Husband just had colon cancer resected.   Acute OV 07/21/11 -- 78 yo woman, hx asthma that has been labile and affected by allergies, GERD. 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 Presents today noting . She was just treated with Augmentin starting 3/8, finished a pred taper from 3/1 back down to chronic 5mg  qd. She is on flovent, combivent prn, nebulized albuterol. Also fexofenedine and omeprazole.  She has had bouts of severe cough, UA noise, wheezing. No fevers, chills, sputum, CP. She is having PND. No GERD sx.  PHARYNGITIS, CHRONIC Flaring following apparent URI and resistant to rx with pred taper and also abx. Exacerbating factors are PND and GERD. She is taking allegra prn, omeprazole qd. Will try to ramp up rx of both factors to see if we can calm her throat down. - Start NSW qd - continue allegra - increase omeprazole to bid temporarily - continue pred 5 and finish  the augmentin - not clear that this is her asthma, sounds like all UA symptoms, although she confuses the two and calls it all asthma. It would be tempting to give her a break from Flovent to see if it would calm her throat down, since this is the driving problem at this time. She will continue for now. Asked her to use her nebs prn and not on a schedule.  - she  will call later this week to update on her progress ROV Dr Maple Hudson in 3 -4 weeks  08/25/11- 81 yoF followed for asthma complicated by allergic rhinitis, bronchitis, GERD, hx of atypical AFB. Allergy vaccine- Dr [place] Callas. Hx brain tumor/ adrenal insufficiency. PCP Dr Jacky Kindle  [name],fatigue deneis any wheezing, cough  .having  dizzy spells  more frequent. Had left knee replacement March 18 under spinal anesthesia. Wheezed immediately before surgery and was given a nebulizer treatment preop. Since then has done very well and says she feels well today. Did have lightheadedness or dizziness which was evaluated with EKG and CT scan. History of meningioma 11 years ago.  10/09/2011 Acute OV  Complains of cough with clear mucus, wheezing, DOE, decreased energy x1week  Barky cough,  Exposed to paint fumes and cough started.  No fever or discolored mucus.  No hemoptysis , no edema.       ROS-see HPI Constitutional:   No-   weight loss, night sweats, fevers, chills, +fatigue, lassitude. HEENT:   No-  headaches, difficulty swallowing, tooth/dental problems, sore throat,       No-  sneezing, itching, ear ache, nasal congestion, post nasal drip,  CV:  No-   chest pain, orthopnea, PND, swelling in lower extremities, anasarca,  +dizziness,  No-palpitations Resp:    No- coughing up of blood.                Skin: No-   rash or lesions. GI:  No-   heartburn, indigestion, abdominal pain, nausea, vomiting,  MS:  No-   joint pain or swelling.   Neuro-     nothing unusual Psych:  No- change in mood or affect. No depression or anxiety.  No memory loss.    OBJ- Physical Exam GEN: A/Ox3; pleasant , NAD, well nourished   HEENT:  [place]/AT,  EACs-clear, TMs-wnl, NOSE-clear, THROAT-clear, no lesions, no postnasal drip or exudate noted.   NECK:  Supple w/ fair ROM; no JVD; normal carotid impulses w/o bruits; no thyromegaly or nodules palpated; no lymphadenopathy.  RESP  Coarse BS   w/o, wheezes/ rales/ or rhonchi.no accessory muscle use, no dullness to percussion  CARD:  RRR, no m/r/g  , no peripheral edema, pulses intact, no cyanosis or clubbing.  GI:   Soft & nt; nml bowel sounds; no organomegaly or masses detected.  Musco: Warm bil, no deformities or joint swelling noted.   Neuro: alert, no focal deficits noted.    Skin: Warm, no lesions or rashes

## 2011-10-09 NOTE — Assessment & Plan Note (Signed)
Exacerbation   Plan;  Augmentin 875mg  Twice daily  For 7 days  Increase Prednisone 10mg  daily for 1 week then 5mg  daily  Mucinex DM Twice daily As needed  Cough/congesiton  Fluids and rest  Please contact office for sooner follow up if symptoms do not improve or worsen or seek emergency care  follow up Dr. Maple Hudson  In 4 weeks and As needed

## 2011-10-14 ENCOUNTER — Telehealth: Payer: Self-pay | Admitting: *Deleted

## 2011-10-14 NOTE — Telephone Encounter (Signed)
I was cleaning out the allergy fridge and found Sierra [name]'s vac..  I spoke with Florentina Addison she said there had been some discussion about her getting her vac.from Dr. Lyla Son office here in our office. Pt.hasn't come in yet and there are no notes that she is to start getting her shots here. Please advise.

## 2011-10-14 NOTE — Telephone Encounter (Signed)
We are stopping allergy vaccine for now.

## 2011-10-14 NOTE — Telephone Encounter (Signed)
Okay.  Thanks.

## 2011-11-10 ENCOUNTER — Encounter: Payer: Self-pay | Admitting: Internal Medicine

## 2011-11-10 ENCOUNTER — Ambulatory Visit (INDEPENDENT_AMBULATORY_CARE_PROVIDER_SITE_OTHER): Payer: Medicare Other | Admitting: Internal Medicine

## 2011-11-10 VITALS — BP 110/62 | HR 91 | Ht 63.5 in | Wt 134.8 lb

## 2011-11-10 DIAGNOSIS — R0989 Other specified symptoms and signs involving the circulatory and respiratory systems: Secondary | ICD-10-CM

## 2011-11-10 DIAGNOSIS — R0600 Dyspnea, unspecified: Secondary | ICD-10-CM

## 2011-11-10 DIAGNOSIS — J45909 Unspecified asthma, uncomplicated: Secondary | ICD-10-CM

## 2011-11-10 NOTE — Patient Instructions (Addendum)
Order 6 MWT    Dx dyspnea

## 2011-11-10 NOTE — Progress Notes (Signed)
[name], female    DOB: April 26, 1929, 78 y.o.   MRN: [id number]  HPI 10/27/10- 81 yoF followed for asthma complicated by allergic rhinitis, bronchitis, GERD, hx of atypical AFB. Allergy vaccine- Dr Donneta Romberg. Last here -June 27, 2010- PFTs reviewed at that visit. Since then has done very well with no significant flares. She feels getting away from winter viruses was key.  She just had blepharoplasty.   02/18/11-  70 yoF followed for asthma complicated by allergic rhinitis, bronchitis, GERD, hx of atypical AFB. Allergy vaccine- Dr Donneta Romberg. She has gotten to the summer and early fall feeling very well. Really minimal chest tightness or nasal congestion. She has not been needing her nebulizer meds or rescue inhaler. We discussed the change in delivery system for Combivent. She will get one more of the current style to keep available. She has not been needing antihistamines or nasal spray.  04/27/11-  79 yoF followed for asthma complicated by allergic rhinitis, bronchitis, GERD, hx of atypical AFB. Allergy vaccine- Dr Donneta Romberg. Hx brain tumor/ adrenal insufficiency.  Husband here. Has had flu vaccine. Did well until 10 days to 2 weeks ago. She had overnight onset of cough but seemed to improve until last night when she again got acutely ill with cough, clear foamy sputum, decreased appetite, sore throat, muscle aches. Temperature at home today was 100. She continues prednisone 5 mg daily for adrenal insufficiency after treatment for brain tumor. Due for hip replacement in March.  05/15/11-  69 yoF followed for asthma complicated by allergic rhinitis, bronchitis, GERD, hx of atypical AFB. Allergy vaccine- Dr Donneta Romberg. Hx brain tumor/ adrenal insufficiency.  Husband is with her. She continues to wheeze with the exacerbation for which we saw her December 17. Sputum was cultured but is now foamy and white. She denies fever as she finishes amoxicillin. Sleeping on 3 pillows. Her nebulizer machine  made her too nervous. She had originally taken the Zithromax pack before amoxicillin. Doxycycline caused nausea and she had to stop. She has gone to 2 or 3 separate social functions that she felt she had to attend although she remains very tight in the chest with much cough and shortness of breath.  07/10/11-  62 yoF followed for asthma complicated by allergic rhinitis, bronchitis, GERD, hx of atypical AFB. Allergy vaccine- Dr Donneta Romberg. Hx brain tumor/ adrenal insufficiency.  Pending hip replacement March 18. Feeling mild chest and head congestion over the past 2 days with increased cough. Last night was wheezing. Has not needed her nebulizer machine and denies fever or malaise. Husband just had colon cancer resected.   Acute OV 07/21/11 -- 78 yo woman, hx asthma that has been labile and affected by allergies, GERD. 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 Presents today noting . She was just treated with Augmentin starting 3/8, finished a pred taper from 3/1 back down to chronic 5mg  qd. She is on flovent, combivent prn, nebulized albuterol. Also fexofenedine and omeprazole.  She has had bouts of severe cough, UA noise, wheezing. No fevers, chills, sputum, CP. She is having PND. No GERD sx.  PHARYNGITIS, CHRONIC Flaring following apparent URI and resistant to rx with pred taper and also abx. Exacerbating factors are PND and GERD. She is taking allegra prn, omeprazole qd. Will try to ramp up rx of both factors to see if we can calm her throat down. - Start [place] qd - continue allegra - increase omeprazole to bid temporarily - continue pred 5 and finish  the augmentin - not clear that this is her asthma, sounds like all UA symptoms, although she confuses the two and calls it all asthma. It would be tempting to give her a break from Flovent to see if it would calm her throat down, since this is the driving problem at this time. She will continue for now. Asked her to use her nebs prn and not on a schedule.  - she  will call later this week to update on her progress ROV Dr Maple Hudson in 3 -4 weeks  08/25/11- 81 yoF followed for asthma complicated by allergic rhinitis, bronchitis, GERD, hx of atypical AFB. Allergy vaccine- Dr [place] Callas. Hx brain tumor/ adrenal insufficiency. PCP Dr Jacky Kindle  [name],fatigue deneis any wheezing, cough  .having  dizzy spells  more frequent. Had left knee replacement March 18 under spinal anesthesia. Wheezed immediately before surgery and was given a nebulizer treatment preop. Since then has done very well and says she feels well today. Did have lightheadedness or dizziness which was evaluated with EKG and CT scan. History of meningioma 11 years ago.  10/09/2011 Acute OV  Complains of cough with clear mucus, wheezing, DOE, decreased energy x1week  Barky cough,  Exposed to paint fumes and cough started.  No fever or discolored mucus.  No hemoptysis , no edema.   11/10/11- 81 yoF followed for asthma complicated by allergic rhinitis, bronchitis, GERD, hx of atypical AFB. Hx brain tumor/ adrenal insufficiency. PCP Dr Jacky Kindle Was seen by a nurse practitioner in May and treated with Augmentin and prednisone increased to 10 mg. She is now back to 5 mg of prednisone daily. Staying indoors to avoid the weather. Short of breath since left hip replacement surgery in March. Occasional sneeze. She dropped off of Dr. Lyla Son allergy vaccine. Cough produces scant phlegm. Denies chest pain. She says recent echocardiogram and EKG were "fine". Dyspnea on exertion. She just stopped to rest making her bed.  CXR- 07/15/11-  IMPRESSION:  Mild hyperinflation. No active disease.  Original Report Authenticated By: Cyndie Chime, M.D.    ROS-see HPI Constitutional:   No-   weight loss, night sweats, fevers, chills, fatigue, lassitude. HEENT:   No-  headaches, difficulty swallowing, tooth/dental problems, sore throat,       No-  sneezing, itching, ear ache, nasal congestion,  post nasal drip,  CV:  No-   chest pain, orthopnea, PND, swelling in lower extremities, anasarca, dizziness, palpitations Resp: + shortness of breath with exertion or at rest.              +  productive cough,  No non-productive cough,  No- coughing up of blood.              No-   change in color of mucus.  No- wheezing.   Skin: No-   rash or lesions. GI:  No-   heartburn, indigestion, abdominal pain, nausea, vomiting,  GU:  MS:  No-   joint pain or swelling.  . Neuro-     nothing unusual Psych:  No- change in mood or affect. No depression or anxiety.  No memory loss.  OBJ- Physical Exam BP 110/62  Pulse 91  Ht 5' 3.5" (1.613 m)  Wt 134 lb 12.8 oz (61.145 kg)  BMI 23.50 kg/m2  SpO2 95%  General- Alert, Oriented, Affect-appropriate, Distress- none acute Skin- rash-none, lesions- none, excoriation- none Lymphadenopathy- none Head- atraumatic  Eyes- Gross vision intact, PERRLA, conjunctivae and secretions clear            Ears- Hearing, canals-normal            Nose- Clear, no-Septal dev, mucus, polyps, erosion, perforation             Throat- Mallampati II , mucosa clear , drainage- none, tonsils- atrophic Neck- flexible , trachea midline, no stridor , thyroid nl, carotid no bruit Chest - symmetrical excursion , unlabored           Heart/CV- RRR , no murmur , no gallop  , no rub, nl s1 s2                           - JVD- none , edema- none, stasis changes- none, varices- none           Lung- clear to P&A, wheeze- none, cough- none , dullness-none, rub- none           Chest wall-  Abd-  Br/ Gen/ Rectal- Not done, not indicated Extrem- cyanosis- none, clubbing, none, atrophy- none, strength- nl Neuro- grossly intact to observation

## 2011-11-14 NOTE — Assessment & Plan Note (Signed)
She is not wheezing now and I'm not sure if she is describing shortness of breath with exertion, or simple debilitation. Plan-6 minute walk test

## 2011-12-25 ENCOUNTER — Ambulatory Visit: Payer: Medicare Other | Admitting: Internal Medicine

## 2012-03-03 ENCOUNTER — Telehealth: Payer: Self-pay | Admitting: Internal Medicine

## 2012-03-03 MED ORDER — AMOXICILLIN 500 MG PO TABS: 500.0000 mg | ORAL_TABLET | Freq: Three times a day (TID) | ORAL | Status: DC

## 2012-03-03 NOTE — Telephone Encounter (Signed)
Called and spoke with pt and she stated that last week she had a cold and not she has cough that is productive with foamy, pale green sputum, and denies any fever.  She is requesting that something be called to her pharmacy.  Please advise. Thanks  Allergies  Allergen Reactions  . Codeine Nausea And Vomiting  . Doxycycline     Very nauseated  . Restasis (Cyclosporine) Other (See Comments)    Eyes burn   . Ciprofloxacin Rash  . Phenytoin Rash  . Sulfonamide Derivatives Rash

## 2012-03-03 NOTE — Telephone Encounter (Signed)
Called and spoke with pt and she is aware of CY recs for the amoxicillin 500 m g  #21  1 po tid.  This has been sent to the pts pharmacy and i have called and she is aware.

## 2012-03-03 NOTE — Telephone Encounter (Signed)
Per CY-okay to give Amoxicillin 500mg #21 take 1 po tid no refills.  

## 2012-03-08 ENCOUNTER — Telehealth: Payer: Self-pay | Admitting: Internal Medicine

## 2012-03-08 DIAGNOSIS — J019 Acute sinusitis, unspecified: Secondary | ICD-10-CM

## 2012-03-08 MED ORDER — MOXIFLOXACIN HCL 400 MG PO TABS: 400.0000 mg | ORAL_TABLET | Freq: Every day | ORAL | Status: DC

## 2012-03-08 NOTE — Telephone Encounter (Signed)
Per CY-give Avelox 400mg  #10 take 1 po qd no refills and order CT sinus limited DX acute sinusitis.

## 2012-03-08 NOTE — Telephone Encounter (Signed)
I spoke with pt-- she was given Amox 500 mg on 03/03/12 and is still not feeling better, c/o deep chest cough w/ foamy green phlem, increase SOB, chest tx, and feels bad in general. Denies any f/c/s/n/v. She is taking mucinex q12hrs and had to use her albuterol last night. Pt is requesting further recs. Please advise Dr. Maple Hudson thanks  Allergies  Allergen Reactions  . Codeine Nausea And Vomiting  . Doxycycline     Very nauseated  . Restasis (Cyclosporine) Other (See Comments)    Eyes burn   . Ciprofloxacin Rash  . Phenytoin Rash  . Sulfonamide Derivatives Rash

## 2012-03-08 NOTE — Telephone Encounter (Signed)
I spoke with pt and is aware of CDY recs. I have sent this to the pharmacy. I have also ordered CT sinus and is aware PCC's will call regarding getting ct sinus set up

## 2012-03-09 ENCOUNTER — Ambulatory Visit (INDEPENDENT_AMBULATORY_CARE_PROVIDER_SITE_OTHER)
Admission: RE | Admit: 2012-03-09 | Discharge: 2012-03-09 | Disposition: A | Discharge status: Home / Self Care | Payer: Medicare Other | Source: Ambulatory Visit | Attending: Internal Medicine | Admitting: Internal Medicine

## 2012-03-09 DIAGNOSIS — J019 Acute sinusitis, unspecified: Secondary | ICD-10-CM

## 2012-03-11 ENCOUNTER — Telehealth: Payer: Self-pay | Admitting: Internal Medicine

## 2012-03-11 NOTE — Telephone Encounter (Signed)
Notes Recorded by Waymon Budge, MD on 03/09/2012 at 8:14 PM CT sinuses- clear without sinusitis  Pt is advised. Carron Curie, CMA

## 2012-03-14 ENCOUNTER — Encounter: Payer: Self-pay | Admitting: Internal Medicine

## 2012-03-14 ENCOUNTER — Ambulatory Visit (INDEPENDENT_AMBULATORY_CARE_PROVIDER_SITE_OTHER): Payer: Medicare Other | Admitting: Internal Medicine

## 2012-03-14 ENCOUNTER — Ambulatory Visit (INDEPENDENT_AMBULATORY_CARE_PROVIDER_SITE_OTHER)
Admission: RE | Admit: 2012-03-14 | Discharge: 2012-03-14 | Disposition: A | Discharge status: Home / Self Care | Payer: Medicare Other | Source: Ambulatory Visit | Attending: Internal Medicine | Admitting: Internal Medicine

## 2012-03-14 VITALS — BP 106/68 | HR 79 | Ht 63.5 in | Wt 130.6 lb

## 2012-03-14 DIAGNOSIS — J45909 Unspecified asthma, uncomplicated: Secondary | ICD-10-CM

## 2012-03-14 DIAGNOSIS — J209 Acute bronchitis, unspecified: Secondary | ICD-10-CM

## 2012-03-14 DIAGNOSIS — R0989 Other specified symptoms and signs involving the circulatory and respiratory systems: Secondary | ICD-10-CM

## 2012-03-14 DIAGNOSIS — R0600 Dyspnea, unspecified: Secondary | ICD-10-CM

## 2012-03-14 DIAGNOSIS — R0609 Other forms of dyspnea: Secondary | ICD-10-CM

## 2012-03-14 MED ORDER — METHYLPREDNISOLONE ACETATE 80 MG/ML IJ SUSP
80.0000 mg | Freq: Once | INTRAMUSCULAR | Status: AC
  Administered 2012-03-14: 80 mg via INTRAMUSCULAR

## 2012-03-14 MED ORDER — LEVALBUTEROL HCL 0.63 MG/3ML IN NEBU
0.6300 mg | INHALATION_SOLUTION | Freq: Once | RESPIRATORY_TRACT | Status: AC
  Administered 2012-03-14: 0.63 mg via RESPIRATORY_TRACT

## 2012-03-14 MED ORDER — PREDNISONE 10 MG PO TABS: ORAL_TABLET | ORAL | Status: DC

## 2012-03-14 NOTE — Progress Notes (Signed)
[name], female    DOB: April 26, 1929, 78 y.o.   MRN: [id number]  HPI 10/27/10- 81 yoF followed for asthma complicated by allergic rhinitis, bronchitis, GERD, hx of atypical AFB. Allergy vaccine- Dr Donneta Romberg. Last here -June 27, 2010- PFTs reviewed at that visit. Since then has done very well with no significant flares. She feels getting away from winter viruses was key.  She just had blepharoplasty.   02/18/11-  70 yoF followed for asthma complicated by allergic rhinitis, bronchitis, GERD, hx of atypical AFB. Allergy vaccine- Dr Donneta Romberg. She has gotten to the summer and early fall feeling very well. Really minimal chest tightness or nasal congestion. She has not been needing her nebulizer meds or rescue inhaler. We discussed the change in delivery system for Combivent. She will get one more of the current style to keep available. She has not been needing antihistamines or nasal spray.  04/27/11-  79 yoF followed for asthma complicated by allergic rhinitis, bronchitis, GERD, hx of atypical AFB. Allergy vaccine- Dr Donneta Romberg. Hx brain tumor/ adrenal insufficiency.  Husband here. Has had flu vaccine. Did well until 10 days to 2 weeks ago. She had overnight onset of cough but seemed to improve until last night when she again got acutely ill with cough, clear foamy sputum, decreased appetite, sore throat, muscle aches. Temperature at home today was 100. She continues prednisone 5 mg daily for adrenal insufficiency after treatment for brain tumor. Due for hip replacement in March.  05/15/11-  69 yoF followed for asthma complicated by allergic rhinitis, bronchitis, GERD, hx of atypical AFB. Allergy vaccine- Dr Donneta Romberg. Hx brain tumor/ adrenal insufficiency.  Husband is with her. She continues to wheeze with the exacerbation for which we saw her December 17. Sputum was cultured but is now foamy and white. She denies fever as she finishes amoxicillin. Sleeping on 3 pillows. Her nebulizer machine  made her too nervous. She had originally taken the Zithromax pack before amoxicillin. Doxycycline caused nausea and she had to stop. She has gone to 2 or 3 separate social functions that she felt she had to attend although she remains very tight in the chest with much cough and shortness of breath.  07/10/11-  62 yoF followed for asthma complicated by allergic rhinitis, bronchitis, GERD, hx of atypical AFB. Allergy vaccine- Dr Donneta Romberg. Hx brain tumor/ adrenal insufficiency.  Pending hip replacement March 18. Feeling mild chest and head congestion over the past 2 days with increased cough. Last night was wheezing. Has not needed her nebulizer machine and denies fever or malaise. Husband just had colon cancer resected.   Acute OV 07/21/11 -- 78 yo woman, hx asthma that has been labile and affected by allergies, GERD. 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 Presents today noting . She was just treated with Augmentin starting 3/8, finished a pred taper from 3/1 back down to chronic 5mg  qd. She is on flovent, combivent prn, nebulized albuterol. Also fexofenedine and omeprazole.  She has had bouts of severe cough, UA noise, wheezing. No fevers, chills, sputum, CP. She is having PND. No GERD sx.  PHARYNGITIS, CHRONIC Flaring following apparent URI and resistant to rx with pred taper and also abx. Exacerbating factors are PND and GERD. She is taking allegra prn, omeprazole qd. Will try to ramp up rx of both factors to see if we can calm her throat down. - Start NSW qd - continue allegra - increase omeprazole to bid temporarily - continue pred 5 and finish  the augmentin - not clear that this is her asthma, sounds like all UA symptoms, although she confuses the two and calls it all asthma. It would be tempting to give her a break from Flovent to see if it would calm her throat down, since this is the driving problem at this time. She will continue for now. Asked her to use her nebs prn and not on a schedule.  - she  will call later this week to update on her progress ROV Dr Maple Hudson in 3 -4 weeks  08/25/11- 81 yoF followed for asthma complicated by allergic rhinitis, bronchitis, GERD, hx of atypical AFB. Allergy vaccine- Dr Weber Callas. Hx brain tumor/ adrenal insufficiency. PCP Dr Jacky Kindle  [name],fatigue deneis any wheezing, cough  .having  dizzy spells  more frequent. Had left knee replacement March 18 under spinal anesthesia. Wheezed immediately before surgery and was given a nebulizer treatment preop. Since then has done very well and says she feels well today. Did have lightheadedness or dizziness which was evaluated with EKG and CT scan. History of meningioma 11 years ago.  10/09/2011 Acute OV  Complains of cough with clear mucus, wheezing, DOE, decreased energy x1week  Barky cough,  Exposed to paint fumes and cough started.  No fever or discolored mucus.  No hemoptysis , no edema.   11/10/11- 81 yoF followed for asthma complicated by allergic rhinitis, bronchitis, GERD, hx of atypical AFB. Hx brain tumor/ adrenal insufficiency. PCP Dr Jacky Kindle Was seen by a nurse practitioner in May and treated with Augmentin and prednisone increased to 10 mg. She is now back to 5 mg of prednisone daily. Staying indoors to avoid the weather. Short of breath since left hip replacement surgery in March. Occasional sneeze. She dropped off of Dr. Lyla Son allergy vaccine. Cough produces scant phlegm. Denies chest pain. She says recent echocardiogram and EKG were "fine". Dyspnea on exertion. She just stopped to rest making her bed.  CXR- 07/15/11-  IMPRESSION:  Mild hyperinflation. No active disease.  Original Report Authenticated By: Cyndie Chime, M.D.   03/14/12- 81 yoF followed for asthma complicated by allergic rhinitis, bronchitis, GERD, hx of atypical AFB. Hx brain tumor/ adrenal insufficiency. PCP Dr Jacky Kindle    husband here FOLLOWS FOR: still having alot of cough-thick foamy light green in  color; afraid to take Avelox after reading about side effects because her right shoulder is still healing.  Acute illness started as a cold. Now deep rattle comes and goes, worse over the past 3 weeks but she sleeps well. Coughs out "clear foam". She remains on prednisone 5 mg daily.  ROS-see HPI Constitutional:   No-   weight loss, night sweats, fevers, chills, fatigue, lassitude. HEENT:   No-  headaches, difficulty swallowing, tooth/dental problems, sore throat,       No-  sneezing, itching, ear ache, nasal congestion, post nasal drip,  CV:  No-   chest pain, orthopnea, PND, swelling in lower extremities, anasarca, dizziness, palpitations Resp: + shortness of breath with exertion or at rest.              +  productive cough,  No non-productive cough,  No- coughing up of blood.              No-   change in color of mucus.  + wheezing.   Skin: No-   rash or lesions. GI:  No-   heartburn, indigestion, abdominal pain, nausea, vomiting,  GU:  MS:  No-   joint pain or swelling.  . Neuro-     nothing unusual Psych:  No- change in mood or affect. No depression or anxiety.  No memory loss.  OBJ- Physical Exam BP 106/68  Pulse 79  Ht 5' 3.5" (1.613 m)  Wt 59.24 kg (130 lb 9.6 oz)  BMI 22.77 kg/m2  SpO2 97% General- Alert, Oriented, Affect-appropriate, Distress- none acute Skin- rash-none, lesions- none, excoriation- none Lymphadenopathy- none Head- atraumatic            Eyes- Gross vision intact, PERRLA, conjunctivae and secretions clear            Ears- Hearing, canals-normal            Nose- Clear, no-Septal dev, mucus, polyps, erosion, perforation             Throat- Mallampati II , mucosa clear , drainage- none, tonsils- atrophic Neck- flexible , trachea midline, no stridor , thyroid nl, carotid no bruit Chest - symmetrical excursion , unlabored           Heart/CV- RRR , no murmur , no gallop  , no rub, nl s1 s2                           - JVD- none , edema- none, stasis changes-  none, varices- none           Lung- + wheeze and coarse wet rhonchi, cough+ productive thick clear mucus , dullness-none, rub- none           Chest wall-  Abd-  Br/ Gen/ Rectal- Not done, not indicated Extrem- cyanosis- none, clubbing, none, atrophy- none, strength- nl Neuro- grossly intact to observation

## 2012-03-14 NOTE — Patient Instructions (Addendum)
Script for prednisone taper sent. Then go back to your daily 5 mg to maintain.  Neb xop 0.63  Depo 80  Order CXR--     Dx acute bronchitis

## 2012-03-15 ENCOUNTER — Other Ambulatory Visit: Payer: Self-pay | Admitting: Internal Medicine

## 2012-03-16 ENCOUNTER — Telehealth: Payer: Self-pay | Admitting: Internal Medicine

## 2012-03-16 NOTE — Telephone Encounter (Signed)
Called spoke with patient who stated that she had been doing well since 11.4.13 ov w/ CY, however when she was at church today she began wheezing (the heat was not turned on in the church) and has developed a prod cough with white foamy mucus mixed with "a little bit of green."  Pt denies any tightness, increased SOB.  Patient stated that she was given a neb tx at [facility] (her pharmacist is a member of her church) and a Art gallery manager.    Pt stated that her current combivent inhaler does not expire until 01/2013 and asked if she could continue to use this > advised yes, and when she is done with that inhaler we will be happy to show her how to use the new respimat.  Pt is asking if she is to take her flovent scheduled with the combivent?  I advised pt that she needs to take her combivent 4 times daily as scheduled, and that she may take her albuterol neb q6h prn in addition to that if she needs it for wheezing/shortness of breath.  Dr Maple Hudson please advise, thanks.

## 2012-03-16 NOTE — Telephone Encounter (Signed)
Called spoke with patient, advised she is to continue the flovent bid in addition to the combivent qid and the albuterol neb q6h prn in-between.  Advised the flovent and combivent are okay to be taken "together" in the morning and evening and advised her to brush/rinse/gargle after each use of these inhaled medications.  Pt verbalized her understanding and will call back if her wheezing does not improve or worsen.  Nothing further needed at this time; will sign off.

## 2012-03-16 NOTE — Telephone Encounter (Signed)
Agree with nurse instructions. Patient should continue to use her flovent steroid inhaler, 2 puffs then rinse mouth, twice daily.

## 2012-03-18 ENCOUNTER — Telehealth: Payer: Self-pay | Admitting: Internal Medicine

## 2012-03-18 MED ORDER — IPRATROPIUM-ALBUTEROL 20-100 MCG/ACT IN AERS: 1.0000 | INHALATION_SPRAY | Freq: Four times a day (QID) | RESPIRATORY_TRACT | Status: DC

## 2012-03-18 NOTE — Telephone Encounter (Signed)
Pt aware and has already received the new Combivent Respimat from the pharmacy. New RX sent and old Combivent taken off of her med list.

## 2012-03-24 ENCOUNTER — Telehealth: Payer: Self-pay | Admitting: Internal Medicine

## 2012-03-24 NOTE — Progress Notes (Signed)
Quick Note:  Pt aware of results. ______ 

## 2012-03-24 NOTE — Progress Notes (Signed)
Quick Note:  LMTCB ______ 

## 2012-03-24 NOTE — Telephone Encounter (Signed)
Spoke with patient-aware of CXR results.  

## 2012-03-25 NOTE — Assessment & Plan Note (Signed)
Acute bronchitis exacerbation Plan-chest x-ray, prednisone taper, nebulizer treatment, Depo-Medrol

## 2012-03-25 NOTE — Progress Notes (Signed)
Documentation for 6 minute walk test 

## 2012-04-28 ENCOUNTER — Telehealth: Payer: Self-pay | Admitting: Internal Medicine

## 2012-04-28 NOTE — Telephone Encounter (Signed)
Per CY, he will see pt Fri., 04/29/12 @ 11:30. Pt notified and we were unable to reac Malachi Bonds at [facility]. Imcorrect number and that office is now closed.

## 2012-04-29 ENCOUNTER — Ambulatory Visit (INDEPENDENT_AMBULATORY_CARE_PROVIDER_SITE_OTHER): Payer: Medicare Other | Admitting: Internal Medicine

## 2012-04-29 ENCOUNTER — Encounter: Payer: Self-pay | Admitting: Internal Medicine

## 2012-04-29 VITALS — BP 122/80 | HR 99 | Ht 63.5 in | Wt 129.8 lb

## 2012-04-29 DIAGNOSIS — J45909 Unspecified asthma, uncomplicated: Secondary | ICD-10-CM

## 2012-04-29 MED ORDER — ALBUTEROL SULFATE HFA 108 (90 BASE) MCG/ACT IN AERS: 2.0000 | INHALATION_SPRAY | Freq: Four times a day (QID) | RESPIRATORY_TRACT | Status: DC | PRN

## 2012-04-29 MED ORDER — FLUTICASONE PROPIONATE HFA 110 MCG/ACT IN AERO: INHALATION_SPRAY | RESPIRATORY_TRACT | Status: DC

## 2012-04-29 NOTE — Patient Instructions (Addendum)
Script sent for albuterol HFA rescue albuterol inhaler to use up to 2 puffs, 4 times daily if needed, instead of Combivent.   Script sent for Flovent 110 refill   The prednisone schedule from Dr Lanell Matar office:  Using 5 mg pills 3 tabs, twice daily , x 3 days 2 tabs, twice daily x 3 days 1 tab twice daily x 3 days Then 1 tab daily to total one month

## 2012-04-29 NOTE — Progress Notes (Signed)
[name], female    DOB: April 26, 1929, 78 y.o.   MRN: [id number]  HPI 10/27/10- 81 yoF followed for asthma complicated by allergic rhinitis, bronchitis, GERD, hx of atypical AFB. Allergy vaccine- Dr Donneta Romberg. Last here -June 27, 2010- PFTs reviewed at that visit. Since then has done very well with no significant flares. She feels getting away from winter viruses was key.  She just had blepharoplasty.   02/18/11-  70 yoF followed for asthma complicated by allergic rhinitis, bronchitis, GERD, hx of atypical AFB. Allergy vaccine- Dr Donneta Romberg. She has gotten to the summer and early fall feeling very well. Really minimal chest tightness or nasal congestion. She has not been needing her nebulizer meds or rescue inhaler. We discussed the change in delivery system for Combivent. She will get one more of the current style to keep available. She has not been needing antihistamines or nasal spray.  04/27/11-  79 yoF followed for asthma complicated by allergic rhinitis, bronchitis, GERD, hx of atypical AFB. Allergy vaccine- Dr Donneta Romberg. Hx brain tumor/ adrenal insufficiency.  Husband here. Has had flu vaccine. Did well until 10 days to 2 weeks ago. She had overnight onset of cough but seemed to improve until last night when she again got acutely ill with cough, clear foamy sputum, decreased appetite, sore throat, muscle aches. Temperature at home today was 100. She continues prednisone 5 mg daily for adrenal insufficiency after treatment for brain tumor. Due for hip replacement in March.  05/15/11-  69 yoF followed for asthma complicated by allergic rhinitis, bronchitis, GERD, hx of atypical AFB. Allergy vaccine- Dr Donneta Romberg. Hx brain tumor/ adrenal insufficiency.  Husband is with her. She continues to wheeze with the exacerbation for which we saw her December 17. Sputum was cultured but is now foamy and white. She denies fever as she finishes amoxicillin. Sleeping on 3 pillows. Her nebulizer machine  made her too nervous. She had originally taken the Zithromax pack before amoxicillin. Doxycycline caused nausea and she had to stop. She has gone to 2 or 3 separate social functions that she felt she had to attend although she remains very tight in the chest with much cough and shortness of breath.  07/10/11-  62 yoF followed for asthma complicated by allergic rhinitis, bronchitis, GERD, hx of atypical AFB. Allergy vaccine- Dr Donneta Romberg. Hx brain tumor/ adrenal insufficiency.  Pending hip replacement March 18. Feeling mild chest and head congestion over the past 2 days with increased cough. Last night was wheezing. Has not needed her nebulizer machine and denies fever or malaise. Husband just had colon cancer resected.   Acute OV 07/21/11 -- 78 yo woman, hx asthma that has been labile and affected by allergies, GERD. 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 Presents today noting . She was just treated with Augmentin starting 3/8, finished a pred taper from 3/1 back down to chronic 5mg  qd. She is on flovent, combivent prn, nebulized albuterol. Also fexofenedine and omeprazole.  She has had bouts of severe cough, UA noise, wheezing. No fevers, chills, sputum, CP. She is having PND. No GERD sx.  PHARYNGITIS, CHRONIC Flaring following apparent URI and resistant to rx with pred taper and also abx. Exacerbating factors are PND and GERD. She is taking allegra prn, omeprazole qd. Will try to ramp up rx of both factors to see if we can calm her throat down. - Start NSW qd - continue allegra - increase omeprazole to bid temporarily - continue pred 5 and finish  the augmentin - not clear that this is her asthma, sounds like all UA symptoms, although she confuses the two and calls it all asthma. It would be tempting to give her a break from Flovent to see if it would calm her throat down, since this is the driving problem at this time. She will continue for now. Asked her to use her nebs prn and not on a schedule.  - she  will call later this week to update on her progress ROV Dr Annamaria Boots in 3 -4 weeks  08/25/11- 81 yoF followed for asthma complicated by allergic rhinitis, bronchitis, GERD, hx of atypical AFB. Allergy vaccine- Dr Donneta Romberg. Hx brain tumor/ adrenal insufficiency. PCP Dr [name]  [name],fatigue deneis any wheezing, cough  .having  dizzy spells  more frequent. Had left knee replacement March 18 under spinal anesthesia. Wheezed immediately before surgery and was given a nebulizer treatment preop. Since then has done very well and says she feels well today. Did have lightheadedness or dizziness which was evaluated with EKG and CT scan. History of meningioma 11 years ago.  10/09/2011 Acute OV  Complains of cough with clear mucus, wheezing, DOE, decreased energy x1week  Barky cough,  Exposed to paint fumes and cough started.  No fever or discolored mucus.  No hemoptysis , no edema.   11/10/11- 22 yoF followed for asthma complicated by allergic rhinitis, bronchitis, GERD, hx of atypical AFB. Hx brain tumor/ adrenal insufficiency. PCP Dr [name] Was seen by a nurse practitioner in May and treated with Augmentin and prednisone increased to 10 mg. She is now back to 5 mg of prednisone daily. Staying indoors to avoid the weather. Short of breath since left hip replacement surgery in March. Occasional sneeze. She dropped off of Dr. Rush Landmark allergy vaccine. Cough produces scant phlegm. Denies chest pain. She says recent echocardiogram and EKG were "fine". Dyspnea on exertion. She just stopped to rest making her bed.  CXR- 07/15/11-  IMPRESSION:  Mild hyperinflation. No active disease.  Original Report Authenticated By: Raelyn Number, M.D.   03/14/12- 81 yoF followed for asthma complicated by allergic rhinitis, bronchitis, GERD, hx of atypical AFB. Hx brain tumor/ adrenal insufficiency. PCP Dr [name]    husband here FOLLOWS FOR: still having alot of cough-thick foamy light green in  color; afraid to take Avelox after reading about side effects because her right shoulder is still healing.  Acute illness started as a cold. Now deep rattle comes and goes, worse over the past 3 weeks but she sleeps well. Coughs out "clear foam". She remains on prednisone 5 mg daily.  04/29/12- 37 yoF followed for asthma complicated by allergic rhinitis, bronchitis, GERD, hx of atypical AFB. Hx brain tumor/ adrenal insufficiency. PCP Dr [name]    husband here ACUTE VISIT: went to Neuro MD yesterday for dizzy spells and unable to walk straight-told  to follow up here for Increased SOB-states she can tell her O2 gets low; feels weak and dizzy; not using Combivent-feels like it burns her throat and needs Flovent refilled She is worried she may have a recurrence of her brain tumor. At her primary physician's office yesterday for prednisone was increased to 15 mg twice daily for 3 days then 10 mg twice daily for 3 days then 5 mg twice daily x3 days, then back to her 5 mg daily maintenance. She says a chest x-ray was done "no pneumonia" CXR 03/14/12 IMPRESSION:  No active disease. Mild hyperinflation again  noted.  Original Report Authenticated By: Natasha Mead, M.D.   ROS-see HPI Constitutional:   No-   weight loss, night sweats, fevers, chills, fatigue, lassitude. HEENT:   No-  headaches, difficulty swallowing, tooth/dental problems, sore throat,       No-  sneezing, itching, ear ache, nasal congestion, post nasal drip,  CV:  No-   chest pain, orthopnea, PND, swelling in lower extremities, anasarca, dizziness, palpitations Resp: + shortness of breath with exertion or at rest.              +  productive cough,  No non-productive cough,  No- coughing up of blood.              No-   change in color of mucus.  + wheezing.   Skin: No-   rash or lesions. GI:  No-   heartburn, indigestion, abdominal pain, nausea, vomiting,  GU:  MS:  No-   joint pain or swelling.  . Neuro-     nothing unusual Psych:  No-  change in mood or affect. No depression or anxiety.  No memory loss.  OBJ- Physical Exam BP 122/80  Pulse 99  Ht 5' 3.5" (1.613 m)  Wt 129 lb 12.8 oz (58.877 kg)  BMI 22.63 kg/m2  SpO2 94% General- Alert, Oriented, Affect-appropriate, Distress- none acute. wheelchair Skin- rash-none, lesions- none, excoriation- none Lymphadenopathy- none Head- atraumatic            Eyes- Gross vision intact, PERRLA, conjunctivae and secretions clear            Ears- Hearing, canals-normal            Nose- Clear, no-Septal dev, mucus, polyps, erosion, perforation             Throat- Mallampati II , mucosa clear , drainage- none, tonsils- atrophic. Speech is normal. Neck- flexible , trachea midline, no stridor , thyroid nl, carotid no bruit Chest - symmetrical excursion , unlabored           Heart/CV- RRR , no murmur , no gallop  , no rub, nl s1 s2                           - JVD- none , edema- none, stasis changes- none, varices- none           Lung- + shallow tachypnea, cough+ with deep breath , dullness-none, rub- none           Chest wall-  Abd-  Br/ Gen/ Rectal- Not done, not indicated Extrem- cyanosis- none, clubbing, none, atrophy- none, strength- nl Neuro- grossly intact to observation

## 2012-05-09 ENCOUNTER — Telehealth: Payer: Self-pay | Admitting: Internal Medicine

## 2012-05-09 MED ORDER — AMOXICILLIN-POT CLAVULANATE 500-125 MG PO TABS: 1.0000 | ORAL_TABLET | Freq: Two times a day (BID) | ORAL | Status: DC

## 2012-05-09 MED ORDER — PREDNISONE 10 MG PO TABS: ORAL_TABLET | ORAL | Status: DC

## 2012-05-09 NOTE — Telephone Encounter (Signed)
Last ov w/ CY 12.20.13:  Patient Instructions     Script sent for albuterol HFA rescue albuterol inhaler to use up to 2 puffs, 4 times daily if needed, instead of Combivent.  Script sent for Flovent 110 refill  The prednisone schedule from Dr Lanell Matar office: Using 5 mg pills  3 tabs, twice daily , x 3 days  2 tabs, twice daily x 3 days  1 tab twice daily x 3 days  Then 1 tab daily to total one month  =================================================== Called spoke with pt's husband who reports pt had low grade temp yesterday, difficult sleeping d/t cough, "deep chest" non prod cough, increased SOB, some tightness in her chest.  Denies wheezing.  [facility] Allergies  Allergen Reactions  . Codeine Nausea And Vomiting  . Doxycycline     Very nauseated  . Restasis (Cyclosporine) Other (See Comments)    Eyes burn   . Ciprofloxacin Rash  . Phenytoin Rash  . Sulfonamide Derivatives Rash   Dr Maple Hudson please advise, thanks. Pt has upcoming ov 1.6.14.

## 2012-05-09 NOTE — Telephone Encounter (Signed)
Per CY: okay for augmentin 500mg  #14  1 po BID, 0 refills and prednisone 10mg  #20   40mg  x2 days, 30mg  x2 d, 20mg  x2 d, 10mg  x2 d and stop; 0 ref.  Called spoke with patient, advised of CY's recs as stated above.  Pt okay with this and verbalized her understanding.  Will call or seek emergency assistance if her symptoms do not improve or worsen.  Rx's telephoned to verified pharmacy; spoke with pharmacist Brad.  Nothing further needed at this time; will sign off.

## 2012-05-10 NOTE — Assessment & Plan Note (Signed)
Or anxiety and her complicating weakness and neurologic issues make this harder. We discussed alternatives and side effects decided to give prednisone taper. We will change her Combivent to pro air, hoping for better toleration

## 2012-05-16 ENCOUNTER — Ambulatory Visit (INDEPENDENT_AMBULATORY_CARE_PROVIDER_SITE_OTHER): Payer: Medicare Other | Admitting: Internal Medicine

## 2012-05-16 ENCOUNTER — Other Ambulatory Visit: Payer: Medicare Other

## 2012-05-16 ENCOUNTER — Encounter: Payer: Self-pay | Admitting: Internal Medicine

## 2012-05-16 VITALS — BP 122/76 | HR 81 | Ht 63.5 in | Wt 130.0 lb

## 2012-05-16 DIAGNOSIS — J42 Unspecified chronic bronchitis: Secondary | ICD-10-CM

## 2012-05-16 DIAGNOSIS — J45909 Unspecified asthma, uncomplicated: Secondary | ICD-10-CM

## 2012-05-16 MED ORDER — CLARITHROMYCIN 500 MG PO TABS: ORAL_TABLET | ORAL | Status: DC

## 2012-05-16 MED ORDER — HYDROCODONE-HOMATROPINE 5-1.5 MG/5ML PO SYRP: 5.0000 mL | ORAL_SOLUTION | Freq: Four times a day (QID) | ORAL | Status: DC | PRN

## 2012-05-16 NOTE — Progress Notes (Signed)
Patient ID: ALAZNE QUANT, female    DOB: April 26, 1929, 79 y.o.   MRN: [id number]  HPI 10/27/10- 81 yoF followed for asthma complicated by allergic rhinitis, bronchitis, GERD, hx of atypical AFB. Allergy vaccine- Dr Donneta Romberg. Last here -June 27, 2010- PFTs reviewed at that visit. Since then has done very well with no significant flares. She feels getting away from winter viruses was key.  She just had blepharoplasty.   02/18/11-  70 yoF followed for asthma complicated by allergic rhinitis, bronchitis, GERD, hx of atypical AFB. Allergy vaccine- Dr Donneta Romberg. She has gotten to the summer and early fall feeling very well. Really minimal chest tightness or nasal congestion. She has not been needing her nebulizer meds or rescue inhaler. We discussed the change in delivery system for Combivent. She will get one more of the current style to keep available. She has not been needing antihistamines or nasal spray.  04/27/11-  79 yoF followed for asthma complicated by allergic rhinitis, bronchitis, GERD, hx of atypical AFB. Allergy vaccine- Dr Donneta Romberg. Hx brain tumor/ adrenal insufficiency.  Husband here. Has had flu vaccine. Did well until 10 days to 2 weeks ago. She had overnight onset of cough but seemed to improve until last night when she again got acutely ill with cough, clear foamy sputum, decreased appetite, sore throat, muscle aches. Temperature at home today was 100. She continues prednisone 5 mg daily for adrenal insufficiency after treatment for brain tumor. Due for hip replacement in March.  05/15/11-  69 yoF followed for asthma complicated by allergic rhinitis, bronchitis, GERD, hx of atypical AFB. Allergy vaccine- Dr Donneta Romberg. Hx brain tumor/ adrenal insufficiency.  Husband is with her. She continues to wheeze with the exacerbation for which we saw her December 17. Sputum was cultured but is now foamy and white. She denies fever as she finishes amoxicillin. Sleeping on 3 pillows. Her nebulizer machine  made her too nervous. She had originally taken the Zithromax pack before amoxicillin. Doxycycline caused nausea and she had to stop. She has gone to 2 or 3 separate social functions that she felt she had to attend although she remains very tight in the chest with much cough and shortness of breath.  07/10/11-  62 yoF followed for asthma complicated by allergic rhinitis, bronchitis, GERD, hx of atypical AFB. Allergy vaccine- Dr Donneta Romberg. Hx brain tumor/ adrenal insufficiency.  Pending hip replacement March 18. Feeling mild chest and head congestion over the past 2 days with increased cough. Last night was wheezing. Has not needed her nebulizer machine and denies fever or malaise. Husband just had colon cancer resected.   Acute OV 07/21/11 -- 79 yo woman, hx asthma that has been labile and affected by allergies, GERD. 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 Presents today noting . She was just treated with Augmentin starting 3/8, finished a pred taper from 3/1 back down to chronic 5mg  qd. She is on flovent, combivent prn, nebulized albuterol. Also fexofenedine and omeprazole.  She has had bouts of severe cough, UA noise, wheezing. No fevers, chills, sputum, CP. She is having PND. No GERD sx.  PHARYNGITIS, CHRONIC Flaring following apparent URI and resistant to rx with pred taper and also abx. Exacerbating factors are PND and GERD. She is taking allegra prn, omeprazole qd. Will try to ramp up rx of both factors to see if we can calm her throat down. - Start NSW qd - continue allegra - increase omeprazole to bid temporarily - continue pred 5 and finish  the augmentin - not clear that this is her asthma, sounds like all UA symptoms, although she confuses the two and calls it all asthma. It would be tempting to give her a break from Flovent to see if it would calm her throat down, since this is the driving problem at this time. She will continue for now. Asked her to use her nebs prn and not on a schedule.  - she  will call later this week to update on her progress ROV Dr Annamaria Boots in 3 -4 weeks  08/25/11- 81 yoF followed for asthma complicated by allergic rhinitis, bronchitis, GERD, hx of atypical AFB. Allergy vaccine- Dr Donneta Romberg. Hx brain tumor/ adrenal insufficiency. PCP Dr [name]  [name],fatigue deneis any wheezing, cough  .having  dizzy spells  more frequent. Had left knee replacement March 18 under spinal anesthesia. Wheezed immediately before surgery and was given a nebulizer treatment preop. Since then has done very well and says she feels well today. Did have lightheadedness or dizziness which was evaluated with EKG and CT scan. History of meningioma 11 years ago.  10/09/2011 Acute OV  Complains of cough with clear mucus, wheezing, DOE, decreased energy x1week  Barky cough,  Exposed to paint fumes and cough started.  No fever or discolored mucus.  No hemoptysis , no edema.   11/10/11- 22 yoF followed for asthma complicated by allergic rhinitis, bronchitis, GERD, hx of atypical AFB. Hx brain tumor/ adrenal insufficiency. PCP Dr [name] Was seen by a nurse practitioner in May and treated with Augmentin and prednisone increased to 10 mg. She is now back to 5 mg of prednisone daily. Staying indoors to avoid the weather. Short of breath since left hip replacement surgery in March. Occasional sneeze. She dropped off of Dr. Rush Landmark allergy vaccine. Cough produces scant phlegm. Denies chest pain. She says recent echocardiogram and EKG were "fine". Dyspnea on exertion. She just stopped to rest making her bed.  CXR- 07/15/11-  IMPRESSION:  Mild hyperinflation. No active disease.  Original Report Authenticated By: Raelyn Number, M.D.   03/14/12- 81 yoF followed for asthma complicated by allergic rhinitis, bronchitis, GERD, hx of atypical AFB. Hx brain tumor/ adrenal insufficiency. PCP Dr [name]    husband here FOLLOWS FOR: still having alot of cough-thick foamy light green in  color; afraid to take Avelox after reading about side effects because her right shoulder is still healing.  Acute illness started as a cold. Now deep rattle comes and goes, worse over the past 3 weeks but she sleeps well. Coughs out "clear foam". She remains on prednisone 5 mg daily.  04/29/12- 37 yoF followed for asthma complicated by allergic rhinitis, bronchitis, GERD, hx of atypical AFB. Hx brain tumor/ adrenal insufficiency. PCP Dr [name]    husband here ACUTE VISIT: went to Neuro MD yesterday for dizzy spells and unable to walk straight-told  to follow up here for Increased SOB-states she can tell her O2 gets low; feels weak and dizzy; not using Combivent-feels like it burns her throat and needs Flovent refilled She is worried she may have a recurrence of her brain tumor. At her primary physician's office yesterday for prednisone was increased to 15 mg twice daily for 3 days then 10 mg twice daily for 3 days then 5 mg twice daily x3 days, then back to her 5 mg daily maintenance. She says a chest x-ray was done "no pneumonia" CXR 03/14/12 IMPRESSION:  No active disease. Mild hyperinflation again  noted.  Original Report Authenticated By: Natasha Mead, M.D.   05/16/12-  FOLLOWS FOR: pt states her breathing has gotten worse since last OV. reports that her breathing is good in the AM but after lunch is worsens. lots of coughing and wheezing.  Husband here "Bad December. " She describes coughing paroxysms and feels well in between. No effect of cough syrup except that it helps her sleep. Husband hears crackling sounds when she is lying down, breathing quietly. She was on a prednisone taper back to her maintenance 5 mg daily. Finished Augmentin last night. Using Combivent 4 times daily. Tussive soreness upper anterior chest wall. Scant productive white foamy sputum. No fever. Watery mucus from her nose triggered by coughing. She had 2 chest x-rays in November and one in December. One of them at her primary  physician. All described as clear CT chest 08/21/2011 from [facility] shows some mild emphysema and arterial calcifications but otherwise negative.  ROS-see HPI Constitutional:   No-   weight loss, night sweats, fevers, chills, fatigue, lassitude. HEENT:   No-  headaches, difficulty swallowing, tooth/dental problems, sore throat,       No-  sneezing, itching, ear ache, +nasal congestion, post nasal drip,  CV:  No-   chest pain, orthopnea, PND, swelling in lower extremities, anasarca, dizziness, palpitations Resp: + shortness of breath with exertion or at rest.              +  productive cough,  No non-productive cough,  No- coughing up of blood.              No-   change in color of mucus.  + wheezing.   Skin: No-   rash or lesions. GI:  No-   heartburn, indigestion, abdominal pain, nausea, vomiting,  GU:  MS:  No-   joint pain or swelling.  . Neuro-     nothing unusual Psych:  No- change in mood or affect. No depression or anxiety.  No memory loss.  OBJ- Physical Exam BP 122/76  Pulse 81  Ht 5' 3.5" (1.613 m)  Wt 130 lb (58.968 kg)  BMI 22.67 kg/m2  SpO2 94% General- Alert, Oriented, Affect-appropriate, Distress- none acute. wheelchair Skin- rash-none, lesions- none, excoriation- none Lymphadenopathy- none Head- atraumatic            Eyes- Gross vision intact, PERRLA, conjunctivae and secretions clear            Ears- Hearing, canals-normal            Nose- Clear, no-Septal dev, mucus, polyps, erosion, perforation             Throat- Mallampati II , mucosa clear , drainage- none, tonsils- atrophic. Speech is normal. Neck- flexible , trachea midline, +stridor- gets stridorous when anxious, thyroid nl, carotid no bruit Chest - symmetrical excursion , unlabored           Heart/CV- RRR , no murmur , no gallop  , no rub, nl s1 s2                           - JVD- none , edema- none, stasis changes- none, varices- none           Lung-  + raspy, vibratory cough , dullness-none, rub-  none           Chest wall-  Abd-  Br/ Gen/ Rectal- Not done, not indicated Extrem- cyanosis- none, clubbing, none,  atrophy- none, strength- nl Neuro- grossly intact to observation

## 2012-05-16 NOTE — Patient Instructions (Addendum)
Script for biaxin and cough syrup printed  Order- sputum culture for routine C&S and for AFB smear and culture  My staff will contact [facility] for report of any chest xrays and CT chest scans from this past year

## 2012-05-19 LAB — RESPIRATORY CULTURE OR RESPIRATORY AND SPUTUM CULTURE

## 2012-05-25 NOTE — Assessment & Plan Note (Signed)
Recurrent exacerbations. 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 We will look for atypical infection and try reducing any bronchitic component with Biaxin. I don't think giving her more bronchodilators will help.

## 2012-06-13 ENCOUNTER — Ambulatory Visit (INDEPENDENT_AMBULATORY_CARE_PROVIDER_SITE_OTHER): Payer: Medicare Other | Admitting: Internal Medicine

## 2012-06-13 ENCOUNTER — Encounter: Payer: Self-pay | Admitting: Internal Medicine

## 2012-06-13 VITALS — BP 120/76 | HR 90 | Ht 63.5 in | Wt 131.8 lb

## 2012-06-13 DIAGNOSIS — J309 Allergic rhinitis, unspecified: Secondary | ICD-10-CM

## 2012-06-13 DIAGNOSIS — J45909 Unspecified asthma, uncomplicated: Secondary | ICD-10-CM

## 2012-06-13 MED ORDER — AZELASTINE-FLUTICASONE 137-50 MCG/ACT NA SUSP: 1.0000 | Freq: Every day | NASAL | Status: DC

## 2012-06-13 NOTE — Patient Instructions (Addendum)
Sample Dymista    1-2 sprays each nostril every night at bedtime  You can also use Allegra as needed

## 2012-06-13 NOTE — Progress Notes (Signed)
[name], female    DOB: April 26, 1929, 79 y.o.   MRN: [id number]  HPI 10/27/10- 81 yoF followed for asthma complicated by allergic rhinitis, bronchitis, GERD, hx of atypical AFB. Allergy vaccine- Dr Donneta Romberg. Last here -June 27, 2010- PFTs reviewed at that visit. Since then has done very well with no significant flares. She feels getting away from winter viruses was key.  She just had blepharoplasty.   02/18/11-  70 yoF followed for asthma complicated by allergic rhinitis, bronchitis, GERD, hx of atypical AFB. Allergy vaccine- Dr Donneta Romberg. She has gotten to the summer and early fall feeling very well. Really minimal chest tightness or nasal congestion. She has not been needing her nebulizer meds or rescue inhaler. We discussed the change in delivery system for Combivent. She will get one more of the current style to keep available. She has not been needing antihistamines or nasal spray.  04/27/11-  79 yoF followed for asthma complicated by allergic rhinitis, bronchitis, GERD, hx of atypical AFB. Allergy vaccine- Dr Donneta Romberg. Hx brain tumor/ adrenal insufficiency.  Husband here. Has had flu vaccine. Did well until 10 days to 2 weeks ago. She had overnight onset of cough but seemed to improve until last night when she again got acutely ill with cough, clear foamy sputum, decreased appetite, sore throat, muscle aches. Temperature at home today was 100. She continues prednisone 5 mg daily for adrenal insufficiency after treatment for brain tumor. Due for hip replacement in March.  05/15/11-  69 yoF followed for asthma complicated by allergic rhinitis, bronchitis, GERD, hx of atypical AFB. Allergy vaccine- Dr Donneta Romberg. Hx brain tumor/ adrenal insufficiency.  Husband is with her. She continues to wheeze with the exacerbation for which we saw her December 17. Sputum was cultured but is now foamy and white. She denies fever as she finishes amoxicillin. Sleeping on 3 pillows. Her nebulizer machine  made her too nervous. She had originally taken the Zithromax pack before amoxicillin. Doxycycline caused nausea and she had to stop. She has gone to 2 or 3 separate social functions that she felt she had to attend although she remains very tight in the chest with much cough and shortness of breath.  07/10/11-  62 yoF followed for asthma complicated by allergic rhinitis, bronchitis, GERD, hx of atypical AFB. Allergy vaccine- Dr Donneta Romberg. Hx brain tumor/ adrenal insufficiency.  Pending hip replacement March 18. Feeling mild chest and head congestion over the past 2 days with increased cough. Last night was wheezing. Has not needed her nebulizer machine and denies fever or malaise. Husband just had colon cancer resected.   Acute OV 07/21/11 -- 79 yo woman, hx asthma that has been labile and affected by allergies, GERD. 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 Presents today noting . She was just treated with Augmentin starting 3/8, finished a pred taper from 3/1 back down to chronic 5mg  qd. She is on flovent, combivent prn, nebulized albuterol. Also fexofenedine and omeprazole.  She has had bouts of severe cough, UA noise, wheezing. No fevers, chills, sputum, CP. She is having PND. No GERD sx.  PHARYNGITIS, CHRONIC Flaring following apparent URI and resistant to rx with pred taper and also abx. Exacerbating factors are PND and GERD. She is taking allegra prn, omeprazole qd. Will try to ramp up rx of both factors to see if we can calm her throat down. - Start NSW qd - continue allegra - increase omeprazole to bid temporarily - continue pred 5 and finish  the augmentin - not clear that this is her asthma, sounds like all UA symptoms, although she confuses the two and calls it all asthma. It would be tempting to give her a break from Flovent to see if it would calm her throat down, since this is the driving problem at this time. She will continue for now. Asked her to use her nebs prn and not on a schedule.  - she  will call later this week to update on her progress ROV Dr Annamaria Boots in 3 -4 weeks  08/25/11- 81 yoF followed for asthma complicated by allergic rhinitis, bronchitis, GERD, hx of atypical AFB. Allergy vaccine- Dr Donneta Romberg. Hx brain tumor/ adrenal insufficiency. PCP Dr [name]  [name],fatigue deneis any wheezing, cough  .having  dizzy spells  more frequent. Had left knee replacement March 18 under spinal anesthesia. Wheezed immediately before surgery and was given a nebulizer treatment preop. Since then has done very well and says she feels well today. Did have lightheadedness or dizziness which was evaluated with EKG and CT scan. History of meningioma 11 years ago.  10/09/2011 Acute OV  Complains of cough with clear mucus, wheezing, DOE, decreased energy x1week  Barky cough,  Exposed to paint fumes and cough started.  No fever or discolored mucus.  No hemoptysis , no edema.   11/10/11- 22 yoF followed for asthma complicated by allergic rhinitis, bronchitis, GERD, hx of atypical AFB. Hx brain tumor/ adrenal insufficiency. PCP Dr [name] Was seen by a nurse practitioner in May and treated with Augmentin and prednisone increased to 10 mg. She is now back to 5 mg of prednisone daily. Staying indoors to avoid the weather. Short of breath since left hip replacement surgery in March. Occasional sneeze. She dropped off of Dr. Rush Landmark allergy vaccine. Cough produces scant phlegm. Denies chest pain. She says recent echocardiogram and EKG were "fine". Dyspnea on exertion. She just stopped to rest making her bed.  CXR- 07/15/11-  IMPRESSION:  Mild hyperinflation. No active disease.  Original Report Authenticated By: Raelyn Number, M.D.   03/14/12- 81 yoF followed for asthma complicated by allergic rhinitis, bronchitis, GERD, hx of atypical AFB. Hx brain tumor/ adrenal insufficiency. PCP Dr [name]    husband here FOLLOWS FOR: still having alot of cough-thick foamy light green in  color; afraid to take Avelox after reading about side effects because her right shoulder is still healing.  Acute illness started as a cold. Now deep rattle comes and goes, worse over the past 3 weeks but she sleeps well. Coughs out "clear foam". She remains on prednisone 5 mg daily.  04/29/12- 37 yoF followed for asthma complicated by allergic rhinitis, bronchitis, GERD, hx of atypical AFB. Hx brain tumor/ adrenal insufficiency. PCP Dr [name]    husband here ACUTE VISIT: went to Neuro MD yesterday for dizzy spells and unable to walk straight-told  to follow up here for Increased SOB-states she can tell her O2 gets low; feels weak and dizzy; not using Combivent-feels like it burns her throat and needs Flovent refilled She is worried she may have a recurrence of her brain tumor. At her primary physician's office yesterday for prednisone was increased to 15 mg twice daily for 3 days then 10 mg twice daily for 3 days then 5 mg twice daily x3 days, then back to her 5 mg daily maintenance. She says a chest x-ray was done "no pneumonia" CXR 03/14/12 IMPRESSION:  No active disease. Mild hyperinflation again  noted.  Original Report Authenticated By: Natasha Mead, M.D.   05/16/12-  FOLLOWS FOR: pt states her breathing has gotten worse since last OV. reports that her breathing is good in the AM but after lunch is worsens. lots of coughing and wheezing.  Husband here "Bad December. " She describes coughing paroxysms and feels well in between. No effect of cough syrup except that it helps her sleep. Husband hears crackling sounds when she is lying down, breathing quietly. She was on a prednisone taper back to her maintenance 5 mg daily. Finished Augmentin last night. Using Combivent 4 times daily. Tussive soreness upper anterior chest wall. Scant productive white foamy sputum. No fever. Watery mucus from her nose triggered by coughing. She had 2 chest x-rays in November and one in December. One of them at her primary  physician. All described as clear CT chest 08/21/2011 from [facility] shows some mild emphysema and arterial calcifications but otherwise negative.  06/13/12- 83 yoF followed for asthma complicated by allergic rhinitis, bronchitis, GERD, hx of atypical AFB. Hx brain tumor/ adrenal insufficiency. PCP Dr Jacky Kindle   FOLLOWS FOR: still has productive cough at times; wonders in allergies are starting to flare up again-sneezing. Sneezing and watery rhinorrhea which she attributes to "allergy". Dry cough is much improved compared with last visit. She finished her Biaxin 2 weeks ago. Continues prednisone 5 mg daily maintenance, plus her Flovent inhaler and uses rescue inhaler once or twice a day when needed. Sputum culture from 05/16/2012-normal flora. AFB smear negative.   ROS-see HPI Constitutional:   No-   weight loss, night sweats, fevers, chills, fatigue, lassitude. HEENT:   No-  headaches, difficulty swallowing, tooth/dental problems, sore throat,       No-  sneezing, itching, ear ache, +nasal congestion, +ispost nasal drip,  CV:  No-   chest pain, orthopnea, PND, swelling in lower extremities, anasarca, dizziness, palpitations Resp: + shortness of breath with exertion or at rest.              No- productive cough,  + non-productive cough,  No- coughing up of blood.              No-   change in color of mucus.  + wheezing.   Skin: No-   rash or lesions. GI:  No-   heartburn, indigestion, abdominal pain, nausea, vomiting,  GU:  MS:  No-   joint pain or swelling.  . Neuro-     nothing unusual Psych:  No- change in mood or affect. No depression or anxiety.  No memory loss.  OBJ- Physical Exam BP 120/76  Pulse 90  Ht 5' 3.5" (1.613 m)  Wt 131 lb 12.8 oz (59.784 kg)  BMI 22.98 kg/m2  SpO2 94% theseGeneral- Alert, Oriented, Affect-appropriate, Distress- none acute.  Skin- rash-none, lesions- none, excoriation- none Lymphadenopathy- none Head- atraumatic            Eyes- Gross vision intact,  PERRLA, conjunctivae and secretions clear            Ears- Hearing, canals-normal            Nose- Clear, no-Septal dev, mucus, polyps, erosion, perforation             Throat- Mallampati II , mucosa clear , drainage- none, tonsils- atrophic. Speech is normal. Neck- flexible , trachea midline, +stridor- gets stridorous when anxious, thyroid nl, carotid no bruit Chest - symmetrical excursion , unlabored  Heart/CV- RRR , no murmur , no gallop  , no rub, nl s1 s2                           - JVD- none , edema- none, stasis changes- none, varices- none           Lung-  + rattling cough , dullness-none, rub- none           Chest wall-  Abd-  Br/ Gen/ Rectal- Not done, not indicated Extrem- cyanosis- none, clubbing, none, atrophy- none, strength- nl Neuro- grossly intact to observation

## 2012-06-20 ENCOUNTER — Telehealth: Payer: Self-pay | Admitting: Internal Medicine

## 2012-06-20 MED ORDER — AMOXICILLIN-POT CLAVULANATE 875-125 MG PO TABS: 1.0000 | ORAL_TABLET | Freq: Two times a day (BID) | ORAL | Status: DC

## 2012-06-20 NOTE — Telephone Encounter (Signed)
Spoke with pt and notified of recs per CDY She verbalized understanding and states nothing further needed Rx was sent to pharm 

## 2012-06-20 NOTE — Telephone Encounter (Signed)
Per CY-okay to give Augmentin 875 mg #14 take 1 po bid no refills.  

## 2012-06-20 NOTE — Telephone Encounter (Signed)
I spoke with pt. She stated her cough is now deep and worse. She is coughing up green phlem and wheezing. No f/c/s/n/nasal congestion/pnd. She is using her albuterol inhaler/combivent and taking mucinex. She is also using dymista nasal spray. Please advise Dr. Maple Hudson thanks Last OV 06/13/12 Allergies  Allergen Reactions  . Codeine Nausea And Vomiting  . Doxycycline     Very nauseated  . Restasis (Cyclosporine) Other (See Comments)    Eyes burn   . Ciprofloxacin Rash  . Phenytoin Rash  . Sulfonamide Derivatives Rash

## 2012-06-20 NOTE — Telephone Encounter (Signed)
[id number] x1 

## 2012-06-20 NOTE — Telephone Encounter (Signed)
Pt returned call.  Call her back @ [phone]. Leanora Ivanoff

## 2012-06-22 NOTE — Assessment & Plan Note (Signed)
She is approaching her baseline, stable with steroid inhaler, low dose prednisone given more for her colitis. AFB culture is still out and might show a relapse of her atypical AFB. We will wait for the result.

## 2012-06-28 LAB — AFB CULTURE WITH SMEAR (NOT AT ARMC): Acid Fast Smear: NONE SEEN

## 2012-06-29 NOTE — Progress Notes (Signed)
Quick Note:  Pt aware of results. ______ 

## 2012-07-01 ENCOUNTER — Other Ambulatory Visit (HOSPITAL_COMMUNITY): Payer: Self-pay | Admitting: Urology

## 2012-07-04 ENCOUNTER — Telehealth: Payer: Self-pay | Admitting: Internal Medicine

## 2012-07-04 MED ORDER — AZELASTINE-FLUTICASONE 137-50 MCG/ACT NA SUSP: 1.0000 | Freq: Every day | NASAL | Status: DC

## 2012-07-04 NOTE — Telephone Encounter (Signed)
Rx has been sent in, pt is aware. 

## 2012-07-15 ENCOUNTER — Ambulatory Visit (HOSPITAL_COMMUNITY)
Admission: RE | Admit: 2012-07-15 | Discharge: 2012-07-15 | Disposition: A | Discharge status: Home / Self Care | Payer: Medicare Other | Source: Ambulatory Visit | Attending: Urology | Admitting: Urology

## 2012-07-15 DIAGNOSIS — Q619 Cystic kidney disease, unspecified: Secondary | ICD-10-CM | POA: Insufficient documentation

## 2012-07-15 DIAGNOSIS — K7689 Other specified diseases of liver: Secondary | ICD-10-CM | POA: Insufficient documentation

## 2012-07-15 DIAGNOSIS — D739 Disease of spleen, unspecified: Secondary | ICD-10-CM | POA: Insufficient documentation

## 2012-07-15 MED ORDER — GADOBENATE DIMEGLUMINE 529 MG/ML IV SOLN
10.0000 mL | Freq: Once | INTRAVENOUS | Status: AC | PRN
  Administered 2012-07-15: 10 mL via INTRAVENOUS

## 2012-08-09 ENCOUNTER — Encounter: Payer: Self-pay | Admitting: Internal Medicine

## 2012-08-09 ENCOUNTER — Ambulatory Visit (INDEPENDENT_AMBULATORY_CARE_PROVIDER_SITE_OTHER): Payer: Medicare Other | Admitting: Internal Medicine

## 2012-08-09 VITALS — BP 130/72 | HR 81 | Ht 64.0 in | Wt 133.4 lb

## 2012-08-09 DIAGNOSIS — J45909 Unspecified asthma, uncomplicated: Secondary | ICD-10-CM

## 2012-08-09 DIAGNOSIS — J300 Vasomotor rhinitis: Secondary | ICD-10-CM

## 2012-08-09 DIAGNOSIS — J312 Chronic pharyngitis: Secondary | ICD-10-CM

## 2012-08-09 DIAGNOSIS — J309 Allergic rhinitis, unspecified: Secondary | ICD-10-CM

## 2012-08-09 MED ORDER — AZELASTINE HCL 0.1 % NA SOLN: NASAL | Status: DC

## 2012-08-09 NOTE — Patient Instructions (Addendum)
Dymista is a combination steroid anti-inflammatory for maintenance use, and an antihistamine. You can try using it for stretches when needed.  If you find all you need is occasional extra help for runny nose, beyond what a simple antihistamine tablet can do, then try Astelin nasal spray. This is just an antihistamine, so you can use it off and on when needed to avoid over-drying.

## 2012-08-09 NOTE — Progress Notes (Signed)
[name], female    DOB: April 26, 1929, 79 y.o.   MRN: [id number]  HPI 10/27/10- 81 yoF followed for asthma complicated by allergic rhinitis, bronchitis, GERD, hx of atypical AFB. Allergy vaccine- Dr Donneta Romberg. Last here -June 27, 2010- PFTs reviewed at that visit. Since then has done very well with no significant flares. She feels getting away from winter viruses was key.  She just had blepharoplasty.   02/18/11-  70 yoF followed for asthma complicated by allergic rhinitis, bronchitis, GERD, hx of atypical AFB. Allergy vaccine- Dr Donneta Romberg. She has gotten to the summer and early fall feeling very well. Really minimal chest tightness or nasal congestion. She has not been needing her nebulizer meds or rescue inhaler. We discussed the change in delivery system for Combivent. She will get one more of the current style to keep available. She has not been needing antihistamines or nasal spray.  04/27/11-  79 yoF followed for asthma complicated by allergic rhinitis, bronchitis, GERD, hx of atypical AFB. Allergy vaccine- Dr Donneta Romberg. Hx brain tumor/ adrenal insufficiency.  Husband here. Has had flu vaccine. Did well until 10 days to 2 weeks ago. She had overnight onset of cough but seemed to improve until last night when she again got acutely ill with cough, clear foamy sputum, decreased appetite, sore throat, muscle aches. Temperature at home today was 100. She continues prednisone 5 mg daily for adrenal insufficiency after treatment for brain tumor. Due for hip replacement in March.  05/15/11-  69 yoF followed for asthma complicated by allergic rhinitis, bronchitis, GERD, hx of atypical AFB. Allergy vaccine- Dr Donneta Romberg. Hx brain tumor/ adrenal insufficiency.  Husband is with her. She continues to wheeze with the exacerbation for which we saw her December 17. Sputum was cultured but is now foamy and white. She denies fever as she finishes amoxicillin. Sleeping on 3 pillows. Her nebulizer machine  made her too nervous. She had originally taken the Zithromax pack before amoxicillin. Doxycycline caused nausea and she had to stop. She has gone to 2 or 3 separate social functions that she felt she had to attend although she remains very tight in the chest with much cough and shortness of breath.  07/10/11-  62 yoF followed for asthma complicated by allergic rhinitis, bronchitis, GERD, hx of atypical AFB. Allergy vaccine- Dr Donneta Romberg. Hx brain tumor/ adrenal insufficiency.  Pending hip replacement March 18. Feeling mild chest and head congestion over the past 2 days with increased cough. Last night was wheezing. Has not needed her nebulizer machine and denies fever or malaise. Husband just had colon cancer resected.   Acute OV 07/21/11 -- 79 yo woman, hx asthma that has been labile and affected by allergies, GERD. 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 Presents today noting . She was just treated with Augmentin starting 3/8, finished a pred taper from 3/1 back down to chronic 5mg  qd. She is on flovent, combivent prn, nebulized albuterol. Also fexofenedine and omeprazole.  She has had bouts of severe cough, UA noise, wheezing. No fevers, chills, sputum, CP. She is having PND. No GERD sx.  PHARYNGITIS, CHRONIC Flaring following apparent URI and resistant to rx with pred taper and also abx. Exacerbating factors are PND and GERD. She is taking allegra prn, omeprazole qd. Will try to ramp up rx of both factors to see if we can calm her throat down. - Start NSW qd - continue allegra - increase omeprazole to bid temporarily - continue pred 5 and finish  the augmentin - not clear that this is her asthma, sounds like all UA symptoms, although she confuses the two and calls it all asthma. It would be tempting to give her a break from Flovent to see if it would calm her throat down, since this is the driving problem at this time. She will continue for now. Asked her to use her nebs prn and not on a schedule.  - she  will call later this week to update on her progress ROV Dr Annamaria Boots in 3 -4 weeks  08/25/11- 81 yoF followed for asthma complicated by allergic rhinitis, bronchitis, GERD, hx of atypical AFB. Allergy vaccine- Dr Donneta Romberg. Hx brain tumor/ adrenal insufficiency. PCP Dr [name]  [name],fatigue deneis any wheezing, cough  .having  dizzy spells  more frequent. Had left knee replacement March 18 under spinal anesthesia. Wheezed immediately before surgery and was given a nebulizer treatment preop. Since then has done very well and says she feels well today. Did have lightheadedness or dizziness which was evaluated with EKG and CT scan. History of meningioma 11 years ago.  10/09/2011 Acute OV  Complains of cough with clear mucus, wheezing, DOE, decreased energy x1week  Barky cough,  Exposed to paint fumes and cough started.  No fever or discolored mucus.  No hemoptysis , no edema.   11/10/11- 22 yoF followed for asthma complicated by allergic rhinitis, bronchitis, GERD, hx of atypical AFB. Hx brain tumor/ adrenal insufficiency. PCP Dr [name] Was seen by a nurse practitioner in May and treated with Augmentin and prednisone increased to 10 mg. She is now back to 5 mg of prednisone daily. Staying indoors to avoid the weather. Short of breath since left hip replacement surgery in March. Occasional sneeze. She dropped off of Dr. Rush Landmark allergy vaccine. Cough produces scant phlegm. Denies chest pain. She says recent echocardiogram and EKG were "fine". Dyspnea on exertion. She just stopped to rest making her bed.  CXR- 07/15/11-  IMPRESSION:  Mild hyperinflation. No active disease.  Original Report Authenticated By: Raelyn Number, M.D.   03/14/12- 81 yoF followed for asthma complicated by allergic rhinitis, bronchitis, GERD, hx of atypical AFB. Hx brain tumor/ adrenal insufficiency. PCP Dr [name]    husband here FOLLOWS FOR: still having alot of cough-thick foamy light green in  color; afraid to take Avelox after reading about side effects because her right shoulder is still healing.  Acute illness started as a cold. Now deep rattle comes and goes, worse over the past 3 weeks but she sleeps well. Coughs out "clear foam". She remains on prednisone 5 mg daily.  04/29/12- 37 yoF followed for asthma complicated by allergic rhinitis, bronchitis, GERD, hx of atypical AFB. Hx brain tumor/ adrenal insufficiency. PCP Dr [name]    husband here ACUTE VISIT: went to Neuro MD yesterday for dizzy spells and unable to walk straight-told  to follow up here for Increased SOB-states she can tell her O2 gets low; feels weak and dizzy; not using Combivent-feels like it burns her throat and needs Flovent refilled She is worried she may have a recurrence of her brain tumor. At her primary physician's office yesterday for prednisone was increased to 15 mg twice daily for 3 days then 10 mg twice daily for 3 days then 5 mg twice daily x3 days, then back to her 5 mg daily maintenance. She says a chest x-ray was done "no pneumonia" CXR 03/14/12 IMPRESSION:  No active disease. Mild hyperinflation again  noted.  Original Report Authenticated By: Natasha Mead, M.D.   05/16/12-  FOLLOWS FOR: pt states her breathing has gotten worse since last OV. reports that her breathing is good in the AM but after lunch is worsens. lots of coughing and wheezing.  Husband here "Bad December. " She describes coughing paroxysms and feels well in between. No effect of cough syrup except that it helps her sleep. Husband hears crackling sounds when she is lying down, breathing quietly. She was on a prednisone taper back to her maintenance 5 mg daily. Finished Augmentin last night. Using Combivent 4 times daily. Tussive soreness upper anterior chest wall. Scant productive white foamy sputum. No fever. Watery mucus from her nose triggered by coughing. She had 2 chest x-rays in November and one in December. One of them at her primary  physician. All described as clear CT chest 08/21/2011 from [facility] shows some mild emphysema and arterial calcifications but otherwise negative.  06/13/12- 83 yoF followed for asthma complicated by allergic rhinitis, bronchitis, GERD, hx of atypical AFB. Hx brain tumor/ adrenal insufficiency. PCP Dr Jacky Kindle   FOLLOWS FOR: still has productive cough at times; wonders in allergies are starting to flare up again-sneezing. Sneezing and watery rhinorrhea which she attributes to "allergy". Dry cough is much improved compared with last visit. She finished her Biaxin 2 weeks ago. Continues prednisone 5 mg daily maintenance, plus her Flovent inhaler and uses rescue inhaler once or twice a day when needed. Sputum culture from 05/16/2012-normal flora. AFB smear negative.  08/09/12- 83 yoF followed for asthma complicated by allergic rhinitis, bronchitis, GERD, hx of atypical AFB. Hx brain tumor/ adrenal insufficiency. PCP Dr Jacky Kindle  FOLLOWS JWJ:[id number] any SOB, wheezing, cough, or congestion today. Would like to discuss Dymista(has been using as needed)-noticed it caused dry mouth before. Feels as comfortable with her breathing now if she ever gets. Occasional use of rescue inhaler but no significant cough or wheeze recently.  ROS-see HPI Constitutional:   No-   weight loss, night sweats, fevers, chills, fatigue, lassitude. HEENT:   No-  headaches, difficulty swallowing, tooth/dental problems, sore throat,       No-  sneezing, itching, ear ache, +nasal congestion, no-post nasal drip,  CV:  No-   chest pain, orthopnea, PND, swelling in lower extremities, anasarca, dizziness, palpitations Resp: + improved shortness of breath with exertion or at rest.              No- productive cough, no- non-productive cough,  No- coughing up of blood.              No-   change in color of mucus.  No- wheezing.   Skin: No-   rash or lesions. GI:  No-   heartburn, indigestion, abdominal pain, nausea, vomiting,  GU:  MS:   No-   joint pain or swelling.  . Neuro-     nothing unusual Psych:  No- change in mood or affect. No depression or anxiety.  No memory loss.  OBJ- Physical Exam  General- Alert, Oriented, Affect-appropriate, Distress- none acute.  Skin- rash-none, lesions- none, excoriation- none Lymphadenopathy- none Head- atraumatic            Eyes- Gross vision intact, PERRLA, conjunctivae and secretions clear            Ears- Hearing, canals-normal            Nose- Clear, no-Septal dev, mucus, polyps, erosion, perforation  Throat- Mallampati II , mucosa clear , drainage- none, tonsils- atrophic. A little hoarse with throat clearing. Neck- flexible , trachea midline, +stridor- gets stridorous when anxious, thyroid nl, carotid no bruit Chest - symmetrical excursion , unlabored           Heart/CV- RRR , no murmur , no gallop  , no rub, nl s1 s2                           - JVD- none , edema- none, stasis changes- none, varices- none           Lung-  Clear, no cough or wheeze , dullness-none, rub- none           Chest wall-  Abd-  Br/ Gen/ Rectal- Not done, not indicated Extrem- cyanosis- none, clubbing, none, atrophy- none, strength- nl Neuro- grossly intact to observation

## 2012-08-17 NOTE — Assessment & Plan Note (Signed)
I don't see thrush or postnasal drip. Educated again about possible reflux.

## 2012-08-17 NOTE — Assessment & Plan Note (Signed)
Doing quite well currently. We discussed expectations through the spring pollen season. Continue present meds.

## 2012-08-17 NOTE — Assessment & Plan Note (Signed)
Plan-we discussed use of Dymista and we'll try another sample.

## 2012-10-14 ENCOUNTER — Telehealth: Payer: Self-pay | Admitting: Internal Medicine

## 2012-10-14 ENCOUNTER — Encounter: Payer: Self-pay | Admitting: Internal Medicine

## 2012-10-14 ENCOUNTER — Ambulatory Visit (INDEPENDENT_AMBULATORY_CARE_PROVIDER_SITE_OTHER): Payer: Medicare Other | Admitting: Internal Medicine

## 2012-10-14 VITALS — BP 98/62 | HR 95 | Temp 96.9°F | Ht 64.0 in | Wt 130.8 lb

## 2012-10-14 DIAGNOSIS — J4541 Moderate persistent asthma with (acute) exacerbation: Secondary | ICD-10-CM

## 2012-10-14 DIAGNOSIS — J209 Acute bronchitis, unspecified: Secondary | ICD-10-CM

## 2012-10-14 DIAGNOSIS — J45901 Unspecified asthma with (acute) exacerbation: Secondary | ICD-10-CM

## 2012-10-14 MED ORDER — LEVALBUTEROL HCL 0.63 MG/3ML IN NEBU
0.6300 mg | INHALATION_SOLUTION | Freq: Once | RESPIRATORY_TRACT | Status: AC
  Administered 2012-10-14: 0.63 mg via RESPIRATORY_TRACT

## 2012-10-14 MED ORDER — AMOXICILLIN-POT CLAVULANATE 875-125 MG PO TABS: 1.0000 | ORAL_TABLET | Freq: Two times a day (BID) | ORAL | Status: DC

## 2012-10-14 MED ORDER — METHYLPREDNISOLONE ACETATE 80 MG/ML IJ SUSP
80.0000 mg | Freq: Once | INTRAMUSCULAR | Status: AC
  Administered 2012-10-14: 80 mg via INTRAMUSCULAR

## 2012-10-14 MED ORDER — PREDNISONE 10 MG PO TABS: ORAL_TABLET | ORAL | Status: DC

## 2012-10-14 NOTE — Telephone Encounter (Signed)
Spoke with patients spouse--  Pt c/o SOB when moving around, chest tightness, Dizzy, 100 degree fever, prod cough w yellow green sputum , nauseous and increased use of nebulizer x 2-3 days Currently taking trimethoprim abx for cystitis. Patient requesting to be seen today. Dr. Maple Hudson please advise if patient can be worked into your schedule or another provider as Dr. Sherene Sires and Dr. Vassie Loll have openings available. Thank You  Last OV 08/09/12 Next OV 02/09/13

## 2012-10-14 NOTE — Patient Instructions (Addendum)
Neb xop 0.63  Depo 80  Script sent for prednisone taper. When this ends, go back to your 5 mg daily maintenance.   Notice if the higher dose affects your dizziness.  Script sent for augmentin antibitotic  Drink enough fluids

## 2012-10-14 NOTE — Telephone Encounter (Signed)
Per CY - pt needs to come in for OV.  She is aware of this. Appointment has been made for 11:30am today. Nothing further was needed.

## 2012-10-14 NOTE — Progress Notes (Signed)
[name], female    DOB: April 26, 1929, 79 y.o.   MRN: [id number]  HPI 10/27/10- 81 yoF followed for asthma complicated by allergic rhinitis, bronchitis, GERD, hx of atypical AFB. Allergy vaccine- Dr Sierra [name]. Last here -June 27, 2010- PFTs reviewed at that visit. Since then has done very well with no significant flares. She feels getting away from winter viruses was key.  She just had blepharoplasty.   02/18/11-  70 yoF followed for asthma complicated by allergic rhinitis, bronchitis, GERD, hx of atypical AFB. Allergy vaccine- Dr Sierra [name]. She has gotten to the summer and early fall feeling very well. Really minimal chest tightness or nasal congestion. She has not been needing her nebulizer meds or rescue inhaler. We discussed the change in delivery system for Combivent. She will get one more of the current style to keep available. She has not been needing antihistamines or nasal spray.  04/27/11-  79 yoF followed for asthma complicated by allergic rhinitis, bronchitis, GERD, hx of atypical AFB. Allergy vaccine- Dr Sierra [name]. Hx brain tumor/ adrenal insufficiency.  Husband here. Has had flu vaccine. Did well until 10 days to 2 weeks ago. She had overnight onset of cough but seemed to improve until last night when she again got acutely ill with cough, clear foamy sputum, decreased appetite, sore throat, muscle aches. Temperature at home today was 100. She continues prednisone 5 mg daily for adrenal insufficiency after treatment for brain tumor. Due for hip replacement in March.  05/15/11-  69 yoF followed for asthma complicated by allergic rhinitis, bronchitis, GERD, hx of atypical AFB. Allergy vaccine- Dr Sierra [name]. Hx brain tumor/ adrenal insufficiency.  Husband is with her. She continues to wheeze with the exacerbation for which we saw her December 17. Sputum was cultured but is now foamy and white. She denies fever as she finishes amoxicillin. Sleeping on 3 pillows. Her nebulizer machine  made her too nervous. She had originally taken the Zithromax pack before amoxicillin. Doxycycline caused nausea and she had to stop. She has gone to 2 or 3 separate social functions that she felt she had to attend although she remains very tight in the chest with much cough and shortness of breath.  07/10/11-  62 yoF followed for asthma complicated by allergic rhinitis, bronchitis, GERD, hx of atypical AFB. Allergy vaccine- Dr Sierra [name]. Hx brain tumor/ adrenal insufficiency.  Pending hip replacement March 18. Feeling mild chest and head congestion over the past 2 days with increased cough. Last night was wheezing. Has not needed her nebulizer machine and denies fever or malaise. Husband just had colon cancer resected.   Acute OV 07/21/11 -- 79 yo woman, hx asthma that has been labile and affected by allergies, GERD. 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 Presents today noting . She was just treated with Augmentin starting 3/8, finished a pred taper from 3/1 back down to chronic 5mg  qd. She is on flovent, combivent prn, nebulized albuterol. Also fexofenedine and omeprazole.  She has had bouts of severe cough, UA noise, wheezing. No fevers, chills, sputum, CP. She is having PND. No GERD sx.  PHARYNGITIS, CHRONIC Flaring following apparent URI and resistant to rx with pred taper and also abx. Exacerbating factors are PND and GERD. She is taking allegra prn, omeprazole qd. Will try to ramp up rx of both factors to see if we can calm her throat down. - Start [place] qd - continue allegra - increase omeprazole to bid temporarily - continue pred 5 and finish  the augmentin - not clear that this is her asthma, sounds like all UA symptoms, although she confuses the two and calls it all asthma. It would be tempting to give her a break from Flovent to see if it would calm her throat down, since this is the driving problem at this time. She will continue for now. Asked her to use her nebs prn and not on a schedule.  - she  will call later this week to update on her progress ROV Dr Sierra [name] in 3 -4 weeks  08/25/11- 81 yoF followed for asthma complicated by allergic rhinitis, bronchitis, GERD, hx of atypical AFB. Allergy vaccine- Dr Sierra [name]. Hx brain tumor/ adrenal insufficiency. PCP Dr Sierra [name]  [name],fatigue deneis any wheezing, cough  .having  dizzy spells  more frequent. Had left knee replacement March 18 under spinal anesthesia. Wheezed immediately before surgery and was given a nebulizer treatment preop. Since then has done very well and says she feels well today. Did have lightheadedness or dizziness which was evaluated with EKG and CT scan. History of meningioma 11 years ago.  10/09/2011 Acute OV  Complains of cough with clear mucus, wheezing, DOE, decreased energy x1week  Barky cough,  Exposed to paint fumes and cough started.  No fever or discolored mucus.  No hemoptysis , no edema.   11/10/11- 22 yoF followed for asthma complicated by allergic rhinitis, bronchitis, GERD, hx of atypical AFB. Hx brain tumor/ adrenal insufficiency. PCP Dr Sierra [name] Was seen by a nurse practitioner in May and treated with Augmentin and prednisone increased to 10 mg. She is now back to 5 mg of prednisone daily. Staying indoors to avoid the weather. Short of breath since left hip replacement surgery in March. Occasional sneeze. She dropped off of Dr. Rush [name] allergy vaccine. Cough produces scant phlegm. Denies chest pain. She says recent echocardiogram and EKG were "fine". Dyspnea on exertion. She just stopped to rest making her bed.  CXR- 07/15/11-  IMPRESSION:  Mild hyperinflation. No active disease.  Original Report Authenticated By: Sierra [name], M.D.   03/14/12- 81 yoF followed for asthma complicated by allergic rhinitis, bronchitis, GERD, hx of atypical AFB. Hx brain tumor/ adrenal insufficiency. PCP Dr Sierra [name]    husband here FOLLOWS FOR: still having alot of cough-thick foamy light green in  color; afraid to take Avelox after reading about side effects because her right shoulder is still healing.  Acute illness started as a cold. Now deep rattle comes and goes, worse over the past 3 weeks but she sleeps well. Coughs out "clear foam". She remains on prednisone 5 mg daily.  04/29/12- 37 yoF followed for asthma complicated by allergic rhinitis, bronchitis, GERD, hx of atypical AFB. Hx brain tumor/ adrenal insufficiency. PCP Dr Sierra [name]    husband here ACUTE VISIT: went to Neuro MD yesterday for dizzy spells and unable to walk straight-told  to follow up here for Increased SOB-states she can tell her O2 gets low; feels weak and dizzy; not using Combivent-feels like it burns her throat and needs Flovent refilled She is worried she may have a recurrence of her brain tumor. At her primary physician's office yesterday for prednisone was increased to 15 mg twice daily for 3 days then 10 mg twice daily for 3 days then 5 mg twice daily x3 days, then back to her 5 mg daily maintenance. She says a chest x-ray was done "no pneumonia" CXR 03/14/12 IMPRESSION:  No active disease. Mild hyperinflation again  noted.  Original Report Authenticated By: Natasha Mead, M.D.   05/16/12-  FOLLOWS FOR: pt states her breathing has gotten worse since last OV. reports that her breathing is good in the AM but after lunch is worsens. lots of coughing and wheezing.  Husband here "Bad December. " She describes coughing paroxysms and feels well in between. No effect of cough syrup except that it helps her sleep. Husband hears crackling sounds when she is lying down, breathing quietly. She was on a prednisone taper back to her maintenance 5 mg daily. Finished Augmentin last night. Using Combivent 4 times daily. Tussive soreness upper anterior chest wall. Scant productive white foamy sputum. No fever. Watery mucus from her nose triggered by coughing. She had 2 chest x-rays in November and one in December. One of them at her primary  physician. All described as clear CT chest 08/21/2011 from [facility] shows some mild emphysema and arterial calcifications but otherwise negative.  06/13/12- 83 yoF followed for asthma complicated by allergic rhinitis, bronchitis, GERD, hx of atypical AFB. Hx brain tumor/ adrenal insufficiency. PCP Dr Jacky Kindle   FOLLOWS FOR: still has productive cough at times; wonders in allergies are starting to flare up again-sneezing. Sneezing and watery rhinorrhea which she attributes to "allergy". Dry cough is much improved compared with last visit. She finished her Biaxin 2 weeks ago. Continues prednisone 5 mg daily maintenance, plus her Flovent inhaler and uses rescue inhaler once or twice a day when needed. Sputum culture from 05/16/2012-normal flora. AFB smear negative.  08/09/12- 83 yoF followed for asthma complicated by allergic rhinitis, bronchitis, GERD, hx of atypical AFB. Hx brain tumor/ adrenal insufficiency. PCP Dr Jacky Kindle  FOLLOWS ZOX:WRUEAV any SOB, wheezing, cough, or congestion today. Would like to discuss Dymista(has been using as needed)-noticed it caused dry mouth before. Feels as comfortable with her breathing now if she ever gets. Occasional use of rescue inhaler but no significant cough or wheeze recently.  10/14/12- 3 yoF followed for asthma complicated by allergic rhinitis, bronchitis, GERD, hx of atypical AFB. Hx brain tumor/ adrenal insufficiency. PCP Dr Jacky Kindle   Husband here ACUTE VISIT: Started feeling bad Wednesday last week; Thursday started wheezing, cough, SOB, fevers of 100's, not wanting to eat-nothing to eat today. She had done too much in yard work for 2 days. For the past 8 or 10 days has felt acutely ill with cough especially bad for the past 2 days. Deep cough, exhausted. Nebulizer does not help. Dr Jacky Kindle also wanted to see if increasing maintenance prednisone would help her chronic dizziness.  ROS-see HPI Constitutional:   No-   weight loss, night sweats, +fevers,  chills, fatigue, lassitude. HEENT:   No-  headaches, difficulty swallowing, tooth/dental problems, sore throat,       No-  sneezing, itching, ear ache, +nasal congestion, no-post nasal drip,  CV:  No-   chest pain, orthopnea, PND, swelling in lower extremities, anasarca, dizziness, palpitations Resp: + improved shortness of breath with exertion or at rest.              + productive cough, no- non-productive cough,  No- coughing up of blood.              No-   change in color of mucus.  No- wheezing.   Skin: No-   rash or lesions. GI:  No-   heartburn, indigestion, abdominal pain, nausea, vomiting,  GU:  MS:  No-   joint pain or swelling.  . Neuro-  nothing unusual Psych:  No- change in mood or affect. No depression or anxiety.  No memory loss.  OBJ- Physical Exam BP 98/62  Pulse 95  Temp(Src) 96.9 F (36.1 C) (Oral)  Ht 5\' 4"  (1.626 m)  Wt 130 lb 12.8 oz (59.33 kg)  BMI 22.44 kg/m2  SpO2 96% General- Alert, Oriented, Affect-appropriate, Distress- none acute.  Skin- rash-none, lesions- none, excoriation- none Lymphadenopathy- none Head- atraumatic            Eyes- Gross vision intact, PERRLA, conjunctivae and secretions clear            Ears- Hearing, canals-normal            Nose- Clear, no-Septal dev, mucus, polyps, erosion, perforation             Throat- Mallampati II , mucosa clear , drainage- none, tonsils- atrophic. A little hoarse with throat clearing. Neck- flexible , trachea midline, stridor-none, thyroid nl, carotid no bruit Chest - symmetrical excursion , unlabored           Heart/CV- RRR , no murmur , no gallop  , no rub, nl s1 s2                           - JVD- none , edema- none, stasis changes- none, varices- none           Lung-  +deep cough with some wheeze , dullness-none, rub- none           Chest wall-  Abd-  Br/ Gen/ Rectal- Not done, not indicated Extrem- cyanosis- none, clubbing, none, atrophy- none, strength- nl Neuro- grossly intact to  observation

## 2012-10-18 ENCOUNTER — Telehealth: Payer: Self-pay | Admitting: Internal Medicine

## 2012-10-18 MED ORDER — PREDNISONE 10 MG PO TABS: ORAL_TABLET | ORAL | Status: DC

## 2012-10-18 NOTE — Telephone Encounter (Signed)
Suggest prednisone daily until this breaks, then start tapering again as before.

## 2012-10-18 NOTE — Telephone Encounter (Signed)
Spoke with patient-states she was seen 10-14-12 and felt better on Saturday and Sunday but then had an attack-since then she feels like she cant breathe and wheezing; currently on Augmentin and Prednisone taper. Would like recs from CY.

## 2012-10-18 NOTE — Telephone Encounter (Signed)
Pt aware to start 60 mg Prednisone taper again; will continue neb txs as needed as well.

## 2012-10-18 NOTE — Telephone Encounter (Signed)
Spoke with Dr Maple Hudson: [facility] for prednisone 10mg  #75, 60mg  daily or as directed.    Called spoke with patient, advised of CY's recs as above and informed her to taper her prednisone as previously recommended by CY.  Pt verbalized her understanding and denied any further questions/concerns at this time.  Rx sent to verified pharmacy.  Nothing further needed; will sign off.

## 2012-10-29 NOTE — Assessment & Plan Note (Signed)
Acute exacerbations tend to be intense and persistent, often without obvious cause. We watch for reflux and viral infections. This time she indicates it came on because she had spent much time working out in her garden

## 2012-12-18 ENCOUNTER — Ambulatory Visit (INDEPENDENT_AMBULATORY_CARE_PROVIDER_SITE_OTHER): Payer: Medicare Other | Admitting: Emergency Medicine

## 2012-12-18 ENCOUNTER — Other Ambulatory Visit: Payer: Self-pay | Admitting: *Deleted

## 2012-12-18 ENCOUNTER — Encounter (HOSPITAL_COMMUNITY): Payer: Self-pay

## 2012-12-18 ENCOUNTER — Ambulatory Visit (HOSPITAL_COMMUNITY)
Admission: RE | Admit: 2012-12-18 | Discharge: 2012-12-18 | Disposition: A | Discharge status: Home / Self Care | Payer: Medicare Other | Source: Ambulatory Visit | Attending: Emergency Medicine | Admitting: Emergency Medicine

## 2012-12-18 VITALS — BP 110/68 | HR 90 | Temp 98.0°F | Resp 17 | Ht 64.5 in | Wt 133.0 lb

## 2012-12-18 DIAGNOSIS — K1120 Sialoadenitis, unspecified: Secondary | ICD-10-CM | POA: Insufficient documentation

## 2012-12-18 DIAGNOSIS — K115 Sialolithiasis: Secondary | ICD-10-CM | POA: Insufficient documentation

## 2012-12-18 DIAGNOSIS — K111 Hypertrophy of salivary gland: Secondary | ICD-10-CM | POA: Insufficient documentation

## 2012-12-18 DIAGNOSIS — R599 Enlarged lymph nodes, unspecified: Secondary | ICD-10-CM

## 2012-12-18 DIAGNOSIS — M4802 Spinal stenosis, cervical region: Secondary | ICD-10-CM | POA: Insufficient documentation

## 2012-12-18 DIAGNOSIS — E079 Disorder of thyroid, unspecified: Secondary | ICD-10-CM | POA: Insufficient documentation

## 2012-12-18 DIAGNOSIS — M5124 Other intervertebral disc displacement, thoracic region: Secondary | ICD-10-CM | POA: Insufficient documentation

## 2012-12-18 LAB — POCT CBC
Granulocyte percent: 82.9 %G — AB (ref 37–80)
MCV: 89 fL (ref 80–97)
MID (cbc): 0.9 (ref 0–0.9)
Platelet Count, POC: 354 10*3/uL (ref 142–424)
RBC: 5.01 M/uL (ref 4.04–5.48)

## 2012-12-18 LAB — CREATININE, SERUM
Creatinine, Ser: 0.62 mg/dL (ref 0.50–1.10)
GFR calc Af Amer: >90 mL/min (ref >90)

## 2012-12-18 LAB — POCT RAPID STREP A (OFFICE): Rapid Strep A Screen: NEGATIVE

## 2012-12-18 LAB — BUN: BUN: 14 mg/dL (ref 6–23)

## 2012-12-18 MED ORDER — ONDANSETRON 4 MG PO TBDP: 4.0000 mg | ORAL_TABLET | Freq: Three times a day (TID) | ORAL | Status: DC | PRN

## 2012-12-18 MED ORDER — PREDNISONE 10 MG PO TABS: ORAL_TABLET | ORAL | Status: DC

## 2012-12-18 MED ORDER — AMOXICILLIN-POT CLAVULANATE 875-125 MG PO TABS: 1.0000 | ORAL_TABLET | Freq: Two times a day (BID) | ORAL | Status: DC

## 2012-12-18 MED ORDER — IOHEXOL 300 MG/ML  SOLN
100.0000 mL | Freq: Once | INTRAMUSCULAR | Status: AC | PRN
  Administered 2012-12-18: 100 mL via INTRAVENOUS

## 2012-12-18 MED ORDER — AMOXICILLIN 875 MG PO TABS: 875.0000 mg | ORAL_TABLET | Freq: Two times a day (BID) | ORAL | Status: DC

## 2012-12-18 NOTE — Progress Notes (Signed)
  Subjective:    [name], female    DOB: [birth year], 79 y.o.   MRN: [id number]  HPI  79 year old female here with swollen gland in the left side of neck.  Having difficulty swallowing.  Put some topical anesthetic on it.  Thinks she has an infection in the area.  Started on Thursday and thought it was an ulcer.      Review of Systems     Objective:   Physical Exam the left submaxillary gland is enlarged tender and swollen. It feels to be about 2 x 3 cm and it is exquisitely tender. The duct to the submaxillary gland beneath the tongue is slightly red. the teeth are normal on that side and not tender to palpation. TM clear, throat normal.  Results for orders placed in visit on 12/18/12  POCT CBC      Result Value Range   WBC 15.8 (*) 4.6 - 10.2 K/uL   Lymph, poc 1.8  0.6 - 3.4   POC LYMPH PERCENT 11.3  10 - 50 %L   MID (cbc) 0.9  0 - 0.9   POC MID % 5.8  0 - 12 %M   POC Granulocyte 13.1 (*) 2 - 6.9   Granulocyte percent 82.9 (*) 37 - 80 %G   RBC 5.01  4.04 - 5.48 M/uL   Hemoglobin 14.3  12.2 - 16.2 g/dL   HCT, POC 40.9  81.1 - 47.9 %   MCV 89.0  80 - 97 fL   MCH, POC 28.5  27 - 31.2 pg   MCHC 32.1  31.8 - 35.4 g/dL   RDW, POC 91.4     Platelet Count, POC 354  142 - 424 K/uL   MPV 7.6  0 - 99.8 fL  POCT RAPID STREP A (OFFICE)      Result Value Range   Rapid Strep A Screen Negative  Negative        Assessment & Plan:  We'll treat with Augmentin and ibuprofen. she is to apply ice to the area. urgent referral made to Dr. Lazarus Salines for his evaluation. CT scan ordered to evaluate the area since her white count is  Elevated .

## 2013-01-05 ENCOUNTER — Encounter (HOSPITAL_COMMUNITY)
Admission: EM | Disposition: A | Discharge status: Home / Self Care | Payer: Self-pay | Source: Home / Self Care | Attending: Emergency Medicine

## 2013-01-05 ENCOUNTER — Observation Stay (HOSPITAL_COMMUNITY)
Admission: EM | Admit: 2013-01-05 | Discharge: 2013-01-09 | Disposition: A | Discharge status: Home / Self Care | Payer: Medicare Other | Attending: Internal Medicine | Admitting: Internal Medicine

## 2013-01-05 ENCOUNTER — Encounter (HOSPITAL_COMMUNITY): Payer: Self-pay | Admitting: *Deleted

## 2013-01-05 DIAGNOSIS — I10 Essential (primary) hypertension: Secondary | ICD-10-CM

## 2013-01-05 DIAGNOSIS — K922 Gastrointestinal hemorrhage, unspecified: Secondary | ICD-10-CM

## 2013-01-05 DIAGNOSIS — R011 Cardiac murmur, unspecified: Secondary | ICD-10-CM | POA: Insufficient documentation

## 2013-01-05 DIAGNOSIS — E2749 Other adrenocortical insufficiency: Secondary | ICD-10-CM | POA: Insufficient documentation

## 2013-01-05 DIAGNOSIS — I491 Atrial premature depolarization: Secondary | ICD-10-CM

## 2013-01-05 DIAGNOSIS — K5732 Diverticulitis of large intestine without perforation or abscess without bleeding: Secondary | ICD-10-CM

## 2013-01-05 DIAGNOSIS — K5731 Diverticulosis of large intestine without perforation or abscess with bleeding: Principal | ICD-10-CM | POA: Insufficient documentation

## 2013-01-05 DIAGNOSIS — D508 Other iron deficiency anemias: Secondary | ICD-10-CM | POA: Diagnosis present

## 2013-01-05 DIAGNOSIS — E785 Hyperlipidemia, unspecified: Secondary | ICD-10-CM | POA: Diagnosis present

## 2013-01-05 DIAGNOSIS — Z9049 Acquired absence of other specified parts of digestive tract: Secondary | ICD-10-CM | POA: Insufficient documentation

## 2013-01-05 DIAGNOSIS — J45909 Unspecified asthma, uncomplicated: Secondary | ICD-10-CM

## 2013-01-05 DIAGNOSIS — D131 Benign neoplasm of stomach: Secondary | ICD-10-CM | POA: Insufficient documentation

## 2013-01-05 DIAGNOSIS — K589 Irritable bowel syndrome without diarrhea: Secondary | ICD-10-CM | POA: Insufficient documentation

## 2013-01-05 DIAGNOSIS — E039 Hypothyroidism, unspecified: Secondary | ICD-10-CM

## 2013-01-05 DIAGNOSIS — K5190 Ulcerative colitis, unspecified, without complications: Secondary | ICD-10-CM

## 2013-01-05 DIAGNOSIS — K219 Gastro-esophageal reflux disease without esophagitis: Secondary | ICD-10-CM | POA: Insufficient documentation

## 2013-01-05 DIAGNOSIS — Z79899 Other long term (current) drug therapy: Secondary | ICD-10-CM | POA: Insufficient documentation

## 2013-01-05 DIAGNOSIS — D62 Acute posthemorrhagic anemia: Secondary | ICD-10-CM

## 2013-01-05 DIAGNOSIS — J4530 Mild persistent asthma, uncomplicated: Secondary | ICD-10-CM | POA: Diagnosis present

## 2013-01-05 DIAGNOSIS — IMO0002 Reserved for concepts with insufficient information to code with codable children: Secondary | ICD-10-CM | POA: Insufficient documentation

## 2013-01-05 HISTORY — PX: COLONOSCOPY: SHX5424

## 2013-01-05 HISTORY — DX: Gastrointestinal hemorrhage, unspecified: K92.2

## 2013-01-05 HISTORY — PX: ESOPHAGOGASTRODUODENOSCOPY: SHX5428

## 2013-01-05 LAB — CBC WITH DIFFERENTIAL/PLATELET
Basophils Absolute: 0 10*3/uL (ref 0.0–0.1)
Basophils Relative: 0 % (ref 0–1)
Eosinophils Relative: 1 % (ref 0–5)
Lymphocytes Relative: 15 % (ref 12–46)
MCHC: 33 g/dL (ref 30.0–36.0)
MCV: 85.6 fL (ref 78.0–100.0)
Neutro Abs: 10.7 10*3/uL — ABNORMAL HIGH (ref 1.7–7.7)
Platelets: 259 10*3/uL (ref 150–400)
RDW: 15.5 % (ref 11.5–15.5)
WBC: 14.1 10*3/uL — ABNORMAL HIGH (ref 4.0–10.5)

## 2013-01-05 LAB — HEMOGLOBIN AND HEMATOCRIT, BLOOD
HCT: 29.7 % — ABNORMAL LOW (ref 36.0–46.0)
HCT: 27.3 % — ABNORMAL LOW (ref 36.0–46.0)
Hemoglobin: 9.8 g/dL — ABNORMAL LOW (ref 12.0–15.0)
Hemoglobin: 9.2 g/dL — ABNORMAL LOW (ref 12.0–15.0)

## 2013-01-05 LAB — TYPE AND SCREEN
ABO/RH(D): A POS
Antibody Screen: NEGATIVE

## 2013-01-05 LAB — PROTIME-INR
INR: 1.09 (ref 0.00–1.49)
INR: 1.1 (ref 0.00–1.49)
Prothrombin Time: 14 seconds (ref 11.6–15.2)

## 2013-01-05 LAB — COMPREHENSIVE METABOLIC PANEL
ALT: 16 U/L (ref 0–35)
AST: 16 U/L (ref 0–37)
Albumin: 3.1 g/dL — ABNORMAL LOW (ref 3.5–5.2)
CO2: 25 mEq/L (ref 19–32)
Calcium: 9.3 mg/dL (ref 8.4–10.5)
Creatinine, Ser: 0.7 mg/dL (ref 0.50–1.10)
GFR calc non Af Amer: 79 mL/min — ABNORMAL LOW (ref >90)
Sodium: 140 mEq/L (ref 135–145)

## 2013-01-05 LAB — MRSA PCR SCREENING: MRSA by PCR: NEGATIVE

## 2013-01-05 LAB — ABO/RH: ABO/RH(D): A POS

## 2013-01-05 LAB — APTT: aPTT: 26 seconds (ref 24–37)

## 2013-01-05 SURGERY — COLONOSCOPY
Anesthesia: Moderate Sedation

## 2013-01-05 MED ORDER — FLUTICASONE PROPIONATE HFA 110 MCG/ACT IN AERO
1.0000 | INHALATION_SPRAY | Freq: Two times a day (BID) | RESPIRATORY_TRACT | Status: DC
Start: 2013-01-05 — End: 2013-01-09
  Administered 2013-01-05 – 2013-01-09 (×8): 1 via RESPIRATORY_TRACT
  Filled 2013-01-05 (×3): qty 12

## 2013-01-05 MED ORDER — HYDROCORTISONE SOD SUCCINATE 100 MG IJ SOLR
100.0000 mg | Freq: Once | INTRAMUSCULAR | Status: AC
  Administered 2013-01-05: 100 mg via INTRAVENOUS
  Filled 2013-01-05: qty 2

## 2013-01-05 MED ORDER — SODIUM CHLORIDE 0.9 % IV SOLN
INTRAVENOUS | Status: AC
  Administered 2013-01-05: 10:00:00 via INTRAVENOUS

## 2013-01-05 MED ORDER — GUAIFENESIN-DM 100-10 MG/5ML PO SYRP: 5.0000 mL | ORAL_SOLUTION | ORAL | Status: DC | PRN

## 2013-01-05 MED ORDER — MORPHINE SULFATE 2 MG/ML IJ SOLN: 1.0000 mg | INTRAMUSCULAR | Status: DC | PRN

## 2013-01-05 MED ORDER — MIDAZOLAM HCL 5 MG/ML IJ SOLN
INTRAMUSCULAR | Status: AC
  Filled 2013-01-05: qty 3

## 2013-01-05 MED ORDER — DIPHENHYDRAMINE HCL 50 MG/ML IJ SOLN
INTRAMUSCULAR | Status: AC
  Filled 2013-01-05: qty 1

## 2013-01-05 MED ORDER — ONDANSETRON HCL 4 MG/2ML IJ SOLN: 4.0000 mg | Freq: Four times a day (QID) | INTRAMUSCULAR | Status: DC | PRN

## 2013-01-05 MED ORDER — LEVOTHYROXINE SODIUM 75 MCG PO TABS
75.0000 ug | ORAL_TABLET | Freq: Every day | ORAL | Status: DC
  Administered 2013-01-06 – 2013-01-09 (×4): 75 ug via ORAL
  Filled 2013-01-05 (×5): qty 1

## 2013-01-05 MED ORDER — POTASSIUM CHLORIDE CRYS ER 20 MEQ PO TBCR
40.0000 meq | EXTENDED_RELEASE_TABLET | Freq: Once | ORAL | Status: AC
  Administered 2013-01-05: 40 meq via ORAL
  Filled 2013-01-05: qty 2

## 2013-01-05 MED ORDER — SODIUM CHLORIDE 0.9 % IJ SOLN
3.0000 mL | Freq: Two times a day (BID) | INTRAMUSCULAR | Status: DC
  Administered 2013-01-05: 3 mL via INTRAVENOUS
  Administered 2013-01-05: 16:00:00 via INTRAVENOUS
  Administered 2013-01-06 – 2013-01-07 (×3): 3 mL via INTRAVENOUS

## 2013-01-05 MED ORDER — BUTAMBEN-TETRACAINE-BENZOCAINE 2-2-14 % EX AERO
INHALATION_SPRAY | CUTANEOUS | Status: DC | PRN
  Administered 2013-01-05: 2 via TOPICAL

## 2013-01-05 MED ORDER — ALBUTEROL SULFATE (5 MG/ML) 0.5% IN NEBU: 2.5000 mg | INHALATION_SOLUTION | RESPIRATORY_TRACT | Status: DC | PRN

## 2013-01-05 MED ORDER — METOPROLOL TARTRATE 1 MG/ML IV SOLN: 5.0000 mg | INTRAVENOUS | Status: DC | PRN

## 2013-01-05 MED ORDER — SODIUM CHLORIDE 0.9 % IV SOLN
INTRAVENOUS | Status: DC
  Administered 2013-01-05: 16:00:00 via INTRAVENOUS

## 2013-01-05 MED ORDER — MIDAZOLAM HCL 10 MG/2ML IJ SOLN: INTRAMUSCULAR | Status: DC | PRN

## 2013-01-05 MED ORDER — BION TEARS 0.1-0.3 % OP SOLN: 1.0000 [drp] | Freq: Two times a day (BID) | OPHTHALMIC | Status: DC

## 2013-01-05 MED ORDER — SODIUM CHLORIDE 0.9 % IV SOLN: INTRAVENOUS | Status: DC

## 2013-01-05 MED ORDER — SODIUM CHLORIDE 0.9 % IV BOLUS (SEPSIS)
500.0000 mL | Freq: Once | INTRAVENOUS | Status: AC
  Administered 2013-01-05: 500 mL via INTRAVENOUS

## 2013-01-05 MED ORDER — FENTANYL CITRATE 0.05 MG/ML IJ SOLN
INTRAMUSCULAR | Status: AC
  Filled 2013-01-05: qty 4

## 2013-01-05 MED ORDER — LORAZEPAM 0.5 MG PO TABS
0.5000 mg | ORAL_TABLET | Freq: Every day | ORAL | Status: DC
  Administered 2013-01-05 – 2013-01-07 (×3): 0.5 mg via ORAL
  Filled 2013-01-05 (×3): qty 1

## 2013-01-05 MED ORDER — ONDANSETRON HCL 4 MG PO TABS: 4.0000 mg | ORAL_TABLET | Freq: Four times a day (QID) | ORAL | Status: DC | PRN

## 2013-01-05 MED ORDER — PREDNISONE 5 MG PO TABS
5.0000 mg | ORAL_TABLET | Freq: Every day | ORAL | Status: DC
  Administered 2013-01-05 – 2013-01-09 (×5): 5 mg via ORAL
  Filled 2013-01-05 (×5): qty 1

## 2013-01-05 MED ORDER — MIDAZOLAM HCL 5 MG/5ML IJ SOLN
INTRAMUSCULAR | Status: DC | PRN
  Administered 2013-01-05 (×4): 2 mg via INTRAVENOUS

## 2013-01-05 MED ORDER — POLYVINYL ALCOHOL 1.4 % OP SOLN
1.0000 [drp] | OPHTHALMIC | Status: DC | PRN
  Administered 2013-01-05: 1 [drp] via OPHTHALMIC
  Filled 2013-01-05: qty 15

## 2013-01-05 MED ORDER — PANTOPRAZOLE SODIUM 40 MG IV SOLR
40.0000 mg | INTRAVENOUS | Status: DC
  Administered 2013-01-05 – 2013-01-06 (×2): 40 mg via INTRAVENOUS
  Filled 2013-01-05 (×4): qty 40

## 2013-01-05 MED ORDER — FENTANYL CITRATE 0.05 MG/ML IJ SOLN
INTRAMUSCULAR | Status: DC | PRN
  Administered 2013-01-05 (×2): 25 ug via INTRAVENOUS

## 2013-01-05 NOTE — Consult Note (Signed)
Referring Provider: Dr. Burney Gauze ([facility]) Primary Care Physician:  Minda Meo, MD Primary Gastroenterologist:  Dr. Carman Ching  Reason for Consultation:  GI bleeding  HPI: Sierra [name] is a 79 y.o. female who has had approximately 8 bloody bowel movements, some of them quite large in volume, since 6:00 this morning. She has had no associated abdominal pain. The most recent episode was a large "blowout" about 2 hours ago, and was associated with near syncope.   She is followed by my partner, Dr. Vilinda Boehringer, with a past history of diverticulitis, approximately 12 years status post sigmoid colectomy without subsequent recurrence, and in more recent years, moderately severe left-sided ulcerative colitis diagnosed about 4 years ago, with her most recent sigmoidoscopy, 2 years ago, having shown mild inflammatory changes. She is maintained on chronic low-dose prednisone because of adrenal insufficiency, and her colitis has been clinically quiescent as of when Dr. Randa Evens last saw her in September 2013.   Of note, in her colonoscopy in 2010, and both of her sigmoidoscopies in 2011 and 2012, there was no evidence of residual diverticulosis.  Risk factors for bleeding: Recent mild use of nonsteroidal anti-inflammatory drugs (Aleve), chronic prednisone therapy, history of diverticulosis, history of ulcerative colitis.  Note that the patient did not have any antecedent symptoms prior to this morning's bleeding, such as change in bowel habits to suggest exacerbation of colitis (she has not seen recent bleeding prior to today, and was having her normal bowel pattern with a tendency to alternate between constipation and diarrhea), no prodromal dyspeptic symptomatology, no use of Aleve for the past several weeks.  In the emergency room, the patient's initial hemoglobin was 11.6, with a BUN that was normal. Baseline vital signs were normal, although the patient, as noted, did become  presyncopal after one of her episodes of hematochezia.  Past Medical History  Diagnosis Date  . Ulcerative colitis   . Hypertension   . Hyperlipemia   . Diverticulitis, colon   . Other specified iron deficiency anemias   . Thrombocytosis   . Leukocytosis   . Esophageal reflux   . Chronic pharyngitis   . Allergic rhinitis   . Acute asthmatic bronchitis   . Hypothyroidism   . Cardiac murmur     DOES NOT CAUSE ANY SYMPTOMS  . Recurrent upper respiratory infection (URI)     ON 07/10/11 PT SAW DR. Joni Fears YOUNG FOR TX OF ACUTE BRONCHITIS --GIVEN  EXTRA PREDNISONE ( IN ADDITION TO THE DAILY PREDNISONE PT TAKES)  AND PT FINISHED A Z-PAK--STILL HAS A COUGH TODAY 07/15/11-NO FEVER.  Marland Kitchen Blood transfusion   . Arthritis     HX OF RT SHOULDER BURSITIS-AND THE SHOULDER IS HURTING AT PRESENT, PAIN IN LEFT KNEE AT PRESENT  AND PAIN IN LEFT HIP  . IBS (irritable bowel syndrome)   . Balance problem     SINCE BRAIN TUMOR REMOVED IN 2002-BENIGN TUMOR-PT HAS ADRENAL INSUFFICIENCY AND TAKES DAILY PREDNISONE    Past Surgical History  Procedure Laterality Date  . Partial colectomy  2008  . Total hip arthroplasty  OCT 2006    right  . Vesicovaginal fistula closure w/ tah    . Thyroidectomy  1986  . Appendectomy    . Craniotomy    . Subglottal mucocoel  2000  . Meningioma resected  20O2  . Frontalis suspension  10-09-2010    lifting eyelids AND SECOND EYE SURGERY November 19, 2010  . Tonsillectomy  1938  . Abdominal hysterectomy  1985  .  Dilation and curettage of uterus  1967  . Eye surgery      2003 -RIGHT CATARACT EXTRACTED AND LEFT WAS DONE IN 2004  . Wrist tendon lesion removed 2007    . Total hip arthroplasty  07/27/2011    Procedure: TOTAL HIP ARTHROPLASTY;  Surgeon: Loanne Drilling, MD;  Location: [facility];  Service: Orthopedics;  Laterality: Left;    Prior to Admission medications   Medication Sig Start Date End Date Taking? Authorizing Provider  acetaminophen (TYLENOL) 500 MG tablet Take 500  mg by mouth every 6 (six) hours as needed for pain.   Yes Historical Provider, MD  albuterol (PROVENTIL HFA;VENTOLIN HFA) 108 (90 BASE) MCG/ACT inhaler Inhale 2 puffs into the lungs every 6 (six) hours as needed for wheezing or shortness of breath. 04/29/12 04/29/13 Yes Clinton D Young, MD  amLODipine (NORVASC) 2.5 MG tablet Take 2.5 mg by mouth daily after breakfast.   Yes Historical Provider, MD  Artificial Tear Solution (BION TEARS) 0.1-0.3 % SOLN Place 1 drop into both eyes as needed.   Yes Historical Provider, MD  B Complex-C (B-COMPLEX WITH VITAMIN C) tablet Take 1 tablet by mouth daily.   Yes Historical Provider, MD  Biotin 5000 MCG CAPS Take 5,000 mcg by mouth daily.   Yes Historical Provider, MD  cholecalciferol (VITAMIN D) 1000 UNITS tablet Take 1,000 Units by mouth daily.   Yes Historical Provider, MD  fexofenadine (ALLEGRA) 180 MG tablet Take 60-180 mg by mouth daily as needed. Allergies 07/09/11  Yes Waymon Budge, MD  fluticasone (FLOVENT HFA) 110 MCG/ACT inhaler Inhale 1 puff into the lungs 2 (two) times daily.   Yes Historical Provider, MD  levothyroxine (SYNTHROID, LEVOTHROID) 75 MCG tablet Take 75 mcg by mouth daily before breakfast.    Yes Historical Provider, MD  LORazepam (ATIVAN) 0.5 MG tablet Take 0.5 mg by mouth daily as needed.    Yes Historical Provider, MD  Multiple Vitamin (MULTIVITAMIN) tablet Take 1 tablet by mouth daily.   Yes Historical Provider, MD  omeprazole (PRILOSEC) 20 MG capsule Take 20 mg by mouth every other day.   Yes Historical Provider, MD  ondansetron (ZOFRAN-ODT) 4 MG disintegrating tablet Take 1 tablet (4 mg total) by mouth every 8 (eight) hours as needed for nausea. 12/18/12  Yes Collene Gobble, MD  predniSONE (DELTASONE) 5 MG tablet Take 5 mg by mouth daily.   Yes Historical Provider, MD  amoxicillin-clavulanate (AUGMENTIN) 875-125 MG per tablet Take 1 tablet by mouth 2 (two) times daily. 12/18/12   Collene Gobble, MD  Spacer/Aero-Holding Chambers  (AEROCHAMBER MV) inhaler Use as instructed with flovent  110 MCG ONE PUFF TWICE A DAY    Historical Provider, MD    Current Facility-Administered Medications  Medication Dose Route Frequency Provider Last Rate Last Dose  . 0.9 %  sodium chloride infusion   Intravenous STAT Loren Racer, MD 75 mL/hr at 01/05/13 1014    . potassium chloride SA (K-DUR,KLOR-CON) CR tablet 40 mEq  40 mEq Oral Once Leroy Sea, MD       Current Outpatient Prescriptions  Medication Sig Dispense Refill  . acetaminophen (TYLENOL) 500 MG tablet Take 500 mg by mouth every 6 (six) hours as needed for pain.      Marland Kitchen albuterol (PROVENTIL HFA;VENTOLIN HFA) 108 (90 BASE) MCG/ACT inhaler Inhale 2 puffs into the lungs every 6 (six) hours as needed for wheezing or shortness of breath.  1 Inhaler  prn  . amLODipine (NORVASC) 2.5 MG  tablet Take 2.5 mg by mouth daily after breakfast.      . Artificial Tear Solution (BION TEARS) 0.1-0.3 % SOLN Place 1 drop into both eyes as needed.      . B Complex-C (B-COMPLEX WITH VITAMIN C) tablet Take 1 tablet by mouth daily.      . Biotin 5000 MCG CAPS Take 5,000 mcg by mouth daily.      . cholecalciferol (VITAMIN D) 1000 UNITS tablet Take 1,000 Units by mouth daily.      . fexofenadine (ALLEGRA) 180 MG tablet Take 60-180 mg by mouth daily as needed. Allergies      . fluticasone (FLOVENT HFA) 110 MCG/ACT inhaler Inhale 1 puff into the lungs 2 (two) times daily.      Marland Kitchen levothyroxine (SYNTHROID, LEVOTHROID) 75 MCG tablet Take 75 mcg by mouth daily before breakfast.       . LORazepam (ATIVAN) 0.5 MG tablet Take 0.5 mg by mouth daily as needed.       . Multiple Vitamin (MULTIVITAMIN) tablet Take 1 tablet by mouth daily.      Marland Kitchen omeprazole (PRILOSEC) 20 MG capsule Take 20 mg by mouth every other day.      . ondansetron (ZOFRAN-ODT) 4 MG disintegrating tablet Take 1 tablet (4 mg total) by mouth every 8 (eight) hours as needed for nausea.  20 tablet  0  . predniSONE (DELTASONE) 5 MG tablet Take  5 mg by mouth daily.      Marland Kitchen amoxicillin-clavulanate (AUGMENTIN) 875-125 MG per tablet Take 1 tablet by mouth 2 (two) times daily.  14 tablet  0  . Spacer/Aero-Holding Chambers (AEROCHAMBER MV) inhaler Use as instructed with flovent  110 MCG ONE PUFF TWICE A DAY      . [DISCONTINUED] Alum & Mag Hydroxide-Simeth (MAGIC MOUTHWASH) SOLN Take 5 mLs by mouth 3 (three) times daily as needed. Thrush         Allergies as of 01/05/2013 - Review Complete 01/05/2013  Allergen Reaction Noted  . Codeine Nausea And Vomiting   . Doxycycline  04/28/2011  . Restasis [cyclosporine] Other (See Comments) 07/15/2011  . Ciprofloxacin Rash   . Phenytoin Rash   . Sulfonamide derivatives Rash     Family History  Problem Relation Age of Onset  . Heart attack Father     deceased    History   Social History  . Marital Status: Married    Spouse Name: N/A    Number of Children: 2  . Years of Education: N/A   Occupational History  . Retired Therapist, sports, [employer] school   Social History Main Topics  . Smoking status: Never Smoker   . Smokeless tobacco: Never Used  . Alcohol Use: Yes     Comment: occ glass of wine  . Drug Use: No  . Sexual Activity: No   Other Topics Concern  . 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 Negative for antecedent anorexia, dysphagia, reflux symptomatology (has history of GERD which is well controlled by Prilosec) abdominal pain, rectal bleeding, or persistent diarrhea or constipation (normally does have some irregularity of bowel habit, alternating between constipation and diarrhea).  Physical Exam: Vital signs in last 24 hours: Temp:  [97.7 F (36.5 C)] 97.7 F (36.5 C) (08/28 0703) Pulse Rate:  [93-100] 93 (  08/28 0730) Resp:  [17-19] 17 (08/28 1000) BP: (111-121)/(47-65) 121/52 mmHg (08/28 1000) SpO2:   [94 %] 94 % (08/28 0730) Weight:  [57.607 kg (127 lb)] 57.607 kg (127 lb) (08/28 0703)   General:   Alert,  Well-developed, well-nourished, pleasant and cooperative in NAD Head:  Normocephalic and atraumatic. Eyes:  Sclera clear, no icterus.   Conjunctiva slightly pale. Mouth:   No ulcerations or lesions.  Oropharynx pink but dry. Neck:   No masses or thyromegaly. Lungs:  Clear throughout to auscultation.   No wheezes, crackles, or rhonchi. No evident respiratory distress. Heart:   Regular rate and rhythm; no murmurs, clicks, rubs,  or gallops. Abdomen:  Soft, nontender, and nondistended. No masses, hepatosplenomegaly or ventral hernias noted. Normal bowel sounds, without bruits, guarding, or rebound.   Msk:   Symmetrical without gross deformities. Pulses:  Full radial pulse Extremities:   Without clubbing, cyanosis, or edema. Neurologic:  Alert and coherent;  grossly normal neurologically. Skin:  Intact without significant lesions or rashes. Cervical Nodes:  No significant cervical adenopathy. Psych:   Alert and cooperative. Normal mood and affect.  Intake/Output from previous day:   Intake/Output this shift:    Lab Results:  Recent Labs  01/05/13 0810  WBC 14.1*  HGB 11.6*  HCT 35.2*  PLT 259   BMET  Recent Labs  01/05/13 0810  NA 140  K 3.3*  CL 103  CO2 25  GLUCOSE 175*  BUN 15  CREATININE 0.70  CALCIUM 9.3   LFT  Recent Labs  01/05/13 0810  PROT 6.3  ALBUMIN 3.1*  AST 16  ALT 16  ALKPHOS 37*  BILITOT 0.3   PT/INR  Recent Labs  01/05/13 0810  LABPROT 13.9  INR 1.09     Studies/Results: No results found.  Impression: GI bleeding, most compatible with lower GI bleeding. The pattern is suggestive of diverticular origin but it is not clear that the patient has any residual diverticular disease, based on review of her most recent colonoscopy report from 2010. She does have a history of ulcerative colitis, but this would be an unusual pattern of  onset and severity of bleeding from that condition. Upper tract bleeding seems unlikely given her normal BUN and relative hemodynamic stability  Plan: Begin with endoscopy to rule out an upper tract source, followed by unprepped colonoscopy or sigmoidoscopy to exclude significant colitis activity, to try to grossly localize the region of the colon that is bleeding, and to look for evidence of residual diverticulosis. The nature, purpose, and risks of these procedures are familiar to the patient and her husband I reviewed them and they are agreeable to proceed. Further management will depend on the findings of these examinations.   LOS: 0 days   Ezri Landers V  01/05/2013, 11:05 AM

## 2013-01-05 NOTE — Consult Note (Signed)
Sierra [name] 12-09-1928  [id number].   Primary Care MD: Dr. Geoffry Paradise Requesting MD: Dr. Susa Raring Chief Complaint/Reason for Consult: GI bleed HPI: This is an 79 yo female who woke up this morning around 6am and went to the bathroom and had a large bloody BM with multiple clots.  This was bright red in nature.  She has since had 6 more episodes.  She denies weakness or dizziness until she tried to get up here and had a pre-syncopal episode.  Her last BM was at 9:30am.  She has not had any blood per rectum in over 6 months.  She normally gets a colonoscopy every 3 years.  Her last colonoscopy was normal.  There was no evidence of diverticular disease on that exam.  She also has a h/o sigmoid colectomy for diverticulitis in 2008 by Dr. Ezzard Standing.  The patient is being admitted and we have been asked to see to be on board and follow along.   Review of Systems: Please see HPI, otherwise she c/o balance issues that are chronic as well as burning of her tongue that is chronic as well.  Otherwise all other systems are currently negative.  Family History  Problem Relation Age of Onset  . Heart attack Father     deceased    Past Medical History  Diagnosis Date  . Ulcerative colitis   . Hypertension   . Hyperlipemia   . Diverticulitis, colon   . Other specified iron deficiency anemias   . Thrombocytosis   . Leukocytosis   . Esophageal reflux   . Chronic pharyngitis   . Allergic rhinitis   . Acute asthmatic bronchitis   . Hypothyroidism   . Cardiac murmur     DOES NOT CAUSE ANY SYMPTOMS  . Recurrent upper respiratory infection (URI)     ON 07/10/11 PT SAW DR. Joni Fears YOUNG FOR TX OF ACUTE BRONCHITIS --GIVEN  EXTRA PREDNISONE ( IN ADDITION TO THE DAILY PREDNISONE PT TAKES)  AND PT FINISHED A Z-PAK--STILL HAS A COUGH TODAY 07/15/11-NO FEVER.  Marland Kitchen Blood transfusion   . Arthritis     HX OF RT SHOULDER BURSITIS-AND THE SHOULDER IS HURTING AT PRESENT, PAIN IN LEFT KNEE AT PRESENT  AND PAIN  IN LEFT HIP  . IBS (irritable bowel syndrome)   . Balance problem     SINCE BRAIN TUMOR REMOVED IN 2002-BENIGN TUMOR-PT HAS ADRENAL INSUFFICIENCY AND TAKES DAILY PREDNISONE    Past Surgical History  Procedure Laterality Date  . Partial colectomy  2008  . Total hip arthroplasty  OCT 2006    right  . Vesicovaginal fistula closure w/ tah    . Thyroidectomy  1986  . Appendectomy    . Craniotomy    . Subglottal mucocoel  2000  . Meningioma resected  20O2  . Frontalis suspension  10-09-2010    lifting eyelids AND SECOND EYE SURGERY November 19, 2010  . Tonsillectomy  1938  . Abdominal hysterectomy  1985  . Dilation and curettage of uterus  1967  . Eye surgery      2003 -RIGHT CATARACT EXTRACTED AND LEFT WAS DONE IN 2004  . Wrist tendon lesion removed 2007    . Total hip arthroplasty  07/27/2011    Procedure: TOTAL HIP ARTHROPLASTY;  Surgeon: Loanne Drilling, MD;  Location: [facility];  Service: Orthopedics;  Laterality: Left;    Social History:  reports that she has never smoked. She has never used smokeless tobacco. She reports that  drinks alcohol.  She reports that she does not use illicit drugs.  Allergies:  Allergies  Allergen Reactions  . Codeine Nausea And Vomiting  . Doxycycline     Very nauseated  . Restasis [Cyclosporine] Other (See Comments)    Eyes burn   . Ciprofloxacin Rash  . Phenytoin Rash  . Sulfonamide Derivatives Rash     (Not in a hospital admission)  Blood pressure 121/52, pulse 93, temperature 97.7 F (36.5 C), temperature source Oral, resp. rate 17, height 5\' 3"  (1.6 m), weight 127 lb (57.607 kg), SpO2 94.00%. Physical Exam: General: pleasant, WD, WN white female who is laying in bed in NAD HEENT: head is normocephalic, atraumatic.  Sclera are noninjected.  PERRL.  Ears and nose without any masses or lesions.  Mouth is pink and moist Heart: regular, rate, and rhythm.  Normal s1,s2. +murmurs, no obvious gallops, or rubs noted.  Palpable radial and pedal  pulses bilaterally Lungs: CTAB, no wheezes, rhonchi, or rales noted.  Respiratory effort nonlabored Abd: soft, NT, ND, +BS, no masses, hernias, or organomegaly MS: all 4 extremities are symmetrical with no cyanosis, clubbing, or edema. Skin: warm and dry with no masses, lesions, or rashes Psych: A&Ox3 with an appropriate affect.    Results for orders placed during the hospital encounter of 01/05/13 (from the past 48 hour(s))  CBC WITH DIFFERENTIAL     Status: Abnormal   Collection Time    01/05/13  8:10 AM      Result Value Range   WBC 14.1 (*) 4.0 - 10.5 K/uL   RBC 4.11  3.87 - 5.11 MIL/uL   Hemoglobin 11.6 (*) 12.0 - 15.0 g/dL   HCT 16.1 (*) 09.6 - 04.5 %   MCV 85.6  78.0 - 100.0 fL   MCH 28.2  26.0 - 34.0 pg   MCHC 33.0  30.0 - 36.0 g/dL   RDW 40.9  81.1 - 91.4 %   Platelets 259  150 - 400 K/uL   Neutrophils Relative % 76  43 - 77 %   Neutro Abs 10.7 (*) 1.7 - 7.7 K/uL   Lymphocytes Relative 15  12 - 46 %   Lymphs Abs 2.1  0.7 - 4.0 K/uL   Monocytes Relative 8  3 - 12 %   Monocytes Absolute 1.1 (*) 0.1 - 1.0 K/uL   Eosinophils Relative 1  0 - 5 %   Eosinophils Absolute 0.2  0.0 - 0.7 K/uL   Basophils Relative 0  0 - 1 %   Basophils Absolute 0.0  0.0 - 0.1 K/uL  COMPREHENSIVE METABOLIC PANEL     Status: Abnormal   Collection Time    01/05/13  8:10 AM      Result Value Range   Sodium 140  135 - 145 mEq/L   Potassium 3.3 (*) 3.5 - 5.1 mEq/L   Chloride 103  96 - 112 mEq/L   CO2 25  19 - 32 mEq/L   Glucose, Bld 175 (*) 70 - 99 mg/dL   BUN 15  6 - 23 mg/dL   Creatinine, Ser 7.82  0.50 - 1.10 mg/dL   Calcium 9.3  8.4 - 95.6 mg/dL   Total Protein 6.3  6.0 - 8.3 g/dL   Albumin 3.1 (*) 3.5 - 5.2 g/dL   AST 16  0 - 37 U/L   ALT 16  0 - 35 U/L   Alkaline Phosphatase 37 (*) 39 - 117 U/L   Total Bilirubin 0.3  0.3 - 1.2 mg/dL  GFR calc non Af Amer 79 (*) >90 mL/min   GFR calc Af Amer >90  >90 mL/min   Comment: (NOTE)     The eGFR has been calculated using the CKD EPI  equation.     This calculation has not been validated in all clinical situations.     eGFR's persistently <90 mL/min signify possible Chronic Kidney     Disease.  PROTIME-INR     Status: None   Collection Time    01/05/13  8:10 AM      Result Value Range   Prothrombin Time 13.9  11.6 - 15.2 seconds   INR 1.09  0.00 - 1.49  APTT     Status: None   Collection Time    01/05/13  8:10 AM      Result Value Range   aPTT 26  24 - 37 seconds  TYPE AND SCREEN     Status: None   Collection Time    01/05/13  8:10 AM      Result Value Range   ABO/RH(D) A POS     Antibody Screen NEG     Sample Expiration 01/08/2013    ABO/RH     Status: None   Collection Time    01/05/13  8:10 AM      Result Value Range   ABO/RH(D) A POS     No results found.     Assessment/Plan 1. GI bleed, presumed lower, but not localized Patient Active Problem List   Diagnosis Date Noted  . LGI bleed 01/05/2013  . Malaise and fatigue 09/21/2011  . Benign hypertensive heart disease without heart failure 09/21/2011  . Atrial premature contractions 09/21/2011  . Postop Hyponatremia 07/28/2011  . Osteoarthritis of hip 07/27/2011  . PNEUMONIA 06/02/2010  . DYSPNEA ON EXERTION 05/30/2010  . ATYPICAL MYCOBACTERIAL INFECTION 05/05/2010  . HYPOTHYROIDISM 05/01/2010  . ULCERATIVE COLITIS 03/02/2010  . HOARSENESS, CHRONIC 11/05/2009  . THRUSH 04/30/2009  . Asthma with bronchitis 04/30/2009  . HYPERLIPIDEMIA 07/11/2007  . ANEMIA, IRON DEFICIENCY, MICROCYTIC 07/11/2007  . LEUKOCYTOSIS 07/11/2007  . THROMBOCYTOSIS 07/11/2007  . HYPERTENSION 07/11/2007  . Acute bronchitis 07/11/2007  . PHARYNGITIS, CHRONIC 07/11/2007  . Nonallergic vasomotor rhinitis 07/11/2007  . Esophageal reflux 07/11/2007  . DIVERTICULITIS, COLON 07/11/2007  . ARTHRITIS 07/11/2007  . CARDIAC MURMUR 07/11/2007   Plan: 1. Agree with current plan.  Patient is going to the endoscopy suite to at least get a sigmoidoscopy.  A full colonoscopy may  be beneficial though to localize the site of bleeding.  If site unable to be localized with endoscopy procedure, then she may require a NM bleeding scan to further localize if she doesn't stop bleeding.  If she does get localized with endo and unable to stop the bleeding she may require surgery.  However, if she is able to be localized on NM bleeding scan, then angioembolization would the next best step.  If that failed, then she may require surgery then as well.  Unfortunately, if she is unable to be localized, there is not much surgically to offer the patient.  We will follow along closely.  Calliope Delangel E 01/05/2013, 11:01 AM Pager: [phone]

## 2013-01-05 NOTE — Op Note (Signed)
[facility] [address] [place], [address]   ENDOSCOPY PROCEDURE REPORT  PATIENT: Sierra [name], Sierra [name]  MR#: [id number] BIRTHDATE: [birth year] , 83  yrs. old GENDER: Female ENDOSCOPIST:Khyra Viscuso, MD REFERRED BY:  Dr. Quillian Quince PROCEDURE DATE:  01/05/2013 PROCEDURE:   upper endoscopy ASA CLASS: INDICATIONS:   hematochezia MEDICATION:    Fentanyl 50 mg IV, Versed  4 mg IV TOPICAL ANESTHETIC:    Cetacaine spray  DESCRIPTION OF PROCEDURE:   the patient was brought over from the emergency room, having provided written consent. Time out was performed she received the above sedation, remaining stable throughout the procedure. I elected to use the Pentax pediatric upper endoscope for this exam, so as to allow use of less sedation.  Because of a history of adrenal insufficiency, and the fact that the patient had not yet taken her prednisone dose today, she was given hydrocortisone 100 mg IV or short while prior to the procedure.  The larynx looked normal. The scope was passed under direct vision.  The esophagus, especially in its proximal portion, have a lot of white exudate, but it was not be typical smooth, "staph aureus colonies" appearing exudate seen in Candida. It had an appearance more likely that of being from her Cetacaine spray, but because of her history of chronic steroid exposure which would place her at risk for esophageal candidiasis, I obtained an esophageal brushings for fungal smear at the conclusion of the procedure.  The esophagus was otherwise normal. Specifically, there was no evidence of esophagitis, varices, or neoplasia, nor any ring or stricture.  The stomach was entered. It contained a minimal clear residual. No blood or coffee grounds were present. The stomach was unremarkable, without evidence of erosions, ulcers, gastritis, vascular ectasia, or any masses. I think there were a few small gastric polyps, consistent with  chronic PPI therapy. A retroflexed view of the cardia was normal.  The pylorus, duodenal bulb, and second duodenum looked normal.  The scope was removed from the patient, who tolerated the procedure very well.     COMPLICATIONS: None  ENDOSCOPIC IMPRESSION:  1. No prospective source of bleeding identified in the upper GI tract 2. Exudate in esophagus, probably artifactual from Cetacaine spray, fungal smear pending 3. Otherwise, unremarkable upper endoscopy bolus Noted meds right  RECOMMENDATIONS:  1. Await fungal smear results and treat candida if present 2. Proceed to colonoscopic evaluation    _______________________________ eSigned:  Bernette Redbird, MD 01/05/2013 1:23 PM    PATIENT NAME:  [name], [name] MR#: [id number]

## 2013-01-05 NOTE — Consult Note (Signed)
I have seen and examined the pt and agree with PA-Osborne's H&P note. Sigmoidoscopy pending Would leave surgery as last option WIll follow

## 2013-01-05 NOTE — ED Notes (Signed)
Patient states rectal bleeding this am starting at approx 0600, patient states mixture of dark and bright red blood

## 2013-01-05 NOTE — H&P (Addendum)
[facility]                                                                                    Patient Demographics  Sierra [name], is a 79 y.o. female  CSN: [id number]  MRN: [id number]  DOB - [birth year]  Admit Date - 01/05/2013  Outpatient Primary MD for the patient is ARONSON,RICHARD A, MD   With History of -  Past Medical History  Diagnosis Date  . Ulcerative colitis   . Hypertension   . Hyperlipemia   . Diverticulitis, colon   . Other specified iron deficiency anemias   . Thrombocytosis   . Leukocytosis   . Esophageal reflux   . Chronic pharyngitis   . Allergic rhinitis   . Acute asthmatic bronchitis   . Hypothyroidism   . Cardiac murmur     DOES NOT CAUSE ANY SYMPTOMS  . Recurrent upper respiratory infection (URI)     ON 07/10/11 PT SAW DR. Joni Fears YOUNG FOR TX OF ACUTE BRONCHITIS --GIVEN  EXTRA PREDNISONE ( IN ADDITION TO THE DAILY PREDNISONE PT TAKES)  AND PT FINISHED A Z-PAK--STILL HAS A COUGH TODAY 07/15/11-NO FEVER.  Marland Kitchen Blood transfusion   . Arthritis     HX OF RT SHOULDER BURSITIS-AND THE SHOULDER IS HURTING AT PRESENT, PAIN IN LEFT KNEE AT PRESENT  AND PAIN IN LEFT HIP  . IBS (irritable bowel syndrome)   . Balance problem     SINCE BRAIN TUMOR REMOVED IN 2002-BENIGN TUMOR-PT HAS ADRENAL INSUFFICIENCY AND TAKES DAILY PREDNISONE      Past Surgical History  Procedure Laterality Date  . Partial colectomy  2008  . Total hip arthroplasty  OCT 2006    right  . Vesicovaginal fistula closure w/ tah    . Thyroidectomy  1986  . Appendectomy    . Craniotomy    . Subglottal mucocoel  2000  . Meningioma resected  20O2  . Frontalis suspension  10-09-2010    lifting eyelids AND SECOND EYE SURGERY November 19, 2010  . Tonsillectomy  1938  . Abdominal hysterectomy  1985  . Dilation and curettage of uterus  1967  . Eye surgery      2003 -RIGHT CATARACT EXTRACTED AND LEFT WAS DONE IN 2004  . Wrist tendon lesion removed 2007    . Total hip arthroplasty  07/27/2011     Procedure: TOTAL HIP ARTHROPLASTY;  Surgeon: Loanne Drilling, MD;  Location: [facility];  Service: Orthopedics;  Laterality: Left;    in for   Chief Complaint  Patient presents with  . Rectal Bleeding     HPI  Sierra [name]  is a 79 y.o. female, history of diverticulitis, mention of ulcerative colitis in chart, on 5 mg of prednisone chronically, history of partial colectomy 12 years ago patient says this was due to diverticulitis, hypertension, dyslipidemia, hypothyroidism who was in her usual state of health and doing well until about 6 AM this morning when she started experiencing bright red colored stools per rectum, she had about 5 or 6 episodes of bloody bowel movements at home she then presented to the ER where she had 2 more bright red bowel movements, she  felt weak all over, she was hemodynamically stable, GI was consulted who saw the patient and requested hospital is to admit the patient. Patient's primary care physician was also informed and he requested hospital is to admit the patient.  Patient is currently lying in bed she is completely symptom free without any pains aches or cramping, besides generalized weakness and bright red blood per rectum she has no subjective complaints. Denies any preceding diarrhea, she says she was completely in her usual state of health without any problems before this episode of bleeding occurred.    Review of Systems    In addition to the HPI above,   No Fever-chills, No Headache, No changes with Vision or hearing, No problems swallowing food or Liquids, No Chest pain, Cough or Shortness of Breath, No Abdominal pain, No Nausea or Vommitting, Bowel movements are bloody No Blood in  Urine, No dysuria, No new skin rashes or bruises, No new joints pains-aches,  No new weakness, tingling, numbness in any extremity, No recent weight gain or loss, No polyuria, polydypsia or polyphagia, No significant Mental Stressors.  A full 10 point Review of  Systems was done, except as stated above, all other Review of Systems were negative.   Social History History  Substance Use Topics  . Smoking status: Never Smoker   . Smokeless tobacco: Never Used  . Alcohol Use: Yes     Comment: occ glass of wine      Family History Family History  Problem Relation Age of Onset  . Heart attack Father     deceased      Prior to Admission medications   Medication Sig Start Date End Date Taking? Authorizing Provider  acetaminophen (TYLENOL) 500 MG tablet Take 500 mg by mouth every 6 (six) hours as needed for pain.   Yes Historical Provider, MD  albuterol (PROVENTIL HFA;VENTOLIN HFA) 108 (90 BASE) MCG/ACT inhaler Inhale 2 puffs into the lungs every 6 (six) hours as needed for wheezing or shortness of breath. 04/29/12 04/29/13 Yes Clinton D Young, MD  amLODipine (NORVASC) 2.5 MG tablet Take 2.5 mg by mouth daily after breakfast.   Yes Historical Provider, MD  Artificial Tear Solution (BION TEARS) 0.1-0.3 % SOLN Place 1 drop into both eyes as needed.   Yes Historical Provider, MD  B Complex-C (B-COMPLEX WITH VITAMIN C) tablet Take 1 tablet by mouth daily.   Yes Historical Provider, MD  Biotin 5000 MCG CAPS Take 5,000 mcg by mouth daily.   Yes Historical Provider, MD  cholecalciferol (VITAMIN D) 1000 UNITS tablet Take 1,000 Units by mouth daily.   Yes Historical Provider, MD  fexofenadine (ALLEGRA) 180 MG tablet Take 60-180 mg by mouth daily as needed. Allergies 07/09/11  Yes Waymon Budge, MD  fluticasone (FLOVENT HFA) 110 MCG/ACT inhaler Inhale 1 puff into the lungs 2 (two) times daily.   Yes Historical Provider, MD  levothyroxine (SYNTHROID, LEVOTHROID) 75 MCG tablet Take 75 mcg by mouth daily before breakfast.    Yes Historical Provider, MD  LORazepam (ATIVAN) 0.5 MG tablet Take 0.5 mg by mouth daily as needed.    Yes Historical Provider, MD  Multiple Vitamin (MULTIVITAMIN) tablet Take 1 tablet by mouth daily.   Yes Historical Provider, MD   omeprazole (PRILOSEC) 20 MG capsule Take 20 mg by mouth every other day.   Yes Historical Provider, MD  ondansetron (ZOFRAN-ODT) 4 MG disintegrating tablet Take 1 tablet (4 mg total) by mouth every 8 (eight) hours as needed for nausea. 12/18/12  Yes Collene Gobble, MD  predniSONE (DELTASONE) 5 MG tablet Take 5 mg by mouth daily.   Yes Historical Provider, MD  amoxicillin-clavulanate (AUGMENTIN) 875-125 MG per tablet Take 1 tablet by mouth 2 (two) times daily. 12/18/12   Collene Gobble, MD  Spacer/Aero-Holding Chambers (AEROCHAMBER MV) inhaler Use as instructed with flovent  110 MCG ONE PUFF TWICE A DAY    Historical Provider, MD    Allergies  Allergen Reactions  . Codeine Nausea And Vomiting  . Doxycycline     Very nauseated  . Restasis [Cyclosporine] Other (See Comments)    Eyes burn   . Ciprofloxacin Rash  . Phenytoin Rash  . Sulfonamide Derivatives Rash    Physical Exam  Vitals  Blood pressure 121/52, pulse 93, temperature 97.7 F (36.5 C), temperature source Oral, resp. rate 17, height 5\' 3"  (1.6 m), weight 57.607 kg (127 lb), SpO2 94.00%.   1. General elderly white female lying in bed in NAD,     2. Normal affect and insight, Not Suicidal or Homicidal, Awake Alert, Oriented X 3.  3. No F.N deficits, ALL C.Nerves Intact, Strength 5/5 all 4 extremities, Sensation intact all 4 extremities, Plantars down going.  4. Ears and Eyes appear Normal, Conjunctivae clear, PERRLA. Moist Oral Mucosa.  5. Supple Neck, No JVD, No cervical lymphadenopathy appriciated, No Carotid Bruits.  6. Symmetrical Chest wall movement, Good air movement bilaterally, CTAB.  7. RRR, No Gallops, Rubs or Murmurs, No Parasternal Heave.  8. Positive Bowel Sounds, Abdomen Soft, Non tender, No organomegaly appriciated,No rebound -guarding or rigidity.  9.  No Cyanosis, Normal Skin Turgor, No Skin Rash or Bruise.  10. Good muscle tone,  joints appear normal , no effusions, Normal ROM.  11. No Palpable  Lymph Nodes in Neck or Axillae     Data Review  CBC  Recent Labs Lab 01/05/13 0810  WBC 14.1*  HGB 11.6*  HCT 35.2*  PLT 259  MCV 85.6  MCH 28.2  MCHC 33.0  RDW 15.5  LYMPHSABS 2.1  MONOABS 1.1*  EOSABS 0.2  BASOSABS 0.0   ------------------------------------------------------------------------------------------------------------------  Chemistries   Recent Labs Lab 01/05/13 0810  NA 140  K 3.3*  CL 103  CO2 25  GLUCOSE 175*  BUN 15  CREATININE 0.70  CALCIUM 9.3  AST 16  ALT 16  ALKPHOS 37*  BILITOT 0.3   ------------------------------------------------------------------------------------------------------------------ estimated creatinine clearance is 45.6 ml/min (by C-G formula based on Cr of 0.7). ------------------------------------------------------------------------------------------------------------------ No results found for this basename: TSH, T4TOTAL, FREET3, T3FREE, THYROIDAB,  in the last 72 hours   Coagulation profile  Recent Labs Lab 01/05/13 0810  INR 1.09   ------------------------------------------------------------------------------------------------------------------- No results found for this basename: DDIMER,  in the last 72 hours -------------------------------------------------------------------------------------------------------------------  Cardiac Enzymes No results found for this basename: CK, CKMB, TROPONINI, MYOGLOBIN,  in the last 168 hours ------------------------------------------------------------------------------------------------------------------ No components found with this basename: POCBNP,    ---------------------------------------------------------------------------------------------------------------  Urinalysis    Component Value Date/Time   COLORURINE YELLOW 07/15/2011 1350   APPEARANCEUR CLEAR 07/15/2011 1350   LABSPEC 1.020 07/15/2011 1350   PHURINE 5.5 07/15/2011 1350   GLUCOSEU 500* 07/15/2011 1350    HGBUR MODERATE* 07/15/2011 1350   BILIRUBINUR NEGATIVE 07/15/2011 1350   KETONESUR NEGATIVE 07/15/2011 1350   PROTEINUR NEGATIVE 07/15/2011 1350   UROBILINOGEN 0.2 07/15/2011 1350   NITRITE NEGATIVE 07/15/2011 1350   LEUKOCYTESUR NEGATIVE 07/15/2011 1350    ----------------------------------------------------------------------------------------------------------------  Imaging results:       My personal review of  EKG: Rhythm NSR,  no Acute ST changes    Assessment & Plan    1. Lower GI bleed acute and ongoing- could be diverticular, there is mention of ulcerative colitis and her chart and she is chronically on 5 mg of prednisone, Dr. Matthias Hughs from GI has already seen the patient, I have also requested general surgery to evaluate the patient as they have done partial colectomy on her several years ago for a similar problem, I have also ordered a tagged RBC scan to localize bleeding in case this becomes an issue in the future, for now she will be admitted to a step down bed, will closely monitor H&H every 4 hours, type screen has been ordered, 2 large peripheral IVs will be obtained, IV fluids and n.p.o., goal will be to keep her hemoglobin above 8 in the light of acute bleed.    Addendum - updated by GI EGD negative, colonoscopy shows + for diverticulitis and clots, hold tagged PRBCs, as not fresh bleed, GI will follow.      2.Hypothyroidism. Continue oral supplementation. Check TSH baseline.    3. Hypertension and dyslipidemia. The n.p.o., will add when necessary medications IV will be ordered for blood pressure.    4. GERD. Will change PPI to IV for now.    5. History of asthma stable oxygen nebulizer treatments as needed.    6. Low potassium replace and monitor.    7.Chr Adrenal Insufficiency - secondary to chronic prednisone, monitor, if BP drops, IV steroids.     DVT Prophylaxis   SCDs    AM Labs Ordered, also please review Full Orders  Family Communication:  Admission, patients condition and plan of care including tests being ordered have been discussed with the patient and husband who indicate understanding and agree with the plan and Code Status.  Code Status Full  Likely DC to  Home  Time spent in minutes : 35  Condition GUARDED   Leroy Sea M.D on 01/05/2013 at 10:22 AM  Between 7am to 7pm - Pager - [phone]  After 7pm go to [email or web address] - password [email or web address]  And look for the night coverage person covering me after hours  [facility] Office  [phone]

## 2013-01-05 NOTE — ED Provider Notes (Signed)
CSN: [id number]     Arrival date & time 01/05/13  0654 History   First MD Initiated Contact with Patient 01/05/13 [phone]     Chief Complaint  Patient presents with  . Rectal Bleeding   (Consider location/radiation/quality/duration/timing/severity/associated sxs/prior Treatment) HPI Patient presents with 6 episodes of rectal bleeding with bowel movement since 6 AM. She has no pain with these bowel movements and she complains only of mild generalized weakness. Does have a history of diverticulitis status post partial colectomy. She is followed by Dr. Randa Evens. She denies shortness of breath, chest pain, focal weakness or sensory loss.  Past Medical History  Diagnosis Date  . Ulcerative colitis   . Hypertension   . Hyperlipemia   . Diverticulitis, colon   . Other specified iron deficiency anemias   . Thrombocytosis   . Leukocytosis   . Esophageal reflux   . Chronic pharyngitis   . Allergic rhinitis   . Acute asthmatic bronchitis   . Hypothyroidism   . Cardiac murmur     DOES NOT CAUSE ANY SYMPTOMS  . Recurrent upper respiratory infection (URI)     ON 07/10/11 PT SAW DR. Joni Fears YOUNG FOR TX OF ACUTE BRONCHITIS --GIVEN  EXTRA PREDNISONE ( IN ADDITION TO THE DAILY PREDNISONE PT TAKES)  AND PT FINISHED A Z-PAK--STILL HAS A COUGH TODAY 07/15/11-NO FEVER.  Marland Kitchen Blood transfusion   . Arthritis     HX OF RT SHOULDER BURSITIS-AND THE SHOULDER IS HURTING AT PRESENT, PAIN IN LEFT KNEE AT PRESENT  AND PAIN IN LEFT HIP  . IBS (irritable bowel syndrome)   . Balance problem     SINCE BRAIN TUMOR REMOVED IN 2002-BENIGN TUMOR-PT HAS ADRENAL INSUFFICIENCY AND TAKES DAILY PREDNISONE   Past Surgical History  Procedure Laterality Date  . Partial colectomy  2008  . Total hip arthroplasty  OCT 2006    right  . Vesicovaginal fistula closure w/ tah    . Thyroidectomy  1986  . Appendectomy    . Craniotomy    . Subglottal mucocoel  2000  . Meningioma resected  20O2  . Frontalis suspension  10-09-2010   lifting eyelids AND SECOND EYE SURGERY November 19, 2010  . Tonsillectomy  1938  . Abdominal hysterectomy  1985  . Dilation and curettage of uterus  1967  . Eye surgery      2003 -RIGHT CATARACT EXTRACTED AND LEFT WAS DONE IN 2004  . Wrist tendon lesion removed 2007    . Total hip arthroplasty  07/27/2011    Procedure: TOTAL HIP ARTHROPLASTY;  Surgeon: Loanne Drilling, MD;  Location: [facility];  Service: Orthopedics;  Laterality: Left;   Family History  Problem Relation Age of Onset  . Heart attack Father     deceased   History  Substance Use Topics  . Smoking status: Never Smoker   . Smokeless tobacco: Never Used  . Alcohol Use: Yes     Comment: occ glass of wine   OB History   Grav Para Term Preterm Abortions TAB SAB Ect Mult Living                 Review of Systems  Constitutional: Negative for fever and chills.  Respiratory: Negative for shortness of breath.   Cardiovascular: Negative for chest pain.  Gastrointestinal: Positive for blood in stool. Negative for nausea, vomiting, abdominal pain, diarrhea and constipation.  Musculoskeletal: Negative for myalgias and back pain.  Skin: Negative for rash and wound.  Neurological: Positive for weakness (generalized). Negative  for dizziness, light-headedness, numbness and headaches.    Allergies  Codeine; Doxycycline; Restasis; Ciprofloxacin; Phenytoin; and Sulfonamide derivatives  Home Medications   Current Outpatient Rx  Name  Route  Sig  Dispense  Refill  . acetaminophen (TYLENOL) 500 MG tablet   Oral   Take 500 mg by mouth every 6 (six) hours as needed for pain.         Marland Kitchen albuterol (PROVENTIL HFA;VENTOLIN HFA) 108 (90 BASE) MCG/ACT inhaler   Inhalation   Inhale 2 puffs into the lungs every 6 (six) hours as needed for wheezing or shortness of breath.   1 Inhaler   prn   . amLODipine (NORVASC) 2.5 MG tablet   Oral   Take 2.5 mg by mouth daily after breakfast.         . Artificial Tear Solution (BION TEARS) 0.1-0.3  % SOLN   Both Eyes   Place 1 drop into both eyes as needed.         . B Complex-C (B-COMPLEX WITH VITAMIN C) tablet   Oral   Take 1 tablet by mouth daily.         . Biotin 5000 MCG CAPS   Oral   Take 5,000 mcg by mouth daily.         . cholecalciferol (VITAMIN D) 1000 UNITS tablet   Oral   Take 1,000 Units by mouth daily.         . fexofenadine (ALLEGRA) 180 MG tablet   Oral   Take 60-180 mg by mouth daily as needed. Allergies         . fluticasone (FLOVENT HFA) 110 MCG/ACT inhaler   Inhalation   Inhale 1 puff into the lungs 2 (two) times daily.         Marland Kitchen levothyroxine (SYNTHROID, LEVOTHROID) 75 MCG tablet   Oral   Take 75 mcg by mouth daily before breakfast.          . LORazepam (ATIVAN) 0.5 MG tablet   Oral   Take 0.5 mg by mouth daily as needed.          . Multiple Vitamin (MULTIVITAMIN) tablet   Oral   Take 1 tablet by mouth daily.         Marland Kitchen omeprazole (PRILOSEC) 20 MG capsule   Oral   Take 20 mg by mouth every other day.         . ondansetron (ZOFRAN-ODT) 4 MG disintegrating tablet   Oral   Take 1 tablet (4 mg total) by mouth every 8 (eight) hours as needed for nausea.   20 tablet   0   . predniSONE (DELTASONE) 5 MG tablet   Oral   Take 5 mg by mouth daily.         Marland Kitchen amoxicillin-clavulanate (AUGMENTIN) 875-125 MG per tablet   Oral   Take 1 tablet by mouth 2 (two) times daily.   14 tablet   0   . Spacer/Aero-Holding Chambers (AEROCHAMBER MV) inhaler      Use as instructed with flovent  110 MCG ONE PUFF TWICE A DAY          BP 111/60  Pulse 100  Temp(Src) 97.7 F (36.5 C) (Oral)  Resp 18  Ht 5\' 3"  (1.6 m)  Wt 127 lb (57.607 kg)  BMI 22.5 kg/m2  SpO2 94% Physical Exam  Nursing note and vitals reviewed. Constitutional: She is oriented to person, place, and time. She appears well-developed and well-nourished. No distress.  HENT:  Head: Normocephalic and atraumatic.  Mouth/Throat: Oropharynx is clear and moist.  Eyes:  EOM are normal. Pupils are equal, round, and reactive to light.  Neck: Normal range of motion. Neck supple.  Cardiovascular: Normal rate and regular rhythm.   Pulmonary/Chest: Effort normal and breath sounds normal. No respiratory distress. She has no wheezes. She has no rales.  Abdominal: Soft. Bowel sounds are normal. She exhibits no distension and no mass. There is no tenderness. There is no rebound and no guarding.  Musculoskeletal: Normal range of motion. She exhibits no edema and no tenderness.  Neurological: She is alert and oriented to person, place, and time.  Patient moves all extremities without deficit. Sensation is grossly intact. She is alert and oriented. She has clear speech.  Skin: Skin is warm and dry. No rash noted. No erythema.  Psychiatric: She has a normal mood and affect. Her behavior is normal.    ED Course  Procedures (including critical care time) Labs Review Labs Reviewed  CBC WITH DIFFERENTIAL  COMPREHENSIVE METABOLIC PANEL  PROTIME-INR  APTT  TYPE AND SCREEN   Imaging Review No results found.  Date: 01/05/2013  Rate: 90  Rhythm: normal sinus rhythm  QRS Axis: normal  Intervals: normal  ST/T Wave abnormalities: normal  Conduction Disutrbances:none  Narrative Interpretation:   Old EKG Reviewed: When compared with prior EKGs no significant changes are seen    CRITICAL CARE Performed by: Ranae Palms, Guage Efferson Total critical care time: 30 min Critical care time was exclusive of separately billable procedures and treating other patients. Critical care was necessary to treat or prevent imminent or life-threatening deterioration. Critical care was time spent personally by me on the following activities: development of treatment plan with patient and/or surrogate as well as nursing, discussions with consultants, evaluation of patient's response to treatment, examination of patient, obtaining history from patient or surrogate, ordering and performing treatments  and interventions, ordering and review of laboratory studies, ordering and review of radiographic studies, pulse oximetry and re-evaluation of patient's condition. MDM  Patient has roughly one cup of dark red blood with clots in the bedside toilet.  Discussed with [facility] gastroenterology who will see the patient in the emergency department  Dr. Matthias Hughs is at bedside evaluating the patient at 8:15 AM  Discussed with Dr. Waynard Edwards. Will admit to a step down bed. Asked to have a H/H repeated in 2 hours with results called to the M.D.  Loren Racer, MD 01/05/13 [phone]

## 2013-01-05 NOTE — Progress Notes (Signed)
Endoscopy and colonoscopy well tolerated, and discussed with Dr. Thedore Mins.  This appears to be diverticular bleeding, which is currently stopped. Accordingly, I have recommended cancellation of the patient's previously scheduled bleeding scan, since it would almost certainly be negative at the present time.  Although I think observation would be appropriate for a minor rebleed, if the patient has persistent or destabilizing hemorrhage, I would get interventional radiology involved and see if they want a bleeding scan versus going straight to angiography, versus surgical management.  We will follow the patient with you for the time being. Please call if questions.

## 2013-01-05 NOTE — Op Note (Signed)
[facility] [address] [place], [address]   COLONOSCOPY PROCEDURE REPORT  PATIENT: Sierra [name], Sierra [name]  MR#: [id number] BIRTHDATE: Oct 14, 1928 , 83  yrs. old GENDER: Female ENDOSCOPIST: Bernette Redbird, MD REFERRED BY:   Dr. Othelia Pulling PROCEDURE DATE:  01/05/2013 PROCEDURE:     colonoscopy ASA CLASS: INDICATIONS:  hematochezia, negative endoscopy MEDICATIONS:    fentanyl 50 mcg, Versed 8 mg IV (including EGD)  DESCRIPTION OF PROCEDURE: the patient had been brought from the emergency room to the [facility] cone endoscopy unit. This procedure was performed immediately following her upper endoscopy.The procedure was done unprepped.  Stress steroid medication with hydrocortisone 100 mg IV push had been administered slightly prior to these procedures. She tolerated the sedation very well and remained stable throughout the procedure.  The Pentax pediatric video colonoscope was advanced without much difficulty to the cecum, as identified by transillumination of the right lower quadrant. The cecum was full of brown stool, so was unable to identify cecal landmarks although I thought I did get a brief glimpse of the ileal cecal valve.  The main finding on this exam was fresh appearing, clotted blood in the midportion of the colon, probably corresponding to the mid transverse colon. There was no active bleeding, and a point source or origin of the blood could not be identified.  changes consistent with the anastomosis pertaining to her previous sigmoid colectomy were present at about 15 cm from the external anal orifice.  The colonic remnant, corresponding to the left and transverse colon, had a moderately large amount of residual diverticular disease.  Interestingly, there was brown stool both proximal and distal to the area of clotted blood in the midportion of the colon. Specifically, there was a lot of brown stool in the rectum, despite the fact that the  patient had had florid hematochezia earlier today, and despite the fact that there was fresh red blood in the mid colon.  Despite a history of ulcerative colitis, the entire colon, particularly the rectum, had normal appearing mucosa. The rectum had an excellent vascular pattern.  No polyps, masses, or vascular ectasia were evident on this exam, although of course small or focal lesions or abnormalities could have been missed, due to the unprepped nature of this procedure.  Retroflexion in the rectum did not disclose any significant hemorrhoids, nor were any seen during pullout through the anal canal.     COMPLICATIONS: None  ENDOSCOPIC IMPRESSION:  1. No active bleeding at the time this exam 2. The bleeding appears to have originated from the midportion of the transverse colon, as evidenced by clotted blood in that location 3. Moderately extensive residual diverticulosis 4. No alternative source of bleeding, apart from diverticulosis, identified 5. No evidence of endoscopically significant ulcerative colitis activity at this time  RECOMMENDATIONS:  1. Okay to cancel the patient's bleeding scan, since she is not actively bleeding at this time 2. Continue observation and supportive care, with consideration of interventional radiology or possibly surgery if the patient has destabilizing or persistent hemorrhage    _______________________________ eSigned:  Bernette Redbird, MD 01/05/2013 1:36 PM     PATIENT NAME:  Sierra [name], Sierra [name] MR#: [id number]

## 2013-01-06 ENCOUNTER — Encounter (HOSPITAL_COMMUNITY): Payer: Self-pay | Admitting: Gastroenterology

## 2013-01-06 DIAGNOSIS — D62 Acute posthemorrhagic anemia: Secondary | ICD-10-CM | POA: Diagnosis not present

## 2013-01-06 DIAGNOSIS — J45909 Unspecified asthma, uncomplicated: Secondary | ICD-10-CM

## 2013-01-06 DIAGNOSIS — K922 Gastrointestinal hemorrhage, unspecified: Secondary | ICD-10-CM

## 2013-01-06 HISTORY — DX: Gastrointestinal hemorrhage, unspecified: K92.2

## 2013-01-06 LAB — BASIC METABOLIC PANEL
BUN: 10 mg/dL (ref 6–23)
Creatinine, Ser: 0.58 mg/dL (ref 0.50–1.10)
GFR calc Af Amer: >90 mL/min (ref >90)
GFR calc non Af Amer: 84 mL/min — ABNORMAL LOW (ref >90)

## 2013-01-06 LAB — CBC
MCH: 28.8 pg (ref 26.0–34.0)
MCHC: 33.6 g/dL (ref 30.0–36.0)
RDW: 15.6 % — ABNORMAL HIGH (ref 11.5–15.5)

## 2013-01-06 LAB — TSH: TSH: 0.831 u[IU]/mL (ref 0.350–4.500)

## 2013-01-06 NOTE — Progress Notes (Signed)
Report given to Mardella Layman, Charity fundraiser. All questions answered.  Onuchukwu, Paris Lore, RN

## 2013-01-06 NOTE — Progress Notes (Signed)
Physical Therapy Evaluation Patient Details Name: Sierra [name] MRN: [id number] DOB: 1929/02/20 Today's Date: 01/06/2013 Time: 4098-1191 PT Time Calculation (min): 22 min  PT Assessment / Plan / Recommendation History of Present Illness  Pt admit with lower GIB.  Clinical Impression  Pt admitted with lower GIB. Pt currently with functional limitations due to the deficits listed below (see PT Problem List). Pt Should progress and not need any HH f/u.  Close to baseline per pt.  Pt aware that she may need to use cane initially upon d/c.  Husband will provide 24 hour care.   Pt will benefit from skilled PT to increase their independence and safety with mobility to allow discharge to the venue listed below.    PT Assessment  Patient needs continued PT services    Follow Up Recommendations  No PT follow up                Equipment Recommendations  None recommended by PT         Frequency Min 3X/week    Precautions / Restrictions Precautions Precautions: Fall Restrictions Weight Bearing Restrictions: No   Pertinent Vitals/Pain VSS, No pain      Mobility  Bed Mobility Bed Mobility: Rolling Right;Right Sidelying to Sit Rolling Right: 7: Independent Right Sidelying to Sit: 7: Independent Transfers Transfers: Sit to Stand;Stand to Sit Sit to Stand: 7: Independent Stand to Sit: 7: Independent Ambulation/Gait Ambulation/Gait Assistance: 4: Min guard Ambulation Distance (Feet): 600 Feet Assistive device: None Ambulation/Gait Assistance Details: Pt ambulated without device around unit.  Pt with unsteady gait at times which pt states is her baseline.  Pt did not significantly lose her balance and she ambulated better the further she went.   Gait Pattern: Step-through pattern;Narrow base of support Stairs: No Wheelchair Mobility Wheelchair Mobility: No         PT Diagnosis: Generalized weakness  PT Problem List: Decreased balance;Decreased mobility;Decreased knowledge of  use of DME;Decreased knowledge of precautions;Decreased safety awareness PT Treatment Interventions: DME instruction;Gait training;Stair training;Functional mobility training;Therapeutic activities;Therapeutic exercise;Balance training;Patient/family education     PT Goals(Current goals can be found in the care plan section) Acute Rehab PT Goals Patient Stated Goal: to go home PT Goal Formulation: With patient Time For Goal Achievement: 01/13/13 Potential to Achieve Goals: Good  Visit Information  Last PT Received On: 01/06/13 Assistance Needed: +1 History of Present Illness: Pt admit with lower GIB.       Prior Functioning  Home Living Family/patient expects to be discharged to:: Private residence Living Arrangements: Spouse/significant other Available Help at Discharge: Family;Available 24 hours/day Type of Home: House Home Access: Stairs to enter [employer] of Steps: 1 Entrance Stairs-Rails: None Home Layout: One level Home Equipment: Cane - single point Prior Function Level of Independence: Independent Comments: Pt states she had a brain tumor resected years ago and that her balance has been a little off since that time.   Communication Communication: No difficulties    Cognition  Cognition Arousal/Alertness: Awake/alert Behavior During Therapy: WFL for tasks assessed/performed Overall Cognitive Status: Within Functional Limits for tasks assessed    Extremity/Trunk Assessment Upper Extremity Assessment Upper Extremity Assessment: Defer to OT evaluation Lower Extremity Assessment Lower Extremity Assessment: Overall WFL for tasks assessed Cervical / Trunk Assessment Cervical / Trunk Assessment: Normal   Balance Balance Balance Assessed: Yes Static Standing Balance Static Standing - Balance Support: No upper extremity supported;During functional activity Static Standing - Level of Assistance: 5: Stand by assistance Standardized Balance  Assessment Standardized Balance Assessment: Dynamic Gait Index Dynamic Gait Index Level Surface: Normal Change in Gait Speed: Normal Gait with Horizontal Head Turns: Normal Gait with Vertical Head Turns: Normal Gait and Pivot Turn: Normal Step Over Obstacle: Mild Impairment Step Around Obstacles: Normal Steps: Mild Impairment Total Score: 22  End of Session PT - End of Session Equipment Utilized During Treatment: Gait belt Activity Tolerance: Patient limited by fatigue Patient left: in chair;with call bell/phone within reach;with family/visitor present Nurse Communication: Mobility status  GP Functional Assessment Tool Used: Clinical judgement Functional Limitation: Mobility: Walking and moving around Mobility: Walking and Moving Around Current Status (B1478): At least 1 percent but less than 20 percent impaired, limited or restricted Mobility: Walking and Moving Around Goal Status [phone]): 0 percent impaired, limited or restricted   INGOLD,Shyah Cadmus 01/06/2013, 3:14 PM [facility] [phone] (pager)

## 2013-01-06 NOTE — Progress Notes (Signed)
Utilization review completed.  

## 2013-01-06 NOTE — Progress Notes (Signed)
[facility] Progress Note [place] TEAM 1 - Stepdown/ICU TEAM   Sierra [name] ZOX:[id number] DOB: 1928/06/21 DOA: 01/05/2013 PCP: Minda Meo, MD  Brief narrative: Sierra [name] is a 79 y.o. female, history of diverticulitis, mention of ulcerative colitis in chart, on 5 mg of prednisone chronically, history of partial colectomy 12 years ago patient says this was due to diverticulitis, hypertension, dyslipidemia, hypothyroidism who was in her usual state of health and doing well until about 6 AM this morning when she started experiencing bright red colored stools per rectum, she had about 5 or 6 episodes of bloody bowel movements at home she then presented to the ER where she had 2 more bright red bowel movements, she felt weak all over, she was hemodynamically stable, GI was consulted who saw the patient and requested hospital is to admit the patient. Patient's primary care physician was also informed and he requested hospital is to admit the patient.   Assessment/Plan: Principal Problem:   LGI bleed- extensive diverticulosis - s/p EGD and Colonoscopy - suspected to be diverticular (originating from mid transverse colon as evidenced by clotted blood in that area) and abated for now - advance diet to low residue tomorrow if no further bleeding per GI and watch for re-bleed - await fungal smear for exudate in esophagus that was biopsied - consult IR for angio if rebleed - d/c IVF and encourage PO liquid intake  Active Problems:    ANEMIA, due to acute blood loss - Hgb baseline appears to be 10-11 - hgb was 14 on admission dropping to 9.2 today - repeat cBC in AM     HYPERTENSION - holding antihypertensives due to hypotension and acute volume loss - follow for resumption tomorrow    Asthma with bronchitis - stable    ULCERATIVE COLITIS - cont daily prednisone    HYPOTHYROIDISM - cont synthroid    Code Status: full code Family Communication: with  husband Disposition Plan: transfer to med surg  Consultants: GI  Surgery  Procedures: EGD and Colonoscopy  Antibiotics: none  DVT prophylaxis: SCD  HPI/Subjective: Pt sitting on side of bed eating clear liquids- has been up to commode- no dizziness- no bleeding since admission   Objective: Blood pressure 137/71, pulse 76, temperature 97.5 F (36.4 C), temperature source Oral, resp. rate 18, height 5\' 3"  (1.6 m), weight 57.607 kg (127 lb), SpO2 99.00%.  Intake/Output Summary (Last 24 hours) at 01/06/13 1604 Last data filed at 01/06/13 1200  Gross per 24 hour  Intake 1286.75 ml  Output   1900 ml  Net -613.25 ml     Exam: General: No acute respiratory distress Lungs: Clear to auscultation bilaterally without wheezes or crackles Cardiovascular: Regular rate and rhythm without murmur gallop or rub normal S1 and S2 Abdomen: Nontender, nondistended, soft, bowel sounds positive, no rebound, no ascites, no appreciable mass Extremities: No significant cyanosis, clubbing, or edema bilateral lower extremities  Data Reviewed: Basic Metabolic Panel:  Recent Labs Lab 01/05/13 0810 01/06/13 0500  NA 140 141  K 3.3* 3.6  CL 103 109  CO2 25 24  GLUCOSE 175* 102*  BUN 15 10  CREATININE 0.70 0.58  CALCIUM 9.3 8.2*   Liver Function Tests:  Recent Labs Lab 01/05/13 0810  AST 16  ALT 16  ALKPHOS 37*  BILITOT 0.3  PROT 6.3  ALBUMIN 3.1*   No results found for this basename: LIPASE, AMYLASE,  in the last 168 hours No results found for this basename: AMMONIA,  in  the last 168 hours CBC:  Recent Labs Lab 01/05/13 0810 01/05/13 1059 01/05/13 1602 01/05/13 1831 01/05/13 2224 01/06/13 0500  WBC 14.1*  --   --   --   --  11.9*  NEUTROABS 10.7*  --   --   --   --   --   HGB 11.6* 10.0* 10.3* 9.8* 9.2* 9.2*  HCT 35.2* 30.3* 31.1* 29.7* 27.3* 27.4*  MCV 85.6  --   --   --   --  85.6  PLT 259  --   --   --   --  241   Cardiac Enzymes: No results found for this  basename: CKTOTAL, CKMB, CKMBINDEX, TROPONINI,  in the last 168 hours BNP (last 3 results) No results found for this basename: PROBNP,  in the last 8760 hours CBG: No results found for this basename: GLUCAP,  in the last 168 hours  Recent Results (from the past 240 hour(s))  CULTURE, FUNGUS WITHOUT SMEAR     Status: None   Collection Time    01/05/13  1:17 PM      Result Value Range Status   Specimen Description ESOPHAGUS   Final   Special Requests BRUSHING   Final   Culture     Final   Value: CULTURE IN PROGRESS FOR FOUR WEEKS     Performed at [facility]   Report Status PENDING   Incomplete  MRSA PCR SCREENING     Status: None   Collection Time    01/05/13  2:33 PM      Result Value Range Status   MRSA by PCR NEGATIVE  NEGATIVE Final   Comment:            The GeneXpert MRSA Assay (FDA     approved for NASAL specimens     only), is one component of a     comprehensive MRSA colonization     surveillance program. It is not     intended to diagnose MRSA     infection nor to guide or     monitor treatment for     MRSA infections.     Studies:  Recent x-ray studies have been reviewed in detail by the Attending Physician  Scheduled Meds:  Scheduled Meds: . fluticasone  1 puff Inhalation BID  . levothyroxine  75 mcg Oral QAC breakfast  . LORazepam  0.5 mg Oral QHS  . pantoprazole (PROTONIX) IV  40 mg Intravenous Q24H  . predniSONE  5 mg Oral Daily  . sodium chloride  3 mL Intravenous Q12H   Continuous Infusions:   Time spent on care of this patient: 35 min   Calvert Cantor, MD  [facility] Office  [phone] Pager - Text Page per Loretha Stapler as per below:  On-Call/Text Page:      [email or web address]      password [email or web address]  If 7PM-7AM, please contact night-coverage [email or web address] Password [email or web address] 01/06/2013, 4:04 PM   LOS: 1 day

## 2013-01-06 NOTE — Progress Notes (Signed)
I have seen and examined the pt and agree with PA-Reibock's progress note. No further bleeding from rectum. Would recommend IR emoblization as first step if rebleeds. Call back if needed.

## 2013-01-06 NOTE — Progress Notes (Signed)
Feels great. No bloody bowel movements for the past 24 hours. Vital signs satisfactory. Abdomen nontender. Asking to eat. Hemoglobin has leveled off at 9.2.  Fungal smear from yesterday's endoscopy, which showed a white exudate in the esophagus, is still pending.  Impression:  1. Diverticular hemorrhage, quiescent 2. Posthemorrhagic anemia, acute 3. Exudate in the esophagus, probably artifactual from Cetacaine spray  Plan:  1. Check CBC every 12 hours 2. Advance to clear liquid diet. Consider a low residue diet starting tomorrow if she remains free of bleeding. 3. Keep in mind that diverticular hemorrhage can sometimes recur after a day or 2 of quiescence,, so I would plan to observe the patient in house for at least 2 more days  4. Await fungal smear results. If Candida is present (doubt), would treat with nystatin solution or fluconazole. 5. Dr. Dorena Cookey will be covering for [place] this weekend.  Sierra [name], M.D. [phone]

## 2013-01-06 NOTE — Progress Notes (Signed)
1 Day Post-Op  Subjective: Pt has not had any BM since yesterday at about 9am.  Denies SOB, N/V.  Objective: Vital signs in last 24 hours: Temp:  [97.6 F (36.4 C)-98.3 F (36.8 C)] 98.2 F (36.8 C) (08/29 0800) Pulse Rate:  [77-81] 80 (08/29 0400) Resp:  [11-26] 19 (08/29 0400) BP: (92-129)/(37-78) 124/72 mmHg (08/29 0400) SpO2:  [93 %-100 %] 98 % (08/29 0757) Last BM Date:  (per pt prior to admission)  Intake/Output from previous day: 08/28 0701 - 08/29 0700 In: 1394.3 [P.O.:120; I.V.:1274.3] Out: 1150 [Urine:1150] Intake/Output this shift:    General appearance: alert, cooperative, appears stated age and no distress Cardio: regular rate and rhythm, S1, S2 normal, no murmur, click, rub or gallop GI: soft, non-tender; bowel sounds normal; no masses,  no organomegaly Extremities: extremities normal, atraumatic, no cyanosis or edema  Lab Results:   Recent Labs  01/05/13 0810  01/05/13 2224 01/06/13 0500  WBC 14.1*  --   --  11.9*  HGB 11.6*  < > 9.2* 9.2*  HCT 35.2*  < > 27.3* 27.4*  PLT 259  --   --  241  < > = values in this interval not displayed. BMET  Recent Labs  01/05/13 0810 01/06/13 0500  NA 140 141  K 3.3* 3.6  CL 103 109  CO2 25 24  GLUCOSE 175* 102*  BUN 15 10  CREATININE 0.70 0.58  CALCIUM 9.3 8.2*   PT/INR  Recent Labs  01/05/13 0810 01/05/13 1602  LABPROT 13.9 14.0  INR 1.09 1.10    Assessment/Plan: Chronic corticosteroid therapy(5mg  daily) Lower GI Bleed Diverticulosis  Ulcerative colitis  -Endoscopy negative, colonoscopy did not show active bleeding, appears to have come from midportion of transverse colon, moderate diverticulosis. -h&h trending down, but likely reflects yesterday episode of ABL. Does no appear to be actively bleeding. Agree with current plan of observation, IR for embolization if bleeding reoccurs.  Surgery will continue to follow   LOS: 1 day   Bonner Puna [facility] ANP-BC Pager [phone]  01/06/2013 9:04 AM

## 2013-01-07 LAB — CBC
MCH: 28.1 pg (ref 26.0–34.0)
Platelets: 261 10*3/uL (ref 150–400)
RBC: 3.34 MIL/uL — ABNORMAL LOW (ref 3.87–5.11)
RDW: 15.6 % — ABNORMAL HIGH (ref 11.5–15.5)
WBC: 9.4 10*3/uL (ref 4.0–10.5)

## 2013-01-07 MED ORDER — PANTOPRAZOLE SODIUM 40 MG PO TBEC
40.0000 mg | DELAYED_RELEASE_TABLET | Freq: Every day | ORAL | Status: DC
  Administered 2013-01-07 – 2013-01-08 (×2): 40 mg via ORAL
  Filled 2013-01-07 (×2): qty 1

## 2013-01-07 MED ORDER — AMLODIPINE BESYLATE 2.5 MG PO TABS
2.5000 mg | ORAL_TABLET | Freq: Every day | ORAL | Status: DC
  Administered 2013-01-07 – 2013-01-09 (×3): 2.5 mg via ORAL
  Filled 2013-01-07 (×4): qty 1

## 2013-01-07 NOTE — Progress Notes (Addendum)
[facility] Progress Note  Subjective: Patient is feeling okay, no stool since her proceed wants to advance diet  Objective: Vital signs in last 24 hours: Temp:  [97.4 F (36.3 C)-98.2 F (36.8 C)] 97.4 F (36.3 C) (08/30 0547) Pulse Rate:  [76-81] 77 (08/30 0547) Resp:  [15-18] 18 (08/30 0547) BP: (112-144)/(57-71) 144/63 mmHg (08/30 0547) SpO2:  [96 %-99 %] 97 % (08/30 0547) Weight:  [61.871 kg (136 lb 6.4 oz)] 61.871 kg (136 lb 6.4 oz) (08/30 0547) Weight change: 4.264 kg (9 lb 6.4 oz)   PE: Abdomen  Lab Results: Results for orders placed during the hospital encounter of 01/05/13 (from the past 24 hour(s))  CBC     Status: Abnormal   Collection Time    01/07/13  5:45 AM      Result Value Range   WBC 9.4  4.0 - 10.5 K/uL   RBC 3.34 (*) 3.87 - 5.11 MIL/uL   Hemoglobin 9.4 (*) 12.0 - 15.0 g/dL   HCT 16.1 (*) 09.6 - 04.5 %   MCV 85.6  78.0 - 100.0 fL   MCH 28.1  26.0 - 34.0 pg   MCHC 32.9  30.0 - 36.0 g/dL   RDW 40.9 (*) 81.1 - 91.4 %   Platelets 261  150 - 400 K/uL    Studies/Results: Endoscopic brushings for Candida pending   Assessment: GI bleed likely diverticular, seems to have stopped  Plan: Advance diet await brushings for candidiasis which appears unlikely. Await next stool. Discharge when stools nonbloody and hemoglobin stable.    Vartan Kerins C 01/07/2013, 7:40 AM

## 2013-01-07 NOTE — Progress Notes (Signed)
[facility] PROGRESS NOTE  Sierra [name] ZOX:[id number] DOB: [birth year] DOA: 01/05/2013 PCP: Minda Meo, MD  Brief narrative:  79 y.o. female, history of diverticulitis, ulcerative colitison on 5 mg of prednisone chronically, history of partial colectomy 12 years ago (patient says this was due to diverticulitis), hypertension, dyslipidemia, hypothyroidism who was in her usual state of health and doing well until about 6 AM on the day of admission  when she started experiencing bright red colored stools per rectum, she had about 5 or 6 episodes of bloody bowel movements at home she then presented to the ER where she had 2 more bright red bowel movements, she felt weak all over, she was hemodynamically stable, GI was consulted who saw the patient and requested hospitaist admit the patient.   Assessment/Plan:  Principal Problem:  LGI bleed - s/p EGD and Colonoscopy  - suspected to be diverticular (originating from mid transverse colon as evidenced by clotted blood in that area) and abated for now  H&H has been stable - advance diet to low residue - await fungal smear for exudate in esophagus that was biopsied  - consult IR for angio if rebleed  -[facility] following  Active Problems:  acute blood loss anemia - Hgb baseline appears to be 10-11  - hgb was 14 on admission dropped to 9.2 on 8/29. Stable at 9.4 today. dosnot need transfusion at this time.   - repeat CBC in AM   Hypertension - holding antihypertensives due to hypotension and acute volume loss  -resumed home dose amlodipine today  Asthma with bronchitis  - stable   Ulcerative colitis - cont daily prednisone   Hypothyroidism - cont synthroid    Diet: low residue  DVT prophylaxis: SCD   Code Status: full code Family Communication: Husband at bedside Disposition Plan: home likely in 2 days if no further GI bleed and hb stable   Consultants:  [facility]  Procedures:  EGD/ colonoscopy on  8/28  Antibiotics:  NONE  HPI/Subjective: Patient seen and examined this am. Husband at bedside. denies any symptoms. No melena or BRBPR  Objective: Filed Vitals:   01/07/13 0547  BP: 144/63  Pulse: 77  Temp: 97.4 F (36.3 C)  Resp: 18    Intake/Output Summary (Last 24 hours) at 01/07/13 1156 Last data filed at 01/07/13 0900  Gross per 24 hour  Intake    520 ml  Output    400 ml  Net    120 ml   Filed Weights   01/05/13 0703 01/07/13 0547  Weight: 57.607 kg (127 lb) 61.871 kg (136 lb 6.4 oz)    Exam:   General:  Elderly female in NAD  HEENT: no pallor, moist mucosa  Chest: clear b/l   CVS: NS1&S2, no murmurs,rubs or gallop  Abd: soft, NT, ND, BS+  Ext: Warm, no edema   cns: aaox3   Data Reviewed: Basic Metabolic Panel:  Recent Labs Lab 01/05/13 0810 01/06/13 0500  NA 140 141  K 3.3* 3.6  CL 103 109  CO2 25 24  GLUCOSE 175* 102*  BUN 15 10  CREATININE 0.70 0.58  CALCIUM 9.3 8.2*   Liver Function Tests:  Recent Labs Lab 01/05/13 0810  AST 16  ALT 16  ALKPHOS 37*  BILITOT 0.3  PROT 6.3  ALBUMIN 3.1*   No results found for this basename: LIPASE, AMYLASE,  in the last 168 hours No results found for this basename: AMMONIA,  in the last 168 hours CBC:  Recent Labs Lab 01/05/13 0810  01/05/13 1602 01/05/13 1831 01/05/13 2224 01/06/13 0500 01/07/13 0545  WBC 14.1*  --   --   --   --  11.9* 9.4  NEUTROABS 10.7*  --   --   --   --   --   --   HGB 11.6*  < > 10.3* 9.8* 9.2* 9.2* 9.4*  HCT 35.2*  < > 31.1* 29.7* 27.3* 27.4* 28.6*  MCV 85.6  --   --   --   --  85.6 85.6  PLT 259  --   --   --   --  241 261  < > = values in this interval not displayed. Cardiac Enzymes: No results found for this basename: CKTOTAL, CKMB, CKMBINDEX, TROPONINI,  in the last 168 hours BNP (last 3 results) No results found for this basename: PROBNP,  in the last 8760 hours CBG: No results found for this basename: GLUCAP,  in the last 168 hours  Recent  Results (from the past 240 hour(s))  CULTURE, FUNGUS WITHOUT SMEAR     Status: None   Collection Time    01/05/13  1:17 PM      Result Value Range Status   Specimen Description ESOPHAGUS   Final   Special Requests BRUSHING   Final   Culture     Final   Value: CULTURE IN PROGRESS FOR FOUR WEEKS     Performed at [facility]   Report Status PENDING   Incomplete  MRSA PCR SCREENING     Status: None   Collection Time    01/05/13  2:33 PM      Result Value Range Status   MRSA by PCR NEGATIVE  NEGATIVE Final   Comment:            The GeneXpert MRSA Assay (FDA     approved for NASAL specimens     only), is one component of a     comprehensive MRSA colonization     surveillance program. It is not     intended to diagnose MRSA     infection nor to guide or     monitor treatment for     MRSA infections.     Studies: No results found.  Scheduled Meds: . amLODipine  2.5 mg Oral QPC breakfast  . fluticasone  1 puff Inhalation BID  . levothyroxine  75 mcg Oral QAC breakfast  . LORazepam  0.5 mg Oral QHS  . pantoprazole  40 mg Oral Q1200  . predniSONE  5 mg Oral Daily  . sodium chloride  3 mL Intravenous Q12H   Continuous Infusions:     Time spent: 25 minutes    Khamari Yousuf  [facility] Pager [phone]. If 7PM-7AM, please contact night-coverage at [email or web address], password [facility] 01/07/2013, 11:56 AM  LOS: 2 days

## 2013-01-08 LAB — CBC
HCT: 28.5 % — ABNORMAL LOW (ref 36.0–46.0)
Hemoglobin: 9.6 g/dL — ABNORMAL LOW (ref 12.0–15.0)
RDW: 15.5 % (ref 11.5–15.5)
WBC: 10.1 10*3/uL (ref 4.0–10.5)

## 2013-01-08 MED ORDER — BISMUTH SUBSALICYLATE 262 MG/15ML PO SUSP
10.0000 mL | Freq: Four times a day (QID) | ORAL | Status: DC | PRN
  Administered 2013-01-08: 10 mL via ORAL
  Filled 2013-01-08: qty 236

## 2013-01-08 MED ORDER — BISMUTH SUBSALICYLATE 262 MG/15ML PO SUSP: 10.0000 mL | ORAL | Status: DC | PRN

## 2013-01-08 NOTE — Progress Notes (Signed)
[facility] PROGRESS NOTE  Sierra [name]:[id number] DOB: [birth year] DOA: 01/05/2013 PCP: Minda Meo, MD  Brief narrative:  79 y.o. female, history of diverticulitis, ulcerative colitison on 5 mg of prednisone chronically, history of partial colectomy 12 years ago (patient says this was due to diverticulitis), hypertension, dyslipidemia, hypothyroidism who was in her usual state of health and doing well until about 6 AM on the day of admission when she started experiencing bright red colored stools per rectum, she had about 5 or 6 episodes of bloody bowel movements at home she then presented to the ER where she had 2 more bright red bowel movements, she felt weak all over, she was hemodynamically stable, GI was consulted who saw the patient and requested hospitaist admit the patient.   Assessment/Plan:  Principal Problem:  LGI bleed  - s/p EGD and Colonoscopy  - suspected to be diverticular (originating from mid transverse colon as evidenced by clotted blood in that area) and abated for now  H&H has been stable  - advanced diet to low residue  - await fungal smear for exudate in esophagus that was biopsied  - consult IR for angio if rebleed  -[facility] following   Active Problems:  acute blood loss anemia  - Hgb baseline appears to be 10-11  - hgb was 14 on admission dropped to 9.2 on 8/29. Stable at 9.6 today. dosnot need transfusion at this time.  - repeat CBC in AM   Hypertension  - holding antihypertensives due to hypotension and acute volume loss  -resumed home dose amlodipine   Asthma with bronchitis  - stable   Ulcerative colitis  - cont daily prednisone   Hypothyroidism  - cont synthroid    Diet: low residue   DVT prophylaxis: SCD   Code Status: full code   Family Communication: Husband at bedside  Disposition Plan: home tomorrow  if no further GI bleed and hb stable  Consultants:  [facility] Procedures:  EGD/ colonoscopy on 8/28 Antibiotics:   NONE  HPI/Subjective:  Patient seen and examined this am.reports 2 loose BM this am. No blood noted.  Objective: Filed Vitals:   01/08/13 0556  BP: 149/68  Pulse: 67  Temp: 97.6 F (36.4 C)  Resp: 16    Intake/Output Summary (Last 24 hours) at 01/08/13 1453 Last data filed at 01/07/13 1500  Gross per 24 hour  Intake    240 ml  Output      0 ml  Net    240 ml   Filed Weights   01/05/13 0703 01/07/13 0547 01/08/13 0556  Weight: 57.607 kg (127 lb) 61.871 kg (136 lb 6.4 oz) 61.508 kg (135 lb 9.6 oz)    Exam: General: Elderly female in NAD  HEENT: no pallor, moist mucosa  Chest: clear b/l  CVS: NS1&S2, no murmurs,rubs or gallop  Abd: soft, NT, ND, BS+  Ext: Warm, no edema  cns: aaox3   Data Reviewed: Basic Metabolic Panel:  Recent Labs Lab 01/05/13 0810 01/06/13 0500  NA 140 141  K 3.3* 3.6  CL 103 109  CO2 25 24  GLUCOSE 175* 102*  BUN 15 10  CREATININE 0.70 0.58  CALCIUM 9.3 8.2*   Liver Function Tests:  Recent Labs Lab 01/05/13 0810  AST 16  ALT 16  ALKPHOS 37*  BILITOT 0.3  PROT 6.3  ALBUMIN 3.1*   No results found for this basename: LIPASE, AMYLASE,  in the last 168 hours No results found for this basename:  AMMONIA,  in the last 168 hours CBC:  Recent Labs Lab 01/05/13 0810  01/05/13 1831 01/05/13 2224 01/06/13 0500 01/07/13 0545 01/08/13 0527  WBC 14.1*  --   --   --  11.9* 9.4 10.1  NEUTROABS 10.7*  --   --   --   --   --   --   HGB 11.6*  < > 9.8* 9.2* 9.2* 9.4* 9.6*  HCT 35.2*  < > 29.7* 27.3* 27.4* 28.6* 28.5*  MCV 85.6  --   --   --  85.6 85.6 85.6  PLT 259  --   --   --  241 261 280  < > = values in this interval not displayed. Cardiac Enzymes: No results found for this basename: CKTOTAL, CKMB, CKMBINDEX, TROPONINI,  in the last 168 hours BNP (last 3 results) No results found for this basename: PROBNP,  in the last 8760 hours CBG: No results found for this basename: GLUCAP,  in the last 168 hours  Recent Results (from  the past 240 hour(s))  CULTURE, FUNGUS WITHOUT SMEAR     Status: None   Collection Time    01/05/13  1:17 PM      Result Value Range Status   Specimen Description ESOPHAGUS   Final   Special Requests BRUSHING   Final   Culture     Final   Value: CULTURE IN PROGRESS FOR FOUR WEEKS     Performed at [facility]   Report Status PENDING   Incomplete  MRSA PCR SCREENING     Status: None   Collection Time    01/05/13  2:33 PM      Result Value Range Status   MRSA by PCR NEGATIVE  NEGATIVE Final   Comment:            The GeneXpert MRSA Assay (FDA     approved for NASAL specimens     only), is one component of a     comprehensive MRSA colonization     surveillance program. It is not     intended to diagnose MRSA     infection nor to guide or     monitor treatment for     MRSA infections.     Studies: No results found.  Scheduled Meds: . amLODipine  2.5 mg Oral QPC breakfast  . fluticasone  1 puff Inhalation BID  . levothyroxine  75 mcg Oral QAC breakfast  . LORazepam  0.5 mg Oral QHS  . pantoprazole  40 mg Oral Q1200  . predniSONE  5 mg Oral Daily  . sodium chloride  3 mL Intravenous Q12H   Continuous Infusions:      Time spent: 25 minutes    Tessia Kassin  [facility] Pager [phone] If 7PM-7AM, please contact night-coverage at [email or web address], password [facility] 01/08/2013, 2:53 PM  LOS: 3 days

## 2013-01-09 DIAGNOSIS — R5381 Other malaise: Secondary | ICD-10-CM

## 2013-01-09 LAB — CBC
Hemoglobin: 9.8 g/dL — ABNORMAL LOW (ref 12.0–15.0)
MCH: 28.6 pg (ref 26.0–34.0)
Platelets: 274 10*3/uL (ref 150–400)
RBC: 3.43 MIL/uL — ABNORMAL LOW (ref 3.87–5.11)
WBC: 9.8 10*3/uL (ref 4.0–10.5)

## 2013-01-09 MED ORDER — BISMUTH SUBSALICYLATE 262 MG/15ML PO SUSP: 15.0000 mL | Freq: Four times a day (QID) | ORAL | Status: DC | PRN

## 2013-01-09 NOTE — Discharge Summary (Signed)
Physician Discharge Summary  Sierra [name]:[id number] DOB: 05-31-1928 DOA: 01/05/2013  PCP: Minda Meo, MD  Admit date: 01/05/2013 Discharge date: 01/09/2013  Time spent: 40 minutes  Recommendations for Outpatient Follow-up:  1. Discharge home with outpatient PCP followup in one week. Please check hemoglobin during visit. 2. Followup with Dr. Randa Evens in 4 weeks  Discharge Diagnoses:  Principal Problem:   LGI bleed  Active Problems:   Acute blood loss anemia   HYPERLIPIDEMIA   ANEMIA, IRON DEFICIENCY, MICROCYTIC   HYPERTENSION   Asthma with bronchitis   Esophageal reflux   ULCERATIVE COLITIS   HYPOTHYROIDISM   Discharge Condition: Fair  Diet recommendation: low Residue  Filed Weights   01/07/13 0547 01/08/13 0556 01/09/13 0514  Weight: 61.871 kg (136 lb 6.4 oz) 61.508 kg (135 lb 9.6 oz) 61.602 kg (135 lb 12.9 oz)    History of present illness:  79 y.o. female, history of diverticulitis, ulcerative colitison on 5 mg of prednisone chronically, history of partial colectomy 12 years ago (patient says this was due to diverticulitis), hypertension, dyslipidemia, hypothyroidism who was in her usual state of health and doing well until about 6 AM on the day of admission when she started experiencing bright red colored stools per rectum, she had about 5 or 6 episodes of bloody bowel movements at home she then presented to the ER where she had 2 more bright red bowel movements, she felt weak all over, she was hemodynamically stable, GI was consulted who saw the patient and requested hospitaist admit the patient.    Hospital Course:  LGI bleed  - s/p EGD and Colonoscopy by [facility] - suspected to be diverticular (originating from mid transverse colon as evidenced by clotted blood in that area) and abated for now  H&H has been stable  - advanced diet to low residue  - await fungal smear for exudate in esophagus that was biopsied  -Patient has not had further GI bleed and  hemoglobin has been progressively improving to 9.8 today. She can be discharged home with followup with her PCP in one week to have her hemoglobin checked. -Patient instructed on calling her PCP or to turning to ED for any signs or symptoms of GI bleed, dizziness, lightheadedness, chest pain, palpitations , increased fatigue , hematemesis, melena or syncope. - Active Problems:  acute blood loss anemia  - Hgb baseline appears to be 10-11  - hgb was 14 on admission dropped to 9.2 on 8/29. Improved to 9.8 today.  Hypertension   -resumed home BP medications  Asthma with bronchitis  - stable   Ulcerative colitis  - cont daily prednisone   Hypothyroidism  - cont synthroid    Diet: low residue   Code Status: full code  Family Communication: none at bedside  Disposition Plan: home tomorrow if no further GI bleed and hb stable   Consultants:  [facility] Procedures:  EGD/ colonoscopy on 8/28 Antibiotics:  NONE    Discharge Exam: Filed Vitals:   01/09/13 1020  BP: 123/68  Pulse: 84  Temp: 97.7 F (36.5 C)  Resp: 18   General: Elderly female in NAD  HEENT: no pallor, moist mucosa  Chest: clear b/l  CVS: NS1&S2, no murmurs,rubs or gallop  Abd: soft, NT, ND, BS+  Ext: Warm, no edema  cns: aaox3   Discharge Instructions   Future Appointments Provider Department Dept Phone   02/09/2013 2:15 PM Waymon Budge, MD  [facility] [phone]  Medication List    STOP taking these medications       amoxicillin-clavulanate 875-125 MG per tablet  Commonly known as:  AUGMENTIN      TAKE these medications       acetaminophen 500 MG tablet  Commonly known as:  TYLENOL  Take 500 mg by mouth every 6 (six) hours as needed for pain.     AEROCHAMBER MV inhaler  Use as instructed with flovent  110 MCG ONE PUFF TWICE A DAY     albuterol 108 (90 BASE) MCG/ACT inhaler  Commonly known as:  PROVENTIL HFA;VENTOLIN HFA  Inhale 2 puffs into the lungs every 6 (six)  hours as needed for wheezing or shortness of breath.     amLODipine 2.5 MG tablet  Commonly known as:  NORVASC  Take 2.5 mg by mouth daily after breakfast.     B-complex with vitamin C tablet  Take 1 tablet by mouth daily.     Bion Tears 0.1-0.3 % Soln  Place 1 drop into both eyes as needed.     Biotin 5000 MCG Caps  Take 5,000 mcg by mouth daily.     bismuth subsalicylate 262 MG/15ML suspension  Commonly known as:  PEPTO BISMOL  Take 15 mLs by mouth every 6 (six) hours as needed for indigestion.     cholecalciferol 1000 UNITS tablet  Commonly known as:  VITAMIN D  Take 1,000 Units by mouth daily.     fexofenadine 180 MG tablet  Commonly known as:  ALLEGRA  Take 60-180 mg by mouth daily as needed. Allergies     fluticasone 110 MCG/ACT inhaler  Commonly known as:  FLOVENT HFA  Inhale 1 puff into the lungs 2 (two) times daily.     levothyroxine 75 MCG tablet  Commonly known as:  SYNTHROID, LEVOTHROID  Take 75 mcg by mouth daily before breakfast.     LORazepam 0.5 MG tablet  Commonly known as:  ATIVAN  Take 0.5 mg by mouth daily as needed.     multivitamin tablet  Take 1 tablet by mouth daily.     omeprazole 20 MG capsule  Commonly known as:  PRILOSEC  Take 20 mg by mouth every other day.     ondansetron 4 MG disintegrating tablet  Commonly known as:  ZOFRAN-ODT  Take 1 tablet (4 mg total) by mouth every 8 (eight) hours as needed for nausea.     predniSONE 5 MG tablet  Commonly known as:  DELTASONE  Take 5 mg by mouth daily.       Allergies  Allergen Reactions  . Codeine Nausea And Vomiting  . Doxycycline     Very nauseated  . Restasis [Cyclosporine] Other (See Comments)    Eyes burn   . Ciprofloxacin Rash  . Phenytoin Rash  . Sulfonamide Derivatives Rash       Follow-up Information   Follow up with ARONSON,RICHARD A, MD In 1 week. (please check cbc for H&H)    Specialty:  Internal Medicine   Contact information:   [address] [facility]  [facility] [place] [address] [phone]       Follow up with EDWARDS JR,JAMES L, MD. Schedule an appointment as soon as possible for a visit in 4 weeks.   Specialty:  Gastroenterology   Contact information:   [address]  [place] [address] [phone]        The results of significant diagnostics from this hospitalization (including imaging, microbiology, ancillary and laboratory) are listed below for reference.    Significant Diagnostic Studies: Ct Soft Tissue Neck W Contrast  12/18/2012   *RADIOLOGY REPORT*  Clinical Data: Swelling.  Neck pain.  CT NECK WITH CONTRAST  Technique:  Multidetector CT imaging of the neck was performed with intravenous contrast.  Contrast: OMNIPAQUE IOHEXOL 300 MG/ML  SOLN  Comparison: No comparison neck CT.  Findings: Dilated submandibular ducts with 3.9 mm stone within the proximal left submandibular duct.  Submandibular gland is enlarged and inflamed.  Surrounding inflammation of fat planes.  No extra glandular drainable abscess identified.  Evaluation of the full course of the submandibular duct is somewhat limited by dental artifact however, no secondary submandibular duct stone is noted.  The left submandibular gland enlargement and surrounding inflammation deforms the left supraglottic region however, no discrete mass in this region seen separate from the inflammatory process.  Appearance of prior right thyroidectomy.  Within the left lobe of the thyroid gland, there is a 5.2 mm low density lesion.  Cervical spondylotic changes with reversal of the normal cervical lordosis and spinal stenosis most prominent C4-5.  Moderate to large sized left paracentral T1-2 extruded disc protrusion with left-sided cord flattening.  Atherosclerotic type changes aortic arch and left subclavian artery.  No significant narrowing of the proximal carotid arteries.  IMPRESSION:  Dilated submandibular  ducts with 3.9 mm stone within the proximal left submandibular duct.  Submandibular gland is enlarged and inflamed.  Surrounding inflammation of fat planes.  No extra glandular drainable abscess identified.  Appearance of prior right thyroidectomy.  Within the left lobe of the thyroid gland, there is a 5.2 mm low density lesion.  Cervical spondylotic changes with reversal of the normal cervical lordosis and spinal stenosis most prominent C4-5.  Moderate to large sized left paracentral T1-2 extruded disc protrusion with left-sided cord flattening.  Critical Value/emergent results were called by telephone at the time of interpretation on 12/18/2012 at 3:10 p.m. to Dr. Cleta Alberts, who verbally acknowledged these results.   Original Report Authenticated By: Lacy Duverney, M.D.    Microbiology: Recent Results (from the past 240 hour(s))  CULTURE, FUNGUS WITHOUT SMEAR     Status: None   Collection Time    01/05/13  1:17 PM      Result Value Range Status   Specimen Description ESOPHAGUS   Final   Special Requests BRUSHING   Final   Culture     Final   Value: CULTURE IN PROGRESS FOR FOUR WEEKS     Performed at [facility]   Report Status PENDING   Incomplete  MRSA PCR SCREENING     Status: None   Collection Time    01/05/13  2:33 PM      Result Value Range Status   MRSA by PCR NEGATIVE  NEGATIVE Final   Comment:            The GeneXpert MRSA Assay (FDA     approved for NASAL specimens     only), is one component of a     comprehensive MRSA colonization     surveillance program. It is not     intended to diagnose MRSA     infection nor to guide or     monitor treatment for     MRSA infections.     Labs: Basic Metabolic Panel:  Recent Labs Lab 01/05/13 0810 01/06/13 0500  NA 140 141  K 3.3* 3.6  CL 103 109  CO2 25 24  GLUCOSE 175* 102*  BUN 15 10  CREATININE 0.70 0.58  CALCIUM 9.3 8.2*   Liver Function Tests:  Recent Labs Lab 01/05/13 0810  AST 16  ALT 16  ALKPHOS 37*   BILITOT 0.3  PROT 6.3  ALBUMIN 3.1*   No results found for this basename: LIPASE, AMYLASE,  in the last 168 hours No results found for this basename: AMMONIA,  in the last 168 hours CBC:  Recent Labs Lab 01/05/13 0810  01/05/13 2224 01/06/13 0500 01/07/13 0545 01/08/13 0527 01/09/13 0544  WBC 14.1*  --   --  11.9* 9.4 10.1 9.8  NEUTROABS 10.7*  --   --   --   --   --   --   HGB 11.6*  < > 9.2* 9.2* 9.4* 9.6* 9.8*  HCT 35.2*  < > 27.3* 27.4* 28.6* 28.5* 29.5*  MCV 85.6  --   --  85.6 85.6 85.6 86.0  PLT 259  --   --  241 261 280 274  < > = values in this interval not displayed. Cardiac Enzymes: No results found for this basename: CKTOTAL, CKMB, CKMBINDEX, TROPONINI,  in the last 168 hours BNP: BNP (last 3 results) No results found for this basename: PROBNP,  in the last 8760 hours CBG: No results found for this basename: GLUCAP,  in the last 168 hours     Signed:  Debborah Alonge  [facility] 01/09/2013, 11:42 AM

## 2013-01-11 ENCOUNTER — Encounter: Payer: Self-pay | Admitting: Radiology

## 2013-01-11 ENCOUNTER — Telehealth: Payer: Self-pay | Admitting: Radiology

## 2013-01-11 NOTE — Telephone Encounter (Signed)
Spoke with patient and gave results when she called back; patient will take copy to primary doctor Patient just got out of hospital because of an internal bleed.  She is doing better now and will address thyroid lesion and salivary gland (possible stone)

## 2013-01-30 LAB — CULTURE, FUNGUS WITHOUT SMEAR

## 2013-02-09 ENCOUNTER — Ambulatory Visit (INDEPENDENT_AMBULATORY_CARE_PROVIDER_SITE_OTHER): Payer: Medicare Other | Admitting: Internal Medicine

## 2013-02-09 ENCOUNTER — Encounter: Payer: Self-pay | Admitting: Internal Medicine

## 2013-02-09 VITALS — BP 110/74 | HR 87 | Ht 64.0 in | Wt 132.2 lb

## 2013-02-09 DIAGNOSIS — J441 Chronic obstructive pulmonary disease with (acute) exacerbation: Secondary | ICD-10-CM

## 2013-02-09 NOTE — Progress Notes (Signed)
[name], female    DOB: April 26, 1929, 79 y.o.   MRN: [id number]  HPI 10/27/10- 81 yoF followed for asthma complicated by allergic rhinitis, bronchitis, GERD, hx of atypical AFB. Allergy vaccine- Dr Sierra [name]. Last here -June 27, 2010- PFTs reviewed at that visit. Since then has done very well with no significant flares. She feels getting away from winter viruses was key.  She just had blepharoplasty.   02/18/11-  70 yoF followed for asthma complicated by allergic rhinitis, bronchitis, GERD, hx of atypical AFB. Allergy vaccine- Dr Sierra [name]. She has gotten to the summer and early fall feeling very well. Really minimal chest tightness or nasal congestion. She has not been needing her nebulizer meds or rescue inhaler. We discussed the change in delivery system for Combivent. She will get one more of the current style to keep available. She has not been needing antihistamines or nasal spray.  04/27/11-  79 yoF followed for asthma complicated by allergic rhinitis, bronchitis, GERD, hx of atypical AFB. Allergy vaccine- Dr Sierra [name]. Hx brain tumor/ adrenal insufficiency.  Husband here. Has had flu vaccine. Did well until 10 days to 2 weeks ago. She had overnight onset of cough but seemed to improve until last night when she again got acutely ill with cough, clear foamy sputum, decreased appetite, sore throat, muscle aches. Temperature at home today was 100. She continues prednisone 5 mg daily for adrenal insufficiency after treatment for brain tumor. Due for hip replacement in March.  05/15/11-  69 yoF followed for asthma complicated by allergic rhinitis, bronchitis, GERD, hx of atypical AFB. Allergy vaccine- Dr Sierra [name]. Hx brain tumor/ adrenal insufficiency.  Husband is with her. She continues to wheeze with the exacerbation for which we saw her December 17. Sputum was cultured but is now foamy and white. She denies fever as she finishes amoxicillin. Sleeping on 3 pillows. Her nebulizer machine  made her too nervous. She had originally taken the Zithromax pack before amoxicillin. Doxycycline caused nausea and she had to stop. She has gone to 2 or 3 separate social functions that she felt she had to attend although she remains very tight in the chest with much cough and shortness of breath.  07/10/11-  62 yoF followed for asthma complicated by allergic rhinitis, bronchitis, GERD, hx of atypical AFB. Allergy vaccine- Dr Sierra [name]. Hx brain tumor/ adrenal insufficiency.  Pending hip replacement March 18. Feeling mild chest and head congestion over the past 2 days with increased cough. Last night was wheezing. Has not needed her nebulizer machine and denies fever or malaise. Husband just had colon cancer resected.   Acute OV 07/21/11 -- 79 yo woman, hx asthma that has been labile and affected by allergies, GERD. 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 Presents today noting . She was just treated with Augmentin starting 3/8, finished a pred taper from 3/1 back down to chronic 5mg  qd. She is on flovent, combivent prn, nebulized albuterol. Also fexofenedine and omeprazole.  She has had bouts of severe cough, UA noise, wheezing. No fevers, chills, sputum, CP. She is having PND. No GERD sx.  PHARYNGITIS, CHRONIC Flaring following apparent URI and resistant to rx with pred taper and also abx. Exacerbating factors are PND and GERD. She is taking allegra prn, omeprazole qd. Will try to ramp up rx of both factors to see if we can calm her throat down. - Start NSW qd - continue allegra - increase omeprazole to bid temporarily - continue pred 5 and finish  the augmentin - not clear that this is her asthma, sounds like all UA symptoms, although she confuses the two and calls it all asthma. It would be tempting to give her a break from Flovent to see if it would calm her throat down, since this is the driving problem at this time. She will continue for now. Asked her to use her nebs prn and not on a schedule.  - she  will call later this week to update on her progress ROV Dr Sierra [name] in 3 -4 weeks  08/25/11- 81 yoF followed for asthma complicated by allergic rhinitis, bronchitis, GERD, hx of atypical AFB. Allergy vaccine- Dr Sierra [name]. Hx brain tumor/ adrenal insufficiency. PCP Dr Sierra [name]  [name],fatigue deneis any wheezing, cough  .having  dizzy spells  more frequent. Had left knee replacement March 18 under spinal anesthesia. Wheezed immediately before surgery and was given a nebulizer treatment preop. Since then has done very well and says she feels well today. Did have lightheadedness or dizziness which was evaluated with EKG and CT scan. History of meningioma 11 years ago.  10/09/2011 Acute OV  Complains of cough with clear mucus, wheezing, DOE, decreased energy x1week  Barky cough,  Exposed to paint fumes and cough started.  No fever or discolored mucus.  No hemoptysis , no edema.   11/10/11- 22 yoF followed for asthma complicated by allergic rhinitis, bronchitis, GERD, hx of atypical AFB. Hx brain tumor/ adrenal insufficiency. PCP Dr Sierra [name] Was seen by a nurse practitioner in May and treated with Augmentin and prednisone increased to 10 mg. She is now back to 5 mg of prednisone daily. Staying indoors to avoid the weather. Short of breath since left hip replacement surgery in March. Occasional sneeze. She dropped off of Dr. Rush [name] allergy vaccine. Cough produces scant phlegm. Denies chest pain. She says recent echocardiogram and EKG were "fine". Dyspnea on exertion. She just stopped to rest making her bed.  CXR- 07/15/11-  IMPRESSION:  Mild hyperinflation. No active disease.  Original Report Authenticated By: Sierra [name], M.D.   03/14/12- 81 yoF followed for asthma complicated by allergic rhinitis, bronchitis, GERD, hx of atypical AFB. Hx brain tumor/ adrenal insufficiency. PCP Dr Sierra [name]    husband here FOLLOWS FOR: still having alot of cough-thick foamy light green in  color; afraid to take Avelox after reading about side effects because her right shoulder is still healing.  Acute illness started as a cold. Now deep rattle comes and goes, worse over the past 3 weeks but she sleeps well. Coughs out "clear foam". She remains on prednisone 5 mg daily.  04/29/12- 37 yoF followed for asthma complicated by allergic rhinitis, bronchitis, GERD, hx of atypical AFB. Hx brain tumor/ adrenal insufficiency. PCP Dr Sierra [name]    husband here ACUTE VISIT: went to Neuro MD yesterday for dizzy spells and unable to walk straight-told  to follow up here for Increased SOB-states she can tell her O2 gets low; feels weak and dizzy; not using Combivent-feels like it burns her throat and needs Flovent refilled She is worried she may have a recurrence of her brain tumor. At her primary physician's office yesterday for prednisone was increased to 15 mg twice daily for 3 days then 10 mg twice daily for 3 days then 5 mg twice daily x3 days, then back to her 5 mg daily maintenance. She says a chest x-ray was done "no pneumonia" CXR 03/14/12 IMPRESSION:  No active disease. Mild hyperinflation again  noted.  Original Report Authenticated By: Natasha Mead, M.D.   05/16/12-  FOLLOWS FOR: pt states her breathing has gotten worse since last OV. reports that her breathing is good in the AM but after lunch is worsens. lots of coughing and wheezing.  Husband here "Bad December. " She describes coughing paroxysms and feels well in between. No effect of cough syrup except that it helps her sleep. Husband hears crackling sounds when she is lying down, breathing quietly. She was on a prednisone taper back to her maintenance 5 mg daily. Finished Augmentin last night. Using Combivent 4 times daily. Tussive soreness upper anterior chest wall. Scant productive white foamy sputum. No fever. Watery mucus from her nose triggered by coughing. She had 2 chest x-rays in November and one in December. One of them at her primary  physician. All described as clear CT chest 08/21/2011 from [facility] shows some mild emphysema and arterial calcifications but otherwise negative.  06/13/12- 83 yoF followed for asthma complicated by allergic rhinitis, bronchitis, GERD, hx of atypical AFB. Hx brain tumor/ adrenal insufficiency. PCP Dr Jacky Kindle   FOLLOWS FOR: still has productive cough at times; wonders in allergies are starting to flare up again-sneezing. Sneezing and watery rhinorrhea which she attributes to "allergy". Dry cough is much improved compared with last visit. She finished her Biaxin 2 weeks ago. Continues prednisone 5 mg daily maintenance, plus her Flovent inhaler and uses rescue inhaler once or twice a day when needed. Sputum culture from 05/16/2012-normal flora. AFB smear negative.  08/09/12- 83 yoF followed for asthma complicated by allergic rhinitis, bronchitis, GERD, hx of atypical AFB. Hx brain tumor/ adrenal insufficiency. PCP Dr Jacky Kindle  FOLLOWS ZOX:WRUEAV any SOB, wheezing, cough, or congestion today. Would like to discuss Dymista(has been using as needed)-noticed it caused dry mouth before. Feels as comfortable with her breathing now if she ever gets. Occasional use of rescue inhaler but no significant cough or wheeze recently.  10/14/12- 3 yoF followed for asthma complicated by allergic rhinitis, bronchitis, GERD, hx of atypical AFB. Hx brain tumor/ adrenal insufficiency. PCP Dr Jacky Kindle   Husband here ACUTE VISIT: Started feeling bad Wednesday last week; Thursday started wheezing, cough, SOB, fevers of 100's, not wanting to eat-nothing to eat today. She had done too much in yard work for 2 days. For the past 8 or 10 days has felt acutely ill with cough especially bad for the past 2 days. Deep cough, exhausted. Nebulizer does not help. Dr Jacky Kindle also wanted to see if increasing maintenance prednisone would help her chronic dizziness.  02/09/13- 3 yoF followed for asthma complicated by allergic rhinitis,  bronchitis, GERD, hx of atypical AFB. Hx brain tumor/ adrenal insufficiency. PCP Dr Jacky Kindle   Husband here FOLLOWS WUJ:[id number] she has had a few flare ups since last visit(used rescue inhaler maybe 3 times) Had flu vaccine We reviewed her recent hospitalization- salivary stone surgery complicated by bleeding diverticulum. Dymista helps sneeze and rhinorhea.  ROS-see HPI Constitutional:   No-   weight loss, night sweats, +fevers, chills, fatigue, lassitude. HEENT:   No-  headaches, difficulty swallowing, tooth/dental problems, sore throat,       +sneezing, itching, ear ache, +nasal congestion, no-post nasal drip,  CV:  No-   chest pain, orthopnea, PND, swelling in lower extremities, anasarca, dizziness, palpitations Resp: + improved shortness of breath with exertion or at rest.              No- productive cough, no- non-productive cough,  No- coughing up  of blood.              No-   change in color of mucus.  No- wheezing.   Skin: No-   rash or lesions. GI:  No-   heartburn, indigestion, abdominal pain, nausea, vomiting,  GU:  MS:  No-   joint pain or swelling.  . Neuro-     nothing unusual Psych:  No- change in mood or affect. No depression or anxiety.  No memory loss.  OBJ- Physical Exam General- Alert, Oriented, Affect-appropriate, Distress- none acute.  Skin- rash-none, lesions- none, excoriation- none Lymphadenopathy- none Head- atraumatic            Eyes- Gross vision intact, PERRLA, conjunctivae and secretions clear            Ears- Hearing, canals-normal            Nose- Clear, no-Septal dev, mucus, polyps, erosion, perforation             Throat- Mallampati II , mucosa clear , drainage- none, tonsils- atrophic. A little hoarse with throat clearing. Neck- flexible , trachea midline, stridor-none, thyroid nl, carotid no bruit Chest - symmetrical excursion , unlabored           Heart/CV- RRR , no murmur , no gallop  , no rub, nl s1 s2                           - JVD- none ,  edema- none, stasis changes- none, varices- none           Lung-  +deep cough with some wheeze , dullness-none, rub- none           Chest wall-  Abd-  Br/ Gen/ Rectal- Not done, not indicated Extrem- cyanosis- none, clubbing, none, atrophy- none, strength- nl Neuro- grossly intact to observation

## 2013-02-09 NOTE — Patient Instructions (Signed)
You might find that cinnamon could help suppress yeast in your esophagus. Dr Randa Evens may have suggestion beyond regular mouth rinsing for preventing thrush.  You can use your rescue inhaler before exercise as needed to prevent exertional wheeze.

## 2013-05-30 ENCOUNTER — Other Ambulatory Visit: Payer: Self-pay | Admitting: Internal Medicine

## 2013-06-08 DIAGNOSIS — D329 Benign neoplasm of meninges, unspecified: Secondary | ICD-10-CM | POA: Insufficient documentation

## 2013-06-13 ENCOUNTER — Encounter: Payer: Self-pay | Admitting: Internal Medicine

## 2013-06-13 ENCOUNTER — Ambulatory Visit (INDEPENDENT_AMBULATORY_CARE_PROVIDER_SITE_OTHER): Payer: Medicare Other | Admitting: Internal Medicine

## 2013-06-13 VITALS — BP 114/62 | HR 85 | Ht 64.0 in | Wt 128.6 lb

## 2013-06-13 DIAGNOSIS — J309 Allergic rhinitis, unspecified: Secondary | ICD-10-CM

## 2013-06-13 DIAGNOSIS — J300 Vasomotor rhinitis: Secondary | ICD-10-CM

## 2013-06-13 DIAGNOSIS — J45909 Unspecified asthma, uncomplicated: Secondary | ICD-10-CM

## 2013-06-13 MED ORDER — PREDNISONE 10 MG PO TABS: ORAL_TABLET | ORAL | Status: DC

## 2013-06-13 MED ORDER — METHYLPREDNISOLONE ACETATE 80 MG/ML IJ SUSP
80.0000 mg | Freq: Once | INTRAMUSCULAR | Status: AC
  Administered 2013-06-13: 80 mg via INTRAMUSCULAR

## 2013-06-13 MED ORDER — LEVALBUTEROL HCL 0.63 MG/3ML IN NEBU
0.6300 mg | INHALATION_SOLUTION | Freq: Once | RESPIRATORY_TRACT | Status: AC
  Administered 2013-06-13: 0.63 mg via RESPIRATORY_TRACT

## 2013-06-13 MED ORDER — CLARITHROMYCIN 500 MG PO TABS: ORAL_TABLET | ORAL | Status: DC

## 2013-06-13 MED ORDER — HYDROCODONE-HOMATROPINE 5-1.5 MG/5ML PO SYRP: 5.0000 mL | ORAL_SOLUTION | Freq: Four times a day (QID) | ORAL | Status: DC | PRN

## 2013-06-13 NOTE — Patient Instructions (Signed)
Neb xop 0.63  Depo 80  Script sent for biaxin antibiotic, prednisone taper till you get back down to 5 mg daily  Script printed for cough syrup

## 2013-06-13 NOTE — Progress Notes (Signed)
[name], female    DOB: April 26, 1929, 80 y.o.   MRN: [id number]  HPI 10/27/10- 81 yoF followed for asthma complicated by allergic rhinitis, bronchitis, GERD, hx of atypical AFB. Allergy vaccine- Dr Donneta Romberg. Last here -June 27, 2010- PFTs reviewed at that visit. Since then has done very well with no significant flares. She feels getting away from winter viruses was key.  She just had blepharoplasty.   02/18/11-  70 yoF followed for asthma complicated by allergic rhinitis, bronchitis, GERD, hx of atypical AFB. Allergy vaccine- Dr Donneta Romberg. She has gotten to the summer and early fall feeling very well. Really minimal chest tightness or nasal congestion. She has not been needing her nebulizer meds or rescue inhaler. We discussed the change in delivery system for Combivent. She will get one more of the current style to keep available. She has not been needing antihistamines or nasal spray.  04/27/11-  79 yoF followed for asthma complicated by allergic rhinitis, bronchitis, GERD, hx of atypical AFB. Allergy vaccine- Dr Donneta Romberg. Hx brain tumor/ adrenal insufficiency.  Husband here. Has had flu vaccine. Did well until 10 days to 2 weeks ago. She had overnight onset of cough but seemed to improve until last night when she again got acutely ill with cough, clear foamy sputum, decreased appetite, sore throat, muscle aches. Temperature at home today was 100. She continues prednisone 5 mg daily for adrenal insufficiency after treatment for brain tumor. Due for hip replacement in March.  05/15/11-  69 yoF followed for asthma complicated by allergic rhinitis, bronchitis, GERD, hx of atypical AFB. Allergy vaccine- Dr Donneta Romberg. Hx brain tumor/ adrenal insufficiency.  Husband is with her. She continues to wheeze with the exacerbation for which we saw her December 17. Sputum was cultured but is now foamy and white. She denies fever as she finishes amoxicillin. Sleeping on 3 pillows. Her nebulizer machine  made her too nervous. She had originally taken the Zithromax pack before amoxicillin. Doxycycline caused nausea and she had to stop. She has gone to 2 or 3 separate social functions that she felt she had to attend although she remains very tight in the chest with much cough and shortness of breath.  07/10/11-  62 yoF followed for asthma complicated by allergic rhinitis, bronchitis, GERD, hx of atypical AFB. Allergy vaccine- Dr Donneta Romberg. Hx brain tumor/ adrenal insufficiency.  Pending hip replacement March 18. Feeling mild chest and head congestion over the past 2 days with increased cough. Last night was wheezing. Has not needed her nebulizer machine and denies fever or malaise. Husband just had colon cancer resected.   Acute OV 07/21/11 -- 80 yo woman, hx asthma that has been labile and affected by allergies, GERD. 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 Presents today noting . She was just treated with Augmentin starting 3/8, finished a pred taper from 3/1 back down to chronic 5mg  qd. She is on flovent, combivent prn, nebulized albuterol. Also fexofenedine and omeprazole.  She has had bouts of severe cough, UA noise, wheezing. No fevers, chills, sputum, CP. She is having PND. No GERD sx.  PHARYNGITIS, CHRONIC Flaring following apparent URI and resistant to rx with pred taper and also abx. Exacerbating factors are PND and GERD. She is taking allegra prn, omeprazole qd. Will try to ramp up rx of both factors to see if we can calm her throat down. - Start NSW qd - continue allegra - increase omeprazole to bid temporarily - continue pred 5 and finish  the augmentin - not clear that this is her asthma, sounds like all UA symptoms, although she confuses the two and calls it all asthma. It would be tempting to give her a break from Flovent to see if it would calm her throat down, since this is the driving problem at this time. She will continue for now. Asked her to use her nebs prn and not on a schedule.  - she  will call later this week to update on her progress ROV Dr Annamaria Boots in 3 -4 weeks  08/25/11- 81 yoF followed for asthma complicated by allergic rhinitis, bronchitis, GERD, hx of atypical AFB. Allergy vaccine- Dr Donneta Romberg. Hx brain tumor/ adrenal insufficiency. PCP Dr [name]  [name],fatigue deneis any wheezing, cough  .having  dizzy spells  more frequent. Had left knee replacement March 18 under spinal anesthesia. Wheezed immediately before surgery and was given a nebulizer treatment preop. Since then has done very well and says she feels well today. Did have lightheadedness or dizziness which was evaluated with EKG and CT scan. History of meningioma 11 years ago.  10/09/2011 Acute OV  Complains of cough with clear mucus, wheezing, DOE, decreased energy x1week  Barky cough,  Exposed to paint fumes and cough started.  No fever or discolored mucus.  No hemoptysis , no edema.   11/10/11- 22 yoF followed for asthma complicated by allergic rhinitis, bronchitis, GERD, hx of atypical AFB. Hx brain tumor/ adrenal insufficiency. PCP Dr [name] Was seen by a nurse practitioner in May and treated with Augmentin and prednisone increased to 10 mg. She is now back to 5 mg of prednisone daily. Staying indoors to avoid the weather. Short of breath since left hip replacement surgery in March. Occasional sneeze. She dropped off of Dr. Rush Landmark allergy vaccine. Cough produces scant phlegm. Denies chest pain. She says recent echocardiogram and EKG were "fine". Dyspnea on exertion. She just stopped to rest making her bed.  CXR- 07/15/11-  IMPRESSION:  Mild hyperinflation. No active disease.  Original Report Authenticated By: Raelyn Number, M.D.   03/14/12- 81 yoF followed for asthma complicated by allergic rhinitis, bronchitis, GERD, hx of atypical AFB. Hx brain tumor/ adrenal insufficiency. PCP Dr [name]    husband here FOLLOWS FOR: still having alot of cough-thick foamy light green in  color; afraid to take Avelox after reading about side effects because her right shoulder is still healing.  Acute illness started as a cold. Now deep rattle comes and goes, worse over the past 3 weeks but she sleeps well. Coughs out "clear foam". She remains on prednisone 5 mg daily.  04/29/12- 37 yoF followed for asthma complicated by allergic rhinitis, bronchitis, GERD, hx of atypical AFB. Hx brain tumor/ adrenal insufficiency. PCP Dr [name]    husband here ACUTE VISIT: went to Neuro MD yesterday for dizzy spells and unable to walk straight-told  to follow up here for Increased SOB-states she can tell her O2 gets low; feels weak and dizzy; not using Combivent-feels like it burns her throat and needs Flovent refilled She is worried she may have a recurrence of her brain tumor. At her primary physician's office yesterday for prednisone was increased to 15 mg twice daily for 3 days then 10 mg twice daily for 3 days then 5 mg twice daily x3 days, then back to her 5 mg daily maintenance. She says a chest x-ray was done "no pneumonia" CXR 03/14/12 IMPRESSION:  No active disease. Mild hyperinflation again  noted.  Original Report Authenticated By: Lahoma Crocker, M.D.   05/16/12-  FOLLOWS FOR: pt states her breathing has gotten worse since last OV. reports that her breathing is good in the AM but after lunch is worsens. lots of coughing and wheezing.  Husband here "Bad December. " She describes coughing paroxysms and feels well in between. No effect of cough syrup except that it helps her sleep. Husband hears crackling sounds when she is lying down, breathing quietly. She was on a prednisone taper back to her maintenance 5 mg daily. Finished Augmentin last night. Using Combivent 4 times daily. Tussive soreness upper anterior chest wall. Scant productive white foamy sputum. No fever. Watery mucus from her nose triggered by coughing. She had 2 chest x-rays in November and one in December. One of them at her primary  physician. All described as clear CT chest 08/21/2011 from [facility] shows some mild emphysema and arterial calcifications but otherwise negative.  06/13/12- 30 yoF followed for asthma complicated by allergic rhinitis, bronchitis, GERD, hx of atypical AFB. Hx brain tumor/ adrenal insufficiency. PCP Dr [name]   FOLLOWS FOR: still has productive cough at times; wonders in allergies are starting to flare up again-sneezing. Sneezing and watery rhinorrhea which she attributes to "allergy". Dry cough is much improved compared with last visit. She finished her Biaxin 2 weeks ago. Continues prednisone 5 mg daily maintenance, plus her Flovent inhaler and uses rescue inhaler once or twice a day when needed. Sputum culture from 05/16/2012-normal flora. AFB smear negative.  08/09/12- 45 yoF followed for asthma complicated by allergic rhinitis, bronchitis, GERD, hx of atypical AFB. Hx brain tumor/ adrenal insufficiency. PCP Dr [name]  FOLLOWS EXB:MWUXLK any SOB, wheezing, cough, or congestion today. Would like to discuss Dymista(has been using as needed)-noticed it caused dry mouth before. Feels as comfortable with her breathing now if she ever gets. Occasional use of rescue inhaler but no significant cough or wheeze recently.  10/14/12- 3 yoF followed for asthma complicated by allergic rhinitis, bronchitis, GERD, hx of atypical AFB. Hx brain tumor/ adrenal insufficiency. PCP Dr [name]   Husband here ACUTE VISIT: Started feeling bad Wednesday last week; Thursday started wheezing, cough, SOB, fevers of 100's, not wanting to eat-nothing to eat today. She had done too much in yard work for 2 days. For the past 8 or 10 days has felt acutely ill with cough especially bad for the past 2 days. Deep cough, exhausted. Nebulizer does not help. Dr [name] also wanted to see if increasing maintenance prednisone would help her chronic dizziness.  02/09/13- 3 yoF followed for asthma complicated by allergic rhinitis,  bronchitis, GERD, hx of atypical AFB. Hx brain tumor/ adrenal insufficiency. PCP Dr [name]   Husband here FOLLOWS GMW:NUUVOZ she has had a few flare ups since last visit(used rescue inhaler maybe 3 times) Had flu vaccine We reviewed her recent hospitalization- salivary stone surgery complicated by bleeding diverticulum. Dymista helps sneeze and rhinorhea.  06/13/13-84 yoF followed for asthma complicated by allergic rhinitis, bronchitis, GERD, hx of atypical AFB. Hx brain tumor/ adrenal insufficiency. PCP Dr [name]   Husband here FOLLOWS FOR: pt is currently having cough-productive-green; no fevers, runny nose and sneezing. Has been going on since Saturday. Good winter until now. Cough bad cold from husband going from head to chest. Sneeze, drippy nose, productive cough with light green sputum, no fever or pain. Prednisone 5 mg daily maintenance. Hospital for GI bleed in August then had ophthalmic artery CVA, then had treatment for sialithiasis.  ROS-see HPI Constitutional:   No-   weight loss, night sweats, +fevers, chills, fatigue, lassitude. HEENT:   No-  headaches, difficulty swallowing, tooth/dental problems, sore throat,       +sneezing, itching, ear ache, +nasal congestion, no-post nasal drip,  CV:  No-   chest pain, orthopnea, PND, swelling in lower extremities, anasarca, dizziness, palpitations Resp: +  shortness of breath with exertion or at rest.              +productive cough, no- non-productive cough,  No- coughing up of blood.              +  change in color of mucus.  No- wheezing.   Skin: No-   rash or lesions. GI:  No-   heartburn, indigestion, abdominal pain, nausea, vomiting,  GU:  MS:  No-   joint pain or swelling.  . Neuro-     nothing unusual Psych:  No- change in mood or affect. No depression or anxiety.  No memory loss.  OBJ- Physical Exam General- Alert, Oriented, Affect-appropriate, Distress- none acute. Wearing mask.  Skin- rash-none, lesions- none, excoriation-  none Lymphadenopathy- none Head- atraumatic            Eyes- Gross vision intact, PERRLA, conjunctivae and secretions clear            Ears- Hearing, canals-normal            Nose- Clear, no-Septal dev, mucus, polyps, erosion, perforation             Throat- Mallampati II , mucosa clear , drainage- none, tonsils- atrophic. A little hoarse with throat clearing. Neck- flexible , trachea midline, stridor-none, thyroid nl, carotid no bruit Chest - symmetrical excursion , unlabored           Heart/CV- RRR , no murmur , no gallop  , no rub, nl s1 s2                           - JVD- none , edema- none, stasis changes- none, varices- none           Lung-  +deep cough with some wheeze , dullness-none, rub- none           Chest wall-  Abd-  Br/ Gen/ Rectal- Not done, not indicated Extrem- cyanosis- none, clubbing, none, atrophy- none, strength- nl Neuro- grossly intact to observation

## 2013-06-14 ENCOUNTER — Telehealth: Payer: Self-pay | Admitting: Internal Medicine

## 2013-06-14 MED ORDER — ALBUTEROL SULFATE (5 MG/ML) 0.5% IN NEBU
2.5000 mg | INHALATION_SOLUTION | Freq: Four times a day (QID) | RESPIRATORY_TRACT | Status: DC | PRN
Start: 2013-06-14 — End: 2013-06-14

## 2013-06-14 MED ORDER — ALBUTEROL SULFATE (2.5 MG/3ML) 0.083% IN NEBU: 2.5000 mg | INHALATION_SOLUTION | Freq: Four times a day (QID) | RESPIRATORY_TRACT | Status: DC | PRN

## 2013-06-14 NOTE — Telephone Encounter (Signed)
Pt is aware that Rx has been fixed; was sent as 0.5% and not 0.083% as she usually gets; I have corrected this in EPIC and with pharmacy.

## 2013-06-14 NOTE — Telephone Encounter (Signed)
Spoke with pt. Her albuterol neb has expired. Needs new RX sent to pharmacy to have on hand. i have done so. Nothing further needed

## 2013-06-20 ENCOUNTER — Telehealth: Payer: Self-pay | Admitting: Internal Medicine

## 2013-06-20 NOTE — Telephone Encounter (Signed)
Ok to stop the biaxin and watch to see how she feels. This was an intolerance to biaxin, not an allergy. If it were important enough, we could give it again.

## 2013-06-20 NOTE — Telephone Encounter (Signed)
Pt is aware of CY recs. Nothing further is needed.

## 2013-06-20 NOTE — Telephone Encounter (Signed)
Spoke with pt. She was given Biaxin at her last OV on 06/13/13. States that it's causing insomnia and has a bitter taste in her mouth. Want to know if she has to finish this, has 6 more tablets. Advised her not to take anymore until she hears back from [place].  CY - please advise.  **Biaxin has been added to pt's allergy list.**

## 2013-06-23 ENCOUNTER — Ambulatory Visit (INDEPENDENT_AMBULATORY_CARE_PROVIDER_SITE_OTHER): Payer: Medicare Other | Admitting: Adult Health

## 2013-06-23 ENCOUNTER — Inpatient Hospital Stay (HOSPITAL_COMMUNITY)
Admission: AD | Admit: 2013-06-23 | Discharge: 2013-06-27 | DRG: 191 | Disposition: A | Discharge status: Home / Self Care | Payer: Medicare Other | Source: Ambulatory Visit | Attending: Internal Medicine | Admitting: Internal Medicine

## 2013-06-23 ENCOUNTER — Encounter: Payer: Self-pay | Admitting: Adult Health

## 2013-06-23 ENCOUNTER — Encounter (HOSPITAL_COMMUNITY): Payer: Self-pay | Admitting: *Deleted

## 2013-06-23 ENCOUNTER — Other Ambulatory Visit: Payer: Self-pay

## 2013-06-23 ENCOUNTER — Inpatient Hospital Stay (HOSPITAL_COMMUNITY): Payer: Medicare Other

## 2013-06-23 ENCOUNTER — Telehealth: Payer: Self-pay | Admitting: Internal Medicine

## 2013-06-23 VITALS — BP 128/74 | HR 102 | Temp 98.0°F | Ht 64.0 in | Wt 127.2 lb

## 2013-06-23 DIAGNOSIS — M129 Arthropathy, unspecified: Secondary | ICD-10-CM | POA: Diagnosis present

## 2013-06-23 DIAGNOSIS — K219 Gastro-esophageal reflux disease without esophagitis: Secondary | ICD-10-CM | POA: Diagnosis present

## 2013-06-23 DIAGNOSIS — R0609 Other forms of dyspnea: Secondary | ICD-10-CM | POA: Diagnosis present

## 2013-06-23 DIAGNOSIS — J45901 Unspecified asthma with (acute) exacerbation: Secondary | ICD-10-CM | POA: Diagnosis present

## 2013-06-23 DIAGNOSIS — Z86011 Personal history of benign neoplasm of the brain: Secondary | ICD-10-CM

## 2013-06-23 DIAGNOSIS — Z9049 Acquired absence of other specified parts of digestive tract: Secondary | ICD-10-CM

## 2013-06-23 DIAGNOSIS — IMO0002 Reserved for concepts with insufficient information to code with codable children: Secondary | ICD-10-CM

## 2013-06-23 DIAGNOSIS — R0989 Other specified symptoms and signs involving the circulatory and respiratory systems: Secondary | ICD-10-CM | POA: Diagnosis present

## 2013-06-23 DIAGNOSIS — E2749 Other adrenocortical insufficiency: Secondary | ICD-10-CM | POA: Diagnosis present

## 2013-06-23 DIAGNOSIS — Z8249 Family history of ischemic heart disease and other diseases of the circulatory system: Secondary | ICD-10-CM

## 2013-06-23 DIAGNOSIS — J441 Chronic obstructive pulmonary disease with (acute) exacerbation: Principal | ICD-10-CM | POA: Diagnosis present

## 2013-06-23 DIAGNOSIS — E039 Hypothyroidism, unspecified: Secondary | ICD-10-CM | POA: Diagnosis present

## 2013-06-23 DIAGNOSIS — E785 Hyperlipidemia, unspecified: Secondary | ICD-10-CM | POA: Diagnosis present

## 2013-06-23 DIAGNOSIS — J209 Acute bronchitis, unspecified: Secondary | ICD-10-CM

## 2013-06-23 DIAGNOSIS — R05 Cough: Secondary | ICD-10-CM | POA: Diagnosis present

## 2013-06-23 DIAGNOSIS — R059 Cough, unspecified: Secondary | ICD-10-CM | POA: Diagnosis present

## 2013-06-23 DIAGNOSIS — I10 Essential (primary) hypertension: Secondary | ICD-10-CM | POA: Diagnosis present

## 2013-06-23 DIAGNOSIS — R0602 Shortness of breath: Secondary | ICD-10-CM

## 2013-06-23 DIAGNOSIS — Z96649 Presence of unspecified artificial hip joint: Secondary | ICD-10-CM

## 2013-06-23 LAB — CBC WITH DIFFERENTIAL/PLATELET
Basophils Absolute: 0 10*3/uL (ref 0.0–0.1)
Basophils Relative: 0 % (ref 0–1)
EOS ABS: 0.1 10*3/uL (ref 0.0–0.7)
Eosinophils Relative: 0 % (ref 0–5)
HCT: 38.5 % (ref 36.0–46.0)
Hemoglobin: 12.7 g/dL (ref 12.0–15.0)
LYMPHS ABS: 1 10*3/uL (ref 0.7–4.0)
Lymphocytes Relative: 6 % — ABNORMAL LOW (ref 12–46)
MCH: 26.6 pg (ref 26.0–34.0)
MCHC: 33 g/dL (ref 30.0–36.0)
MCV: 80.5 fL (ref 78.0–100.0)
MONOS PCT: 10 % (ref 3–12)
Monocytes Absolute: 1.8 10*3/uL — ABNORMAL HIGH (ref 0.1–1.0)
Neutro Abs: 15 10*3/uL — ABNORMAL HIGH (ref 1.7–7.7)
Neutrophils Relative %: 84 % — ABNORMAL HIGH (ref 43–77)
Platelets: 332 10*3/uL (ref 150–400)
RBC: 4.78 MIL/uL (ref 3.87–5.11)
RDW: 18.5 % — ABNORMAL HIGH (ref 11.5–15.5)
WBC: 17.9 10*3/uL — ABNORMAL HIGH (ref 4.0–10.5)

## 2013-06-23 LAB — COMPREHENSIVE METABOLIC PANEL
ALBUMIN: 4.3 g/dL (ref 3.5–5.2)
ALT: 34 U/L (ref 0–35)
AST: 30 U/L (ref 0–37)
Alkaline Phosphatase: 56 U/L (ref 39–117)
BUN: 24 mg/dL — ABNORMAL HIGH (ref 6–23)
CHLORIDE: 94 meq/L — AB (ref 96–112)
CO2: 25 mEq/L (ref 19–32)
CREATININE: 0.83 mg/dL (ref 0.50–1.10)
Calcium: 10.5 mg/dL (ref 8.4–10.5)
GFR calc Af Amer: 74 mL/min — ABNORMAL LOW (ref >90)
GFR calc non Af Amer: 64 mL/min — ABNORMAL LOW (ref >90)
Glucose, Bld: 111 mg/dL — ABNORMAL HIGH (ref 70–99)
POTASSIUM: 4.1 meq/L (ref 3.7–5.3)
SODIUM: 137 meq/L (ref 137–147)
Total Bilirubin: 0.3 mg/dL (ref 0.3–1.2)
Total Protein: 8.2 g/dL (ref 6.0–8.3)

## 2013-06-23 LAB — PRO B NATRIURETIC PEPTIDE: Pro B Natriuretic peptide (BNP): 125.3 pg/mL (ref 0–450)

## 2013-06-23 LAB — TROPONIN I: Troponin I: <0.3 ng/mL (ref <0.30)

## 2013-06-23 MED ORDER — AZELASTINE-FLUTICASONE 137-50 MCG/ACT NA SUSP: 1.0000 | Freq: Every day | NASAL | Status: DC

## 2013-06-23 MED ORDER — LEVOTHYROXINE SODIUM 75 MCG PO TABS
75.0000 ug | ORAL_TABLET | Freq: Every day | ORAL | Status: DC
  Administered 2013-06-24 – 2013-06-27 (×4): 75 ug via ORAL
  Filled 2013-06-23 (×5): qty 1

## 2013-06-23 MED ORDER — POLYSACCHARIDE IRON COMPLEX 150 MG PO CAPS: 150.0000 mg | ORAL_CAPSULE | Freq: Every day | ORAL | Status: DC

## 2013-06-23 MED ORDER — SODIUM CHLORIDE 0.9 % IJ SOLN
3.0000 mL | Freq: Two times a day (BID) | INTRAMUSCULAR | Status: DC
  Administered 2013-06-24 – 2013-06-26 (×5): 3 mL via INTRAVENOUS

## 2013-06-23 MED ORDER — SODIUM CHLORIDE 0.9 % IV SOLN: 250.0000 mL | INTRAVENOUS | Status: DC | PRN

## 2013-06-23 MED ORDER — AMLODIPINE BESYLATE 2.5 MG PO TABS
2.5000 mg | ORAL_TABLET | Freq: Every day | ORAL | Status: DC
  Administered 2013-06-24 – 2013-06-27 (×4): 2.5 mg via ORAL
  Filled 2013-06-23 (×4): qty 1

## 2013-06-23 MED ORDER — ACETAMINOPHEN 650 MG RE SUPP: 650.0000 mg | Freq: Four times a day (QID) | RECTAL | Status: DC | PRN

## 2013-06-23 MED ORDER — PANTOPRAZOLE SODIUM 40 MG PO TBEC
40.0000 mg | DELAYED_RELEASE_TABLET | Freq: Every day | ORAL | Status: DC
  Administered 2013-06-24: 40 mg via ORAL
  Filled 2013-06-23: qty 1

## 2013-06-23 MED ORDER — SODIUM CHLORIDE 0.9 % IJ SOLN
3.0000 mL | INTRAMUSCULAR | Status: DC | PRN
Start: 2013-06-23 — End: 2013-06-27

## 2013-06-23 MED ORDER — ALBUTEROL SULFATE (2.5 MG/3ML) 0.083% IN NEBU
2.5000 mg | INHALATION_SOLUTION | Freq: Four times a day (QID) | RESPIRATORY_TRACT | Status: DC
  Administered 2013-06-23 – 2013-06-27 (×15): 2.5 mg via RESPIRATORY_TRACT
  Filled 2013-06-23 (×16): qty 3

## 2013-06-23 MED ORDER — DEXTROSE 5 % IV SOLN
1.0000 g | Freq: Every day | INTRAVENOUS | Status: DC
  Administered 2013-06-23 – 2013-06-25 (×3): 1 g via INTRAVENOUS
  Filled 2013-06-23 (×4): qty 10

## 2013-06-23 MED ORDER — GUAIFENESIN ER 600 MG PO TB12
600.0000 mg | ORAL_TABLET | Freq: Two times a day (BID) | ORAL | Status: DC
  Administered 2013-06-23 – 2013-06-24 (×2): 600 mg via ORAL
  Filled 2013-06-23 (×3): qty 1

## 2013-06-23 MED ORDER — HYDROCODONE-HOMATROPINE 5-1.5 MG/5ML PO SYRP: 5.0000 mL | ORAL_SOLUTION | Freq: Four times a day (QID) | ORAL | Status: DC | PRN

## 2013-06-23 MED ORDER — AZELASTINE HCL 0.1 % NA SOLN
1.0000 | Freq: Every day | NASAL | Status: DC
  Administered 2013-06-24 – 2013-06-25 (×2): 1 via NASAL
  Filled 2013-06-23: qty 30

## 2013-06-23 MED ORDER — LORAZEPAM 0.5 MG PO TABS
0.5000 mg | ORAL_TABLET | Freq: Every day | ORAL | Status: DC | PRN
  Administered 2013-06-23 – 2013-06-26 (×4): 0.5 mg via ORAL
  Filled 2013-06-23 (×4): qty 1

## 2013-06-23 MED ORDER — DEXTROSE 5 % IV SOLN
500.0000 mg | Freq: Every day | INTRAVENOUS | Status: DC
  Administered 2013-06-23: 500 mg via INTRAVENOUS
  Filled 2013-06-23: qty 500

## 2013-06-23 MED ORDER — METHYLPREDNISOLONE SODIUM SUCC 125 MG IJ SOLR
80.0000 mg | Freq: Three times a day (TID) | INTRAMUSCULAR | Status: DC
  Administered 2013-06-23 – 2013-06-26 (×7): 80 mg via INTRAVENOUS
  Filled 2013-06-23 (×11): qty 1.28

## 2013-06-23 MED ORDER — HEPARIN SODIUM (PORCINE) 5000 UNIT/ML IJ SOLN
5000.0000 [IU] | Freq: Three times a day (TID) | INTRAMUSCULAR | Status: DC
  Administered 2013-06-23 – 2013-06-27 (×10): 5000 [IU] via SUBCUTANEOUS
  Filled 2013-06-23 (×14): qty 1

## 2013-06-23 MED ORDER — ONDANSETRON HCL 4 MG PO TABS: 4.0000 mg | ORAL_TABLET | Freq: Four times a day (QID) | ORAL | Status: DC | PRN

## 2013-06-23 MED ORDER — ONDANSETRON HCL 4 MG/2ML IJ SOLN: 4.0000 mg | Freq: Four times a day (QID) | INTRAMUSCULAR | Status: DC | PRN

## 2013-06-23 MED ORDER — ALBUTEROL SULFATE (2.5 MG/3ML) 0.083% IN NEBU: 2.5000 mg | INHALATION_SOLUTION | RESPIRATORY_TRACT | Status: DC | PRN

## 2013-06-23 MED ORDER — FLUTICASONE PROPIONATE 50 MCG/ACT NA SUSP
1.0000 | Freq: Every day | NASAL | Status: DC
  Administered 2013-06-23 – 2013-06-26 (×3): 1 via NASAL
  Filled 2013-06-23: qty 16

## 2013-06-23 MED ORDER — ACETAMINOPHEN 325 MG PO TABS: 650.0000 mg | ORAL_TABLET | Freq: Four times a day (QID) | ORAL | Status: DC | PRN

## 2013-06-23 NOTE — Telephone Encounter (Signed)
Called and spoke w/ pt. She is having to get up int he middle of night to do breathing tx. Last night had a terrible night. C/o prod cough w/ peach color phlem, wheezing, chest tx. Pt is very SOB. Per CDY okay to bring her in to see TP. Pt is scheduled at 4:15 today. Nothing further needed

## 2013-06-23 NOTE — H&P (Signed)
Name: Sierra [name] MRN: [id number] DOB: Mar 21, 1929    ADMISSION DATE:  06/23/13   PRIMARY SERVICE:  Pulmonary   CHIEF COMPLAINT:  Wheezing and Dyspnea is worse.   BRIEF PATIENT DESCRIPTION: 80 yo WF with asthma never smoker w/ refractory AEAB flare in OP setting, admitted from office 06/23/13 .   SIGNIFICANT EVENTS / STUDIES:  Admit from office 2/13   LINES / TUBES:   CULTURES:   ANTIBIOTICS: 2/13 Rocephin >> 2/13 Azithro >>  HISTORY OF PRESENT ILLNESS:  Patient has a known history of asthma, complicated by allergic rhinitis, bronchitis, GERD, hx of atypical AFB. Hx brain tumor/ adrenal insufficiency. Patient complains of a two-week history of persistent cough, congestion, with initial, green mucus, fever, or nasal congestion. Patient was seen in the office on February 3 given Biaxin and a prednisone taper. Unfortunately, patient was unable to take Biaxin do to generalized intolerance. She has finished prednisone without any improvement in her symptoms. Patient reports over the last 2-3 days. Cough and wheezing have worsened. Upon arrival to the office. Patient with significant wheezing that did not improve with the Xopenex nebulizer treatment Patient denies any hemoptysis, orthopnea, PND, or leg swelling.    PAST MEDICAL HISTORY :  Past Medical History  Diagnosis Date  . Ulcerative colitis   . Hypertension   . Hyperlipemia   . Diverticulitis, colon   . Other specified iron deficiency anemias   . Thrombocytosis   . Leukocytosis   . Esophageal reflux   . Chronic pharyngitis   . Allergic rhinitis   . Acute asthmatic bronchitis   . Hypothyroidism   . Cardiac murmur     DOES NOT CAUSE ANY SYMPTOMS  . Recurrent upper respiratory infection (URI)     ON 07/10/11 PT SAW DR. Tarri Fuller YOUNG FOR TX OF ACUTE BRONCHITIS --GIVEN  EXTRA PREDNISONE ( IN ADDITION TO THE DAILY PREDNISONE PT TAKES)  AND PT FINISHED A Z-PAK--STILL HAS A COUGH TODAY 07/15/11-NO FEVER.  Marland Kitchen Blood transfusion    . Arthritis     HX OF RT SHOULDER BURSITIS-AND THE SHOULDER IS HURTING AT PRESENT, PAIN IN LEFT KNEE AT PRESENT  AND PAIN IN LEFT HIP  . IBS (irritable bowel syndrome)   . Balance problem     SINCE BRAIN TUMOR REMOVED IN 2002-BENIGN TUMOR-PT HAS ADRENAL INSUFFICIENCY AND TAKES DAILY PREDNISONE  . GI bleed 01/06/2013   Past Surgical History  Procedure Laterality Date  . Partial colectomy  2008  . Total hip arthroplasty  OCT 2006    right  . Vesicovaginal fistula closure w/ tah    . Thyroidectomy  1986  . Appendectomy    . Craniotomy    . Subglottal mucocoel  2000  . Meningioma resected  20O2  . Frontalis suspension  10-09-2010    lifting eyelids AND SECOND EYE SURGERY November 19, 2010  . Tonsillectomy  1938  . Abdominal hysterectomy  1985  . Dilation and curettage of uterus  1967  . Eye surgery      2003 -RIGHT CATARACT EXTRACTED AND LEFT WAS DONE IN 2004  . Wrist tendon lesion removed 2007    . Total hip arthroplasty  07/27/2011    Procedure: TOTAL HIP ARTHROPLASTY;  Surgeon: Gearlean Alf, MD;  Location: [facility] ORS;  Service: Orthopedics;  Laterality: Left;  . Colonoscopy N/A 01/05/2013    Procedure: COLONOSCOPY;  Surgeon: Cleotis Nipper, MD;  Location: [facility] ENDOSCOPY;  Service: Endoscopy;  Laterality: N/A;  . Esophagogastroduodenoscopy N/A 01/05/2013  Procedure: ESOPHAGOGASTRODUODENOSCOPY (EGD);  Surgeon: Cleotis Nipper, MD;  Location: [facility] ENDOSCOPY;  Service: Endoscopy;  Laterality: N/A;   Prior to Admission medications   Medication Sig Start Date End Date Taking? Authorizing Provider  acetaminophen (TYLENOL) 500 MG tablet Take 500 mg by mouth every 6 (six) hours as needed for pain.    Historical Provider, MD  albuterol (PROVENTIL) (2.5 MG/3ML) 0.083% nebulizer solution Take 3 mLs (2.5 mg total) by nebulization every 6 (six) hours as needed for wheezing or shortness of breath. DX 493.90 06/14/13   Deneise Lever, MD  amLODipine (NORVASC) 2.5 MG tablet Take 2.5 mg by mouth daily  after breakfast.    Historical Provider, MD  Artificial Tear Solution (BION TEARS) 0.1-0.3 % SOLN Place 1 drop into both eyes as needed.    Historical Provider, MD  Azelastine-Fluticasone (DYMISTA) 137-50 MCG/ACT SUSP Place 1 spray into the nose at bedtime.    Historical Provider, MD  B Complex-C (B-COMPLEX WITH VITAMIN C) tablet Take 1 tablet by mouth daily.    Historical Provider, MD  Biotin 5000 MCG CAPS Take 5,000 mcg by mouth daily.    Historical Provider, MD  cholecalciferol (VITAMIN D) 1000 UNITS tablet Take 1,000 Units by mouth daily.    Historical Provider, MD  fexofenadine (ALLEGRA) 180 MG tablet Take 60-180 mg by mouth daily as needed. Allergies 07/09/11   Deneise Lever, MD  FLOVENT HFA 110 MCG/ACT inhaler USE 2 PUFFS BY MOUTH THEN RINSE TWICE DAILY 05/30/13   Deneise Lever, MD  HYDROcodone-homatropine [facility]) 5-1.5 MG/5ML syrup Take 5 mLs by mouth every 6 (six) hours as needed for cough. 06/13/13 06/13/14  Deneise Lever, MD  iron polysaccharides (NU-IRON) 150 MG capsule Take 150 mg by mouth daily.    Historical Provider, MD  levothyroxine (SYNTHROID, LEVOTHROID) 75 MCG tablet Take 75 mcg by mouth daily before breakfast.     Historical Provider, MD  LORazepam (ATIVAN) 0.5 MG tablet Take 0.5 mg by mouth daily as needed.     Historical Provider, MD  Multiple Vitamin (MULTIVITAMIN) tablet Take 1 tablet by mouth daily.    Historical Provider, MD  omeprazole (PRILOSEC) 20 MG capsule Take 20 mg by mouth every other day.    Historical Provider, MD  predniSONE (DELTASONE) 10 MG tablet Take 4 tabs daily for 3 days, 3 tabs daily for 3 days, 2 tabs daily for 3 days, 1 tab daily for 3 days 06/13/13   Deneise Lever, MD  predniSONE (DELTASONE) 5 MG tablet Take 5 mg by mouth daily.    Historical Provider, MD  Spacer/Aero-Holding Chambers (AEROCHAMBER MV) inhaler Use as instructed with flovent  [address] [place]    Historical Provider, MD   Allergies  Allergen Reactions  . Biaxin  [Clarithromycin]   . Codeine Nausea And Vomiting  . Doxycycline     Very nauseated  . Restasis [Cyclosporine] Other (See Comments)    Eyes burn   . Ciprofloxacin Rash  . Phenytoin Rash  . Sulfonamide Derivatives Rash    FAMILY HISTORY:  Family History  Problem Relation Age of Onset  . Heart attack Father     deceased   SOCIAL HISTORY:  reports that she has never smoked. She has never used smokeless tobacco. She reports that she drinks alcohol. She reports that she does not use illicit drugs.  REVIEW OF SYSTEMS:   Constitutional: No- weight loss, night sweats, +fevers, chills, fatigue, lassitude.  HEENT: No- headaches, difficulty swallowing, tooth/dental  problems, sore throat,  +sneezing, itching, ear ache, +nasal congestion, no-post nasal drip,  CV: No- chest pain, orthopnea, PND, swelling in lower extremities, anasarca, dizziness, palpitations  Resp: +  shortness of breath with exertion or at rest.    ++change in color of mucus. +++ wheezing.  Skin: No- rash or lesions.  GI: No- heartburn, indigestion, abdominal pain, nausea, vomiting,  GU:  MS: No- joint pain or swelling. .  Neuro- nothing unusual  Psych: No- change in mood or affect. No depression or anxiety. No memory loss.  SUBJECTIVE:  Wheezing and cough   VITAL SIGNS: Temp:  [98 F (36.7 C)] 98 F (36.7 C) (02/13 1652) Pulse Rate:  [102] 102 (02/13 1652) BP: (128)/(74) 128/74 mmHg (02/13 1652) SpO2:  [95 %] 95 % (02/13 1652) Weight:  [57.698 kg (127 lb 3.2 oz)] 57.698 kg (127 lb 3.2 oz) (02/13 1652)  PHYSICAL EXAMINATION:   GEN: A/Ox3; pleasant , NAD, elderly and frail  HEENT:  /AT,  EACs-clear, TMs-wnl, NOSE-clear, THROAT-clear, no lesions, no postnasal drip or exudate noted.   NECK:  Supple w/ fair ROM; no JVD; normal carotid impulses w/o bruits; no thyromegaly or nodules palpated; no lymphadenopathy.  RESP  Diffuse exp wheezing, no stridor  Speaking in full sentences , no accessory muscle use, no  dullness to percussion  CARD:  RRR, no m/r/g  , no peripheral edema, pulses intact, no cyanosis or clubbing.  GI:   Soft & nt; nml bowel sounds; no organomegaly or masses detected.  Musco: Warm bil, no deformities or joint swelling noted.   Neuro: alert, no focal deficits noted.  Anxious   Skin: Warm, no lesions or rashes   No results found for this basename: NA, K, CL, CO2, BUN, CREATININE, GLUCOSE,  in the last 168 hours No results found for this basename: HGB, HCT, WBC, PLT,  in the last 168 hours No results found.  ASSESSMENT / PLAN:  1. Asthmatic Bronchitis -refractory to OP therapy  Plan  Check cxr , labs  Begin IV steroids (on chronic steroid baseline)  Begin IV abx w/ rocephin/azithro  BD neb   2. HTN  Cont home meds     PARRETT,TAMMY NP-C  Pulmonary and [place] Pager: [phone]  06/23/2013, 5:24 PM

## 2013-06-23 NOTE — H&P (Signed)
Patient was examined by me in the office and evaluation, assessment and therapeutic plan were discussed with the NP. I agree this admission is medically necessary. She has recurrent exacerbations which are usually slow to respond to appropriate treatment.

## 2013-06-24 DIAGNOSIS — J45901 Unspecified asthma with (acute) exacerbation: Secondary | ICD-10-CM

## 2013-06-24 LAB — CBC
HEMATOCRIT: 38.4 % (ref 36.0–46.0)
Hemoglobin: 12.7 g/dL (ref 12.0–15.0)
MCH: 26.4 pg (ref 26.0–34.0)
MCHC: 33.1 g/dL (ref 30.0–36.0)
MCV: 79.8 fL (ref 78.0–100.0)
Platelets: 335 10*3/uL (ref 150–400)
RBC: 4.81 MIL/uL (ref 3.87–5.11)
RDW: 18.7 % — ABNORMAL HIGH (ref 11.5–15.5)
WBC: 15.5 10*3/uL — AB (ref 4.0–10.5)

## 2013-06-24 LAB — URINALYSIS, ROUTINE W REFLEX MICROSCOPIC
Bilirubin Urine: NEGATIVE
GLUCOSE, UA: 100 mg/dL — AB
HGB URINE DIPSTICK: NEGATIVE
Ketones, ur: NEGATIVE mg/dL
Leukocytes, UA: NEGATIVE
Nitrite: NEGATIVE
Protein, ur: NEGATIVE mg/dL
SPECIFIC GRAVITY, URINE: 1.009 (ref 1.005–1.030)
Urobilinogen, UA: 0.2 mg/dL (ref 0.0–1.0)
pH: 6 (ref 5.0–8.0)

## 2013-06-24 LAB — BASIC METABOLIC PANEL
BUN: 26 mg/dL — ABNORMAL HIGH (ref 6–23)
CHLORIDE: 97 meq/L (ref 96–112)
CO2: 25 meq/L (ref 19–32)
CREATININE: 0.73 mg/dL (ref 0.50–1.10)
Calcium: 10.1 mg/dL (ref 8.4–10.5)
GFR calc Af Amer: 90 mL/min — ABNORMAL LOW (ref >90)
GFR calc non Af Amer: 77 mL/min — ABNORMAL LOW (ref >90)
Glucose, Bld: 152 mg/dL — ABNORMAL HIGH (ref 70–99)
Potassium: 4.7 mEq/L (ref 3.7–5.3)
Sodium: 137 mEq/L (ref 137–147)

## 2013-06-24 LAB — GLUCOSE, CAPILLARY: Glucose-Capillary: 274 mg/dL — ABNORMAL HIGH (ref 70–99)

## 2013-06-24 LAB — TSH: TSH: 0.067 u[IU]/mL — ABNORMAL LOW (ref 0.350–4.500)

## 2013-06-24 MED ORDER — PANTOPRAZOLE SODIUM 40 MG PO TBEC
40.0000 mg | DELAYED_RELEASE_TABLET | Freq: Two times a day (BID) | ORAL | Status: DC
  Administered 2013-06-24 – 2013-06-27 (×6): 40 mg via ORAL
  Filled 2013-06-24 (×8): qty 1

## 2013-06-24 MED ORDER — AZITHROMYCIN 250 MG PO TABS
250.0000 mg | ORAL_TABLET | Freq: Every day | ORAL | Status: DC
  Administered 2013-06-24 – 2013-06-27 (×4): 250 mg via ORAL
  Filled 2013-06-24 (×4): qty 1

## 2013-06-24 MED ORDER — DM-GUAIFENESIN ER 30-600 MG PO TB12
2.0000 | ORAL_TABLET | Freq: Two times a day (BID) | ORAL | Status: DC
  Administered 2013-06-24 – 2013-06-27 (×7): 2 via ORAL
  Filled 2013-06-24 (×8): qty 2

## 2013-06-24 NOTE — H&P (Deleted)
   Name: Sierra [name] MRN: [id number] DOB: [birth year]    ADMISSION DATE:  06/23/13   PRIMARY SERVICE:  Pulmonary   CHIEF COMPLAINT:  Wheezing and Dyspnea  worse.   BRIEF PATIENT DESCRIPTION: 80 yo WF with asthma never smoker w/ refractory AEAB flare in OP setting, admitted from office 06/23/13 .       ANTIBIOTICS: 2/13 Rocephin >  2/13 Azithro >>   SUBJECTIVE/overnight:   Persistent hacking cough and chest tightness, no sob at rest RA  VITAL SIGNS: Temp:  [97.6 F (36.4 C)-98 F (36.7 C)] 97.6 F (36.4 C) (02/14 0600) Pulse Rate:  [82-102] 89 (02/14 0600) Resp:  [18] 18 (02/14 0600) BP: (128-151)/(65-74) 151/73 mmHg (02/14 0600) SpO2:  [93 %-96 %] 96 % (02/14 0747) Weight:  [122 lb (55.339 kg)-127 lb 3.2 oz (57.698 kg)] 123 lb (55.792 kg) (02/14 0600) FIO2  RA  PHYSICAL EXAMINATION:   GEN: A/Ox3; pleasant , NAD, elderly and frail wf with mod pseudowheeze HEENT:  [place]/AT,  EACs-clear, TMs-wnl, NOSE-clear, THROAT-clear, no lesions, no postnasal drip or exudate noted.   NECK:  Supple w/ fair ROM; no JVD; normal carotid impulses w/o bruits; no thyromegaly or nodules palpated; no lymphadenopathy.  RESP  Diffuse bilateral mid  exp wheezing, no stridor  Speaking in full sentences , no accessory muscle use, no dullness to percussion  CARD:  RRR, no m/r/g  , no peripheral edema, pulses intact, no cyanosis or clubbing.  GI:   Soft & nt; nml bowel sounds; no organomegaly or masses detected.  Musco: Warm bil, no deformities or joint swelling noted.        Recent Labs Lab 06/23/13 1840 06/24/13 0548  NA 137 137  K 4.1 4.7  CL 94* 97  CO2 25 25  BUN 24* 26*  CREATININE 0.83 0.73  GLUCOSE 111* 152*    Recent Labs Lab 06/23/13 1840 06/24/13 0548  HGB 12.7 12.7  HCT 38.5 38.4  WBC 17.9* 15.5*  PLT 332 335   X-ray Chest Pa And Lateral   06/23/2013   CLINICAL DATA:  Wheezing and cough, evaluate for pneumonia  EXAM: CHEST  2 VIEW  COMPARISON:  DG CHEST 2 VIEW  dated 03/14/2012; DG CHEST 2 VIEW dated 07/15/2011  FINDINGS: Grossly unchanged cardiac silhouette and mediastinal contours with atherosclerotic plaque within the thoracic aorta. The thoracic aorta appears mildly tortuous. The lungs appear hyperexpanded with flattening of bilateral knee diaphragms a mild diffuse slightly nodular thickening of the pulmonary interstitium. There is mild eventration/ elevation and medial aspect of the right hemidiaphragm. No discrete focal airspace opacities. No pleural effusion or pneumothorax. No definite evidence of edema. Progressive DDD within the upper lumbar spine.  IMPRESSION: Hyperexpanded lungs and bronchitic change without acute cardiopulmonary disease. Specifically, no definite evidence of pneumonia.   Electronically Signed   By: Sandi Mariscal M.D.   On: 06/23/2013 19:14    ASSESSMENT / PLAN:  1. Asthmatic Bronchitis -refractory to OP therapy - lots of upper airway wheezing also c/w pseudoasthma component    Rec: Add flutter valve from home and max mucinex dm/ max gerd rx  No pna so change to po zmax 2/14   2. HTN  Cont home meds     Christinia Gully NP-C  Pulmonary and [place] Pager: [phone]  06/24/2013, 9:20 AM

## 2013-06-24 NOTE — Progress Notes (Signed)
Pt refused solu-medrol scheduled for 2200. On call MD, DR Tamala Julian notified. Will continue to monitor.

## 2013-06-24 NOTE — Progress Notes (Signed)
   Name: Sierra [name] MRN: [id number] DOB: [birth year]    ADMISSION DATE:  06/23/13   PRIMARY SERVICE:  Pulmonary   CHIEF COMPLAINT:  Wheezing and Dyspnea  worse.   BRIEF PATIENT DESCRIPTION: 80 yo WF with asthma never smoker w/ refractory AEAB flare in OP setting, admitted from office 06/23/13 .       ANTIBIOTICS: 2/13 Rocephin >  2/13 Azithro >>   SUBJECTIVE/overnight:   Persistent hacking cough and chest tightness, no sob at rest RA  VITAL SIGNS: Temp:  [97.6 F (36.4 C)-98 F (36.7 C)] 97.6 F (36.4 C) (02/14 0600) Pulse Rate:  [82-102] 89 (02/14 0600) Resp:  [18] 18 (02/14 0600) BP: (128-151)/(65-74) 151/73 mmHg (02/14 0600) SpO2:  [93 %-96 %] 96 % (02/14 0747) Weight:  [122 lb (55.339 kg)-127 lb 3.2 oz (57.698 kg)] 123 lb (55.792 kg) (02/14 0600) FIO2  RA  PHYSICAL EXAMINATION:   GEN: A/Ox3; pleasant , NAD, elderly and frail wf with mod pseudowheeze HEENT:  [place]/AT,  EACs-clear, TMs-wnl, NOSE-clear, THROAT-clear, no lesions, no postnasal drip or exudate noted.   NECK:  Supple w/ fair ROM; no JVD; normal carotid impulses w/o bruits; no thyromegaly or nodules palpated; no lymphadenopathy.  RESP  Diffuse bilateral mid  exp wheezing, no stridor  Speaking in full sentences , no accessory muscle use, no dullness to percussion  CARD:  RRR, no m/r/g  , no peripheral edema, pulses intact, no cyanosis or clubbing.  GI:   Soft & nt; nml bowel sounds; no organomegaly or masses detected.  Musco: Warm bil, no deformities or joint swelling noted.        Recent Labs Lab 06/23/13 1840 06/24/13 0548  NA 137 137  K 4.1 4.7  CL 94* 97  CO2 25 25  BUN 24* 26*  CREATININE 0.83 0.73  GLUCOSE 111* 152*    Recent Labs Lab 06/23/13 1840 06/24/13 0548  HGB 12.7 12.7  HCT 38.5 38.4  WBC 17.9* 15.5*  PLT 332 335   X-ray Chest Pa And Lateral   06/23/2013   CLINICAL DATA:  Wheezing and cough, evaluate for pneumonia  EXAM: CHEST  2 VIEW  COMPARISON:  DG CHEST 2 VIEW  dated 03/14/2012; DG CHEST 2 VIEW dated 07/15/2011  FINDINGS: Grossly unchanged cardiac silhouette and mediastinal contours with atherosclerotic plaque within the thoracic aorta. The thoracic aorta appears mildly tortuous. The lungs appear hyperexpanded with flattening of bilateral knee diaphragms a mild diffuse slightly nodular thickening of the pulmonary interstitium. There is mild eventration/ elevation and medial aspect of the right hemidiaphragm. No discrete focal airspace opacities. No pleural effusion or pneumothorax. No definite evidence of edema. Progressive DDD within the upper lumbar spine.  IMPRESSION: Hyperexpanded lungs and bronchitic change without acute cardiopulmonary disease. Specifically, no definite evidence of pneumonia.   Electronically Signed   By: Sandi Mariscal M.D.   On: 06/23/2013 19:14    ASSESSMENT / PLAN:  1. Asthmatic Bronchitis -refractory to OP therapy - lots of upper airway wheezing also c/w pseudoasthma component    Rec: Add flutter valve from home and max mucinex dm/ max gerd rx  No pna so change to po zmax 2/14   2. HTN  Cont home meds     Christinia Gully NP-C  Pulmonary and [place] Pager: [phone]  06/24/2013, 9:28 AM

## 2013-06-25 LAB — GLUCOSE, CAPILLARY: Glucose-Capillary: 259 mg/dL — ABNORMAL HIGH (ref 70–99)

## 2013-06-25 NOTE — Progress Notes (Signed)
   Name: Sierra [name] MRN: [id number] DOB: Oct 20, 1928    ADMISSION DATE:  06/23/13   PRIMARY SERVICE:  Pulmonary   CHIEF COMPLAINT:  Wheezing and Dyspnea  worse.   BRIEF PATIENT DESCRIPTION: 80 yo WF with asthma never smoker w/ refractory AEAB flare in OP setting, admitted from office 06/23/13 .       ANTIBIOTICS: 2/13 Rocephin >  2/13 Azithro >>   SUBJECTIVE/overnight:   Persistent hacking cough and chest tightness, no sob at rest RA Refused pm dose of solumedrol, not clear why    VITAL SIGNS: Temp:  [98.3 F (36.8 C)-98.8 F (37.1 C)] 98.5 F (36.9 C) (02/15 0615) Pulse Rate:  [85-111] 111 (02/15 0615) Resp:  [18] 18 (02/15 0615) BP: (124-158)/(55-77) 158/69 mmHg (02/15 0929) SpO2:  [93 %-97 %] 97 % (02/15 0754) Weight:  [122 lb 5.7 oz (55.5 kg)] 122 lb 5.7 oz (55.5 kg) (02/15 0649) FIO2  RA  PHYSICAL EXAMINATION:   GEN: A/Ox3; pleasant , NAD, elderly and frail wf with mod pseudowheeze HEENT:  [place]/AT,  EACs-clear, TMs-wnl, NOSE-clear, THROAT-clear, no lesions, no postnasal drip or exudate noted.   NECK:  Supple w/ fair ROM; no JVD; normal carotid impulses w/o bruits; no thyromegaly or nodules palpated; no lymphadenopathy.  RESP  Diffuse bilateral mid  exp wheezing, no stridor  Speaking in full sentences , no accessory muscle use, no dullness to percussion  CARD:  RRR, no m/r/g  , no peripheral edema, pulses intact, no cyanosis or clubbing.  GI:   Soft & nt; nml bowel sounds; no organomegaly or masses detected.  Musco: Warm bil, no deformities or joint swelling noted.        Recent Labs Lab 06/23/13 1840 06/24/13 0548  NA 137 137  K 4.1 4.7  CL 94* 97  CO2 25 25  BUN 24* 26*  CREATININE 0.83 0.73  GLUCOSE 111* 152*    Recent Labs Lab 06/23/13 1840 06/24/13 0548  HGB 12.7 12.7  HCT 38.5 38.4  WBC 17.9* 15.5*  PLT 332 335   X-ray Chest Pa And Lateral   06/23/2013   CLINICAL DATA:  Wheezing and cough, evaluate for pneumonia  EXAM: CHEST   2 VIEW  COMPARISON:  DG CHEST 2 VIEW dated 03/14/2012; DG CHEST 2 VIEW dated 07/15/2011  FINDINGS: Grossly unchanged cardiac silhouette and mediastinal contours with atherosclerotic plaque within the thoracic aorta. The thoracic aorta appears mildly tortuous. The lungs appear hyperexpanded with flattening of bilateral knee diaphragms a mild diffuse slightly nodular thickening of the pulmonary interstitium. There is mild eventration/ elevation and medial aspect of the right hemidiaphragm. No discrete focal airspace opacities. No pleural effusion or pneumothorax. No definite evidence of edema. Progressive DDD within the upper lumbar spine.  IMPRESSION: Hyperexpanded lungs and bronchitic change without acute cardiopulmonary disease. Specifically, no definite evidence of pneumonia.   Electronically Signed   By: Sandi Mariscal M.D.   On: 06/23/2013 19:14    ASSESSMENT / PLAN:  1. Asthmatic Bronchitis -refractory to OP therapy - lots of upper airway wheezing also c/w pseudoasthma component  - pt refused solumedrol 2/14   Rec: Added  flutter valve from home and max mucinex dm/ max gerd rx  2/14 No pna so changed to po zmax 2/14   2. HTN  Cont home meds     Christinia Gully NP-C  Pulmonary and [place] Pager: [phone]  06/25/2013, 10:36 AM

## 2013-06-26 LAB — GLUCOSE, CAPILLARY: GLUCOSE-CAPILLARY: 230 mg/dL — AB (ref 70–99)

## 2013-06-26 MED ORDER — PREDNISONE 20 MG PO TABS
40.0000 mg | ORAL_TABLET | Freq: Every day | ORAL | Status: DC
  Administered 2013-06-27: 40 mg via ORAL
  Filled 2013-06-26 (×2): qty 2

## 2013-06-26 NOTE — Progress Notes (Signed)
Name: Sierra [name] MRN: [id number] DOB: August 22, 1928    ADMISSION DATE:  06/23/13  PRIMARY SERVICE:  Pulmonary   CHIEF COMPLAINT:  Wheezing and Dyspnea  worse.   BRIEF PATIENT DESCRIPTION: 80 y/o WF, never smoker, with asthma presented w/ refractory AEAB flare in OP setting, admitted from office 06/23/13 .   ANTIBIOTICS: 2/13 Rocephin>>  2/13 Azithro>>  SUBJECTIVE:  Pt reports feeling better but not back to baseline.  Hopeful for d/c in am 2/17  VITAL SIGNS: Temp:  [98.4 F (36.9 C)-98.9 F (37.2 C)] 98.9 F (37.2 C) (02/16 0606) Pulse Rate:  [100] 100 (02/16 0606) Resp:  [18] 18 (02/16 0606) BP: (136-158)/(64-80) 158/80 mmHg (02/16 0606) SpO2:  [93 %-96 %] 95 % (02/16 0829) Weight:  [124 lb 9 oz (56.5 kg)] 124 lb 9 oz (56.5 kg) (02/16 0606) FIO2  RA  PHYSICAL EXAMINATION:  GEN: A/Ox3; pleasant , NAD, elderly and frail  HEENT:  [place]/AT,  EACs-clear, mm pink/moist NECK:  Supple w/ fair ROM; no JVD;  no lymphadenopathy. RESP:  resp's even/non-labored, lungs bilaterally with few fine scattered wheezes.  No distress, speaks full sentences.  CARD:  RRR, no m/r/g , no peripheral edema, pulses intact, no cyanosis or clubbing. GI:   Soft & nt; nml bowel sounds; no organomegaly or masses detected. MSK: Warm bil, no deformities or joint swelling noted.    Recent Labs Lab 06/23/13 1840 06/24/13 0548  NA 137 137  K 4.1 4.7  CL 94* 97  CO2 25 25  BUN 24* 26*  CREATININE 0.83 0.73  GLUCOSE 111* 152*    Recent Labs Lab 06/23/13 1840 06/24/13 0548  HGB 12.7 12.7  HCT 38.5 38.4  WBC 17.9* 15.5*  PLT 332 335   No results found.  ASSESSMENT / PLAN:  Asthmatic Bronchitis - refractory to OP therapy.  Lots of upper airway wheezing also c/w pseudoasthma component. Pt refused solumedrol 2/14   Plan: -flutter valve  -max mucinex dm/ max gerd rx  2/14 -abx as above > d/c rocephin, zithromax x 4 more days (oral) -PPI -change IV steroids to oral 2/16 (will receive one more IV  dose 2/16) -continue astelin, flonase  HTN   Plan: -Cont norvasc  GLOBAL: -likely will d/c am 2/17 -no oxygen needs -may need new nebulizer machine / have current checked  Noe Gens, NP-C [place] [facility] Pgr: [phone] or [phone]   Independently examined pt, evaluated data & formulated above care plan with NP who scribed this note & edited by me.  ALVA,RAKESH V.  [phone]  06/26/2013, 11:19 AM

## 2013-06-26 NOTE — Patient Instructions (Signed)
Admit  

## 2013-06-26 NOTE — Progress Notes (Signed)
See h/p admit from office

## 2013-06-27 DIAGNOSIS — J209 Acute bronchitis, unspecified: Secondary | ICD-10-CM

## 2013-06-27 LAB — CBC
HCT: 36.8 % (ref 36.0–46.0)
Hemoglobin: 12.4 g/dL (ref 12.0–15.0)
MCH: 27 pg (ref 26.0–34.0)
MCHC: 33.7 g/dL (ref 30.0–36.0)
MCV: 80.2 fL (ref 78.0–100.0)
PLATELETS: 283 10*3/uL (ref 150–400)
RBC: 4.59 MIL/uL (ref 3.87–5.11)
RDW: 18.3 % — ABNORMAL HIGH (ref 11.5–15.5)
WBC: 20.5 10*3/uL — AB (ref 4.0–10.5)

## 2013-06-27 LAB — BASIC METABOLIC PANEL
BUN: 30 mg/dL — ABNORMAL HIGH (ref 6–23)
CO2: 24 meq/L (ref 19–32)
Calcium: 8.7 mg/dL (ref 8.4–10.5)
Chloride: 97 mEq/L (ref 96–112)
Creatinine, Ser: 0.74 mg/dL (ref 0.50–1.10)
GFR calc Af Amer: 89 mL/min — ABNORMAL LOW (ref >90)
GFR calc non Af Amer: 77 mL/min — ABNORMAL LOW (ref >90)
Glucose, Bld: 143 mg/dL — ABNORMAL HIGH (ref 70–99)
Potassium: 4.2 mEq/L (ref 3.7–5.3)
SODIUM: 134 meq/L — AB (ref 137–147)

## 2013-06-27 LAB — GLUCOSE, CAPILLARY: Glucose-Capillary: 127 mg/dL — ABNORMAL HIGH (ref 70–99)

## 2013-06-27 MED ORDER — AZITHROMYCIN 250 MG PO TABS: 250.0000 mg | ORAL_TABLET | Freq: Every day | ORAL | Status: DC

## 2013-06-27 MED ORDER — PREDNISONE 20 MG PO TABS: ORAL_TABLET | ORAL | Status: DC

## 2013-06-27 MED ORDER — DM-GUAIFENESIN ER 30-600 MG PO TB12: 2.0000 | ORAL_TABLET | Freq: Two times a day (BID) | ORAL | Status: DC

## 2013-06-27 MED ORDER — OMEPRAZOLE 20 MG PO CPDR: 20.0000 mg | DELAYED_RELEASE_CAPSULE | Freq: Two times a day (BID) | ORAL | Status: DC

## 2013-06-27 NOTE — Progress Notes (Signed)
Patient discharge home with husband, alert and oriented, discharge instructions given, patient and husband verbalize understanding of discharge instructions given, active member of My Chart, patient in stable condition at this time, will follow up with PCP for reassessment of plan of care at home

## 2013-06-27 NOTE — Care Management Note (Unsigned)
    Page 1 of 1   06/27/2013     12:38:47 PM   CARE MANAGEMENT NOTE 06/27/2013  Patient:  Sierra [name], Sierra [name]   Account Number:  [id number]  Date Initiated:  06/26/2013  Documentation initiated by:  [facility]  Subjective/Objective Assessment:   80 year old female admitted with worsening wheezing and dyspnea.     Action/Plan:   From home.   Anticipated DC Date:  06/29/2013   Anticipated DC Plan:  [place]  CM consult      Choice offered to / List presented to:             Status of service:  In process, will continue to follow Medicare Important Message given?  NA - LOS <3 / Initial given by admissions (If response is "NO", the following Medicare IM given date fields will be blank) Date Medicare IM given:   Date Additional Medicare IM given:    Discharge Disposition:    Per UR Regulation:  Reviewed for med. necessity/level of care/duration of stay  If discussed at [place] of Stay Meetings, dates discussed:    Comments:  06/27/13 [facility] RN BSN I was consulted to speak with pt about her nebulizer machine. I met with her at bedside. She stated the machine does work but is is old and has never been cleaned or had the filter changed. I enquired from her if there was a phone # on it to which she stated yes. I advised her to call that # and voice her concerns to them. She verbalized understanding.

## 2013-06-27 NOTE — Discharge Summary (Signed)
Physician Discharge Summary  [name] MRN: [id number] DOB/AGE: [birth year] 78 y.o.  Admit date: 06/23/2013 Discharge date: 06/27/2013    Discharge Diagnoses:  Acute Asthmatic Bronchitis Steroid Dependent HTN Hypothyroidism                                                                      DISCHARGE PLAN BY DIAGNOSIS    Asthmatic Bronchitis  Steroid Dependent  Discharge Plan: -flutter valve  -mucinex DM -Priolosec BID, consider reduction to QD at follow up visit in office (was on QOD prior to admit) -complete abx (oral azithro) -taper steroids back to baseline of 78m QD -continue astelin, flonase, allegra -continue flovent -HH service to assess home nebulizer machine -pt instructed to call for follow up as office is closed due to inclement weather   HTN   Discharge Plan: -Continue norvasc 2.78mQD  Hypothyroidism  Discharge Plan: -continue synthroid                   DISCHARGE SUMMARY   Sierra [name] a [age].o. y/o female with a PMH of asthma, complicated by allergic rhinitis, bronchitis, GERD, hx of atypical AFB. Hx brain tumor/ adrenal insufficiency on chronic prednisone. Patient was seen in the Pulmonary Office on 2/13 with complaints of a two-week history of persistent cough, congestion, with initial, green mucus, fever, or nasal congestion. Prior to that visit, she was seen in the office on February 3rd & given Biaxin and a prednisone taper. Unfortunately, she was unable to take Biaxin do to generalized intolerance. She finished the prednisone without any improvement in her symptoms. On 2/13 she reported worsening of cough and wheezing. Upon arrival to the office, she was noted to have significant wheezing that did not improve with the Xopenex nebulizer treatment.  [facility]   She was placed on IV steroids, scheduled nebulized bronchodilators, mucinex BID, GERD Rx, IV  antibiotics and aggressive pulmonary hygiene.  CXR was clear for acute infiltrate.  ABX were narrowed and patient made slow improvement.  Discharge plan as above.  No new home health needs identified prior to discharge.     ANTIBIOTICS:  2/13 Rocephin>>2/16 2/13 Azithro>>complete post d/c  Discharge Exam: GEN: A/Ox3; pleasant , NAD, elderly and frail  HEENT: [place]/AT, EACs-clear, mm pink/moist  NECK: Supple w/ fair ROM; no JVD; no lymphadenopathy.  RESP: resp's even/non-labored, lungs bilaterally with few rhonchi. No distress, speaks full sentences.  CARD: RRR, no m/r/g , no peripheral edema, pulses intact, no cyanosis or clubbing.  GI: Soft & nt; nml bowel sounds; no organomegaly or masses detected.  MSK: Warm bil, no deformities or joint swelling noted.    Filed Vitals:   06/26/13 1931 06/26/13 2104 06/27/13 0533 06/27/13 0830  BP:  128/69 147/68   Pulse:  86 90 86  Temp:  97.9 F (36.6 C) 98 F (36.7 C)   TempSrc:  Oral Oral   Resp:  16 18 18   Height:      Weight:   123 lb 10.9 oz (56.1 kg)   SpO2: 94% 94% 97% 98%    Discharge Labs  BMET  Recent Labs Lab 06/23/13 1840 06/24/13 0548  06/27/13 0523  NA 137 137 134*  K 4.1 4.7 4.2  CL 94* 97 97  CO2 25 25 24   GLUCOSE 111* 152* 143*  BUN 24* 26* 30*  CREATININE 0.83 0.73 0.74  CALCIUM 10.5 10.1 8.7   CBC  Recent Labs Lab 06/23/13 1840 06/24/13 0548 06/27/13 0523  HGB 12.7 12.7 12.4  HCT 38.5 38.4 36.8  WBC 17.9* 15.5* 20.5*  PLT 332 335 283   Discharge Orders   Future Appointments Provider Department Dept Phone   10/11/2013 9:15 AM Deneise Lever, MD [facility] [phone]   Future Orders Complete By Expires   Call MD for:  difficulty breathing, headache or visual disturbances  As directed    Call MD for:  temperature >100.4  As directed    Diet general  As directed    Discharge instructions  As directed    Comments:     1. Take medications as prescribed.  Review your med list carefully.   2.  You will need to call and schedule a follow up visit with Dr. Melvyn Novas or Rexene Edison, NP to be seen within one week as the office is closed 2/17.  3. Please return to the ER if you have new or worsening symptoms 4. YOUR NEXT DOSE OF ANTIBIOTICS ARE DUE TOMORROW 2/18   Increase activity slowly  As directed          Follow-up Information   Schedule an appointment as soon as possible for a visit with PARRETT,TAMMY, NP. (Call for appt to be see for hospital follow up within 1 week with Dr. Annamaria Boots or Lynelle Smoke)    Specialty:  Nurse Practitioner   Contact information:   [address] [place] [address] [phone]        Medication List         albuterol (2.5 MG/3ML) 0.083% nebulizer solution  Commonly known as:  PROVENTIL  Take 3 mLs (2.5 mg total) by nebulization every 6 (six) hours as needed for wheezing or shortness of breath. DX 493.90     amLODipine 2.5 MG tablet  Commonly known as:  NORVASC  Take 2.5 mg by mouth daily after breakfast.     azithromycin 250 MG tablet  Commonly known as:  ZITHROMAX  Take 1 tablet (250 mg total) by mouth daily.     B-complex with vitamin C tablet  Take 1 tablet by mouth daily.     BION TEARS 0.1-0.3 % Soln  Place 1 drop into both eyes 2 (two) times daily as needed (dry eyes).     Biotin 5000 MCG Caps  Take 5,000 mcg by mouth daily.     cholecalciferol 1000 UNITS tablet  Commonly known as:  VITAMIN D  Take 1,000 Units by mouth daily.     dextromethorphan-guaiFENesin 30-600 MG per 12 hr tablet  Commonly known as:  MUCINEX DM  Take 2 tablets by mouth 2 (two) times daily.     DYMISTA 137-50 MCG/ACT Susp  Generic drug:  Azelastine-Fluticasone  Place 1 spray into the nose at bedtime.     fexofenadine 180 MG tablet  Commonly known as:  ALLEGRA  Take 60-180 mg by mouth daily as needed. Allergies     fluticasone 110 MCG/ACT inhaler  Commonly known as:  FLOVENT HFA  Inhale 2 puffs into the lungs 2 (two) times daily.      HYDROcodone-homatropine 5-1.5 MG/5ML syrup  Commonly known as:  HYCODAN  Take 5 mLs by mouth every 6 (six)  hours as needed for cough.     ibuprofen 200 MG tablet  Commonly known as:  ADVIL,MOTRIN  Take 400 mg by mouth every 6 (six) hours as needed for moderate pain.     levothyroxine 75 MCG tablet  Commonly known as:  SYNTHROID, LEVOTHROID  Take 75 mcg by mouth daily before breakfast.     LORazepam 0.5 MG tablet  Commonly known as:  ATIVAN  Take 0.5 mg by mouth at bedtime as needed for sleep.     multivitamin tablet  Take 1 tablet by mouth daily.     omeprazole 20 MG capsule  Commonly known as:  PRILOSEC  Take 1 capsule (20 mg total) by mouth 2 (two) times daily before a meal.     predniSONE 20 MG tablet  Commonly known as:  DELTASONE  2 tabs daily for 3 days, then 1 tab daily for 3 days, then 1/2 tab daily for 3 days then return to 43m daily        Disposition:  Home.  Will ask home care agency to check nebulizer machine.   Discharged Condition: Sierra HASSEBROCKhas met maximum benefit of inpatient care and is medically stable and cleared for discharge.  Patient is pending follow up as above.      Time spent on disposition:  Greater than 35 minutes.   Signed: BNoe Gens NP-C [place] [facility] Pgr: [phone] Office: [phone]   Independently examined pt, evaluated data & formulated above discharge care plan with NP who scribed this note & edited by me.   ALVA,RAKESH V.

## 2013-06-30 ENCOUNTER — Encounter: Payer: Self-pay | Admitting: Internal Medicine

## 2013-06-30 ENCOUNTER — Ambulatory Visit (INDEPENDENT_AMBULATORY_CARE_PROVIDER_SITE_OTHER): Payer: Medicare Other | Admitting: Internal Medicine

## 2013-06-30 VITALS — BP 110/60 | HR 89 | Ht 63.5 in | Wt 125.2 lb

## 2013-06-30 DIAGNOSIS — J45909 Unspecified asthma, uncomplicated: Secondary | ICD-10-CM

## 2013-06-30 DIAGNOSIS — A319 Mycobacterial infection, unspecified: Secondary | ICD-10-CM

## 2013-06-30 NOTE — Patient Instructions (Addendum)
I am so glad you feel better  Follow the directions for the prednisone taper  Please call as needed

## 2013-06-30 NOTE — Progress Notes (Signed)
[name], female    DOB: April 26, 1929, 80 y.o.   MRN: [id number]  HPI 10/27/10- 81 yoF followed for asthma complicated by allergic rhinitis, bronchitis, GERD, hx of atypical AFB. Allergy vaccine- Dr Donneta Romberg. Last here -June 27, 2010- PFTs reviewed at that visit. Since then has done very well with no significant flares. She feels getting away from winter viruses was key.  She just had blepharoplasty.   02/18/11-  70 yoF followed for asthma complicated by allergic rhinitis, bronchitis, GERD, hx of atypical AFB. Allergy vaccine- Dr Donneta Romberg. She has gotten to the summer and early fall feeling very well. Really minimal chest tightness or nasal congestion. She has not been needing her nebulizer meds or rescue inhaler. We discussed the change in delivery system for Combivent. She will get one more of the current style to keep available. She has not been needing antihistamines or nasal spray.  04/27/11-  79 yoF followed for asthma complicated by allergic rhinitis, bronchitis, GERD, hx of atypical AFB. Allergy vaccine- Dr Donneta Romberg. Hx brain tumor/ adrenal insufficiency.  Husband here. Has had flu vaccine. Did well until 10 days to 2 weeks ago. She had overnight onset of cough but seemed to improve until last night when she again got acutely ill with cough, clear foamy sputum, decreased appetite, sore throat, muscle aches. Temperature at home today was 100. She continues prednisone 5 mg daily for adrenal insufficiency after treatment for brain tumor. Due for hip replacement in March.  05/15/11-  69 yoF followed for asthma complicated by allergic rhinitis, bronchitis, GERD, hx of atypical AFB. Allergy vaccine- Dr Donneta Romberg. Hx brain tumor/ adrenal insufficiency.  Husband is with her. She continues to wheeze with the exacerbation for which we saw her December 17. Sputum was cultured but is now foamy and white. She denies fever as she finishes amoxicillin. Sleeping on 3 pillows. Her nebulizer machine  made her too nervous. She had originally taken the Zithromax pack before amoxicillin. Doxycycline caused nausea and she had to stop. She has gone to 2 or 3 separate social functions that she felt she had to attend although she remains very tight in the chest with much cough and shortness of breath.  07/10/11-  62 yoF followed for asthma complicated by allergic rhinitis, bronchitis, GERD, hx of atypical AFB. Allergy vaccine- Dr Donneta Romberg. Hx brain tumor/ adrenal insufficiency.  Pending hip replacement March 18. Feeling mild chest and head congestion over the past 2 days with increased cough. Last night was wheezing. Has not needed her nebulizer machine and denies fever or malaise. Husband just had colon cancer resected.   Acute OV 07/21/11 -- 80 yo woman, hx asthma that has been labile and affected by allergies, GERD. 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 Presents today noting . She was just treated with Augmentin starting 3/8, finished a pred taper from 3/1 back down to chronic 5mg  qd. She is on flovent, combivent prn, nebulized albuterol. Also fexofenedine and omeprazole.  She has had bouts of severe cough, UA noise, wheezing. No fevers, chills, sputum, CP. She is having PND. No GERD sx.  PHARYNGITIS, CHRONIC Flaring following apparent URI and resistant to rx with pred taper and also abx. Exacerbating factors are PND and GERD. She is taking allegra prn, omeprazole qd. Will try to ramp up rx of both factors to see if we can calm her throat down. - Start NSW qd - continue allegra - increase omeprazole to bid temporarily - continue pred 5 and finish  the augmentin - not clear that this is her asthma, sounds like all UA symptoms, although she confuses the two and calls it all asthma. It would be tempting to give her a break from Flovent to see if it would calm her throat down, since this is the driving problem at this time. She will continue for now. Asked her to use her nebs prn and not on a schedule.  - she  will call later this week to update on her progress ROV Dr Annamaria Boots in 3 -4 weeks  08/25/11- 81 yoF followed for asthma complicated by allergic rhinitis, bronchitis, GERD, hx of atypical AFB. Allergy vaccine- Dr Donneta Romberg. Hx brain tumor/ adrenal insufficiency. PCP Dr [name]  [name],fatigue deneis any wheezing, cough  .having  dizzy spells  more frequent. Had left knee replacement March 18 under spinal anesthesia. Wheezed immediately before surgery and was given a nebulizer treatment preop. Since then has done very well and says she feels well today. Did have lightheadedness or dizziness which was evaluated with EKG and CT scan. History of meningioma 11 years ago.  10/09/2011 Acute OV  Complains of cough with clear mucus, wheezing, DOE, decreased energy x1week  Barky cough,  Exposed to paint fumes and cough started.  No fever or discolored mucus.  No hemoptysis , no edema.   11/10/11- 22 yoF followed for asthma complicated by allergic rhinitis, bronchitis, GERD, hx of atypical AFB. Hx brain tumor/ adrenal insufficiency. PCP Dr [name] Was seen by a nurse practitioner in May and treated with Augmentin and prednisone increased to 10 mg. She is now back to 5 mg of prednisone daily. Staying indoors to avoid the weather. Short of breath since left hip replacement surgery in March. Occasional sneeze. She dropped off of Dr. Rush Landmark allergy vaccine. Cough produces scant phlegm. Denies chest pain. She says recent echocardiogram and EKG were "fine". Dyspnea on exertion. She just stopped to rest making her bed.  CXR- 07/15/11-  IMPRESSION:  Mild hyperinflation. No active disease.  Original Report Authenticated By: Raelyn Number, M.D.   03/14/12- 81 yoF followed for asthma complicated by allergic rhinitis, bronchitis, GERD, hx of atypical AFB. Hx brain tumor/ adrenal insufficiency. PCP Dr [name]    husband here FOLLOWS FOR: still having alot of cough-thick foamy light green in  color; afraid to take Avelox after reading about side effects because her right shoulder is still healing.  Acute illness started as a cold. Now deep rattle comes and goes, worse over the past 3 weeks but she sleeps well. Coughs out "clear foam". She remains on prednisone 5 mg daily.  04/29/12- 37 yoF followed for asthma complicated by allergic rhinitis, bronchitis, GERD, hx of atypical AFB. Hx brain tumor/ adrenal insufficiency. PCP Dr [name]    husband here ACUTE VISIT: went to Neuro MD yesterday for dizzy spells and unable to walk straight-told  to follow up here for Increased SOB-states she can tell her O2 gets low; feels weak and dizzy; not using Combivent-feels like it burns her throat and needs Flovent refilled She is worried she may have a recurrence of her brain tumor. At her primary physician's office yesterday for prednisone was increased to 15 mg twice daily for 3 days then 10 mg twice daily for 3 days then 5 mg twice daily x3 days, then back to her 5 mg daily maintenance. She says a chest x-ray was done "no pneumonia" CXR 03/14/12 IMPRESSION:  No active disease. Mild hyperinflation again  noted.  Original Report Authenticated By: Lahoma Crocker, M.D.   05/16/12-  FOLLOWS FOR: pt states her breathing has gotten worse since last OV. reports that her breathing is good in the AM but after lunch is worsens. lots of coughing and wheezing.  Husband here "Bad December. " She describes coughing paroxysms and feels well in between. No effect of cough syrup except that it helps her sleep. Husband hears crackling sounds when she is lying down, breathing quietly. She was on a prednisone taper back to her maintenance 5 mg daily. Finished Augmentin last night. Using Combivent 4 times daily. Tussive soreness upper anterior chest wall. Scant productive white foamy sputum. No fever. Watery mucus from her nose triggered by coughing. She had 2 chest x-rays in November and one in December. One of them at her primary  physician. All described as clear CT chest 08/21/2011 from [facility] shows some mild emphysema and arterial calcifications but otherwise negative.  06/13/12- 10 yoF followed for asthma complicated by allergic rhinitis, bronchitis, GERD, hx of atypical AFB. Hx brain tumor/ adrenal insufficiency. PCP Dr [name]   FOLLOWS FOR: still has productive cough at times; wonders in allergies are starting to flare up again-sneezing. Sneezing and watery rhinorrhea which she attributes to "allergy". Dry cough is much improved compared with last visit. She finished her Biaxin 2 weeks ago. Continues prednisone 5 mg daily maintenance, plus her Flovent inhaler and uses rescue inhaler once or twice a day when needed. Sputum culture from 05/16/2012-normal flora. AFB smear negative.  08/09/12- 83 yoF followed for asthma complicated by allergic rhinitis, bronchitis, GERD, hx of atypical AFB. Hx brain tumor/ adrenal insufficiency. PCP Dr [name]  FOLLOWS XFG:HWEXHB any SOB, wheezing, cough, or congestion today. Would like to discuss Dymista(has been using as needed)-noticed it caused dry mouth before. Feels as comfortable with her breathing now if she ever gets. Occasional use of rescue inhaler but no significant cough or wheeze recently.  10/14/12- 3 yoF followed for asthma complicated by allergic rhinitis, bronchitis, GERD, hx of atypical AFB. Hx brain tumor/ adrenal insufficiency. PCP Dr [name]   Husband here ACUTE VISIT: Started feeling bad Wednesday last week; Thursday started wheezing, cough, SOB, fevers of 100's, not wanting to eat-nothing to eat today. She had done too much in yard work for 2 days. For the past 8 or 10 days has felt acutely ill with cough especially bad for the past 2 days. Deep cough, exhausted. Nebulizer does not help. Dr [name] also wanted to see if increasing maintenance prednisone would help her chronic dizziness.  02/09/13- 3 yoF followed for asthma complicated by allergic rhinitis,  bronchitis, GERD, hx of atypical AFB. Hx brain tumor/ adrenal insufficiency. PCP Dr [name]   Husband here FOLLOWS ZJI:RCVELF she has had a few flare ups since last visit(used rescue inhaler maybe 3 times) Had flu vaccine We reviewed her recent hospitalization- salivary stone surgery complicated by bleeding diverticulum. Dymista helps sneeze and rhinorhea.  06/13/13-84 yoF followed for asthma complicated by allergic rhinitis, bronchitis, GERD, hx of atypical AFB. Hx brain tumor/ adrenal insufficiency. PCP Dr [name]   Husband here FOLLOWS FOR: pt is currently having cough-productive-green; no fevers, runny nose and sneezing. Has been going on since Saturday.  06/30/13- 84 yoF followed for asthma complicated by allergic rhinitis, bronchitis, GERD, hx of atypical AFB. Hx brain tumor/ adrenal insufficiency. PCP Dr [name]   Husband here Acute hosp 2/13-2/17/15 for AE asthma/ bronchitis FOLLOWS FOR:  Breathing and cough have improved.  c/o: of stroke  affecting (L) eye and causing dizzy spells Breathing has been much better since hospital, without wheeze now. Using just Flovent without needing her nebulizer. Tapering prednisone towards 5 mg daily maintenance dose. CXR 06/23/13 IMPRESSION:  Hyperexpanded lungs and bronchitic change without acute  cardiopulmonary disease. Specifically, no definite evidence of  pneumonia.  Electronically Signed  By: Sandi Mariscal M.D.  On: 06/23/2013 19:14   ROS-see HPI Constitutional:   No-   weight loss, night sweats, +fevers, chills, fatigue, lassitude. HEENT:   No-  headaches, difficulty swallowing, tooth/dental problems, sore throat,       No-sneezing, itching, ear ache, +nasal congestion, no-post nasal drip,  CV:  No-   chest pain, orthopnea, PND, swelling in lower extremities, anasarca, dizziness, palpitations Resp: + improved shortness of breath with exertion or at rest.              No- productive cough, no- non-productive cough,  No- coughing up of blood.               No-   change in color of mucus.  No- wheezing.   Skin: No-   rash or lesions. GI:  No-   heartburn, indigestion, abdominal pain, nausea, vomiting,  GU:  MS:  No-   joint pain or swelling.  . Neuro-     +dizzy since ophthalmic artery stroke Psych:  No- change in mood or affect. No depression or anxiety.  No memory loss.  OBJ- Physical Exam General- Alert, Oriented, Affect-appropriate, Distress- none acute.  Skin- rash-none, lesions- none, excoriation- none Lymphadenopathy- none Head- atraumatic            Eyes- Gross vision intact, PERRLA, conjunctivae and secretions clear            Ears- Hearing, canals-normal            Nose- Clear, no-Septal dev, mucus, polyps, erosion, perforation             Throat- Mallampati II , mucosa clear , drainage- none, tonsils- atrophic. A little hoarse with throat clearing. Neck- flexible , trachea midline, stridor-none, thyroid nl, carotid no bruit Chest - symmetrical excursion , unlabored           Heart/CV- RRR , no murmur , no gallop  , no rub, nl s1 s2                           - JVD- none , edema- none, stasis changes- none, varices- none           Lung-  Clear, wheeze-none, cough-none , dullness-none, rub- none           Chest wall-  Abd-  Br/ Gen/ Rectal- Not done, not indicated Extrem- cyanosis- none, clubbing, none, atrophy- none, strength- nl Neuro- grossly intact to observation

## 2013-07-07 NOTE — Assessment & Plan Note (Signed)
Acute exacerbation triggered by infection. Plan-nebulizer Xopenex, Depo-Medrol, prednisone taper, Biaxin, cough syrup

## 2013-07-07 NOTE — Assessment & Plan Note (Signed)
Probably viral upper respiratory infection leading to present acute illness

## 2013-07-25 NOTE — Assessment & Plan Note (Signed)
No activity demonstrated

## 2013-07-25 NOTE — Assessment & Plan Note (Signed)
Resolved after a viral-induced wintertime exacerbation. Now near her baseline. Dependent on low-dose maintenance prednisone.

## 2013-09-08 ENCOUNTER — Other Ambulatory Visit: Payer: Self-pay | Admitting: Orthopedic Surgery

## 2013-09-08 DIAGNOSIS — M48061 Spinal stenosis, lumbar region without neurogenic claudication: Secondary | ICD-10-CM

## 2013-09-11 ENCOUNTER — Ambulatory Visit
Admission: RE | Admit: 2013-09-11 | Discharge: 2013-09-11 | Disposition: A | Discharge status: Home / Self Care | Payer: Medicare Other | Source: Ambulatory Visit | Attending: Orthopedic Surgery | Admitting: Orthopedic Surgery

## 2013-09-11 VITALS — BP 140/72 | HR 67

## 2013-09-11 DIAGNOSIS — M48061 Spinal stenosis, lumbar region without neurogenic claudication: Secondary | ICD-10-CM

## 2013-09-11 MED ORDER — IOHEXOL 180 MG/ML  SOLN
15.0000 mL | Freq: Once | INTRAMUSCULAR | Status: AC | PRN
  Administered 2013-09-11: 15 mL via INTRATHECAL

## 2013-09-11 MED ORDER — DIAZEPAM 5 MG PO TABS
5.0000 mg | ORAL_TABLET | Freq: Once | ORAL | Status: AC
  Administered 2013-09-11: 5 mg via ORAL

## 2013-09-11 MED ORDER — ONDANSETRON HCL 4 MG/2ML IJ SOLN
4.0000 mg | Freq: Once | INTRAMUSCULAR | Status: AC
  Administered 2013-09-11: 4 mg via INTRAMUSCULAR

## 2013-09-11 MED ORDER — MEPERIDINE HCL 100 MG/ML IJ SOLN
75.0000 mg | Freq: Once | INTRAMUSCULAR | Status: AC
  Administered 2013-09-11: 50 mg via INTRAMUSCULAR

## 2013-09-11 NOTE — Progress Notes (Signed)
Dr. Jola Baptist in to speak with patient and her husband.  Brita Romp, RN

## 2013-09-11 NOTE — Discharge Instructions (Signed)
Myelogram Discharge Instructions  1. Go home and rest quietly for the next 24 hours.  It is important to lie flat for the next 24 hours.  Get up only to go to the restroom.  You may lie in the bed or on a couch on your back, your stomach, your left side or your right side.  You may have one pillow under your head.  You may have pillows between your knees while you are on your side or under your knees while you are on your back.  2. DO NOT drive today.  Recline the seat as far back as it will go, while still wearing your seat belt, on the way home.  3. You may get up to go to the bathroom as needed.  You may sit up for 10 minutes to eat.  You may resume your normal diet and medications unless otherwise indicated.  Drink lots of extra fluids today and tomorrow.  4. The incidence of headache, nausea, or vomiting is about 5% (one in 20 patients).  If you develop a headache, lie flat and drink plenty of fluids until the headache goes away.  Caffeinated beverages may be helpful.  If you develop severe nausea and vomiting or a headache that does not go away with flat bed rest, call [phone].  5. You may resume normal activities after your 24 hours of bed rest is over; however, do not exert yourself strongly or do any heavy lifting tomorrow. If when you get up you have a headache when standing, go back to bed and force fluids for another 24 hours.  6. Call your physician for a follow-up appointment.  The results of your myelogram will be sent directly to your physician by the following day.  7. If you have any questions or if complications develop after you arrive home, please call [phone].  Discharge instructions have been explained to the patient.  The patient, or the person responsible for the patient, fully understands these instructions.      May resume tramadol on Sep 12, 2013, after 9:30 am.

## 2013-09-11 NOTE — Progress Notes (Signed)
Patient states she has been off Tramadol for at least the past two days.  jkl 

## 2013-10-11 ENCOUNTER — Encounter: Payer: Self-pay | Admitting: Internal Medicine

## 2013-10-11 ENCOUNTER — Ambulatory Visit (INDEPENDENT_AMBULATORY_CARE_PROVIDER_SITE_OTHER): Payer: Medicare Other | Admitting: Internal Medicine

## 2013-10-11 VITALS — BP 120/76 | HR 82 | Ht 63.5 in | Wt 129.4 lb

## 2013-10-11 DIAGNOSIS — J300 Vasomotor rhinitis: Secondary | ICD-10-CM

## 2013-10-11 DIAGNOSIS — K219 Gastro-esophageal reflux disease without esophagitis: Secondary | ICD-10-CM

## 2013-10-11 DIAGNOSIS — J309 Allergic rhinitis, unspecified: Secondary | ICD-10-CM

## 2013-10-11 MED ORDER — ALBUTEROL SULFATE HFA 108 (90 BASE) MCG/ACT IN AERS: 2.0000 | INHALATION_SPRAY | Freq: Four times a day (QID) | RESPIRATORY_TRACT | Status: DC | PRN

## 2013-10-11 NOTE — Patient Instructions (Signed)
Script refill rescue inhaler sent  Please call as needed

## 2013-10-11 NOTE — Progress Notes (Signed)
[name], female    DOB: April 26, 1929, 80 y.o.   MRN: [id number]  HPI 10/27/10- 81 yoF followed for asthma complicated by allergic rhinitis, bronchitis, GERD, hx of atypical AFB. Allergy vaccine- Dr Donneta Romberg. Last here -June 27, 2010- PFTs reviewed at that visit. Since then has done very well with no significant flares. She feels getting away from winter viruses was key.  She just had blepharoplasty.   02/18/11-  70 yoF followed for asthma complicated by allergic rhinitis, bronchitis, GERD, hx of atypical AFB. Allergy vaccine- Dr Donneta Romberg. She has gotten to the summer and early fall feeling very well. Really minimal chest tightness or nasal congestion. She has not been needing her nebulizer meds or rescue inhaler. We discussed the change in delivery system for Combivent. She will get one more of the current style to keep available. She has not been needing antihistamines or nasal spray.  04/27/11-  79 yoF followed for asthma complicated by allergic rhinitis, bronchitis, GERD, hx of atypical AFB. Allergy vaccine- Dr Donneta Romberg. Hx brain tumor/ adrenal insufficiency.  Husband here. Has had flu vaccine. Did well until 10 days to 2 weeks ago. She had overnight onset of cough but seemed to improve until last night when she again got acutely ill with cough, clear foamy sputum, decreased appetite, sore throat, muscle aches. Temperature at home today was 100. She continues prednisone 5 mg daily for adrenal insufficiency after treatment for brain tumor. Due for hip replacement in March.  05/15/11-  69 yoF followed for asthma complicated by allergic rhinitis, bronchitis, GERD, hx of atypical AFB. Allergy vaccine- Dr Donneta Romberg. Hx brain tumor/ adrenal insufficiency.  Husband is with her. She continues to wheeze with the exacerbation for which we saw her December 17. Sputum was cultured but is now foamy and white. She denies fever as she finishes amoxicillin. Sleeping on 3 pillows. Her nebulizer machine  made her too nervous. She had originally taken the Zithromax pack before amoxicillin. Doxycycline caused nausea and she had to stop. She has gone to 2 or 3 separate social functions that she felt she had to attend although she remains very tight in the chest with much cough and shortness of breath.  07/10/11-  62 yoF followed for asthma complicated by allergic rhinitis, bronchitis, GERD, hx of atypical AFB. Allergy vaccine- Dr Donneta Romberg. Hx brain tumor/ adrenal insufficiency.  Pending hip replacement March 18. Feeling mild chest and head congestion over the past 2 days with increased cough. Last night was wheezing. Has not needed her nebulizer machine and denies fever or malaise. Husband just had colon cancer resected.   Acute OV 07/21/11 -- 80 yo woman, hx asthma that has been labile and affected by allergies, GERD. 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 Presents today noting . She was just treated with Augmentin starting 3/8, finished a pred taper from 3/1 back down to chronic 5mg  qd. She is on flovent, combivent prn, nebulized albuterol. Also fexofenedine and omeprazole.  She has had bouts of severe cough, UA noise, wheezing. No fevers, chills, sputum, CP. She is having PND. No GERD sx.  PHARYNGITIS, CHRONIC Flaring following apparent URI and resistant to rx with pred taper and also abx. Exacerbating factors are PND and GERD. She is taking allegra prn, omeprazole qd. Will try to ramp up rx of both factors to see if we can calm her throat down. - Start NSW qd - continue allegra - increase omeprazole to bid temporarily - continue pred 5 and finish  the augmentin - not clear that this is her asthma, sounds like all UA symptoms, although she confuses the two and calls it all asthma. It would be tempting to give her a break from Flovent to see if it would calm her throat down, since this is the driving problem at this time. She will continue for now. Asked her to use her nebs prn and not on a schedule.  - she  will call later this week to update on her progress ROV Dr Annamaria Boots in 3 -4 weeks  08/25/11- 81 yoF followed for asthma complicated by allergic rhinitis, bronchitis, GERD, hx of atypical AFB. Allergy vaccine- Dr Donneta Romberg. Hx brain tumor/ adrenal insufficiency. PCP Dr [name]  [name],fatigue deneis any wheezing, cough  .having  dizzy spells  more frequent. Had left knee replacement March 18 under spinal anesthesia. Wheezed immediately before surgery and was given a nebulizer treatment preop. Since then has done very well and says she feels well today. Did have lightheadedness or dizziness which was evaluated with EKG and CT scan. History of meningioma 11 years ago.  10/09/2011 Acute OV  Complains of cough with clear mucus, wheezing, DOE, decreased energy x1week  Barky cough,  Exposed to paint fumes and cough started.  No fever or discolored mucus.  No hemoptysis , no edema.   11/10/11- 22 yoF followed for asthma complicated by allergic rhinitis, bronchitis, GERD, hx of atypical AFB. Hx brain tumor/ adrenal insufficiency. PCP Dr [name] Was seen by a nurse practitioner in May and treated with Augmentin and prednisone increased to 10 mg. She is now back to 5 mg of prednisone daily. Staying indoors to avoid the weather. Short of breath since left hip replacement surgery in March. Occasional sneeze. She dropped off of Dr. Rush Landmark allergy vaccine. Cough produces scant phlegm. Denies chest pain. She says recent echocardiogram and EKG were "fine". Dyspnea on exertion. She just stopped to rest making her bed.  CXR- 07/15/11-  IMPRESSION:  Mild hyperinflation. No active disease.  Original Report Authenticated By: Raelyn Number, M.D.   03/14/12- 81 yoF followed for asthma complicated by allergic rhinitis, bronchitis, GERD, hx of atypical AFB. Hx brain tumor/ adrenal insufficiency. PCP Dr [name]    husband here FOLLOWS FOR: still having alot of cough-thick foamy light green in  color; afraid to take Avelox after reading about side effects because her right shoulder is still healing.  Acute illness started as a cold. Now deep rattle comes and goes, worse over the past 3 weeks but she sleeps well. Coughs out "clear foam". She remains on prednisone 5 mg daily.  04/29/12- 37 yoF followed for asthma complicated by allergic rhinitis, bronchitis, GERD, hx of atypical AFB. Hx brain tumor/ adrenal insufficiency. PCP Dr [name]    husband here ACUTE VISIT: went to Neuro MD yesterday for dizzy spells and unable to walk straight-told  to follow up here for Increased SOB-states she can tell her O2 gets low; feels weak and dizzy; not using Combivent-feels like it burns her throat and needs Flovent refilled She is worried she may have a recurrence of her brain tumor. At her primary physician's office yesterday for prednisone was increased to 15 mg twice daily for 3 days then 10 mg twice daily for 3 days then 5 mg twice daily x3 days, then back to her 5 mg daily maintenance. She says a chest x-ray was done "no pneumonia" CXR 03/14/12 IMPRESSION:  No active disease. Mild hyperinflation again  noted.  Original Report Authenticated By: Lahoma Crocker, M.D.   05/16/12-  FOLLOWS FOR: pt states her breathing has gotten worse since last OV. reports that her breathing is good in the AM but after lunch is worsens. lots of coughing and wheezing.  Husband here "Bad December. " She describes coughing paroxysms and feels well in between. No effect of cough syrup except that it helps her sleep. Husband hears crackling sounds when she is lying down, breathing quietly. She was on a prednisone taper back to her maintenance 5 mg daily. Finished Augmentin last night. Using Combivent 4 times daily. Tussive soreness upper anterior chest wall. Scant productive white foamy sputum. No fever. Watery mucus from her nose triggered by coughing. She had 2 chest x-rays in November and one in December. One of them at her primary  physician. All described as clear CT chest 08/21/2011 from [facility] shows some mild emphysema and arterial calcifications but otherwise negative.  06/13/12- 10 yoF followed for asthma complicated by allergic rhinitis, bronchitis, GERD, hx of atypical AFB. Hx brain tumor/ adrenal insufficiency. PCP Dr [name]   FOLLOWS FOR: still has productive cough at times; wonders in allergies are starting to flare up again-sneezing. Sneezing and watery rhinorrhea which she attributes to "allergy". Dry cough is much improved compared with last visit. She finished her Biaxin 2 weeks ago. Continues prednisone 5 mg daily maintenance, plus her Flovent inhaler and uses rescue inhaler once or twice a day when needed. Sputum culture from 05/16/2012-normal flora. AFB smear negative.  08/09/12- 83 yoF followed for asthma complicated by allergic rhinitis, bronchitis, GERD, hx of atypical AFB. Hx brain tumor/ adrenal insufficiency. PCP Dr [name]  FOLLOWS [id number] any SOB, wheezing, cough, or congestion today. Would like to discuss Dymista(has been using as needed)-noticed it caused dry mouth before. Feels as comfortable with her breathing now if she ever gets. Occasional use of rescue inhaler but no significant cough or wheeze recently.  10/14/12- 3 yoF followed for asthma complicated by allergic rhinitis, bronchitis, GERD, hx of atypical AFB. Hx brain tumor/ adrenal insufficiency. PCP Dr [name]   Husband here ACUTE VISIT: Started feeling bad Wednesday last week; Thursday started wheezing, cough, SOB, fevers of 100's, not wanting to eat-nothing to eat today. She had done too much in yard work for 2 days. For the past 8 or 10 days has felt acutely ill with cough especially bad for the past 2 days. Deep cough, exhausted. Nebulizer does not help. Dr [name] also wanted to see if increasing maintenance prednisone would help her chronic dizziness.  02/09/13- 3 yoF followed for asthma complicated by allergic rhinitis,  bronchitis, GERD, hx of atypical AFB. Hx brain tumor/ adrenal insufficiency. PCP Dr [name]   Husband here FOLLOWS [id number] she has had a few flare ups since last visit(used rescue inhaler maybe 3 times) Had flu vaccine We reviewed her recent hospitalization- salivary stone surgery complicated by bleeding diverticulum. Dymista helps sneeze and rhinorhea.  06/13/13-84 yoF followed for asthma complicated by allergic rhinitis, bronchitis, GERD, hx of atypical AFB. Hx brain tumor/ adrenal insufficiency. PCP Dr [name]   Husband here FOLLOWS FOR: pt is currently having cough-productive-green; no fevers, runny nose and sneezing. Has been going on since Saturday.  06/30/13- 84 yoF followed for asthma complicated by allergic rhinitis, bronchitis, GERD, hx of atypical AFB. Hx brain tumor/ adrenal insufficiency. PCP Dr [name]   Husband here Acute hosp 2/13-2/17/15 for AE asthma/ bronchitis FOLLOWS FOR:  Breathing and cough have improved.  c/o: of stroke  affecting (L) eye and causing dizzy spells Breathing has been much better since hospital, without wheeze now. Using just Flovent without needing her nebulizer. Tapering prednisone towards 5 mg daily maintenance dose. CXR 06/23/13 IMPRESSION:  Hyperexpanded lungs and bronchitic change without acute  cardiopulmonary disease. Specifically, no definite evidence of  pneumonia.  Electronically Signed  By: Sandi Mariscal M.D.  On: 06/23/2013 19:14   ROS-see HPI Constitutional:   No-   weight loss, night sweats, +fevers, chills, fatigue, lassitude. HEENT:   No-  headaches, difficulty swallowing, tooth/dental problems, sore throat,       No-sneezing, itching, ear ache, +nasal congestion, no-post nasal drip,  CV:  No-   chest pain, orthopnea, PND, swelling in lower extremities, anasarca, dizziness, palpitations Resp: + improved shortness of breath with exertion or at rest.              No- productive cough, no- non-productive cough,  No- coughing up of blood.               No-   change in color of mucus.  No- wheezing.   Skin: No-   rash or lesions. GI:  No-   heartburn, indigestion, abdominal pain, nausea, vomiting,  GU:  MS:  No-   joint pain or swelling.  . Neuro-     +dizzy since ophthalmic artery stroke Psych:  No- change in mood or affect. No depression or anxiety.  No memory loss.  OBJ- Physical Exam General- Alert, Oriented, Affect-appropriate, Distress- none acute.  Skin- rash-none, lesions- none, excoriation- none Lymphadenopathy- none Head- atraumatic            Eyes- Gross vision intact, PERRLA, conjunctivae and secretions clear            Ears- Hearing, canals-normal            Nose- Clear, no-Septal dev, mucus, polyps, erosion, perforation             Throat- Mallampati II , mucosa clear , drainage- none, tonsils- atrophic. A little hoarse with throat clearing. Neck- flexible , trachea midline, stridor-none, thyroid nl, carotid no bruit Chest - symmetrical excursion , unlabored           Heart/CV- RRR , no murmur , no gallop  , no rub, nl s1 s2                           - JVD- none , edema- none, stasis changes- none, varices- none           Lung-  Clear, wheeze-none, cough-none , dullness-none, rub- none           Chest wall-  Abd-  Br/ Gen/ Rectal- Not done, not indicated Extrem- cyanosis- none, clubbing, none, atrophy- none, strength- nl Neuro- grossly intact to observation       [name], female    DOB: [birth year], 80 y.o.   MRN: [id number]  HPI 10/27/10- 81 yoF followed for asthma complicated by allergic rhinitis, bronchitis, GERD, hx of atypical AFB. Allergy vaccine- Dr Donneta Romberg. Last here -June 27, 2010- PFTs reviewed at that visit. Since then has done very well with no significant flares. She feels getting away from winter viruses was key.  She just had blepharoplasty.   02/18/11-  69 yoF followed for asthma complicated by allergic rhinitis, bronchitis, GERD, hx of atypical AFB. Allergy vaccine-  Dr Donneta Romberg. She has gotten to the summer and early fall feeling very  well. Really minimal chest tightness or nasal congestion. She has not been needing her nebulizer meds or rescue inhaler. We discussed the change in delivery system for Combivent. She will get one more of the current style to keep available. She has not been needing antihistamines or nasal spray.  04/27/11-  32 yoF followed for asthma complicated by allergic rhinitis, bronchitis, GERD, hx of atypical AFB. Allergy vaccine- Dr Donneta Romberg. Hx brain tumor/ adrenal insufficiency.  Husband here. Has had flu vaccine. Did well until 10 days to 2 weeks ago. She had overnight onset of cough but seemed to improve until last night when she again got acutely ill with cough, clear foamy sputum, decreased appetite, sore throat, muscle aches. Temperature at home today was 100. She continues prednisone 5 mg daily for adrenal insufficiency after treatment for brain tumor. Due for hip replacement in March.  05/15/11-  62 yoF followed for asthma complicated by allergic rhinitis, bronchitis, GERD, hx of atypical AFB. Allergy vaccine- Dr Donneta Romberg. Hx brain tumor/ adrenal insufficiency.  Husband is with her. She continues to wheeze with the exacerbation for which we saw her December 17. Sputum was cultured but is now foamy and white. She denies fever as she finishes amoxicillin. Sleeping on 3 pillows. Her nebulizer machine made her too nervous. She had originally taken the Zithromax pack before amoxicillin. Doxycycline caused nausea and she had to stop. She has gone to 2 or 3 separate social functions that she felt she had to attend although she remains very tight in the chest with much cough and shortness of breath.  07/10/11-  59 yoF followed for asthma complicated by allergic rhinitis, bronchitis, GERD, hx of atypical AFB. Allergy vaccine- Dr Donneta Romberg. Hx brain tumor/ adrenal insufficiency.  Pending hip replacement March 18. Feeling mild chest and head congestion over  the past 2 days with increased cough. Last night was wheezing. Has not needed her nebulizer machine and denies fever or malaise. Husband just had colon cancer resected.   Acute OV 07/21/11 -- 80 yo woman, hx asthma that has been labile and affected by allergies, GERD. 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 Presents today noting . She was just treated with Augmentin starting 3/8, finished a pred taper from 3/1 back down to chronic 5mg  qd. She is on flovent, combivent prn, nebulized albuterol. Also fexofenedine and omeprazole.  She has had bouts of severe cough, UA noise, wheezing. No fevers, chills, sputum, CP. She is having PND. No GERD sx.  PHARYNGITIS, CHRONIC Flaring following apparent URI and resistant to rx with pred taper and also abx. Exacerbating factors are PND and GERD. She is taking allegra prn, omeprazole qd. Will try to ramp up rx of both factors to see if we can calm her throat down. - Start NSW qd - continue allegra - increase omeprazole to bid temporarily - continue pred 5 and finish the augmentin - not clear that this is her asthma, sounds like all UA symptoms, although she confuses the two and calls it all asthma. It would be tempting to give her a break from Flovent to see if it would calm her throat down, since this is the driving problem at this time. She will continue for now. Asked her to use her nebs prn and not on a schedule.  - she will call later this week to update on her progress ROV Dr Annamaria Boots in 3 -4 weeks  08/25/11- 81 yoF followed for asthma complicated by allergic rhinitis, bronchitis, GERD, hx of atypical  AFB. Allergy vaccine- Dr Donneta Romberg. Hx brain tumor/ adrenal insufficiency. PCP Dr [name]  [name],fatigue deneis any wheezing, cough  .having  dizzy spells  more frequent. Had left knee replacement March 18 under spinal anesthesia. Wheezed immediately before surgery and was given a nebulizer treatment preop. Since then has done very  well and says she feels well today. Did have lightheadedness or dizziness which was evaluated with EKG and CT scan. History of meningioma 11 years ago.  10/09/2011 Acute OV  Complains of cough with clear mucus, wheezing, DOE, decreased energy x1week  Barky cough,  Exposed to paint fumes and cough started.  No fever or discolored mucus.  No hemoptysis , no edema.   11/10/11- 28 yoF followed for asthma complicated by allergic rhinitis, bronchitis, GERD, hx of atypical AFB. Hx brain tumor/ adrenal insufficiency. PCP Dr [name] Was seen by a nurse practitioner in May and treated with Augmentin and prednisone increased to 10 mg. She is now back to 5 mg of prednisone daily. Staying indoors to avoid the weather. Short of breath since left hip replacement surgery in March. Occasional sneeze. She dropped off of Dr. Rush Landmark allergy vaccine. Cough produces scant phlegm. Denies chest pain. She says recent echocardiogram and EKG were "fine". Dyspnea on exertion. She just stopped to rest making her bed.  CXR- 07/15/11-  IMPRESSION:  Mild hyperinflation. No active disease.  Original Report Authenticated By: Raelyn Number, M.D.   03/14/12- 81 yoF followed for asthma complicated by allergic rhinitis, bronchitis, GERD, hx of atypical AFB. Hx brain tumor/ adrenal insufficiency. PCP Dr [name]    husband here FOLLOWS FOR: still having alot of cough-thick foamy light green in color; afraid to take Avelox after reading about side effects because her right shoulder is still healing.  Acute illness started as a cold. Now deep rattle comes and goes, worse over the past 3 weeks but she sleeps well. Coughs out "clear foam". She remains on prednisone 5 mg daily.  04/29/12- 38 yoF followed for asthma complicated by allergic rhinitis, bronchitis, GERD, hx of atypical AFB. Hx brain tumor/ adrenal insufficiency. PCP Dr [name]    husband here ACUTE VISIT: went to Neuro MD yesterday for dizzy spells and unable to walk  straight-told  to follow up here for Increased SOB-states she can tell her O2 gets low; feels weak and dizzy; not using Combivent-feels like it burns her throat and needs Flovent refilled She is worried she may have a recurrence of her brain tumor. At her primary physician's office yesterday for prednisone was increased to 15 mg twice daily for 3 days then 10 mg twice daily for 3 days then 5 mg twice daily x3 days, then back to her 5 mg daily maintenance. She says a chest x-ray was done "no pneumonia" CXR 03/14/12 IMPRESSION:  No active disease. Mild hyperinflation again noted.  Original Report Authenticated By: Lahoma Crocker, M.D.   05/16/12-  FOLLOWS FOR: pt states her breathing has gotten worse since last OV. reports that her breathing is good in the AM but after lunch is worsens. lots of coughing and wheezing.  Husband here "Bad December. " She describes coughing paroxysms and feels well in between. No effect of cough syrup except that it helps her sleep. Husband hears crackling sounds when she is lying down, breathing quietly. She was on a prednisone taper back to her maintenance 5 mg daily. Finished Augmentin last night. Using Combivent 4 times daily. Tussive soreness upper anterior  chest wall. Scant productive white foamy sputum. No fever. Watery mucus from her nose triggered by coughing. She had 2 chest x-rays in November and one in December. One of them at her primary physician. All described as clear CT chest 08/21/2011 from [facility] shows some mild emphysema and arterial calcifications but otherwise negative.  06/13/12- 28 yoF followed for asthma complicated by allergic rhinitis, bronchitis, GERD, hx of atypical AFB. Hx brain tumor/ adrenal insufficiency. PCP Dr [name]   FOLLOWS FOR: still has productive cough at times; wonders in allergies are starting to flare up again-sneezing. Sneezing and watery rhinorrhea which she attributes to "allergy". Dry cough is much improved compared with last  visit. She finished her Biaxin 2 weeks ago. Continues prednisone 5 mg daily maintenance, plus her Flovent inhaler and uses rescue inhaler once or twice a day when needed. Sputum culture from 05/16/2012-normal flora. AFB smear negative.  08/09/12- 31 yoF followed for asthma complicated by allergic rhinitis, bronchitis, GERD, hx of atypical AFB. Hx brain tumor/ adrenal insufficiency. PCP Dr [name]  FOLLOWS [id number] any SOB, wheezing, cough, or congestion today. Would like to discuss Dymista(has been using as needed)-noticed it caused dry mouth before. Feels as comfortable with her breathing now if she ever gets. Occasional use of rescue inhaler but no significant cough or wheeze recently.  10/14/12- 3 yoF followed for asthma complicated by allergic rhinitis, bronchitis, GERD, hx of atypical AFB. Hx brain tumor/ adrenal insufficiency. PCP Dr [name]   Husband here ACUTE VISIT: Started feeling bad Wednesday last week; Thursday started wheezing, cough, SOB, fevers of 100's, not wanting to eat-nothing to eat today. She had done too much in yard work for 2 days. For the past 8 or 10 days has felt acutely ill with cough especially bad for the past 2 days. Deep cough, exhausted. Nebulizer does not help. Dr [name] also wanted to see if increasing maintenance prednisone would help her chronic dizziness.  02/09/13- 3 yoF followed for asthma complicated by allergic rhinitis, bronchitis, GERD, hx of atypical AFB. Hx brain tumor/ adrenal insufficiency. PCP Dr [name]   Husband here FOLLOWS JJH:ERDEYC she has had a few flare ups since last visit(used rescue inhaler maybe 3 times) Had flu vaccine We reviewed her recent hospitalization- salivary stone surgery complicated by bleeding diverticulum. Dymista helps sneeze and rhinorhea.  06/13/13-84 yoF followed for asthma complicated by allergic rhinitis, bronchitis, GERD, hx of atypical AFB. Hx brain tumor/ adrenal insufficiency. PCP Dr [name]   Husband here FOLLOWS  FOR: pt is currently having cough-productive-green; no fevers, runny nose and sneezing. Has been going on since Saturday.  06/30/13- 84 yoF followed for asthma complicated by allergic rhinitis, bronchitis, GERD, hx of atypical AFB. Hx brain tumor/ adrenal insufficiency. PCP Dr [name]   Husband here Acute hosp 2/13-2/17/15 for AE asthma/ bronchitis FOLLOWS FOR:  Breathing and cough have improved.  c/o: of stroke affecting (L) eye and causing dizzy spells Breathing has been much better since hospital, without wheeze now. Using just Flovent without needing her nebulizer. Tapering prednisone towards 5 mg daily maintenance dose. CXR 06/23/13 IMPRESSION:  Hyperexpanded lungs and bronchitic change without acute  cardiopulmonary disease. Specifically, no definite evidence of  pneumonia.  Electronically Signed  By: Sandi Mariscal M.D.  On: 06/23/2013 19:14  10/11/13- 63 yoF followed for asthma complicated by allergic rhinitis, bronchitis, GERD, hx of atypical AFB. Complicated by Hx brain tumor/ adrenal insufficiency, CVA. PCP Dr [name]   Husband here FOLLOWS FOR:Pt states she has been doing well since seen  last; had to use her rescue inhaler a few times since 06-2013 visit. Rare need for rescue inhaler but needs refill. Doing a lot of yard work. Continues prednisone maintenance 5 mg daily. Had CVA which left her dizzy and with decreased eyesight.   ROS-see HPI Constitutional:   No-   weight loss, night sweats, +fevers, chills, fatigue, lassitude. HEENT:   No-  headaches, difficulty swallowing, tooth/dental problems, sore throat,       No-sneezing, itching, ear ache, +nasal congestion, no-post nasal drip,  CV:  No-   chest pain, orthopnea, PND, swelling in lower extremities, anasarca, dizziness, palpitations Resp: +shortness of breath with exertion or at rest.              No- productive cough, no- non-productive cough,  No- coughing up of blood.              No-   change in color of mucus.  No- wheezing.    Skin: No-   rash or lesions. GI:  No-   heartburn, indigestion, abdominal pain, nausea, vomiting,  GU:  MS:  No-   joint pain or swelling.  . Neuro-     +dizzy since ophthalmic artery stroke Psych:  No- change in mood or affect. No depression or anxiety.  No memory loss.  OBJ- Physical Exam General- Alert, Oriented, Affect-appropriate, Distress- none acute.  Skin- rash-none, lesions- none, excoriation- none Lymphadenopathy- none Head- atraumatic            Eyes- Gross vision intact, PERRLA, conjunctivae and secretions clear            Ears- Hearing, canals-normal            Nose- Clear, no-Septal dev, mucus, polyps, erosion, perforation             Throat- Mallampati II , mucosa clear , drainage- none, tonsils- atrophic. Neck- flexible , trachea midline, stridor-none, thyroid nl, carotid no bruit Chest - symmetrical excursion , unlabored           Heart/CV- RRR , no murmur , no gallop  , no rub, nl s1 s2                           - JVD- none , edema- none, stasis changes- none, varices- none           Lung-  Clear, wheeze-none, cough-none , dullness-none, rub- none           Chest wall-  Abd-  Br/ Gen/ Rectal- Not done, not indicated Extrem- cyanosis- none, clubbing, none, atrophy- none, strength- nl Neuro- grossly intact to observation

## 2013-10-22 ENCOUNTER — Emergency Department (HOSPITAL_COMMUNITY)
Admission: EM | Admit: 2013-10-22 | Discharge: 2013-10-22 | Disposition: A | Discharge status: Home / Self Care | Payer: Medicare Other | Attending: Emergency Medicine | Admitting: Emergency Medicine

## 2013-10-22 ENCOUNTER — Encounter (HOSPITAL_COMMUNITY): Payer: Self-pay | Admitting: Emergency Medicine

## 2013-10-22 DIAGNOSIS — IMO0002 Reserved for concepts with insufficient information to code with codable children: Secondary | ICD-10-CM | POA: Insufficient documentation

## 2013-10-22 DIAGNOSIS — Z8639 Personal history of other endocrine, nutritional and metabolic disease: Secondary | ICD-10-CM | POA: Insufficient documentation

## 2013-10-22 DIAGNOSIS — Z862 Personal history of diseases of the blood and blood-forming organs and certain disorders involving the immune mechanism: Secondary | ICD-10-CM | POA: Insufficient documentation

## 2013-10-22 DIAGNOSIS — E039 Hypothyroidism, unspecified: Secondary | ICD-10-CM | POA: Insufficient documentation

## 2013-10-22 DIAGNOSIS — I10 Essential (primary) hypertension: Secondary | ICD-10-CM | POA: Insufficient documentation

## 2013-10-22 DIAGNOSIS — R7309 Other abnormal glucose: Secondary | ICD-10-CM | POA: Insufficient documentation

## 2013-10-22 DIAGNOSIS — R739 Hyperglycemia, unspecified: Secondary | ICD-10-CM

## 2013-10-22 DIAGNOSIS — Z79899 Other long term (current) drug therapy: Secondary | ICD-10-CM | POA: Insufficient documentation

## 2013-10-22 DIAGNOSIS — R112 Nausea with vomiting, unspecified: Secondary | ICD-10-CM | POA: Insufficient documentation

## 2013-10-22 DIAGNOSIS — J45909 Unspecified asthma, uncomplicated: Secondary | ICD-10-CM | POA: Insufficient documentation

## 2013-10-22 DIAGNOSIS — K219 Gastro-esophageal reflux disease without esophagitis: Secondary | ICD-10-CM | POA: Insufficient documentation

## 2013-10-22 DIAGNOSIS — M129 Arthropathy, unspecified: Secondary | ICD-10-CM | POA: Insufficient documentation

## 2013-10-22 DIAGNOSIS — R197 Diarrhea, unspecified: Secondary | ICD-10-CM | POA: Insufficient documentation

## 2013-10-22 DIAGNOSIS — R011 Cardiac murmur, unspecified: Secondary | ICD-10-CM | POA: Insufficient documentation

## 2013-10-22 LAB — COMPREHENSIVE METABOLIC PANEL
ALT: 15 U/L (ref 0–35)
AST: 18 U/L (ref 0–37)
Albumin: 3.7 g/dL (ref 3.5–5.2)
Alkaline Phosphatase: 52 U/L (ref 39–117)
BUN: 23 mg/dL (ref 6–23)
CALCIUM: 9.7 mg/dL (ref 8.4–10.5)
CO2: 23 mEq/L (ref 19–32)
Chloride: 102 mEq/L (ref 96–112)
Creatinine, Ser: 0.74 mg/dL (ref 0.50–1.10)
GFR calc non Af Amer: 77 mL/min — ABNORMAL LOW (ref >90)
GFR, EST AFRICAN AMERICAN: 89 mL/min — AB (ref >90)
Glucose, Bld: 285 mg/dL — ABNORMAL HIGH (ref 70–99)
Potassium: 3.5 mEq/L — ABNORMAL LOW (ref 3.7–5.3)
SODIUM: 140 meq/L (ref 137–147)
TOTAL PROTEIN: 7.3 g/dL (ref 6.0–8.3)
Total Bilirubin: 0.5 mg/dL (ref 0.3–1.2)

## 2013-10-22 LAB — CBC
HCT: 45.9 % (ref 36.0–46.0)
HEMOGLOBIN: 15 g/dL (ref 12.0–15.0)
MCH: 28.1 pg (ref 26.0–34.0)
MCHC: 32.7 g/dL (ref 30.0–36.0)
MCV: 86.1 fL (ref 78.0–100.0)
Platelets: 300 10*3/uL (ref 150–400)
RBC: 5.33 MIL/uL — ABNORMAL HIGH (ref 3.87–5.11)
RDW: 14 % (ref 11.5–15.5)
WBC: 19 10*3/uL — ABNORMAL HIGH (ref 4.0–10.5)

## 2013-10-22 MED ORDER — ONDANSETRON 8 MG PO TBDP: 8.0000 mg | ORAL_TABLET | Freq: Three times a day (TID) | ORAL | Status: DC | PRN

## 2013-10-22 MED ORDER — ONDANSETRON HCL 4 MG/2ML IJ SOLN
4.0000 mg | Freq: Once | INTRAMUSCULAR | Status: AC
  Administered 2013-10-22: 4 mg via INTRAVENOUS
  Filled 2013-10-22: qty 2

## 2013-10-22 MED ORDER — SODIUM CHLORIDE 0.9 % IV SOLN
1000.0000 mL | INTRAVENOUS | Status: DC
  Administered 2013-10-22: 1000 mL via INTRAVENOUS

## 2013-10-22 MED ORDER — SODIUM CHLORIDE 0.9 % IV SOLN
1000.0000 mL | Freq: Once | INTRAVENOUS | Status: AC
  Administered 2013-10-22: 1000 mL via INTRAVENOUS

## 2013-10-22 NOTE — ED Notes (Signed)
Pt attempted to esign, pen pad no working

## 2013-10-22 NOTE — ED Notes (Signed)
She states she is feeling better.  She has not vomited this E.D. Visit, and has had one sm. Liquid diarrhea stool.  She remains in no distress.

## 2013-10-22 NOTE — ED Provider Notes (Signed)
CSN: [id number]     Arrival date & time 10/22/13  1001 History   First MD Initiated Contact with Patient 10/22/13 1009     Chief Complaint  Patient presents with  . Emesis  . Diarrhea      HPI Patient was nausea vomiting diarrhea since last evening.  No recent sick contacts.  No fevers or chills.  Denies abdominal pain.  Denies hematemesis.  No melena or hematochezia.  Symptoms are mild to moderate in severity.  She does have a history of ulcerative colitis but reports this does not feel similar to flares of her ulcerative colitis.  Symptoms are mild to moderate in severity.  Nothing worsens or improves her symptoms.   Past Medical History  Diagnosis Date  . Ulcerative colitis   . Hypertension   . Hyperlipemia   . Diverticulitis, colon   . Other specified iron deficiency anemias   . Thrombocytosis   . Leukocytosis   . Esophageal reflux   . Chronic pharyngitis   . Allergic rhinitis   . Acute asthmatic bronchitis   . Hypothyroidism   . Cardiac murmur     DOES NOT CAUSE ANY SYMPTOMS  . Recurrent upper respiratory infection (URI)     ON 07/10/11 PT SAW DR. Tarri Fuller YOUNG FOR TX OF ACUTE BRONCHITIS --GIVEN  EXTRA PREDNISONE ( IN ADDITION TO THE DAILY PREDNISONE PT TAKES)  AND PT FINISHED A Z-PAK--STILL HAS A COUGH TODAY 07/15/11-NO FEVER.  Marland Kitchen Blood transfusion   . Arthritis     HX OF RT SHOULDER BURSITIS-AND THE SHOULDER IS HURTING AT PRESENT, PAIN IN LEFT KNEE AT PRESENT  AND PAIN IN LEFT HIP  . IBS (irritable bowel syndrome)   . Balance problem     SINCE BRAIN TUMOR REMOVED IN 2002-BENIGN TUMOR-PT HAS ADRENAL INSUFFICIENCY AND TAKES DAILY PREDNISONE  . GI bleed 01/06/2013   Past Surgical History  Procedure Laterality Date  . Partial colectomy  2008  . Total hip arthroplasty  OCT 2006    right  . Vesicovaginal fistula closure w/ tah    . Thyroidectomy  1986  . Appendectomy    . Craniotomy    . Subglottal mucocoel  2000  . Meningioma resected  20O2  . Frontalis suspension   10-09-2010    lifting eyelids AND SECOND EYE SURGERY November 19, 2010  . Tonsillectomy  1938  . Abdominal hysterectomy  1985  . Dilation and curettage of uterus  1967  . Eye surgery      2003 -RIGHT CATARACT EXTRACTED AND LEFT WAS DONE IN 2004  . Wrist tendon lesion removed 2007    . Total hip arthroplasty  07/27/2011    Procedure: TOTAL HIP ARTHROPLASTY;  Surgeon: Gearlean Alf, MD;  Location: [facility] ORS;  Service: Orthopedics;  Laterality: Left;  . Colonoscopy N/A 01/05/2013    Procedure: COLONOSCOPY;  Surgeon: Cleotis Nipper, MD;  Location: [facility] ENDOSCOPY;  Service: Endoscopy;  Laterality: N/A;  . Esophagogastroduodenoscopy N/A 01/05/2013    Procedure: ESOPHAGOGASTRODUODENOSCOPY (EGD);  Surgeon: Cleotis Nipper, MD;  Location: [facility] ENDOSCOPY;  Service: Endoscopy;  Laterality: N/A;   Family History  Problem Relation Age of Onset  . Heart attack Father     deceased   History  Substance Use Topics  . Smoking status: Never Smoker   . Smokeless tobacco: Never Used  . Alcohol Use: Yes     Comment: occ glass of wine   OB History   Grav Para Term Preterm Abortions TAB SAB Ect Mult  Living                 Review of Systems  All other systems reviewed and are negative.     Allergies  Biaxin; Ciprofloxacin; Codeine; Doxycycline; Phenytoin; Restasis; and Sulfonamide derivatives  Home Medications   Prior to Admission medications   Medication Sig Start Date End Date Taking? Authorizing Provider  albuterol (PROVENTIL HFA;VENTOLIN HFA) 108 (90 BASE) MCG/ACT inhaler Inhale 2 puffs into the lungs every 6 (six) hours as needed for wheezing or shortness of breath. 10/11/13  Yes Deneise Lever, MD  albuterol (PROVENTIL) (2.5 MG/3ML) 0.083% nebulizer solution Take 3 mLs (2.5 mg total) by nebulization every 6 (six) hours as needed for wheezing or shortness of breath. DX 493.90 06/14/13  Yes Deneise Lever, MD  amLODipine (NORVASC) 2.5 MG tablet Take 2.5 mg by mouth daily after breakfast.   Yes  Historical Provider, MD  Artificial Tear Solution (BION TEARS) 0.1-0.3 % SOLN Place 1 drop into both eyes 2 (two) times daily as needed (dry eyes).    Yes Historical Provider, MD  B Complex-C (B-COMPLEX WITH VITAMIN C) tablet Take 1 tablet by mouth daily.   Yes Historical Provider, MD  Biotin 5000 MCG CAPS Take 5,000 mcg by mouth daily.   Yes Historical Provider, MD  cholecalciferol (VITAMIN D) 1000 UNITS tablet Take 1,000 Units by mouth daily.   Yes Historical Provider, MD  fexofenadine (ALLEGRA) 180 MG tablet Take 180 mg by mouth daily. Allergies 07/09/11  Yes Deneise Lever, MD  fluticasone (FLOVENT HFA) 110 MCG/ACT inhaler Inhale 2 puffs into the lungs 2 (two) times daily.   Yes Historical Provider, MD  ibuprofen (ADVIL,MOTRIN) 200 MG tablet Take 400 mg by mouth every 6 (six) hours as needed for moderate pain.   Yes Historical Provider, MD  levothyroxine (SYNTHROID, LEVOTHROID) 75 MCG tablet Take 75 mcg by mouth daily before breakfast.    Yes Historical Provider, MD  LORazepam (ATIVAN) 0.5 MG tablet Take 0.5 mg by mouth at bedtime as needed for sleep.    Yes Historical Provider, MD  Multiple Vitamin (MULTIVITAMIN) tablet Take 1 tablet by mouth daily.   Yes Historical Provider, MD  omeprazole (PRILOSEC) 20 MG capsule Take 1 capsule (20 mg total) by mouth 2 (two) times daily before a meal. 06/27/13  Yes Brandi L Ollis, NP   BP 119/58  Pulse 85  Temp(Src) 97.6 F (36.4 C) (Oral)  Resp 18  SpO2 94% Physical Exam  Nursing note and vitals reviewed. Constitutional: She is oriented to person, place, and time. She appears well-developed and well-nourished. No distress.  HENT:  Head: Normocephalic and atraumatic.  Eyes: EOM are normal.  Neck: Normal range of motion.  Cardiovascular: Normal rate, regular rhythm and normal heart sounds.   Pulmonary/Chest: Effort normal and breath sounds normal.  Abdominal: Soft. She exhibits no distension. There is no tenderness.  Musculoskeletal: Normal range of  motion.  Neurological: She is alert and oriented to person, place, and time.  Skin: Skin is warm and dry.  Psychiatric: She has a normal mood and affect. Judgment normal.    ED Course  Procedures (including critical care time) Labs Review Labs Reviewed  CBC - Abnormal; Notable for the following:    WBC 19.0 (*)    RBC 5.33 (*)    All other components within normal limits  COMPREHENSIVE METABOLIC PANEL - Abnormal; Notable for the following:    Potassium 3.5 (*)    Glucose, Bld 285 (*)  GFR calc non Af Amer 77 (*)    GFR calc Af Amer 89 (*)    All other components within normal limits    Imaging Review No results found.   EKG Interpretation None      MDM   Final diagnoses:  None    11:51 AM Patient feels much better at this time.  Abdominal exam is benign.  Patient is keeping ginger ale down the emergency departmen.t discharged home with nausea medicine.  Primary care followup.  She understands to return to the ER for new or worsening symptoms    Hoy Morn, MD 10/22/13 1151

## 2013-10-22 NOTE — ED Notes (Addendum)
Hx of ulcerative colitis. Pt reports she and her husband ate chicken last night, husband is not sick. Pt started having vomiting and diarrhea this morning around 0700. Denies pain.

## 2013-10-22 NOTE — Discharge Instructions (Signed)

## 2013-10-22 NOTE — ED Notes (Signed)
There was a delay awaiting the re-arrival of her husband.  He is now here and she is taken to their vehicle without incident and she remains in no distress.

## 2013-10-27 ENCOUNTER — Ambulatory Visit (INDEPENDENT_AMBULATORY_CARE_PROVIDER_SITE_OTHER): Payer: Medicare Other | Admitting: Family Medicine

## 2013-10-27 VITALS — BP 138/72 | HR 89 | Temp 98.0°F | Resp 18

## 2013-10-27 DIAGNOSIS — S61409A Unspecified open wound of unspecified hand, initial encounter: Secondary | ICD-10-CM

## 2013-10-27 DIAGNOSIS — M79643 Pain in unspecified hand: Secondary | ICD-10-CM

## 2013-10-27 DIAGNOSIS — S61401A Unspecified open wound of right hand, initial encounter: Secondary | ICD-10-CM

## 2013-10-27 DIAGNOSIS — M79609 Pain in unspecified limb: Secondary | ICD-10-CM

## 2013-10-27 NOTE — Progress Notes (Signed)
This is an 80 year old woman who lacerated her right hand at the base of her long finger when she was pushing a glass into a refrigerator to get glass of water. The lever stuck and then the glass shattered lacerating her finger right at the MCP joint on the volar side of the long finger.  Patient is sure she's had a tetanus shot within 10 years. Her primary care doctor is Dr. Joya Salm  Objective: Patient in no acute distress Vision has a half centimeter laceration in the skin fold of the volar MCP joint of the right long finger  Patient has full range of motion of her right hand, normal sensation of the finger, and there is no active bleeding at the time she was seen.  Assessment: Simple laceration-please see note by physician assistant  Signed, Carola Frost.D.

## 2013-10-27 NOTE — Progress Notes (Signed)
Procedure:  Consent obtained - local anesthesia with 2% lido.  Wound cleaned and closed with 5-0 Ethilon #2 horizontal sutures.  Wound care d/w pt.

## 2013-10-27 NOTE — Patient Instructions (Signed)

## 2013-11-06 ENCOUNTER — Ambulatory Visit (INDEPENDENT_AMBULATORY_CARE_PROVIDER_SITE_OTHER): Payer: Medicare Other | Admitting: Physician Assistant

## 2013-11-06 VITALS — BP 134/74 | HR 94 | Temp 97.4°F | Resp 18 | Ht 64.0 in | Wt 128.0 lb

## 2013-11-06 DIAGNOSIS — S61401D Unspecified open wound of right hand, subsequent encounter: Secondary | ICD-10-CM

## 2013-11-06 DIAGNOSIS — Z5189 Encounter for other specified aftercare: Secondary | ICD-10-CM

## 2013-11-06 DIAGNOSIS — S61409A Unspecified open wound of unspecified hand, initial encounter: Secondary | ICD-10-CM

## 2013-11-06 NOTE — Progress Notes (Signed)
   Subjective:    Patient ID: CLEOTILDE SPADACCINI, female    DOB: December 15, 1928, 80 y.o.   MRN: [id number]  HPI 80 year old female presents for suture removal. DOI 10/27/13. Doing well without any issues or complaints. Denies any erythema, warmth or drainage. Wound seems to be healing well.  No other concerns today.     Review of Systems  Constitutional: Negative for fever and chills.  Gastrointestinal: Negative for nausea and vomiting.  Skin: Positive for wound. Negative for color change.  Neurological: Negative for weakness and numbness.  Psychiatric/Behavioral: Positive for sleep disturbance.       Objective:   Physical Exam  Constitutional: She is oriented to person, place, and time. She appears well-developed and well-nourished.  HENT:  Head: Normocephalic and atraumatic.  Right Ear: External ear normal.  Left Ear: External ear normal.  Eyes: Conjunctivae are normal.  Neck: Normal range of motion.  Cardiovascular: Normal rate.   Pulmonary/Chest: Effort normal.  Neurological: She is oriented to person, place, and time.  Skin:  Wound at bast of 3rd MCP joint with sutures in place.  No erythema, warmth, or drainage.   Psychiatric: She has a normal mood and affect. Her behavior is normal. Judgment and thought content normal.   #2 sutures removed without difficulty. Patient tolerated well.        Assessment & Plan:  . Wound, open, hand with or without fingers, right, subsequent encounter Suture removed Wound well healing Follow up as needed.

## 2013-12-14 NOTE — Assessment & Plan Note (Signed)
Astelin spray has worked fairly well

## 2013-12-14 NOTE — Assessment & Plan Note (Signed)
Emphasized reflux precautions 

## 2013-12-14 NOTE — Assessment & Plan Note (Signed)
Currently controlled and clear on exam but with demonstrated potential for severe exacerbation Plan-refill rescue inhaler

## 2013-12-31 ENCOUNTER — Emergency Department (HOSPITAL_COMMUNITY)
Admission: EM | Admit: 2013-12-31 | Discharge: 2013-12-31 | Disposition: A | Discharge status: Home / Self Care | Payer: Medicare Other | Attending: Emergency Medicine | Admitting: Emergency Medicine

## 2013-12-31 ENCOUNTER — Encounter (HOSPITAL_COMMUNITY): Payer: Self-pay | Admitting: Emergency Medicine

## 2013-12-31 DIAGNOSIS — E039 Hypothyroidism, unspecified: Secondary | ICD-10-CM | POA: Diagnosis not present

## 2013-12-31 DIAGNOSIS — IMO0002 Reserved for concepts with insufficient information to code with codable children: Secondary | ICD-10-CM | POA: Diagnosis not present

## 2013-12-31 DIAGNOSIS — K219 Gastro-esophageal reflux disease without esophagitis: Secondary | ICD-10-CM | POA: Insufficient documentation

## 2013-12-31 DIAGNOSIS — E785 Hyperlipidemia, unspecified: Secondary | ICD-10-CM | POA: Insufficient documentation

## 2013-12-31 DIAGNOSIS — R011 Cardiac murmur, unspecified: Secondary | ICD-10-CM | POA: Insufficient documentation

## 2013-12-31 DIAGNOSIS — K625 Hemorrhage of anus and rectum: Secondary | ICD-10-CM | POA: Diagnosis present

## 2013-12-31 DIAGNOSIS — M129 Arthropathy, unspecified: Secondary | ICD-10-CM | POA: Diagnosis not present

## 2013-12-31 DIAGNOSIS — Z79899 Other long term (current) drug therapy: Secondary | ICD-10-CM | POA: Diagnosis not present

## 2013-12-31 DIAGNOSIS — I10 Essential (primary) hypertension: Secondary | ICD-10-CM | POA: Insufficient documentation

## 2013-12-31 DIAGNOSIS — D509 Iron deficiency anemia, unspecified: Secondary | ICD-10-CM | POA: Diagnosis not present

## 2013-12-31 DIAGNOSIS — J45909 Unspecified asthma, uncomplicated: Secondary | ICD-10-CM | POA: Diagnosis not present

## 2013-12-31 LAB — CBC WITH DIFFERENTIAL/PLATELET
Basophils Absolute: 0 10*3/uL (ref 0.0–0.1)
Basophils Relative: 0 % (ref 0–1)
Eosinophils Absolute: 0.2 10*3/uL (ref 0.0–0.7)
Eosinophils Relative: 2 % (ref 0–5)
HEMATOCRIT: 40.7 % (ref 36.0–46.0)
HEMOGLOBIN: 13.4 g/dL (ref 12.0–15.0)
LYMPHS ABS: 2.1 10*3/uL (ref 0.7–4.0)
LYMPHS PCT: 22 % (ref 12–46)
MCH: 27.7 pg (ref 26.0–34.0)
MCHC: 32.9 g/dL (ref 30.0–36.0)
MCV: 84.3 fL (ref 78.0–100.0)
MONO ABS: 1 10*3/uL (ref 0.1–1.0)
Monocytes Relative: 10 % (ref 3–12)
Neutro Abs: 6.3 10*3/uL (ref 1.7–7.7)
Neutrophils Relative %: 66 % (ref 43–77)
Platelets: 301 10*3/uL (ref 150–400)
RBC: 4.83 MIL/uL (ref 3.87–5.11)
RDW: 14.8 % (ref 11.5–15.5)
WBC: 9.7 10*3/uL (ref 4.0–10.5)

## 2013-12-31 LAB — COMPREHENSIVE METABOLIC PANEL
ALT: 15 U/L (ref 0–35)
AST: 15 U/L (ref 0–37)
Albumin: 3.8 g/dL (ref 3.5–5.2)
Alkaline Phosphatase: 54 U/L (ref 39–117)
Anion gap: 13 (ref 5–15)
BUN: 16 mg/dL (ref 6–23)
CHLORIDE: 102 meq/L (ref 96–112)
CO2: 27 meq/L (ref 19–32)
CREATININE: 0.65 mg/dL (ref 0.50–1.10)
Calcium: 10.1 mg/dL (ref 8.4–10.5)
GFR calc Af Amer: >90 mL/min (ref >90)
GFR, EST NON AFRICAN AMERICAN: 80 mL/min — AB (ref >90)
Glucose, Bld: 94 mg/dL (ref 70–99)
Potassium: 3.6 mEq/L — ABNORMAL LOW (ref 3.7–5.3)
Sodium: 142 mEq/L (ref 137–147)
Total Bilirubin: 0.3 mg/dL (ref 0.3–1.2)
Total Protein: 7.8 g/dL (ref 6.0–8.3)

## 2013-12-31 LAB — URINALYSIS, ROUTINE W REFLEX MICROSCOPIC
Bilirubin Urine: NEGATIVE
Glucose, UA: NEGATIVE mg/dL
Hgb urine dipstick: NEGATIVE
KETONES UR: NEGATIVE mg/dL
LEUKOCYTES UA: NEGATIVE
Nitrite: NEGATIVE
PH: 7 (ref 5.0–8.0)
Protein, ur: NEGATIVE mg/dL
SPECIFIC GRAVITY, URINE: 1.006 (ref 1.005–1.030)
Urobilinogen, UA: 0.2 mg/dL (ref 0.0–1.0)

## 2013-12-31 LAB — LIPASE, BLOOD: Lipase: 27 U/L (ref 11–59)

## 2013-12-31 LAB — PROTIME-INR
INR: 1.03 (ref 0.00–1.49)
PROTHROMBIN TIME: 13.5 s (ref 11.6–15.2)

## 2013-12-31 LAB — LACTIC ACID, PLASMA: Lactic Acid, Venous: 1.5 mmol/L (ref 0.5–2.2)

## 2013-12-31 NOTE — Discharge Instructions (Signed)
Bloody Stools  Bloody stools often mean that there is a problem in the digestive tract. Your caregiver may use the term "melena" to describe black, tarry, and bad smelling stools or "hematochezia" to describe red or maroon-colored stools. Blood seen in the stool can be caused by bleeding anywhere along the intestinal tract.   A black stool usually means that blood is coming from the upper part of the gastrointestinal tract (esophagus, stomach, or small bowel). Passing maroon-colored stools or bright red blood usually means that blood is coming from lower down in the large bowel or the rectum. However, sometimes massive bleeding in the stomach or small intestine can cause bright red bloody stools.   Consuming black licorice, lead, iron pills, medicines containing bismuth subsalicylate, or blueberries can also cause black stools. Your caregiver can test black stools to see if blood is present.  It is important that the cause of the bleeding be found. Treatment can then be started, and the problem can be corrected. Rectal bleeding may not be serious, but you should not assume everything is okay until you know the cause. It is very important to follow up with your caregiver or a specialist in gastrointestinal problems.  CAUSES   Blood in the stools can come from various underlying causes. Often, the cause is not found during your first visit. Testing is often needed to discover the cause of bleeding in the gastrointestinal tract. Causes range from simple to serious or even life-threatening. Possible causes include:  · Hemorrhoids. These are veins that are full of blood (engorged) in the rectum. They cause pain, inflammation, and may bleed.  · Anal fissures. These are areas of painful tearing which may bleed. They are often caused by passing hard stool.  · Diverticulosis. These are pouches that form on the colon over time, with age, and may bleed significantly.  · Diverticulitis. This is inflammation in areas with  diverticulosis. It can cause pain, fever, and bloody stools, although bleeding is rare.  · Proctitis and colitis. These are inflamed areas of the rectum or colon. They may cause pain, fever, and bloody stools.  · Polyps and cancer. Colon cancer is a leading cause of preventable cancer death. It often starts out as precancerous polyps that can be removed during a colonoscopy, preventing progression into cancer. Sometimes, polyps and cancer may cause rectal bleeding.  · Gastritis and ulcers. Bleeding from the upper gastrointestinal tract (near the stomach) may travel through the intestines and produce black, sometimes tarry, often bad smelling stools. In certain cases, if the bleeding is fast enough, the stools may not be black, but red and the condition may be life-threatening.  SYMPTOMS   You may have stools that are bright red and bloody, that are normal color with blood on them, or that are dark black and tarry. In some cases, you may only have blood in the toilet bowl. Any of these cases need medical care. You may also have:  · Pain at the anus or anywhere in the rectum.  · Lightheadedness or feeling faint.  · Extreme weakness.  · Nausea or vomiting.  · Fever.  DIAGNOSIS  Your caregiver may use the following methods to find the cause of your bleeding:  · Taking a medical history. Age is important. Older people tend to develop polyps and cancer more often. If there is anal pain and a hard, large stool associated with bleeding, a tear of the anus may be the cause. If blood drips into the toilet after a bowel movement, bleeding hemorrhoids may be the   problem. The color and frequency of the bleeding are additional considerations. In most cases, the medical history provides clues, but seldom the final answer.  · A visual and finger (digital) exam. Your caregiver will inspect the anal area, looking for tears and hemorrhoids. A finger exam can provide information when there is tenderness or a growth inside. In men, the  prostate is also examined.  · Endoscopy. Several types of small, long scopes (endoscopes) are used to view the colon.  ¨ In the office, your caregiver may use a rigid, or more commonly, a flexible viewing sigmoidoscope. This exam is called flexible sigmoidoscopy. It is performed in 5 to 10 minutes.  ¨ A more thorough exam is accomplished with a colonoscope. It allows your caregiver to view the entire 5 to 6 foot long colon. Medicine to help you relax (sedative) is usually given for this exam. Frequently, a bleeding lesion may be present beyond the reach of the sigmoidoscope. So, a colonoscopy may be the best exam to start with. Both exams are usually done on an outpatient basis. This means the patient does not stay overnight in the hospital or surgery center.  ¨ An upper endoscopy may be needed to examine your stomach. Sedation is used and a flexible endoscope is put in your mouth, down to your stomach.  · A barium enema X-ray. This is an X-ray exam. It uses liquid barium inserted by enema into the rectum. This test alone may not identify an actual bleeding point. X-rays highlight abnormal shadows, such as those made by lumps (tumors), diverticuli, or colitis.  TREATMENT   Treatment depends on the cause of your bleeding.   · For bleeding from the stomach or colon, the caregiver doing your endoscopy or colonoscopy may be able to stop the bleeding as part of the procedure.  · Inflammation or infection of the colon can be treated with medicines.  · Many rectal problems can be treated with creams, suppositories, or warm baths.  · Surgery is sometimes needed.  · Blood transfusions are sometimes needed if you have lost a lot of blood.  · For any bleeding problem, let your caregiver know if you take aspirin or other blood thinners regularly.  HOME CARE INSTRUCTIONS   · Take any medicines exactly as prescribed.  · Keep your stools soft by eating a diet high in fiber. Prunes (1 to 3 a day) work well for many people.  · Drink  enough water and fluids to keep your urine clear or pale yellow.  · Take sitz baths if advised. A sitz bath is when you sit in a bathtub with warm water for 10 to 15 minutes to soak, soothe, and cleanse the rectal area.  · If enemas or suppositories are advised, be sure you know how to use them. Tell your caregiver if you have problems with this.  · Monitor your bowel movements to look for signs of improvement or worsening.  SEEK MEDICAL CARE IF:   · You do not improve in the time expected.  · Your condition worsens after initial improvement.  · You develop any new symptoms.  SEEK IMMEDIATE MEDICAL CARE IF:   · You develop severe or prolonged rectal bleeding.  · You vomit blood.  · You feel weak or faint.  · You have a fever.  MAKE SURE YOU:  · Understand these instructions.  · Will watch your condition.  · Will get help right away if you are not doing well or get worse.    Document Released: 04/17/2002 Document Revised: 07/20/2011 Document Reviewed: 09/12/2010  [employer]® Patient Information ©2015 [employer]. This information is not intended to replace advice given to you by your health care provider. Make sure you discuss any questions you have with your health care provider.

## 2013-12-31 NOTE — ED Provider Notes (Signed)
CSN: [id number]     Arrival date & time 12/31/13  0802 History   First MD Initiated Contact with Patient 12/31/13 [id number]     Chief Complaint  Patient presents with  . Rectal Bleeding     (Consider location/radiation/quality/duration/timing/severity/associated sxs/prior Treatment) Patient is a 80 y.o. female presenting with hematochezia. The history is provided by the patient.  Rectal Bleeding Quality:  Bright red (w/ some clots) Amount:  Moderate Duration:  2 hours Timing:  Constant Progression:  Unchanged Chronicity:  Recurrent Context: diarrhea and spontaneously   Context comment:  Hx of UC Similar prior episodes: yes (not as bad as previous in Aug of 2014)   Relieved by:  Nothing Worsened by:  Nothing tried Ineffective treatments:  None tried Associated symptoms: no abdominal pain, no dizziness, no fever and no vomiting     Past Medical History  Diagnosis Date  . Ulcerative colitis   . Hypertension   . Hyperlipemia   . Diverticulitis, colon   . Other specified iron deficiency anemias   . Thrombocytosis   . Leukocytosis   . Esophageal reflux   . Chronic pharyngitis   . Allergic rhinitis   . Acute asthmatic bronchitis   . Hypothyroidism   . Cardiac murmur     DOES NOT CAUSE ANY SYMPTOMS  . Recurrent upper respiratory infection (URI)     ON 07/10/11 PT SAW DR. Tarri Fuller YOUNG FOR TX OF ACUTE BRONCHITIS --GIVEN  EXTRA PREDNISONE ( IN ADDITION TO THE DAILY PREDNISONE PT TAKES)  AND PT FINISHED A Z-PAK--STILL HAS A COUGH TODAY 07/15/11-NO FEVER.  Marland Kitchen Blood transfusion   . Arthritis     HX OF RT SHOULDER BURSITIS-AND THE SHOULDER IS HURTING AT PRESENT, PAIN IN LEFT KNEE AT PRESENT  AND PAIN IN LEFT HIP  . IBS (irritable bowel syndrome)   . Balance problem     SINCE BRAIN TUMOR REMOVED IN 2002-BENIGN TUMOR-PT HAS ADRENAL INSUFFICIENCY AND TAKES DAILY PREDNISONE  . GI bleed 01/06/2013   Past Surgical History  Procedure Laterality Date  . Partial colectomy  2008  . Total hip  arthroplasty  OCT 2006    right  . Vesicovaginal fistula closure w/ tah    . Thyroidectomy  1986  . Appendectomy    . Craniotomy    . Subglottal mucocoel  2000  . Meningioma resected  20O2  . Frontalis suspension  10-09-2010    lifting eyelids AND SECOND EYE SURGERY November 19, 2010  . Tonsillectomy  1938  . Abdominal hysterectomy  1985  . Dilation and curettage of uterus  1967  . Eye surgery      2003 -RIGHT CATARACT EXTRACTED AND LEFT WAS DONE IN 2004  . Wrist tendon lesion removed 2007    . Total hip arthroplasty  07/27/2011    Procedure: TOTAL HIP ARTHROPLASTY;  Surgeon: Gearlean Alf, MD;  Location: [facility];  Service: Orthopedics;  Laterality: Left;  . Colonoscopy N/A 01/05/2013    Procedure: COLONOSCOPY;  Surgeon: Cleotis Nipper, MD;  Location: [facility] END[facility]COPY;  Service: Endoscopy;  Laterality: N/A;  . Esophagogastroduodenoscopy N/A 01/05/2013    Procedure: ESOPHAGOGASTRODUODENOSCOPY (EGD);  Surgeon: Cleotis Nipper, MD;  Location: [facility] ENDOSCOPY;  Service: Endoscopy;  Laterality: N/A;   Family History  Problem Relation Age of Onset  . Heart attack Father     deceased   History  Substance Use Topics  . Smoking status: Never Smoker   . Smokeless tobacco: Never Used  . Alcohol Use: Yes  Comment: occ glass of wine   OB History   Grav Para Term Preterm Abortions TAB SAB Ect Mult Living                 Review of Systems  Constitutional: Negative for fever and fatigue.  HENT: Negative for congestion and drooling.   Eyes: Negative for pain.  Respiratory: Negative for cough and shortness of breath.   Cardiovascular: Negative for chest pain.  Gastrointestinal: Positive for hematochezia. Negative for nausea, vomiting, abdominal pain and diarrhea.  Genitourinary: Negative for dysuria and hematuria.       Brb in stool  Musculoskeletal: Negative for back pain, gait problem and neck pain.  Skin: Negative for color change.  Neurological: Negative for dizziness and headaches.   Hematological: Negative for adenopathy.  Psychiatric/Behavioral: Negative for behavioral problems.  All other systems reviewed and are negative.     Allergies  Biaxin; Ciprofloxacin; Codeine; Doxycycline; Phenytoin; Restasis; and Sulfonamide derivatives  Home Medications   Prior to Admission medications   Medication Sig Start Date End Date Taking? Authorizing Provider  acetaminophen (TYLENOL) 500 MG tablet Take 500 mg by mouth every 6 (six) hours as needed for mild pain.   Yes Historical Provider, MD  albuterol (PROVENTIL HFA;VENTOLIN HFA) 108 (90 BASE) MCG/ACT inhaler Inhale 2 puffs into the lungs every 6 (six) hours as needed for wheezing or shortness of breath. 10/11/13  Yes Deneise Lever, MD  albuterol (PROVENTIL) (2.5 MG/3ML) 0.083% nebulizer solution Take 3 mLs (2.5 mg total) by nebulization every 6 (six) hours as needed for wheezing or shortness of breath. DX 493.90 06/14/13  Yes Deneise Lever, MD  amLODipine (NORVASC) 2.5 MG tablet Take 2.5 mg by mouth daily after breakfast.   Yes Historical Provider, MD  Artificial Tear Solution (BION TEARS) 0.1-0.3 % SOLN Place 1 drop into both eyes 2 (two) times daily as needed (dry eyes).    Yes Historical Provider, MD  B Complex-C (B-COMPLEX WITH VITAMIN C) tablet Take 1 tablet by mouth daily.   Yes Historical Provider, MD  Biotin 5000 MCG CAPS Take 5,000 mcg by mouth daily.   Yes Historical Provider, MD  cholecalciferol (VITAMIN D) 1000 UNITS tablet Take 1,000 Units by mouth daily.   Yes Historical Provider, MD  fexofenadine (ALLEGRA) 180 MG tablet Take 180 mg by mouth daily. For Allergies 07/09/11  Yes Deneise Lever, MD  fluticasone (FLOVENT HFA) 110 MCG/ACT inhaler Inhale 2 puffs into the lungs 2 (two) times daily.   Yes Historical Provider, MD  ibuprofen (ADVIL,MOTRIN) 200 MG tablet Take 400 mg by mouth every 6 (six) hours as needed for moderate pain.   Yes Historical Provider, MD  levothyroxine (SYNTHROID, LEVOTHROID) 75 MCG tablet Take  75 mcg by mouth daily before breakfast.    Yes Historical Provider, MD  LORazepam (ATIVAN) 0.5 MG tablet Take 0.5 mg by mouth at bedtime as needed for sleep.    Yes Historical Provider, MD  Multiple Vitamin (MULTIVITAMIN) tablet Take 1 tablet by mouth daily.   Yes Historical Provider, MD  omeprazole (PRILOSEC) 20 MG capsule Take 1 capsule (20 mg total) by mouth 2 (two) times daily before a meal. 06/27/13  Yes Brandi L Ollis, NP   BP 148/74  Pulse 103  Temp(Src) 98 F (36.7 C) (Oral)  Resp 20  SpO2 98% Physical Exam  Nursing note and vitals reviewed. Constitutional: She is oriented to person, place, and time. She appears well-developed and well-nourished.  HENT:  Head: Normocephalic and atraumatic.  Mouth/Throat: Oropharynx is clear and moist. No oropharyngeal exudate.  Eyes: Conjunctivae and EOM are normal. Pupils are equal, round, and reactive to light.  Neck: Normal range of motion. Neck supple.  Cardiovascular: Normal rate, regular rhythm, normal heart sounds and intact distal pulses.  Exam reveals no gallop and no friction rub.   No murmur heard. Pulmonary/Chest: Effort normal and breath sounds normal. No respiratory distress. She has no wheezes.  Abdominal: Soft. Bowel sounds are normal. There is no tenderness. There is no rebound and no guarding.  Genitourinary:  Normal appearing external rectum. Maroon colored stool.   Musculoskeletal: Normal range of motion. She exhibits no edema and no tenderness.  Neurological: She is alert and oriented to person, place, and time.  Skin: Skin is warm and dry.  Psychiatric: She has a normal mood and affect. Her behavior is normal.    ED Course  Procedures (including critical care time) Labs Review Labs Reviewed  COMPREHENSIVE METABOLIC PANEL - Abnormal; Notable for the following:    Potassium 3.6 (*)    GFR calc non Af Amer 80 (*)    All other components within normal limits  CBC WITH DIFFERENTIAL  PROTIME-INR  LIPASE, BLOOD  LACTIC  ACID, PLASMA  URINALYSIS, ROUTINE W REFLEX MICROSCOPIC    Imaging Review No results found.   EKG Interpretation None      MDM   Final diagnoses:  Rectal bleeding    8:43 AM 80 y.o. female w hx of UC, HTN, diverticulitis who pw brb mixed w/ diarrhea x 2 this morning. Similar episode 1 year ago, colonoscopy by Buccini at that time suspicious for diverticular source of bleeding. Pt denies cp, sob, vomiting, abd pain or other assoc sx. AFVSS here. Will get screening labs and monitor.   10:49 AM: I interpreted/reviewed the labs and/or imaging which were non-contributory. Pt remains asx. No bloody stools here. Already on a ppi. I discussed admission versus discharge home. She is happy with discharge home. Will have her follow up closely with GI tomorrow. I have discussed the diagnosis/risks/treatment options with the patient and believe the pt to be eligible for discharge home to follow-up with Dr. Oletta Lamas (her gi doc). We also discussed returning to the ED immediately if new or worsening sx occur. We discussed the sx which are most concerning (e.g., worsening bleeding, sob, fatigue, syncope, cp) that necessitate immediate return. Medications administered to the patient during their visit and any new prescriptions provided to the patient are listed below.  Medications given during this visit Medications - No data to display  New Prescriptions   No medications on file      Pamella Pert, MD 12/31/13 1050

## 2013-12-31 NOTE — ED Notes (Signed)
She noted a sm. Quantity of red blood in stool.  She states she had a similar episode 1 year ago.  She denies any pain or discomfort and is in no distress.

## 2014-01-03 ENCOUNTER — Observation Stay (HOSPITAL_COMMUNITY)
Admission: EM | Admit: 2014-01-03 | Discharge: 2014-01-05 | Disposition: A | Discharge status: Home / Self Care | Payer: Medicare Other | Attending: Internal Medicine | Admitting: Internal Medicine

## 2014-01-03 ENCOUNTER — Observation Stay (HOSPITAL_COMMUNITY): Payer: Medicare Other

## 2014-01-03 ENCOUNTER — Other Ambulatory Visit: Payer: Self-pay | Admitting: Gastroenterology

## 2014-01-03 ENCOUNTER — Encounter (HOSPITAL_COMMUNITY): Payer: Self-pay | Admitting: Emergency Medicine

## 2014-01-03 DIAGNOSIS — D72829 Elevated white blood cell count, unspecified: Secondary | ICD-10-CM

## 2014-01-03 DIAGNOSIS — Z980 Intestinal bypass and anastomosis status: Secondary | ICD-10-CM | POA: Insufficient documentation

## 2014-01-03 DIAGNOSIS — K5140 Inflammatory polyps of colon without complications: Secondary | ICD-10-CM | POA: Insufficient documentation

## 2014-01-03 DIAGNOSIS — Z8719 Personal history of other diseases of the digestive system: Secondary | ICD-10-CM | POA: Diagnosis not present

## 2014-01-03 DIAGNOSIS — D509 Iron deficiency anemia, unspecified: Secondary | ICD-10-CM | POA: Insufficient documentation

## 2014-01-03 DIAGNOSIS — Z9049 Acquired absence of other specified parts of digestive tract: Secondary | ICD-10-CM | POA: Diagnosis not present

## 2014-01-03 DIAGNOSIS — Z9089 Acquired absence of other organs: Secondary | ICD-10-CM | POA: Diagnosis not present

## 2014-01-03 DIAGNOSIS — Z9889 Other specified postprocedural states: Secondary | ICD-10-CM | POA: Insufficient documentation

## 2014-01-03 DIAGNOSIS — R011 Cardiac murmur, unspecified: Secondary | ICD-10-CM | POA: Diagnosis not present

## 2014-01-03 DIAGNOSIS — K219 Gastro-esophageal reflux disease without esophagitis: Secondary | ICD-10-CM | POA: Diagnosis not present

## 2014-01-03 DIAGNOSIS — IMO0002 Reserved for concepts with insufficient information to code with codable children: Secondary | ICD-10-CM | POA: Diagnosis not present

## 2014-01-03 DIAGNOSIS — K5190 Ulcerative colitis, unspecified, without complications: Secondary | ICD-10-CM | POA: Diagnosis present

## 2014-01-03 DIAGNOSIS — J309 Allergic rhinitis, unspecified: Secondary | ICD-10-CM | POA: Insufficient documentation

## 2014-01-03 DIAGNOSIS — J45909 Unspecified asthma, uncomplicated: Secondary | ICD-10-CM | POA: Diagnosis not present

## 2014-01-03 DIAGNOSIS — K648 Other hemorrhoids: Secondary | ICD-10-CM | POA: Diagnosis not present

## 2014-01-03 DIAGNOSIS — E2740 Unspecified adrenocortical insufficiency: Secondary | ICD-10-CM | POA: Diagnosis present

## 2014-01-03 DIAGNOSIS — I10 Essential (primary) hypertension: Secondary | ICD-10-CM | POA: Diagnosis not present

## 2014-01-03 DIAGNOSIS — K625 Hemorrhage of anus and rectum: Secondary | ICD-10-CM

## 2014-01-03 DIAGNOSIS — K5731 Diverticulosis of large intestine without perforation or abscess with bleeding: Secondary | ICD-10-CM

## 2014-01-03 DIAGNOSIS — K5790 Diverticulosis of intestine, part unspecified, without perforation or abscess without bleeding: Secondary | ICD-10-CM | POA: Diagnosis present

## 2014-01-03 DIAGNOSIS — K5180 Other ulcerative colitis without complications: Secondary | ICD-10-CM | POA: Insufficient documentation

## 2014-01-03 DIAGNOSIS — K5730 Diverticulosis of large intestine without perforation or abscess without bleeding: Secondary | ICD-10-CM | POA: Diagnosis not present

## 2014-01-03 DIAGNOSIS — E039 Hypothyroidism, unspecified: Secondary | ICD-10-CM | POA: Diagnosis present

## 2014-01-03 DIAGNOSIS — K921 Melena: Secondary | ICD-10-CM | POA: Insufficient documentation

## 2014-01-03 DIAGNOSIS — E2749 Other adrenocortical insufficiency: Secondary | ICD-10-CM | POA: Diagnosis not present

## 2014-01-03 DIAGNOSIS — J4530 Mild persistent asthma, uncomplicated: Secondary | ICD-10-CM | POA: Diagnosis present

## 2014-01-03 DIAGNOSIS — E785 Hyperlipidemia, unspecified: Secondary | ICD-10-CM | POA: Diagnosis present

## 2014-01-03 DIAGNOSIS — Z90710 Acquired absence of both cervix and uterus: Secondary | ICD-10-CM | POA: Diagnosis not present

## 2014-01-03 HISTORY — DX: Hemorrhage of anus and rectum: K62.5

## 2014-01-03 LAB — COMPREHENSIVE METABOLIC PANEL
ALK PHOS: 52 U/L (ref 39–117)
ALT: 14 U/L (ref 0–35)
AST: 19 U/L (ref 0–37)
Albumin: 3.8 g/dL (ref 3.5–5.2)
Anion gap: 15 (ref 5–15)
BILIRUBIN TOTAL: 0.5 mg/dL (ref 0.3–1.2)
BUN: 16 mg/dL (ref 6–23)
CALCIUM: 10 mg/dL (ref 8.4–10.5)
CHLORIDE: 99 meq/L (ref 96–112)
CO2: 24 mEq/L (ref 19–32)
Creatinine, Ser: 0.73 mg/dL (ref 0.50–1.10)
GFR calc Af Amer: 90 mL/min — ABNORMAL LOW (ref >90)
GFR, EST NON AFRICAN AMERICAN: 77 mL/min — AB (ref >90)
GLUCOSE: 124 mg/dL — AB (ref 70–99)
POTASSIUM: 3.9 meq/L (ref 3.7–5.3)
SODIUM: 138 meq/L (ref 137–147)
Total Protein: 7.4 g/dL (ref 6.0–8.3)

## 2014-01-03 LAB — TSH: TSH: 0.826 u[IU]/mL (ref 0.350–4.500)

## 2014-01-03 LAB — CBC
HCT: 36.8 % (ref 36.0–46.0)
Hemoglobin: 12.2 g/dL (ref 12.0–15.0)
MCH: 27.9 pg (ref 26.0–34.0)
MCHC: 33.2 g/dL (ref 30.0–36.0)
MCV: 84 fL (ref 78.0–100.0)
Platelets: 287 10*3/uL (ref 150–400)
RBC: 4.38 MIL/uL (ref 3.87–5.11)
RDW: 14.8 % (ref 11.5–15.5)
WBC: 10.8 10*3/uL — AB (ref 4.0–10.5)

## 2014-01-03 LAB — CBC WITH DIFFERENTIAL/PLATELET
BASOS ABS: 0 10*3/uL (ref 0.0–0.1)
Basophils Relative: 0 % (ref 0–1)
Eosinophils Absolute: 0 10*3/uL (ref 0.0–0.7)
Eosinophils Relative: 0 % (ref 0–5)
HCT: 37.7 % (ref 36.0–46.0)
Hemoglobin: 12.3 g/dL (ref 12.0–15.0)
LYMPHS ABS: 1.3 10*3/uL (ref 0.7–4.0)
Lymphocytes Relative: 12 % (ref 12–46)
MCH: 27.5 pg (ref 26.0–34.0)
MCHC: 32.6 g/dL (ref 30.0–36.0)
MCV: 84.3 fL (ref 78.0–100.0)
Monocytes Absolute: 0.6 10*3/uL (ref 0.1–1.0)
Monocytes Relative: 5 % (ref 3–12)
NEUTROS ABS: 9.7 10*3/uL — AB (ref 1.7–7.7)
NEUTROS PCT: 83 % — AB (ref 43–77)
PLATELETS: 287 10*3/uL (ref 150–400)
RBC: 4.47 MIL/uL (ref 3.87–5.11)
RDW: 14.8 % (ref 11.5–15.5)
WBC: 11.6 10*3/uL — AB (ref 4.0–10.5)

## 2014-01-03 LAB — I-STAT CHEM 8, ED
BUN: 15 mg/dL (ref 6–23)
CALCIUM ION: 1.22 mmol/L (ref 1.13–1.30)
CREATININE: 0.8 mg/dL (ref 0.50–1.10)
Chloride: 106 mEq/L (ref 96–112)
GLUCOSE: 121 mg/dL — AB (ref 70–99)
HCT: 39 % (ref 36.0–46.0)
HEMOGLOBIN: 13.3 g/dL (ref 12.0–15.0)
POTASSIUM: 3.8 meq/L (ref 3.7–5.3)
Sodium: 138 mEq/L (ref 137–147)
TCO2: 23 mmol/L (ref 0–100)

## 2014-01-03 LAB — TYPE AND SCREEN
ABO/RH(D): A POS
Antibody Screen: NEGATIVE

## 2014-01-03 LAB — MAGNESIUM: Magnesium: 1.9 mg/dL (ref 1.5–2.5)

## 2014-01-03 LAB — PHOSPHORUS: PHOSPHORUS: 2.7 mg/dL (ref 2.3–4.6)

## 2014-01-03 MED ORDER — BION TEARS 0.1-0.3 % OP SOLN: 1.0000 [drp] | Freq: Two times a day (BID) | OPHTHALMIC | Status: DC | PRN

## 2014-01-03 MED ORDER — CYANOCOBALAMIN 250 MCG PO TABS
250.0000 ug | ORAL_TABLET | Freq: Every day | ORAL | Status: DC
  Administered 2014-01-04 – 2014-01-05 (×2): 250 ug via ORAL
  Filled 2014-01-03 (×2): qty 1

## 2014-01-03 MED ORDER — PREDNISONE 5 MG PO TABS
5.0000 mg | ORAL_TABLET | Freq: Every day | ORAL | Status: DC
  Administered 2014-01-05: 5 mg via ORAL
  Filled 2014-01-03 (×3): qty 1

## 2014-01-03 MED ORDER — AMLODIPINE BESYLATE 2.5 MG PO TABS
2.5000 mg | ORAL_TABLET | Freq: Every day | ORAL | Status: DC
  Administered 2014-01-04 – 2014-01-05 (×2): 2.5 mg via ORAL
  Filled 2014-01-03 (×2): qty 1

## 2014-01-03 MED ORDER — VITAMIN B-12 250 MCG PO TABS: 250.0000 ug | ORAL_TABLET | Freq: Every day | ORAL | Status: DC

## 2014-01-03 MED ORDER — ONDANSETRON HCL 4 MG/2ML IJ SOLN: 4.0000 mg | Freq: Four times a day (QID) | INTRAMUSCULAR | Status: DC | PRN

## 2014-01-03 MED ORDER — ALBUTEROL SULFATE (2.5 MG/3ML) 0.083% IN NEBU: 2.5000 mg | INHALATION_SOLUTION | Freq: Four times a day (QID) | RESPIRATORY_TRACT | Status: DC | PRN

## 2014-01-03 MED ORDER — ONDANSETRON HCL 4 MG PO TABS: 4.0000 mg | ORAL_TABLET | Freq: Four times a day (QID) | ORAL | Status: DC | PRN

## 2014-01-03 MED ORDER — ADULT MULTIVITAMIN W/MINERALS CH
1.0000 | ORAL_TABLET | Freq: Every day | ORAL | Status: DC
  Administered 2014-01-04 – 2014-01-05 (×2): 1 via ORAL
  Filled 2014-01-03 (×2): qty 1

## 2014-01-03 MED ORDER — LORAZEPAM 0.5 MG PO TABS: 0.5000 mg | ORAL_TABLET | Freq: Every evening | ORAL | Status: DC | PRN

## 2014-01-03 MED ORDER — SODIUM CHLORIDE 0.9 % IV BOLUS (SEPSIS)
1000.0000 mL | Freq: Once | INTRAVENOUS | Status: AC
  Administered 2014-01-03: 1000 mL via INTRAVENOUS

## 2014-01-03 MED ORDER — PEG 3350-KCL-NA BICARB-NACL 420 G PO SOLR
4000.0000 mL | Freq: Once | ORAL | Status: AC
  Administered 2014-01-03: 4000 mL via ORAL
  Filled 2014-01-03: qty 4000

## 2014-01-03 MED ORDER — LEVOTHYROXINE SODIUM 75 MCG PO TABS
75.0000 ug | ORAL_TABLET | Freq: Every day | ORAL | Status: DC
  Administered 2014-01-04 – 2014-01-05 (×2): 75 ug via ORAL
  Filled 2014-01-03 (×3): qty 1

## 2014-01-03 MED ORDER — FLUTICASONE PROPIONATE HFA 110 MCG/ACT IN AERO
2.0000 | INHALATION_SPRAY | Freq: Two times a day (BID) | RESPIRATORY_TRACT | Status: DC
  Administered 2014-01-03 – 2014-01-05 (×4): 2 via RESPIRATORY_TRACT
  Filled 2014-01-03: qty 12

## 2014-01-03 MED ORDER — ACETAMINOPHEN 500 MG PO TABS: 500.0000 mg | ORAL_TABLET | Freq: Four times a day (QID) | ORAL | Status: DC | PRN

## 2014-01-03 MED ORDER — ONE-DAILY MULTI VITAMINS PO TABS: 1.0000 | ORAL_TABLET | Freq: Every day | ORAL | Status: DC

## 2014-01-03 MED ORDER — VITAMIN D3 25 MCG (1000 UNIT) PO TABS
1000.0000 [IU] | ORAL_TABLET | Freq: Every day | ORAL | Status: DC
Start: 2014-01-03 — End: 2014-01-05
  Administered 2014-01-03 – 2014-01-05 (×3): 1000 [IU] via ORAL
  Filled 2014-01-03 (×3): qty 1

## 2014-01-03 MED ORDER — SODIUM CHLORIDE 0.9 % IV SOLN
INTRAVENOUS | Status: DC
  Administered 2014-01-03: 19:00:00 via INTRAVENOUS

## 2014-01-03 MED ORDER — POLYVINYL ALCOHOL 1.4 % OP SOLN
1.0000 [drp] | Freq: Two times a day (BID) | OPHTHALMIC | Status: DC | PRN
  Filled 2014-01-03: qty 15

## 2014-01-03 MED ORDER — LORATADINE 10 MG PO TABS
10.0000 mg | ORAL_TABLET | Freq: Every day | ORAL | Status: DC
  Administered 2014-01-03 – 2014-01-05 (×3): 10 mg via ORAL
  Filled 2014-01-03 (×3): qty 1

## 2014-01-03 MED ORDER — PANTOPRAZOLE SODIUM 40 MG PO TBEC
40.0000 mg | DELAYED_RELEASE_TABLET | Freq: Two times a day (BID) | ORAL | Status: DC
  Administered 2014-01-03 – 2014-01-05 (×4): 40 mg via ORAL
  Filled 2014-01-03 (×6): qty 1

## 2014-01-03 NOTE — Consult Note (Signed)
[facility] CON[facility]ULT Reason for consult: G.I. bleeding Referring Physician: PCP: Dr. Johney Frame Sierra [name] is an 80 y.o. female.  HPI: 80 year old well-known to me. She has a significant number problems. She's had a history of ulcerative colitis and has had severe diverticular disease with previous colon resection for perforated diverticulosis. She got overall this. She has adrenal insufficiency because of removal of the benign meningio felt that the chronic prednisone that she takes has been keeping her ulcerative colitis under control. A year or so ago she was hospitalized for acute bleeding it was felt to be a diverticular bleed. She went to the emergency room a few days ago after having bright red blood per at him without preceding diarrhea. Hemoglobin was 13 and was checked subsequently in our office and was about the same. We had her setup for elective colonoscopy tomorrow that she began to have increasing bleeding and came back to the emergency room. 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  Past Medical History  Diagnosis Date  . Ulcerative colitis   . Hypertension   . Hyperlipemia   . Diverticulitis, colon   . Other specified iron deficiency anemias   . Thrombocytosis   . Leukocytosis   . Esophageal reflux   . Chronic pharyngitis   . Allergic rhinitis   . Acute asthmatic bronchitis   . Hypothyroidism   . Cardiac murmur     DOES NOT CAUSE ANY SYMPTOMS  . Recurrent upper respiratory infection (URI)     ON 07/10/11 PT SAW DR. Tarri Fuller YOUNG FOR TX OF ACUTE BRONCHITIS --GIVEN  EXTRA PREDNISONE ( IN ADDITION TO THE DAILY PREDNISONE PT TAKES)  AND PT FINISHED A Z-PAK--STILL HAS A COUGH TODAY 07/15/11-NO FEVER.  Marland Kitchen Blood transfusion   . Arthritis     HX OF RT SHOULDER BURSITIS-AND THE SHOULDER IS HURTING AT PRESENT, PAIN IN LEFT KNEE  AT PRESENT  AND PAIN IN LEFT HIP  . IBS (irritable bowel syndrome)   . Balance problem     SINCE BRAIN TUMOR REMOVED IN 2002-BENIGN TUMOR-PT HAS ADRENAL INSUFFICIENCY AND TAKES DAILY PREDNISONE  . GI bleed 01/06/2013    Past Surgical History  Procedure Laterality Date  . Partial colectomy  2008  . Total hip arthroplasty  OCT 2006    right  . Vesicovaginal fistula closure w/ tah    . Thyroidectomy  1986  . Appendectomy    . Craniotomy    . Subglottal mucocoel  2000  . Meningioma resected  20O2  . Frontalis suspension  10-09-2010    lifting eyelids AND SECOND EYE SURGERY November 19, 2010  . Tonsillectomy  1938  . Abdominal hysterectomy  1985  . Dilation and curettage of uterus  1967  . Eye surgery      2003 -RIGHT CATARACT EXTRACTED AND LEFT WAS DONE IN 2004  . Wrist tendon lesion removed 2007    . Total hip arthroplasty  07/27/2011    Procedure: TOTAL HIP ARTHROPLASTY;  Surgeon: Gearlean Alf, MD;  Location: [facility];  Service: Orthopedics;  Laterality: Left;  . Colonoscopy N/A 01/05/2013    Procedure: COLONOSCOPY;  Surgeon: Cleotis Nipper, MD;  Location: [facility] ENDOSCOPY;  Service: Endoscopy;  Laterality: N/A;  . Esophagogastroduodenoscopy N/A 01/05/2013    Procedure: ESOPHAGOGASTRODUODENOSCOPY (EGD);  Surgeon: Cleotis Nipper, MD;  Location: [facility] ENDOSCOPY;  Service: Endoscopy;  Laterality: N/A;    Family  History  Problem Relation Age of Onset  . Heart attack Father     deceased    Social History:  reports that she has never smoked. She has never used smokeless tobacco. She reports that she drinks alcohol. She reports that she does not use illicit drugs.  Allergies:  Allergies  Allergen Reactions  . Biaxin [Clarithromycin]     Bitter taste  . Ciprofloxacin Rash  . Codeine Nausea And Vomiting  . Doxycycline Nausea Only    Very nauseated  . Phenytoin Rash  . Restasis [Cyclosporine] Other (See Comments)    Eyes burn   . Sulfonamide Derivatives Rash    Medications; Prior  to Admission medications   Medication Sig Start Date End Date Taking? Authorizing Provider  acetaminophen (TYLENOL) 500 MG tablet Take 500 mg by mouth every 6 (six) hours as needed for mild pain.   Yes Historical Provider, MD  albuterol (PROVENTIL HFA;VENTOLIN HFA) 108 (90 BASE) MCG/ACT inhaler Inhale 2 puffs into the lungs every 6 (six) hours as needed for wheezing or shortness of breath. 10/11/13  Yes Deneise Lever, MD  albuterol (PROVENTIL) (2.5 MG/3ML) 0.083% nebulizer solution Take 3 mLs (2.5 mg total) by nebulization every 6 (six) hours as needed for wheezing or shortness of breath. DX 493.90 06/14/13  Yes Deneise Lever, MD  albuterol-ipratropium (COMBIVENT) 18-103 MCG/ACT inhaler Inhale 1 puff into the lungs once as needed for wheezing or shortness of breath.   Yes Historical Provider, MD  amLODipine (NORVASC) 2.5 MG tablet Take 2.5 mg by mouth daily after breakfast.   Yes Historical Provider, MD  Artificial Tear Solution (BION TEARS) 0.1-0.3 % SOLN Place 1 drop into both eyes 2 (two) times daily as needed (dry eyes).    Yes Historical Provider, MD  B Complex-C (B-COMPLEX WITH VITAMIN C) tablet Take 1 tablet by mouth daily.   Yes Historical Provider, MD  Biotin 5000 MCG CAPS Take 5,000 mcg by mouth daily.   Yes Historical Provider, MD  cholecalciferol (VITAMIN D) 1000 UNITS tablet Take 1,000 Units by mouth daily.   Yes Historical Provider, MD  fexofenadine (ALLEGRA) 180 MG tablet Take 180 mg by mouth daily. For Allergies 07/09/11  Yes Deneise Lever, MD  fluticasone (FLOVENT HFA) 110 MCG/ACT inhaler Inhale 2 puffs into the lungs 2 (two) times daily.   Yes Historical Provider, MD  ibuprofen (ADVIL,MOTRIN) 200 MG tablet Take 400 mg by mouth every 6 (six) hours as needed for moderate pain.   Yes Historical Provider, MD  levothyroxine (SYNTHROID, LEVOTHROID) 75 MCG tablet Take 75 mcg by mouth daily before breakfast.    Yes Historical Provider, MD  LORazepam (ATIVAN) 0.5 MG tablet Take 0.5 mg by  mouth at bedtime as needed for sleep.    Yes Historical Provider, MD  Multiple Vitamin (MULTIVITAMIN) tablet Take 1 tablet by mouth daily.   Yes Historical Provider, MD  omeprazole (PRILOSEC) 20 MG capsule Take 1 capsule (20 mg total) by mouth 2 (two) times daily before a meal. 06/27/13  Yes Donita Brooks, NP  predniSONE (DELTASONE) 5 MG tablet Take 5 mg by mouth daily with breakfast.   Yes Historical Provider, MD  vitamin B-12 (CYANOCOBALAMIN) 250 MCG tablet Take 250 mcg by mouth daily.   Yes Historical Provider, MD   . polyethylene glycol-electrolytes  4,000 mL Oral Once   PRN Meds  Results for orders placed during the hospital encounter of 01/03/14 (from the past 48 hour(s))  CBC WITH DIFFERENTIAL     Status:  Abnormal   Collection Time    01/03/14  2:46 PM      Result Value Ref Range   WBC 11.6 (*) 4.0 - 10.5 K/uL   RBC 4.47  3.87 - 5.11 MIL/uL   Hemoglobin 12.3  12.0 - 15.0 g/dL   HCT 37.7  36.0 - 46.0 %   MCV 84.3  78.0 - 100.0 fL   MCH 27.5  26.0 - 34.0 pg   MCHC 32.6  30.0 - 36.0 g/dL   RDW 14.8  11.5 - 15.5 %   Platelets 287  150 - 400 K/uL   Neutrophils Relative % 83 (*) 43 - 77 %   Neutro Abs 9.7 (*) 1.7 - 7.7 K/uL   Lymphocytes Relative 12  12 - 46 %   Lymphs Abs 1.3  0.7 - 4.0 K/uL   Monocytes Relative 5  3 - 12 %   Monocytes Absolute 0.6  0.1 - 1.0 K/uL   Eosinophils Relative 0  0 - 5 %   Eosinophils Absolute 0.0  0.0 - 0.7 K/uL   Basophils Relative 0  0 - 1 %   Basophils Absolute 0.0  0.0 - 0.1 K/uL  COMPREHENSIVE METABOLIC PANEL     Status: Abnormal   Collection Time    01/03/14  2:46 PM      Result Value Ref Range   Sodium 138  137 - 147 mEq/L   Potassium 3.9  3.7 - 5.3 mEq/L   Chloride 99  96 - 112 mEq/L   CO2 24  19 - 32 mEq/L   Glucose, Bld 124 (*) 70 - 99 mg/dL   BUN 16  6 - 23 mg/dL   Creatinine, Ser 0.73  0.50 - 1.10 mg/dL   Calcium 10.0  8.4 - 10.5 mg/dL   Total Protein 7.4  6.0 - 8.3 g/dL   Albumin 3.8  3.5 - 5.2 g/dL   AST 19  0 - 37 U/L   ALT  14  0 - 35 U/L   Alkaline Phosphatase 52  39 - 117 U/L   Total Bilirubin 0.5  0.3 - 1.2 mg/dL   GFR calc non Af Amer 77 (*) >90 mL/min   GFR calc Af Amer 90 (*) >90 mL/min   Comment: (NOTE)     The eGFR has been calculated using the CKD EPI equation.     This calculation has not been validated in all clinical situations.     eGFR's persistently <90 mL/min signify possible Chronic Kidney     Disease.   Anion gap 15  5 - 15  I-STAT CHEM 8, ED     Status: Abnormal   Collection Time    01/03/14  2:56 PM      Result Value Ref Range   Sodium 138  137 - 147 mEq/L   Potassium 3.8  3.7 - 5.3 mEq/L   Chloride 106  96 - 112 mEq/L   BUN 15  6 - 23 mg/dL   Creatinine, Ser 0.80  0.50 - 1.10 mg/dL   Glucose, Bld 121 (*) 70 - 99 mg/dL   Calcium, Ion 1.22  1.13 - 1.30 mmol/L   TCO2 23  0 - 100 mmol/L   Hemoglobin 13.3  12.0 - 15.0 g/dL   HCT 39.0  36.0 - 46.0 %    No results found.            Blood pressure 148/67, pulse 82, temperature 98.2 F (36.8 C), temperature source Oral, resp. rate  19, SpO2 97.00%.  Physical exam:   General-- elderly white female in no acute distress Heart-- regular rate and rhythm without murmurs are gallops Lungs--lungs clear Abdomen-- soft and nontender   Assessment: 1. Acute lower G.I. bleeding. This sounds to be more consistent with diverticulosis then ulcerative colitis. Her ulcerative colitis is been in remission for some time ever since she has been on chronic prednisone. She does need colonoscopy at his continued to have severe bleeding and in view of her age and multiple other problems I think she should be admitted and observed during a colonoscopy prep transfusion  as needed. 2. Multiple chronic medical problems is noted above  Plan: patient's schedule for colonoscopy approximately 3:30 tomorrow afternoon. We will ever have to prep this evening and have to prep in the morning.  Genavieve Mangiapane JR,Gara Kincade L 01/03/2014, 4:55 PM

## 2014-01-03 NOTE — H&P (Signed)
[facility] History and Physical  GERALDIN HABERMEHL VAN:[id number] DOB: January 11, 1929 DOA: 01/03/2014  Referring physician: Margarita Mail, PA PCP: Geoffery Lyons, MD   Chief Complaint: Rectal bleeding  HPI: Sierra [name] is a 80 y.o. female with a medical history significant for hypertension, hyperlipidemia, diverticulosis of the colon (with history of diverticulitis and bowel perforation), history of ulcerative colitis, adrenal insufficiency , esophageal reflux, hypothyroidism, asthma and cardiac murmur; who came to the hospital secondary to rectal bleeding. Patient reports having blood in her stools since Sunday; With subsequent increase amount of blood on the day of admission. Patient was already scheduled to have a colonoscopy on the 27th; but given significant comorbidities, age and high risk for decompensation during bowel preparation GI has requested the patient to be admitted. Patient denies chest pain, shortness of breath, nausea, vomiting, abdominal pain, dysuria, fever/chills headaches, blurred vision or any other acute complaints.   Review of Systems:  Negative except as otherwise mentioned on history of present illness.  Past Medical History  Diagnosis Date  . Ulcerative colitis   . Hypertension   . Hyperlipemia   . Diverticulitis, colon   . Other specified iron deficiency anemias   . Thrombocytosis   . Leukocytosis   . Esophageal reflux   . Chronic pharyngitis   . Allergic rhinitis   . Acute asthmatic bronchitis   . Hypothyroidism   . Cardiac murmur     DOES NOT CAUSE ANY SYMPTOMS  . Recurrent upper respiratory infection (URI)     ON 07/10/11 PT SAW DR. CLINTON YOUNG FOR TX OF ACUTE BRONCHITIS --GIVEN  EXTRA PREDNISONE ( IN ADDITION TO THE DAILY PREDNISONE PT TAKES)  AND PT FINISHED A Z-PAK--STILL HAS A COUGH TODAY 07/15/11-NO FEVER.  . Blood transfusion   . Arthritis     HX OF RT SHOULDER BURSITIS-AND THE SHOULDER IS HURTING AT PRESENT, PAIN IN LEFT KNEE AT  PRESENT  AND PAIN IN LEFT HIP  . IBS (irritable bowel syndrome)   . Balance problem     SINCE BRAIN TUMOR REMOVED IN 2002-BENIGN TUMOR-PT HAS ADRENAL INSUFFICIENCY AND TAKES DAILY PREDNISONE  . GI bleed 01/06/2013   Past Surgical History  Procedure Laterality Date  . Partial colectomy  2008  . Total hip arthroplasty  OCT 2006    right  . Vesicovaginal fistula closure w/ tah    . Thyroidectomy  1986  . Appendectomy    . Craniotomy    . Subglottal mucocoel  2000  . Meningioma resected  20O2  . Frontalis suspension  10-09-2010    lifting eyelids AND SECOND EYE SURGERY November 19, 2010  . Tonsillectomy  1938  . Abdominal hysterectomy  1985  . Dilation and curettage of uterus  1967  . Eye surgery      20 03 -RIGHT CATARACT EXTRACTED AND LEFT WAS DONE IN 2004  . Wrist tendon lesion removed 2007    . Total hip arthroplasty  07/27/2011    Procedure: TOTAL HIP ARTHROPLASTY;  Surgeon: Gearlean Alf, MD;  Location: [facility];  Service: Orthopedics;  Laterality: Left;  . Colonoscopy N/A 01/05/2013    Procedure: COLONOSCOPY;  Surgeon: Cleotis Nipper, MD;  Location: [facility] ENDOSCOPY;  Service: Endoscopy;  Laterality: N/A;  . Esophagogastroduodenoscopy N/A 01/05/2013    Procedure: ESOPHAGOGASTRODUODENOSCOPY (EGD);  Surgeon: Cleotis Nipper, MD;  Location: [facility] ENDOSCOPY;  Service: Endoscopy;  Laterality: N/A;   Social History:  reports that she has never smoked. She has never used smokeless tobacco. She reports that  she drinks alcohol. She reports that she does not use illicit drugs.  Allergies  Allergen Reactions  . Biaxin [Clarithromycin]     Bitter taste  . Ciprofloxacin Rash  . Codeine Nausea And Vomiting  . Doxycycline Nausea Only    Very nauseated  . Phenytoin Rash  . Restasis [Cyclosporine] Other (See Comments)    Eyes burn   . Sulfonamide Derivatives Rash    Family History  Problem Relation Age of Onset  . Heart attack Father     deceased     Prior to Admission medications     Medication Sig Start Date End Date Taking? Authorizing Provider  acetaminophen (TYLENOL) 500 MG tablet Take 500 mg by mouth every 6 (six) hours as needed for mild pain.   Yes Historical Provider, MD  albuterol (PROVENTIL HFA;VENTOLIN HFA) 108 (90 BASE) MCG/ACT inhaler Inhale 2 puffs into the lungs every 6 (six) hours as needed for wheezing or shortness of breath. 10/11/13  Yes Deneise Lever, MD  albuterol (PROVENTIL) (2.5 MG/3ML) 0.083% nebulizer solution Take 3 mLs (2.5 mg total) by nebulization every 6 (six) hours as needed for wheezing or shortness of breath. DX 493.90 06/14/13  Yes Deneise Lever, MD  albuterol-ipratropium (COMBIVENT) 18-103 MCG/ACT inhaler Inhale 1 puff into the lungs once as needed for wheezing or shortness of breath.   Yes Historical Provider, MD  amLODipine (NORVASC) 2.5 MG tablet Take 2.5 mg by mouth daily after breakfast.   Yes Historical Provider, MD  Artificial Tear Solution (BION TEARS) 0.1-0.3 % SOLN Place 1 drop into both eyes 2 (two) times daily as needed (dry eyes).    Yes Historical Provider, MD  B Complex-C (B-COMPLEX WITH VITAMIN C) tablet Take 1 tablet by mouth daily.   Yes Historical Provider, MD  Biotin 5000 MCG CAPS Take 5,000 mcg by mouth daily.   Yes Historical Provider, MD  cholecalciferol (VITAMIN D) 1000 UNITS tablet Take 1,000 Units by mouth daily.   Yes Historical Provider, MD  fexofenadine (ALLEGRA) 180 MG tablet Take 180 mg by mouth daily. For Allergies 07/09/11  Yes Deneise Lever, MD  fluticasone (FLOVENT HFA) 110 MCG/ACT inhaler Inhale 2 puffs into the lungs 2 (two) times daily.   Yes Historical Provider, MD  ibuprofen (ADVIL,MOTRIN) 200 MG tablet Take 400 mg by mouth every 6 (six) hours as needed for moderate pain.   Yes Historical Provider, MD  levothyroxine (SYNTHROID, LEVOTHROID) 75 MCG tablet Take 75 mcg by mouth daily before breakfast.    Yes Historical Provider, MD  LORazepam (ATIVAN) 0.5 MG tablet Take 0.5 mg by mouth at bedtime as needed  for sleep.    Yes Historical Provider, MD  Multiple Vitamin (MULTIVITAMIN) tablet Take 1 tablet by mouth daily.   Yes Historical Provider, MD  omeprazole (PRILOSEC) 20 MG capsule Take 1 capsule (20 mg total) by mouth 2 (two) times daily before a meal. 06/27/13  Yes Donita Brooks, NP  predniSONE (DELTASONE) 5 MG tablet Take 5 mg by mouth daily with breakfast.   Yes Historical Provider, MD  vitamin B-12 (CYANOCOBALAMIN) 250 MCG tablet Take 250 mcg by mouth daily.   Yes Historical Provider, MD   Physical Exam: Filed Vitals:   01/03/14 1500 01/03/14 1554 01/03/14 1600 01/03/14 1630  BP: 112/66 150/70 130/58 148/67  Pulse: 73 75 84 82  Temp:      TempSrc:      Resp: 14 17 21 19   SpO2: 94% 97% 94% 97%    Wt Readings  from Last 3 Encounters:  11/06/13 58.06 kg (128 lb)  10/11/13 58.695 kg (129 lb 6.4 oz)  06/30/13 56.79 kg (125 lb 3.2 oz)    General:  Appears calm and comfortable, she is afebrile. Patient is oriented x3 and cooperative to examination and interview. Eyes: PERRL, normal lids, irises & conjunctiva, no icterus ENT: grossly normal hearing, lips & tongue; moist mucous membranes, no thrush, no erythema or exudates inside her mouth. Neck: no LAD, masses or thyromegaly; no JVD Cardiovascular: RRR, positive systolic ejection murmur, no rubs, no gallops. No LE edema. Respiratory: CTA bilaterally, no w/r/r. Normal respiratory effort. Abdomen: soft, nt, nd; positive bowel sounds Skin: no rash, petechiae, open wounds or induration seen on exam Musculoskeletal: grossly normal tone BUE/BLE; full range of motion and no joint swelling appreciated Psychiatric: grossly normal mood and affect, speech fluent and appropriate Neurologic: grossly non-focal.          Labs on Admission:  Basic Metabolic Panel:  Recent Labs Lab 12/31/13 0907 01/03/14 1446 01/03/14 1456  NA 142 138 138  K 3.6* 3.9 3.8  CL 102 99 106  CO2 27 24  --   GLUCOSE 94 124* 121*  BUN 16 16 15   CREATININE 0.65  0.73 0.80  CALCIUM 10.1 10.0  --    Liver Function Tests:  Recent Labs Lab 12/31/13 0907 01/03/14 1446  AST 15 19  ALT 15 14  ALKPHOS 54 52  BILITOT 0.3 0.5  PROT 7.8 7.4  ALBUMIN 3.8 3.8    Recent Labs Lab 12/31/13 0907  LIPASE 27   CBC:  Recent Labs Lab 12/31/13 0907 01/03/14 1446 01/03/14 1456  WBC 9.7 11.6*  --   NEUTROABS 6.3 9.7*  --   HGB 13.4 12.3 13.3  HCT 40.7 37.7 39.0  MCV 84.3 84.3  --   PLT 301 287  --    BNP (last 3 results)  Recent Labs  06/23/13 1840  PROBNP 125.3    Radiological Exams on Admission: No results found.  EKG: I None  Assessment/Plan 1-Rectal bleeding: Most likely secondary to diverticulosis. Other considerations includes AVM, ulcerative colitis and internal hemorrhoids. -Patient will be admitted to [place] bed (as she is hemodynamically stable) -Will place 2 large IV lines -type and screen -follow Hgb trend -GI has been consulted and plan is for colonoscopy on 01/04/14; they will dictate bowel prep and appropriate time for NPO status. Will follow rec's  2-HYPERLIPIDEMIA: no actively taking any statins at home. -Further followup by PCP as an outpatient.  3-HYPERTENSION: Stable and well controlled. Will continue the use of Norvasc. -Patient started on heart healthy diet.  4-Asthma with bronchitis: A stable and well compensated. No signs/symptoms of exacerbation at this moment. -No wheezing appreciated on exam. -Continue as needed albuterol nebulizer treatments.  5-HYPOTHYROIDISM: We'll check TSH. Continue Synthroid  6-history of Diverticulosis/diverticulitis with bowel perforation: Patient had a history of subclinical bowel perforation following rupture of diverticulitis. -Will check abdominal x-ray -Currently not complaining of any abdominal pain.  7-Adrenal insufficiency: Continue chronic prednisone  8-Ulcerative colitis, chronic: Appears to be in remission. GI is on before will follow recommendations. -Plan is  for colonoscopy on 01/04/2014  9-GERD: Continue PPI twice a day.  10-leukocytosis unspecified: Most likely secondary to demargination -We'll provide gentle fluid resuscitation and follow WBCs trend   Gastroenterology (Dr. Oletta Lamas)  Code Status: Full DVT Prophylaxis:SCD's Family Communication: no family at bedside Disposition Plan: Length of stay < 2 midnights, MedSurg bed; observation status.   Time spent:  50 minutes  Barton Dubois [facility] Pager [phone]  **Disclaimer: This note may have been dictated with voice recognition software. Similar sounding words can inadvertently be transcribed and this note may contain transcription errors which may not have been corrected upon publication of note.**

## 2014-01-03 NOTE — ED Notes (Signed)
Pt A+Ox4, presents with c/o rectal bleeding x4 days, reports being seen in ER on Sunday and by PCP for blood rechecks, but reports bleeding today is worsening.  Pt reports chronic dizziness 2/2 brain tumor x13 years ago, denies new complaints.  Pt denies CP/SOB, n/v, abd pain.  Skin PWD.  Speaking full/clear sentences, rr even/un-lab.  NAD.

## 2014-01-03 NOTE — ED Provider Notes (Signed)
CSN: [id number]     Arrival date & time 01/03/14  1239 History   First MD Initiated Contact with Patient 01/03/14 1306     Chief Complaint  Patient presents with  . Rectal Bleeding    HPI Sierra [name] is a 80 y.o. female with past past medical history of ulcerative colitis, hyperlipidemia, hypertension, diverticulitis, IBS who who was found to have blood in the stool on Sunday and evalutated in the ED. She  was seen by Dr. James Edwards, GI who recommended a colonoscopy. She started her bowel prep today and had fecal incontinence and passed large red clots. She had the exact same thing happened to her one year ago before getting a colonoscopy. She denies rectal pain abdominal pain nausea vomiting or diarrhea no black stool today. She did have some black stool after taking Pepto-Bismol couple of days ago. Patient reports chronic dizziness after a brain tumor x13 years but she is at her baseline today. She also endorses generalized weakness and lightheadedness. Yesterday her hemoglobin was 13 and today it is 12.4 her vitals are otherwise stable. Patient denies chest pain, shortness of breath, fever or chills, no urinary complaints.   Past Medical History  Diagnosis Date  . Ulcerative colitis   . Hypertension   . Hyperlipemia   . Diverticulitis, colon   . Other specified iron deficiency anemias   . Thrombocytosis   . Leukocytosis   . Esophageal reflux   . Chronic pharyngitis   . Allergic rhinitis   . Acute asthmatic bronchitis   . Hypothyroidism   . Cardiac murmur     DOES NOT CAUSE ANY SYMPTOMS  . Recurrent upper respiratory infection (URI)     ON 07/10/11 PT SAW DR. CLINTON YOUNG FOR TX OF ACUTE BRONCHITIS --GIVEN  EXTRA PREDNISONE ( IN ADDITION TO THE DAILY PREDNISONE PT TAKES)  AND PT FINISHED A Z-PAK--STILL HAS A COUGH TODAY 07/15/11-NO FEVER.  . Blood transfusion   . Arthritis     HX OF RT SHOULDER BURSITIS-AND THE SHOULDER IS HURTING AT PRESENT, PAIN IN LEFT KNEE AT PRESENT  AND PAIN  IN LEFT HIP  . IBS (irritable bowel syndrome)   . Balance problem     SINCE BRAIN TUMOR REMOVED IN 2002-BENIGN TUMOR-PT HAS ADRENAL INSUFFICIENCY AND TAKES DAILY PREDNISONE  . GI bleed 01/06/2013   Past Surgical History  Procedure Laterality Date  . Partial colectomy  2008  . Total hip arthroplasty  OCT 2006    right  . Vesicovaginal fistula closure w/ tah    . Thyroidectomy  1986  . Appendectomy    . Craniotomy    . Subglottal mucocoel  2000  . Meningioma resected  20O2  . Frontalis suspension  10-09-2010    lifting eyelids AND SECOND EYE SURGERY November 19, 2010  . Tonsillectomy  1938  . Abdominal hysterectomy  1985  . Dilation and curettage of uterus  1967  . Eye surgery      20 03 -RIGHT CATARACT EXTRACTED AND LEFT WAS DONE IN 2004  . Wrist tendon lesion removed 2007    . Total hip arthroplasty  07/27/2011    Procedure: TOTAL HIP ARTHROPLASTY;  Surgeon: Gearlean Alf, MD;  Location: [facility];  Service: Orthopedics;  Laterality: Left;  . Colonoscopy N/A 01/05/2013    Procedure: COLONOSCOPY;  Surgeon: Cleotis Nipper, MD;  Location: [facility] ENDOSCOPY;  Service: Endoscopy;  Laterality: N/A;  . Esophagogastroduodenoscopy N/A 01/05/2013    Procedure: ESOPHAGOGASTRODUODENOSCOPY (EGD);  Surgeon: Youlanda Mighty  Buccini, MD;  Location: [place] ENDOSCOPY;  Service: Endoscopy;  Laterality: N/A;   Family History  Problem Relation Age of Onset  . Heart attack Father     deceased   History  Substance Use Topics  . Smoking status: Never Smoker   . Smokeless tobacco: Never Used  . Alcohol Use: Yes     Comment: occ glass of wine   OB History   Grav Para Term Preterm Abortions TAB SAB Ect Mult Living                 Review of Systems  Constitutional: Negative for fever and chills.  HENT: Negative for congestion and rhinorrhea.   Eyes: Negative for visual disturbance.  Respiratory: Negative for cough and shortness of breath.   Cardiovascular: Negative for chest pain and palpitations.   Gastrointestinal: Negative for nausea, vomiting and diarrhea.  Genitourinary: Negative for dysuria and hematuria.  Musculoskeletal: Negative for back pain and gait problem.  Skin: Negative for rash.  Neurological: Negative for weakness and headaches.      Allergies  Biaxin; Ciprofloxacin; Codeine; Doxycycline; Phenytoin; Restasis; and Sulfonamide derivatives  Home Medications   Prior to Admission medications   Medication Sig Start Date End Date Taking? Authorizing Provider  acetaminophen (TYLENOL) 500 MG tablet Take 500 mg by mouth every 6 (six) hours as needed for mild pain.    Historical Provider, MD  albuterol (PROVENTIL HFA;VENTOLIN HFA) 108 (90 BASE) MCG/ACT inhaler Inhale 2 puffs into the lungs every 6 (six) hours as needed for wheezing or shortness of breath. 10/11/13   Deneise Lever, MD  albuterol (PROVENTIL) (2.5 MG/3ML) 0.083% nebulizer solution Take 3 mLs (2.5 mg total) by nebulization every 6 (six) hours as needed for wheezing or shortness of breath. DX 493.90 06/14/13   Deneise Lever, MD  amLODipine (NORVASC) 2.5 MG tablet Take 2.5 mg by mouth daily after breakfast.    Historical Provider, MD  Artificial Tear Solution (BION TEARS) 0.1-0.3 % SOLN Place 1 drop into both eyes 2 (two) times daily as needed (dry eyes).     Historical Provider, MD  B Complex-C (B-COMPLEX WITH VITAMIN C) tablet Take 1 tablet by mouth daily.    Historical Provider, MD  Biotin 5000 MCG CAPS Take 5,000 mcg by mouth daily.    Historical Provider, MD  cholecalciferol (VITAMIN D) 1000 UNITS tablet Take 1,000 Units by mouth daily.    Historical Provider, MD  fexofenadine (ALLEGRA) 180 MG tablet Take 180 mg by mouth daily. For Allergies 07/09/11   Deneise Lever, MD  fluticasone (FLOVENT HFA) 110 MCG/ACT inhaler Inhale 2 puffs into the lungs 2 (two) times daily.    Historical Provider, MD  ibuprofen (ADVIL,MOTRIN) 200 MG tablet Take 400 mg by mouth every 6 (six) hours as needed for moderate pain.     Historical Provider, MD  levothyroxine (SYNTHROID, LEVOTHROID) 75 MCG tablet Take 75 mcg by mouth daily before breakfast.     Historical Provider, MD  LORazepam (ATIVAN) 0.5 MG tablet Take 0.5 mg by mouth at bedtime as needed for sleep.     Historical Provider, MD  Multiple Vitamin (MULTIVITAMIN) tablet Take 1 tablet by mouth daily.    Historical Provider, MD  omeprazole (PRILOSEC) 20 MG capsule Take 1 capsule (20 mg total) by mouth 2 (two) times daily before a meal. 06/27/13   Donita Brooks, NP   BP 136/68  Pulse 95  Temp(Src) 98.2 F (36.8 C) (Oral)  Resp 20  SpO2 96% Physical  Exam  Nursing note and vitals reviewed. Constitutional: She appears well-developed and well-nourished. No distress.  HENT:  Head: Normocephalic and atraumatic.  Eyes: Conjunctivae and EOM are normal. Pupils are equal, round, and reactive to light. Right eye exhibits no discharge. Left eye exhibits no discharge. No scleral icterus.  Cardiovascular: Normal rate, regular rhythm, normal heart sounds and intact distal pulses.   Pulmonary/Chest: Effort normal and breath sounds normal. No respiratory distress. She has no wheezes.  Abdominal: Soft. Bowel sounds are normal. She exhibits no distension. There is no tenderness.  Genitourinary:  Normal external rectum.  Musculoskeletal: Normal range of motion. She exhibits no tenderness.  Neurological: She is alert. She exhibits normal muscle tone. Coordination normal.  Strength intact in upper and lower extremities. Sensation intact.   Skin: Skin is warm and dry. She is not diaphoretic.  Psychiatric: She has a normal mood and affect. Her behavior is normal.  Patient speaking in clear full sentences and responses are appropriate.     ED Course  Procedures (including critical care time) Labs Review Labs Reviewed  CBC WITH DIFFERENTIAL  COMPREHENSIVE METABOLIC PANEL    Imaging Review No results found.   EKG Interpretation None     Meds given in  ED:  Medications  sodium chloride 0.9 % bolus 1,000 mL (1,000 mLs Intravenous New Bag/Given 01/03/14 1446)  polyethylene glycol-electrolytes (NuLYTELY/GoLYTELY) solution 4,000 mL (4,000 mLs Oral Given 01/03/14 1709)    New Prescriptions   No medications on file      MDM   Final diagnoses:  None  final diagnoses: Rectal Bleeding  Patient is an 80 year old female with past medical history of ulcerative colitis, irritable bowel, diverticulitis presents with bright red clots per rectum after starting her bowel prep today. Rectal bleeding started on Sunday but is worse today. Patient is afebrile and with stable vital signs. Her hemoglobin is 12.3 today it was 13 yesterday. She was given one bolus of IV fluids because her blood pressure was decreasing but it has stabilized at 148/67.  15:18 Spoke with Dr. Laurence Spates, GI physician who recommends admission to hospitalist service and continuation of bowel prep. Continue with scheduled colonoscopy tomorrow.   Hospitalist service consulted and agree to admit.  Discussed all results and patient verbalizes understanding and agrees with plan.     Pura Spice, PA-C 01/03/14 [phone]

## 2014-01-03 NOTE — ED Notes (Signed)
hospitalist at bedside

## 2014-01-03 NOTE — ED Notes (Signed)
Bed: PH43 Expected date:  Expected time:  Means of arrival:  Comments: triage

## 2014-01-03 NOTE — ED Notes (Signed)
Dr. Oletta Lamas GI at bedside

## 2014-01-03 NOTE — ED Notes (Signed)
Pt assisted to use bedpan for BM, produced large amt dark brown liquid.  Pt reports started drinking this AM for colonoscopy tomorrow.  Cleaned, changed and repositioned.  Denies further needs/complaints.

## 2014-01-04 ENCOUNTER — Ambulatory Visit (HOSPITAL_COMMUNITY): Admission: RE | Admit: 2014-01-04 | Payer: Medicare Other | Source: Ambulatory Visit | Admitting: Gastroenterology

## 2014-01-04 ENCOUNTER — Encounter (HOSPITAL_COMMUNITY): Admission: RE | Payer: Self-pay | Source: Ambulatory Visit

## 2014-01-04 ENCOUNTER — Encounter (HOSPITAL_COMMUNITY)
Admission: EM | Disposition: A | Discharge status: Home / Self Care | Payer: Self-pay | Source: Home / Self Care | Attending: Emergency Medicine

## 2014-01-04 ENCOUNTER — Encounter (HOSPITAL_COMMUNITY): Payer: Self-pay | Admitting: Gastroenterology

## 2014-01-04 DIAGNOSIS — K625 Hemorrhage of anus and rectum: Secondary | ICD-10-CM

## 2014-01-04 HISTORY — PX: COLONOSCOPY: SHX5424

## 2014-01-04 LAB — CBC
HCT: 38.1 % (ref 36.0–46.0)
HCT: 33.3 % — ABNORMAL LOW (ref 36.0–46.0)
HEMOGLOBIN: 11 g/dL — AB (ref 12.0–15.0)
Hemoglobin: 12.8 g/dL (ref 12.0–15.0)
MCH: 28 pg (ref 26.0–34.0)
MCH: 27.5 pg (ref 26.0–34.0)
MCHC: 33.6 g/dL (ref 30.0–36.0)
MCHC: 33 g/dL (ref 30.0–36.0)
MCV: 83.4 fL (ref 78.0–100.0)
MCV: 83.3 fL (ref 78.0–100.0)
PLATELETS: 323 10*3/uL (ref 150–400)
Platelets: 276 10*3/uL (ref 150–400)
RBC: 4.57 MIL/uL (ref 3.87–5.11)
RBC: 4 MIL/uL (ref 3.87–5.11)
RDW: 15.1 % (ref 11.5–15.5)
RDW: 14.9 % (ref 11.5–15.5)
WBC: 8.6 10*3/uL (ref 4.0–10.5)
WBC: 9.6 10*3/uL (ref 4.0–10.5)

## 2014-01-04 LAB — HM COLONOSCOPY

## 2014-01-04 SURGERY — COLONOSCOPY
Anesthesia: Moderate Sedation

## 2014-01-04 MED ORDER — MIDAZOLAM HCL 10 MG/2ML IJ SOLN
INTRAMUSCULAR | Status: AC
  Filled 2014-01-04: qty 4

## 2014-01-04 MED ORDER — FENTANYL CITRATE 0.05 MG/ML IJ SOLN
INTRAMUSCULAR | Status: AC
  Filled 2014-01-04: qty 2

## 2014-01-04 MED ORDER — MIDAZOLAM HCL 5 MG/5ML IJ SOLN
INTRAMUSCULAR | Status: DC | PRN
  Administered 2014-01-04: 2 mg via INTRAVENOUS
  Administered 2014-01-04: 1 mg via INTRAVENOUS
  Administered 2014-01-04: 2 mg via INTRAVENOUS

## 2014-01-04 MED ORDER — SODIUM CHLORIDE 0.9 % IV SOLN: INTRAVENOUS | Status: DC

## 2014-01-04 MED ORDER — FENTANYL CITRATE 0.05 MG/ML IJ SOLN
INTRAMUSCULAR | Status: DC | PRN
  Administered 2014-01-04: 25 ug via INTRAVENOUS
  Administered 2014-01-04: 12.5 ug via INTRAVENOUS
  Administered 2014-01-04: 25 ug via INTRAVENOUS

## 2014-01-04 MED ORDER — POLYETHYLENE GLYCOL 3350 17 G PO PACK
17.0000 g | PACK | Freq: Every day | ORAL | Status: DC
  Administered 2014-01-04 – 2014-01-05 (×2): 17 g via ORAL
  Filled 2014-01-04 (×2): qty 1

## 2014-01-04 NOTE — H&P (View-Only) (Signed)
[facility] CONSULT Reason for consult: G.I. bleeding Referring Physician: PCP: Dr. Aronson  Sierra [name] is an 80 y.o. female.  HPI: 80-year-old well-known to me. She has a significant number problems. She's had a history of ulcerative colitis and has had severe diverticular disease with previous colon resection for perforated diverticulosis. She got overall this. She has adrenal insufficiency because of removal of the benign meningio felt that the chronic prednisone that she takes has been keeping her ulcerative colitis under control. A year or so ago she was hospitalized for acute bleeding it was felt to be a diverticular bleed. She went to the emergency room a few days ago after having bright red blood per at him without preceding diarrhea. Hemoglobin was 13 and was checked subsequently in our office and was about the same. We had her setup for elective colonoscopy tomorrow that she began to have increasing bleeding and came back to the emergency room. 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  Past Medical History  Diagnosis Date  . Ulcerative colitis   . Hypertension   . Hyperlipemia   . Diverticulitis, colon   . Other specified iron deficiency anemias   . Thrombocytosis   . Leukocytosis   . Esophageal reflux   . Chronic pharyngitis   . Allergic rhinitis   . Acute asthmatic bronchitis   . Hypothyroidism   . Cardiac murmur     DOES NOT CAUSE ANY SYMPTOMS  . Recurrent upper respiratory infection (URI)     ON 07/10/11 PT SAW DR. CLINTON YOUNG FOR TX OF ACUTE BRONCHITIS --GIVEN  EXTRA PREDNISONE ( IN ADDITION TO THE DAILY PREDNISONE PT TAKES)  AND PT FINISHED A Z-PAK--STILL HAS A COUGH TODAY 07/15/11-NO FEVER.  . Blood transfusion   . Arthritis     HX OF RT SHOULDER BURSITIS-AND THE SHOULDER IS HURTING AT PRESENT, PAIN IN LEFT KNEE  AT PRESENT  AND PAIN IN LEFT HIP  . IBS (irritable bowel syndrome)   . Balance problem     SINCE BRAIN TUMOR REMOVED IN 2002-BENIGN TUMOR-PT HAS ADRENAL INSUFFICIENCY AND TAKES DAILY PREDNISONE  . GI bleed 01/06/2013    Past Surgical History  Procedure Laterality Date  . Partial colectomy  2008  . Total hip arthroplasty  OCT 2006    right  . Vesicovaginal fistula closure w/ tah    . Thyroidectomy  1986  . Appendectomy    . Craniotomy    . Subglottal mucocoel  2000  . Meningioma resected  20O2  . Frontalis suspension  10-09-2010    lifting eyelids AND SECOND EYE SURGERY November 19, 2010  . Tonsillectomy  1938  . Abdominal hysterectomy  1985  . Dilation and curettage of uterus  1967  . Eye surgery      2003 -RIGHT CATARACT EXTRACTED AND LEFT WAS DONE IN 2004  . Wrist tendon lesion removed 2007    . Total hip arthroplasty  07/27/2011    Procedure: TOTAL HIP ARTHROPLASTY;  Surgeon: Frank V Aluisio, MD;  Location: [facility];  Service: Orthopedics;  Laterality: Left;  . Colonoscopy N/A 01/05/2013    Procedure: COLONOSCOPY;  Surgeon: Robert V Buccini, MD;  Location: [facility];  Service: Endoscopy;  Laterality: N/A;  . Esophagogastroduodenoscopy N/A 01/05/2013    Procedure: ESOPHAGOGASTRODUODENOSCOPY (EGD);  Surgeon: Robert V Buccini, MD;  Location: [facility];  Service: Endoscopy;  Laterality: N/A;    Family   History  Problem Relation Age of Onset  . Heart attack Father     deceased    Social History:  reports that she has never smoked. She has never used smokeless tobacco. She reports that she drinks alcohol. She reports that she does not use illicit drugs.  Allergies:  Allergies  Allergen Reactions  . Biaxin [Clarithromycin]     Bitter taste  . Ciprofloxacin Rash  . Codeine Nausea And Vomiting  . Doxycycline Nausea Only    Very nauseated  . Phenytoin Rash  . Restasis [Cyclosporine] Other (See Comments)    Eyes burn   . Sulfonamide Derivatives Rash    Medications; Prior  to Admission medications   Medication Sig Start Date End Date Taking? Authorizing Provider  acetaminophen (TYLENOL) 500 MG tablet Take 500 mg by mouth every 6 (six) hours as needed for mild pain.   Yes Historical Provider, MD  albuterol (PROVENTIL HFA;VENTOLIN HFA) 108 (90 BASE) MCG/ACT inhaler Inhale 2 puffs into the lungs every 6 (six) hours as needed for wheezing or shortness of breath. 10/11/13  Yes Clinton D Young, MD  albuterol (PROVENTIL) (2.5 MG/3ML) 0.083% nebulizer solution Take 3 mLs (2.5 mg total) by nebulization every 6 (six) hours as needed for wheezing or shortness of breath. DX 493.90 06/14/13  Yes Clinton D Young, MD  albuterol-ipratropium (COMBIVENT) 18-103 MCG/ACT inhaler Inhale 1 puff into the lungs once as needed for wheezing or shortness of breath.   Yes Historical Provider, MD  amLODipine (NORVASC) 2.5 MG tablet Take 2.5 mg by mouth daily after breakfast.   Yes Historical Provider, MD  Artificial Tear Solution (BION TEARS) 0.1-0.3 % SOLN Place 1 drop into both eyes 2 (two) times daily as needed (dry eyes).    Yes Historical Provider, MD  B Complex-C (B-COMPLEX WITH VITAMIN C) tablet Take 1 tablet by mouth daily.   Yes Historical Provider, MD  Biotin 5000 MCG CAPS Take 5,000 mcg by mouth daily.   Yes Historical Provider, MD  cholecalciferol (VITAMIN D) 1000 UNITS tablet Take 1,000 Units by mouth daily.   Yes Historical Provider, MD  fexofenadine (ALLEGRA) 180 MG tablet Take 180 mg by mouth daily. For Allergies 07/09/11  Yes Clinton D Young, MD  fluticasone (FLOVENT HFA) 110 MCG/ACT inhaler Inhale 2 puffs into the lungs 2 (two) times daily.   Yes Historical Provider, MD  ibuprofen (ADVIL,MOTRIN) 200 MG tablet Take 400 mg by mouth every 6 (six) hours as needed for moderate pain.   Yes Historical Provider, MD  levothyroxine (SYNTHROID, LEVOTHROID) 75 MCG tablet Take 75 mcg by mouth daily before breakfast.    Yes Historical Provider, MD  LORazepam (ATIVAN) 0.5 MG tablet Take 0.5 mg by  mouth at bedtime as needed for sleep.    Yes Historical Provider, MD  Multiple Vitamin (MULTIVITAMIN) tablet Take 1 tablet by mouth daily.   Yes Historical Provider, MD  omeprazole (PRILOSEC) 20 MG capsule Take 1 capsule (20 mg total) by mouth 2 (two) times daily before a meal. 06/27/13  Yes Brandi L Ollis, NP  predniSONE (DELTASONE) 5 MG tablet Take 5 mg by mouth daily with breakfast.   Yes Historical Provider, MD  vitamin B-12 (CYANOCOBALAMIN) 250 MCG tablet Take 250 mcg by mouth daily.   Yes Historical Provider, MD   . polyethylene glycol-electrolytes  4,000 mL Oral Once   PRN Meds  Results for orders placed during the hospital encounter of 01/03/14 (from the past 48 hour(s))  CBC WITH DIFFERENTIAL     Status:   Abnormal   Collection Time    01/03/14  2:46 PM      Result Value Ref Range   WBC 11.6 (*) 4.0 - 10.5 K/uL   RBC 4.47  3.87 - 5.11 MIL/uL   Hemoglobin 12.3  12.0 - 15.0 g/dL   HCT 37.7  36.0 - 46.0 %   MCV 84.3  78.0 - 100.0 fL   MCH 27.5  26.0 - 34.0 pg   MCHC 32.6  30.0 - 36.0 g/dL   RDW 14.8  11.5 - 15.5 %   Platelets 287  150 - 400 K/uL   Neutrophils Relative % 83 (*) 43 - 77 %   Neutro Abs 9.7 (*) 1.7 - 7.7 K/uL   Lymphocytes Relative 12  12 - 46 %   Lymphs Abs 1.3  0.7 - 4.0 K/uL   Monocytes Relative 5  3 - 12 %   Monocytes Absolute 0.6  0.1 - 1.0 K/uL   Eosinophils Relative 0  0 - 5 %   Eosinophils Absolute 0.0  0.0 - 0.7 K/uL   Basophils Relative 0  0 - 1 %   Basophils Absolute 0.0  0.0 - 0.1 K/uL  COMPREHENSIVE METABOLIC PANEL     Status: Abnormal   Collection Time    01/03/14  2:46 PM      Result Value Ref Range   Sodium 138  137 - 147 mEq/L   Potassium 3.9  3.7 - 5.3 mEq/L   Chloride 99  96 - 112 mEq/L   CO2 24  19 - 32 mEq/L   Glucose, Bld 124 (*) 70 - 99 mg/dL   BUN 16  6 - 23 mg/dL   Creatinine, Ser 0.73  0.50 - 1.10 mg/dL   Calcium 10.0  8.4 - 10.5 mg/dL   Total Protein 7.4  6.0 - 8.3 g/dL   Albumin 3.8  3.5 - 5.2 g/dL   AST 19  0 - 37 U/L   ALT  14  0 - 35 U/L   Alkaline Phosphatase 52  39 - 117 U/L   Total Bilirubin 0.5  0.3 - 1.2 mg/dL   GFR calc non Af Amer 77 (*) >90 mL/min   GFR calc Af Amer 90 (*) >90 mL/min   Comment: (NOTE)     The eGFR has been calculated using the CKD EPI equation.     This calculation has not been validated in all clinical situations.     eGFR's persistently <90 mL/min signify possible Chronic Kidney     Disease.   Anion gap 15  5 - 15  I-STAT CHEM 8, ED     Status: Abnormal   Collection Time    01/03/14  2:56 PM      Result Value Ref Range   Sodium 138  137 - 147 mEq/L   Potassium 3.8  3.7 - 5.3 mEq/L   Chloride 106  96 - 112 mEq/L   BUN 15  6 - 23 mg/dL   Creatinine, Ser 0.80  0.50 - 1.10 mg/dL   Glucose, Bld 121 (*) 70 - 99 mg/dL   Calcium, Ion 1.22  1.13 - 1.30 mmol/L   TCO2 23  0 - 100 mmol/L   Hemoglobin 13.3  12.0 - 15.0 g/dL   HCT 39.0  36.0 - 46.0 %    No results found.            Blood pressure 148/67, pulse 82, temperature 98.2 F (36.8 C), temperature source Oral, resp. rate   19, SpO2 97.00%.  Physical exam:   General-- elderly white female in no acute distress Heart-- regular rate and rhythm without murmurs are gallops Lungs--lungs clear Abdomen-- soft and nontender   Assessment: 1. Acute lower G.I. bleeding. This sounds to be more consistent with diverticulosis then ulcerative colitis. Her ulcerative colitis is been in remission for some time ever since she has been on chronic prednisone. She does need colonoscopy at his continued to have severe bleeding and in view of her age and multiple other problems I think she should be admitted and observed during a colonoscopy prep transfusion  as needed. 2. Multiple chronic medical problems is noted above  Plan: patient's schedule for colonoscopy approximately 3:30 tomorrow afternoon. We will ever have to prep this evening and have to prep in the morning.  Dillie Burandt JR,Daily Doe L 01/03/2014, 4:55 PM

## 2014-01-04 NOTE — Op Note (Signed)
[facility] [place], [address]   COLONOSCOPY PROCEDURE REPORT  PATIENT: Sierra, [name]  MR#: [id number] BIRTHDATE: 1928-09-03 , 84  yrs. old GENDER: Female ENDOSCOPIST: Laurence Spates, MD REFERRED BY:   Dr. [name] PROCEDURE DATE:  01/04/2014 PROCEDURE:     Colonoscopy ASA CLASS:    class III INDICATIONS:  rectal bleeding and a woman who has diverticulosis and has had previous resection for diverticulitis. She has altered colitis that has been in remission for many years as a result of chronic steroid use from adrenal insufficiency. She said recent bright red blood per rectum without much for dropping her hemoglobin. MEDICATIONS:   62.5 mcg fentanyl, versed 5 mg  DESCRIPTION OF PROCEDURE:  The procedure had been explained to the patient and consent obtained. The [employer]  ADULT COLONOSCOPE WAS INSERTED FOLLOWING A DIGITAL RECTAL EXAM> We advanced easily to the cecum in the appendiceal orifice ileocecal valve were seen. The scope is withdrawn. There was no active bleeding whatever throughout the entire:. Diverticulosis scattered throughout left: with no bleeding. The left: did have the appearance of IBD in remission with some small pseudo polyps as well as diverticulosis. In the rectosigmoid area the anastomosis was clearly seen and was not in plain and was not bleeding. Her rectum was normal in the forward view. Internal hemorrhoids were seen in the retroflex view. The scope is withdrawn in the patient tolerated procedure well.THERE WAS NO ACTIVE BLEEDING THROUGHOUT THE WHOLE PROCEDURE IN NO COFFEE GROUND MATERIAL OR SIGNS OF RECENT BLEEDING>     COMPLICATIONS: None  ENDOSCOPIC IMPRESSION: 1. Hematochezia. Probably due to internal hemorrhoids. No active bleeding SEEN AT THE TIME OF THE PROCEDURE  @  RECOMMENDATIONS: FOLLOW PATIENT CLINICALLY AND CONTINUE TO TREAT HEMORRHOIDS  with Miralax and avoidance of constipation. If bleeding  continues she may need to see rectal surgeon.    _______________________________ Lorrin MaisLaurence Spates, MD 01/04/2014 4:27 PM  CC: Dr [name]    PATIENT NAME:  Sierra, [name] MR#: [id number]

## 2014-01-04 NOTE — Interval H&P Note (Signed)
History and Physical Interval Note:  01/04/2014 3:35 PM  Sierra [name]  has presented today for surgery, with the diagnosis of gi bleeding  The various methods of treatment have been discussed with the patient and family. After consideration of risks, benefits and other options for treatment, the patient has consented to  Procedure(s): COLONOSCOPY (N/A) as a surgical intervention .  The patient's history has been reviewed, patient examined, no change in status, stable for surgery.  I have reviewed the patient's chart and labs.  Questions were answered to the patient's satisfaction.     Cloee Dunwoody JR,Ethen Bannan L

## 2014-01-04 NOTE — Discharge Instructions (Signed)

## 2014-01-04 NOTE — Progress Notes (Signed)
Received patient from Endo, alert and oriented but a little bit drowsy, husband at bedside. Assisted by NT for dinner order. Husband at bedside.

## 2014-01-04 NOTE — Progress Notes (Signed)
Patient reports drinking 32 ounces of go-lytely at home prior to coming to ED 01/02/14.

## 2014-01-04 NOTE — Addendum Note (Signed)
Addended by: Vernie Ammons. on: 01/04/2014 08:52 AM   Modules accepted: Orders

## 2014-01-04 NOTE — Progress Notes (Signed)
UR completed 

## 2014-01-04 NOTE — Progress Notes (Signed)
PROGRESS NOTE  Sierra [name] YTK:[id number] DOB: 08/15/1928 DOA: 01/03/2014 PCP: Geoffery Lyons, MD  Subjective/ 24 H Interval events - endorsing nausea this morning due to the prep, otherwise no complaints  Assessment/Plan: Rectal bleeding - probably due to diverticulosis, appreciate GI consult, plan for colonoscopy later this afternoon  - Hb stable HYPERLIPIDEMIA: no actively taking any statins at home - Further followup by PCP as an outpatient.  HYPERTENSION: Stable and well controlled. Will continue the use of Norvasc.  Asthma with bronchitis: A stable and well compensated. No signs/symptoms of exacerbation at this moment.  HYPOTHYROIDISM: TSH normal. Continue Synthroid  History of Diverticulosis/diverticulitis with bowel perforation: Patient had a history of subclinical bowel perforation following rupture of diverticulitis.  Adrenal insufficiency: Continue chronic prednisone  Ulcerative colitis, chronic: Appears to be in remission. Colonoscopy today  GERD: Continue PPI twice a day.  Leukocytosis unspecified: Most likely secondary to demargination, resolved.  Diet: NPO Fluids: none DVT Prophylaxis: SCD  Code Status: Full Family Communication: d/w patient  Disposition Plan: home when ready   Consultants:  GI  Procedures:  None    Antibiotics  Anti-infectives   None     Antibiotics Given (last 72 hours)   None      Studies  Filed Vitals:   01/03/14 2037 01/03/14 2103 01/04/14 0610 01/04/14 0824  BP: 130/71  143/62   Pulse: 77  73   Temp: 98 F (36.7 C)  97.6 F (36.4 C)   TempSrc: Oral  Oral   Resp: 20  20   Height:  5\' 3"  (1.6 m)    Weight:  57.1 kg (125 lb 14.1 oz)    SpO2: 96%  96% 96%    Intake/Output Summary (Last 24 hours) at 01/04/14 1124 Last data filed at 01/04/14 0700  Gross per 24 hour  Intake 3216.67 ml  Output   2400 ml  Net 816.67 ml   Filed Weights   01/03/14 2103  Weight: 57.1 kg (125 lb 14.1 oz)    Exam:  General:   NAD  Cardiovascular: RRR  Respiratory: CTA biL  Abdomen: soft, non tender to palpation  MSK: no edema  Data Reviewed: Basic Metabolic Panel:  Recent Labs Lab 12/31/13 0907 01/03/14 1446 01/03/14 1456 01/03/14 1956  NA 142 138 138  --   K 3.6* 3.9 3.8  --   CL 102 99 106  --   CO2 27 24  --   --   GLUCOSE 94 124* 121*  --   BUN 16 16 15   --   CREATININE 0.65 0.73 0.80  --   CALCIUM 10.1 10.0  --   --   MG  --   --   --  1.9  PHOS  --   --   --  2.7   Liver Function Tests:  Recent Labs Lab 12/31/13 0907 01/03/14 1446  AST 15 19  ALT 15 14  ALKPHOS 54 52  BILITOT 0.3 0.5  PROT 7.8 7.4  ALBUMIN 3.8 3.8    Recent Labs Lab 12/31/13 0907  LIPASE 27   CBC:  Recent Labs Lab 12/31/13 0907 01/03/14 1446 01/03/14 1456 01/03/14 1956 01/04/14 0853  WBC 9.7 11.6*  --  10.8* 8.6  NEUTROABS 6.3 9.7*  --   --   --   HGB 13.4 12.3 13.3 12.2 12.8  HCT 40.7 37.7 39.0 36.8 38.1  MCV 84.3 84.3  --  84.0 83.4  PLT 301 287  --  287 323  BNP (last 3 results)  Recent Labs  06/23/13 1840  PROBNP 125.3   Studies: Dg Abd 2 Views  01/03/2014   CLINICAL DATA:  Rectal bleeding  EXAM: ABDOMEN - 2 VIEW  COMPARISON:  None.  FINDINGS: Nonspecific bowel gas pattern, without disproportionate small bowel dilatation to suggest small bowel obstruction.  No evidence of free air under the diaphragm on the upright view.  Degenerative changes of the visualized thoracolumbar spine.  Bilateral total hip arthroplasties.  IMPRESSION: No evidence of small bowel obstruction or free air.   Electronically Signed   By: Julian Hy M.D.   On: 01/03/2014 17:50    Scheduled Meds: . amLODipine  2.5 mg Oral Daily  . cholecalciferol  1,000 Units Oral Daily  . fluticasone  2 puff Inhalation BID  . levothyroxine  75 mcg Oral QAC breakfast  . loratadine  10 mg Oral Daily  . multivitamin with minerals  1 tablet Oral Daily  . pantoprazole  40 mg Oral BID WC  . predniSONE  5 mg Oral Q  breakfast  . vitamin B-12  250 mcg Oral Daily   Continuous Infusions: . sodium chloride 50 mL/hr at 01/03/14 1840    Principal Problem:   Rectal bleeding Active Problems:   HYPERLIPIDEMIA   HYPERTENSION   Asthma with bronchitis   HYPOTHYROIDISM   Diverticulosis   Adrenal insufficiency   Ulcerative colitis, chronic   Time spent: 25  This note has been created with Surveyor, quantity. Any transcriptional errors are unintentional.   Marzetta Board, MD [facility] Pager [phone]. If 7 PM - 7 AM, please contact night-coverage at [email or web address], password [facility] 01/04/2014, 11:24 AM  LOS: 1 day

## 2014-01-05 ENCOUNTER — Encounter (HOSPITAL_COMMUNITY): Payer: Self-pay | Admitting: Gastroenterology

## 2014-01-05 LAB — CBC
HEMATOCRIT: 35.3 % — AB (ref 36.0–46.0)
Hemoglobin: 11.6 g/dL — ABNORMAL LOW (ref 12.0–15.0)
MCH: 27.6 pg (ref 26.0–34.0)
MCHC: 32.9 g/dL (ref 30.0–36.0)
MCV: 84 fL (ref 78.0–100.0)
Platelets: 288 10*3/uL (ref 150–400)
RBC: 4.2 MIL/uL (ref 3.87–5.11)
RDW: 14.8 % (ref 11.5–15.5)
WBC: 8.1 10*3/uL (ref 4.0–10.5)

## 2014-01-05 MED ORDER — POLYETHYLENE GLYCOL 3350 17 G PO PACK: 17.0000 g | PACK | Freq: Every day | ORAL | Status: DC

## 2014-01-05 NOTE — Progress Notes (Signed)
[facility] PROGRESS NOTE Subjective No gross bleeding overnight.  Objective: Vital signs in last 24 hours: Temp:  [98 F (36.7 C)-98.3 F (36.8 C)] 98.3 F (36.8 C) (08/28 0507) Pulse Rate:  [65-91] 72 (08/28 0507) Resp:  [10-30] 18 (08/28 0507) BP: (106-170)/(48-78) 143/66 mmHg (08/28 0507) SpO2:  [95 %-100 %] 97 % (08/28 0507) Last BM Date: 01/04/14  Intake/Output from previous day: 08/27 0701 - 08/28 0700 In: 1490 [P.O.:240; I.V.:1250] Out: 500 [Urine:500] Intake/Output this shift:      Lab Results:  Recent Labs  2014-01-04 1446 01-04-2014 1456 January 04, 2014 1956 01/04/14 0853 01/04/14 2011 01/05/14 0746  WBC 11.6*  --  10.8* 8.6 9.6 8.1  HGB 12.3 13.3 12.2 12.8 11.0* 11.6*  HCT 37.7 39.0 36.8 38.1 33.3* 35.3*  PLT 287  --  287 323 276 288   BMET  Recent Labs  01-04-2014 1446 January 04, 2014 1456  NA 138 138  K 3.9 3.8  CL 99 106  CO2 24  --   CREATININE 0.73 0.80   LFT  Recent Labs  January 04, 2014 1446  PROT 7.4  AST 19  ALT 14  ALKPHOS 52  BILITOT 0.5   PT/INR No results found for this basename: LABPROT, INR,  in the last 72 hours PANCREAS No results found for this basename: LIPASE,  in the last 72 hours       Studies/Results: Dg Abd 2 Views  04-Jan-2014   CLINICAL DATA:  Rectal bleeding  EXAM: ABDOMEN - 2 VIEW  COMPARISON:  None.  FINDINGS: Nonspecific bowel gas pattern, without disproportionate small bowel dilatation to suggest small bowel obstruction.  No evidence of free air under the diaphragm on the upright view.  Degenerative changes of the visualized thoracolumbar spine.  Bilateral total hip arthroplasties.  IMPRESSION: No evidence of small bowel obstruction or free air.   Electronically Signed   By: Julian Hy M.D.   On: Jan 04, 2014 17:50    Medications: I have reviewed the patient's current medications.  Assessment/Plan: 1. LGI Bleed. ? Due to hemorrhoids or tics. Seems stable at present. Would discharge on miralax and have gone  over this with the patient would like to see her back in about 3 weeks.   Noam Karaffa JR,Arretta Toenjes L 01/05/2014, 8:07 AM

## 2014-01-05 NOTE — Discharge Summary (Signed)
Physician Discharge Summary  WRETHA LARIS PJA:[id number] DOB: 08/19/1928 DOA: 01/03/2014  PCP: Geoffery Lyons, MD  Admit date: 01/03/2014 Discharge date: 01/05/2014  Time spent: 35 minutes  Recommendations for Outpatient Follow-up:  1. Follow up with PCP in 1-2 weeks 2. Follow up with Dr. Oletta Lamas in 2 weeks   Recommendations for primary care physician for things to follow:  Repeat CBC  Discharge Diagnoses:  Principal Problem:   Rectal bleeding Active Problems:   HYPERLIPIDEMIA   HYPERTENSION   Asthma with bronchitis   HYPOTHYROIDISM   Diverticulosis   Adrenal insufficiency   Ulcerative colitis, chronic  Discharge Condition: stable  Diet recommendation: regular  Filed Weights   01/03/14 2103  Weight: 57.1 kg (125 lb 14.1 oz)   History of present illness:  Sierra [name] is a 80 y.o. female with a medical history significant for hypertension, hyperlipidemia, diverticulosis of the colon (with history of diverticulitis and bowel perforation), history of ulcerative colitis, adrenal insufficiency , esophageal reflux, hypothyroidism, asthma and cardiac murmur; who came to the hospital secondary to rectal bleeding. Patient reports having blood in her stools since Sunday; With subsequent increase amount of blood on the day of admission. Patient was already scheduled to have a colonoscopy on the 27th; but given significant comorbidities, age and high risk for decompensation during bowel preparation GI has requested the patient to be admitted. Patient denies chest pain, shortness of breath, nausea, vomiting, abdominal pain, dysuria, fever/chills headaches, blurred vision or any other acute complaints.  Hospital Course:  Patient was admitted with rectal bleeding of several days duration. Gastroenterology (Dr. Oletta Lamas) was consulted and patient underwent a colonoscopy on 8/27 without clear evidence for a source of bleeding. During her hospitalization clinically her bleeding has  resolved and her hemoglobin has remained stable on discharge. She was able to tolerate a regular diet and was discharged home in stable condition to follow up with with her PCP and GI as an outpatient. Her other medical problems as below were stable during this hospitalization and no medication changes were made.   HYPERLIPIDEMIA: no actively taking any statins at home - Further followup by PCP as an outpatient.  HYPERTENSION: Stable and well controlled. Will continue the use of Norvasc.  Asthma with bronchitis: A stable and well compensated. No signs/symptoms of exacerbation at this moment.  HYPOTHYROIDISM: TSH normal. Continue Synthroid  History of Diverticulosis/diverticulitis with bowel perforation: Patient had a history of subclinical bowel perforation following rupture of diverticulitis.  Adrenal insufficiency: Continue chronic prednisone  Ulcerative colitis, chronic: Appears to be in remission. GERD: Continue PPI twice a day.  Leukocytosis unspecified: Most likely secondary to demargination, resolved.  Procedures:  Colonoscopy 8/27 Hematochezia. Probably due to internal hemorrhoids. No active bleeding SEEN AT THE TIME OF THE PROCEDURE   Consultations:  Gastroenterology   Discharge Exam: Filed Vitals:   01/04/14 2010 01/04/14 2258 01/05/14 0507 01/05/14 0939  BP:  128/67 143/66   Pulse:  73 72   Temp:  98.3 F (36.8 C) 98.3 F (36.8 C)   TempSrc:  Oral Oral   Resp:  18 18   Height:      Weight:      SpO2: 98% 96% 97% 94%   General: NAD Cardiovascular: RRR Respiratory: CTA biL  Discharge Instructions    Medication List    STOP taking these medications       ibuprofen 200 MG tablet  Commonly known as:  ADVIL,MOTRIN      TAKE these medications  acetaminophen 500 MG tablet  Commonly known as:  TYLENOL  Take 500 mg by mouth every 6 (six) hours as needed for mild pain.     albuterol (2.5 MG/3ML) 0.083% nebulizer solution  Commonly known as:  PROVENTIL    Take 3 mLs (2.5 mg total) by nebulization every 6 (six) hours as needed for wheezing or shortness of breath. DX 493.90     albuterol 108 (90 BASE) MCG/ACT inhaler  Commonly known as:  PROVENTIL HFA;VENTOLIN HFA  Inhale 2 puffs into the lungs every 6 (six) hours as needed for wheezing or shortness of breath.     albuterol-ipratropium 18-103 MCG/ACT inhaler  Commonly known as:  COMBIVENT  Inhale 1 puff into the lungs once as needed for wheezing or shortness of breath.     amLODipine 2.5 MG tablet  Commonly known as:  NORVASC  Take 2.5 mg by mouth daily after breakfast.     B-complex with vitamin C tablet  Take 1 tablet by mouth daily.     BION TEARS 0.1-0.3 % Soln  Place 1 drop into both eyes 2 (two) times daily as needed (dry eyes).     Biotin 5000 MCG Caps  Take 5,000 mcg by mouth daily.     cholecalciferol 1000 UNITS tablet  Commonly known as:  VITAMIN D  Take 1,000 Units by mouth daily.     fexofenadine 180 MG tablet  Commonly known as:  ALLEGRA  Take 180 mg by mouth daily. For Allergies     fluticasone 110 MCG/ACT inhaler  Commonly known as:  FLOVENT HFA  Inhale 2 puffs into the lungs 2 (two) times daily.     levothyroxine 75 MCG tablet  Commonly known as:  SYNTHROID, LEVOTHROID  Take 75 mcg by mouth daily before breakfast.     LORazepam 0.5 MG tablet  Commonly known as:  ATIVAN  Take 0.5 mg by mouth at bedtime as needed for sleep.     multivitamin tablet  Take 1 tablet by mouth daily.     omeprazole 20 MG capsule  Commonly known as:  PRILOSEC  Take 1 capsule (20 mg total) by mouth 2 (two) times daily before a meal.     polyethylene glycol packet  Commonly known as:  MIRALAX / GLYCOLAX  Take 17 g by mouth daily.     predniSONE 5 MG tablet  Commonly known as:  DELTASONE  Take 5 mg by mouth daily with breakfast.     vitamin B-12 250 MCG tablet  Commonly known as:  CYANOCOBALAMIN  Take 250 mcg by mouth daily.           Follow-up Information    Follow up with ARONSON,RICHARD A, MD. Schedule an appointment as soon as possible for a visit in 2 weeks.   Specialty:  Internal Medicine   Contact information:   [facility] [place] [address] [phone]       Follow up with EDWARDS JR,JAMES L, MD. Schedule an appointment as soon as possible for a visit in 2 weeks.   Specialty:  Gastroenterology   Contact information:   [address] [place] [address] [phone]       The results of significant diagnostics from this hospitalization (including imaging, microbiology, ancillary and laboratory) are listed below for reference.    Significant  Diagnostic Studies: Dg Abd 2 Views  01/03/2014   CLINICAL DATA:  Rectal bleeding  EXAM: ABDOMEN - 2 VIEW  COMPARISON:  None.  FINDINGS: Nonspecific bowel gas pattern, without disproportionate small bowel dilatation to suggest small bowel obstruction.  No evidence of free air under the diaphragm on the upright view.  Degenerative changes of the visualized thoracolumbar spine.  Bilateral total hip arthroplasties.  IMPRESSION: No evidence of small bowel obstruction or free air.   Electronically Signed   By: Julian Hy M.D.   On: 01/03/2014 17:50    Microbiology: No results found for this or any previous visit (from the past 240 hour(s)).   Labs: Basic Metabolic Panel:  Recent Labs Lab 12/31/13 0907 01/03/14 1446 01/03/14 1456 01/03/14 1956  NA 142 138 138  --   K 3.6* 3.9 3.8  --   CL 102 99 106  --   CO2 27 24  --   --   GLUCOSE 94 124* 121*  --   BUN 16 16 15   --   CREATININE 0.65 0.73 0.80  --   CALCIUM 10.1 10.0  --   --   MG  --   --   --  1.9  PHOS  --   --   --  2.7   Liver Function Tests:  Recent Labs Lab 12/31/13 0907 01/03/14 1446  AST 15 19  ALT 15 14  ALKPHOS 54 52  BILITOT 0.3 0.5  PROT 7.8 7.4  ALBUMIN 3.8 3.8    Recent Labs Lab 12/31/13 0907  LIPASE 27   No  results found for this basename: AMMONIA,  in the last 168 hours CBC:  Recent Labs Lab 12/31/13 0907 01/03/14 1446 01/03/14 1456 01/03/14 1956 01/04/14 0853 01/04/14 2011 01/05/14 0746  WBC 9.7 11.6*  --  10.8* 8.6 9.6 8.1  NEUTROABS 6.3 9.7*  --   --   --   --   --   HGB 13.4 12.3 13.3 12.2 12.8 11.0* 11.6*  HCT 40.7 37.7 39.0 36.8 38.1 33.3* 35.3*  MCV 84.3 84.3  --  84.0 83.4 83.3 84.0  PLT 301 287  --  287 323 276 288   BNP: BNP (last 3 results)  Recent Labs  06/23/13 1840  PROBNP 125.3   Signed:  Glendi Mohiuddin  [facility] 01/05/2014, 3:18 PM

## 2014-01-05 NOTE — Progress Notes (Signed)
UR completed 

## 2014-01-05 NOTE — ED Provider Notes (Signed)
Medical screening examination/treatment/procedure(s) were performed by non-physician practitioner and as supervising physician I was immediately available for consultation/collaboration.   EKG Interpretation   Date/Time:  Wednesday January 03 2014 12:56:21 EDT Ventricular Rate:  88 PR Interval:  149 QRS Duration: 96 QT Interval:  364 QTC Calculation: 440 R Axis:   82 Text Interpretation:  Sinus rhythm Borderline right axis deviation Low  voltage, precordial leads ED PHYSICIAN INTERPRETATION AVAILABLE IN [facility] Confirmed by TEST, Record ([id number]) on 01/05/2014 7:19:24 AM       Leota Jacobsen, MD 01/05/14 [phone]

## 2014-02-12 ENCOUNTER — Encounter: Payer: Self-pay | Admitting: Internal Medicine

## 2014-02-12 ENCOUNTER — Ambulatory Visit (INDEPENDENT_AMBULATORY_CARE_PROVIDER_SITE_OTHER): Payer: Medicare Other | Admitting: Internal Medicine

## 2014-02-12 VITALS — BP 122/60 | HR 93 | Ht 64.0 in | Wt 128.8 lb

## 2014-02-12 DIAGNOSIS — J300 Vasomotor rhinitis: Secondary | ICD-10-CM

## 2014-02-12 NOTE — Progress Notes (Signed)
[name], female    DOB: April 26, 1929, 80 y.o.   MRN: [id number]  HPI 10/27/10- 80 yoF followed for asthma complicated by allergic rhinitis, bronchitis, GERD, hx of atypical AFB. Allergy vaccine- Dr Donneta Romberg. Last here -June 27, 2010- PFTs reviewed at that visit. Since then has done very well with no significant flares. She feels getting away from winter viruses was key.  She just had blepharoplasty.   02/18/11-  80 yoF followed for asthma complicated by allergic rhinitis, bronchitis, GERD, hx of atypical AFB. Allergy vaccine- Dr Donneta Romberg. She has gotten to the summer and early fall feeling very well. Really minimal chest tightness or nasal congestion. She has not been needing her nebulizer meds or rescue inhaler. We discussed the change in delivery system for Combivent. She will get one more of the current style to keep available. She has not been needing antihistamines or nasal spray.  04/27/11-  80 yoF followed for asthma complicated by allergic rhinitis, bronchitis, GERD, hx of atypical AFB. Allergy vaccine- Dr Donneta Romberg. Hx brain tumor/ adrenal insufficiency.  Husband here. Has had flu vaccine. Did well until 10 days to 2 weeks ago. She had overnight onset of cough but seemed to improve until last night when she again got acutely ill with cough, clear foamy sputum, decreased appetite, sore throat, muscle aches. Temperature at home today was 100. She continues prednisone 5 mg daily for adrenal insufficiency after treatment for brain tumor. Due for hip replacement in March.  05/15/11-  80 yoF followed for asthma complicated by allergic rhinitis, bronchitis, GERD, hx of atypical AFB. Allergy vaccine- Dr Donneta Romberg. Hx brain tumor/ adrenal insufficiency.  Husband is with her. She continues to wheeze with the exacerbation for which we saw her December 17. Sputum was cultured but is now foamy and white. She denies fever as she finishes amoxicillin. Sleeping on 3 pillows. Her nebulizer machine  made her too nervous. She had originally taken the Zithromax pack before amoxicillin. Doxycycline caused nausea and she had to stop. She has gone to 2 or 3 separate social functions that she felt she had to attend although she remains very tight in the chest with much cough and shortness of breath.  07/10/11-  80 yoF followed for asthma complicated by allergic rhinitis, bronchitis, GERD, hx of atypical AFB. Allergy vaccine- Dr Donneta Romberg. Hx brain tumor/ adrenal insufficiency.  Pending hip replacement March 18. Feeling mild chest and head congestion over the past 2 days with increased cough. Last night was wheezing. Has not needed her nebulizer machine and denies fever or malaise. Husband just had colon cancer resected.   Acute OV 07/21/11 -- 80 yo woman, hx asthma that has been labile and affected by allergies, GERD. 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 Presents today noting . She was just treated with Augmentin starting 3/8, finished a pred taper from 3/1 back down to chronic 5mg  qd. She is on flovent, combivent prn, nebulized albuterol. Also fexofenedine and omeprazole.  She has had bouts of severe cough, UA noise, wheezing. No fevers, chills, sputum, CP. She is having PND. No GERD sx.  PHARYNGITIS, CHRONIC Flaring following apparent URI and resistant to rx with pred taper and also abx. Exacerbating factors are PND and GERD. She is taking allegra prn, omeprazole qd. Will try to ramp up rx of both factors to see if we can calm her throat down. - Start NSW qd - continue allegra - increase omeprazole to bid temporarily - continue pred 5 and finish  the augmentin - not clear that this is her asthma, sounds like all UA symptoms, although she confuses the two and calls it all asthma. It would be tempting to give her a break from Flovent to see if it would calm her throat down, since this is the driving problem at this time. She will continue for now. Asked her to use her nebs prn and not on a schedule.  - she  will call later this week to update on her progress ROV Dr Annamaria Boots in 3 -4 weeks  08/25/11- 80 yoF followed for asthma complicated by allergic rhinitis, bronchitis, GERD, hx of atypical AFB. Allergy vaccine- Dr Donneta Romberg. Hx brain tumor/ adrenal insufficiency. PCP Dr [name]  [name],fatigue deneis any wheezing, cough  .having  dizzy spells  more frequent. Had left knee replacement March 18 under spinal anesthesia. Wheezed immediately before surgery and was given a nebulizer treatment preop. Since then has done very well and says she feels well today. Did have lightheadedness or dizziness which was evaluated with EKG and CT scan. History of meningioma 11 years ago.  10/09/2011 Acute OV  Complains of cough with clear mucus, wheezing, DOE, decreased energy x1week  Barky cough,  Exposed to paint fumes and cough started.  No fever or discolored mucus.  No hemoptysis , no edema.   11/10/11- 80 yoF followed for asthma complicated by allergic rhinitis, bronchitis, GERD, hx of atypical AFB. Hx brain tumor/ adrenal insufficiency. PCP Dr [name] Was seen by a nurse practitioner in May and treated with Augmentin and prednisone increased to 10 mg. She is now back to 5 mg of prednisone daily. Staying indoors to avoid the weather. Short of breath since left hip replacement surgery in March. Occasional sneeze. She dropped off of Dr. Rush Landmark allergy vaccine. Cough produces scant phlegm. Denies chest pain. She says recent echocardiogram and EKG were "fine". Dyspnea on exertion. She just stopped to rest making her bed.  CXR- 07/15/11-  IMPRESSION:  Mild hyperinflation. No active disease.  Original Report Authenticated By: Raelyn Number, M.D.   03/14/12- 80 yoF followed for asthma complicated by allergic rhinitis, bronchitis, GERD, hx of atypical AFB. Hx brain tumor/ adrenal insufficiency. PCP Dr [name]    husband here FOLLOWS FOR: still having alot of cough-thick foamy light green in  color; afraid to take Avelox after reading about side effects because her right shoulder is still healing.  Acute illness started as a cold. Now deep rattle comes and goes, worse over the past 3 weeks but she sleeps well. Coughs out "clear foam". She remains on prednisone 5 mg daily.  04/29/12- 37 yoF followed for asthma complicated by allergic rhinitis, bronchitis, GERD, hx of atypical AFB. Hx brain tumor/ adrenal insufficiency. PCP Dr [name]    husband here ACUTE VISIT: went to Neuro MD yesterday for dizzy spells and unable to walk straight-told  to follow up here for Increased SOB-states she can tell her O2 gets low; feels weak and dizzy; not using Combivent-feels like it burns her throat and needs Flovent refilled She is worried she may have a recurrence of her brain tumor. At her primary physician's office yesterday for prednisone was increased to 15 mg twice daily for 3 days then 10 mg twice daily for 3 days then 5 mg twice daily x3 days, then back to her 5 mg daily maintenance. She says a chest x-ray was done "no pneumonia" CXR 03/14/12 IMPRESSION:  No active disease. Mild hyperinflation again  noted.  Original Report Authenticated By: Lahoma Crocker, M.D.   05/16/12-  FOLLOWS FOR: pt states her breathing has gotten worse since last OV. reports that her breathing is good in the AM but after lunch is worsens. lots of coughing and wheezing.  Husband here "Bad December. " She describes coughing paroxysms and feels well in between. No effect of cough syrup except that it helps her sleep. Husband hears crackling sounds when she is lying down, breathing quietly. She was on a prednisone taper back to her maintenance 5 mg daily. Finished Augmentin last night. Using Combivent 4 times daily. Tussive soreness upper anterior chest wall. Scant productive white foamy sputum. No fever. Watery mucus from her nose triggered by coughing. She had 2 chest x-rays in November and one in December. One of them at her primary  physician. All described as clear CT chest 08/21/2011 from [facility] shows some mild emphysema and arterial calcifications but otherwise negative.  06/13/12- 10 yoF followed for asthma complicated by allergic rhinitis, bronchitis, GERD, hx of atypical AFB. Hx brain tumor/ adrenal insufficiency. PCP Dr [name]   FOLLOWS FOR: still has productive cough at times; wonders in allergies are starting to flare up again-sneezing. Sneezing and watery rhinorrhea which she attributes to "allergy". Dry cough is much improved compared with last visit. She finished her Biaxin 2 weeks ago. Continues prednisone 5 mg daily maintenance, plus her Flovent inhaler and uses rescue inhaler once or twice a day when needed. Sputum culture from 05/16/2012-normal flora. AFB smear negative.  08/09/12- 83 yoF followed for asthma complicated by allergic rhinitis, bronchitis, GERD, hx of atypical AFB. Hx brain tumor/ adrenal insufficiency. PCP Dr [name]  FOLLOWS [id number] any SOB, wheezing, cough, or congestion today. Would like to discuss Dymista(has been using as needed)-noticed it caused dry mouth before. Feels as comfortable with her breathing now if she ever gets. Occasional use of rescue inhaler but no significant cough or wheeze recently.  10/14/12- 3 yoF followed for asthma complicated by allergic rhinitis, bronchitis, GERD, hx of atypical AFB. Hx brain tumor/ adrenal insufficiency. PCP Dr [name]   Husband here ACUTE VISIT: Started feeling bad Wednesday last week; Thursday started wheezing, cough, SOB, fevers of 100's, not wanting to eat-nothing to eat today. She had done too much in yard work for 2 days. For the past 8 or 10 days has felt acutely ill with cough especially bad for the past 2 days. Deep cough, exhausted. Nebulizer does not help. Dr [name] also wanted to see if increasing maintenance prednisone would help her chronic dizziness.  02/09/13- 3 yoF followed for asthma complicated by allergic rhinitis,  bronchitis, GERD, hx of atypical AFB. Hx brain tumor/ adrenal insufficiency. PCP Dr [name]   Husband here FOLLOWS [id number] she has had a few flare ups since last visit(used rescue inhaler maybe 3 times) Had flu vaccine We reviewed her recent hospitalization- salivary stone surgery complicated by bleeding diverticulum. Dymista helps sneeze and rhinorhea.  06/13/13-84 yoF followed for asthma complicated by allergic rhinitis, bronchitis, GERD, hx of atypical AFB. Hx brain tumor/ adrenal insufficiency. PCP Dr [name]   Husband here FOLLOWS FOR: pt is currently having cough-productive-green; no fevers, runny nose and sneezing. Has been going on since Saturday.  06/30/13- 84 yoF followed for asthma complicated by allergic rhinitis, bronchitis, GERD, hx of atypical AFB. Hx brain tumor/ adrenal insufficiency. PCP Dr [name]   Husband here Acute hosp 2/13-2/17/15 for AE asthma/ bronchitis FOLLOWS FOR:  Breathing and cough have improved.  c/o: of stroke  affecting (L) eye and causing dizzy spells Breathing has been much better since hospital, without wheeze now. Using just Flovent without needing her nebulizer. Tapering prednisone towards 5 mg daily maintenance dose. CXR 06/23/13 IMPRESSION:  Hyperexpanded lungs and bronchitic change without acute  cardiopulmonary disease. Specifically, no definite evidence of  pneumonia.  Electronically Signed  By: Sandi Mariscal M.D.  On: 06/23/2013 19:14   ROS-see HPI Constitutional:   No-   weight loss, night sweats, +fevers, chills, fatigue, lassitude. HEENT:   No-  headaches, difficulty swallowing, tooth/dental problems, sore throat,       No-sneezing, itching, ear ache, +nasal congestion, no-post nasal drip,  CV:  No-   chest pain, orthopnea, PND, swelling in lower extremities, anasarca, dizziness, palpitations Resp: + improved shortness of breath with exertion or at rest.              No- productive cough, no- non-productive cough,  No- coughing up of blood.               No-   change in color of mucus.  No- wheezing.   Skin: No-   rash or lesions. GI:  No-   heartburn, indigestion, abdominal pain, nausea, vomiting,  GU:  MS:  No-   joint pain or swelling.  . Neuro-     +dizzy since ophthalmic artery stroke Psych:  No- change in mood or affect. No depression or anxiety.  No memory loss.  OBJ- Physical Exam General- Alert, Oriented, Affect-appropriate, Distress- none acute.  Skin- rash-none, lesions- none, excoriation- none Lymphadenopathy- none Head- atraumatic            Eyes- Gross vision intact, PERRLA, conjunctivae and secretions clear            Ears- Hearing, canals-normal            Nose- Clear, no-Septal dev, mucus, polyps, erosion, perforation             Throat- Mallampati II , mucosa clear , drainage- none, tonsils- atrophic. A little hoarse with throat clearing. Neck- flexible , trachea midline, stridor-none, thyroid nl, carotid no bruit Chest - symmetrical excursion , unlabored           Heart/CV- RRR , no murmur , no gallop  , no rub, nl s1 s2                           - JVD- none , edema- none, stasis changes- none, varices- none           Lung-  Clear, wheeze-none, cough-none , dullness-none, rub- none           Chest wall-  Abd-  Br/ Gen/ Rectal- Not done, not indicated Extrem- cyanosis- none, clubbing, none, atrophy- none, strength- nl Neuro- grossly intact to observation       [name], female    DOB: [birth year], 80 y.o.   MRN: [id number]  HPI 10/27/10- 80 yoF followed for asthma complicated by allergic rhinitis, bronchitis, GERD, hx of atypical AFB. Allergy vaccine- Dr Donneta Romberg. Last here -June 27, 2010- PFTs reviewed at that visit. Since then has done very well with no significant flares. She feels getting away from winter viruses was key.  She just had blepharoplasty.   02/18/11-  80 yoF followed for asthma complicated by allergic rhinitis, bronchitis, GERD, hx of atypical AFB. Allergy vaccine-  Dr Donneta Romberg. She has gotten to the summer and early fall feeling very  well. Really minimal chest tightness or nasal congestion. She has not been needing her nebulizer meds or rescue inhaler. We discussed the change in delivery system for Combivent. She will get one more of the current style to keep available. She has not been needing antihistamines or nasal spray.  04/27/11-  32 yoF followed for asthma complicated by allergic rhinitis, bronchitis, GERD, hx of atypical AFB. Allergy vaccine- Dr Donneta Romberg. Hx brain tumor/ adrenal insufficiency.  Husband here. Has had flu vaccine. Did well until 10 days to 2 weeks ago. She had overnight onset of cough but seemed to improve until last night when she again got acutely ill with cough, clear foamy sputum, decreased appetite, sore throat, muscle aches. Temperature at home today was 100. She continues prednisone 5 mg daily for adrenal insufficiency after treatment for brain tumor. Due for hip replacement in March.  05/15/11-  80 yoF followed for asthma complicated by allergic rhinitis, bronchitis, GERD, hx of atypical AFB. Allergy vaccine- Dr Donneta Romberg. Hx brain tumor/ adrenal insufficiency.  Husband is with her. She continues to wheeze with the exacerbation for which we saw her December 17. Sputum was cultured but is now foamy and white. She denies fever as she finishes amoxicillin. Sleeping on 3 pillows. Her nebulizer machine made her too nervous. She had originally taken the Zithromax pack before amoxicillin. Doxycycline caused nausea and she had to stop. She has gone to 2 or 3 separate social functions that she felt she had to attend although she remains very tight in the chest with much cough and shortness of breath.  07/10/11-  59 yoF followed for asthma complicated by allergic rhinitis, bronchitis, GERD, hx of atypical AFB. Allergy vaccine- Dr Donneta Romberg. Hx brain tumor/ adrenal insufficiency.  Pending hip replacement March 18. Feeling mild chest and head congestion over  the past 2 days with increased cough. Last night was wheezing. Has not needed her nebulizer machine and denies fever or malaise. Husband just had colon cancer resected.   Acute OV 07/21/11 -- 80 yo woman, hx asthma that has been labile and affected by allergies, GERD. 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 Presents today noting . She was just treated with Augmentin starting 3/8, finished a pred taper from 3/1 back down to chronic 5mg  qd. She is on flovent, combivent prn, nebulized albuterol. Also fexofenedine and omeprazole.  She has had bouts of severe cough, UA noise, wheezing. No fevers, chills, sputum, CP. She is having PND. No GERD sx.  PHARYNGITIS, CHRONIC Flaring following apparent URI and resistant to rx with pred taper and also abx. Exacerbating factors are PND and GERD. She is taking allegra prn, omeprazole qd. Will try to ramp up rx of both factors to see if we can calm her throat down. - Start NSW qd - continue allegra - increase omeprazole to bid temporarily - continue pred 5 and finish the augmentin - not clear that this is her asthma, sounds like all UA symptoms, although she confuses the two and calls it all asthma. It would be tempting to give her a break from Flovent to see if it would calm her throat down, since this is the driving problem at this time. She will continue for now. Asked her to use her nebs prn and not on a schedule.  - she will call later this week to update on her progress ROV Dr Annamaria Boots in 3 -4 weeks  08/25/11- 80 yoF followed for asthma complicated by allergic rhinitis, bronchitis, GERD, hx of atypical  AFB. Allergy vaccine- Dr Donneta Romberg. Hx brain tumor/ adrenal insufficiency. PCP Dr [name]  [name],fatigue deneis any wheezing, cough  .having  dizzy spells  more frequent. Had left knee replacement March 18 under spinal anesthesia. Wheezed immediately before surgery and was given a nebulizer treatment preop. Since then has done very  well and says she feels well today. Did have lightheadedness or dizziness which was evaluated with EKG and CT scan. History of meningioma 11 years ago.  10/09/2011 Acute OV  Complains of cough with clear mucus, wheezing, DOE, decreased energy x1week  Barky cough,  Exposed to paint fumes and cough started.  No fever or discolored mucus.  No hemoptysis , no edema.   11/10/11- 28 yoF followed for asthma complicated by allergic rhinitis, bronchitis, GERD, hx of atypical AFB. Hx brain tumor/ adrenal insufficiency. PCP Dr [name] Was seen by a nurse practitioner in May and treated with Augmentin and prednisone increased to 10 mg. She is now back to 5 mg of prednisone daily. Staying indoors to avoid the weather. Short of breath since left hip replacement surgery in March. Occasional sneeze. She dropped off of Dr. Rush Landmark allergy vaccine. Cough produces scant phlegm. Denies chest pain. She says recent echocardiogram and EKG were "fine". Dyspnea on exertion. She just stopped to rest making her bed.  CXR- 07/15/11-  IMPRESSION:  Mild hyperinflation. No active disease.  Original Report Authenticated By: Raelyn Number, M.D.   03/14/12- 80 yoF followed for asthma complicated by allergic rhinitis, bronchitis, GERD, hx of atypical AFB. Hx brain tumor/ adrenal insufficiency. PCP Dr [name]    husband here FOLLOWS FOR: still having alot of cough-thick foamy light green in color; afraid to take Avelox after reading about side effects because her right shoulder is still healing.  Acute illness started as a cold. Now deep rattle comes and goes, worse over the past 3 weeks but she sleeps well. Coughs out "clear foam". She remains on prednisone 5 mg daily.  04/29/12- 38 yoF followed for asthma complicated by allergic rhinitis, bronchitis, GERD, hx of atypical AFB. Hx brain tumor/ adrenal insufficiency. PCP Dr [name]    husband here ACUTE VISIT: went to Neuro MD yesterday for dizzy spells and unable to walk  straight-told  to follow up here for Increased SOB-states she can tell her O2 gets low; feels weak and dizzy; not using Combivent-feels like it burns her throat and needs Flovent refilled She is worried she may have a recurrence of her brain tumor. At her primary physician's office yesterday for prednisone was increased to 15 mg twice daily for 3 days then 10 mg twice daily for 3 days then 5 mg twice daily x3 days, then back to her 5 mg daily maintenance. She says a chest x-ray was done "no pneumonia" CXR 03/14/12 IMPRESSION:  No active disease. Mild hyperinflation again noted.  Original Report Authenticated By: Lahoma Crocker, M.D.   05/16/12-  FOLLOWS FOR: pt states her breathing has gotten worse since last OV. reports that her breathing is good in the AM but after lunch is worsens. lots of coughing and wheezing.  Husband here "Bad December. " She describes coughing paroxysms and feels well in between. No effect of cough syrup except that it helps her sleep. Husband hears crackling sounds when she is lying down, breathing quietly. She was on a prednisone taper back to her maintenance 5 mg daily. Finished Augmentin last night. Using Combivent 4 times daily. Tussive soreness upper anterior  chest wall. Scant productive white foamy sputum. No fever. Watery mucus from her nose triggered by coughing. She had 2 chest x-rays in November and one in December. One of them at her primary physician. All described as clear CT chest 08/21/2011 from [facility] shows some mild emphysema and arterial calcifications but otherwise negative.  06/13/12- 28 yoF followed for asthma complicated by allergic rhinitis, bronchitis, GERD, hx of atypical AFB. Hx brain tumor/ adrenal insufficiency. PCP Dr [name]   FOLLOWS FOR: still has productive cough at times; wonders in allergies are starting to flare up again-sneezing. Sneezing and watery rhinorrhea which she attributes to "allergy". Dry cough is much improved compared with last  visit. She finished her Biaxin 2 weeks ago. Continues prednisone 5 mg daily maintenance, plus her Flovent inhaler and uses rescue inhaler once or twice a day when needed. Sputum culture from 05/16/2012-normal flora. AFB smear negative.  08/09/12- 31 yoF followed for asthma complicated by allergic rhinitis, bronchitis, GERD, hx of atypical AFB. Hx brain tumor/ adrenal insufficiency. PCP Dr [name]  FOLLOWS [id number] any SOB, wheezing, cough, or congestion today. Would like to discuss Dymista(has been using as needed)-noticed it caused dry mouth before. Feels as comfortable with her breathing now if she ever gets. Occasional use of rescue inhaler but no significant cough or wheeze recently.  10/14/12- 3 yoF followed for asthma complicated by allergic rhinitis, bronchitis, GERD, hx of atypical AFB. Hx brain tumor/ adrenal insufficiency. PCP Dr [name]   Husband here ACUTE VISIT: Started feeling bad Wednesday last week; Thursday started wheezing, cough, SOB, fevers of 100's, not wanting to eat-nothing to eat today. She had done too much in yard work for 2 days. For the past 8 or 10 days has felt acutely ill with cough especially bad for the past 2 days. Deep cough, exhausted. Nebulizer does not help. Dr [name] also wanted to see if increasing maintenance prednisone would help her chronic dizziness.  02/09/13- 3 yoF followed for asthma complicated by allergic rhinitis, bronchitis, GERD, hx of atypical AFB. Hx brain tumor/ adrenal insufficiency. PCP Dr [name]   Husband here FOLLOWS JJH:ERDEYC she has had a few flare ups since last visit(used rescue inhaler maybe 3 times) Had flu vaccine We reviewed her recent hospitalization- salivary stone surgery complicated by bleeding diverticulum. Dymista helps sneeze and rhinorhea.  06/13/13-84 yoF followed for asthma complicated by allergic rhinitis, bronchitis, GERD, hx of atypical AFB. Hx brain tumor/ adrenal insufficiency. PCP Dr [name]   Husband here FOLLOWS  FOR: pt is currently having cough-productive-green; no fevers, runny nose and sneezing. Has been going on since Saturday.  06/30/13- 84 yoF followed for asthma complicated by allergic rhinitis, bronchitis, GERD, hx of atypical AFB. Hx brain tumor/ adrenal insufficiency. PCP Dr [name]   Husband here Acute hosp 2/13-2/17/15 for AE asthma/ bronchitis FOLLOWS FOR:  Breathing and cough have improved.  c/o: of stroke affecting (L) eye and causing dizzy spells Breathing has been much better since hospital, without wheeze now. Using just Flovent without needing her nebulizer. Tapering prednisone towards 5 mg daily maintenance dose. CXR 06/23/13 IMPRESSION:  Hyperexpanded lungs and bronchitic change without acute  cardiopulmonary disease. Specifically, no definite evidence of  pneumonia.  Electronically Signed  By: Sandi Mariscal M.D.  On: 06/23/2013 19:14  10/11/13- 63 yoF followed for asthma complicated by allergic rhinitis, bronchitis, GERD, hx of atypical AFB. Complicated by Hx brain tumor/ adrenal insufficiency, CVA. PCP Dr [name]   Husband here FOLLOWS FOR:Pt states she has been doing well since seen  last; had to use her rescue inhaler a few times since 06-2013 visit. Rare need for rescue inhaler but needs refill. Doing a lot of yard work. Continues prednisone maintenance 5 mg daily. Had CVA which left her dizzy and with decreased eyesight.  02/12/14-  24 yoF followed for asthma complicated by allergic rhinitis, bronchitis, GERD, hx of atypical AFB. Complicated by Hx brain tumor/ adrenal insufficiency, CVA. PCP Dr [name]   Husband here FOLLOW FOR: Constantly dizzy, cough in the mornings    ROS-see HPI Constitutional:   No-   weight loss, night sweats, +fevers, chills, fatigue, lassitude. HEENT:   No-  headaches, difficulty swallowing, tooth/dental problems, sore throat,       No-sneezing, itching, ear ache, +nasal congestion, no-post nasal drip,  CV:  No-   chest pain, orthopnea, PND, swelling in  lower extremities, anasarca, dizziness, palpitations Resp: +shortness of breath with exertion or at rest.              No- productive cough, no- non-productive cough,  No- coughing up of blood.              No-   change in color of mucus.  No- wheezing.   Skin: No-   rash or lesions. GI:  No-   heartburn, indigestion, abdominal pain, nausea, vomiting,  GU:  MS:  No-   joint pain or swelling.  . Neuro-     +dizzy since ophthalmic artery stroke Psych:  No- change in mood or affect. No depression or anxiety.  No memory loss.  OBJ- Physical Exam General- Alert, Oriented, Affect-appropriate, Distress- none acute.  Skin- rash-none, lesions- none, excoriation- none Lymphadenopathy- none Head- atraumatic            Eyes- Gross vision intact, PERRLA, conjunctivae and secretions clear            Ears- Hearing, canals-normal            Nose- Clear, no-Septal dev, mucus, polyps, erosion, perforation             Throat- Mallampati II , mucosa clear , drainage- none, tonsils- atrophic. Neck- flexible , trachea midline, stridor-none, thyroid nl, carotid no bruit Chest - symmetrical excursion , unlabored           Heart/CV- RRR , no murmur , no gallop  , no rub, nl s1 s2                           - JVD- none , edema- none, stasis changes- none, varices- none           Lung-  Clear, wheeze-none, cough-none , dullness-none, rub- none           Chest wall-  Abd-  Br/ Gen/ Rectal- Not done, not indicated Extrem- cyanosis- none, clubbing, none, atrophy- none, strength- nl Neuro- grossly intact to observation

## 2014-02-12 NOTE — Patient Instructions (Signed)
We can continue current meds  Please call as needed 

## 2014-04-24 ENCOUNTER — Encounter: Payer: Self-pay | Admitting: Cardiology

## 2014-06-25 ENCOUNTER — Other Ambulatory Visit: Payer: Self-pay | Admitting: Gastroenterology

## 2014-06-26 ENCOUNTER — Encounter (HOSPITAL_COMMUNITY): Payer: Self-pay | Admitting: *Deleted

## 2014-06-26 ENCOUNTER — Encounter (HOSPITAL_COMMUNITY)
Admission: RE | Disposition: A | Discharge status: Home / Self Care | Payer: Self-pay | Source: Ambulatory Visit | Attending: Gastroenterology

## 2014-06-26 ENCOUNTER — Ambulatory Visit (HOSPITAL_COMMUNITY)
Admission: RE | Admit: 2014-06-26 | Discharge: 2014-06-26 | Disposition: A | Discharge status: Home / Self Care | Payer: Medicare Other | Source: Ambulatory Visit | Attending: Gastroenterology | Admitting: Gastroenterology

## 2014-06-26 ENCOUNTER — Other Ambulatory Visit: Payer: Self-pay | Admitting: Gastroenterology

## 2014-06-26 DIAGNOSIS — Z79899 Other long term (current) drug therapy: Secondary | ICD-10-CM | POA: Diagnosis not present

## 2014-06-26 DIAGNOSIS — K2980 Duodenitis without bleeding: Secondary | ICD-10-CM | POA: Insufficient documentation

## 2014-06-26 DIAGNOSIS — Z9889 Other specified postprocedural states: Secondary | ICD-10-CM | POA: Insufficient documentation

## 2014-06-26 DIAGNOSIS — Z96643 Presence of artificial hip joint, bilateral: Secondary | ICD-10-CM | POA: Diagnosis not present

## 2014-06-26 DIAGNOSIS — Z9049 Acquired absence of other specified parts of digestive tract: Secondary | ICD-10-CM | POA: Diagnosis not present

## 2014-06-26 DIAGNOSIS — K317 Polyp of stomach and duodenum: Secondary | ICD-10-CM | POA: Insufficient documentation

## 2014-06-26 DIAGNOSIS — Z8719 Personal history of other diseases of the digestive system: Secondary | ICD-10-CM | POA: Diagnosis not present

## 2014-06-26 DIAGNOSIS — I119 Hypertensive heart disease without heart failure: Secondary | ICD-10-CM | POA: Insufficient documentation

## 2014-06-26 DIAGNOSIS — K529 Noninfective gastroenteritis and colitis, unspecified: Secondary | ICD-10-CM | POA: Insufficient documentation

## 2014-06-26 DIAGNOSIS — Z8701 Personal history of pneumonia (recurrent): Secondary | ICD-10-CM | POA: Diagnosis not present

## 2014-06-26 DIAGNOSIS — K219 Gastro-esophageal reflux disease without esophagitis: Secondary | ICD-10-CM | POA: Insufficient documentation

## 2014-06-26 DIAGNOSIS — Z9089 Acquired absence of other organs: Secondary | ICD-10-CM | POA: Insufficient documentation

## 2014-06-26 DIAGNOSIS — J45901 Unspecified asthma with (acute) exacerbation: Secondary | ICD-10-CM | POA: Insufficient documentation

## 2014-06-26 DIAGNOSIS — E785 Hyperlipidemia, unspecified: Secondary | ICD-10-CM | POA: Insufficient documentation

## 2014-06-26 DIAGNOSIS — R197 Diarrhea, unspecified: Secondary | ICD-10-CM | POA: Diagnosis present

## 2014-06-26 DIAGNOSIS — E890 Postprocedural hypothyroidism: Secondary | ICD-10-CM | POA: Insufficient documentation

## 2014-06-26 DIAGNOSIS — M1610 Unilateral primary osteoarthritis, unspecified hip: Secondary | ICD-10-CM | POA: Diagnosis not present

## 2014-06-26 DIAGNOSIS — Z90710 Acquired absence of both cervix and uterus: Secondary | ICD-10-CM | POA: Diagnosis not present

## 2014-06-26 DIAGNOSIS — K5732 Diverticulitis of large intestine without perforation or abscess without bleeding: Secondary | ICD-10-CM | POA: Diagnosis not present

## 2014-06-26 DIAGNOSIS — K2940 Chronic atrophic gastritis without bleeding: Secondary | ICD-10-CM | POA: Diagnosis not present

## 2014-06-26 DIAGNOSIS — R1013 Epigastric pain: Secondary | ICD-10-CM | POA: Diagnosis present

## 2014-06-26 DIAGNOSIS — R531 Weakness: Secondary | ICD-10-CM | POA: Diagnosis present

## 2014-06-26 HISTORY — PX: FLEXIBLE SIGMOIDOSCOPY: SHX5431

## 2014-06-26 HISTORY — PX: ESOPHAGOGASTRODUODENOSCOPY: SHX5428

## 2014-06-26 LAB — CBC
HCT: 38.6 % (ref 36.0–46.0)
HEMOGLOBIN: 13 g/dL (ref 12.0–15.0)
MCH: 27.5 pg (ref 26.0–34.0)
MCHC: 33.7 g/dL (ref 30.0–36.0)
MCV: 81.8 fL (ref 78.0–100.0)
Platelets: 256 10*3/uL (ref 150–400)
RBC: 4.72 MIL/uL (ref 3.87–5.11)
RDW: 15.1 % (ref 11.5–15.5)
WBC: 8.9 10*3/uL (ref 4.0–10.5)

## 2014-06-26 LAB — BASIC METABOLIC PANEL
ANION GAP: 7 (ref 5–15)
BUN: 13 mg/dL (ref 6–23)
CO2: 26 mmol/L (ref 19–32)
Calcium: 8.8 mg/dL (ref 8.4–10.5)
Chloride: 100 mmol/L (ref 96–112)
Creatinine, Ser: 0.89 mg/dL (ref 0.50–1.10)
GFR calc Af Amer: 68 mL/min — ABNORMAL LOW (ref >90)
GFR, EST NON AFRICAN AMERICAN: 58 mL/min — AB (ref >90)
Glucose, Bld: 139 mg/dL — ABNORMAL HIGH (ref 70–99)
POTASSIUM: 3 mmol/L — AB (ref 3.5–5.1)
SODIUM: 133 mmol/L — AB (ref 135–145)

## 2014-06-26 SURGERY — EGD (ESOPHAGOGASTRODUODENOSCOPY)
Anesthesia: Moderate Sedation

## 2014-06-26 MED ORDER — DIPHENHYDRAMINE HCL 50 MG/ML IJ SOLN
INTRAMUSCULAR | Status: AC
  Filled 2014-06-26: qty 1

## 2014-06-26 MED ORDER — MIDAZOLAM HCL 10 MG/2ML IJ SOLN
INTRAMUSCULAR | Status: DC | PRN
  Administered 2014-06-26: 1 mg via INTRAVENOUS
  Administered 2014-06-26: 2 mg via INTRAVENOUS
  Administered 2014-06-26: 1 mg via INTRAVENOUS

## 2014-06-26 MED ORDER — MIDAZOLAM HCL 5 MG/ML IJ SOLN
INTRAMUSCULAR | Status: AC
  Filled 2014-06-26: qty 2

## 2014-06-26 MED ORDER — FENTANYL CITRATE 0.05 MG/ML IJ SOLN
INTRAMUSCULAR | Status: DC | PRN
  Administered 2014-06-26: 12.5 ug via INTRAVENOUS
  Administered 2014-06-26: 25 ug via INTRAVENOUS
  Administered 2014-06-26: 12.5 ug via INTRAVENOUS

## 2014-06-26 MED ORDER — SODIUM CHLORIDE 0.9 % IV SOLN
INTRAVENOUS | Status: DC
  Administered 2014-06-26: 09:00:00 via INTRAVENOUS
  Administered 2014-06-26: 500 mL via INTRAVENOUS

## 2014-06-26 MED ORDER — FENTANYL CITRATE 0.05 MG/ML IJ SOLN
INTRAMUSCULAR | Status: AC
  Filled 2014-06-26: qty 2

## 2014-06-26 MED ORDER — BUTAMBEN-TETRACAINE-BENZOCAINE 2-2-14 % EX AERO
INHALATION_SPRAY | CUTANEOUS | Status: DC | PRN
  Administered 2014-06-26: 2 via TOPICAL

## 2014-06-26 NOTE — Discharge Instructions (Signed)
Gastrointestinal Endoscopy, Care After Refer to this sheet in the next few weeks. These instructions provide you with information on caring for yourself after your procedure. Your caregiver may also give you more specific instructions. Your treatment has been planned according to current medical practices, but problems sometimes occur. Call your caregiver if you have any problems or questions after your procedure. HOME CARE INSTRUCTIONS  If you were given medicine to help you relax (sedative), do not drive, operate machinery, or sign important documents for 24 hours.  Avoid alcohol and hot or warm beverages for the first 24 hours after the procedure.  Only take over-the-counter or prescription medicines for pain, discomfort, or fever as directed by your caregiver. You may resume taking your normal medicines unless your caregiver tells you otherwise. Ask your caregiver when you may resume taking medicines that may cause bleeding, such as aspirin, clopidogrel, or warfarin.  You may return to your normal diet and activities on the day after your procedure, or as directed by your caregiver. Walking may help to reduce any bloated feeling in your abdomen.  Drink enough fluids to keep your urine clear or pale yellow.  You may gargle with salt water if you have a sore throat. SEEK IMMEDIATE MEDICAL CARE IF:  You have severe nausea or vomiting.  You have severe abdominal pain, abdominal cramps that last longer than 6 hours, or abdominal swelling (distention).  You have severe shoulder or back pain.  You have trouble swallowing.  You have shortness of breath, your breathing is shallow, or you are breathing faster than normal.  You have a fever or a rapid heartbeat.  You vomit blood or material that looks like coffee grounds.  You have bloody, black, or tarry stools. MAKE SURE YOU:  Understand these instructions.  Will watch your condition.  Will get help right away if you are not doing  well or get worse. Document Released: 12/10/2003 Document Revised: 09/11/2013 Document Reviewed: 07/28/2011 [facility] Patient Information 2015 [place], [place]. This information is not intended to replace advice given to you by your health care provider. Make sure you discuss any questions you have with your health care provider. Colonoscopy, Care After Refer to this sheet in the next few weeks. These instructions provide you with information on caring for yourself after your procedure. Your health care provider may also give you more specific instructions. Your treatment has been planned according to current medical practices, but problems sometimes occur. Call your health care provider if you have any problems or questions after your procedure. WHAT TO EXPECT AFTER THE PROCEDURE  After your procedure, it is typical to have the following:  A small amount of blood in your stool.  Moderate amounts of gas and mild abdominal cramping or bloating. HOME CARE INSTRUCTIONS  Do not drive, operate machinery, or sign important documents for 24 hours.  You may shower and resume your regular physical activities, but move at a slower pace for the first 24 hours.  Take frequent rest periods for the first 24 hours.  Walk around or put a warm pack on your abdomen to help reduce abdominal cramping and bloating.  Drink enough fluids to keep your urine clear or pale yellow.  You may resume your normal diet as instructed by your health care provider. Avoid heavy or fried foods that are hard to digest.  Avoid drinking alcohol for 24 hours or as instructed by your health care provider.  Only take over-the-counter or prescription medicines as directed by your  health care provider.  If a tissue sample (biopsy) was taken during your procedure:  Do not take aspirin or blood thinners for 7 days, or as instructed by your health care provider.  Do not drink alcohol for 7 days, or as instructed by your health care  provider.  Eat soft foods for the first 24 hours. SEEK MEDICAL CARE IF: You have persistent spotting of blood in your stool 2-3 days after the procedure. SEEK IMMEDIATE MEDICAL CARE IF:  You have more than a small spotting of blood in your stool.  You pass large blood clots in your stool.  Your abdomen is swollen (distended).  You have nausea or vomiting.  You have a fever.  You have increasing abdominal pain that is not relieved with medicine. Document Released: 12/10/2003 Document Revised: 02/15/2013 Document Reviewed: 01/02/2013 [facility] Patient Information 2015 [place], [place]. This information is not intended to replace advice given to you by your health care provider. Make sure you discuss any questions you have with your health care provider.

## 2014-06-26 NOTE — Op Note (Signed)
[facility] [place], [id number]   ENDOSCOPY PROCEDURE REPORT  PATIENT: Sierra [name], Sierra [name]  MR#: [id number] BIRTHDATE: April 03, 1929 , 85  yrs. old GENDER: female ENDOSCOPIST:Gloyd Happ Oletta Lamas, MD REFERRED BY: Burnard Bunting, M.D. PROCEDURE DATE:  06/26/2014 PROCEDURE:   EGD w/ biopsy ASA CLASS:    Class III INDICATIONS: dyspepsia and woman who previously was on PPI therapy with profound weakness and heme positive stool. MEDICATION: Fentanyl 50 mcg IV and Versed 4 mg IV TOPICAL ANESTHETIC:   Cetacaine Spray  DESCRIPTION OF PROCEDURE:   After the risks and benefits of the procedure were explained, informed consent was obtained.  The Pentax Gastroscope [id number]  endoscope was introduced through the mouth  and advanced to the second portion of the duodenum .  The instrument was slowly withdrawn as the mucosa was fully examined.      ESOPHAGUS: The mucosa of the esophagus appeared normal.  STOMACH: Diffuse gastric atrophy with multiple small polyps in the proximal stomach.  Multiple biopsies were performed using cold forceps.   the larger gastric polyps were biopsied. The gastric antrum was biopsied to evaluate for H. pylori.  DUODENUM: Moderate duodenal inflammation was found in the duodenal bulb.    Retroflexed views revealed no abnormalities.    The scope was then withdrawn from the patient and the procedure completed.  COMPLICATIONS: There were no immediate complications.  ENDOSCOPIC IMPRESSION: 1.   The mucosa of the esophagus appeared normal 2.   Diffuse gastric atrophy with multiple small polyps in the 3.   Multiple biopsies were performed 4.   Duodenal inflammation was found in the duodenal bulb RECOMMENDATIONS: Will continue patient on PPI therapy and follow back in the office in 3 weeks.   _______________________________ Lorrin MaisLaurence Spates, MD 06/26/2014 10:15 AM     cc: Burnard Bunting, MD  CPT CODES: ICD  CODES:  The ICD and CPT codes recommended by this software are interpretations from the data that the clinical staff has captured with the software.  The verification of the translation of this report to the ICD and CPT codes and modifiers is the sole responsibility of the health care institution and practicing physician where this report was generated.  [employer] will not be held responsible for the validity of the ICD and CPT codes included on this report.  AMA assumes no liability for data contained or not contained herein. CPT is a Designer, television/film set of the [employer].  PATIENT NAME:  Sierra [name], Sierra [name] MR#: [id number]

## 2014-06-26 NOTE — Addendum Note (Signed)
Addended by: Vernie Ammons. on: 06/26/2014 08:56 AM   Modules accepted: Orders

## 2014-06-26 NOTE — Op Note (Signed)
[facility] [place], [id number]   FLEXIBLE SIGMOIDOSCOPY PROCEDURE REPORT  PATIENT: Sierra [name], Sierra [name]  MR#: [id number] BIRTHDATE: 29-Jan-1929 , 66  yrs. old GENDER: female ENDOSCOPIST: Laurence Spates, MD REFERRED BY: Burnard Bunting, M.D. PROCEDURE DATE:  06/26/2014 PROCEDURE:   Sigmoidoscopy with biopsy ASA CLASS:   Class III INDICATIONS:patient had history of ulcerative colitis has been on low-dose prednisone for other reasons has been having increasing mucosy bloody stools but not gross diarrhea.Marland Kitchen MEDICATIONS: patient received fentanyl 50 gm and versed 4 mg IV for EGD and received no additional sedation for this procedure.  DESCRIPTION OF PROCEDURE:   After the risks benefits and alternatives of the procedure were thoroughly explained, informed consent was obtained.  Digital exam revealed no abnormalities of the rectum. The     endoscope was introduced through the anus  and advanced to the sigmoid colon , The exam was Without limitations. The quality of the prep was The overall prep quality was good. . The instrument was then slowly withdrawn as the mucosa was fully examined.. the upper endoscope was used for the exam.         COLON FINDINGS: The mucosa the sigmoid colon was normal with a few diverticulitis seen.  The rectum reveal mild to moderate inflammation with edema and friability.  Multiple biopsies were taken.    Retroflexed views revealed marked inflammation.    The scope was then withdrawn from the patient and the procedure terminated.  COMPLICATIONS: There were no immediate complications.  ENDOSCOPIC IMPRESSION: The mucosa the sigmoid colon was normal with a few diverticulitis seen.  The rectum reveal mild to moderate inflammation with edema and friability.  Multiple biopsies were taken this was consistent with proctitis endoscopically with the sigmoid mucosal basically normal  RECOMMENDATIONS: We will add Rowasa  rectal suppositorywill see her back in the office in 3 weeks  REPEAT EXAM:  eSigned:  Laurence Spates, MD 06/26/2014 10:23 AM   LM:RAJHHID [name], MD

## 2014-06-26 NOTE — H&P (Signed)
Subjective:   Patient is a 81 y.o. female presents with diarrhea, rectal bleeding, dyspepsia, and profound weakness. Hemoglobin yesterday in our office was 13.8. She reports that her pressure was quite low and she is holding her blood pressure medications. She has a history of ulcerative colitis.. Procedure including risks and benefits discussed in office.  Patient Active Problem List   Diagnosis Date Noted  . Rectal bleeding 01/03/2014  . Diverticulosis 01/03/2014  . Adrenal insufficiency 01/03/2014  . Ulcerative colitis, chronic 01/03/2014  . Asthma with acute exacerbation 06/23/2013  . Acute blood loss anemia 01/06/2013  . LGI bleed 01/05/2013  . Malaise and fatigue 09/21/2011  . Benign hypertensive heart disease without heart failure 09/21/2011  . Atrial premature contractions 09/21/2011  . Postop Hyponatremia 07/28/2011  . Osteoarthritis of hip 07/27/2011  . PNEUMONIA 06/02/2010  . DYSPNEA ON EXERTION 05/30/2010  . ATYPICAL MYCOBACTERIAL INFECTION 05/05/2010  . HYPOTHYROIDISM 05/01/2010  . ULCERATIVE COLITIS 03/02/2010  . HOARSENESS, CHRONIC 11/05/2009  . THRUSH 04/30/2009  . Asthma with bronchitis 04/30/2009  . HYPERLIPIDEMIA 07/11/2007  . ANEMIA, IRON DEFICIENCY, MICROCYTIC 07/11/2007  . LEUKOCYTOSIS 07/11/2007  . THROMBOCYTOSIS 07/11/2007  . HYPERTENSION 07/11/2007  . Acute bronchitis 07/11/2007  . PHARYNGITIS, CHRONIC 07/11/2007  . Nonallergic vasomotor rhinitis 07/11/2007  . Esophageal reflux 07/11/2007  . DIVERTICULITIS, COLON 07/11/2007  . ARTHRITIS 07/11/2007  . CARDIAC MURMUR 07/11/2007   Past Medical History  Diagnosis Date  . Ulcerative colitis   . Hypertension   . Hyperlipemia   . Diverticulitis, colon   . Other specified iron deficiency anemias   . Thrombocytosis   . Leukocytosis   . Esophageal reflux   . Chronic pharyngitis   . Allergic rhinitis   . Acute asthmatic bronchitis   . Hypothyroidism   . Cardiac murmur     DOES NOT CAUSE ANY  SYMPTOMS  . Recurrent upper respiratory infection (URI)     ON 07/10/11 PT SAW DR. Tarri Fuller YOUNG FOR TX OF ACUTE BRONCHITIS --GIVEN  EXTRA PREDNISONE ( IN ADDITION TO THE DAILY PREDNISONE PT TAKES)  AND PT FINISHED A Z-PAK--STILL HAS A COUGH TODAY 07/15/11-NO FEVER.  Marland Kitchen Blood transfusion   . Arthritis     HX OF RT SHOULDER BURSITIS-AND THE SHOULDER IS HURTING AT PRESENT, PAIN IN LEFT KNEE AT PRESENT  AND PAIN IN LEFT HIP  . IBS (irritable bowel syndrome)   . Balance problem     SINCE BRAIN TUMOR REMOVED IN 2002-BENIGN TUMOR-PT HAS ADRENAL INSUFFICIENCY AND TAKES DAILY PREDNISONE  . GI bleed 01/06/2013    Past Surgical History  Procedure Laterality Date  . Partial colectomy  2008  . Total hip arthroplasty  OCT 2006    right  . Vesicovaginal fistula closure w/ tah    . Thyroidectomy  1986  . Appendectomy    . Craniotomy    . Subglottal mucocoel  2000  . Meningioma resected  20O2  . Frontalis suspension  10-09-2010    lifting eyelids AND SECOND EYE SURGERY November 19, 2010  . Tonsillectomy  1938  . Abdominal hysterectomy  1985  . Dilation and curettage of uterus  1967  . Eye surgery      2003 -RIGHT CATARACT EXTRACTED AND LEFT WAS DONE IN 2004  . Wrist tendon lesion removed 2007    . Total hip arthroplasty  07/27/2011    Procedure: TOTAL HIP ARTHROPLASTY;  Surgeon: Gearlean Alf, MD;  Location: [facility] ORS;  Service: Orthopedics;  Laterality: Left;  . Colonoscopy N/A 01/05/2013  Procedure: COLONOSCOPY;  Surgeon: Cleotis Nipper, MD;  Location: [facility] ENDOSCOPY;  Service: Endoscopy;  Laterality: N/A;  . Esophagogastroduodenoscopy N/A 01/05/2013    Procedure: ESOPHAGOGASTRODUODENOSCOPY (EGD);  Surgeon: Cleotis Nipper, MD;  Location: [facility] ENDOSCOPY;  Service: Endoscopy;  Laterality: N/A;  . Colonoscopy N/A 01/04/2014    Procedure: COLONOSCOPY;  Surgeon: Winfield Cunas., MD;  Location: [facility] ENDOSCOPY;  Service: Endoscopy;  Laterality: N/A;    Prescriptions prior to admission  Medication Sig  Dispense Refill Last Dose  . acetaminophen (TYLENOL) 500 MG tablet Take 500 mg by mouth every 6 (six) hours as needed for mild pain.   Past Month at Unknown time  . albuterol (PROVENTIL HFA;VENTOLIN HFA) 108 (90 BASE) MCG/ACT inhaler Inhale 2 puffs into the lungs every 6 (six) hours as needed for wheezing or shortness of breath. 1 Inhaler prn 06/26/2014 at 0500  . amLODipine (NORVASC) 2.5 MG tablet Take 2.5 mg by mouth daily after breakfast.   Past Week at Unknown time  . Artificial Tear Solution (BION TEARS) 0.1-0.3 % SOLN Place 1 drop into both eyes 2 (two) times daily as needed (dry eyes).    06/25/2014 at Unknown time  . B Complex-C (B-COMPLEX WITH VITAMIN C) tablet Take 1 tablet by mouth daily.   06/25/2014 at Unknown time  . Biotin 5000 MCG CAPS Take 5,000 mcg by mouth daily.   06/25/2014 at Unknown time  . cholecalciferol (VITAMIN D) 1000 UNITS tablet Take 1,000 Units by mouth daily.   06/25/2014 at Unknown time  . fluticasone (FLOVENT HFA) 110 MCG/ACT inhaler Inhale 2 puffs into the lungs 2 (two) times daily.   06/25/2014 at Unknown time  . levothyroxine (SYNTHROID, LEVOTHROID) 75 MCG tablet Take 75 mcg by mouth daily before breakfast.    06/26/2014 at 0500  . LORazepam (ATIVAN) 0.5 MG tablet Take 0.5 mg by mouth at bedtime as needed for sleep.    Past Week at Unknown time  . Multiple Vitamin (MULTIVITAMIN) tablet Take 1 tablet by mouth daily.   06/25/2014 at Unknown time  . Omega-3 Fatty Acids (OMEGA 3 PO) Take 720 mg by mouth daily.   06/25/2014 at Unknown time  . omeprazole (PRILOSEC) 20 MG capsule Take 1 capsule (20 mg total) by mouth 2 (two) times daily before a meal. 60 capsule 3 06/25/2014 at Unknown time  . polyethylene glycol (MIRALAX / GLYCOLAX) packet Take 17 g by mouth daily. 30 each 2 06/25/2014 at Unknown time  . predniSONE (DELTASONE) 5 MG tablet Take 5 mg by mouth daily with breakfast.   06/26/2014 at 0500  . vitamin B-12 (CYANOCOBALAMIN) 250 MCG tablet Take 250 mcg by mouth daily.    06/25/2014 at Unknown time  . albuterol (PROVENTIL) (2.5 MG/3ML) 0.083% nebulizer solution Take 3 mLs (2.5 mg total) by nebulization every 6 (six) hours as needed for wheezing or shortness of breath. DX 493.90 360 mL 12 More than a month at Unknown time   Allergies  Allergen Reactions  . Biaxin [Clarithromycin]     Bitter taste  . Ciprofloxacin Rash  . Codeine Nausea And Vomiting  . Dilantin [Phenytoin] Rash  . Doxycycline Nausea Only    Very nauseated  . Restasis [Cyclosporine] Other (See Comments)    Eyes burn   . Sulfonamide Derivatives Rash    History  Substance Use Topics  . Smoking status: Never Smoker   . Smokeless tobacco: Never Used  . Alcohol Use: Yes     Comment: occ glass of wine  Family History  Problem Relation Age of Onset  . Heart attack Father     deceased     Objective:   Patient Vitals for the past 8 hrs:  BP Temp Temp src Pulse Resp SpO2 Height Weight  06/26/14 0805 110/65 mmHg 98.2 F (36.8 C) Oral 85 18 95 % 5' 3.5" (1.613 m) 53.978 kg (119 lb)         See MD Preop evaluation      Assessment:   1. Diarrhea, dyspepsia, rectal bleeding. We'll evaluate for flare of ulcerative colitis or ulcers in the upper G.I. Tract. 2. Profound weakness of unclear etiology 3. History of ulcerative colitis 4. Multiple medical problems as noted above  Plan:   We will proceed with EGD in sigmoidoscopy today. We will also repeat CBC and Bmet to look at her electrolytes. If no G.I. cause of her symptoms is found, will discuss further with her PCP Dr. [name]

## 2014-06-27 ENCOUNTER — Encounter (HOSPITAL_COMMUNITY): Payer: Self-pay | Admitting: Gastroenterology

## 2014-08-13 ENCOUNTER — Encounter: Payer: Self-pay | Admitting: Internal Medicine

## 2014-08-13 ENCOUNTER — Ambulatory Visit (INDEPENDENT_AMBULATORY_CARE_PROVIDER_SITE_OTHER): Payer: Medicare Other | Admitting: Internal Medicine

## 2014-08-13 VITALS — BP 122/70 | HR 92 | Ht 64.0 in | Wt 121.0 lb

## 2014-08-13 DIAGNOSIS — J4540 Moderate persistent asthma, uncomplicated: Secondary | ICD-10-CM | POA: Diagnosis not present

## 2014-08-13 NOTE — Patient Instructions (Signed)
We can continue present meds  Please call as needed 

## 2014-08-13 NOTE — Progress Notes (Signed)
Patient ID: ALAZNE QUANT, female    DOB: April 26, 1929, 81 y.o.   MRN: [id number]  HPI 10/27/10- 81 yoF followed for asthma complicated by allergic rhinitis, bronchitis, GERD, hx of atypical AFB. Allergy vaccine- Dr Donneta Romberg. Last here -June 27, 2010- PFTs reviewed at that visit. Since then has done very well with no significant flares. She feels getting away from winter viruses was key.  She just had blepharoplasty.   02/18/11-  70 yoF followed for asthma complicated by allergic rhinitis, bronchitis, GERD, hx of atypical AFB. Allergy vaccine- Dr Donneta Romberg. She has gotten to the summer and early fall feeling very well. Really minimal chest tightness or nasal congestion. She has not been needing her nebulizer meds or rescue inhaler. We discussed the change in delivery system for Combivent. She will get one more of the current style to keep available. She has not been needing antihistamines or nasal spray.  04/27/11-  79 yoF followed for asthma complicated by allergic rhinitis, bronchitis, GERD, hx of atypical AFB. Allergy vaccine- Dr Donneta Romberg. Hx brain tumor/ adrenal insufficiency.  Husband here. Has had flu vaccine. Did well until 10 days to 2 weeks ago. She had overnight onset of cough but seemed to improve until last night when she again got acutely ill with cough, clear foamy sputum, decreased appetite, sore throat, muscle aches. Temperature at home today was 100. She continues prednisone 5 mg daily for adrenal insufficiency after treatment for brain tumor. Due for hip replacement in March.  05/15/11-  69 yoF followed for asthma complicated by allergic rhinitis, bronchitis, GERD, hx of atypical AFB. Allergy vaccine- Dr Donneta Romberg. Hx brain tumor/ adrenal insufficiency.  Husband is with her. She continues to wheeze with the exacerbation for which we saw her December 17. Sputum was cultured but is now foamy and white. She denies fever as she finishes amoxicillin. Sleeping on 3 pillows. Her nebulizer machine  made her too nervous. She had originally taken the Zithromax pack before amoxicillin. Doxycycline caused nausea and she had to stop. She has gone to 2 or 3 separate social functions that she felt she had to attend although she remains very tight in the chest with much cough and shortness of breath.  07/10/11-  62 yoF followed for asthma complicated by allergic rhinitis, bronchitis, GERD, hx of atypical AFB. Allergy vaccine- Dr Donneta Romberg. Hx brain tumor/ adrenal insufficiency.  Pending hip replacement March 18. Feeling mild chest and head congestion over the past 2 days with increased cough. Last night was wheezing. Has not needed her nebulizer machine and denies fever or malaise. Husband just had colon cancer resected.   Acute OV 07/21/11 -- 81 yo woman, hx asthma that has been labile and affected by allergies, GERD. 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 Presents today noting . She was just treated with Augmentin starting 3/8, finished a pred taper from 3/1 back down to chronic 5mg  qd. She is on flovent, combivent prn, nebulized albuterol. Also fexofenedine and omeprazole.  She has had bouts of severe cough, UA noise, wheezing. No fevers, chills, sputum, CP. She is having PND. No GERD sx.  PHARYNGITIS, CHRONIC Flaring following apparent URI and resistant to rx with pred taper and also abx. Exacerbating factors are PND and GERD. She is taking allegra prn, omeprazole qd. Will try to ramp up rx of both factors to see if we can calm her throat down. - Start NSW qd - continue allegra - increase omeprazole to bid temporarily - continue pred 5 and finish  the augmentin - not clear that this is her asthma, sounds like all UA symptoms, although she confuses the two and calls it all asthma. It would be tempting to give her a break from Flovent to see if it would calm her throat down, since this is the driving problem at this time. She will continue for now. Asked her to use her nebs prn and not on a schedule.  - she  will call later this week to update on her progress ROV Dr Annamaria Boots in 3 -4 weeks  08/25/11- 81 yoF followed for asthma complicated by allergic rhinitis, bronchitis, GERD, hx of atypical AFB. Allergy vaccine- Dr Donneta Romberg. Hx brain tumor/ adrenal insufficiency. PCP Dr [name]  [name],fatigue deneis any wheezing, cough  .having  dizzy spells  more frequent. Had left knee replacement March 18 under spinal anesthesia. Wheezed immediately before surgery and was given a nebulizer treatment preop. Since then has done very well and says she feels well today. Did have lightheadedness or dizziness which was evaluated with EKG and CT scan. History of meningioma 11 years ago.  10/09/2011 Acute OV  Complains of cough with clear mucus, wheezing, DOE, decreased energy x1week  Barky cough,  Exposed to paint fumes and cough started.  No fever or discolored mucus.  No hemoptysis , no edema.   11/10/11- 22 yoF followed for asthma complicated by allergic rhinitis, bronchitis, GERD, hx of atypical AFB. Hx brain tumor/ adrenal insufficiency. PCP Dr [name] Was seen by a nurse practitioner in May and treated with Augmentin and prednisone increased to 10 mg. She is now back to 5 mg of prednisone daily. Staying indoors to avoid the weather. Short of breath since left hip replacement surgery in March. Occasional sneeze. She dropped off of Dr. Rush Landmark allergy vaccine. Cough produces scant phlegm. Denies chest pain. She says recent echocardiogram and EKG were "fine". Dyspnea on exertion. She just stopped to rest making her bed.  CXR- 07/15/11-  IMPRESSION:  Mild hyperinflation. No active disease.  Original Report Authenticated By: Raelyn Number, M.D.   03/14/12- 81 yoF followed for asthma complicated by allergic rhinitis, bronchitis, GERD, hx of atypical AFB. Hx brain tumor/ adrenal insufficiency. PCP Dr [name]    husband here FOLLOWS FOR: still having alot of cough-thick foamy light green in  color; afraid to take Avelox after reading about side effects because her right shoulder is still healing.  Acute illness started as a cold. Now deep rattle comes and goes, worse over the past 3 weeks but she sleeps well. Coughs out "clear foam". She remains on prednisone 5 mg daily.  04/29/12- 37 yoF followed for asthma complicated by allergic rhinitis, bronchitis, GERD, hx of atypical AFB. Hx brain tumor/ adrenal insufficiency. PCP Dr [name]    husband here ACUTE VISIT: went to Neuro MD yesterday for dizzy spells and unable to walk straight-told  to follow up here for Increased SOB-states she can tell her O2 gets low; feels weak and dizzy; not using Combivent-feels like it burns her throat and needs Flovent refilled She is worried she may have a recurrence of her brain tumor. At her primary physician's office yesterday for prednisone was increased to 15 mg twice daily for 3 days then 10 mg twice daily for 3 days then 5 mg twice daily x3 days, then back to her 5 mg daily maintenance. She says a chest x-ray was done "no pneumonia" CXR 03/14/12 IMPRESSION:  No active disease. Mild hyperinflation again  noted.  Original Report Authenticated By: Lahoma Crocker, M.D.   05/16/12-  FOLLOWS FOR: pt states her breathing has gotten worse since last OV. reports that her breathing is good in the AM but after lunch is worsens. lots of coughing and wheezing.  Husband here "Bad December. " She describes coughing paroxysms and feels well in between. No effect of cough syrup except that it helps her sleep. Husband hears crackling sounds when she is lying down, breathing quietly. She was on a prednisone taper back to her maintenance 5 mg daily. Finished Augmentin last night. Using Combivent 4 times daily. Tussive soreness upper anterior chest wall. Scant productive white foamy sputum. No fever. Watery mucus from her nose triggered by coughing. She had 2 chest x-rays in November and one in December. One of them at her primary  physician. All described as clear CT chest 08/21/2011 from [facility] shows some mild emphysema and arterial calcifications but otherwise negative.  06/13/12- 10 yoF followed for asthma complicated by allergic rhinitis, bronchitis, GERD, hx of atypical AFB. Hx brain tumor/ adrenal insufficiency. PCP Dr [name]   FOLLOWS FOR: still has productive cough at times; wonders in allergies are starting to flare up again-sneezing. Sneezing and watery rhinorrhea which she attributes to "allergy". Dry cough is much improved compared with last visit. She finished her Biaxin 2 weeks ago. Continues prednisone 5 mg daily maintenance, plus her Flovent inhaler and uses rescue inhaler once or twice a day when needed. Sputum culture from 05/16/2012-normal flora. AFB smear negative.  08/09/12- 83 yoF followed for asthma complicated by allergic rhinitis, bronchitis, GERD, hx of atypical AFB. Hx brain tumor/ adrenal insufficiency. PCP Dr [name]  FOLLOWS [id number] any SOB, wheezing, cough, or congestion today. Would like to discuss Dymista(has been using as needed)-noticed it caused dry mouth before. Feels as comfortable with her breathing now if she ever gets. Occasional use of rescue inhaler but no significant cough or wheeze recently.  10/14/12- 3 yoF followed for asthma complicated by allergic rhinitis, bronchitis, GERD, hx of atypical AFB. Hx brain tumor/ adrenal insufficiency. PCP Dr [name]   Husband here ACUTE VISIT: Started feeling bad Wednesday last week; Thursday started wheezing, cough, SOB, fevers of 100's, not wanting to eat-nothing to eat today. She had done too much in yard work for 2 days. For the past 8 or 10 days has felt acutely ill with cough especially bad for the past 2 days. Deep cough, exhausted. Nebulizer does not help. Dr [name] also wanted to see if increasing maintenance prednisone would help her chronic dizziness.  02/09/13- 3 yoF followed for asthma complicated by allergic rhinitis,  bronchitis, GERD, hx of atypical AFB. Hx brain tumor/ adrenal insufficiency. PCP Dr [name]   Husband here FOLLOWS [id number] she has had a few flare ups since last visit(used rescue inhaler maybe 3 times) Had flu vaccine We reviewed her recent hospitalization- salivary stone surgery complicated by bleeding diverticulum. Dymista helps sneeze and rhinorhea.  06/13/13-84 yoF followed for asthma complicated by allergic rhinitis, bronchitis, GERD, hx of atypical AFB. Hx brain tumor/ adrenal insufficiency. PCP Dr [name]   Husband here FOLLOWS FOR: pt is currently having cough-productive-green; no fevers, runny nose and sneezing. Has been going on since Saturday.  06/30/13- 84 yoF followed for asthma complicated by allergic rhinitis, bronchitis, GERD, hx of atypical AFB. Hx brain tumor/ adrenal insufficiency. PCP Dr [name]   Husband here Acute hosp 2/13-2/17/15 for AE asthma/ bronchitis FOLLOWS FOR:  Breathing and cough have improved.  c/o: of stroke  affecting (L) eye and causing dizzy spells Breathing has been much better since hospital, without wheeze now. Using just Flovent without needing her nebulizer. Tapering prednisone towards 5 mg daily maintenance dose. CXR 06/23/13 IMPRESSION:  Hyperexpanded lungs and bronchitic change without acute  cardiopulmonary disease. Specifically, no definite evidence of  pneumonia.  Electronically Signed  By: Sandi Mariscal M.D.  On: 06/23/2013 19:14   ROS-see HPI Constitutional:   No-   weight loss, night sweats, +fevers, chills, fatigue, lassitude. HEENT:   No-  headaches, difficulty swallowing, tooth/dental problems, sore throat,       No-sneezing, itching, ear ache, +nasal congestion, no-post nasal drip,  CV:  No-   chest pain, orthopnea, PND, swelling in lower extremities, anasarca, dizziness, palpitations Resp: + improved shortness of breath with exertion or at rest.              No- productive cough, no- non-productive cough,  No- coughing up of blood.               No-   change in color of mucus.  No- wheezing.   Skin: No-   rash or lesions. GI:  No-   heartburn, indigestion, abdominal pain, nausea, vomiting,  GU:  MS:  No-   joint pain or swelling.  . Neuro-     +dizzy since ophthalmic artery stroke Psych:  No- change in mood or affect. No depression or anxiety.  No memory loss.  OBJ- Physical Exam General- Alert, Oriented, Affect-appropriate, Distress- none acute.  Skin- rash-none, lesions- none, excoriation- none Lymphadenopathy- none Head- atraumatic            Eyes- Gross vision intact, PERRLA, conjunctivae and secretions clear            Ears- Hearing, canals-normal            Nose- Clear, no-Septal dev, mucus, polyps, erosion, perforation             Throat- Mallampati II , mucosa clear , drainage- none, tonsils- atrophic. A little hoarse with throat clearing. Neck- flexible , trachea midline, stridor-none, thyroid nl, carotid no bruit Chest - symmetrical excursion , unlabored           Heart/CV- RRR , no murmur , no gallop  , no rub, nl s1 s2                           - JVD- none , edema- none, stasis changes- none, varices- none           Lung-  Clear, wheeze-none, cough-none , dullness-none, rub- none           Chest wall-  Abd-  Br/ Gen/ Rectal- Not done, not indicated Extrem- cyanosis- none, clubbing, none, atrophy- none, strength- nl Neuro- grossly intact to observation       Patient ID: [name], female    DOB: [birth year], 81 y.o.   MRN: [id number]  HPI 10/27/10- 81 yoF followed for asthma complicated by allergic rhinitis, bronchitis, GERD, hx of atypical AFB. Allergy vaccine- Dr Donneta Romberg. Last here -June 27, 2010- PFTs reviewed at that visit. Since then has done very well with no significant flares. She feels getting away from winter viruses was key.  She just had blepharoplasty.   02/18/11-  69 yoF followed for asthma complicated by allergic rhinitis, bronchitis, GERD, hx of atypical AFB. Allergy vaccine-  Dr Donneta Romberg. She has gotten to the summer and early fall feeling very  well. Really minimal chest tightness or nasal congestion. She has not been needing her nebulizer meds or rescue inhaler. We discussed the change in delivery system for Combivent. She will get one more of the current style to keep available. She has not been needing antihistamines or nasal spray.  04/27/11-  32 yoF followed for asthma complicated by allergic rhinitis, bronchitis, GERD, hx of atypical AFB. Allergy vaccine- Dr Donneta Romberg. Hx brain tumor/ adrenal insufficiency.  Husband here. Has had flu vaccine. Did well until 10 days to 2 weeks ago. She had overnight onset of cough but seemed to improve until last night when she again got acutely ill with cough, clear foamy sputum, decreased appetite, sore throat, muscle aches. Temperature at home today was 100. She continues prednisone 5 mg daily for adrenal insufficiency after treatment for brain tumor. Due for hip replacement in March.  05/15/11-  62 yoF followed for asthma complicated by allergic rhinitis, bronchitis, GERD, hx of atypical AFB. Allergy vaccine- Dr Donneta Romberg. Hx brain tumor/ adrenal insufficiency.  Husband is with her. She continues to wheeze with the exacerbation for which we saw her December 17. Sputum was cultured but is now foamy and white. She denies fever as she finishes amoxicillin. Sleeping on 3 pillows. Her nebulizer machine made her too nervous. She had originally taken the Zithromax pack before amoxicillin. Doxycycline caused nausea and she had to stop. She has gone to 2 or 3 separate social functions that she felt she had to attend although she remains very tight in the chest with much cough and shortness of breath.  07/10/11-  59 yoF followed for asthma complicated by allergic rhinitis, bronchitis, GERD, hx of atypical AFB. Allergy vaccine- Dr Donneta Romberg. Hx brain tumor/ adrenal insufficiency.  Pending hip replacement March 18. Feeling mild chest and head congestion over  the past 2 days with increased cough. Last night was wheezing. Has not needed her nebulizer machine and denies fever or malaise. Husband just had colon cancer resected.   Acute OV 07/21/11 -- 81 yo woman, hx asthma that has been labile and affected by allergies, GERD. 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 Presents today noting . She was just treated with Augmentin starting 3/8, finished a pred taper from 3/1 back down to chronic 5mg  qd. She is on flovent, combivent prn, nebulized albuterol. Also fexofenedine and omeprazole.  She has had bouts of severe cough, UA noise, wheezing. No fevers, chills, sputum, CP. She is having PND. No GERD sx.  PHARYNGITIS, CHRONIC Flaring following apparent URI and resistant to rx with pred taper and also abx. Exacerbating factors are PND and GERD. She is taking allegra prn, omeprazole qd. Will try to ramp up rx of both factors to see if we can calm her throat down. - Start NSW qd - continue allegra - increase omeprazole to bid temporarily - continue pred 5 and finish the augmentin - not clear that this is her asthma, sounds like all UA symptoms, although she confuses the two and calls it all asthma. It would be tempting to give her a break from Flovent to see if it would calm her throat down, since this is the driving problem at this time. She will continue for now. Asked her to use her nebs prn and not on a schedule.  - she will call later this week to update on her progress ROV Dr Annamaria Boots in 3 -4 weeks  08/25/11- 81 yoF followed for asthma complicated by allergic rhinitis, bronchitis, GERD, hx of atypical  AFB. Allergy vaccine- Dr Donneta Romberg. Hx brain tumor/ adrenal insufficiency. PCP Dr [name]  [name],fatigue deneis any wheezing, cough  .having  dizzy spells  more frequent. Had left knee replacement March 18 under spinal anesthesia. Wheezed immediately before surgery and was given a nebulizer treatment preop. Since then has done very  well and says she feels well today. Did have lightheadedness or dizziness which was evaluated with EKG and CT scan. History of meningioma 11 years ago.  10/09/2011 Acute OV  Complains of cough with clear mucus, wheezing, DOE, decreased energy x1week  Barky cough,  Exposed to paint fumes and cough started.  No fever or discolored mucus.  No hemoptysis , no edema.   11/10/11- 28 yoF followed for asthma complicated by allergic rhinitis, bronchitis, GERD, hx of atypical AFB. Hx brain tumor/ adrenal insufficiency. PCP Dr [name] Was seen by a nurse practitioner in May and treated with Augmentin and prednisone increased to 10 mg. She is now back to 5 mg of prednisone daily. Staying indoors to avoid the weather. Short of breath since left hip replacement surgery in March. Occasional sneeze. She dropped off of Dr. Rush Landmark allergy vaccine. Cough produces scant phlegm. Denies chest pain. She says recent echocardiogram and EKG were "fine". Dyspnea on exertion. She just stopped to rest making her bed.  CXR- 07/15/11-  IMPRESSION:  Mild hyperinflation. No active disease.  Original Report Authenticated By: Raelyn Number, M.D.   03/14/12- 81 yoF followed for asthma complicated by allergic rhinitis, bronchitis, GERD, hx of atypical AFB. Hx brain tumor/ adrenal insufficiency. PCP Dr [name]    husband here FOLLOWS FOR: still having alot of cough-thick foamy light green in color; afraid to take Avelox after reading about side effects because her right shoulder is still healing.  Acute illness started as a cold. Now deep rattle comes and goes, worse over the past 3 weeks but she sleeps well. Coughs out "clear foam". She remains on prednisone 5 mg daily.  04/29/12- 38 yoF followed for asthma complicated by allergic rhinitis, bronchitis, GERD, hx of atypical AFB. Hx brain tumor/ adrenal insufficiency. PCP Dr [name]    husband here ACUTE VISIT: went to Neuro MD yesterday for dizzy spells and unable to walk  straight-told  to follow up here for Increased SOB-states she can tell her O2 gets low; feels weak and dizzy; not using Combivent-feels like it burns her throat and needs Flovent refilled She is worried she may have a recurrence of her brain tumor. At her primary physician's office yesterday for prednisone was increased to 15 mg twice daily for 3 days then 10 mg twice daily for 3 days then 5 mg twice daily x3 days, then back to her 5 mg daily maintenance. She says a chest x-ray was done "no pneumonia" CXR 03/14/12 IMPRESSION:  No active disease. Mild hyperinflation again noted.  Original Report Authenticated By: Lahoma Crocker, M.D.   05/16/12-  FOLLOWS FOR: pt states her breathing has gotten worse since last OV. reports that her breathing is good in the AM but after lunch is worsens. lots of coughing and wheezing.  Husband here "Bad December. " She describes coughing paroxysms and feels well in between. No effect of cough syrup except that it helps her sleep. Husband hears crackling sounds when she is lying down, breathing quietly. She was on a prednisone taper back to her maintenance 5 mg daily. Finished Augmentin last night. Using Combivent 4 times daily. Tussive soreness upper anterior  chest wall. Scant productive white foamy sputum. No fever. Watery mucus from her nose triggered by coughing. She had 2 chest x-rays in November and one in December. One of them at her primary physician. All described as clear CT chest 08/21/2011 from [facility] shows some mild emphysema and arterial calcifications but otherwise negative.  06/13/12- 28 yoF followed for asthma complicated by allergic rhinitis, bronchitis, GERD, hx of atypical AFB. Hx brain tumor/ adrenal insufficiency. PCP Dr [name]   FOLLOWS FOR: still has productive cough at times; wonders in allergies are starting to flare up again-sneezing. Sneezing and watery rhinorrhea which she attributes to "allergy". Dry cough is much improved compared with last  visit. She finished her Biaxin 2 weeks ago. Continues prednisone 5 mg daily maintenance, plus her Flovent inhaler and uses rescue inhaler once or twice a day when needed. Sputum culture from 05/16/2012-normal flora. AFB smear negative.  08/09/12- 31 yoF followed for asthma complicated by allergic rhinitis, bronchitis, GERD, hx of atypical AFB. Hx brain tumor/ adrenal insufficiency. PCP Dr [name]  FOLLOWS [id number] any SOB, wheezing, cough, or congestion today. Would like to discuss Dymista(has been using as needed)-noticed it caused dry mouth before. Feels as comfortable with her breathing now if she ever gets. Occasional use of rescue inhaler but no significant cough or wheeze recently.  10/14/12- 3 yoF followed for asthma complicated by allergic rhinitis, bronchitis, GERD, hx of atypical AFB. Hx brain tumor/ adrenal insufficiency. PCP Dr [name]   Husband here ACUTE VISIT: Started feeling bad Wednesday last week; Thursday started wheezing, cough, SOB, fevers of 100's, not wanting to eat-nothing to eat today. She had done too much in yard work for 2 days. For the past 8 or 10 days has felt acutely ill with cough especially bad for the past 2 days. Deep cough, exhausted. Nebulizer does not help. Dr [name] also wanted to see if increasing maintenance prednisone would help her chronic dizziness.  02/09/13- 3 yoF followed for asthma complicated by allergic rhinitis, bronchitis, GERD, hx of atypical AFB. Hx brain tumor/ adrenal insufficiency. PCP Dr [name]   Husband here FOLLOWS JJH:ERDEYC she has had a few flare ups since last visit(used rescue inhaler maybe 3 times) Had flu vaccine We reviewed her recent hospitalization- salivary stone surgery complicated by bleeding diverticulum. Dymista helps sneeze and rhinorhea.  06/13/13-84 yoF followed for asthma complicated by allergic rhinitis, bronchitis, GERD, hx of atypical AFB. Hx brain tumor/ adrenal insufficiency. PCP Dr [name]   Husband here FOLLOWS  FOR: pt is currently having cough-productive-green; no fevers, runny nose and sneezing. Has been going on since Saturday.  06/30/13- 84 yoF followed for asthma complicated by allergic rhinitis, bronchitis, GERD, hx of atypical AFB. Hx brain tumor/ adrenal insufficiency. PCP Dr [name]   Husband here Acute hosp 2/13-2/17/15 for AE asthma/ bronchitis FOLLOWS FOR:  Breathing and cough have improved.  c/o: of stroke affecting (L) eye and causing dizzy spells Breathing has been much better since hospital, without wheeze now. Using just Flovent without needing her nebulizer. Tapering prednisone towards 5 mg daily maintenance dose. CXR 06/23/13 IMPRESSION:  Hyperexpanded lungs and bronchitic change without acute  cardiopulmonary disease. Specifically, no definite evidence of  pneumonia.  Electronically Signed  By: Sandi Mariscal M.D.  On: 06/23/2013 19:14  10/11/13- 63 yoF followed for asthma complicated by allergic rhinitis, bronchitis, GERD, hx of atypical AFB. Complicated by Hx brain tumor/ adrenal insufficiency, CVA. PCP Dr [name]   Husband here FOLLOWS FOR:Pt states she has been doing well since seen  last; had to use her rescue inhaler a few times since 06-2013 visit. Rare need for rescue inhaler but needs refill. Doing a lot of yard work. Continues prednisone maintenance 5 mg daily. Had CVA which left her dizzy and with decreased eyesight.  02/12/14-  21 yoF followed for asthma complicated by allergic rhinitis, bronchitis, GERD, hx of atypical AFB. Complicated by Hx brain tumor/ adrenal insufficiency, CVA. PCP Dr [name]   Husband here FOLLOW FOR: Constantly dizzy, cough in the mornings  08/13/14- 84 yoF followed for asthma complicated by allergic rhinitis, bronchitis, GERD, hx of atypical AFB. Complicated by Hx brain tumor/ adrenal insufficiency, CVA. PCP Dr [name]   Husband here FOLLOWS FOR: Increased asthma flares, pt went to beach and reports pollen was high and she was around two dogs in that house.  Using Albuterol and Flovent as directed. Minor cough and congestion since the pollen came out but Mucinex helps. Allegra controls watery rhinorrhea. Her albuterol and Flovent controlled asthma sufficiently. She has not had a serious flare in months.   ROS-see HPI Constitutional:   No-   weight loss, night sweats, +fevers, chills, fatigue, lassitude. HEENT:   No-  headaches, difficulty swallowing, tooth/dental problems, sore throat,       No-sneezing, itching, ear ache, +nasal congestion, no-post nasal drip,  CV:  No-   chest pain, orthopnea, PND, swelling in lower extremities, anasarca, dizziness, palpitations Resp: +shortness of breath with exertion or at rest.              No- productive cough, no- non-productive cough,  No- coughing up of blood.              No-   change in color of mucus.  No- wheezing.   Skin: No-   rash or lesions. GI:  No-   heartburn, indigestion, abdominal pain, nausea, vomiting,  GU:  MS:  No-   joint pain or swelling.  . Neuro-     +dizzy since ophthalmic artery stroke Psych:  No- change in mood or affect. No depression or anxiety.  No memory loss.  OBJ- Physical Exam General- Alert, Oriented, Affect-appropriate, Distress- none acute.  Skin- rash-none, lesions- none, excoriation- none Lymphadenopathy- none Head- atraumatic            Eyes- Gross vision intact, PERRLA, conjunctivae and secretions clear            Ears- Hearing, canals-normal            Nose- Clear, no-Septal dev, mucus, polyps, erosion, perforation             Throat- Mallampati II , mucosa clear , drainage- none, tonsils- atrophic. Neck- flexible , trachea midline, stridor-none, thyroid nl, carotid no bruit Chest - symmetrical excursion , unlabored           Heart/CV- RRR , no murmur , no gallop  , no rub, nl s1 s2                           - JVD- none , edema- none, stasis changes- none, varices- none           Lung-  Clear, wheeze-none, cough-none , dullness-none, rub- none            Chest wall-  Abd-  Br/ Gen/ Rectal- Not done, not indicated Extrem- cyanosis- none, clubbing, none, atrophy- none, strength- nl Neuro- grossly intact to observation

## 2014-08-19 NOTE — Assessment & Plan Note (Signed)
Recent minor exacerbation related to pollen exposure at the beach. This episode is controlled. She is very pleased at how well she has done through the winter without serious exacerbation Plan-we reviewed her medications

## 2014-11-05 ENCOUNTER — Other Ambulatory Visit: Payer: Self-pay

## 2014-11-07 ENCOUNTER — Other Ambulatory Visit: Payer: Self-pay | Admitting: Dermatology

## 2014-12-12 DIAGNOSIS — N182 Chronic kidney disease, stage 2 (mild): Secondary | ICD-10-CM | POA: Insufficient documentation

## 2014-12-12 DIAGNOSIS — J45909 Unspecified asthma, uncomplicated: Secondary | ICD-10-CM | POA: Insufficient documentation

## 2014-12-12 DIAGNOSIS — D649 Anemia, unspecified: Secondary | ICD-10-CM | POA: Insufficient documentation

## 2014-12-12 DIAGNOSIS — E2749 Other adrenocortical insufficiency: Secondary | ICD-10-CM | POA: Insufficient documentation

## 2014-12-12 DIAGNOSIS — F419 Anxiety disorder, unspecified: Secondary | ICD-10-CM | POA: Insufficient documentation

## 2014-12-12 DIAGNOSIS — Z8719 Personal history of other diseases of the digestive system: Secondary | ICD-10-CM | POA: Insufficient documentation

## 2014-12-12 DIAGNOSIS — R42 Dizziness and giddiness: Secondary | ICD-10-CM | POA: Insufficient documentation

## 2014-12-20 ENCOUNTER — Emergency Department (HOSPITAL_COMMUNITY)
Admission: EM | Admit: 2014-12-20 | Discharge: 2014-12-20 | Disposition: A | Discharge status: Home / Self Care | Payer: Medicare Other | Attending: Emergency Medicine | Admitting: Emergency Medicine

## 2014-12-20 ENCOUNTER — Emergency Department (HOSPITAL_COMMUNITY): Payer: Medicare Other

## 2014-12-20 ENCOUNTER — Encounter (HOSPITAL_COMMUNITY): Payer: Self-pay

## 2014-12-20 DIAGNOSIS — Z86018 Personal history of other benign neoplasm: Secondary | ICD-10-CM | POA: Diagnosis not present

## 2014-12-20 DIAGNOSIS — S0003XA Contusion of scalp, initial encounter: Secondary | ICD-10-CM | POA: Diagnosis not present

## 2014-12-20 DIAGNOSIS — I10 Essential (primary) hypertension: Secondary | ICD-10-CM | POA: Diagnosis not present

## 2014-12-20 DIAGNOSIS — M158 Other polyosteoarthritis: Secondary | ICD-10-CM | POA: Insufficient documentation

## 2014-12-20 DIAGNOSIS — S199XXA Unspecified injury of neck, initial encounter: Secondary | ICD-10-CM | POA: Diagnosis not present

## 2014-12-20 DIAGNOSIS — Z862 Personal history of diseases of the blood and blood-forming organs and certain disorders involving the immune mechanism: Secondary | ICD-10-CM | POA: Insufficient documentation

## 2014-12-20 DIAGNOSIS — Z7952 Long term (current) use of systemic steroids: Secondary | ICD-10-CM | POA: Insufficient documentation

## 2014-12-20 DIAGNOSIS — Y9389 Activity, other specified: Secondary | ICD-10-CM | POA: Insufficient documentation

## 2014-12-20 DIAGNOSIS — Z79899 Other long term (current) drug therapy: Secondary | ICD-10-CM | POA: Diagnosis not present

## 2014-12-20 DIAGNOSIS — R011 Cardiac murmur, unspecified: Secondary | ICD-10-CM | POA: Diagnosis not present

## 2014-12-20 DIAGNOSIS — Z7951 Long term (current) use of inhaled steroids: Secondary | ICD-10-CM | POA: Insufficient documentation

## 2014-12-20 DIAGNOSIS — W01198A Fall on same level from slipping, tripping and stumbling with subsequent striking against other object, initial encounter: Secondary | ICD-10-CM | POA: Insufficient documentation

## 2014-12-20 DIAGNOSIS — R9389 Abnormal findings on diagnostic imaging of other specified body structures: Secondary | ICD-10-CM

## 2014-12-20 DIAGNOSIS — Y998 Other external cause status: Secondary | ICD-10-CM | POA: Insufficient documentation

## 2014-12-20 DIAGNOSIS — E785 Hyperlipidemia, unspecified: Secondary | ICD-10-CM | POA: Insufficient documentation

## 2014-12-20 DIAGNOSIS — S0990XA Unspecified injury of head, initial encounter: Secondary | ICD-10-CM | POA: Diagnosis present

## 2014-12-20 DIAGNOSIS — Y92002 Bathroom of unspecified non-institutional (private) residence single-family (private) house as the place of occurrence of the external cause: Secondary | ICD-10-CM | POA: Insufficient documentation

## 2014-12-20 DIAGNOSIS — W19XXXA Unspecified fall, initial encounter: Secondary | ICD-10-CM

## 2014-12-20 DIAGNOSIS — K219 Gastro-esophageal reflux disease without esophagitis: Secondary | ICD-10-CM | POA: Insufficient documentation

## 2014-12-20 DIAGNOSIS — E039 Hypothyroidism, unspecified: Secondary | ICD-10-CM | POA: Insufficient documentation

## 2014-12-20 DIAGNOSIS — Y92009 Unspecified place in unspecified non-institutional (private) residence as the place of occurrence of the external cause: Secondary | ICD-10-CM

## 2014-12-20 DIAGNOSIS — S299XXA Unspecified injury of thorax, initial encounter: Secondary | ICD-10-CM | POA: Insufficient documentation

## 2014-12-20 LAB — CBC WITH DIFFERENTIAL/PLATELET
Basophils Absolute: 0 10*3/uL (ref 0.0–0.1)
Basophils Relative: 0 % (ref 0–1)
EOS PCT: 1 % (ref 0–5)
Eosinophils Absolute: 0.2 10*3/uL (ref 0.0–0.7)
HEMATOCRIT: 39.4 % (ref 36.0–46.0)
HEMOGLOBIN: 13.2 g/dL (ref 12.0–15.0)
LYMPHS ABS: 1.3 10*3/uL (ref 0.7–4.0)
LYMPHS PCT: 12 % (ref 12–46)
MCH: 28.1 pg (ref 26.0–34.0)
MCHC: 33.5 g/dL (ref 30.0–36.0)
MCV: 84 fL (ref 78.0–100.0)
MONOS PCT: 9 % (ref 3–12)
Monocytes Absolute: 0.9 10*3/uL (ref 0.1–1.0)
NEUTROS ABS: 8.3 10*3/uL — AB (ref 1.7–7.7)
Neutrophils Relative %: 78 % — ABNORMAL HIGH (ref 43–77)
Platelets: 299 10*3/uL (ref 150–400)
RBC: 4.69 MIL/uL (ref 3.87–5.11)
RDW: 14.5 % (ref 11.5–15.5)
WBC: 10.7 10*3/uL — ABNORMAL HIGH (ref 4.0–10.5)

## 2014-12-20 LAB — URINALYSIS, ROUTINE W REFLEX MICROSCOPIC
BILIRUBIN URINE: NEGATIVE
Glucose, UA: NEGATIVE mg/dL
KETONES UR: NEGATIVE mg/dL
Nitrite: NEGATIVE
Protein, ur: NEGATIVE mg/dL
Specific Gravity, Urine: 1.006 (ref 1.005–1.030)
UROBILINOGEN UA: 0.2 mg/dL (ref 0.0–1.0)
pH: 6.5 (ref 5.0–8.0)

## 2014-12-20 LAB — BASIC METABOLIC PANEL
Anion gap: 9 (ref 5–15)
BUN: 20 mg/dL (ref 6–20)
CHLORIDE: 100 mmol/L — AB (ref 101–111)
CO2: 26 mmol/L (ref 22–32)
CREATININE: 0.7 mg/dL (ref 0.44–1.00)
Calcium: 9.8 mg/dL (ref 8.9–10.3)
GFR calc Af Amer: >60 mL/min (ref >60)
GFR calc non Af Amer: >60 mL/min (ref >60)
GLUCOSE: 117 mg/dL — AB (ref 65–99)
Potassium: 3.6 mmol/L (ref 3.5–5.1)
Sodium: 135 mmol/L (ref 135–145)

## 2014-12-20 LAB — URINE MICROSCOPIC-ADD ON

## 2014-12-20 MED ORDER — ACETAMINOPHEN-CODEINE #3 300-30 MG PO TABS: 1.0000 | ORAL_TABLET | Freq: Four times a day (QID) | ORAL | Status: DC | PRN

## 2014-12-20 MED ORDER — MORPHINE SULFATE 4 MG/ML IJ SOLN: 4.0000 mg | Freq: Once | INTRAMUSCULAR | Status: DC

## 2014-12-20 MED ORDER — ONDANSETRON HCL 4 MG/2ML IJ SOLN: 4.0000 mg | Freq: Once | INTRAMUSCULAR | Status: DC

## 2014-12-20 MED ORDER — ACETAMINOPHEN 325 MG PO TABS
650.0000 mg | ORAL_TABLET | Freq: Once | ORAL | Status: AC
  Administered 2014-12-20: 650 mg via ORAL
  Filled 2014-12-20: qty 2

## 2014-12-20 MED ORDER — IOHEXOL 300 MG/ML  SOLN
75.0000 mL | Freq: Once | INTRAMUSCULAR | Status: AC | PRN
  Administered 2014-12-20: 75 mL via INTRAVENOUS

## 2014-12-20 NOTE — Discharge Instructions (Signed)
Please continue to apply ice pack to your scalp for comfort.  Take tylenol or take tylenol #3 for pain as needed.  Your urine has blood in it, which will need to be recheck by urologist or with your primary care provider.  There are also finding of several cysts on your pancreas and on your spleen.  Please discuss this with your doctor.    Facial or Scalp Contusion  A facial or scalp contusion is a deep bruise on the face or head. Contusions happen when an injury causes bleeding under the skin. Signs of bruising include pain, puffiness (swelling), and discolored skin. The contusion may turn blue, purple, or yellow. HOME CARE  Only take medicines as told by your doctor.  Put ice on the injured area.  Put ice in a plastic bag.  Place a towel between your skin and the bag.  Leave the ice on for 20 minutes, 2-3 times a day. GET HELP IF:  You have bite problems.  You have pain when chewing.  You are worried about your face not healing normally. GET HELP RIGHT AWAY IF:   You have severe pain or a headache and medicine does not help.  You are very tired or confused, or your personality changes.  You throw up (vomit).  You have a nosebleed that will not stop.  You see two of everything (double vision) or have blurry vision.  You have fluid coming from your nose or ear.  You have problems walking or using your arms or legs. MAKE SURE YOU:   Understand these instructions.  Will watch your condition.  Will get help right away if you are not doing well or get worse. Document Released: 04/16/2011 Document Revised: 02/15/2013 Document Reviewed: 12/08/2012 [facility] Patient Information 2015 [place], [place]. This information is not intended to replace advice given to you by your health care provider. Make sure you discuss any questions you have with your health care provider.

## 2014-12-20 NOTE — ED Notes (Signed)
Pt fell backwards from standing on the commode while hanging curtains, she has a hematoma on the back of her head and on the bottom of her neck, no bleeding

## 2014-12-20 NOTE — ED Provider Notes (Signed)
CSN: [id number]     Arrival date & time 12/20/14  1924 History   First MD Initiated Contact with Patient 12/20/14 1938     Chief Complaint  Patient presents with  . Fall     (Consider location/radiation/quality/duration/timing/severity/associated sxs/prior Treatment) HPI   81 year old female presents for evaluation of a recent fall.  patient reports she has history of benign meningioma status post surgery 15 years ago with recurrent balance problem. She was in the process of moving and today she was changing her shower. While standing on top of the commode, she lost her balance, fell backwards striking her head and neck against the tub.  She denies loss of consciousness but having difficulty getting up. She has to crawl out of the bathroom into the den to alert her husband who then brought her to the ER for further evaluation. At this time she reports 8 out of 10 pain to the back of her head, and her upper back. Increasing pain with deep breathing or with coughing. Reported history of asthma recently aggravated from a recent beach trip. She otherwise denies having increased shortness of breath, abdominal pain, low back pain, dysuria, focal numbness or weakness. She denies having chest pain unless when she takes deep breath.she is not on any blood thinning medication. She denies any precipitating symptoms prior to the fall aside from her usual balance issue.    Past Medical History  Diagnosis Date  . Ulcerative colitis   . Hypertension   . Hyperlipemia   . Diverticulitis, colon   . Other specified iron deficiency anemias   . Thrombocytosis   . Leukocytosis   . Esophageal reflux   . Chronic pharyngitis   . Allergic rhinitis   . Acute asthmatic bronchitis   . Hypothyroidism   . Cardiac murmur     DOES NOT CAUSE ANY SYMPTOMS  . Recurrent upper respiratory infection (URI)     ON 07/10/11 PT SAW DR. Tarri Fuller YOUNG FOR TX OF ACUTE BRONCHITIS --GIVEN  EXTRA PREDNISONE ( IN ADDITION TO THE DAILY  PREDNISONE PT TAKES)  AND PT FINISHED A Z-PAK--STILL HAS A COUGH TODAY 07/15/11-NO FEVER.  Marland Kitchen Blood transfusion   . Arthritis     HX OF RT SHOULDER BURSITIS-AND THE SHOULDER IS HURTING AT PRESENT, PAIN IN LEFT KNEE AT PRESENT  AND PAIN IN LEFT HIP  . IBS (irritable bowel syndrome)   . Balance problem     SINCE BRAIN TUMOR REMOVED IN 2002-BENIGN TUMOR-PT HAS ADRENAL INSUFFICIENCY AND TAKES DAILY PREDNISONE  . GI bleed 01/06/2013   Past Surgical History  Procedure Laterality Date  . Partial colectomy  2008  . Total hip arthroplasty  OCT 2006    right  . Vesicovaginal fistula closure w/ tah    . Thyroidectomy  1986  . Appendectomy    . Craniotomy    . Subglottal mucocoel  2000  . Meningioma resected  20O2  . Frontalis suspension  10-09-2010    lifting eyelids AND SECOND EYE SURGERY November 19, 2010  . Tonsillectomy  1938  . Abdominal hysterectomy  1985  . Dilation and curettage of uterus  1967  . Eye surgery      2003 -RIGHT CATARACT EXTRACTED AND LEFT WAS DONE IN 2004  . Wrist tendon lesion removed 2007    . Total hip arthroplasty  07/27/2011    Procedure: TOTAL HIP ARTHROPLASTY;  Surgeon: Gearlean Alf, MD;  Location: [facility] ORS;  Service: Orthopedics;  Laterality: Left;  . Colonoscopy N/A 01/05/2013  Procedure: COLONOSCOPY;  Surgeon: Cleotis Nipper, MD;  Location: [facility] ENDOSCOPY;  Service: Endoscopy;  Laterality: N/A;  . Esophagogastroduodenoscopy N/A 01/05/2013    Procedure: ESOPHAGOGASTRODUODENOSCOPY (EGD);  Surgeon: Cleotis Nipper, MD;  Location: [facility] ENDOSCOPY;  Service: Endoscopy;  Laterality: N/A;  . Colonoscopy N/A 01/04/2014    Procedure: COLONOSCOPY;  Surgeon: Winfield Cunas., MD;  Location: [facility] ENDOSCOPY;  Service: Endoscopy;  Laterality: N/A;  . Esophagogastroduodenoscopy N/A 06/26/2014    Procedure: ESOPHAGOGASTRODUODENOSCOPY (EGD);  Surgeon: Winfield Cunas., MD;  Location: [facility] ENDOSCOPY;  Service: Endoscopy;  Laterality: N/A;  . Flexible sigmoidoscopy N/A 06/26/2014     Procedure: FLEXIBLE SIGMOIDOSCOPY;  Surgeon: Winfield Cunas., MD;  Location: [facility] ENDOSCOPY;  Service: Endoscopy;  Laterality: N/A;   Family History  Problem Relation Age of Onset  . Heart attack Father     deceased   Social History  Substance Use Topics  . Smoking status: Never Smoker   . Smokeless tobacco: Never Used  . Alcohol Use: Yes     Comment: occ glass of wine   OB History    No data available     Review of Systems  All other systems reviewed and are negative.     Allergies  Biaxin; Ciprofloxacin; Codeine; Dilantin; Doxycycline; Restasis; and Sulfonamide derivatives  Home Medications   Prior to Admission medications   Medication Sig Start Date End Date Taking? Authorizing Provider  acetaminophen (TYLENOL) 500 MG tablet Take 500 mg by mouth every 6 (six) hours as needed for mild pain.    Historical Provider, MD  albuterol (PROVENTIL HFA;VENTOLIN HFA) 108 (90 BASE) MCG/ACT inhaler Inhale 2 puffs into the lungs every 6 (six) hours as needed for wheezing or shortness of breath. 10/11/13   Deneise Lever, MD  albuterol (PROVENTIL) (2.5 MG/3ML) 0.083% nebulizer solution Take 3 mLs (2.5 mg total) by nebulization every 6 (six) hours as needed for wheezing or shortness of breath. DX 493.90 06/14/13   Deneise Lever, MD  amLODipine (NORVASC) 2.5 MG tablet Take 2.5 mg by mouth daily after breakfast.    Historical Provider, MD  Artificial Tear Solution (BION TEARS) 0.1-0.3 % SOLN Place 1 drop into both eyes 2 (two) times daily as needed (dry eyes).     Historical Provider, MD  B Complex-C (B-COMPLEX WITH VITAMIN C) tablet Take 1 tablet by mouth daily.    Historical Provider, MD  cholecalciferol (VITAMIN D) 1000 UNITS tablet Take 1,000 Units by mouth daily.    Historical Provider, MD  fluticasone (FLOVENT HFA) 110 MCG/ACT inhaler Inhale 2 puffs into the lungs 2 (two) times daily.    Historical Provider, MD  levothyroxine (SYNTHROID, LEVOTHROID) 75 MCG tablet Take 75 mcg by mouth  daily before breakfast.     Historical Provider, MD  LORazepam (ATIVAN) 0.5 MG tablet Take 0.5 mg by mouth at bedtime as needed for sleep.     Historical Provider, MD  Multiple Vitamin (MULTIVITAMIN) tablet Take 1 tablet by mouth daily.    Historical Provider, MD  polyethylene glycol (MIRALAX / GLYCOLAX) packet Take 17 g by mouth daily. 01/05/14   Costin Karlyne Greenspan, MD  predniSONE (DELTASONE) 5 MG tablet Take 5 mg by mouth daily with breakfast.    Historical Provider, MD  ranitidine (ZANTAC) 150 MG tablet Take 150 mg by mouth as needed for heartburn.    Historical Provider, MD  ranitidine (ZANTAC) 75 MG tablet Take 75 mg by mouth as needed for heartburn.    Historical Provider,  MD  vitamin B-12 (CYANOCOBALAMIN) 250 MCG tablet Take 250 mcg by mouth daily.    Historical Provider, MD   BP 171/100 mmHg  Pulse 74  Temp(Src) 97.6 F (36.4 C) (Oral)  Resp 20  SpO2 100% Physical Exam  Constitutional: She is oriented to person, place, and time. She appears well-developed and well-nourished. No distress.  Well-appearing Caucasian female appears to be in no acute distress.  HENT:  Head: Normocephalic.  Large hematoma noted to the occiput of scalp that is tender to palpation but no crepitus and no active bleeding.  No hemotympanum, no septum hematoma, no malocclusion, no midface tenderness. No raccoons eyes or battle sign.  Eyes: Conjunctivae are normal.  Neck: Neck supple.  Cervical midline spine tenderness without crepitus or step-off. C-collar in place  Cardiovascular: Normal rate and regular rhythm.   Pulmonary/Chest: Effort normal and breath sounds normal. She exhibits tenderness (tenderness along bilateral thoracic and ribs on palpation without crepitus or emphysema.).  Abdominal: Soft. There is no tenderness.  Musculoskeletal:  Purposeful movement to all 4 extremities without focal weakness.  Neurological: She is alert and oriented to person, place, and time. She has normal strength. No  cranial nerve deficit or sensory deficit. GCS eye subscore is 4. GCS verbal subscore is 5. GCS motor subscore is 6.  Skin: No rash noted.  Psychiatric: She has a normal mood and affect.  Nursing note and vitals reviewed.   ED Course  Procedures (including critical care time)  Patient with history of balance issue, fell off a commode injuring the back of the head and neck along with the upper back. Workup initiated.  11:08 PM Head and neck CT scan shows no acute fractures or intracranial injury.chest CT scan shows no traumatic injury. There are incidental finding including cyst on the pancreas and on her spleen. I discussed this with patient and recommend outpatient follow-up with PCP for further evaluation to rule out malignancy. She also has blood in her urine. She has no abdominal pain and she acknowledged that she has had blood in her urine the past. Urologist is currently evaluate and manage that. At this time she is able to ambulate without difficulty. She received Tylenol for her headache. She will need to follow closely with PCP for further care. Rice therapy discussed. Concussion protocol discussed. Return precaution also discussed.  Pt also have been seen and evaluated by Dr. Mingo Amber.    Labs Review Labs Reviewed  URINALYSIS, ROUTINE W REFLEX MICROSCOPIC (NOT AT [facility]) - Abnormal; Notable for the following:    Hgb urine dipstick MODERATE (*)    Leukocytes, UA TRACE (*)    All other components within normal limits  CBC WITH DIFFERENTIAL/PLATELET - Abnormal; Notable for the following:    WBC 10.7 (*)    Neutrophils Relative % 78 (*)    Neutro Abs 8.3 (*)    All other components within normal limits  BASIC METABOLIC PANEL - Abnormal; Notable for the following:    Chloride 100 (*)    Glucose, Bld 117 (*)    All other components within normal limits  URINE MICROSCOPIC-ADD ON    Imaging Review Ct Head Wo Contrast  12/20/2014   CLINICAL DATA:  Pain following fall while hanging  curtains  EXAM: CT HEAD WITHOUT CONTRAST  CT CERVICAL SPINE WITHOUT CONTRAST  TECHNIQUE: Multidetector CT imaging of the head and cervical spine was performed following the standard protocol without intravenous contrast. Multiplanar CT image reconstructions of the cervical spine were also generated.  COMPARISON:  Brain MRI August 20, 2009  FINDINGS: CT HEAD FINDINGS  There is mild diffuse atrophy. There is no intracranial mass, hemorrhage, extra-axial fluid collection, or midline shift. There is encephalomalacia in the left occipital lobe in an area of previous surgery. Elsewhere there is mild small vessel disease in the centra semiovale bilaterally. No acute infarct is evident. There are craniotomy defects in the left parietal and occipital bone regions. No acute fracture. Mastoid air cells are clear.  CT CERVICAL SPINE FINDINGS  There is no fracture. Minimal retrolisthesis of C6 on C7 and retrolisthesis of C7 on T1 is felt to be due to underlying spondylosis. Prevertebral soft tissues and predental space regions are normal. There is moderately severe disc space narrowing at C3-4, C4-5, C5-6 and C6-7. There is facet hypertrophy at multiple levels. There is exit foraminal narrowing on the right at C2-3, at C3-4 on the left, at C4-5 on the left, at C5-6 bilaterally, and at C6-7 on the left. There is no frank disc extrusion or high-grade stenosis. Patient has had surgical removal of the right lobe of the thyroid.  IMPRESSION: CT head: Postoperative encephalomalacia left occipital lobe. Atrophy with periventricular small vessel disease. No intracranial mass, hemorrhage, or acute appearing infarct. No extra-axial fluid collections. Postoperative changes on the left. No acute fracture.  CT cervical spine: Multilevel spondylosis and osteoarthritic change. Slight spondylolisthesis at C6-7 and C7-T1 is felt to be due to underlying spondylosis. No acute fracture.   Electronically Signed   By: Lowella Grip III M.D.   On:  12/20/2014 21:28   Ct Chest W Contrast  12/20/2014   CLINICAL DATA:  Status post fall backward while standing on commode to hang curtains. Headache and right-sided chest and rib pain. Initial encounter.  EXAM: CT CHEST WITH CONTRAST  TECHNIQUE: Multidetector CT imaging of the chest was performed during intravenous contrast administration.  CONTRAST:  30mL OMNIPAQUE IOHEXOL 300 MG/ML  SOLN  COMPARISON:  Chest radiograph from 06/23/2013, and CTA of the chest performed 06/02/2010  FINDINGS: Mild bibasilar atelectasis is noted. No significant focal consolidation, pleural effusion or pneumothorax is seen. There is no evidence of pulmonary parenchymal contusion. No masses are identified.  The mediastinum is unremarkable in appearance, aside from calcification at the mitral valve. No mediastinal lymphadenopathy is seen. No pericardial effusion is identified. Mild scattered calcification is noted along the aortic arch. The great vessels are grossly unremarkable in appearance. 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 The visualized portions of the left thyroid lobe are grossly unremarkable. No axillary lymphadenopathy is seen.  The visualized portions of the liver are unremarkable. A 2.6 cm hypodensity at the periphery of the spleen may reflect remote ischemic injury, as a larger vague area of decreased attenuation was seen in 2012. A nonspecific 8 mm hypodensity is noted at the superior aspect of the spleen. Note is made of a 2.1 cm cystic lesion at the proximal body of the pancreas. This is increased in size from 8 mm in 2011.  No acute osseous abnormalities are seen.  IMPRESSION: 1. No evidence of traumatic injury to the chest. 2. Mild bibasilar atelectasis noted.  Lungs otherwise clear. 3. Calcification at the mitral valve. 4. 2.1 cm cystic lesion at the proximal body of the pancreas. This is increased in size from 8 mm in 2011. A very slow-growing tumor cannot be excluded. MRCP could be considered for further  evaluation, if the patient can hold her breath for the study. Would correlate with pancreatic  lab values, as deemed clinically appropriate. 5. 2.6 cm hypodensity at the periphery of the spleen may reflect remote ischemic injury, as a larger vague area of decreased attenuation was seen in 2012. Nonspecific 8 mm hypodensity at the superior aspect of the spleen.   Electronically Signed   By: Garald Balding M.D.   On: 12/20/2014 21:28   Ct Cervical Spine Wo Contrast  12/20/2014   CLINICAL DATA:  Pain following fall while hanging curtains  EXAM: CT HEAD WITHOUT CONTRAST  CT CERVICAL SPINE WITHOUT CONTRAST  TECHNIQUE: Multidetector CT imaging of the head and cervical spine was performed following the standard protocol without intravenous contrast. Multiplanar CT image reconstructions of the cervical spine were also generated.  COMPARISON:  Brain MRI August 20, 2009  FINDINGS: CT HEAD FINDINGS  There is mild diffuse atrophy. There is no intracranial mass, hemorrhage, extra-axial fluid collection, or midline shift. There is encephalomalacia in the left occipital lobe in an area of previous surgery. Elsewhere there is mild small vessel disease in the centra semiovale bilaterally. No acute infarct is evident. There are craniotomy defects in the left parietal and occipital bone regions. No acute fracture. Mastoid air cells are clear.  CT CERVICAL SPINE FINDINGS  There is no fracture. Minimal retrolisthesis of C6 on C7 and retrolisthesis of C7 on T1 is felt to be due to underlying spondylosis. Prevertebral soft tissues and predental space regions are normal. There is moderately severe disc space narrowing at C3-4, C4-5, C5-6 and C6-7. There is facet hypertrophy at multiple levels. There is exit foraminal narrowing on the right at C2-3, at C3-4 on the left, at C4-5 on the left, at C5-6 bilaterally, and at C6-7 on the left. There is no frank disc extrusion or high-grade stenosis. Patient has had surgical removal of the right  lobe of the thyroid.  IMPRESSION: CT head: Postoperative encephalomalacia left occipital lobe. Atrophy with periventricular small vessel disease. No intracranial mass, hemorrhage, or acute appearing infarct. No extra-axial fluid collections. Postoperative changes on the left. No acute fracture.  CT cervical spine: Multilevel spondylosis and osteoarthritic change. Slight spondylolisthesis at C6-7 and C7-T1 is felt to be due to underlying spondylosis. No acute fracture.   Electronically Signed   By: Lowella Grip III M.D.   On: 12/20/2014 21:28   I, Toye Rouillard, personally reviewed and evaluated these images and lab results as part of my medical decision-making.   EKG Interpretation   Date/Time:  Thursday December 20 2014 20:10:56 EDT Ventricular Rate:  99 PR Interval:  162 QRS Duration: 107 QT Interval:  365 QTC Calculation: 468 R Axis:   95 Text Interpretation:  Sinus rhythm Consider right ventricular hypertrophy  No significant change since last tracing Confirmed by Mingo Amber  MD, [place]  ([id number]) on 12/20/2014 8:59:36 PM      MDM   Final diagnoses:  Fall at home, initial encounter  Scalp contusion, initial encounter  Abnormal CT scan, chest    BP 164/78 mmHg  Pulse 92  Temp(Src) 97.6 F (36.4 C) (Oral)  Resp 20  SpO2 93%  I have reviewed nursing notes and vital signs. I personally viewed the imaging tests through PACS system and agrees with radiologist's intepretation I reviewed available ER/hospitalization records through the EMR     Domenic Moras, PA-C 12/20/14 [place], MD 12/21/14 1715

## 2015-02-11 ENCOUNTER — Ambulatory Visit (INDEPENDENT_AMBULATORY_CARE_PROVIDER_SITE_OTHER): Payer: Medicare Other | Admitting: Internal Medicine

## 2015-02-11 ENCOUNTER — Encounter: Payer: Self-pay | Admitting: Internal Medicine

## 2015-02-11 VITALS — BP 118/72 | HR 69 | Ht 64.0 in | Wt 126.2 lb

## 2015-02-11 DIAGNOSIS — J4520 Mild intermittent asthma, uncomplicated: Secondary | ICD-10-CM

## 2015-02-11 DIAGNOSIS — J300 Vasomotor rhinitis: Secondary | ICD-10-CM

## 2015-02-11 NOTE — Progress Notes (Signed)
Patient ID: ALAZNE QUANT, female    DOB: April 26, 1929, 81 y.o.   MRN: [id number]  HPI 10/27/10- 81 yoF followed for asthma complicated by allergic rhinitis, bronchitis, GERD, hx of atypical AFB. Allergy vaccine- Dr Donneta Romberg. Last here -June 27, 2010- PFTs reviewed at that visit. Since then has done very well with no significant flares. She feels getting away from winter viruses was key.  She just had blepharoplasty.   02/18/11-  70 yoF followed for asthma complicated by allergic rhinitis, bronchitis, GERD, hx of atypical AFB. Allergy vaccine- Dr Donneta Romberg. She has gotten to the summer and early fall feeling very well. Really minimal chest tightness or nasal congestion. She has not been needing her nebulizer meds or rescue inhaler. We discussed the change in delivery system for Combivent. She will get one more of the current style to keep available. She has not been needing antihistamines or nasal spray.  04/27/11-  79 yoF followed for asthma complicated by allergic rhinitis, bronchitis, GERD, hx of atypical AFB. Allergy vaccine- Dr Donneta Romberg. Hx brain tumor/ adrenal insufficiency.  Husband here. Has had flu vaccine. Did well until 10 days to 2 weeks ago. She had overnight onset of cough but seemed to improve until last night when she again got acutely ill with cough, clear foamy sputum, decreased appetite, sore throat, muscle aches. Temperature at home today was 100. She continues prednisone 5 mg daily for adrenal insufficiency after treatment for brain tumor. Due for hip replacement in March.  05/15/11-  69 yoF followed for asthma complicated by allergic rhinitis, bronchitis, GERD, hx of atypical AFB. Allergy vaccine- Dr Donneta Romberg. Hx brain tumor/ adrenal insufficiency.  Husband is with her. She continues to wheeze with the exacerbation for which we saw her December 17. Sputum was cultured but is now foamy and white. She denies fever as she finishes amoxicillin. Sleeping on 3 pillows. Her nebulizer machine  made her too nervous. She had originally taken the Zithromax pack before amoxicillin. Doxycycline caused nausea and she had to stop. She has gone to 2 or 3 separate social functions that she felt she had to attend although she remains very tight in the chest with much cough and shortness of breath.  07/10/11-  62 yoF followed for asthma complicated by allergic rhinitis, bronchitis, GERD, hx of atypical AFB. Allergy vaccine- Dr Donneta Romberg. Hx brain tumor/ adrenal insufficiency.  Pending hip replacement March 18. Feeling mild chest and head congestion over the past 2 days with increased cough. Last night was wheezing. Has not needed her nebulizer machine and denies fever or malaise. Husband just had colon cancer resected.   Acute OV 07/21/11 -- 81 yo woman, hx asthma that has been labile and affected by allergies, GERD. 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 Presents today noting . She was just treated with Augmentin starting 3/8, finished a pred taper from 3/1 back down to chronic 5mg  qd. She is on flovent, combivent prn, nebulized albuterol. Also fexofenedine and omeprazole.  She has had bouts of severe cough, UA noise, wheezing. No fevers, chills, sputum, CP. She is having PND. No GERD sx.  PHARYNGITIS, CHRONIC Flaring following apparent URI and resistant to rx with pred taper and also abx. Exacerbating factors are PND and GERD. She is taking allegra prn, omeprazole qd. Will try to ramp up rx of both factors to see if we can calm her throat down. - Start NSW qd - continue allegra - increase omeprazole to bid temporarily - continue pred 5 and finish  the augmentin - not clear that this is her asthma, sounds like all UA symptoms, although she confuses the two and calls it all asthma. It would be tempting to give her a break from Flovent to see if it would calm her throat down, since this is the driving problem at this time. She will continue for now. Asked her to use her nebs prn and not on a schedule.  - she  will call later this week to update on her progress ROV Dr Annamaria Boots in 3 -4 weeks  08/25/11- 81 yoF followed for asthma complicated by allergic rhinitis, bronchitis, GERD, hx of atypical AFB. Allergy vaccine- Dr Donneta Romberg. Hx brain tumor/ adrenal insufficiency. PCP Dr [name]  [name],fatigue deneis any wheezing, cough  .having  dizzy spells  more frequent. Had left knee replacement March 18 under spinal anesthesia. Wheezed immediately before surgery and was given a nebulizer treatment preop. Since then has done very well and says she feels well today. Did have lightheadedness or dizziness which was evaluated with EKG and CT scan. History of meningioma 11 years ago.  10/09/2011 Acute OV  Complains of cough with clear mucus, wheezing, DOE, decreased energy x1week  Barky cough,  Exposed to paint fumes and cough started.  No fever or discolored mucus.  No hemoptysis , no edema.   11/10/11- 22 yoF followed for asthma complicated by allergic rhinitis, bronchitis, GERD, hx of atypical AFB. Hx brain tumor/ adrenal insufficiency. PCP Dr [name] Was seen by a nurse practitioner in May and treated with Augmentin and prednisone increased to 10 mg. She is now back to 5 mg of prednisone daily. Staying indoors to avoid the weather. Short of breath since left hip replacement surgery in March. Occasional sneeze. She dropped off of Dr. Rush Landmark allergy vaccine. Cough produces scant phlegm. Denies chest pain. She says recent echocardiogram and EKG were "fine". Dyspnea on exertion. She just stopped to rest making her bed.  CXR- 07/15/11-  IMPRESSION:  Mild hyperinflation. No active disease.  Original Report Authenticated By: Raelyn Number, M.D.   03/14/12- 81 yoF followed for asthma complicated by allergic rhinitis, bronchitis, GERD, hx of atypical AFB. Hx brain tumor/ adrenal insufficiency. PCP Dr [name]    husband here FOLLOWS FOR: still having alot of cough-thick foamy light green in  color; afraid to take Avelox after reading about side effects because her right shoulder is still healing.  Acute illness started as a cold. Now deep rattle comes and goes, worse over the past 3 weeks but she sleeps well. Coughs out "clear foam". She remains on prednisone 5 mg daily.  04/29/12- 37 yoF followed for asthma complicated by allergic rhinitis, bronchitis, GERD, hx of atypical AFB. Hx brain tumor/ adrenal insufficiency. PCP Dr [name]    husband here ACUTE VISIT: went to Neuro MD yesterday for dizzy spells and unable to walk straight-told  to follow up here for Increased SOB-states she can tell her O2 gets low; feels weak and dizzy; not using Combivent-feels like it burns her throat and needs Flovent refilled She is worried she may have a recurrence of her brain tumor. At her primary physician's office yesterday for prednisone was increased to 15 mg twice daily for 3 days then 10 mg twice daily for 3 days then 5 mg twice daily x3 days, then back to her 5 mg daily maintenance. She says a chest x-ray was done "no pneumonia" CXR 03/14/12 IMPRESSION:  No active disease. Mild hyperinflation again  noted.  Original Report Authenticated By: Lahoma Crocker, M.D.   05/16/12-  FOLLOWS FOR: pt states her breathing has gotten worse since last OV. reports that her breathing is good in the AM but after lunch is worsens. lots of coughing and wheezing.  Husband here "Bad December. " She describes coughing paroxysms and feels well in between. No effect of cough syrup except that it helps her sleep. Husband hears crackling sounds when she is lying down, breathing quietly. She was on a prednisone taper back to her maintenance 5 mg daily. Finished Augmentin last night. Using Combivent 4 times daily. Tussive soreness upper anterior chest wall. Scant productive white foamy sputum. No fever. Watery mucus from her nose triggered by coughing. She had 2 chest x-rays in November and one in December. One of them at her primary  physician. All described as clear CT chest 08/21/2011 from [facility] shows some mild emphysema and arterial calcifications but otherwise negative.  06/13/12- 10 yoF followed for asthma complicated by allergic rhinitis, bronchitis, GERD, hx of atypical AFB. Hx brain tumor/ adrenal insufficiency. PCP Dr [name]   FOLLOWS FOR: still has productive cough at times; wonders in allergies are starting to flare up again-sneezing. Sneezing and watery rhinorrhea which she attributes to "allergy". Dry cough is much improved compared with last visit. She finished her Biaxin 2 weeks ago. Continues prednisone 5 mg daily maintenance, plus her Flovent inhaler and uses rescue inhaler once or twice a day when needed. Sputum culture from 05/16/2012-normal flora. AFB smear negative.  08/09/12- 83 yoF followed for asthma complicated by allergic rhinitis, bronchitis, GERD, hx of atypical AFB. Hx brain tumor/ adrenal insufficiency. PCP Dr [name]  FOLLOWS [id number] any SOB, wheezing, cough, or congestion today. Would like to discuss Dymista(has been using as needed)-noticed it caused dry mouth before. Feels as comfortable with her breathing now if she ever gets. Occasional use of rescue inhaler but no significant cough or wheeze recently.  10/14/12- 3 yoF followed for asthma complicated by allergic rhinitis, bronchitis, GERD, hx of atypical AFB. Hx brain tumor/ adrenal insufficiency. PCP Dr [name]   Husband here ACUTE VISIT: Started feeling bad Wednesday last week; Thursday started wheezing, cough, SOB, fevers of 100's, not wanting to eat-nothing to eat today. She had done too much in yard work for 2 days. For the past 8 or 10 days has felt acutely ill with cough especially bad for the past 2 days. Deep cough, exhausted. Nebulizer does not help. Dr [name] also wanted to see if increasing maintenance prednisone would help her chronic dizziness.  02/09/13- 3 yoF followed for asthma complicated by allergic rhinitis,  bronchitis, GERD, hx of atypical AFB. Hx brain tumor/ adrenal insufficiency. PCP Dr [name]   Husband here FOLLOWS [id number] she has had a few flare ups since last visit(used rescue inhaler maybe 3 times) Had flu vaccine We reviewed her recent hospitalization- salivary stone surgery complicated by bleeding diverticulum. Dymista helps sneeze and rhinorhea.  06/13/13-84 yoF followed for asthma complicated by allergic rhinitis, bronchitis, GERD, hx of atypical AFB. Hx brain tumor/ adrenal insufficiency. PCP Dr [name]   Husband here FOLLOWS FOR: pt is currently having cough-productive-green; no fevers, runny nose and sneezing. Has been going on since Saturday.  06/30/13- 84 yoF followed for asthma complicated by allergic rhinitis, bronchitis, GERD, hx of atypical AFB. Hx brain tumor/ adrenal insufficiency. PCP Dr [name]   Husband here Acute hosp 2/13-2/17/15 for AE asthma/ bronchitis FOLLOWS FOR:  Breathing and cough have improved.  c/o: of stroke  affecting (L) eye and causing dizzy spells Breathing has been much better since hospital, without wheeze now. Using just Flovent without needing her nebulizer. Tapering prednisone towards 5 mg daily maintenance dose. CXR 06/23/13 IMPRESSION:  Hyperexpanded lungs and bronchitic change without acute  cardiopulmonary disease. Specifically, no definite evidence of  pneumonia.  Electronically Signed  By: Sandi Mariscal M.D.  On: 06/23/2013 19:14   ROS-see HPI Constitutional:   No-   weight loss, night sweats, +fevers, chills, fatigue, lassitude. HEENT:   No-  headaches, difficulty swallowing, tooth/dental problems, sore throat,       No-sneezing, itching, ear ache, +nasal congestion, no-post nasal drip,  CV:  No-   chest pain, orthopnea, PND, swelling in lower extremities, anasarca, dizziness, palpitations Resp: + improved shortness of breath with exertion or at rest.              No- productive cough, no- non-productive cough,  No- coughing up of blood.               No-   change in color of mucus.  No- wheezing.   Skin: No-   rash or lesions. GI:  No-   heartburn, indigestion, abdominal pain, nausea, vomiting,  GU:  MS:  No-   joint pain or swelling.  . Neuro-     +dizzy since ophthalmic artery stroke Psych:  No- change in mood or affect. No depression or anxiety.  No memory loss.  OBJ- Physical Exam General- Alert, Oriented, Affect-appropriate, Distress- none acute.  Skin- rash-none, lesions- none, excoriation- none Lymphadenopathy- none Head- atraumatic            Eyes- Gross vision intact, PERRLA, conjunctivae and secretions clear            Ears- Hearing, canals-normal            Nose- Clear, no-Septal dev, mucus, polyps, erosion, perforation             Throat- Mallampati II , mucosa clear , drainage- none, tonsils- atrophic. A little hoarse with throat clearing. Neck- flexible , trachea midline, stridor-none, thyroid nl, carotid no bruit Chest - symmetrical excursion , unlabored           Heart/CV- RRR , no murmur , no gallop  , no rub, nl s1 s2                           - JVD- none , edema- none, stasis changes- none, varices- none           Lung-  Clear, wheeze-none, cough-none , dullness-none, rub- none           Chest wall-  Abd-  Br/ Gen/ Rectal- Not done, not indicated Extrem- cyanosis- none, clubbing, none, atrophy- none, strength- nl Neuro- grossly intact to observation       Patient ID: [name], female    DOB: [birth year], 81 y.o.   MRN: [id number]  HPI 10/27/10- 81 yoF followed for asthma complicated by allergic rhinitis, bronchitis, GERD, hx of atypical AFB. Allergy vaccine- Dr Donneta Romberg. Last here -June 27, 2010- PFTs reviewed at that visit. Since then has done very well with no significant flares. She feels getting away from winter viruses was key.  She just had blepharoplasty.   02/18/11-  69 yoF followed for asthma complicated by allergic rhinitis, bronchitis, GERD, hx of atypical AFB. Allergy vaccine-  Dr Donneta Romberg. She has gotten to the summer and early fall feeling very  well. Really minimal chest tightness or nasal congestion. She has not been needing her nebulizer meds or rescue inhaler. We discussed the change in delivery system for Combivent. She will get one more of the current style to keep available. She has not been needing antihistamines or nasal spray.  04/27/11-  32 yoF followed for asthma complicated by allergic rhinitis, bronchitis, GERD, hx of atypical AFB. Allergy vaccine- Dr Donneta Romberg. Hx brain tumor/ adrenal insufficiency.  Husband here. Has had flu vaccine. Did well until 10 days to 2 weeks ago. She had overnight onset of cough but seemed to improve until last night when she again got acutely ill with cough, clear foamy sputum, decreased appetite, sore throat, muscle aches. Temperature at home today was 100. She continues prednisone 5 mg daily for adrenal insufficiency after treatment for brain tumor. Due for hip replacement in March.  05/15/11-  62 yoF followed for asthma complicated by allergic rhinitis, bronchitis, GERD, hx of atypical AFB. Allergy vaccine- Dr Donneta Romberg. Hx brain tumor/ adrenal insufficiency.  Husband is with her. She continues to wheeze with the exacerbation for which we saw her December 17. Sputum was cultured but is now foamy and white. She denies fever as she finishes amoxicillin. Sleeping on 3 pillows. Her nebulizer machine made her too nervous. She had originally taken the Zithromax pack before amoxicillin. Doxycycline caused nausea and she had to stop. She has gone to 2 or 3 separate social functions that she felt she had to attend although she remains very tight in the chest with much cough and shortness of breath.  07/10/11-  59 yoF followed for asthma complicated by allergic rhinitis, bronchitis, GERD, hx of atypical AFB. Allergy vaccine- Dr Donneta Romberg. Hx brain tumor/ adrenal insufficiency.  Pending hip replacement March 18. Feeling mild chest and head congestion over  the past 2 days with increased cough. Last night was wheezing. Has not needed her nebulizer machine and denies fever or malaise. Husband just had colon cancer resected.   Acute OV 07/21/11 -- 81 yo woman, hx asthma that has been labile and affected by allergies, GERD. 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 Presents today noting . She was just treated with Augmentin starting 3/8, finished a pred taper from 3/1 back down to chronic 5mg  qd. She is on flovent, combivent prn, nebulized albuterol. Also fexofenedine and omeprazole.  She has had bouts of severe cough, UA noise, wheezing. No fevers, chills, sputum, CP. She is having PND. No GERD sx.  PHARYNGITIS, CHRONIC Flaring following apparent URI and resistant to rx with pred taper and also abx. Exacerbating factors are PND and GERD. She is taking allegra prn, omeprazole qd. Will try to ramp up rx of both factors to see if we can calm her throat down. - Start NSW qd - continue allegra - increase omeprazole to bid temporarily - continue pred 5 and finish the augmentin - not clear that this is her asthma, sounds like all UA symptoms, although she confuses the two and calls it all asthma. It would be tempting to give her a break from Flovent to see if it would calm her throat down, since this is the driving problem at this time. She will continue for now. Asked her to use her nebs prn and not on a schedule.  - she will call later this week to update on her progress ROV Dr Annamaria Boots in 3 -4 weeks  08/25/11- 81 yoF followed for asthma complicated by allergic rhinitis, bronchitis, GERD, hx of atypical  AFB. Allergy vaccine- Dr Donneta Romberg. Hx brain tumor/ adrenal insufficiency. PCP Dr [name]  [name],fatigue deneis any wheezing, cough  .having  dizzy spells  more frequent. Had left knee replacement March 18 under spinal anesthesia. Wheezed immediately before surgery and was given a nebulizer treatment preop. Since then has done very  well and says she feels well today. Did have lightheadedness or dizziness which was evaluated with EKG and CT scan. History of meningioma 11 years ago.  10/09/2011 Acute OV  Complains of cough with clear mucus, wheezing, DOE, decreased energy x1week  Barky cough,  Exposed to paint fumes and cough started.  No fever or discolored mucus.  No hemoptysis , no edema.   11/10/11- 28 yoF followed for asthma complicated by allergic rhinitis, bronchitis, GERD, hx of atypical AFB. Hx brain tumor/ adrenal insufficiency. PCP Dr [name] Was seen by a nurse practitioner in May and treated with Augmentin and prednisone increased to 10 mg. She is now back to 5 mg of prednisone daily. Staying indoors to avoid the weather. Short of breath since left hip replacement surgery in March. Occasional sneeze. She dropped off of Dr. Rush Landmark allergy vaccine. Cough produces scant phlegm. Denies chest pain. She says recent echocardiogram and EKG were "fine". Dyspnea on exertion. She just stopped to rest making her bed.  CXR- 07/15/11-  IMPRESSION:  Mild hyperinflation. No active disease.  Original Report Authenticated By: Raelyn Number, M.D.   03/14/12- 81 yoF followed for asthma complicated by allergic rhinitis, bronchitis, GERD, hx of atypical AFB. Hx brain tumor/ adrenal insufficiency. PCP Dr [name]    husband here FOLLOWS FOR: still having alot of cough-thick foamy light green in color; afraid to take Avelox after reading about side effects because her right shoulder is still healing.  Acute illness started as a cold. Now deep rattle comes and goes, worse over the past 3 weeks but she sleeps well. Coughs out "clear foam". She remains on prednisone 5 mg daily.  04/29/12- 38 yoF followed for asthma complicated by allergic rhinitis, bronchitis, GERD, hx of atypical AFB. Hx brain tumor/ adrenal insufficiency. PCP Dr [name]    husband here ACUTE VISIT: went to Neuro MD yesterday for dizzy spells and unable to walk  straight-told  to follow up here for Increased SOB-states she can tell her O2 gets low; feels weak and dizzy; not using Combivent-feels like it burns her throat and needs Flovent refilled She is worried she may have a recurrence of her brain tumor. At her primary physician's office yesterday for prednisone was increased to 15 mg twice daily for 3 days then 10 mg twice daily for 3 days then 5 mg twice daily x3 days, then back to her 5 mg daily maintenance. She says a chest x-ray was done "no pneumonia" CXR 03/14/12 IMPRESSION:  No active disease. Mild hyperinflation again noted.  Original Report Authenticated By: Lahoma Crocker, M.D.   05/16/12-  FOLLOWS FOR: pt states her breathing has gotten worse since last OV. reports that her breathing is good in the AM but after lunch is worsens. lots of coughing and wheezing.  Husband here "Bad December. " She describes coughing paroxysms and feels well in between. No effect of cough syrup except that it helps her sleep. Husband hears crackling sounds when she is lying down, breathing quietly. She was on a prednisone taper back to her maintenance 5 mg daily. Finished Augmentin last night. Using Combivent 4 times daily. Tussive soreness upper anterior  chest wall. Scant productive white foamy sputum. No fever. Watery mucus from her nose triggered by coughing. She had 2 chest x-rays in November and one in December. One of them at her primary physician. All described as clear CT chest 08/21/2011 from [facility] shows some mild emphysema and arterial calcifications but otherwise negative.  06/13/12- 28 yoF followed for asthma complicated by allergic rhinitis, bronchitis, GERD, hx of atypical AFB. Hx brain tumor/ adrenal insufficiency. PCP Dr [name]   FOLLOWS FOR: still has productive cough at times; wonders in allergies are starting to flare up again-sneezing. Sneezing and watery rhinorrhea which she attributes to "allergy". Dry cough is much improved compared with last  visit. She finished her Biaxin 2 weeks ago. Continues prednisone 5 mg daily maintenance, plus her Flovent inhaler and uses rescue inhaler once or twice a day when needed. Sputum culture from 05/16/2012-normal flora. AFB smear negative.  08/09/12- 31 yoF followed for asthma complicated by allergic rhinitis, bronchitis, GERD, hx of atypical AFB. Hx brain tumor/ adrenal insufficiency. PCP Dr [name]  FOLLOWS [id number] any SOB, wheezing, cough, or congestion today. Would like to discuss Dymista(has been using as needed)-noticed it caused dry mouth before. Feels as comfortable with her breathing now if she ever gets. Occasional use of rescue inhaler but no significant cough or wheeze recently.  10/14/12- 3 yoF followed for asthma complicated by allergic rhinitis, bronchitis, GERD, hx of atypical AFB. Hx brain tumor/ adrenal insufficiency. PCP Dr [name]   Husband here ACUTE VISIT: Started feeling bad Wednesday last week; Thursday started wheezing, cough, SOB, fevers of 100's, not wanting to eat-nothing to eat today. She had done too much in yard work for 2 days. For the past 8 or 10 days has felt acutely ill with cough especially bad for the past 2 days. Deep cough, exhausted. Nebulizer does not help. Dr [name] also wanted to see if increasing maintenance prednisone would help her chronic dizziness.  02/09/13- 3 yoF followed for asthma complicated by allergic rhinitis, bronchitis, GERD, hx of atypical AFB. Hx brain tumor/ adrenal insufficiency. PCP Dr [name]   Husband here FOLLOWS JJH:ERDEYC she has had a few flare ups since last visit(used rescue inhaler maybe 3 times) Had flu vaccine We reviewed her recent hospitalization- salivary stone surgery complicated by bleeding diverticulum. Dymista helps sneeze and rhinorhea.  06/13/13-84 yoF followed for asthma complicated by allergic rhinitis, bronchitis, GERD, hx of atypical AFB. Hx brain tumor/ adrenal insufficiency. PCP Dr [name]   Husband here FOLLOWS  FOR: pt is currently having cough-productive-green; no fevers, runny nose and sneezing. Has been going on since Saturday.  06/30/13- 84 yoF followed for asthma complicated by allergic rhinitis, bronchitis, GERD, hx of atypical AFB. Hx brain tumor/ adrenal insufficiency. PCP Dr [name]   Husband here Acute hosp 2/13-2/17/15 for AE asthma/ bronchitis FOLLOWS FOR:  Breathing and cough have improved.  c/o: of stroke affecting (L) eye and causing dizzy spells Breathing has been much better since hospital, without wheeze now. Using just Flovent without needing her nebulizer. Tapering prednisone towards 5 mg daily maintenance dose. CXR 06/23/13 IMPRESSION:  Hyperexpanded lungs and bronchitic change without acute  cardiopulmonary disease. Specifically, no definite evidence of  pneumonia.  Electronically Signed  By: Sandi Mariscal M.D.  On: 06/23/2013 19:14  10/11/13- 63 yoF followed for asthma complicated by allergic rhinitis, bronchitis, GERD, hx of atypical AFB. Complicated by Hx brain tumor/ adrenal insufficiency, CVA. PCP Dr [name]   Husband here FOLLOWS FOR:Pt states she has been doing well since seen  last; had to use her rescue inhaler a few times since 06-2013 visit. Rare need for rescue inhaler but needs refill. Doing a lot of yard work. Continues prednisone maintenance 5 mg daily. Had CVA which left her dizzy and with decreased eyesight.  02/12/14-  31 yoF followed for asthma complicated by allergic rhinitis, bronchitis, GERD, hx of atypical AFB. Complicated by Hx brain tumor/ adrenal insufficiency, CVA. PCP Dr [name]   Husband here FOLLOW FOR: Constantly dizzy, cough in the mornings  08/13/14- 84 yoF followed for asthma complicated by allergic rhinitis, bronchitis, GERD, hx of atypical AFB. Complicated by Hx brain tumor/ adrenal insufficiency, CVA. PCP Dr [name]   Husband here FOLLOWS FOR: Increased asthma flares, pt went to beach and reports pollen was high and she was around two dogs in that house.  Using Albuterol and Flovent as directed. Minor cough and congestion since the pollen came out but Mucinex helps. Allegra controls watery rhinorrhea. Her albuterol and Flovent controlled asthma sufficiently. She has not had a serious flare in months.  02/11/15-85 yoF followed for asthma complicated by allergic rhinitis, bronchitis, GERD, hx of atypical AFB. Complicated by Hx brain tumor/ adrenal insufficiency, CVA. PCP Dr [name]   Husband here  FOLLOWS FOR: pt. states she is doing well. no wheezing. occ. chest pain. no chest tightness. occ. dizzy. prod. cough green in color.  Has moved to [place]. Had flu vaccine. She fell and hit her head with workup including CT chest reviewed below. Has not needed her nebulizer machine in years. Uses rescue inhaler about once a month. No recognized asthma in the last 6 months. Sneeze and watery rhinorrhea in the mornings now. CT chest 12/20/14 images and report reviewed with her IMPRESSION: 1. No evidence of traumatic injury to the chest. 2. Mild bibasilar atelectasis noted. Lungs otherwise clear. 3. Calcification at the mitral valve. 4. 2.1 cm cystic lesion at the proximal body of the pancreas. This is increased in size from 8 mm in 2011. A very slow-growing tumor cannot be excluded. MRCP could be considered for further evaluation, if the patient can hold her breath for the study. Would correlate with pancreatic lab values, as deemed clinically appropriate. 5. 2.6 cm hypodensity at the periphery of the spleen may reflect remote ischemic injury, as a larger vague area of decreased attenuation was seen in 2012. Nonspecific 8 mm hypodensity at the superior aspect of the spleen. Electronically Signed  By: Garald Balding M.D.  On: 12/20/2014 21:28  ROS-see HPI Constitutional:   No-   weight loss, night sweats, +fevers, chills, fatigue, lassitude. HEENT:   No-  headaches, difficulty swallowing, tooth/dental problems, sore throat,   +, itching, ear  ache, +nasal congestion, + nasal drip,  CV:  No-   chest pain, orthopnea, PND, swelling in lower extremities, anasarca, dizziness, palpitations Resp: +shortness of breath with exertion or at rest.              No- productive cough, no- non-productive cough,  No- coughing up of blood.              No-   change in color of mucus.  No- wheezing.   Skin: No-   rash or lesions. GI:  No-   heartburn, indigestion, abdominal pain, nausea, vomiting,  GU:  MS:  No-   joint pain or swelling.  . Neuro-     +dizzy since ophthalmic artery stroke Psych:  No- change in mood or affect. No depression or anxiety.  No memory loss.  OBJ- Physical Exam General- Alert, Oriented, Affect-appropriate, Distress- none acute.  Skin- rash-none, lesions- none, excoriation- none Lymphadenopathy- none Head- atraumatic            Eyes- Gross vision intact, PERRLA, conjunctivae and secretions clear            Ears- Hearing, canals-normal            Nose- Clear, no-Septal dev, mucus, polyps, erosion, perforation             Throat- Mallampati II , mucosa clear , drainage- none, tonsils- atrophic. Neck- flexible , trachea midline, stridor-none, thyroid nl, carotid no bruit Chest - symmetrical excursion , unlabored           Heart/CV- RRR , no murmur , no gallop  , no rub, nl s1 s2                           - JVD- none , edema- none, stasis changes- none, varices- none           Lung-  Clear, wheeze-none, cough-none , dullness-none, rub- none           Chest wall-  Abd-  Br/ Gen/ Rectal- Not done, not indicated Extrem- cyanosis- none, clubbing, none, atrophy- none, strength- nl Neuro- grossly intact to observation

## 2015-02-11 NOTE — Patient Instructions (Addendum)
Ok to use [employer] as needed for runny nose  Please call as needed  You can ask Dr [name] what to do about the mass in your pancreas

## 2015-02-12 ENCOUNTER — Ambulatory Visit: Payer: Medicare Other | Admitting: Internal Medicine

## 2015-02-21 NOTE — Assessment & Plan Note (Signed)
Okay to use Flonase, saline nasal spray and antihistamines as long as they're sufficient.

## 2015-02-21 NOTE — Assessment & Plan Note (Signed)
Mild intermittent very well controlled now

## 2015-04-09 ENCOUNTER — Other Ambulatory Visit (HOSPITAL_COMMUNITY): Payer: Self-pay | Admitting: Internal Medicine

## 2015-04-09 DIAGNOSIS — K869 Disease of pancreas, unspecified: Secondary | ICD-10-CM

## 2015-04-16 ENCOUNTER — Encounter (HOSPITAL_COMMUNITY): Payer: Self-pay

## 2015-04-16 ENCOUNTER — Ambulatory Visit (HOSPITAL_COMMUNITY)
Admission: RE | Admit: 2015-04-16 | Discharge: 2015-04-16 | Disposition: A | Discharge status: Home / Self Care | Payer: Medicare Other | Source: Ambulatory Visit | Attending: Internal Medicine | Admitting: Internal Medicine

## 2015-04-16 DIAGNOSIS — K5730 Diverticulosis of large intestine without perforation or abscess without bleeding: Secondary | ICD-10-CM | POA: Insufficient documentation

## 2015-04-16 DIAGNOSIS — I7090 Unspecified atherosclerosis: Secondary | ICD-10-CM | POA: Diagnosis not present

## 2015-04-16 DIAGNOSIS — K869 Disease of pancreas, unspecified: Secondary | ICD-10-CM | POA: Diagnosis not present

## 2015-04-16 DIAGNOSIS — K760 Fatty (change of) liver, not elsewhere classified: Secondary | ICD-10-CM | POA: Insufficient documentation

## 2015-04-16 MED ORDER — IOHEXOL 300 MG/ML  SOLN
100.0000 mL | Freq: Once | INTRAMUSCULAR | Status: AC | PRN
  Administered 2015-04-16: 100 mL via INTRAVENOUS

## 2015-08-12 ENCOUNTER — Emergency Department (HOSPITAL_COMMUNITY): Payer: Medicare Other

## 2015-08-12 ENCOUNTER — Encounter (HOSPITAL_COMMUNITY): Payer: Self-pay | Admitting: Emergency Medicine

## 2015-08-12 ENCOUNTER — Emergency Department (HOSPITAL_COMMUNITY)
Admission: EM | Admit: 2015-08-12 | Discharge: 2015-08-12 | Disposition: A | Discharge status: Home / Self Care | Payer: Medicare Other | Attending: Emergency Medicine | Admitting: Emergency Medicine

## 2015-08-12 DIAGNOSIS — Z862 Personal history of diseases of the blood and blood-forming organs and certain disorders involving the immune mechanism: Secondary | ICD-10-CM | POA: Insufficient documentation

## 2015-08-12 DIAGNOSIS — J45901 Unspecified asthma with (acute) exacerbation: Secondary | ICD-10-CM | POA: Insufficient documentation

## 2015-08-12 DIAGNOSIS — E039 Hypothyroidism, unspecified: Secondary | ICD-10-CM | POA: Insufficient documentation

## 2015-08-12 DIAGNOSIS — Z79899 Other long term (current) drug therapy: Secondary | ICD-10-CM | POA: Insufficient documentation

## 2015-08-12 DIAGNOSIS — K219 Gastro-esophageal reflux disease without esophagitis: Secondary | ICD-10-CM | POA: Insufficient documentation

## 2015-08-12 DIAGNOSIS — Z7951 Long term (current) use of inhaled steroids: Secondary | ICD-10-CM | POA: Diagnosis not present

## 2015-08-12 DIAGNOSIS — M1990 Unspecified osteoarthritis, unspecified site: Secondary | ICD-10-CM | POA: Diagnosis not present

## 2015-08-12 DIAGNOSIS — I10 Essential (primary) hypertension: Secondary | ICD-10-CM | POA: Insufficient documentation

## 2015-08-12 DIAGNOSIS — R011 Cardiac murmur, unspecified: Secondary | ICD-10-CM | POA: Insufficient documentation

## 2015-08-12 DIAGNOSIS — R0602 Shortness of breath: Secondary | ICD-10-CM | POA: Diagnosis present

## 2015-08-12 LAB — BASIC METABOLIC PANEL
Anion gap: 17 — ABNORMAL HIGH (ref 5–15)
BUN: 24 mg/dL — AB (ref 6–20)
CO2: 20 mmol/L — ABNORMAL LOW (ref 22–32)
CREATININE: 1.06 mg/dL — AB (ref 0.44–1.00)
Calcium: 9.9 mg/dL (ref 8.9–10.3)
Chloride: 99 mmol/L — ABNORMAL LOW (ref 101–111)
GFR calc Af Amer: 54 mL/min — ABNORMAL LOW (ref >60)
GFR, EST NON AFRICAN AMERICAN: 47 mL/min — AB (ref >60)
Glucose, Bld: 175 mg/dL — ABNORMAL HIGH (ref 65–99)
Potassium: 4.7 mmol/L (ref 3.5–5.1)
SODIUM: 136 mmol/L (ref 135–145)

## 2015-08-12 LAB — CBC WITH DIFFERENTIAL/PLATELET
Basophils Absolute: 0 10*3/uL (ref 0.0–0.1)
Basophils Relative: 0 %
EOS ABS: 0 10*3/uL (ref 0.0–0.7)
EOS PCT: 0 %
HCT: 40.4 % (ref 36.0–46.0)
HEMOGLOBIN: 13.6 g/dL (ref 12.0–15.0)
LYMPHS PCT: 8 %
Lymphs Abs: 1.1 10*3/uL (ref 0.7–4.0)
MCH: 28.3 pg (ref 26.0–34.0)
MCHC: 33.7 g/dL (ref 30.0–36.0)
MCV: 84.2 fL (ref 78.0–100.0)
MONO ABS: 0.6 10*3/uL (ref 0.1–1.0)
Monocytes Relative: 4 %
NEUTROS PCT: 88 %
Neutro Abs: 12.2 10*3/uL — ABNORMAL HIGH (ref 1.7–7.7)
PLATELETS: 281 10*3/uL (ref 150–400)
RBC: 4.8 MIL/uL (ref 3.87–5.11)
RDW: 15.2 % (ref 11.5–15.5)
WBC: 13.9 10*3/uL — AB (ref 4.0–10.5)

## 2015-08-12 MED ORDER — PREDNISONE 20 MG PO TABS
40.0000 mg | ORAL_TABLET | Freq: Once | ORAL | Status: AC
Start: 2015-08-12 — End: 2015-08-12
  Administered 2015-08-12: 40 mg via ORAL
  Filled 2015-08-12: qty 2

## 2015-08-12 MED ORDER — ALBUTEROL SULFATE (2.5 MG/3ML) 0.083% IN NEBU
5.0000 mg | INHALATION_SOLUTION | Freq: Once | RESPIRATORY_TRACT | Status: AC
  Administered 2015-08-12: 5 mg via RESPIRATORY_TRACT
  Filled 2015-08-12: qty 6

## 2015-08-12 MED ORDER — PREDNISONE 20 MG PO TABS: ORAL_TABLET | ORAL | Status: DC

## 2015-08-12 MED ORDER — IPRATROPIUM-ALBUTEROL 0.5-2.5 (3) MG/3ML IN SOLN
3.0000 mL | RESPIRATORY_TRACT | Status: AC
  Administered 2015-08-12 (×3): 3 mL via RESPIRATORY_TRACT
  Filled 2015-08-12: qty 3
  Filled 2015-08-12: qty 6

## 2015-08-12 MED ORDER — BENZONATATE 100 MG PO CAPS: 100.0000 mg | ORAL_CAPSULE | Freq: Three times a day (TID) | ORAL | Status: DC

## 2015-08-12 NOTE — ED Notes (Signed)
Pt presents with a terrible cough in triage, hx of asthma, states she's had a worsening cold over the past week. States today she just can't catch her breath, productive deep cough with green sputum. Presents tachypnic and SOB in triage, denies pain

## 2015-08-12 NOTE — ED Notes (Signed)
X-ray at bedside

## 2015-08-12 NOTE — Discharge Instructions (Signed)
Use your inhaler every 4 hours while awake. Asthma, Acute Bronchospasm Acute bronchospasm caused by asthma is also referred to as an asthma attack. Bronchospasm means your air passages become narrowed. The narrowing is caused by inflammation and tightening of the muscles in the air tubes (bronchi) in your lungs. This can make it hard to breathe or cause you to wheeze and cough. CAUSES Possible triggers are:  Animal dander from the skin, hair, or feathers of animals.  Dust mites contained in house dust.  Cockroaches.  Pollen from trees or grass.  Mold.  Cigarette or tobacco smoke.  Air pollutants such as dust, household cleaners, hair sprays, aerosol sprays, paint fumes, strong chemicals, or strong odors.  Cold air or weather changes. Cold air may trigger inflammation. Winds increase molds and pollens in the air.  Strong emotions such as crying or laughing hard.  Stress.  Certain medicines such as aspirin or beta-blockers.  Sulfites in foods and drinks, such as dried fruits and wine.  Infections or inflammatory conditions, such as a flu, cold, or inflammation of the nasal membranes (rhinitis).  Gastroesophageal reflux disease (GERD). GERD is a condition where stomach acid backs up into your esophagus.  Exercise or strenuous activity. SIGNS AND SYMPTOMS   Wheezing.  Excessive coughing, particularly at night.  Chest tightness.  Shortness of breath. DIAGNOSIS  Your health care provider will ask you about your medical history and perform a physical exam. A chest X-ray or blood testing may be performed to look for other causes of your symptoms or other conditions that may have triggered your asthma attack. TREATMENT  Treatment is aimed at reducing inflammation and opening up the airways in your lungs. Most asthma attacks are treated with inhaled medicines. These include quick relief or rescue medicines (such as bronchodilators) and controller medicines (such as inhaled  corticosteroids). These medicines are sometimes given through an inhaler or a nebulizer. Systemic steroid medicine taken by mouth or given through an IV tube also can be used to reduce the inflammation when an attack is moderate or severe. Antibiotic medicines are only used if a bacterial infection is present.  HOME CARE INSTRUCTIONS   Rest.  Drink plenty of liquids. This helps the mucus to remain thin and be easily coughed up. Only use caffeine in moderation and do not use alcohol until you have recovered from your illness.  Do not smoke. Avoid being exposed to secondhand smoke.  You play a critical role in keeping yourself in good health. Avoid exposure to things that cause you to wheeze or to have breathing problems.  Keep your medicines up-to-date and available. Carefully follow your health care provider's treatment plan.  Take your medicine exactly as prescribed.  When pollen or pollution is bad, keep windows closed and use an air conditioner or go to places with air conditioning.  Asthma requires careful medical care. See your health care provider for a follow-up as advised. If you are more than [redacted] weeks pregnant and you were prescribed any new medicines, let your obstetrician know about the visit and how you are doing. Follow up with your health care provider as directed.  After you have recovered from your asthma attack, make an appointment with your outpatient doctor to talk about ways to reduce the likelihood of future attacks. If you do not have a doctor who manages your asthma, make an appointment with a primary care doctor to discuss your asthma. SEEK IMMEDIATE MEDICAL CARE IF:   You are getting worse.  You  have trouble breathing. If severe, call your local emergency services (911 in the [place]  You develop chest pain or discomfort.  You are vomiting.  You are not able to keep fluids down.  You are coughing up yellow, green, brown, or bloody sputum.  You have a fever  and your symptoms suddenly get worse.  You have trouble swallowing. MAKE SURE YOU:   Understand these instructions.  Will watch your condition.  Will get help right away if you are not doing well or get worse.   This information is not intended to replace advice given to you by your health care provider. Make sure you discuss any questions you have with your health care provider.   Document Released: 08/12/2006 Document Revised: 05/02/2013 Document Reviewed: 11/02/2012 Elsevier Interactive Patient Education [employer].

## 2015-08-12 NOTE — ED Provider Notes (Addendum)
CSN: [id number]     Arrival date & time 08/12/15  1820 History   First MD Initiated Contact with Patient 08/12/15 1836     Chief Complaint  Patient presents with  . Shortness of Breath  . Cough     (Consider location/radiation/quality/duration/timing/severity/associated sxs/prior Treatment) Patient is a 82 y.o. female presenting with shortness of breath and cough. The history is provided by the patient.  Shortness of Breath Severity:  Moderate Onset quality:  Gradual Duration:  1 week Timing:  Constant Chronicity:  New Context: URI   Relieved by:  Nothing Worsened by:  Nothing tried Ineffective treatments:  Inhaler Associated symptoms: cough   Associated symptoms: no chest pain, no fever, no headaches, no vomiting and no wheezing   Cough Associated symptoms: shortness of breath   Associated symptoms: no chest pain, no chills, no fever, no headaches, no myalgias, no rhinorrhea and no wheezing    82 yo F With a chief complaint of shortness of breath. Patient has a history of asthma has had a URI for the past week. Patient having significant cough is been worsening over this time. Feels like she's been having difficulty breathing for the past couple days. Trying her albuterol inhaler with minimal relief. Like today she is having significant difficulty with ambulation or she would have persistent coughing. Tried Robitussin without relief.  Past Medical History  Diagnosis Date  . Ulcerative colitis   . Hypertension   . Hyperlipemia   . Diverticulitis, colon   . Other specified iron deficiency anemias   . Thrombocytosis ([place])   . Leukocytosis   . Esophageal reflux   . Chronic pharyngitis   . Allergic rhinitis   . Acute asthmatic bronchitis   . Hypothyroidism   . Cardiac murmur     DOES NOT CAUSE ANY SYMPTOMS  . Recurrent upper respiratory infection (URI)     ON 07/10/11 PT SAW DR. Tarri Fuller YOUNG FOR TX OF ACUTE BRONCHITIS --GIVEN  EXTRA PREDNISONE ( IN ADDITION TO THE DAILY  PREDNISONE PT TAKES)  AND PT FINISHED A Z-PAK--STILL HAS A COUGH TODAY 07/15/11-NO FEVER.  Marland Kitchen Blood transfusion   . Arthritis     HX OF RT SHOULDER BURSITIS-AND THE SHOULDER IS HURTING AT PRESENT, PAIN IN LEFT KNEE AT PRESENT  AND PAIN IN LEFT HIP  . IBS (irritable bowel syndrome)   . Balance problem     SINCE BRAIN TUMOR REMOVED IN 2002-BENIGN TUMOR-PT HAS ADRENAL INSUFFICIENCY AND TAKES DAILY PREDNISONE  . GI bleed 01/06/2013   Past Surgical History  Procedure Laterality Date  . Partial colectomy  2008  . Total hip arthroplasty  OCT 2006    right  . Vesicovaginal fistula closure w/ tah    . Thyroidectomy  1986  . Appendectomy    . Craniotomy    . Subglottal mucocoel  2000  . Meningioma resected  20O2  . Frontalis suspension  10-09-2010    lifting eyelids AND SECOND EYE SURGERY November 19, 2010  . Tonsillectomy  1938  . Abdominal hysterectomy  1985  . Dilation and curettage of uterus  1967  . Eye surgery      2003 -RIGHT CATARACT EXTRACTED AND LEFT WAS DONE IN 2004  . Wrist tendon lesion removed 2007    . Total hip arthroplasty  07/27/2011    Procedure: TOTAL HIP ARTHROPLASTY;  Surgeon: Gearlean Alf, MD;  Location: [facility];  Service: Orthopedics;  Laterality: Left;  . Colonoscopy N/A 01/05/2013    Procedure: COLONOSCOPY;  Surgeon:  Cleotis Nipper, MD;  Location: [facility] ENDOSCOPY;  Service: Endoscopy;  Laterality: N/A;  . Esophagogastroduodenoscopy N/A 01/05/2013    Procedure: ESOPHAGOGASTRODUODENOSCOPY (EGD);  Surgeon: Cleotis Nipper, MD;  Location: [facility] END[facility]COPY;  Service: Endoscopy;  Laterality: N/A;  . Colonoscopy N/A 01/04/2014    Procedure: COLONOSCOPY;  Surgeon: Winfield Cunas., MD;  Location: [facility] ENDOSCOPY;  Service: Endoscopy;  Laterality: N/A;  . Esophagogastroduodenoscopy N/A 06/26/2014    Procedure: ESOPHAGOGASTRODUODENOSCOPY (EGD);  Surgeon: Winfield Cunas., MD;  Location: [facility] ENDOSCOPY;  Service: Endoscopy;  Laterality: N/A;  . Flexible sigmoidoscopy N/A 06/26/2014     Procedure: FLEXIBLE SIGMOIDOSCOPY;  Surgeon: Winfield Cunas., MD;  Location: [facility] ENDOSCOPY;  Service: Endoscopy;  Laterality: N/A;   Family History  Problem Relation Age of Onset  . Heart attack Father     deceased   Social History  Substance Use Topics  . Smoking status: Never Smoker   . Smokeless tobacco: Never Used  . Alcohol Use: Yes     Comment: occ glass of wine   OB History    No data available     Review of Systems  Constitutional: Negative for fever and chills.  HENT: Negative for congestion and rhinorrhea.   Eyes: Negative for redness and visual disturbance.  Respiratory: Positive for cough and shortness of breath. Negative for wheezing.   Cardiovascular: Negative for chest pain and palpitations.  Gastrointestinal: Negative for nausea and vomiting.  Genitourinary: Negative for dysuria and urgency.  Musculoskeletal: Negative for myalgias and arthralgias.  Skin: Negative for pallor and wound.  Neurological: Negative for dizziness and headaches.      Allergies  Biaxin; Ciprofloxacin hcl; Sulfa antibiotics; Ciprofloxacin; Codeine; Dilantin; Doxycycline; Restasis; and Sulfonamide derivatives  Home Medications   Prior to Admission medications   Medication Sig Start Date End Date Taking? Authorizing Provider  acetaminophen-codeine (TYLENOL #3) 300-30 MG per tablet Take 1-2 tablets by mouth every 6 (six) hours as needed for moderate pain. 12/20/14   Domenic Moras, PA-C  albuterol (PROVENTIL HFA;VENTOLIN HFA) 108 (90 BASE) MCG/ACT inhaler Inhale 2 puffs into the lungs every 6 (six) hours as needed for wheezing or shortness of breath. 10/11/13   Deneise Lever, MD  albuterol (PROVENTIL) (2.5 MG/3ML) 0.083% nebulizer solution Take 3 mLs (2.5 mg total) by nebulization every 6 (six) hours as needed for wheezing or shortness of breath. DX 493.90 Patient taking differently: Take 2.5 mg by nebulization 2 (two) times daily. DX 493.90 06/14/13   Deneise Lever, MD   Alpha-D-Galactosidase [facility]) TABS Take 2 tablets by mouth daily as needed (gas).    Historical Provider, MD  amLODipine (NORVASC) 2.5 MG tablet Take 2.5 mg by mouth daily after breakfast.    Historical Provider, MD  Artificial Tear Solution (BION TEARS) 0.1-0.3 % SOLN Place 1 drop into both eyes 2 (two) times daily as needed (dry eyes).     Historical Provider, MD  B Complex-C (B-COMPLEX WITH VITAMIN C) tablet Take 1 tablet by mouth daily.    Historical Provider, MD  cholecalciferol (VITAMIN D) 1000 UNITS tablet Take 1,000 Units by mouth daily.    Historical Provider, MD  fluticasone (FLOVENT HFA) 110 MCG/ACT inhaler Inhale 2 puffs into the lungs 2 (two) times daily.    Historical Provider, MD  lactase (LACTAID) 3000 UNITS tablet Take 3,000 Units by mouth daily as needed (lactose intolerant).    Historical Provider, MD  levothyroxine (SYNTHROID, LEVOTHROID) 75 MCG tablet Take 75 mcg by mouth daily before breakfast.  Historical Provider, MD  LIALDA 1.2 G EC tablet TAKE 2 TABLETS BY MOUTH EVERY MORNING 11/16/14   Historical Provider, MD  LORazepam (ATIVAN) 0.5 MG tablet Take 0.5 mg by mouth at bedtime as needed for sleep.     Historical Provider, MD  Multiple Vitamin (MULTIVITAMIN) tablet Take 1 tablet by mouth daily.    Historical Provider, MD  polyethylene glycol (MIRALAX / GLYCOLAX) packet Take 17 g by mouth daily. 01/05/14   Costin Karlyne Greenspan, MD  predniSONE (DELTASONE) 5 MG tablet Take 5 mg by mouth daily with breakfast.    Historical Provider, MD  ranitidine (ZANTAC) 75 MG tablet Take 75 mg by mouth daily.     Historical Provider, MD  simethicone (MYLICON) 80 MG chewable tablet Chew 80 mg by mouth every 6 (six) hours as needed for flatulence (gas).    Historical Provider, MD  vitamin B-12 (CYANOCOBALAMIN) 250 MCG tablet Take 250 mcg by mouth daily.    Historical Provider, MD   BP 149/61 mmHg  Pulse 95  Temp(Src) 97.8 F (36.6 C) (Oral)  Resp 18  SpO2 96% Physical Exam  Constitutional:  She is oriented to person, place, and time. She appears well-developed and well-nourished. No distress.  HENT:  Head: Normocephalic and atraumatic.  Swollen turbinates, posterior nasal drip, no noted sinus ttp, tm normal bilaterally.    Eyes: EOM are normal. Pupils are equal, round, and reactive to light.  Neck: Normal range of motion. Neck supple.  Cardiovascular: Normal rate and regular rhythm.  Exam reveals no gallop and no friction rub.   No murmur heard. Pulmonary/Chest: Effort normal. She has no wheezes. She has rhonchi (diffuse, likely transmitted upperairway noise). She has no rales.  Abdominal: Soft. She exhibits no distension. There is no tenderness.  Musculoskeletal: She exhibits no edema or tenderness.  Neurological: She is alert and oriented to person, place, and time.  Skin: Skin is warm and dry. She is not diaphoretic.  Psychiatric: She has a normal mood and affect. Her behavior is normal.  Nursing note and vitals reviewed.   ED Course  Procedures (including critical care time) Labs Review Labs Reviewed  CBC WITH DIFFERENTIAL/PLATELET - Abnormal; Notable for the following:    WBC 13.9 (*)    All other components within normal limits  BASIC METABOLIC PANEL - Abnormal; Notable for the following:    Chloride 99 (*)    CO2 20 (*)    Glucose, Bld 175 (*)    BUN 24 (*)    Creatinine, Ser 1.06 (*)    GFR calc non Af Amer 47 (*)    GFR calc Af Amer 54 (*)    Anion gap 17 (*)    All other components within normal limits    Imaging Review Dg Chest Port 1 View  08/12/2015  CLINICAL DATA:  Cough, history of asthma, shortness of breath EXAM: PORTABLE CHEST 1 VIEW COMPARISON:  12/20/2014 FINDINGS: Cardiomegaly again noted. Mild hyperinflation. No pulmonary edema. There is streaky left basilar atelectasis or early infiltrate. IMPRESSION: Mild hyperinflation. Cardiomegaly. Streaky left basilar atelectasis or early infiltrate. Electronically Signed   By: Lahoma Crocker M.D.   On:  08/12/2015 19:14   I have personally reviewed and evaluated these images and lab results as part of my medical decision-making.   EKG Interpretation   Date/Time:  Monday August 12 2015 18:47:38 EDT Ventricular Rate:  96 PR Interval:  147 QRS Duration: 112 QT Interval:  353 QTC Calculation: 446 R Axis:   92  Text Interpretation:  Sinus arrhythmia Borderline intraventricular  conduction delay Low voltage, precordial leads background noise No  significant change since last tracing Confirmed by Martin Belling MD, DANIEL  [id number]) on 08/13/2015 12:11:49 AM      MDM   Final diagnoses:  None    62 yoF with asthma exacerbation. Was secondary to URI. Chest x-ray without pneumonia. Was given breathing treatments with significant improvement. Steroids.  Patient feeling much better requesting discharge home. We'll give first dose of steroids. Sees her pulmonologist tomorrow.   I have discussed the diagnosis/risks/treatment options with the patient and family and believe the pt to be eligible for discharge home to follow-up with Pulm. We also discussed returning to the ED immediately if new or worsening sx occur. We discussed the sx which are most concerning (e.g., sudden worsening sob, fever, need to use inhaler more often than every 4 hours. ) that necessitate immediate return. Medications administered to the patient during their visit and any new prescriptions provided to the patient are listed below.  Medications given during this visit Medications  ipratropium-albuterol (DUONEB) 0.5-2.5 (3) MG/3ML nebulizer solution 3 mL (3 mLs Nebulization Given 08/12/15 1914)  albuterol (PROVENTIL) (2.5 MG/3ML) 0.083% nebulizer solution 5 mg (5 mg Nebulization Given 08/12/15 1841)  predniSONE (DELTASONE) tablet 40 mg (40 mg Oral Given 08/12/15 1902)    Discharge Medication List as of 08/12/2015  9:04 PM    START taking these medications   Details  benzonatate (TESSALON) 100 MG capsule Take 1 capsule (100 mg total) by  mouth every 8 (eight) hours., Starting 08/12/2015, Until Discontinued, Print    !! predniSONE (DELTASONE) 20 MG tablet 2 tabs po daily x 4 days, Print     !! - Potential duplicate medications found. Please discuss with provider.      The patient appears reasonably screen and/or stabilized for discharge and I doubt any other medical condition or other [facility] requiring further screening, evaluation, or treatment in the ED at this time prior to discharge.      Deno Etienne, DO 08/13/15 [place], DO 08/13/15 [id number]

## 2015-08-13 ENCOUNTER — Ambulatory Visit (INDEPENDENT_AMBULATORY_CARE_PROVIDER_SITE_OTHER): Payer: Medicare Other | Admitting: Internal Medicine

## 2015-08-13 ENCOUNTER — Encounter: Payer: Self-pay | Admitting: Internal Medicine

## 2015-08-13 ENCOUNTER — Telehealth: Payer: Self-pay | Admitting: Internal Medicine

## 2015-08-13 VITALS — BP 118/70 | HR 91 | Ht 64.0 in | Wt 123.8 lb

## 2015-08-13 DIAGNOSIS — J4521 Mild intermittent asthma with (acute) exacerbation: Secondary | ICD-10-CM

## 2015-08-13 DIAGNOSIS — K51811 Other ulcerative colitis with rectal bleeding: Secondary | ICD-10-CM | POA: Diagnosis not present

## 2015-08-13 DIAGNOSIS — J4541 Moderate persistent asthma with (acute) exacerbation: Secondary | ICD-10-CM | POA: Diagnosis not present

## 2015-08-13 MED ORDER — AZITHROMYCIN 250 MG PO TABS: ORAL_TABLET | ORAL | Status: DC

## 2015-08-13 MED ORDER — METHYLPREDNISOLONE ACETATE 80 MG/ML IJ SUSP
80.0000 mg | Freq: Once | INTRAMUSCULAR | Status: AC
  Administered 2015-08-13: 80 mg via INTRAMUSCULAR

## 2015-08-13 MED ORDER — LEVALBUTEROL HCL 0.63 MG/3ML IN NEBU
0.6300 mg | INHALATION_SOLUTION | Freq: Once | RESPIRATORY_TRACT | Status: AC
Start: 2015-08-13 — End: 2015-08-13
  Administered 2015-08-13: 0.63 mg via RESPIRATORY_TRACT

## 2015-08-13 MED ORDER — PREDNISONE 10 MG PO TABS: 10.0000 mg | ORAL_TABLET | Freq: Every day | ORAL | Status: DC

## 2015-08-13 NOTE — Patient Instructions (Addendum)
Neb xop 0.63      Dx asthma moderate acute exacerbation  Depo 80  Script sent for Zpak  Script sent for prednisone taper using 10 mg tabs.  When you run out of this, resume your regular daily maintenance of 20 mg daily for colitis, using your 5 mg tabs  Call Dr Johnnette Gourd office and ask how long he wants you to continue your prednisone. He would be the one to refill it if needed for colitis.

## 2015-08-13 NOTE — Progress Notes (Signed)
[name], female    DOB: 17-Apr-1929, 82 y.o.   MRN: [id number]  HPI 10/27/10- 82 yoF followed for asthma complicated by allergic rhinitis, bronchitis, GERD, hx of atypical AFB. Allergy vaccine- Dr Donneta Romberg. Last here -June 27, 2010- PFTs reviewed at that visit. Since then has done very well with no significant flares. She feels getting away from winter viruses was key.  She just had blepharoplasty.   02/18/11-  82 yoF followed for asthma complicated by allergic rhinitis, bronchitis, GERD, hx of atypical AFB. Allergy vaccine- Dr Donneta Romberg. She has gotten to the summer and early fall feeling very well. Really minimal chest tightness or nasal congestion. She has not been needing her nebulizer meds or rescue inhaler. We discussed the change in delivery system for Combivent. She will get one more of the current style to keep available. She has not been needing antihistamines or nasal spray.  04/27/11-  82 yoF followed for asthma complicated by allergic rhinitis, bronchitis, GERD, hx of atypical AFB. Allergy vaccine- Dr Donneta Romberg. Hx brain tumor/ adrenal insufficiency.  Husband here. Has had flu vaccine. Did well until 10 days to 2 weeks ago. She had overnight onset of cough but seemed to improve until last night when she again got acutely ill with cough, clear foamy sputum, decreased appetite, sore throat, muscle aches. Temperature at home today was 100. She continues prednisone 5 mg daily for adrenal insufficiency after treatment for brain tumor. Due for hip replacement in March.  05/15/11-  82 yoF followed for asthma complicated by allergic rhinitis, bronchitis, GERD, hx of atypical AFB. Allergy vaccine- Dr Donneta Romberg. Hx brain tumor/ adrenal insufficiency.  Husband is with her. She continues to wheeze with the exacerbation for which we saw her December 17. Sputum was cultured but is now foamy and white. She denies fever as she finishes amoxicillin. Sleeping on 3 pillows. Her nebulizer  machine made her too nervous. She had originally taken the Zithromax pack before amoxicillin. Doxycycline caused nausea and she had to stop. She has gone to 2 or 3 separate social functions that she felt she had to attend although she remains very tight in the chest with much cough and shortness of breath.  07/10/11-  82 yoF followed for asthma complicated by allergic rhinitis, bronchitis, GERD, hx of atypical AFB. Allergy vaccine- Dr Donneta Romberg. Hx brain tumor/ adrenal insufficiency.  Pending hip replacement March 18. Feeling mild chest and head congestion over the past 2 days with increased cough. Last night was wheezing. Has not needed her nebulizer machine and denies fever or malaise. Husband just had colon cancer resected.   Acute OV 07/21/11 -- 82 yo woman, hx asthma that has been labile and affected by allergies, GERD. 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 Presents today noting . She was just treated with Augmentin starting 3/8, finished a pred taper from 3/1 back down to chronic 5mg  qd. She is on flovent, combivent prn, nebulized albuterol. Also fexofenedine and omeprazole.  She has had bouts of severe cough, UA noise, wheezing. No fevers, chills, sputum, CP. She is having PND. No GERD sx.  PHARYNGITIS, CHRONIC Flaring following apparent URI and resistant to rx with pred taper and also abx. Exacerbating factors are PND and GERD. She is taking allegra prn, omeprazole qd. Will try to ramp up rx of both factors to see if we can calm her throat down. - Start NSW qd - continue allegra - increase omeprazole to bid temporarily - continue pred 5 and  finish the augmentin - not clear that this is her asthma, sounds like all UA symptoms, although she confuses the two and calls it all asthma. It would be tempting to give her a break from Flovent to see if it would calm her throat down, since this is the driving problem at this time. She will continue for now. Asked her to use her nebs prn and not on a schedule.   - she will call later this week to update on her progress ROV Dr Annamaria Boots in 3 -4 weeks  08/25/11- 82 yoF followed for asthma complicated by allergic rhinitis, bronchitis, GERD, hx of atypical AFB. Allergy vaccine- Dr Donneta Romberg. Hx brain tumor/ adrenal insufficiency. PCP Dr [name]  [name],fatigue deneis any wheezing, cough  .having  dizzy spells  more frequent. Had left knee replacement March 18 under spinal anesthesia. Wheezed immediately before surgery and was given a nebulizer treatment preop. Since then has done very well and says she feels well today. Did have lightheadedness or dizziness which was evaluated with EKG and CT scan. History of meningioma 11 years ago.  10/09/2011 Acute OV  Complains of cough with clear mucus, wheezing, DOE, decreased energy x1week  Barky cough,  Exposed to paint fumes and cough started.  No fever or discolored mucus.  No hemoptysis , no edema.   11/10/11- 82 yoF followed for asthma complicated by allergic rhinitis, bronchitis, GERD, hx of atypical AFB. Hx brain tumor/ adrenal insufficiency. PCP Dr [name] Was seen by a nurse practitioner in May and treated with Augmentin and prednisone increased to 10 mg. She is now back to 5 mg of prednisone daily. Staying indoors to avoid the weather. Short of breath since left hip replacement surgery in March. Occasional sneeze. She dropped off of Dr. Rush Landmark allergy vaccine. Cough produces scant phlegm. Denies chest pain. She says recent echocardiogram and EKG were "fine". Dyspnea on exertion. She just stopped to rest making her bed.  CXR- 07/15/11-  IMPRESSION:  Mild hyperinflation. No active disease.  Original Report Authenticated By: Raelyn Number, M.D.   03/14/12- 82 yoF followed for asthma complicated by allergic rhinitis, bronchitis, GERD, hx of atypical AFB. Hx brain tumor/ adrenal insufficiency. PCP Dr [name]    husband here FOLLOWS FOR: still having alot of cough-thick foamy light  green in color; afraid to take Avelox after reading about side effects because her right shoulder is still healing.  Acute illness started as a cold. Now deep rattle comes and goes, worse over the past 3 weeks but she sleeps well. Coughs out "clear foam". She remains on prednisone 5 mg daily.  04/29/12- 15 yoF followed for asthma complicated by allergic rhinitis, bronchitis, GERD, hx of atypical AFB. Hx brain tumor/ adrenal insufficiency. PCP Dr [name]    husband here ACUTE VISIT: went to Neuro MD yesterday for dizzy spells and unable to walk straight-told  to follow up here for Increased SOB-states she can tell her O2 gets low; feels weak and dizzy; not using Combivent-feels like it burns her throat and needs Flovent refilled She is worried she may have a recurrence of her brain tumor. At her primary physician's office yesterday for prednisone was increased to 15 mg twice daily for 3 days then 10 mg twice daily for 3 days then 5 mg twice daily x3 days, then back to her 5 mg daily maintenance. She says a chest x-ray was done "no pneumonia" CXR 03/14/12 IMPRESSION:  No active disease. Mild hyperinflation  again noted.  Original Report Authenticated By: Lahoma Crocker, M.D.   05/16/12-  FOLLOWS FOR: pt states her breathing has gotten worse since last OV. reports that her breathing is good in the AM but after lunch is worsens. lots of coughing and wheezing.  Husband here "Bad December. " She describes coughing paroxysms and feels well in between. No effect of cough syrup except that it helps her sleep. Husband hears crackling sounds when she is lying down, breathing quietly. She was on a prednisone taper back to her maintenance 5 mg daily. Finished Augmentin last night. Using Combivent 4 times daily. Tussive soreness upper anterior chest wall. Scant productive white foamy sputum. No fever. Watery mucus from her nose triggered by coughing. She had 2 chest x-rays in November and one in December. One of them at her  primary physician. All described as clear CT chest 08/21/2011 from [facility] shows some mild emphysema and arterial calcifications but otherwise negative.  06/13/12- 41 yoF followed for asthma complicated by allergic rhinitis, bronchitis, GERD, hx of atypical AFB. Hx brain tumor/ adrenal insufficiency. PCP Dr [name]   FOLLOWS FOR: still has productive cough at times; wonders in allergies are starting to flare up again-sneezing. Sneezing and watery rhinorrhea which she attributes to "allergy". Dry cough is much improved compared with last visit. She finished her Biaxin 2 weeks ago. Continues prednisone 5 mg daily maintenance, plus her Flovent inhaler and uses rescue inhaler once or twice a day when needed. Sputum culture from 05/16/2012-normal flora. AFB smear negative.  08/09/12- 75 yoF followed for asthma complicated by allergic rhinitis, bronchitis, GERD, hx of atypical AFB. Hx brain tumor/ adrenal insufficiency. PCP Dr [name]  FOLLOWS [id number] any SOB, wheezing, cough, or congestion today. Would like to discuss Dymista(has been using as needed)-noticed it caused dry mouth before. Feels as comfortable with her breathing now if she ever gets. Occasional use of rescue inhaler but no significant cough or wheeze recently.  10/14/12- 3 yoF followed for asthma complicated by allergic rhinitis, bronchitis, GERD, hx of atypical AFB. Hx brain tumor/ adrenal insufficiency. PCP Dr [name]   Husband here ACUTE VISIT: Started feeling bad Wednesday last week; Thursday started wheezing, cough, SOB, fevers of 100's, not wanting to eat-nothing to eat today. She had done too much in yard work for 2 days. For the past 8 or 10 days has felt acutely ill with cough especially bad for the past 2 days. Deep cough, exhausted. Nebulizer does not help. Dr [name] also wanted to see if increasing maintenance prednisone would help her chronic dizziness.  02/09/13- 3 yoF followed for asthma complicated by allergic rhinitis,  bronchitis, GERD, hx of atypical AFB. Hx brain tumor/ adrenal insufficiency. PCP Dr [name]   Husband here FOLLOWS [id number] she has had a few flare ups since last visit(used rescue inhaler maybe 3 times) Had flu vaccine We reviewed her recent hospitalization- salivary stone surgery complicated by bleeding diverticulum. Dymista helps sneeze and rhinorhea.  06/13/13-84 yoF followed for asthma complicated by allergic rhinitis, bronchitis, GERD, hx of atypical AFB. Hx brain tumor/ adrenal insufficiency. PCP Dr [name]   Husband here FOLLOWS FOR: pt is currently having cough-productive-green; no fevers, runny nose and sneezing. Has been going on since Saturday.  06/30/13- 84 yoF followed for asthma complicated by allergic rhinitis, bronchitis, GERD, hx of atypical AFB. Hx brain tumor/ adrenal insufficiency. PCP Dr [name]   Husband here Acute hosp 2/13-2/17/15 for AE asthma/ bronchitis FOLLOWS FOR:  Breathing and cough have improved.  c/o: of  stroke affecting (L) eye and causing dizzy spells Breathing has been much better since hospital, without wheeze now. Using just Flovent without needing her nebulizer. Tapering prednisone towards 5 mg daily maintenance dose. CXR 06/23/13 IMPRESSION:  Hyperexpanded lungs and bronchitic change without acute  cardiopulmonary disease. Specifically, no definite evidence of  pneumonia.  Electronically Signed  By: Sandi Mariscal M.D.  On: 06/23/2013 19:14  10/11/13- 65 yoF followed for asthma complicated by allergic rhinitis, bronchitis, GERD, hx of atypical AFB. Complicated by Hx brain tumor/ adrenal insufficiency, CVA. PCP Dr [name]   Husband here FOLLOWS FOR:Pt states she has been doing well since seen last; had to use her rescue inhaler a few times since 06-2013 visit. Rare need for rescue inhaler but needs refill. Doing a lot of yard work. Continues prednisone maintenance 5 mg daily. Had CVA which left her dizzy and with decreased eyesight.  02/12/14-  25 yoF followed  for asthma complicated by allergic rhinitis, bronchitis, GERD, hx of atypical AFB. Complicated by Hx brain tumor/ adrenal insufficiency, CVA. PCP Dr [name]   Husband here FOLLOW FOR: Constantly dizzy, cough in the mornings  08/13/14- 84 yoF followed for asthma complicated by allergic rhinitis, bronchitis, GERD, hx of atypical AFB. Complicated by Hx brain tumor/ adrenal insufficiency, CVA. PCP Dr [name]   Husband here FOLLOWS FOR: Increased asthma flares, pt went to beach and reports pollen was high and she was around two dogs in that house. Using Albuterol and Flovent as directed. Minor cough and congestion since the pollen came out but Mucinex helps. Allegra controls watery rhinorrhea. Her albuterol and Flovent controlled asthma sufficiently. She has not had a serious flare in months.  02/11/15-85 yoF followed for asthma complicated by allergic rhinitis, bronchitis, GERD, hx of atypical AFB. Complicated by Hx brain tumor/ adrenal insufficiency, CVA. PCP Dr [name]   Husband here  FOLLOWS FOR: pt. states she is doing well. no wheezing. occ. chest pain. no chest tightness. occ. dizzy. prod. cough green in color.  Has moved to [place]. Had flu vaccine. She fell and hit her head with workup including CT chest reviewed below. Has not needed her nebulizer machine in years. Uses rescue inhaler about once a month. No recognized asthma in the last 6 months. Sneeze and watery rhinorrhea in the mornings now. CT chest 12/20/14 images and report reviewed with her IMPRESSION: 1. No evidence of traumatic injury to the chest. 2. Mild bibasilar atelectasis noted. Lungs otherwise clear. 3. Calcification at the mitral valve. 4. 2.1 cm cystic lesion at the proximal body of the pancreas. This is increased in size from 8 mm in 2011. A very slow-growing tumor cannot be excluded. MRCP could be considered for further evaluation, if the patient can hold her breath for the study. Would correlate with pancreatic lab  values, as deemed clinically appropriate. 5. 2.6 cm hypodensity at the periphery of the spleen may reflect remote ischemic injury, as a larger vague area of decreased attenuation was seen in 2012. Nonspecific 8 mm hypodensity at the superior aspect of the spleen. Electronically Signed  By: Garald Balding M.D.  On: 12/20/2014 21:28  08/13/2015-86 yoF followed for asthma complicated by allergic rhinitis, bronchitis, GERD, hx of atypical AFB. Complicated by Hx brain tumor/ adrenal insufficiency, CVA. PCP Dr [name] FOLLOWS FOR: Pt went to [facility] ED last night for asthma attack with URI; unsure of when and how to take prednisone Rx's. Pt states she has had this cough for about 1 week now. Was treated with prednisone 40  mg daily 4 days at discharge. She says she has been exhausted since September, first they moved to [facility], and then husband required heart surgery. Started on prednisone 20 mg daily March 6/Dr. Edwards/GI, for colitis. Current wheezing began despite that. Denies current fever, purulent discharge, chest pain, blood or adenopathy. CXR 08/12/2015 IMPRESSION: Mild hyperinflation. Cardiomegaly. Streaky left basilar atelectasis or early infiltrate. Electronically Signed  By: Lahoma Crocker M.D.  On: 08/12/2015 19:14  ROS-see HPI Constitutional:   No-   weight loss, night sweats, +fevers, chills, fatigue, lassitude. HEENT:   No-  headaches, difficulty swallowing, tooth/dental problems, sore throat,   +, itching, ear ache, +nasal congestion, + nasal drip,  CV:  No-   chest pain, orthopnea, PND, swelling in lower extremities, anasarca, dizziness, palpitations Resp: +shortness of breath with exertion or at rest.              No- productive cough, no- non-productive cough,  No- coughing up of blood.              No-   change in color of mucus.  No- wheezing.   Skin: No-   rash or lesions. GI:  No-   heartburn, indigestion, abdominal pain, nausea, vomiting,  GU:  MS:  No-   joint  pain or swelling.  . Neuro-     +dizzy since ophthalmic artery stroke Psych:  No- change in mood or affect. No depression or anxiety.  No memory loss.  OBJ- Physical Exam General- Alert, Oriented, Affect-appropriate, Distress- none acute.  Skin- rash-none, lesions- none, excoriation- none Lymphadenopathy- none Head- atraumatic            Eyes- Gross vision intact, PERRLA, conjunctivae and secretions clear            Ears- Hearing, canals-normal            Nose- Clear, no-Septal dev, mucus, polyps, erosion, perforation             Throat- Mallampati II , mucosa clear , drainage- none, tonsils- atrophic. Neck- flexible , trachea midline, stridor-none, thyroid nl, carotid no bruit Chest - symmetrical excursion , unlabored           Heart/CV- RRR , no murmur , no gallop  , no rub, nl s1 s2                           - JVD- none , edema- none, stasis changes- none, varices- none           Lung-  Clear, wheeze + coarse, cough + deep , dullness-none, rub- none           Chest wall-  Abd-  Br/ Gen/ Rectal- Not done, not indicated Extrem- cyanosis- none, clubbing, none, atrophy- none, strength- nl Neuro- grossly intact to observation

## 2015-08-13 NOTE — Telephone Encounter (Signed)
Spoke with pharmacist to verify medications-azithromycin and prednisone were both sent in incorrectly.  These have been corrected with the pharmacist.  Nothing further needed.

## 2015-08-14 ENCOUNTER — Telehealth: Payer: Self-pay | Admitting: Internal Medicine

## 2015-08-14 MED ORDER — BENZONATATE 100 MG PO CAPS: 100.0000 mg | ORAL_CAPSULE | Freq: Three times a day (TID) | ORAL | Status: DC

## 2015-08-14 NOTE — Assessment & Plan Note (Addendum)
Acute exacerbation associated with viral pattern acute respiratory infection Plan-nebulizer treatments Xopenex, Depo-Medrol, Z-Pak, prednisone taper returning to current maintenance 20 mg daily per GI

## 2015-08-14 NOTE — Telephone Encounter (Signed)
Spoke with patient-she lost the Rx that she brought in with her from [facility] ED. Pt is aware that I will send Rx through [facility] under [facility]'s name. Nothing more needed at this time.

## 2015-08-14 NOTE — Assessment & Plan Note (Signed)
She is going to call Dr. Oletta Lamas to clarify how long she is supposed to stay on maintenance prednisone 20 mg daily

## 2015-08-14 NOTE — Telephone Encounter (Signed)
Pt states that she was supposed to get Tessalon perles from [facility] at last OV. Advised that these were sent by Deno Etienne, DO 08/12/15 to [facility].  Pt states that she is going to call [facility] to have them get the Rx from [place]. Nothing further needed.

## 2015-08-20 ENCOUNTER — Ambulatory Visit (INDEPENDENT_AMBULATORY_CARE_PROVIDER_SITE_OTHER): Payer: Medicare Other | Admitting: Acute Care

## 2015-08-20 ENCOUNTER — Encounter: Payer: Self-pay | Admitting: Acute Care

## 2015-08-20 ENCOUNTER — Telehealth: Payer: Self-pay | Admitting: Internal Medicine

## 2015-08-20 VITALS — BP 132/68 | HR 101 | Temp 97.8°F | Ht 64.0 in | Wt 124.8 lb

## 2015-08-20 DIAGNOSIS — J4520 Mild intermittent asthma, uncomplicated: Secondary | ICD-10-CM

## 2015-08-20 MED ORDER — AMOXICILLIN-POT CLAVULANATE 875-125 MG PO TABS: 1.0000 | ORAL_TABLET | Freq: Two times a day (BID) | ORAL | Status: DC

## 2015-08-20 MED ORDER — HYDROCODONE-HOMATROPINE 5-1.5 MG/5ML PO SYRP: 5.0000 mL | ORAL_SOLUTION | Freq: Four times a day (QID) | ORAL | Status: DC | PRN

## 2015-08-20 MED ORDER — ALBUTEROL SULFATE (2.5 MG/3ML) 0.083% IN NEBU: 2.5000 mg | INHALATION_SOLUTION | Freq: Four times a day (QID) | RESPIRATORY_TRACT | Status: DC | PRN

## 2015-08-20 NOTE — Assessment & Plan Note (Addendum)
Slow to resolve bronchitis Plan: We treated you with a neb treatment today in the office. Augmentin 875 mg every 12 hours for 7 days Increase your prednisone to 20 mg a day  Continue on 20 mg daily until your wheezing/ cough is improved. Then wean back to your 5 mg dose. Decrease by 5 mg each 3 days until you get back to the 5 mg maintenance dose. Hydromet cough syrup one teaspoon every 6 hours as needed for cough. Don't drive if sleepy Albuterol Nebulizer treatments every 4 hours as needed. We will phone in the prescription. Use sugar free hard candies to soothe your throat. Use sips of water instead of throat clearing. Follow up in 2 weeks. Please come in sooner if you are not improving or are any worse. Please contact office for sooner follow up if symptoms do not improve or worsen or seek emergency care

## 2015-08-20 NOTE — Progress Notes (Signed)
Subjective:    [name], female    DOB: 04-Apr-1929, 82 y.o.   MRN: [id number]  HPI  82  Yo female followed for asthma complicated by allergic rhinitis, bronchitis, GERD, hx of atypical AFB, complicated by history of brain tumor/adrenal insufficiency, CVA,. Allergy vaccine- Dr Donneta Romberg .Followed by Dr. Annamaria Boots for asthma.PCP [name]  Tests:  CXR 08/12/2015 IMPRESSION: Mild hyperinflation. Cardiomegaly. Streaky left basilar atelectasis or early infiltrate.  08/12/2015: [facility] ED Visit for Acute Asthma Attack with URI  08/20/2015: Acute Office Visit: Patient  presents to the office today with unresolved cough/ wheezing and shortness of breath. Patient was seen at  the office 08/14/2015 after having been seen in the [facility] ED for Asthma/ URI. She was treated at that time with DepoMedrol injection, prednisone taper, Z-Pack, and Xopenex nebulizer treatment.She returns today with continued yellow secretions, and wheezing with cough that has been keeping her awake at night.She looks exhausted.She has a nebulizer machine, but is out of albuterol treatments.She denies fever, chest pain, orthopnea, hemoptysis,leg swelling, calf or leg pain. No recent auto or airline travel.She was wheezing and coughing so we gave her an in office xopenex treatment.Of note, Dr. Oletta Lamas ,her gastroenterologist, is actively weaning her from a 20 mg prednisone maintenance dose to a 5 mg prednisone  maintenance dose which she has been on for colitis. I discussed this case with Dr. Annamaria Boots, as he knows the patient well.   Review of Systems    Constitutional:   No  weight loss, night sweats,  Fevers, chills, + fatigue, or  lassitude.  HEENT:   No headaches,  Difficulty swallowing,  Tooth/dental problems, or  Sore throat,                No sneezing, itching, ear ache, nasal congestion, post nasal drip,   CV:  No chest pain,  Orthopnea, PND, swelling in lower extremities, anasarca, dizziness, palpitations, syncope.   GI   No heartburn, indigestion, abdominal pain, nausea, vomiting, diarrhea, change in bowel habits, loss of appetite, bloody stools.   Resp: + shortness of breath with exertion not  at rest.  + excess mucus, + productive cough,  No non-productive cough,  No coughing up of blood.  + change in color of mucus.  + wheezing.  No chest wall deformity  Skin: no rash or lesions.  GU: no dysuria, change in color of urine, no urgency or frequency.  No flank pain, no hematuria   MS:  No joint pain or swelling.  No decreased range of motion.  No back pain.  Psych:  No change in mood or affect. No depression or anxiety.  No memory loss.     Objective:   Physical Exam  BP 132/68 mmHg  Pulse 101  Temp(Src) 97.8 F (36.6 C) (Oral)  Ht 5\' 4"  (1.626 m)  Wt 124 lb 12.8 oz (56.609 kg)  BMI 21.41 kg/m2  SpO2 96%   Physical Exam:  General- No distress,  A&Ox3, coughing  ENT: No sinus tenderness, TM clear, pale nasal mucosa, no oral exudate,no post nasal drip, no LAN Cardiac: S1, S2, regular rate and rhythm, no murmur Chest: + wheeze/ no rales/ dullness, rhonchi throughout,; no accessory muscle use, no nasal flaring, no sternal retractions Abd.: Soft Non-tender Ext: No clubbing cyanosis, edema Neuro:  normal strength Skin: No rashes, warm and dry Psych: normal mood and behavior  Magdalen Spatz, AGACNP-BC [place] Pager # [phone] 08/20/2015     Assessment &  Plan:

## 2015-08-20 NOTE — Telephone Encounter (Signed)
Spoke with pt, states her cough has not improved since last ov, requesting appt this afternoon.  Pt scheduled with SG.  Nothing further needed.

## 2015-08-20 NOTE — Patient Instructions (Addendum)
We treated you with a neb treatment today in the office. Augmentin 875 mg every 12 hours for 7 days Increase your prednisone to 20 mg a day  Continue on 20 mg daily until your wheezing/ cough is improved. Then wean back to your 5 mg dose. Decrease by 5 mg each 3 days until you get back to the 5 mg maintenance dose. Hydromet cough syrup one teaspoon every 6 hours as needed for cough. Don't drive if sleepy Nebulizer treatments every 4 hours as needed. We will phone in the prescription for albuterol nebulizer treatments Use sugar free hard candies to soothe your throat. Use sips of water instead of throat clearing. Follow up in 2 weeks. Please come in sooner if you are not improving or are any worse. Please contact office for sooner follow up if symptoms do not improve or worsen or seek emergency care

## 2015-08-22 ENCOUNTER — Telehealth: Payer: Self-pay | Admitting: Acute Care

## 2015-08-22 MED ORDER — CEFDINIR 300 MG PO CAPS: 300.0000 mg | ORAL_CAPSULE | Freq: Two times a day (BID) | ORAL | Status: DC

## 2015-08-22 NOTE — Telephone Encounter (Signed)
Spoke with patient - aware of rec's Pt to try Mucinex again and has a flutter valve at home that she is going to try using again. Nothing further needed.

## 2015-08-22 NOTE — Telephone Encounter (Signed)
lmtcb for pt.  

## 2015-08-22 NOTE — Telephone Encounter (Signed)
Pt aware of rec's per CY Pt states that she does not feel she needs an abx at this time but told us to call it in just in case she needs it over the weekend.  Omnicef 300mg  called into pharmacy per pt request.  Pt also asking for rec's regarding her chest rattle.  Pt states that she is unable to sleep s/t her chest rattling and wheezing.  Pt states that she is using her Nebs as directed and no more than directed (about every 6 hours).  Please advise. Thanks.

## 2015-08-22 NOTE — Telephone Encounter (Signed)
Pt returning call.Sierra [name] ° °

## 2015-08-22 NOTE — Telephone Encounter (Signed)
Pt was seen by SG 4/11 and given Augmentin. Spoke with pt and she states that she has taken 2 days/4 doses of Augmentin and it has given her severe diarrhea. Pt also c/o continued cough and wheeze even with neb treatments. Pt would like different abx or other treatment recommendations.  CY please advise.   Allergies  Allergen Reactions  . Biaxin [Clarithromycin]     Bitter taste  . Ciprofloxacin Hcl   . Lactose Intolerance (Gi)     gas  . Sulfa Antibiotics   . Ciprofloxacin Rash  . Codeine Nausea And Vomiting  . Dilantin [Phenytoin] Rash  . Doxycycline Nausea Only    Very nauseated  . Restasis [Cyclosporine] Other (See Comments)    Eyes burn   . Sulfonamide Derivatives Rash   Current Outpatient Prescriptions on File Prior to Visit  Medication Sig Dispense Refill  . albuterol (PROVENTIL HFA;VENTOLIN HFA) 108 (90 BASE) MCG/ACT inhaler Inhale 2 puffs into the lungs every 6 (six) hours as needed for wheezing or shortness of breath. 1 Inhaler prn  . albuterol (PROVENTIL HFA;VENTOLIN HFA) 108 (90 BASE) MCG/ACT inhaler Inhale 2 puffs into the lungs every 6 (six) hours as needed for wheezing or shortness of breath. 1 Inhaler prn  . albuterol (PROVENTIL) (2.5 MG/3ML) 0.083% nebulizer solution Take 3 mLs (2.5 mg total) by nebulization every 6 (six) hours as needed for wheezing or shortness of breath. DX 493.90 360 mL 12  . Alpha-D-Galactosidase (BEANO) TABS Take 2 tablets by mouth daily as needed (gas).    Marland Kitchen amLODipine (NORVASC) 2.5 MG tablet Take 2.5 mg by mouth daily after breakfast.    . amoxicillin-clavulanate (AUGMENTIN) 875-125 MG tablet Take 1 tablet by mouth 2 (two) times daily. 14 tablet 0  . Artificial Tear Solution (BION TEARS) 0.1-0.3 % SOLN Place 1 drop into both eyes 2 (two) times daily as needed (dry eyes).     . B Complex-C (B-COMPLEX WITH VITAMIN C) tablet Take 1 tablet by mouth daily.    . benzonatate (TESSALON) 100 MG capsule Take 1 capsule (100 mg total) by mouth every 8  (eight) hours. 21 capsule 0  . cholecalciferol (VITAMIN D) 1000 UNITS tablet Take 1,000 Units by mouth daily.    . famotidine (PEPCID AC) 10 MG chewable tablet Chew 10 mg by mouth daily.    . fluticasone (FLOVENT HFA) 110 MCG/ACT inhaler Inhale 2 puffs into the lungs 2 (two) times daily.    Marland Kitchen HYDROcodone-homatropine (HYCODAN) 5-1.5 MG/5ML syrup Take 5 mLs by mouth every 6 (six) hours as needed for cough. 180 mL 0  . lactase (LACTAID) 3000 UNITS tablet Take 3,000 Units by mouth daily as needed (lactose intolerant).    Marland Kitchen levothyroxine (SYNTHROID, LEVOTHROID) 75 MCG tablet Take 75 mcg by mouth daily before breakfast.     . LIALDA 1.2 g EC tablet Take 2 tablets by mouth daily.    Marland Kitchen LORazepam (ATIVAN) 0.5 MG tablet Take 0.5 mg by mouth at bedtime as needed for sleep.     . Multiple Vitamin (MULTIVITAMIN) tablet Take 1 tablet by mouth daily.    . predniSONE (DELTASONE) 10 MG tablet Take 1 tablet (10 mg total) by mouth daily with breakfast. 20 tablet 0  . simethicone (MYLICON) 80 MG chewable tablet Chew 80 mg by mouth every 6 (six) hours as needed for flatulence (gas).    . vitamin B-12 (CYANOCOBALAMIN) 250 MCG tablet Take 250 mcg by mouth daily.    . [DISCONTINUED] Alum & Mag Hydroxide-Simeth (MAGIC MOUTHWASH)  SOLN Take 5 mLs by mouth 3 (three) times daily as needed. Thrush      No current facility-administered medications on file prior to visit.

## 2015-08-22 NOTE — Telephone Encounter (Signed)
Mucinex might help clear the rattle.  Also we could try getting her a Flutter Device

## 2015-08-22 NOTE — Telephone Encounter (Signed)
Stop aumentin  It may help to take a probiotic for her stomach.   If she feels she still needs and antibiotic, then ok to offer omniceff. 300 mg, # 14, 1 twice daily

## 2015-08-26 ENCOUNTER — Telehealth: Payer: Self-pay | Admitting: Pulmonary Disease

## 2015-08-26 NOTE — Telephone Encounter (Signed)
LMTCB  Per SG's LOV note: Increase your prednisone to 20 mg a day  Continue on 20 mg daily until your wheezing/ cough is improved. Then wean back to your 5 mg dose. Decrease by 5 mg each 3 days until you get back to the 5 mg maintenance dose.

## 2015-08-26 NOTE — Telephone Encounter (Signed)
Spoke with pt and she states that she still has a "deep" cough that is worse when she lays down. Pt is sleeping on 2 pillows but still uncomfortable. Advised pt that per SG's rec's at [place] she should continue prednisone increase until her cough improves, however since it is not continuing to improve advised pt to call us on Wednesday if not better. Pt voiced understanding.

## 2015-08-26 NOTE — Telephone Encounter (Signed)
Pt is returning call. She was at lunch. Please try to call her back again. [phone]

## 2015-08-28 ENCOUNTER — Telehealth: Payer: Self-pay | Admitting: Acute Care

## 2015-08-28 NOTE — Telephone Encounter (Signed)
Spoke with pt, moved appt to Friday.  Nothing further needed.

## 2015-08-28 NOTE — Telephone Encounter (Signed)
Sending message to VS as CY is unavailable this afternoon.  Please advise, thanks!

## 2015-08-28 NOTE — Telephone Encounter (Signed)
If her symptoms are not improving, then she needs to come in for earlier evaluation.

## 2015-08-28 NOTE — Telephone Encounter (Signed)
Spoke with pt, states she cannot get rid of deep "rattle" in chest when coughing, sometimes prod cough after neb tx with foamy clear mucus.  Pt still taking prednisone, mucinex, and neb tx's, wants to know if there is anything further that can be recommended between now and 4/25 appt with SG.    Pt uses [facility] please advise on further recs.  Thanks!

## 2015-08-29 IMAGING — CR DG CHEST 2V
2 series · 2 of 2 positions shown · non-contrast
Comparison: DG CHEST 2 VIEW dated 03/14/2012; DG CHEST 2 VIEW dated
07/15/2011

CLINICAL DATA: Wheezing and cough, evaluate for pneumonia

EXAM:
CHEST  2 VIEW

[w chest pa]
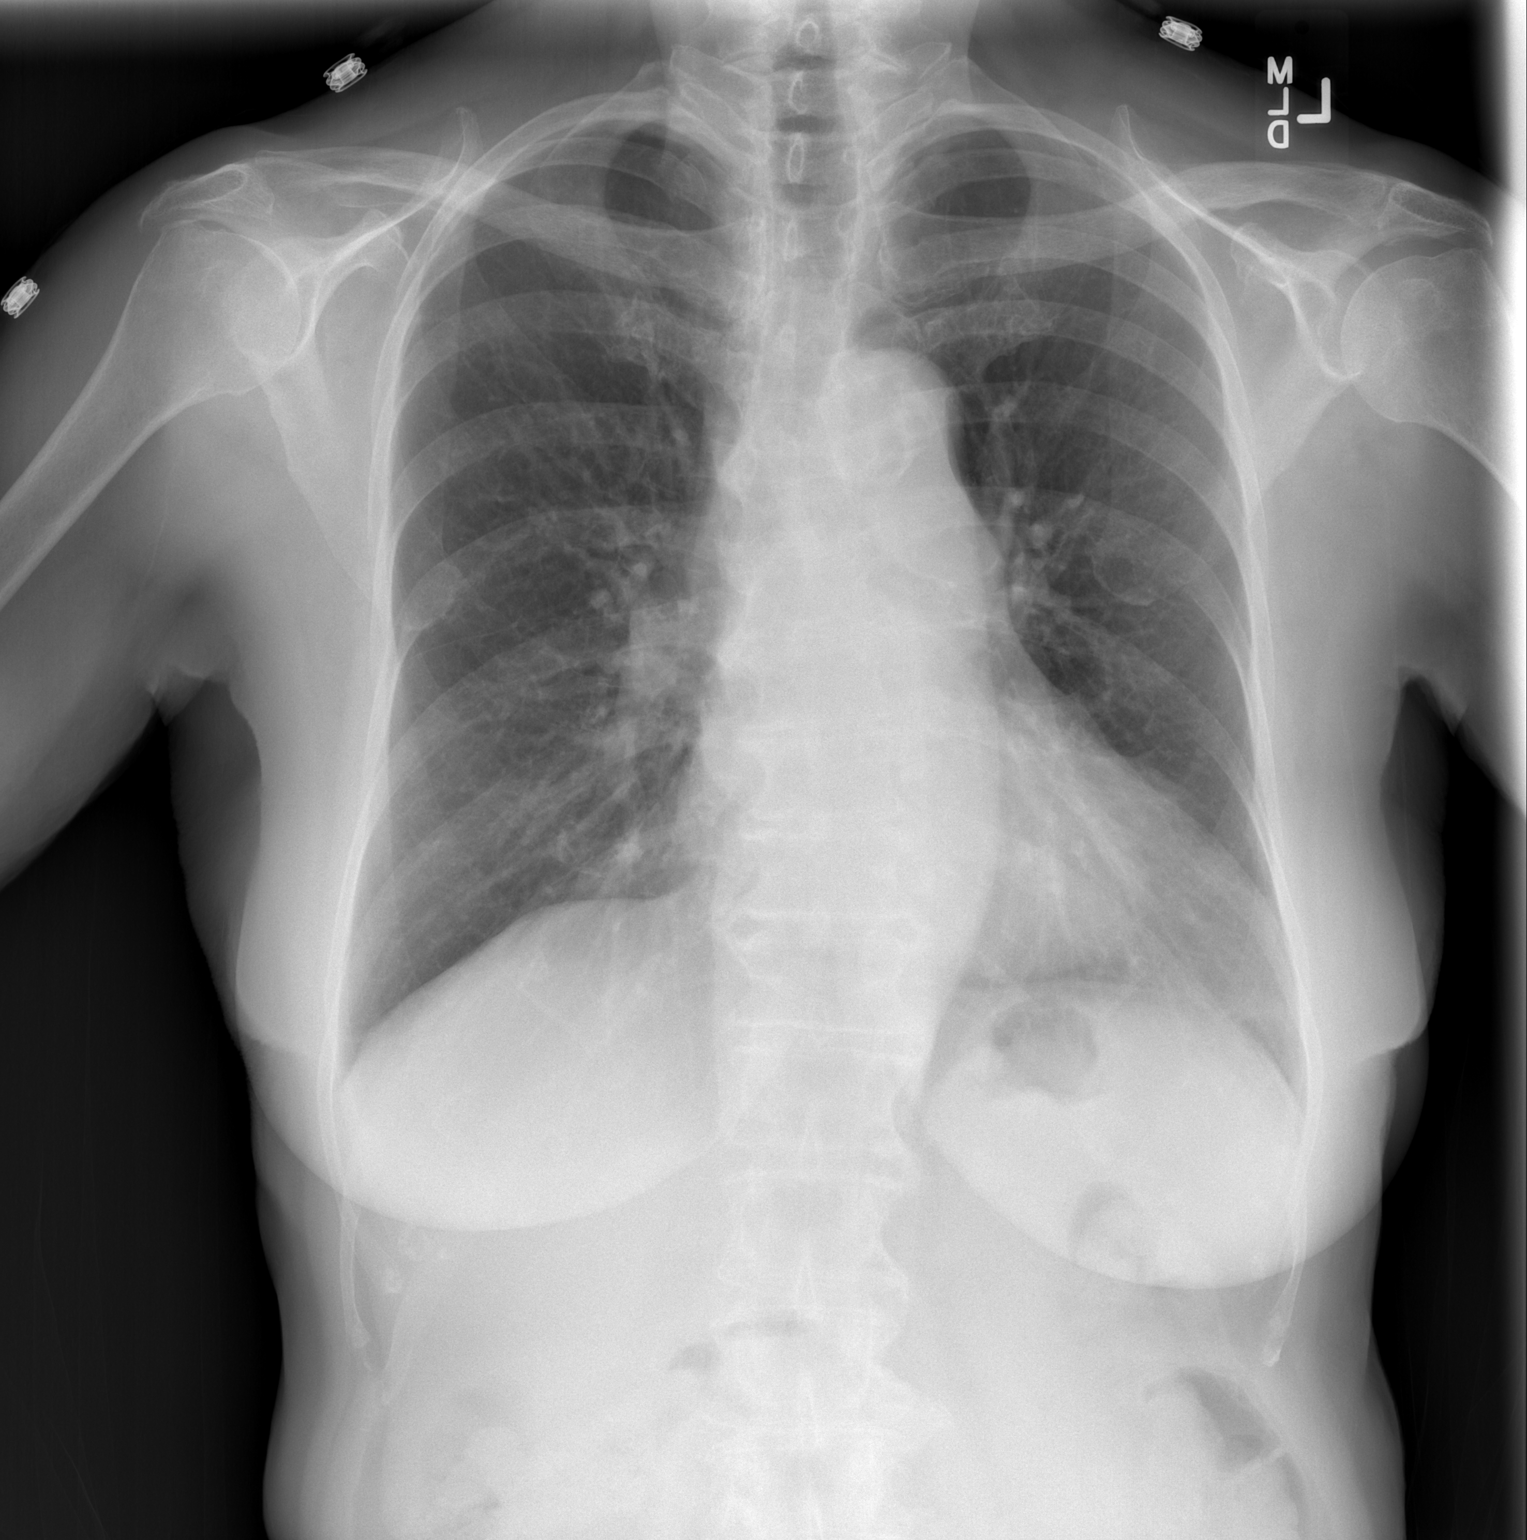

[w chest lat]
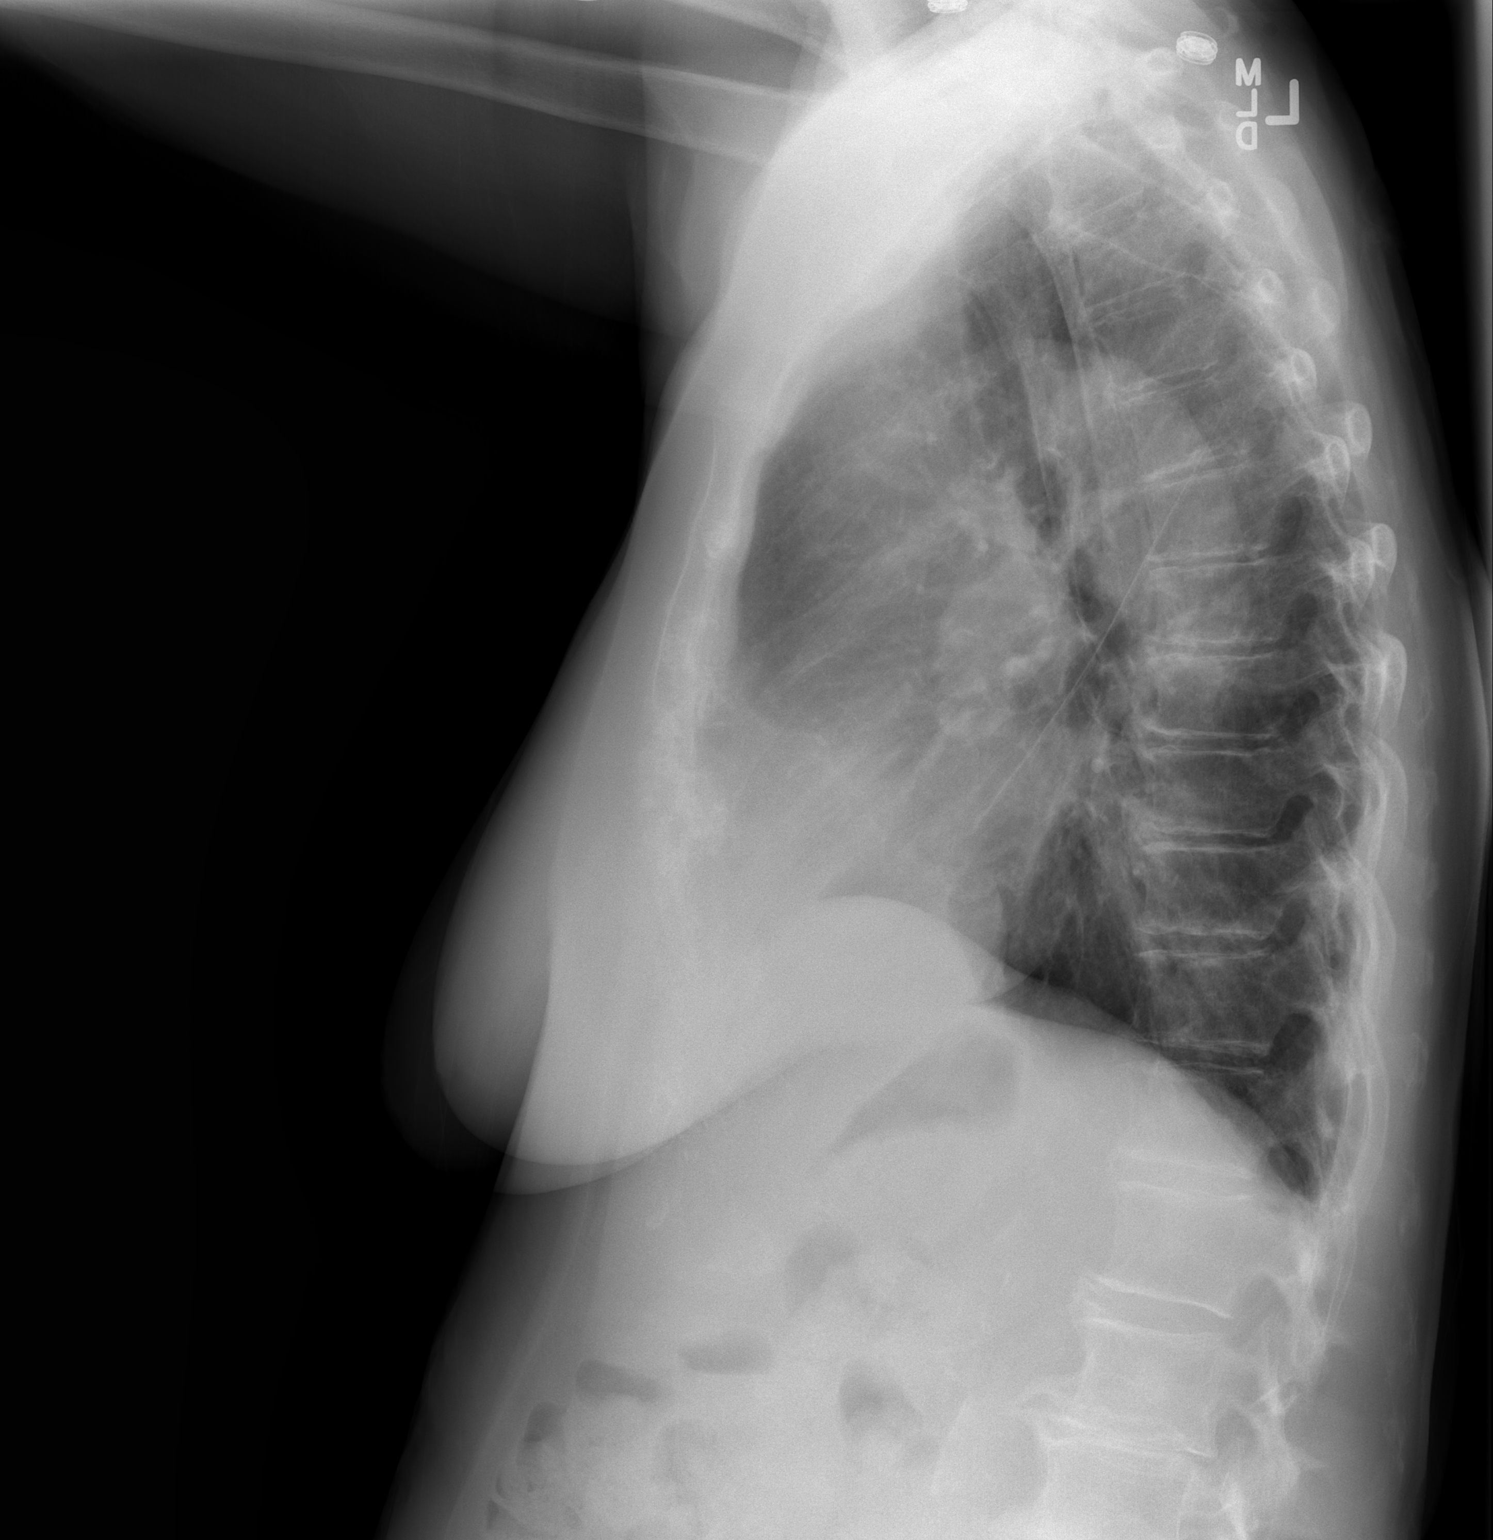

[2 of 2 positions shown; findings below may reference images not displayed]

FINDINGS: Grossly unchanged cardiac silhouette and mediastinal contours with
atherosclerotic plaque within the thoracic aorta. The thoracic aorta
appears mildly tortuous. The lungs appear hyperexpanded with
flattening of bilateral knee diaphragms a mild diffuse slightly
nodular thickening of the pulmonary interstitium. There is mild
eventration/ elevation and medial aspect of the right hemidiaphragm.
No discrete focal airspace opacities. No pleural effusion or
pneumothorax. No definite evidence of edema. Progressive DDD within
the upper lumbar spine.
IMPRESSION: Hyperexpanded lungs and bronchitic change without acute
cardiopulmonary disease. Specifically, no definite evidence of
pneumonia.

## 2015-08-30 ENCOUNTER — Other Ambulatory Visit (INDEPENDENT_AMBULATORY_CARE_PROVIDER_SITE_OTHER): Payer: Medicare Other

## 2015-08-30 ENCOUNTER — Encounter: Payer: Self-pay | Admitting: Acute Care

## 2015-08-30 ENCOUNTER — Ambulatory Visit (INDEPENDENT_AMBULATORY_CARE_PROVIDER_SITE_OTHER)
Admission: RE | Admit: 2015-08-30 | Discharge: 2015-08-30 | Disposition: A | Discharge status: Home / Self Care | Payer: Medicare Other | Source: Ambulatory Visit | Attending: Acute Care | Admitting: Acute Care

## 2015-08-30 ENCOUNTER — Ambulatory Visit (INDEPENDENT_AMBULATORY_CARE_PROVIDER_SITE_OTHER): Payer: Medicare Other | Admitting: Acute Care

## 2015-08-30 VITALS — BP 139/70 | HR 113 | Ht 63.5 in | Wt 126.0 lb

## 2015-08-30 DIAGNOSIS — J4520 Mild intermittent asthma, uncomplicated: Secondary | ICD-10-CM | POA: Diagnosis not present

## 2015-08-30 DIAGNOSIS — J209 Acute bronchitis, unspecified: Secondary | ICD-10-CM

## 2015-08-30 LAB — BASIC METABOLIC PANEL
BUN: 20 mg/dL (ref 6–23)
CHLORIDE: 97 meq/L (ref 96–112)
CO2: 28 mEq/L (ref 19–32)
Calcium: 10.2 mg/dL (ref 8.4–10.5)
Creatinine, Ser: 0.92 mg/dL (ref 0.40–1.20)
GFR: 61.74 mL/min (ref >60.00)
Glucose, Bld: 229 mg/dL — ABNORMAL HIGH (ref 70–99)
POTASSIUM: 4.7 meq/L (ref 3.5–5.1)
Sodium: 136 mEq/L (ref 135–145)

## 2015-08-30 LAB — CBC WITH DIFFERENTIAL/PLATELET
BASOS ABS: 0.1 10*3/uL (ref 0.0–0.1)
Basophils Relative: 0.4 % (ref 0.0–3.0)
EOS ABS: 0 10*3/uL (ref 0.0–0.7)
Eosinophils Relative: 0 % (ref 0.0–5.0)
HEMATOCRIT: 41.3 % (ref 36.0–46.0)
Hemoglobin: 13.8 g/dL (ref 12.0–15.0)
LYMPHS PCT: 4.2 % — AB (ref 12.0–46.0)
Lymphs Abs: 0.7 10*3/uL (ref 0.7–4.0)
MCHC: 33.5 g/dL (ref 30.0–36.0)
MCV: 84.6 fl (ref 78.0–100.0)
Monocytes Absolute: 0.5 10*3/uL (ref 0.1–1.0)
Monocytes Relative: 3.1 % (ref 3.0–12.0)
NEUTROS ABS: 15.4 10*3/uL — AB (ref 1.4–7.7)
NEUTROS PCT: 92.3 % — AB (ref 43.0–77.0)
PLATELETS: 324 10*3/uL (ref 150.0–400.0)
RBC: 4.88 Mil/uL (ref 3.87–5.11)
RDW: 15.7 % — ABNORMAL HIGH (ref 11.5–15.5)
WBC: 16.6 10*3/uL — ABNORMAL HIGH (ref 4.0–10.5)

## 2015-08-30 MED ORDER — LEVALBUTEROL HCL 0.63 MG/3ML IN NEBU
0.6300 mg | INHALATION_SOLUTION | Freq: Once | RESPIRATORY_TRACT | Status: AC
  Administered 2015-08-30: 0.63 mg via RESPIRATORY_TRACT

## 2015-08-30 NOTE — Patient Instructions (Addendum)
It is nice to see you again today. CXR today. We will call with results. Please start the Omnicef 300 mg twice daily as prescribed on 08/22/15. Take probiotic while on antibiotics.( You can eat yogurt with probiotic or purchase the tablets over the counter.) It is important that you take this medication. Continue using the Mucinex every 12 hours. You can try Robitussin cough syrup for cough. Use Flutter valve We will check labs today( CBC with diff / BMET ) Neb treatment while here today Check with DME company about new Neb machine Wean back to your 5 mg dose of prednisone. Decrease by 5 mg each 3 days until you get back to the 5 mg maintenance dose Call if you feel you are getting worse instead of better. Follow up in 1 week. Please contact office for sooner follow up if symptoms do not improve or worsen or seek emergency care

## 2015-08-30 NOTE — Assessment & Plan Note (Signed)
Continued SOB, Wheezing, productive cough with green sputum Denies fever/ Chest pain Did not complete ABX/ Augmentin due to diarrhea Filled but did not take Omnicef that was prescribed in place of Augmentin Continued early pneumonia per CXR today  Plan: CXR today. We will call with results. Please start the Omnicef 300 mg twice daily as prescribed on 08/22/15. Take probiotic while on antibiotics.( You can eat yogurt with probiotic or purchase the tablets over the counter.) It is important that you take this medication. Continue using the Mucinex every 12 hours. You can try Robitussin cough syrup for cough. Use Flutter valve We will check labs today( CBC with diff / BMET ) Neb treatment while here today Check with DME company about new Neb machine Wean back to your 5 mg dose of prednisone. Decrease by 5 mg each 3 days until you get back to the 5 mg maintenance dose Call if you feel you are getting worse instead of better. Follow up with Dr. Annamaria Boots or myself in 1 week. Please contact office for sooner follow up if symptoms do not improve or worsen or seek emergency care

## 2015-08-30 NOTE — Progress Notes (Signed)
Subjective:    [name], female    DOB: Sep 07, 1928, 82 y.o.   MRN: [id number]  HPI 82 Yo female followed for asthma complicated by allergic rhinitis, bronchitis, GERD, hx of atypical AFB, complicated by history of brain tumor/adrenal insufficiency, CVA,. Allergy vaccine- Dr Donneta Romberg .Followed by Dr. Annamaria Boots for asthma.PCP [name]  Significant Events/Procedures: 08/12/2015: [facility] Visit for Acute Asthma Attack with URI/ Depomedrol injection, prednisone taper, Z-pack  CXR 08/12/2015 IMPRESSION: Mild hyperinflation. Cardiomegaly. Streaky left basilar atelectasis or early infiltrate.  08/20/2015: Acute Office Visit:cough/ green mucus/fatigue/ increased SOB Treatment included: Augmentin 875 mg Q 12 hours x 7 days Increase prednisone to 20 mg daily until wheezing/cough improve.( Chronic prednisone at 5 mg daily) Neb treatments Q 6 hours.  08/22/2015: Called the office: Augmentin caused diarrhea Omnicef 300 mg twice daily called in to Pharmacy She filled this prescription but did not take it.  08/30/2015: CXR IMPRESSION: Atelectasis or early pneumonia in the anterior aspect of the left lower lobe. Underlying reactive airway disease.  08/30/2015 Acute Office Visit: Pt. Presents to the office with history of continued cough,and rattling in chest.Cough is productive with green mucus and she continues to wheeze despite increase in prednisone dose.She continues to use her nebulized treatments every 6 hours. Upon further discussion it was determined that she stopped the Augmentin, but did not start taking the [facility] that was prescribed.She appears somewhat confused about why she did not take the medicine.She denies fever, chest pain, she is sleeping with extra pillows, denies hemoptysis, leg or calf pain.She states that her appetite is good.   Current outpatient prescriptions:  .  albuterol (PROVENTIL HFA;VENTOLIN HFA) 108 (90 BASE) MCG/ACT inhaler, Inhale 2 puffs into the lungs  every 6 (six) hours as needed for wheezing or shortness of breath., Disp: 1 Inhaler, Rfl: prn .  albuterol (PROVENTIL) (2.5 MG/3ML) 0.083% nebulizer solution, Take 3 mLs (2.5 mg total) by nebulization every 6 (six) hours as needed for wheezing or shortness of breath. DX 493.90, Disp: 360 mL, Rfl: 12 .  Alpha-D-Galactosidase (BEANO) TABS, Take 2 tablets by mouth daily as needed (gas)., Disp: , Rfl:  .  amLODipine (NORVASC) 2.5 MG tablet, Take 2.5 mg by mouth daily after breakfast., Disp: , Rfl:  .  Artificial Tear Solution (BION TEARS) 0.1-0.3 % SOLN, Place 1 drop into both eyes 2 (two) times daily as needed (dry eyes). , Disp: , Rfl:  .  B Complex-C (B-COMPLEX WITH VITAMIN C) tablet, Take 1 tablet by mouth daily., Disp: , Rfl:  .  cefdinir (OMNICEF) 300 MG capsule, Take 1 capsule (300 mg total) by mouth 2 (two) times daily., Disp: 14 capsule, Rfl: 0 .  cholecalciferol (VITAMIN D) 1000 UNITS tablet, Take 1,000 Units by mouth daily., Disp: , Rfl:  .  famotidine (PEPCID AC) 10 MG chewable tablet, Chew 10 mg by mouth daily., Disp: , Rfl:  .  fluticasone (FLOVENT HFA) 110 MCG/ACT inhaler, Inhale 2 puffs into the lungs 2 (two) times daily., Disp: , Rfl:  .  lactase (LACTAID) 3000 UNITS tablet, Take 3,000 Units by mouth daily as needed (lactose intolerant)., Disp: , Rfl:  .  levothyroxine (SYNTHROID, LEVOTHROID) 75 MCG tablet, Take 75 mcg by mouth daily before breakfast. , Disp: , Rfl:  .  LIALDA 1.2 g EC tablet, Take 2 tablets by mouth daily., Disp: , Rfl:  .  LORazepam (ATIVAN) 0.5 MG tablet, Take 0.5 mg by mouth at bedtime as needed for sleep. , Disp: , Rfl:  .  Multiple Vitamin (MULTIVITAMIN) tablet, Take 1 tablet by mouth daily., Disp: , Rfl:  .  predniSONE (DELTASONE) 10 MG tablet, Take 1 tablet (10 mg total) by mouth daily with breakfast. (Patient taking differently: Take 20 mg by mouth daily with breakfast. ), Disp: 20 tablet, Rfl: 0 .  simethicone (MYLICON) 80 MG chewable tablet, Chew 80 mg by  mouth every 6 (six) hours as needed for flatulence (gas)., Disp: , Rfl:  .  vitamin B-12 (CYANOCOBALAMIN) 250 MCG tablet, Take 250 mcg by mouth daily., Disp: , Rfl:  .  [DISCONTINUED] Alum & Mag Hydroxide-Simeth (MAGIC MOUTHWASH) SOLN, Take 5 mLs by mouth 3 (three) times daily as needed. Thrush , Disp: , Rfl:    Past Medical History  Diagnosis Date  . Ulcerative colitis   . Hypertension   . Hyperlipemia   . Diverticulitis, colon   . Other specified iron deficiency anemias   . Thrombocytosis ([place])   . Leukocytosis   . Esophageal reflux   . Chronic pharyngitis   . Allergic rhinitis   . Acute asthmatic bronchitis   . Hypothyroidism   . Cardiac murmur     DOES NOT CAUSE ANY SYMPTOMS  . Recurrent upper respiratory infection (URI)     ON 07/10/11 PT SAW DR. Tarri Fuller [facility] FOR TX OF ACUTE BRONCHITIS --GIVEN  EXTRA PREDNISONE ( IN ADDITION TO THE DAILY PREDNISONE PT TAKES)  AND PT FINISHED A Z-PAK--STILL HAS A COUGH TODAY 07/15/11-NO FEVER.  Marland Kitchen Blood transfusion   . Arthritis     HX OF RT SHOULDER BURSITIS-AND THE SHOULDER IS HURTING AT PRESENT, PAIN IN LEFT KNEE AT PRESENT  AND PAIN IN LEFT HIP  . IBS (irritable bowel syndrome)   . Balance problem     SINCE BRAIN TUMOR REMOVED IN 2002-BENIGN TUMOR-PT HAS ADRENAL INSUFFICIENCY AND TAKES DAILY PREDNISONE  . GI bleed 01/06/2013    Allergies  Allergen Reactions  . Biaxin [Clarithromycin]     Bitter taste  . Ciprofloxacin Hcl   . Lactose Intolerance (Gi)     gas  . Sulfa Antibiotics   . Ciprofloxacin Rash  . Codeine Nausea And Vomiting  . Dilantin [Phenytoin] Rash  . Doxycycline Nausea Only    Very nauseated  . Restasis [Cyclosporine] Other (See Comments)    Eyes burn   . Sulfonamide Derivatives Rash    Review of Systems   Constitutional:   No  weight loss, night sweats,  Fevers, chills, +fatigue, or  lassitude.  HEENT:   No headaches,  Difficulty swallowing,  Tooth/dental problems, or  Sore throat,                No sneezing,  itching, ear ache, nasal congestion, post nasal drip,   CV:  No chest pain,  +Orthopnea, No PND, swelling in lower extremities, anasarca, dizziness, palpitations, syncope.   GI  No heartburn, indigestion, abdominal pain, nausea, vomiting, diarrhea, change in bowel habits, loss of appetite, bloody stools.   Resp: + shortness of breath with exertion or at rest.  + excess mucus, + productive cough,  No non-productive cough,  No coughing up of blood.  + change in color of mucus.  + wheezing.  No chest wall deformity  Skin: no rash or lesions.  GU: no dysuria, change in color of urine, no urgency or frequency.  No flank pain, no hematuria   MS:  No joint pain or swelling.  No decreased range of motion.  No back pain.  Psych:  No change in mood  or affect. No depression or anxiety.  No memory loss.     Objective:   Physical Exam  BP 139/70 mmHg  Pulse 113  Ht 5' 3.5" (1.613 m)  Wt 126 lb (57.153 kg)  BMI 21.97 kg/m2  SpO2 93%  Physical Exam:  General- No distress,  A&Ox3, very pleasant disposition ENT: No sinus tenderness, TM clear, pale nasal mucosa, no oral exudate,no post nasal drip, no LAN Cardiac: S1, S2, regular rate and rhythm, no murmur Chest: few exp. Wheeze/no  rales/ dullness; no accessory muscle use, no nasal flaring, no sternal retractions Abd.: Soft Non-tender Ext: No clubbing cyanosis, edema Neuro:  normal strength Skin: No rashes, warm and dry Psych: normal mood and behavior Magdalen Spatz, AGACNP-BC [facility] 08/30/2015    Assessment & Plan:

## 2015-09-03 ENCOUNTER — Ambulatory Visit: Payer: Medicare Other | Admitting: Acute Care

## 2015-09-09 ENCOUNTER — Encounter: Payer: Self-pay | Admitting: Acute Care

## 2015-09-09 ENCOUNTER — Ambulatory Visit (INDEPENDENT_AMBULATORY_CARE_PROVIDER_SITE_OTHER): Payer: Medicare Other | Admitting: Acute Care

## 2015-09-09 VITALS — BP 138/74 | HR 83 | Ht 63.5 in | Wt 128.4 lb

## 2015-09-09 DIAGNOSIS — J4520 Mild intermittent asthma, uncomplicated: Secondary | ICD-10-CM

## 2015-09-09 NOTE — Patient Instructions (Addendum)
I am so glad you are feeling better today. Continue your Flovent and Allegra for allergy control. Follow up with Dr. Annamaria Boots  August 10th at 2 pm as is already scheduled. Please contact office for sooner follow up if symptoms do not improve or worsen or seek emergency care

## 2015-09-09 NOTE — Progress Notes (Signed)
   Subjective:    [name], female    DOB: 1928/08/13, 82 y.o.   MRN: [id number]  HPI   82 Yo female followed for asthma complicated by allergic rhinitis, bronchitis, GERD, hx of atypical AFB, complicated by history of brain tumor/adrenal insufficiency, CVA,. Allergy vaccine- Dr Donneta Romberg .Followed by Dr. Annamaria Boots for asthma.PCP [name]  Significant Events/Procedures: 08/12/2015: [facility] Visit for Acute Asthma Attack with URI/ Depomedrol injection, prednisone taper, Z-pack  09/09/2015   Follow up OV: Patient presents to the office today Doing 100% better. Back to baseline. Rare cough, No wheezing, No purulent sputum ,No fever.She states she is finally better. She denies chest pain, orthopnea, hemoptysis, leg or calf pain.  Review of Systems .Constitutional:   No  weight loss, night sweats,  Fevers, chills, fatigue, or  lassitude.  HEENT:   No headaches,  Difficulty swallowing,  Tooth/dental problems, or  Sore throat,                No sneezing, itching, ear ache, nasal congestion, post nasal drip,   CV:  No chest pain,  Orthopnea, PND, swelling in lower extremities, anasarca, dizziness, palpitations, syncope.   GI  No heartburn, indigestion, abdominal pain, nausea, vomiting, diarrhea, change in bowel habits, loss of appetite, bloody stools.   Resp: No shortness of breath with exertion or at rest.  No excess mucus, no productive cough,  No non-productive cough,  No coughing up of blood.  No change in color of mucus.  No wheezing.  No chest wall deformity  Skin: no rash or lesions.  GU: no dysuria, change in color of urine, no urgency or frequency.  No flank pain, no hematuria   MS:  No joint pain or swelling.  No decreased range of motion.  No back pain.  Psych:  No change in mood or affect. No depression or anxiety.  No memory loss.        Objective:   Physical Exam   BP 138/74 mmHg  Pulse 83  Ht 5' 3.5" (1.613 m)  Wt 128 lb 6.4 oz (58.242 kg)  BMI 22.39 kg/m2   SpO2 95%  Physical Exam:  General- No distress,  A&Ox3 ENT: No sinus tenderness, TM clear, pale nasal mucosa, no oral exudate,no post nasal drip, no LAN Cardiac: S1, S2, regular rate and rhythm, no murmur Chest: No wheeze/ rales/ dullness; no accessory muscle use, no nasal flaring, no sternal retractions Abd.: Soft Non-tender Ext: No clubbing cyanosis, edema Neuro:  normal strength Skin: No rashes, warm and dry Psych: normal mood and behavior  Magdalen Spatz, AGACNP-BC [facility]     Assessment & Plan:

## 2015-09-09 NOTE — Assessment & Plan Note (Signed)
Slow to resolve bronchitis, patient is finally back to baseline and feeling better. No cough, fever, purulent sputum: Plan: Continue allergy regimen with Allegra and Flonase Follow up with Dr. Annamaria Boots as scheduled for Aug. 10, 2017. Please contact office for sooner follow up if symptoms do not improve or worsen or seek emergency care

## 2015-10-25 ENCOUNTER — Emergency Department (HOSPITAL_COMMUNITY)
Admission: EM | Admit: 2015-10-25 | Discharge: 2015-10-26 | Disposition: A | Discharge status: Home / Self Care | Payer: Medicare Other | Attending: Emergency Medicine | Admitting: Emergency Medicine

## 2015-10-25 ENCOUNTER — Encounter (HOSPITAL_COMMUNITY): Payer: Self-pay | Admitting: Emergency Medicine

## 2015-10-25 ENCOUNTER — Emergency Department (HOSPITAL_COMMUNITY): Payer: Medicare Other

## 2015-10-25 DIAGNOSIS — Z79899 Other long term (current) drug therapy: Secondary | ICD-10-CM | POA: Insufficient documentation

## 2015-10-25 DIAGNOSIS — E039 Hypothyroidism, unspecified: Secondary | ICD-10-CM | POA: Diagnosis not present

## 2015-10-25 DIAGNOSIS — R0602 Shortness of breath: Secondary | ICD-10-CM | POA: Diagnosis present

## 2015-10-25 DIAGNOSIS — R0600 Dyspnea, unspecified: Secondary | ICD-10-CM | POA: Diagnosis not present

## 2015-10-25 DIAGNOSIS — Z7952 Long term (current) use of systemic steroids: Secondary | ICD-10-CM | POA: Diagnosis not present

## 2015-10-25 DIAGNOSIS — M1990 Unspecified osteoarthritis, unspecified site: Secondary | ICD-10-CM | POA: Diagnosis not present

## 2015-10-25 DIAGNOSIS — E785 Hyperlipidemia, unspecified: Secondary | ICD-10-CM | POA: Insufficient documentation

## 2015-10-25 DIAGNOSIS — Z7951 Long term (current) use of inhaled steroids: Secondary | ICD-10-CM | POA: Insufficient documentation

## 2015-10-25 DIAGNOSIS — I10 Essential (primary) hypertension: Secondary | ICD-10-CM | POA: Insufficient documentation

## 2015-10-25 DIAGNOSIS — R42 Dizziness and giddiness: Secondary | ICD-10-CM | POA: Diagnosis not present

## 2015-10-25 LAB — CBC WITH DIFFERENTIAL/PLATELET
BASOS ABS: 0 10*3/uL (ref 0.0–0.1)
Basophils Relative: 0 %
EOS PCT: 2 %
Eosinophils Absolute: 0.2 10*3/uL (ref 0.0–0.7)
HEMATOCRIT: 36 % (ref 36.0–46.0)
Hemoglobin: 12.4 g/dL (ref 12.0–15.0)
LYMPHS ABS: 2.3 10*3/uL (ref 0.7–4.0)
LYMPHS PCT: 25 %
MCH: 29 pg (ref 26.0–34.0)
MCHC: 34.4 g/dL (ref 30.0–36.0)
MCV: 84.3 fL (ref 78.0–100.0)
MONO ABS: 1.1 10*3/uL — AB (ref 0.1–1.0)
MONOS PCT: 12 %
Neutro Abs: 5.6 10*3/uL (ref 1.7–7.7)
Neutrophils Relative %: 61 %
PLATELETS: 338 10*3/uL (ref 150–400)
RBC: 4.27 MIL/uL (ref 3.87–5.11)
RDW: 15 % (ref 11.5–15.5)
WBC: 9.2 10*3/uL (ref 4.0–10.5)

## 2015-10-25 MED ORDER — MECLIZINE HCL 25 MG PO TABS
25.0000 mg | ORAL_TABLET | Freq: Once | ORAL | Status: AC
  Administered 2015-10-25: 25 mg via ORAL
  Filled 2015-10-25: qty 1

## 2015-10-25 MED ORDER — ALBUTEROL SULFATE (2.5 MG/3ML) 0.083% IN NEBU: 5.0000 mg | INHALATION_SOLUTION | Freq: Once | RESPIRATORY_TRACT | Status: DC

## 2015-10-25 NOTE — ED Provider Notes (Signed)
CSN: [id number]     Arrival date & time 10/25/15  1813 History  By signing my name below, I, Emmanuella Mensah, attest that this documentation has been prepared under the direction and in the presence of Veryl Speak, MD. Electronically Signed: Judithann Sauger, ED Scribe. 10/25/2015. 12:03 AM.     Chief Complaint  Patient presents with  . Shortness of Breath   Patient is a 82 y.o. female presenting with shortness of breath. The history is provided by the patient. No language interpreter was used.  Shortness of Breath Severity:  Moderate Onset quality:  Sudden Duration:  3 weeks Timing:  Intermittent Progression:  Worsening Chronicity:  Recurrent Relieved by:  None tried Worsened by:  Nothing tried Ineffective treatments:  None tried Associated symptoms: cough   Associated symptoms: no chest pain, no fever and no headaches    HPI Comments: Sierra [name] is a 82 y.o. female with a hx of HTN, HLD, ulcerative colitis, and balance problem who presents to the Emergency Department complaining of increasing intermittent episodes of moderate shortness of breath onset 2-3 weeks ago. Pt reports associated bilateral ankle swelling and mild persistent non-productive cough. She adds intermittent dizziness (room spinning) but attributes that as a symptom associated with her brain tumor 16 years ago. She explains that she saw her PCP today for these symptoms as her dizziness has become more frequent where she had a D-dimer. Pt states that she was told to come to the ED because her D-dimer was elevated to rule out a PE/DVT. No alleviating factors noted. Pt has not tried any medication PTA. She denies that she has any medication for these episodes of SOB and dinzziness. She denies a hx of CHF, stents placed, or currently taking Lasix or any fluid pills. She denies that she is a current smoker. She denies any fever, chest pain or headache.    Past Medical History  Diagnosis Date  . Ulcerative colitis   .  Hypertension   . Hyperlipemia   . Diverticulitis, colon   . Other specified iron deficiency anemias   . Thrombocytosis ([place])   . Leukocytosis   . Esophageal reflux   . Chronic pharyngitis   . Allergic rhinitis   . Acute asthmatic bronchitis   . Hypothyroidism   . Cardiac murmur     DOES NOT CAUSE ANY SYMPTOMS  . Recurrent upper respiratory infection (URI)     ON 07/10/11 PT SAW DR. Tarri Fuller YOUNG FOR TX OF ACUTE BRONCHITIS --GIVEN  EXTRA PREDNISONE ( IN ADDITION TO THE DAILY PREDNISONE PT TAKES)  AND PT FINISHED A Z-PAK--STILL HAS A COUGH TODAY 07/15/11-NO FEVER.  Marland Kitchen Blood transfusion   . Arthritis     HX OF RT SHOULDER BURSITIS-AND THE SHOULDER IS HURTING AT PRESENT, PAIN IN LEFT KNEE AT PRESENT  AND PAIN IN LEFT HIP  . IBS (irritable bowel syndrome)   . Balance problem     SINCE BRAIN TUMOR REMOVED IN 2002-BENIGN TUMOR-PT HAS ADRENAL INSUFFICIENCY AND TAKES DAILY PREDNISONE  . GI bleed 01/06/2013   Past Surgical History  Procedure Laterality Date  . Partial colectomy  2008  . Total hip arthroplasty  OCT 2006    right  . Vesicovaginal fistula closure w/ tah    . Thyroidectomy  1986  . Appendectomy    . Craniotomy    . Subglottal mucocoel  2000  . Meningioma resected  20O2  . Frontalis suspension  10-09-2010    lifting eyelids AND SECOND EYE SURGERY November 19, 2010  . Tonsillectomy  1938  . Abdominal hysterectomy  1985  . Dilation and curettage of uterus  1967  . Eye surgery      2003 -RIGHT CATARACT EXTRACTED AND LEFT WAS DONE IN 2004  . Wrist tendon lesion removed 2007    . Total hip arthroplasty  07/27/2011    Procedure: TOTAL HIP ARTHROPLASTY;  Surgeon: Gearlean Alf, MD;  Location: [facility] ORS;  Service: Orthopedics;  Laterality: Left;  . Colonoscopy N/A 01/05/2013    Procedure: COLONOSCOPY;  Surgeon: Cleotis Nipper, MD;  Location: [facility] ENDOSCOPY;  Service: Endoscopy;  Laterality: N/A;  . Esophagogastroduodenoscopy N/A 01/05/2013    Procedure: ESOPHAGOGASTRODUODENOSCOPY (EGD);   Surgeon: Cleotis Nipper, MD;  Location: [facility] ENDOSCOPY;  Service: Endoscopy;  Laterality: N/A;  . Colonoscopy N/A 01/04/2014    Procedure: COLONOSCOPY;  Surgeon: Winfield Cunas., MD;  Location: [facility] ENDOSCOPY;  Service: Endoscopy;  Laterality: N/A;  . Esophagogastroduodenoscopy N/A 06/26/2014    Procedure: ESOPHAGOGASTRODUODENOSCOPY (EGD);  Surgeon: Winfield Cunas., MD;  Location: [facility] ENDOSCOPY;  Service: Endoscopy;  Laterality: N/A;  . Flexible sigmoidoscopy N/A 06/26/2014    Procedure: FLEXIBLE SIGMOIDOSCOPY;  Surgeon: Winfield Cunas., MD;  Location: [facility] ENDOSCOPY;  Service: Endoscopy;  Laterality: N/A;   Family History  Problem Relation Age of Onset  . Heart attack Father     deceased   Social History  Substance Use Topics  . Smoking status: Never Smoker   . Smokeless tobacco: Never Used  . Alcohol Use: Yes     Comment: occ glass of wine   OB History    No data available     Review of Systems  Constitutional: Negative for fever.  Respiratory: Positive for cough and shortness of breath.   Cardiovascular: Negative for chest pain.  Neurological: Negative for headaches.    A complete 10 system review of systems was obtained and all systems are negative except as noted in the HPI and PMH.    Allergies  Biaxin; Ciprofloxacin hcl; Lactose intolerance (gi); Sulfa antibiotics; Ciprofloxacin; Codeine; Dilantin; Doxycycline; Restasis; and Sulfonamide derivatives  Home Medications   Prior to Admission medications   Medication Sig Start Date End Date Taking? Authorizing Provider  albuterol (PROVENTIL HFA;VENTOLIN HFA) 108 (90 BASE) MCG/ACT inhaler Inhale 2 puffs into the lungs every 6 (six) hours as needed for wheezing or shortness of breath. 04/29/12 08/19/16 Yes Clinton D Young, MD  albuterol (PROVENTIL) (2.5 MG/3ML) 0.083% nebulizer solution Take 3 mLs (2.5 mg total) by nebulization every 6 (six) hours as needed for wheezing or shortness of breath. DX 493.90 08/20/15  Yes Magdalen Spatz, NP  Alpha-D-Galactosidase [facility]) TABS Take 2 tablets by mouth daily as needed (gas).   Yes Historical Provider, MD  amLODipine (NORVASC) 2.5 MG tablet Take 2.5 mg by mouth daily after breakfast.   Yes Historical Provider, MD  Artificial Tear Solution (BION TEARS) 0.1-0.3 % SOLN Place 1 drop into both eyes 2 (two) times daily as needed (dry eyes).    Yes Historical Provider, MD  B Complex-C (B-COMPLEX WITH VITAMIN C) tablet Take 1 tablet by mouth daily.   Yes Historical Provider, MD  cholecalciferol (VITAMIN D) 1000 UNITS tablet Take 1,000 Units by mouth daily.   Yes Historical Provider, MD  Cyanocobalamin (VITAMIN B-12) 2500 MCG SUBL Place 2,500 mcg under the tongue daily.   Yes Historical Provider, MD  fexofenadine (ALLEGRA) 180 MG tablet Take 180 mg by mouth daily as needed for allergies or rhinitis.  Yes Historical Provider, MD  fluticasone (FLOVENT HFA) 110 MCG/ACT inhaler Inhale 2 puffs into the lungs 2 (two) times daily.   Yes Historical Provider, MD  Ipratropium-Albuterol (COMBIVENT RESPIMAT) 20-100 MCG/ACT AERS respimat Inhale 1 puff into the lungs every 6 (six) hours as needed for wheezing or shortness of breath.   Yes Historical Provider, MD  lactase (LACTAID) 3000 UNITS tablet Take 3,000 Units by mouth daily as needed (lactose intolerant).   Yes Historical Provider, MD  levothyroxine (SYNTHROID, LEVOTHROID) 75 MCG tablet Take 75 mcg by mouth daily before breakfast.    Yes Historical Provider, MD  LIALDA 1.2 g EC tablet Take 2 tablets by mouth daily. 08/10/15  Yes Historical Provider, MD  LORazepam (ATIVAN) 0.5 MG tablet Take 0.5 mg by mouth at bedtime as needed for sleep.    Yes Historical Provider, MD  Multiple Vitamin (MULTIVITAMIN) tablet Take 1 tablet by mouth daily.   Yes Historical Provider, MD  predniSONE (DELTASONE) 5 MG tablet Take 5 mg by mouth daily. 09/07/15  Yes Historical Provider, MD  ranitidine (ZANTAC) 75 MG tablet Take 75 mg by mouth 2 (two) times daily.   Yes  Historical Provider, MD  simethicone (MYLICON) 80 MG chewable tablet Chew 80 mg by mouth every 6 (six) hours as needed for flatulence (gas).   Yes Historical Provider, MD  trimethoprim (TRIMPEX) 100 MG tablet Take 100 mg by mouth 2 (two) times a week.   Yes Historical Provider, MD   BP 157/73 mmHg  Pulse 89  Temp(Src) 98.1 F (36.7 C) (Oral)  Resp 17  SpO2 93% Physical Exam  Constitutional: She is oriented to person, place, and time. She appears well-developed and well-nourished. No distress.  HENT:  Head: Normocephalic and atraumatic.  Eyes: EOM are normal.  Neck: Normal range of motion.  Cardiovascular: Normal rate, regular rhythm and normal heart sounds.   No murmur heard. Pulmonary/Chest: Effort normal and breath sounds normal. No respiratory distress. She has no wheezes. She has no rales.  Abdominal: Soft. She exhibits no distension. There is no tenderness.  Musculoskeletal: Normal range of motion. She exhibits no edema.  No calf tenderness and Homan sign absent bilaterally   Neurological: She is alert and oriented to person, place, and time.  Skin: Skin is warm and dry.  Psychiatric: She has a normal mood and affect. Judgment normal.  Nursing note and vitals reviewed.   ED Course  Procedures (including critical care time) DIAGNOSTIC STUDIES: Oxygen Saturation is 93% on RA, low by my interpretation.    COORDINATION OF CARE: 11:14 PM- Pt advised of plan for treatment and pt agrees. Pt will receive CT head, CT angio chest, and chest x-ray for further evaluation.    Labs Review Labs Reviewed - No data to display  Imaging Review Dg Chest 2 View  10/25/2015  CLINICAL DATA:  Patient with shortness of breath. EXAM: CHEST  2 VIEW COMPARISON:  Chest radiograph 08/30/2015. FINDINGS: Stable cardiac and mediastinal contours. No consolidative pulmonary opacities. No pleural effusion or pneumothorax. Thoracic spine degenerative changes. IMPRESSION: No acute cardiopulmonary process.  Electronically Signed   By: Lovey Newcomer M.D.   On: 10/25/2015 19:16   Veryl Speak, MD has personally reviewed and evaluated these images and lab results as part of his medical decision-making.   EKG Interpretation   Date/Time:  Friday October 25 2015 18:36:54 EDT Ventricular Rate:  93 PR Interval:  148 QRS Duration: 105 QT Interval:  377 QTC Calculation: 469 R Axis:   102 Text  Interpretation:  Sinus rhythm Right axis deviation Low voltage,  extremity leads RSR' in V1 or V2, probably normal variant Confirmed by  Ahrianna Siglin  MD, Lyndzee Kliebert ([id number]) on 10/26/2015 1:35:58 AM      MDM   Final diagnoses:  None    Patient was sent here by primary Dr. for evaluation of shortness of breath and elevated d-dimer. The patient appears clinically well. Her physical examination is unremarkable, lungs are clear, and OC sats are 97%. Her EKG is unchanged and the workup reveals no evidence for a cardiac etiology. Her CT angio of the chest was negative for pulmonary embolism or other acute process. I see no emergent process and feel as though she is appropriate for discharge. She is to follow-up with her doctor this week and return if her symptoms worsen or change.  I personally performed the services described in this documentation, which was scribed in my presence. The recorded information has been reviewed and is accurate.       Veryl Speak, MD 10/26/15 [phone]

## 2015-10-25 NOTE — ED Notes (Signed)
Pt c/o shortness of breath when walking and dizziness for about 2 weeks now. Pt saw her PCP today and was told her D Dimer was elevated and they were thinking possible PE.

## 2015-10-26 ENCOUNTER — Emergency Department (HOSPITAL_COMMUNITY): Payer: Medicare Other

## 2015-10-26 LAB — D-DIMER, QUANTITATIVE (NOT AT ARMC): D DIMER QUANT: 0.7 ug{FEU}/mL — AB (ref 0.00–0.50)

## 2015-10-26 LAB — BASIC METABOLIC PANEL
ANION GAP: 9 (ref 5–15)
BUN: 15 mg/dL (ref 6–20)
CALCIUM: 9.3 mg/dL (ref 8.9–10.3)
CHLORIDE: 105 mmol/L (ref 101–111)
CO2: 25 mmol/L (ref 22–32)
Creatinine, Ser: 0.71 mg/dL (ref 0.44–1.00)
GFR calc Af Amer: >60 mL/min (ref >60)
GFR calc non Af Amer: >60 mL/min (ref >60)
GLUCOSE: 105 mg/dL — AB (ref 65–99)
Potassium: 3.2 mmol/L — ABNORMAL LOW (ref 3.5–5.1)
Sodium: 139 mmol/L (ref 135–145)

## 2015-10-26 LAB — PROTIME-INR
INR: 1.24 (ref 0.00–1.49)
Prothrombin Time: 15.2 seconds (ref 11.6–15.2)

## 2015-10-26 LAB — BRAIN NATRIURETIC PEPTIDE: B NATRIURETIC PEPTIDE 5: 40.9 pg/mL (ref 0.0–100.0)

## 2015-10-26 MED ORDER — IOPAMIDOL (ISOVUE-370) INJECTION 76%
100.0000 mL | Freq: Once | INTRAVENOUS | Status: AC | PRN
  Administered 2015-10-26: 100 mL via INTRAVENOUS

## 2015-10-26 NOTE — Discharge Instructions (Signed)
Follow-up with your primary Dr. this week, and return to the ER if symptoms significantly worsen or change.   Shortness of Breath Shortness of breath means you have trouble breathing. It could also mean that you have a medical problem. You should get immediate medical care for shortness of breath. CAUSES   Not enough oxygen in the air such as with high altitudes or a smoke-filled room.  Certain lung diseases, infections, or problems.  Heart disease or conditions, such as angina or heart failure.  Low red blood cells (anemia).  Poor physical fitness, which can cause shortness of breath when you exercise.  Chest or back injuries or stiffness.  Being overweight.  Smoking.  Anxiety, which can make you feel like you are not getting enough air. DIAGNOSIS  Serious medical problems can often be found during your physical exam. Tests may also be done to determine why you are having shortness of breath. Tests may include:  Chest X-rays.  Lung function tests.  Blood tests.  An electrocardiogram (ECG).  An ambulatory electrocardiogram. An ambulatory ECG records your heartbeat patterns over a 24-hour period.  Exercise testing.  A transthoracic echocardiogram (TTE). During echocardiography, sound waves are used to evaluate how blood flows through your heart.  A transesophageal echocardiogram (TEE).  Imaging scans. Your health care provider may not be able to find a cause for your shortness of breath after your exam. In this case, it is important to have a follow-up exam with your health care provider as directed.  TREATMENT  Treatment for shortness of breath depends on the cause of your symptoms and can vary greatly. HOME CARE INSTRUCTIONS   Do not smoke. Smoking is a common cause of shortness of breath. If you smoke, ask for help to quit.  Avoid being around chemicals or things that may bother your breathing, such as paint fumes and dust.  Rest as needed. Slowly resume your  usual activities.  If medicines were prescribed, take them as directed for the full length of time directed. This includes oxygen and any inhaled medicines.  Keep all follow-up appointments as directed by your health care provider. SEEK MEDICAL CARE IF:   Your condition does not improve in the time expected.  You have a hard time doing your normal activities even with rest.  You have any new symptoms. SEEK IMMEDIATE MEDICAL CARE IF:   Your shortness of breath gets worse.  You feel light-headed, faint, or develop a cough not controlled with medicines.  You start coughing up blood.  You have pain with breathing.  You have chest pain or pain in your arms, shoulders, or abdomen.  You have a fever.  You are unable to walk up stairs or exercise the way you normally do. MAKE SURE YOU:  Understand these instructions.  Will watch your condition.  Will get help right away if you are not doing well or get worse.   This information is not intended to replace advice given to you by your health care provider. Make sure you discuss any questions you have with your health care provider.   Document Released: 01/20/2001 Document Revised: 05/02/2013 Document Reviewed: 07/13/2011 Elsevier Interactive Patient Education [employer].

## 2015-11-05 ENCOUNTER — Telehealth: Payer: Self-pay | Admitting: Cardiovascular Disease

## 2015-11-05 NOTE — Telephone Encounter (Signed)
Received records from [facility] for appointment on 12/04/15 with Dr Gwenlyn Found.  Records given to [facility] (medical records) for Dr Kennon Holter schedule on 12/04/15. lp

## 2015-12-04 ENCOUNTER — Ambulatory Visit (INDEPENDENT_AMBULATORY_CARE_PROVIDER_SITE_OTHER): Payer: Medicare Other | Admitting: Cardiovascular Disease

## 2015-12-04 ENCOUNTER — Encounter: Payer: Self-pay | Admitting: Cardiovascular Disease

## 2015-12-04 VITALS — BP 152/73 | HR 73 | Ht 63.0 in | Wt 129.2 lb

## 2015-12-04 DIAGNOSIS — R0609 Other forms of dyspnea: Secondary | ICD-10-CM | POA: Diagnosis not present

## 2015-12-04 DIAGNOSIS — I10 Essential (primary) hypertension: Secondary | ICD-10-CM | POA: Diagnosis not present

## 2015-12-04 NOTE — Patient Instructions (Addendum)
Medication Instructions:  Your physician recommends that you continue on your current medications as directed. Please refer to the Current Medication list given to you today.   Testing/Procedures: Your physician has requested that you have an echocardiogram. Echocardiography is a painless test that uses sound waves to create images of your heart. It provides your doctor with information about the size and shape of your heart and how well your heart's chambers and valves are working. This procedure takes approximately one hour. There are no restrictions for this procedure.  NEEDS TO BE SCHEDULED BEFORE THE LEXISCAN.  Your physician has requested that you have a lexiscan myoview. For further information please visit [email or web address]. Please follow instruction sheet, as given.  SCHEDULE AFTER THE ECHO HAS BEEN DONE.    Follow-Up: Your physician recommends that you schedule a follow-up appointment in 3-4 WEEKS WITH DR BERRY AFTER TESTING.   Pharmacologic Stress Echocardiogram A pharmacologic stress echocardiogram is a heart (cardiac) test used to check the function of your heart. This test may also be called a pharmacologic stress echocardiography. Pharmacologic means that a medicine is used to increase your heart rate and blood pressure.  This stress test will check how well your heart muscle and valves are working and determine if your heart muscle is getting enough blood. Some people exercise on a treadmill, which naturally increases or stresses the functioning of their heart. For those people unable to exercise on a treadmill, a medicine is used. This medicine stimulates your heart and will cause your heart to beat harder and more quickly, as if you were exercising.  An echocardiogram uses sound waves (ultrasound) to produce an image of your heart. If your heart does not work normally, it may indicate coronary artery disease with poor coronary blood supply. The coronary arteries are the arteries  that bring blood and oxygen to your heart. LET [facility] CARE PROVIDER KNOW ABOUT:  Any allergies you have.  All medicines you are taking, including vitamins, herbs, eye drops, creams, and over-the-counter medicines.  Previous problems you or members of your family have had with the use of anesthetics.  Any blood disorders you have.  Previous surgeries you have had.  Medical conditions you have.  Possibility of pregnancy, if this applies. RISKS AND COMPLICATIONS Generally, this is a safe procedure. However, as with any procedure, complications can occur. Possible complications can include:  You develop pain or pressure in the following areas:  Chest.  Jaw or neck.  Between your shoulder blades.  Radiating down your left arm.  Headache.  Dizziness or light-headedness.  Shortness of breath.  Increased or irregular heartbeat.  Low blood pressure.  Nausea or vomiting.  Flushing.  Redness going up the arm and slight pain during injection of medicine.  Heart attack (rare). BEFORE THE PROCEDURE  Avoid all forms of caffeine for 24 hours before your test or as directed by your health care provider. This includes coffee, tea (even decaffeinated tea), caffeinated sodas, chocolate, cocoa, and certain pain medicines.  Follow your health care provider's instructions regarding eating and drinking before the test.  Take your medicines as directed at regular times with water unless instructed otherwise. Exceptions may include:  If you have diabetes, ask how you are to take your insulin or pills. It is common to adjust insulin dosing the morning of the test.  If you are taking beta-blocker medicines, it is important to talk to your health care provider about these medicines well before the date of your test. Taking  beta-blocker medicines may interfere with the test. In some cases, these medicines need to be changed or stopped 24 hours or more before the test.  If you wear a  nitroglycerin patch, it may need to be removed prior to the test. Ask your health care provider if the patch should be removed before the test.  If you use an inhaler for any breathing condition, bring it with you to the test.  If you are an outpatient, bring a snack so you can eat right after the stress phase of the test.  Do not smoke for 4 hours prior to the test or as directed by your health care provider.  Wear comfortable shoes and clothing. Let your health care provider know if you were unable to complete or follow the preparations for your test. PROCEDURE   Multiple electrodes will be put on your chest. If needed, small areas of your chest may be shaved to get better contact with the electrodes. Once the electrodes are attached to your body, multiple wires will be attached to the electrodes, and your heart rate will be monitored.  An IV access will be started, and medicine will be given.  You will have an echocardiogram done at rest and done again at peak heart rate.  To produce an image of the heart, gel is applied to your chest, and a wand-like tool (transducer) is moved over the chest. The transducer sends the sound waves through the chest to create the moving images of your heart. AFTER THE PROCEDURE  Your heart rate and blood pressure will be monitored after the test.  You may return to your normal schedule, including diet, activities, and medicines, unless your health care provider tells you otherwise.   This information is not intended to replace advice given to you by your health care provider. Make sure you discuss any questions you have with your health care provider.   Document Released: 07/18/2003 Document Revised: 05/02/2013 Document Reviewed: 01/02/2013 Elsevier Interactive Patient Education [employer].    If you need a refill on your cardiac medications before your next appointment, please call your pharmacy.

## 2015-12-04 NOTE — Assessment & Plan Note (Signed)
Mr. Sierra [name] was referred today by Dr. [name] for new onset dyspnea on exertion. She does have reactive airways disease followed by Dr. Annamaria Boots. She had a normal 2-D echo back in 2013. Risk factors include family history, hypertension and hyperlipidemia. I'm going to repeat a 2-D echo as well as obtain a pharmacologic Myoview stress test to further evaluate her dyspnea.

## 2015-12-04 NOTE — Assessment & Plan Note (Signed)
History of hyperlipidemia not on statin therapy followed by her PCP 

## 2015-12-04 NOTE — Assessment & Plan Note (Signed)
History of hypertension with blood pressure measured today at 152/73. She is on amlodipine. Continue current meds at current dosing

## 2015-12-04 NOTE — Progress Notes (Signed)
12/04/2015 Sierra [name]   16-Sep-1928  [id number]  Primary Physician ARONSON,RICHARD A, MD Primary Cardiologist: Lorretta Harp MD Renae Gloss  HPI:  Sierra [name] is a delightful 82 year old thin-appearing married Caucasian female mother of 2, grandmother of 49 grandchildren who is accompanied by her husband provided today was also patient of mine. She was referred by Dr. Dr. [name] for cardiovascular evaluation because of new-onset dyspnea on exertion. She has a history of hypertension and hyperlipidemia both treated. Her father died of a myocardial infarction at age 77. She has reactive airways disease treated by Dr. Annamaria Boots. She had a benign meningioma surgically removed with forced [facility] approximately 15 years ago. They moved into a retirement community recently and she's noticed increasing dyspnea over the last 2 months. She denies chest pain. A 2-D echocardiogram performed 10/05/11 was normal.   Current Outpatient Prescriptions  Medication Sig Dispense Refill  . albuterol (PROVENTIL HFA;VENTOLIN HFA) 108 (90 BASE) MCG/ACT inhaler Inhale 2 puffs into the lungs every 6 (six) hours as needed for wheezing or shortness of breath. 1 Inhaler prn  . albuterol (PROVENTIL) (2.5 MG/3ML) 0.083% nebulizer solution Take 3 mLs (2.5 mg total) by nebulization every 6 (six) hours as needed for wheezing or shortness of breath. DX 493.90 360 mL 12  . Alpha-D-Galactosidase (BEANO) TABS Take 2 tablets by mouth daily as needed (gas).    Marland Kitchen amLODipine (NORVASC) 2.5 MG tablet Take 2.5 mg by mouth daily after breakfast.    . Artificial Tear Solution (BION TEARS) 0.1-0.3 % SOLN Place 1 drop into both eyes 2 (two) times daily as needed (dry eyes).     . B Complex Vitamins (VITAMIN B COMPLEX PO) Take 1 tablet by mouth daily.    . B Complex-C (B-COMPLEX WITH VITAMIN C) tablet Take 1 tablet by mouth daily.    . cholecalciferol (VITAMIN D) 1000 UNITS tablet Take 1,000 Units by mouth daily.     . Cyanocobalamin (VITAMIN B-12) 2500 MCG SUBL Place 2,500 mcg under the tongue daily.    . famotidine (PEPCID) 20 MG tablet Take 20 mg by mouth 2 (two) times daily.    . fexofenadine (ALLEGRA) 180 MG tablet Take 180 mg by mouth daily as needed for allergies or rhinitis.    . fluticasone (FLOVENT HFA) 110 MCG/ACT inhaler Inhale 2 puffs into the lungs 2 (two) times daily.    . Hypromellose 0.2 % SOLN Place 1 drop into both eyes at bedtime.    . Ipratropium-Albuterol (COMBIVENT RESPIMAT) 20-100 MCG/ACT AERS respimat Inhale 1 puff into the lungs every 6 (six) hours as needed for wheezing or shortness of breath.    . lactase (LACTAID) 3000 UNITS tablet Take 3,000 Units by mouth daily as needed (lactose intolerant).    Marland Kitchen levothyroxine (SYNTHROID, LEVOTHROID) 75 MCG tablet Take 75 mcg by mouth daily before breakfast.     . LIALDA 1.2 g EC tablet Take 2 tablets by mouth daily.    Marland Kitchen LORazepam (ATIVAN) 0.5 MG tablet Take 0.5 mg by mouth at bedtime as needed for sleep.     . Multiple Vitamin (MULTIVITAMIN) tablet Take 1 tablet by mouth daily.    . predniSONE (DELTASONE) 5 MG tablet Take 5 mg by mouth daily.    . simethicone (MYLICON) 80 MG chewable tablet Chew 80 mg by mouth as needed for flatulence.     No current facility-administered medications for this visit.     Allergies  Allergen Reactions  . Biaxin [  Clarithromycin]     Bitter taste  . Ciprofloxacin Hcl   . Lactose Intolerance (Gi)     gas  . Sulfa Antibiotics   . Ciprofloxacin Rash  . Codeine Nausea And Vomiting  . Dilantin [Phenytoin] Rash  . Doxycycline Nausea Only    Very nauseated  . Restasis [Cyclosporine] Other (See Comments)    Eyes burn   . Sulfonamide Derivatives Rash    Social History   Social History  . Marital status: Married    Spouse name: N/A  . Number of children: 2  . Years of education: N/A   Occupational History  . Retired Loss adjuster, [employer], [place] History Main Topics  .  Smoking status: Never Smoker  . Smokeless tobacco: Never Used  . Alcohol use Yes     Comment: occ glass of wine  . Drug use: No  . Sexual activity: No   Other Topics Concern  . 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  Cardiovascular: negative for chest pain, dyspnea on exertion, edema, orthopnea, palpitations, paroxysmal nocturnal dyspnea or shortness of breath Dermatological: negative for rash Respiratory: negative for cough or wheezing Urologic: negative for hematuria Abdominal: negative for nausea, vomiting, diarrhea, bright red blood per rectum, melena, or hematemesis Neurologic: negative for visual changes, syncope, or dizziness All other systems reviewed and are otherwise negative except as noted above.    Blood pressure (!) 152/73, pulse 73, height 5\' 3"  (1.6 m), weight 129 lb 3.2 oz (58.6 kg).  General appearance: alert and no distress Neck: no adenopathy, no carotid bruit, no JVD, supple, symmetrical, trachea midline and thyroid not enlarged, symmetric, no tenderness/mass/nodules Lungs: clear to auscultation bilaterally Heart: regular rate and rhythm, S1, S2 normal, no murmur, click, rub or gallop Extremities: extremities normal, atraumatic, no cyanosis or edema  EKG not performed today  ASSESSMENT AND PLAN:   Dyspnea on exertion Mr. Sabra Heck was referred today by Dr. [name] for new onset dyspnea on exertion. She does have reactive airways disease followed by Dr. Annamaria Boots. She had a normal 2-D echo back in 2013. Risk factors include family history, hypertension and hyperlipidemia. I'm going to repeat a 2-D echo as well as obtain a pharmacologic Myoview stress test to further evaluate her dyspnea.  Essential hypertension History of hypertension with blood pressure measured today at  152/73. She is on amlodipine. Continue current meds at current dosing  HYPERLIPIDEMIA History of hyperlipidemia not on statin therapy followed by her PCP      Lorretta Harp MD [facility], [facility] 12/04/2015 10:53 AM

## 2015-12-17 ENCOUNTER — Ambulatory Visit (HOSPITAL_COMMUNITY): Payer: Medicare Other | Attending: Cardiology

## 2015-12-17 ENCOUNTER — Other Ambulatory Visit: Payer: Self-pay

## 2015-12-17 DIAGNOSIS — I119 Hypertensive heart disease without heart failure: Secondary | ICD-10-CM | POA: Diagnosis not present

## 2015-12-17 DIAGNOSIS — I351 Nonrheumatic aortic (valve) insufficiency: Secondary | ICD-10-CM | POA: Insufficient documentation

## 2015-12-17 DIAGNOSIS — I340 Nonrheumatic mitral (valve) insufficiency: Secondary | ICD-10-CM | POA: Insufficient documentation

## 2015-12-17 DIAGNOSIS — R0609 Other forms of dyspnea: Secondary | ICD-10-CM | POA: Diagnosis not present

## 2015-12-17 DIAGNOSIS — E785 Hyperlipidemia, unspecified: Secondary | ICD-10-CM | POA: Insufficient documentation

## 2015-12-17 DIAGNOSIS — R0600 Dyspnea, unspecified: Secondary | ICD-10-CM | POA: Diagnosis present

## 2015-12-19 ENCOUNTER — Telehealth (HOSPITAL_COMMUNITY): Payer: Self-pay

## 2015-12-19 ENCOUNTER — Ambulatory Visit (INDEPENDENT_AMBULATORY_CARE_PROVIDER_SITE_OTHER): Payer: Medicare Other | Admitting: Internal Medicine

## 2015-12-19 ENCOUNTER — Encounter: Payer: Self-pay | Admitting: Internal Medicine

## 2015-12-19 VITALS — BP 112/68 | HR 84 | Ht 63.5 in | Wt 129.8 lb

## 2015-12-19 DIAGNOSIS — J4540 Moderate persistent asthma, uncomplicated: Secondary | ICD-10-CM | POA: Diagnosis not present

## 2015-12-19 DIAGNOSIS — J45909 Unspecified asthma, uncomplicated: Secondary | ICD-10-CM

## 2015-12-19 NOTE — Assessment & Plan Note (Addendum)
Although she has had multiple episodes of severe and prolonged asthmatic bronchitis, lung function today seems good but she remains somewhat tachypnea Even at rest. Office spirometry is normal and recent CT scan of the chest saw no pertinent abnormality. If this is not cardiogenic then muscle weakness and deconditioning are likely. I agree with Dr. Jacquiline Doe use of maintenance prednisone in case of adrenal insufficiency.

## 2015-12-19 NOTE — Telephone Encounter (Signed)
Encounter complete. 

## 2015-12-19 NOTE — Progress Notes (Signed)
[name], female    DOB: 17-Apr-1929, 82 y.o.   MRN: [id number]  HPI 10/27/10- 82 yoF followed for asthma complicated by allergic rhinitis, bronchitis, GERD, hx of atypical AFB. Allergy vaccine- Dr Donneta Romberg. Last here -June 27, 2010- PFTs reviewed at that visit. Since then has done very well with no significant flares. She feels getting away from winter viruses was key.  She just had blepharoplasty.   02/18/11-  82 yoF followed for asthma complicated by allergic rhinitis, bronchitis, GERD, hx of atypical AFB. Allergy vaccine- Dr Donneta Romberg. She has gotten to the summer and early fall feeling very well. Really minimal chest tightness or nasal congestion. She has not been needing her nebulizer meds or rescue inhaler. We discussed the change in delivery system for Combivent. She will get one more of the current style to keep available. She has not been needing antihistamines or nasal spray.  04/27/11-  82 yoF followed for asthma complicated by allergic rhinitis, bronchitis, GERD, hx of atypical AFB. Allergy vaccine- Dr Donneta Romberg. Hx brain tumor/ adrenal insufficiency.  Husband here. Has had flu vaccine. Did well until 10 days to 2 weeks ago. She had overnight onset of cough but seemed to improve until last night when she again got acutely ill with cough, clear foamy sputum, decreased appetite, sore throat, muscle aches. Temperature at home today was 100. She continues prednisone 5 mg daily for adrenal insufficiency after treatment for brain tumor. Due for hip replacement in March.  05/15/11-  82 yoF followed for asthma complicated by allergic rhinitis, bronchitis, GERD, hx of atypical AFB. Allergy vaccine- Dr Donneta Romberg. Hx brain tumor/ adrenal insufficiency.  Husband is with her. She continues to wheeze with the exacerbation for which we saw her December 17. Sputum was cultured but is now foamy and white. She denies fever as she finishes amoxicillin. Sleeping on 3 pillows. Her nebulizer  machine made her too nervous. She had originally taken the Zithromax pack before amoxicillin. Doxycycline caused nausea and she had to stop. She has gone to 2 or 3 separate social functions that she felt she had to attend although she remains very tight in the chest with much cough and shortness of breath.  07/10/11-  82 yoF followed for asthma complicated by allergic rhinitis, bronchitis, GERD, hx of atypical AFB. Allergy vaccine- Dr Donneta Romberg. Hx brain tumor/ adrenal insufficiency.  Pending hip replacement March 18. Feeling mild chest and head congestion over the past 2 days with increased cough. Last night was wheezing. Has not needed her nebulizer machine and denies fever or malaise. Husband just had colon cancer resected.   Acute OV 07/21/11 -- 82 yo woman, hx asthma that has been labile and affected by allergies, GERD. 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 Presents today noting . She was just treated with Augmentin starting 3/8, finished a pred taper from 3/1 back down to chronic 5mg  qd. She is on flovent, combivent prn, nebulized albuterol. Also fexofenedine and omeprazole.  She has had bouts of severe cough, UA noise, wheezing. No fevers, chills, sputum, CP. She is having PND. No GERD sx.  PHARYNGITIS, CHRONIC Flaring following apparent URI and resistant to rx with pred taper and also abx. Exacerbating factors are PND and GERD. She is taking allegra prn, omeprazole qd. Will try to ramp up rx of both factors to see if we can calm her throat down. - Start NSW qd - continue allegra - increase omeprazole to bid temporarily - continue pred 5 and  finish the augmentin - not clear that this is her asthma, sounds like all UA symptoms, although she confuses the two and calls it all asthma. It would be tempting to give her a break from Flovent to see if it would calm her throat down, since this is the driving problem at this time. She will continue for now. Asked her to use her nebs prn and not on a schedule.   - she will call later this week to update on her progress ROV Dr Annamaria Boots in 3 -4 weeks  08/25/11- 82 yoF followed for asthma complicated by allergic rhinitis, bronchitis, GERD, hx of atypical AFB. Allergy vaccine- Dr Donneta Romberg. Hx brain tumor/ adrenal insufficiency. PCP Dr [name]  [name],fatigue deneis any wheezing, cough  .having  dizzy spells  more frequent. Had left knee replacement March 18 under spinal anesthesia. Wheezed immediately before surgery and was given a nebulizer treatment preop. Since then has done very well and says she feels well today. Did have lightheadedness or dizziness which was evaluated with EKG and CT scan. History of meningioma 11 years ago.  10/09/2011 Acute OV  Complains of cough with clear mucus, wheezing, DOE, decreased energy x1week  Barky cough,  Exposed to paint fumes and cough started.  No fever or discolored mucus.  No hemoptysis , no edema.   11/10/11- 82 yoF followed for asthma complicated by allergic rhinitis, bronchitis, GERD, hx of atypical AFB. Hx brain tumor/ adrenal insufficiency. PCP Dr [name] Was seen by a nurse practitioner in May and treated with Augmentin and prednisone increased to 10 mg. She is now back to 5 mg of prednisone daily. Staying indoors to avoid the weather. Short of breath since left hip replacement surgery in March. Occasional sneeze. She dropped off of Dr. Rush Landmark allergy vaccine. Cough produces scant phlegm. Denies chest pain. She says recent echocardiogram and EKG were "fine". Dyspnea on exertion. She just stopped to rest making her bed.  CXR- 07/15/11-  IMPRESSION:  Mild hyperinflation. No active disease.  Original Report Authenticated By: Raelyn Number, M.D.   03/14/12- 82 yoF followed for asthma complicated by allergic rhinitis, bronchitis, GERD, hx of atypical AFB. Hx brain tumor/ adrenal insufficiency. PCP Dr [name]    husband here FOLLOWS FOR: still having alot of cough-thick foamy light  green in color; afraid to take Avelox after reading about side effects because her right shoulder is still healing.  Acute illness started as a cold. Now deep rattle comes and goes, worse over the past 3 weeks but she sleeps well. Coughs out "clear foam". She remains on prednisone 5 mg daily.  04/29/12- 15 yoF followed for asthma complicated by allergic rhinitis, bronchitis, GERD, hx of atypical AFB. Hx brain tumor/ adrenal insufficiency. PCP Dr [name]    husband here ACUTE VISIT: went to Neuro MD yesterday for dizzy spells and unable to walk straight-told  to follow up here for Increased SOB-states she can tell her O2 gets low; feels weak and dizzy; not using Combivent-feels like it burns her throat and needs Flovent refilled She is worried she may have a recurrence of her brain tumor. At her primary physician's office yesterday for prednisone was increased to 15 mg twice daily for 3 days then 10 mg twice daily for 3 days then 5 mg twice daily x3 days, then back to her 5 mg daily maintenance. She says a chest x-ray was done "no pneumonia" CXR 03/14/12 IMPRESSION:  No active disease. Mild hyperinflation  again noted.  Original Report Authenticated By: Lahoma Crocker, M.D.   05/16/12-  FOLLOWS FOR: pt states her breathing has gotten worse since last OV. reports that her breathing is good in the AM but after lunch is worsens. lots of coughing and wheezing.  Husband here "Bad December. " She describes coughing paroxysms and feels well in between. No effect of cough syrup except that it helps her sleep. Husband hears crackling sounds when she is lying down, breathing quietly. She was on a prednisone taper back to her maintenance 5 mg daily. Finished Augmentin last night. Using Combivent 4 times daily. Tussive soreness upper anterior chest wall. Scant productive white foamy sputum. No fever. Watery mucus from her nose triggered by coughing. She had 2 chest x-rays in November and one in December. One of them at her  primary physician. All described as clear CT chest 08/21/2011 from [facility] shows some mild emphysema and arterial calcifications but otherwise negative.  06/13/12- 41 yoF followed for asthma complicated by allergic rhinitis, bronchitis, GERD, hx of atypical AFB. Hx brain tumor/ adrenal insufficiency. PCP Dr [name]   FOLLOWS FOR: still has productive cough at times; wonders in allergies are starting to flare up again-sneezing. Sneezing and watery rhinorrhea which she attributes to "allergy". Dry cough is much improved compared with last visit. She finished her Biaxin 2 weeks ago. Continues prednisone 5 mg daily maintenance, plus her Flovent inhaler and uses rescue inhaler once or twice a day when needed. Sputum culture from 05/16/2012-normal flora. AFB smear negative.  08/09/12- 75 yoF followed for asthma complicated by allergic rhinitis, bronchitis, GERD, hx of atypical AFB. Hx brain tumor/ adrenal insufficiency. PCP Dr [name]  FOLLOWS [id number] any SOB, wheezing, cough, or congestion today. Would like to discuss Dymista(has been using as needed)-noticed it caused dry mouth before. Feels as comfortable with her breathing now if she ever gets. Occasional use of rescue inhaler but no significant cough or wheeze recently.  10/14/12- 3 yoF followed for asthma complicated by allergic rhinitis, bronchitis, GERD, hx of atypical AFB. Hx brain tumor/ adrenal insufficiency. PCP Dr [name]   Husband here ACUTE VISIT: Started feeling bad Wednesday last week; Thursday started wheezing, cough, SOB, fevers of 100's, not wanting to eat-nothing to eat today. She had done too much in yard work for 2 days. For the past 8 or 10 days has felt acutely ill with cough especially bad for the past 2 days. Deep cough, exhausted. Nebulizer does not help. Dr [name] also wanted to see if increasing maintenance prednisone would help her chronic dizziness.  02/09/13- 3 yoF followed for asthma complicated by allergic rhinitis,  bronchitis, GERD, hx of atypical AFB. Hx brain tumor/ adrenal insufficiency. PCP Dr [name]   Husband here FOLLOWS [id number] she has had a few flare ups since last visit(used rescue inhaler maybe 3 times) Had flu vaccine We reviewed her recent hospitalization- salivary stone surgery complicated by bleeding diverticulum. Dymista helps sneeze and rhinorhea.  06/13/13-84 yoF followed for asthma complicated by allergic rhinitis, bronchitis, GERD, hx of atypical AFB. Hx brain tumor/ adrenal insufficiency. PCP Dr [name]   Husband here FOLLOWS FOR: pt is currently having cough-productive-green; no fevers, runny nose and sneezing. Has been going on since Saturday.  06/30/13- 84 yoF followed for asthma complicated by allergic rhinitis, bronchitis, GERD, hx of atypical AFB. Hx brain tumor/ adrenal insufficiency. PCP Dr [name]   Husband here Acute hosp 2/13-2/17/15 for AE asthma/ bronchitis FOLLOWS FOR:  Breathing and cough have improved.  c/o: of  stroke affecting (L) eye and causing dizzy spells Breathing has been much better since hospital, without wheeze now. Using just Flovent without needing her nebulizer. Tapering prednisone towards 5 mg daily maintenance dose. CXR 06/23/13 IMPRESSION:  Hyperexpanded lungs and bronchitic change without acute  cardiopulmonary disease. Specifically, no definite evidence of  pneumonia.  Electronically Signed  By: Sandi Mariscal M.D.  On: 06/23/2013 19:14  10/11/13- 65 yoF followed for asthma complicated by allergic rhinitis, bronchitis, GERD, hx of atypical AFB. Complicated by Hx brain tumor/ adrenal insufficiency, CVA. PCP Dr [name]   Husband here FOLLOWS FOR:Pt states she has been doing well since seen last; had to use her rescue inhaler a few times since 06-2013 visit. Rare need for rescue inhaler but needs refill. Doing a lot of yard work. Continues prednisone maintenance 5 mg daily. Had CVA which left her dizzy and with decreased eyesight.  02/12/14-  25 yoF followed  for asthma complicated by allergic rhinitis, bronchitis, GERD, hx of atypical AFB. Complicated by Hx brain tumor/ adrenal insufficiency, CVA. PCP Dr [name]   Husband here FOLLOW FOR: Constantly dizzy, cough in the mornings  08/13/14- 84 yoF followed for asthma complicated by allergic rhinitis, bronchitis, GERD, hx of atypical AFB. Complicated by Hx brain tumor/ adrenal insufficiency, CVA. PCP Dr [name]   Husband here FOLLOWS FOR: Increased asthma flares, pt went to beach and reports pollen was high and she was around two dogs in that house. Using Albuterol and Flovent as directed. Minor cough and congestion since the pollen came out but Mucinex helps. Allegra controls watery rhinorrhea. Her albuterol and Flovent controlled asthma sufficiently. She has not had a serious flare in months.  02/11/15-85 yoF followed for asthma complicated by allergic rhinitis, bronchitis, GERD, hx of atypical AFB. Complicated by Hx brain tumor/ adrenal insufficiency, CVA. PCP Dr [name]   Husband here  FOLLOWS FOR: pt. states she is doing well. no wheezing. occ. chest pain. no chest tightness. occ. dizzy. prod. cough green in color.  Has moved to [place]. Had flu vaccine. She fell and hit her head with workup including CT chest reviewed below. Has not needed her nebulizer machine in years. Uses rescue inhaler about once a month. No recognized asthma in the last 6 months. Sneeze and watery rhinorrhea in the mornings now. CT chest 12/20/14 images and report reviewed with her IMPRESSION: 1. No evidence of traumatic injury to the chest. 2. Mild bibasilar atelectasis noted. Lungs otherwise clear. 3. Calcification at the mitral valve. 4. 2.1 cm cystic lesion at the proximal body of the pancreas. This is increased in size from 8 mm in 2011. A very slow-growing tumor cannot be excluded. MRCP could be considered for further evaluation, if the patient can hold her breath for the study. Would correlate with pancreatic lab  values, as deemed clinically appropriate. 5. 2.6 cm hypodensity at the periphery of the spleen may reflect remote ischemic injury, as a larger vague area of decreased attenuation was seen in 2012. Nonspecific 8 mm hypodensity at the superior aspect of the spleen. Electronically Signed  By: Garald Balding M.D.  On: 12/20/2014 21:28  08/13/2015-86 yoF followed for asthma complicated by allergic rhinitis, bronchitis, GERD, hx of atypical AFB. Complicated by Hx brain tumor/ adrenal insufficiency, CVA. PCP Dr [name] FOLLOWS FOR: Pt went to [facility] ED last night for asthma attack with URI; unsure of when and how to take prednisone Rx's. Pt states she has had this cough for about 1 week now. Was treated with prednisone 40  mg daily 4 days at discharge. She says she has been exhausted since September, first they moved to [facility], and then husband required heart surgery. Started on prednisone 20 mg daily March 6/Dr. Edwards/GI, for colitis. Current wheezing began despite that. Denies current fever, purulent discharge, chest pain, blood or adenopathy. CXR 08/12/2015 IMPRESSION: Mild hyperinflation. Cardiomegaly. Streaky left basilar atelectasis or early infiltrate. Electronically Signed  By: Lahoma Crocker M.D.  On: 08/12/2015 19:14  12/19/2015-82 year old female never smoker followed for asthma complicated by allergic rhinitis, bronchitis, GERD, history atypical AFB, history brain tumor/adrenal insufficiency, CVA Has been seeing NP last few visits for her pattern of slowly resolving asthmatic bronchitis exacerbations. FOLLOWS FOR: Pt was stung by yellow jackets 2 weeks ago and would like to know about an Epipen-if needed. Pt states she is also having increase in SOB-unsteady gait as well. Has been feeling weak, more easily short of breath with exertion, unsteady on her feet, especially walking and bending. Albuterol rescue inhaler helps some. No active wheeze or cough. Dr. [name] increased her  maintenance prednisone to 10 mg daily. Echocardiogram and nuclear stress test being done by Dr. Gwenlyn Found. She had local reaction to yellow jacket sting on her thigh with no systemic reaction and no past history of insect venom allergy. Office Spirometry 12/19/2015- WNL-FVC 1.93/82%, FEV1 1.50/86%, FEV1/FVC 0.77, FEF 25-75 percent 1.31/111%. CTa chest 10/26/15-  IMPRESSION: No evidence of significant pulmonary embolus. No evidence of active pulmonary disease. Unchanged cystic lesion in the body of the pancreas. Electronically Signed   By: Lucienne Capers M.D.   On: 10/26/2015 01:14  ROS-see HPI Constitutional:   No-   weight loss, night sweats, fevers, chills, fatigue, lassitude. HEENT:   No-  headaches, difficulty swallowing, tooth/dental problems, sore throat,    itching, ear ache, +nasal congestion, + nasal drip,  CV:  No-   chest pain, orthopnea, PND, swelling in lower extremities, anasarca, dizziness, palpitations Resp: +shortness of breath with exertion or at rest.              No- productive cough, no- non-productive cough,  No- coughing up of blood.              No-   change in color of mucus.  No- wheezing.   Skin: No-   rash or lesions. GI:  No-   heartburn, indigestion, abdominal pain, nausea, vomiting,  GU:  MS:  No-   joint pain or swelling.  . Neuro-     +dizzy since ophthalmic artery stroke Psych:  No- change in mood or affect. No depression or anxiety.  No memory loss.  OBJ- Physical Exam General- Alert, Oriented, Affect-appropriate, Distress- none acute.  Skin- rash-none, lesions- none, excoriation- none Lymphadenopathy- none Head- atraumatic            Eyes- Gross vision intact, PERRLA, conjunctivae and secretions clear            Ears- Hearing, canals-normal            Nose- Clear, no-Septal dev, mucus, polyps, erosion, perforation             Throat- Mallampati II , mucosa clear , drainage- none, tonsils- atrophic. Neck- flexible , trachea midline, stridor-none,  thyroid nl, carotid no bruit Chest - symmetrical excursion , unlabored           Heart/CV- RRR  , no murmur , no gallop  , no rub, nl s1 s2                           -  JVD- none , edema- none, stasis changes- none, varices- none           Lung-  Clear, wheeze-none, cough-none , dullness-none, rub- none, + mildly labored            Chest wall-  Abd-  Br/ Gen/ Rectal- Not done, not indicated Extrem- cyanosis- none, clubbing, none, atrophy- none, strength- nl Neuro- grossly intact to observation

## 2015-12-19 NOTE — Patient Instructions (Signed)
I don't think you need an Epi-pen, but keep some benadryl on hand in case you get stung again  I'm glad Dr Gwenlyn Found is doing your heart tests  Order- Office Spirometry   Dx asthma

## 2015-12-24 ENCOUNTER — Ambulatory Visit (HOSPITAL_COMMUNITY)
Admission: RE | Admit: 2015-12-24 | Discharge: 2015-12-24 | Disposition: A | Discharge status: Home / Self Care | Payer: Medicare Other | Source: Ambulatory Visit | Attending: Urology | Admitting: Urology

## 2015-12-24 DIAGNOSIS — Z8249 Family history of ischemic heart disease and other diseases of the circulatory system: Secondary | ICD-10-CM | POA: Diagnosis not present

## 2015-12-24 DIAGNOSIS — I10 Essential (primary) hypertension: Secondary | ICD-10-CM | POA: Insufficient documentation

## 2015-12-24 DIAGNOSIS — R0609 Other forms of dyspnea: Secondary | ICD-10-CM | POA: Insufficient documentation

## 2015-12-24 LAB — MYOCARDIAL PERFUSION IMAGING
CHL CUP NUCLEAR SSS: 4
CHL CUP RESTING HR STRESS: 82 {beats}/min
CSEPPHR: 107 {beats}/min
LVDIAVOL: 54 mL (ref 46–106)
LVSYSVOL: 13 mL
NUC STRESS TID: 1.25
SDS: 0
SRS: 4

## 2015-12-24 MED ORDER — REGADENOSON 0.4 MG/5ML IV SOLN
0.4000 mg | Freq: Once | INTRAVENOUS | Status: AC
  Administered 2015-12-24: 0.4 mg via INTRAVENOUS

## 2015-12-24 MED ORDER — TECHNETIUM TC 99M TETROFOSMIN IV KIT
10.8000 | PACK | Freq: Once | INTRAVENOUS | Status: AC | PRN
  Administered 2015-12-24: 10.8 via INTRAVENOUS
  Filled 2015-12-24: qty 11

## 2015-12-24 MED ORDER — TECHNETIUM TC 99M TETROFOSMIN IV KIT
30.8000 | PACK | Freq: Once | INTRAVENOUS | Status: AC | PRN
  Administered 2015-12-24: 30.8 via INTRAVENOUS
  Filled 2015-12-24: qty 31

## 2015-12-24 MED ORDER — AMINOPHYLLINE 25 MG/ML IV SOLN
75.0000 mg | Freq: Once | INTRAVENOUS | Status: AC
  Administered 2015-12-24: 75 mg via INTRAVENOUS

## 2016-01-01 ENCOUNTER — Ambulatory Visit: Payer: Medicare Other | Admitting: Cardiovascular Disease

## 2016-01-14 ENCOUNTER — Encounter: Payer: Self-pay | Admitting: Cardiovascular Disease

## 2016-01-14 ENCOUNTER — Ambulatory Visit (INDEPENDENT_AMBULATORY_CARE_PROVIDER_SITE_OTHER): Payer: Medicare Other | Admitting: Cardiovascular Disease

## 2016-01-14 VITALS — BP 130/64 | HR 83 | Ht 63.0 in | Wt 131.0 lb

## 2016-01-14 DIAGNOSIS — R0609 Other forms of dyspnea: Secondary | ICD-10-CM | POA: Diagnosis not present

## 2016-01-14 NOTE — Assessment & Plan Note (Signed)
Sierra [name] returns today for follow-up of her outpatient diagnostic tests performed to evaluate her new onset dyspnea on exertion. A 2-D echo is essentially normal as was a Myoview stress test. She doesn't give a occasional atypical chest pain. She recently moved into a new living environment and this may be related to some stress. I will see her back in 4 months. If she is to have symptoms I will consider doing outpatient cardiac catheterization.

## 2016-01-14 NOTE — Progress Notes (Signed)
Sierra [name] returns today for follow-up of her outpatient diagnostic tests performed to evaluate her new onset dyspnea on exertion. A 2-D echo is essentially normal as was a Myoview stress test. She doesn't give a occasional atypical chest pain. She recently moved into a new living environment and this may be related to some stress. I will see her back in 4 months. If she is to have symptoms I will consider doing outpatient cardiac catheterization.

## 2016-01-14 NOTE — Patient Instructions (Signed)
Medication Instructions:  Your physician recommends that you continue on your current medications as directed. Please refer to the Current Medication list given to you today.   Follow-Up: Your physician wants you to follow-up in: 5 MONTHS (IN January 2018) WITH DR BERRY. You will receive a reminder letter in the mail two months in advance. If you don't receive a letter, please call our office to schedule the follow-up appointment.   If you need a refill on your cardiac medications before your next appointment, please call your pharmacy.

## 2016-02-17 ENCOUNTER — Telehealth: Payer: Self-pay | Admitting: Internal Medicine

## 2016-02-17 NOTE — Telephone Encounter (Signed)
Spoke with patient-she wanted to make OV with CY in Feb 2018 to ensure she will have appt on the books. Pt is not sick at this time.

## 2016-06-10 ENCOUNTER — Telehealth: Payer: Self-pay | Admitting: Physician Assistant

## 2016-06-10 ENCOUNTER — Emergency Department (HOSPITAL_COMMUNITY): Payer: Medicare Other

## 2016-06-10 ENCOUNTER — Emergency Department (HOSPITAL_COMMUNITY)
Admission: EM | Admit: 2016-06-10 | Discharge: 2016-06-10 | Disposition: A | Discharge status: Home / Self Care | Payer: Medicare Other | Source: Home / Self Care | Attending: Emergency Medicine | Admitting: Emergency Medicine

## 2016-06-10 ENCOUNTER — Encounter (HOSPITAL_COMMUNITY): Payer: Self-pay | Admitting: Emergency Medicine

## 2016-06-10 DIAGNOSIS — E785 Hyperlipidemia, unspecified: Secondary | ICD-10-CM | POA: Diagnosis not present

## 2016-06-10 DIAGNOSIS — K5190 Ulcerative colitis, unspecified, without complications: Secondary | ICD-10-CM | POA: Diagnosis present

## 2016-06-10 DIAGNOSIS — Z90710 Acquired absence of both cervix and uterus: Secondary | ICD-10-CM

## 2016-06-10 DIAGNOSIS — Z96643 Presence of artificial hip joint, bilateral: Secondary | ICD-10-CM

## 2016-06-10 DIAGNOSIS — K219 Gastro-esophageal reflux disease without esophagitis: Secondary | ICD-10-CM | POA: Diagnosis present

## 2016-06-10 DIAGNOSIS — Z882 Allergy status to sulfonamides status: Secondary | ICD-10-CM

## 2016-06-10 DIAGNOSIS — Z79899 Other long term (current) drug therapy: Secondary | ICD-10-CM

## 2016-06-10 DIAGNOSIS — E2740 Unspecified adrenocortical insufficiency: Secondary | ICD-10-CM | POA: Diagnosis not present

## 2016-06-10 DIAGNOSIS — E872 Acidosis: Secondary | ICD-10-CM | POA: Diagnosis present

## 2016-06-10 DIAGNOSIS — Z881 Allergy status to other antibiotic agents status: Secondary | ICD-10-CM

## 2016-06-10 DIAGNOSIS — J45909 Unspecified asthma, uncomplicated: Secondary | ICD-10-CM | POA: Insufficient documentation

## 2016-06-10 DIAGNOSIS — J45901 Unspecified asthma with (acute) exacerbation: Secondary | ICD-10-CM | POA: Diagnosis not present

## 2016-06-10 DIAGNOSIS — R05 Cough: Secondary | ICD-10-CM

## 2016-06-10 DIAGNOSIS — I10 Essential (primary) hypertension: Secondary | ICD-10-CM | POA: Insufficient documentation

## 2016-06-10 DIAGNOSIS — R0600 Dyspnea, unspecified: Secondary | ICD-10-CM | POA: Diagnosis present

## 2016-06-10 DIAGNOSIS — J9601 Acute respiratory failure with hypoxia: Secondary | ICD-10-CM

## 2016-06-10 DIAGNOSIS — J069 Acute upper respiratory infection, unspecified: Secondary | ICD-10-CM

## 2016-06-10 DIAGNOSIS — N182 Chronic kidney disease, stage 2 (mild): Secondary | ICD-10-CM | POA: Diagnosis present

## 2016-06-10 DIAGNOSIS — R739 Hyperglycemia, unspecified: Secondary | ICD-10-CM | POA: Diagnosis not present

## 2016-06-10 DIAGNOSIS — I129 Hypertensive chronic kidney disease with stage 1 through stage 4 chronic kidney disease, or unspecified chronic kidney disease: Secondary | ICD-10-CM | POA: Diagnosis not present

## 2016-06-10 DIAGNOSIS — Z888 Allergy status to other drugs, medicaments and biological substances status: Secondary | ICD-10-CM

## 2016-06-10 DIAGNOSIS — R059 Cough, unspecified: Secondary | ICD-10-CM

## 2016-06-10 DIAGNOSIS — J312 Chronic pharyngitis: Secondary | ICD-10-CM | POA: Diagnosis present

## 2016-06-10 DIAGNOSIS — Z885 Allergy status to narcotic agent status: Secondary | ICD-10-CM

## 2016-06-10 DIAGNOSIS — E039 Hypothyroidism, unspecified: Secondary | ICD-10-CM

## 2016-06-10 DIAGNOSIS — Z7951 Long term (current) use of inhaled steroids: Secondary | ICD-10-CM

## 2016-06-10 DIAGNOSIS — T380X5A Adverse effect of glucocorticoids and synthetic analogues, initial encounter: Secondary | ICD-10-CM | POA: Diagnosis present

## 2016-06-10 DIAGNOSIS — Z96649 Presence of unspecified artificial hip joint: Secondary | ICD-10-CM | POA: Diagnosis present

## 2016-06-10 DIAGNOSIS — J209 Acute bronchitis, unspecified: Secondary | ICD-10-CM | POA: Diagnosis present

## 2016-06-10 DIAGNOSIS — E0590 Thyrotoxicosis, unspecified without thyrotoxic crisis or storm: Secondary | ICD-10-CM | POA: Diagnosis not present

## 2016-06-10 DIAGNOSIS — Z86011 Personal history of benign neoplasm of the brain: Secondary | ICD-10-CM

## 2016-06-10 DIAGNOSIS — Z7952 Long term (current) use of systemic steroids: Secondary | ICD-10-CM

## 2016-06-10 DIAGNOSIS — J101 Influenza due to other identified influenza virus with other respiratory manifestations: Secondary | ICD-10-CM | POA: Diagnosis not present

## 2016-06-10 LAB — CBC WITH DIFFERENTIAL/PLATELET
Basophils Absolute: 0 10*3/uL (ref 0.0–0.1)
Basophils Relative: 0 %
EOS ABS: 0.1 10*3/uL (ref 0.0–0.7)
EOS PCT: 1 %
HCT: 38.4 % (ref 36.0–46.0)
Hemoglobin: 12.5 g/dL (ref 12.0–15.0)
LYMPHS ABS: 0.8 10*3/uL (ref 0.7–4.0)
Lymphocytes Relative: 5 %
MCH: 28 pg (ref 26.0–34.0)
MCHC: 32.6 g/dL (ref 30.0–36.0)
MCV: 85.9 fL (ref 78.0–100.0)
MONO ABS: 0.8 10*3/uL (ref 0.1–1.0)
Monocytes Relative: 5 %
Neutro Abs: 12.9 10*3/uL — ABNORMAL HIGH (ref 1.7–7.7)
Neutrophils Relative %: 89 %
PLATELETS: 288 10*3/uL (ref 150–400)
RBC: 4.47 MIL/uL (ref 3.87–5.11)
RDW: 14.4 % (ref 11.5–15.5)
WBC: 14.6 10*3/uL — AB (ref 4.0–10.5)

## 2016-06-10 LAB — BASIC METABOLIC PANEL
Anion gap: 12 (ref 5–15)
BUN: 17 mg/dL (ref 6–20)
CALCIUM: 9.1 mg/dL (ref 8.9–10.3)
CO2: 22 mmol/L (ref 22–32)
CREATININE: 1.02 mg/dL — AB (ref 0.44–1.00)
Chloride: 102 mmol/L (ref 101–111)
GFR calc Af Amer: 56 mL/min — ABNORMAL LOW (ref >60)
GFR, EST NON AFRICAN AMERICAN: 49 mL/min — AB (ref >60)
GLUCOSE: 138 mg/dL — AB (ref 65–99)
POTASSIUM: 3.4 mmol/L — AB (ref 3.5–5.1)
SODIUM: 136 mmol/L (ref 135–145)

## 2016-06-10 LAB — INFLUENZA PANEL BY PCR (TYPE A & B)
INFLAPCR: POSITIVE — AB
Influenza B By PCR: NEGATIVE

## 2016-06-10 MED ORDER — METHYLPREDNISOLONE SODIUM SUCC 125 MG IJ SOLR
125.0000 mg | Freq: Once | INTRAMUSCULAR | Status: AC
  Administered 2016-06-10: 125 mg via INTRAVENOUS
  Filled 2016-06-10: qty 2

## 2016-06-10 MED ORDER — IPRATROPIUM-ALBUTEROL 0.5-2.5 (3) MG/3ML IN SOLN
3.0000 mL | Freq: Once | RESPIRATORY_TRACT | Status: AC
  Administered 2016-06-10: 3 mL via RESPIRATORY_TRACT
  Filled 2016-06-10: qty 3

## 2016-06-10 MED ORDER — BENZONATATE 100 MG PO CAPS: 100.0000 mg | ORAL_CAPSULE | Freq: Three times a day (TID) | ORAL | 0 refills | Status: DC | PRN

## 2016-06-10 MED ORDER — ONDANSETRON 4 MG PO TBDP: 4.0000 mg | ORAL_TABLET | Freq: Three times a day (TID) | ORAL | 0 refills | Status: DC | PRN

## 2016-06-10 MED ORDER — MAGNESIUM SULFATE 2 GM/50ML IV SOLN
2.0000 g | Freq: Once | INTRAVENOUS | Status: AC
  Administered 2016-06-10: 2 g via INTRAVENOUS
  Filled 2016-06-10: qty 50

## 2016-06-10 MED ORDER — PREDNISONE 20 MG PO TABS: 20.0000 mg | ORAL_TABLET | Freq: Two times a day (BID) | ORAL | 0 refills | Status: DC

## 2016-06-10 MED ORDER — ALBUTEROL SULFATE (2.5 MG/3ML) 0.083% IN NEBU: 2.5000 mg | INHALATION_SOLUTION | Freq: Four times a day (QID) | RESPIRATORY_TRACT | 0 refills | Status: DC | PRN

## 2016-06-10 MED FILL — ALBUTEROL 0.083% INHAL SOLN: (2.5 MG/3ML | 7 days supply | Qty: 90 | Fill #0

## 2016-06-10 MED FILL — ONDANSETRON ODT 4 MG TABLET: 4 | 4 days supply | Qty: 12 | Fill #0

## 2016-06-10 MED FILL — BENZONATATE 100 MG CAPSULE: 100 | 7 days supply | Qty: 21 | Fill #0

## 2016-06-10 MED FILL — predniSONE 20 MG TABS: 20 | 5 days supply | Qty: 10 | Fill #0

## 2016-06-10 NOTE — Telephone Encounter (Signed)
Received records from [facility] for appointment on 06/12/16 with Rosaria Ferries, PA.  Records put with Rhonda's schedule for 06/12/16. lp

## 2016-06-10 NOTE — ED Triage Notes (Signed)
Per EMS patient from home for productive cough and fever for 2 days.  Patient was given Albuterol 2.5mg  and Atrovent 0.5mg  by fire at scene.  EMS gave tylenol 1000mg  and Zofran 4mg  in route.  Patient has 20 left forearm.  Vitals: 101 temperal, 116/60, 93HR, 20R 96%.

## 2016-06-10 NOTE — ED Notes (Signed)
Bed: WA23 Expected date:  Expected time:  Means of arrival:  Comments: 

## 2016-06-10 NOTE — ED Notes (Signed)
Bed: [facility] Expected date:  Expected time:  Means of arrival:  Comments: 

## 2016-06-10 NOTE — Discharge Instructions (Signed)
It was my pleasure taking care of you today!  Take 20mg  of Prednisone twice a day starting tomorrow morning. Do this for 5 days, then return to your usual home dose. Tessalon as needed for cough. Zofran as needed for nausea. Tylenol as needed for fever. I have also refilled your albuterol nebulizer solution. Please follow up with your primary care provider or pulmonologist for discussion of today's hospital visit.  Return to ER for difficulty breathing, uncontrolled fevers, new or worsening symptoms, any additional concerns.

## 2016-06-10 NOTE — ED Provider Notes (Signed)
[facility] DEPT Provider Note   CSN: [id number] Arrival date & time: 06/10/16  1034     History   Chief Complaint Chief Complaint  Patient presents with  . Fever  . Cough    HPI Sierra [name] is a 83 y.o. female.  The history is provided by the patient and medical records. No language interpreter was used.   Sierra [name] is a 83 y.o. female  with a PMH of asthma followed by pulmonology Dr. Annamaria Boots who presents to the Emergency Department complaining of worsening productive cough and congestion x 2 days. Associated symptoms include subjective fever at home - 101 temporal per EMS. Husband had URI symptoms last week which have now resolved. 2.5 mg of albuterol, 0.5 mg Atrovent, 1000 milligrams of Tylenol and 4 mg Zofran given in route by EMS.     Past Medical History:  Diagnosis Date  . Acute asthmatic bronchitis   . Allergic rhinitis   . Allergic rhinitis   . Arthritis    HX OF RT SHOULDER BURSITIS-AND THE SHOULDER IS HURTING AT PRESENT, PAIN IN LEFT KNEE AT PRESENT  AND PAIN IN LEFT HIP  . Arthritis   . Asthmatic bronchitis   . Balance problem    SINCE BRAIN TUMOR REMOVED IN 2002-BENIGN TUMOR-PT HAS ADRENAL INSUFFICIENCY AND TAKES DAILY PREDNISONE  . Balance problem   . Blood transfusion   . Cardiac murmur    DOES NOT CAUSE ANY SYMPTOMS  . Chronic kidney disease   . Chronic pharyngitis   . Chronic pharyngitis   . Diverticulitis   . Diverticulitis, colon   . Dizziness   . DOE (dyspnea on exertion)   . Esophageal reflux   . Esophageal reflux   . GI bleed 01/06/2013  . GI bleed   . Hyperlipemia   . Hypertension   . Hypothyroidism   . IBS (irritable bowel syndrome)   . IBS (irritable bowel syndrome)   . Leukocytosis   . Leukocytosis   . Other specified iron deficiency anemias   . Recurrent upper respiratory infection (URI)    ON 07/10/11 PT SAW DR. Tarri Fuller YOUNG FOR TX OF ACUTE BRONCHITIS --GIVEN  EXTRA PREDNISONE ( IN ADDITION TO THE DAILY PREDNISONE PT  TAKES)  AND PT FINISHED A Z-PAK--STILL HAS A COUGH TODAY 07/15/11-NO FEVER.  . Thrombocytosis ([place])   . Thrombocytosis ([place])   . Ulceration, colon   . Ulcerative colitis     Patient Active Problem List   Diagnosis Date Noted  . Rectal bleeding 01/03/2014  . Diverticulosis 01/03/2014  . Adrenal insufficiency ([place]) 01/03/2014  . Ulcerative colitis, chronic ([place]) 01/03/2014  . Asthma with acute exacerbation 06/23/2013  . Acute blood loss anemia 01/06/2013  . LGI bleed 01/05/2013  . Malaise and fatigue 09/21/2011  . Benign hypertensive heart disease without heart failure 09/21/2011  . Atrial premature contractions 09/21/2011  . Postop Hyponatremia 07/28/2011  . Osteoarthritis of hip 07/27/2011  . PNEUMONIA 06/02/2010  . DYSPNEA ON EXERTION 05/30/2010  . ATYPICAL MYCOBACTERIAL INFECTION 05/05/2010  . HYPOTHYROIDISM 05/01/2010  . Ulcerative colitis ([place]) 03/02/2010  . HOARSENESS, CHRONIC 11/05/2009  . THRUSH 04/30/2009  . Asthma with bronchitis 04/30/2009  . HYPERLIPIDEMIA 07/11/2007  . ANEMIA, IRON DEFICIENCY, MICROCYTIC 07/11/2007  . LEUKOCYTOSIS 07/11/2007  . THROMBOCYTOSIS 07/11/2007  . Essential hypertension 07/11/2007  . Acute bronchitis 07/11/2007  . PHARYNGITIS, CHRONIC 07/11/2007  . Nonallergic vasomotor rhinitis 07/11/2007  . Esophageal reflux 07/11/2007  . DIVERTICULITIS, COLON 07/11/2007  . ARTHRITIS 07/11/2007  . CARDIAC  MURMUR 07/11/2007    Past Surgical History:  Procedure Laterality Date  . ABDOMINAL HYSTERECTOMY  1985  . APPENDECTOMY    . COLONOSCOPY N/A 01/05/2013   Procedure: COLONOSCOPY;  Surgeon: Cleotis Nipper, MD;  Location: [facility] ENDOSCOPY;  Service: Endoscopy;  Laterality: N/A;  . COLONOSCOPY N/A 01/04/2014   Procedure: COLONOSCOPY;  Surgeon: Winfield Cunas., MD;  Location: [facility] ENDOSCOPY;  Service: Endoscopy;  Laterality: N/A;  . CRANIOTOMY    . DILATION AND CURETTAGE OF UTERUS  1967  . ESOPHAGOGASTRODUODENOSCOPY N/A 01/05/2013   Procedure:  ESOPHAGOGASTRODUODENOSCOPY (EGD);  Surgeon: Cleotis Nipper, MD;  Location: [facility] ENDOSCOPY;  Service: Endoscopy;  Laterality: N/A;  . ESOPHAGOGASTRODUODENOSCOPY N/A 06/26/2014   Procedure: ESOPHAGOGASTRODUODENOSCOPY (EGD);  Surgeon: Winfield Cunas., MD;  Location[facility] ENDOSCOPY;  Service: Endoscopy;  Laterality: N/A;  . EYE SURGERY     2003 -RIGHT CATARACT EXTRACTED AND LEFT WAS DONE IN 2004  . FLEXIBLE SIGMOIDOSCOPY N/A 06/26/2014   Procedure: FLEXIBLE SIGMOIDOSCOPY;  Surgeon: Winfield Cunas., MD;  Location: [facility] ENDOSCOPY;  Service: Endoscopy;  Laterality: N/A;  . FRONTALIS SUSPENSION  10-09-2010   lifting eyelids AND SECOND EYE SURGERY November 19, 2010  . meningioma resected  20O2  . PARTIAL COLECTOMY  2008  . subglottal mucocoel  2000  . THYROIDECTOMY  1986  . TONSILLECTOMY  1938  . TOTAL HIP ARTHROPLASTY  OCT 2006   right  . TOTAL HIP ARTHROPLASTY  07/27/2011   Procedure: TOTAL HIP ARTHROPLASTY;  Surgeon: Gearlean Alf, MD;  Location: [facility] ORS;  Service: Orthopedics;  Laterality: Left;  Marland Kitchen VESICOVAGINAL FISTULA CLOSURE W/ TAH    . WRIST TENDON LESION REMOVED 2007      OB History    No data available       Home Medications    Prior to Admission medications   Medication Sig Start Date End Date Taking? Authorizing Provider  albuterol (PROVENTIL HFA;VENTOLIN HFA) 108 (90 BASE) MCG/ACT inhaler Inhale 2 puffs into the lungs every 6 (six) hours as needed for wheezing or shortness of breath. 04/29/12 08/19/16 Yes Clinton D Young, MD  amLODipine (NORVASC) 2.5 MG tablet Take 2.5 mg by mouth daily after breakfast.   Yes Historical Provider, MD  Artificial Tear Solution (BION TEARS) 0.1-0.3 % SOLN Place 1 drop into both eyes every morning.    Yes Historical Provider, MD  Artificial Tear Solution (GENTEAL TEARS) 0.1-0.2-0.3 % SOLN Place 1 drop into both eyes at bedtime.   Yes Historical Provider, MD  B Complex Vitamins (VITAMIN B COMPLEX PO) Take 1 tablet by mouth daily.   Yes Historical  Provider, MD  BIOTIN PO Take 1 tablet by mouth daily.   Yes Historical Provider, MD  cholecalciferol (VITAMIN D) 1000 UNITS tablet Take 1,000 Units by mouth daily.   Yes Historical Provider, MD  Cyanocobalamin (VITAMIN B-12) 2500 MCG SUBL Place 2,500 mcg under the tongue daily.   Yes Historical Provider, MD  famotidine (PEPCID) 20 MG tablet Take 20 mg by mouth daily.    Yes Historical Provider, MD  fluticasone (FLOVENT HFA) 110 MCG/ACT inhaler Inhale 2 puffs into the lungs 2 (two) times daily.   Yes Historical Provider, MD  lactase (LACTAID) 3000 UNITS tablet Take 3,000 Units by mouth daily as needed (lactose intolerant).   Yes Historical Provider, MD  levothyroxine (SYNTHROID, LEVOTHROID) 75 MCG tablet Take 75 mcg by mouth daily before breakfast.    Yes Historical Provider, MD  LORazepam (ATIVAN) 0.5 MG tablet Take 0.5 mg  by mouth at bedtime as needed for sleep.    Yes Historical Provider, MD  Multiple Vitamin (MULTIVITAMIN) tablet Take 1 tablet by mouth daily.   Yes Historical Provider, MD  Probiotic Product (PROBIOTIC DAILY PO) Take 1 tablet by mouth daily.   Yes Historical Provider, MD  albuterol (PROVENTIL) (2.5 MG/3ML) 0.083% nebulizer solution Take 3 mLs (2.5 mg total) by nebulization every 6 (six) hours as needed for wheezing or shortness of breath. 06/10/16   Jaime Pilcher Ward, PA-C  benzonatate (TESSALON) 100 MG capsule Take 1 capsule (100 mg total) by mouth 3 (three) times daily as needed for cough. 06/10/16   Ozella Almond Ward, PA-C  ondansetron (ZOFRAN ODT) 4 MG disintegrating tablet Take 1 tablet (4 mg total) by mouth every 8 (eight) hours as needed for nausea or vomiting. 06/10/16   Ozella Almond Ward, PA-C  predniSONE (DELTASONE) 20 MG tablet Take 1 tablet (20 mg total) by mouth 2 (two) times daily. 06/10/16   [place], PA-C    Family History Family History  Problem Relation Age of Onset  . Heart attack Father     deceased    Social History Social History  Substance  Use Topics  . Smoking status: Never Smoker  . Smokeless tobacco: Never Used  . Alcohol use Yes     Comment: occ glass of wine     Allergies   Biaxin [clarithromycin]; Lactose intolerance (gi); Ciprofloxacin; Codeine; Dilantin [phenytoin]; Doxycycline; Restasis [cyclosporine]; and Sulfonamide derivatives   Review of Systems Review of Systems  Constitutional: Positive for fever.  HENT: Positive for congestion.   Eyes: Negative for visual disturbance.  Respiratory: Positive for cough and wheezing. Negative for shortness of breath.   Cardiovascular: Negative.   Gastrointestinal: Positive for nausea. Negative for abdominal pain and vomiting.  Genitourinary: Negative for dysuria.  Musculoskeletal: Negative for neck pain.  Skin: Negative for rash.  Neurological: Negative for headaches.     Physical Exam Updated Vital Signs BP 123/57   Pulse 101   Temp 99.4 F (37.4 C) (Rectal)   Resp 16   Ht 5' 3.5" (1.613 m)   Wt 56.7 kg   SpO2 93%   BMI 21.80 kg/m   Physical Exam  Constitutional: She is oriented to person, place, and time. She appears well-developed and well-nourished. No distress.  HENT:  Head: Normocephalic and atraumatic.  Cardiovascular: Normal rate, regular rhythm and normal heart sounds.   No murmur heard. Pulmonary/Chest: Effort normal. No respiratory distress. She has wheezes.  Abdominal: Soft. She exhibits no distension. There is no tenderness.  Musculoskeletal: She exhibits no edema.  Neurological: She is alert and oriented to person, place, and time.  Skin: Skin is warm and dry.  Nursing note and vitals reviewed.    ED Treatments / Results  Labs (all labs ordered are listed, but only abnormal results are displayed) Labs Reviewed  CBC WITH DIFFERENTIAL/PLATELET - Abnormal; Notable for the following:       Result Value   WBC 14.6 (*)    Neutro Abs 12.9 (*)    All other components within normal limits  BASIC METABOLIC PANEL - Abnormal; Notable for  the following:    Potassium 3.4 (*)    Glucose, Bld 138 (*)    Creatinine, Ser 1.02 (*)    GFR calc non Af Amer 49 (*)    GFR calc Af Amer 56 (*)    All other components within normal limits  INFLUENZA PANEL BY PCR (TYPE A & B)  EKG  EKG Interpretation None       Radiology Dg Chest 2 View  Result Date: 06/10/2016 CLINICAL DATA:  Worsening cough.  Fever.  Dyspnea. EXAM: CHEST  2 VIEW COMPARISON:  10/25/2015 chest radiograph. FINDINGS: Stable cardiomediastinal silhouette with top-normal heart size and aortic atherosclerosis. No pneumothorax. No pleural effusion. Lungs appear clear, with no acute consolidative airspace disease and no pulmonary edema. IMPRESSION: No active cardiopulmonary disease. Electronically Signed   By: Ilona Sorrel M.D.   On: 06/10/2016 11:48    Procedures Procedures (including critical care time)  Medications Ordered in ED Medications  ipratropium-albuterol (DUONEB) 0.5-2.5 (3) MG/3ML nebulizer solution 3 mL (3 mLs Nebulization Given 06/10/16 1201)  methylPREDNISolone sodium succinate (SOLU-MEDROL) 125 mg/2 mL injection 125 mg (125 mg Intravenous Given 06/10/16 1216)  ipratropium-albuterol (DUONEB) 0.5-2.5 (3) MG/3ML nebulizer solution 3 mL (3 mLs Nebulization Given 06/10/16 1404)  magnesium sulfate IVPB 2 g 50 mL (2 g Intravenous New Bag/Given 06/10/16 1441)     Initial Impression / Assessment and Plan / ED Course  I have reviewed the triage vital signs and the nursing notes.  Pertinent labs & imaging results that were available during my care of the patient were reviewed by me and considered in my medical decision making (see chart for details).    Sierra [name] is a 83 y.o. female who presents to ED for fever, cough, congestion x 2 days. Temperature of 99.4 upon arrival. HR in the 90's. BP reassuring. Patient is non-toxic appearing on exam. She is maintaining O2 94-97% on RA. Speaking in full sentences without difficulty. Lungs with wheezing and  congestion. CXR shows no acute cardiopulmonary disease. CBC with white count of 14.6. She is on 5mg  prednisone daily. BMP reassuring. Duoneb x 2, solu-medrol and 2g mag given in ED. Patient re-evaluated and feels improved. She is maintaining O2 > 92% on RA throughout ED stay. Offered admission vs. Home with strict return precautions and close follow up with her pulmonologist. Patient prefers to be discharged to home. Stressed return precautions and importance of follow up. On 5mg  prednisone daily - will give burst of 20mg  BID x 5 days then return to usual home dose. Refilled albuterol neb rx. Rx for tessalon and zofran given. All questions answered.   Patient seen by and discussed with Dr. Stark Jock who agrees with treatment plan.   Addendum: several hours after discharge, influenza A PCR resulted positive. I called Sierra [name] to inform her of results. Tamiflu rx called in to [facility] at her request. She states that she has scheduled a follow up appointment with pulmonology on Monday. Again discussed symptomatic home care and all questions were answered.   Final Clinical Impressions(s) / ED Diagnoses   Final diagnoses:  Cough  Upper respiratory tract infection, unspecified type    New Prescriptions New Prescriptions   ALBUTEROL (PROVENTIL) (2.5 MG/3ML) 0.083% NEBULIZER SOLUTION    Take 3 mLs (2.5 mg total) by nebulization every 6 (six) hours as needed for wheezing or shortness of breath.   BENZONATATE (TESSALON) 100 MG CAPSULE    Take 1 capsule (100 mg total) by mouth 3 (three) times daily as needed for cough.   ONDANSETRON (ZOFRAN ODT) 4 MG DISINTEGRATING TABLET    Take 1 tablet (4 mg total) by mouth every 8 (eight) hours as needed for nausea or vomiting.   PREDNISONE (DELTASONE) 20 MG TABLET    Take 1 tablet (20 mg total) by mouth 2 (two) times daily.  Ozella Almond Ward, PA-C 06/10/16 [place], PA-C 06/10/16 [place], MD 06/11/16 [phone]

## 2016-06-11 ENCOUNTER — Encounter: Payer: Self-pay | Admitting: Acute Care

## 2016-06-11 ENCOUNTER — Encounter (HOSPITAL_COMMUNITY): Payer: Self-pay

## 2016-06-11 ENCOUNTER — Ambulatory Visit (INDEPENDENT_AMBULATORY_CARE_PROVIDER_SITE_OTHER): Payer: Medicare Other | Admitting: Acute Care

## 2016-06-11 ENCOUNTER — Inpatient Hospital Stay (HOSPITAL_COMMUNITY)
Admission: AD | Admit: 2016-06-11 | Discharge: 2016-06-15 | DRG: 193 | Disposition: A | Discharge status: Home / Self Care | Payer: Medicare Other | Source: Ambulatory Visit | Attending: Internal Medicine | Admitting: Internal Medicine

## 2016-06-11 ENCOUNTER — Inpatient Hospital Stay (HOSPITAL_COMMUNITY): Payer: Medicare Other

## 2016-06-11 VITALS — BP 128/70 | HR 121 | Temp 98.7°F | Ht 63.5 in | Wt 129.2 lb

## 2016-06-11 DIAGNOSIS — E0590 Thyrotoxicosis, unspecified without thyrotoxic crisis or storm: Secondary | ICD-10-CM | POA: Diagnosis not present

## 2016-06-11 DIAGNOSIS — J09X2 Influenza due to identified novel influenza A virus with other respiratory manifestations: Secondary | ICD-10-CM | POA: Diagnosis not present

## 2016-06-11 DIAGNOSIS — R062 Wheezing: Secondary | ICD-10-CM | POA: Diagnosis not present

## 2016-06-11 DIAGNOSIS — J209 Acute bronchitis, unspecified: Secondary | ICD-10-CM | POA: Diagnosis not present

## 2016-06-11 DIAGNOSIS — Z90710 Acquired absence of both cervix and uterus: Secondary | ICD-10-CM | POA: Diagnosis not present

## 2016-06-11 DIAGNOSIS — J45901 Unspecified asthma with (acute) exacerbation: Secondary | ICD-10-CM | POA: Diagnosis not present

## 2016-06-11 DIAGNOSIS — J101 Influenza due to other identified influenza virus with other respiratory manifestations: Secondary | ICD-10-CM | POA: Insufficient documentation

## 2016-06-11 DIAGNOSIS — R0600 Dyspnea, unspecified: Secondary | ICD-10-CM

## 2016-06-11 DIAGNOSIS — K5190 Ulcerative colitis, unspecified, without complications: Secondary | ICD-10-CM | POA: Diagnosis not present

## 2016-06-11 DIAGNOSIS — J9601 Acute respiratory failure with hypoxia: Secondary | ICD-10-CM

## 2016-06-11 DIAGNOSIS — K219 Gastro-esophageal reflux disease without esophagitis: Secondary | ICD-10-CM | POA: Diagnosis not present

## 2016-06-11 DIAGNOSIS — R069 Unspecified abnormalities of breathing: Secondary | ICD-10-CM

## 2016-06-11 DIAGNOSIS — N182 Chronic kidney disease, stage 2 (mild): Secondary | ICD-10-CM | POA: Diagnosis not present

## 2016-06-11 DIAGNOSIS — E785 Hyperlipidemia, unspecified: Secondary | ICD-10-CM | POA: Diagnosis not present

## 2016-06-11 DIAGNOSIS — E039 Hypothyroidism, unspecified: Secondary | ICD-10-CM | POA: Diagnosis not present

## 2016-06-11 DIAGNOSIS — J312 Chronic pharyngitis: Secondary | ICD-10-CM | POA: Diagnosis not present

## 2016-06-11 DIAGNOSIS — Z96649 Presence of unspecified artificial hip joint: Secondary | ICD-10-CM | POA: Diagnosis not present

## 2016-06-11 DIAGNOSIS — I129 Hypertensive chronic kidney disease with stage 1 through stage 4 chronic kidney disease, or unspecified chronic kidney disease: Secondary | ICD-10-CM | POA: Diagnosis not present

## 2016-06-11 DIAGNOSIS — R0989 Other specified symptoms and signs involving the circulatory and respiratory systems: Secondary | ICD-10-CM

## 2016-06-11 DIAGNOSIS — E2740 Unspecified adrenocortical insufficiency: Secondary | ICD-10-CM | POA: Diagnosis not present

## 2016-06-11 DIAGNOSIS — E872 Acidosis: Secondary | ICD-10-CM | POA: Diagnosis not present

## 2016-06-11 DIAGNOSIS — T380X5A Adverse effect of glucocorticoids and synthetic analogues, initial encounter: Secondary | ICD-10-CM | POA: Diagnosis not present

## 2016-06-11 DIAGNOSIS — R739 Hyperglycemia, unspecified: Secondary | ICD-10-CM | POA: Diagnosis not present

## 2016-06-11 HISTORY — DX: Acute respiratory failure with hypoxia: J96.01

## 2016-06-11 LAB — COMPREHENSIVE METABOLIC PANEL
ALK PHOS: 40 U/L (ref 38–126)
ALT: 20 U/L (ref 14–54)
AST: 37 U/L (ref 15–41)
Albumin: 4.2 g/dL (ref 3.5–5.0)
Anion gap: 12 (ref 5–15)
BILIRUBIN TOTAL: 0.4 mg/dL (ref 0.3–1.2)
BUN: 33 mg/dL — AB (ref 6–20)
CO2: 25 mmol/L (ref 22–32)
CREATININE: 1.46 mg/dL — AB (ref 0.44–1.00)
Calcium: 9.9 mg/dL (ref 8.9–10.3)
Chloride: 99 mmol/L — ABNORMAL LOW (ref 101–111)
GFR calc Af Amer: 37 mL/min — ABNORMAL LOW (ref >60)
GFR calc non Af Amer: 32 mL/min — ABNORMAL LOW (ref >60)
Glucose, Bld: 158 mg/dL — ABNORMAL HIGH (ref 65–99)
Potassium: 4.6 mmol/L (ref 3.5–5.1)
Sodium: 136 mmol/L (ref 135–145)
TOTAL PROTEIN: 7.8 g/dL (ref 6.5–8.1)

## 2016-06-11 LAB — BLOOD GAS, ARTERIAL
Acid-base deficit: 4.2 mmol/L — ABNORMAL HIGH (ref 0.0–2.0)
BICARBONATE: 18.7 mmol/L — AB (ref 20.0–28.0)
Drawn by: 257701
O2 SAT: 97.5 %
PATIENT TEMPERATURE: 37
pCO2 arterial: 29.2 mmHg — ABNORMAL LOW (ref 32.0–48.0)
pH, Arterial: 7.423 (ref 7.350–7.450)
pO2, Arterial: 96.2 mmHg (ref 83.0–108.0)

## 2016-06-11 LAB — PROCALCITONIN: Procalcitonin: 0.21 ng/mL

## 2016-06-11 LAB — CBC WITH DIFFERENTIAL/PLATELET
BASOS PCT: 0 %
Basophils Absolute: 0 10*3/uL (ref 0.0–0.1)
EOS ABS: 0 10*3/uL (ref 0.0–0.7)
Eosinophils Relative: 0 %
HEMATOCRIT: 35 % — AB (ref 36.0–46.0)
Hemoglobin: 11.5 g/dL — ABNORMAL LOW (ref 12.0–15.0)
Lymphocytes Relative: 2 %
Lymphs Abs: 0.3 10*3/uL — ABNORMAL LOW (ref 0.7–4.0)
MCH: 28.2 pg (ref 26.0–34.0)
MCHC: 32.9 g/dL (ref 30.0–36.0)
MCV: 85.8 fL (ref 78.0–100.0)
MONO ABS: 0.7 10*3/uL (ref 0.1–1.0)
MONOS PCT: 4 %
NEUTROS ABS: 18.9 10*3/uL — AB (ref 1.7–7.7)
Neutrophils Relative %: 94 %
Platelets: 298 10*3/uL (ref 150–400)
RBC: 4.08 MIL/uL (ref 3.87–5.11)
RDW: 14.7 % (ref 11.5–15.5)
WBC: 19.9 10*3/uL — ABNORMAL HIGH (ref 4.0–10.5)

## 2016-06-11 LAB — TROPONIN I: Troponin I: <0.03 ng/mL (ref <0.03)

## 2016-06-11 LAB — LACTIC ACID, PLASMA
Lactic Acid, Venous: 5.1 mmol/L (ref 0.5–1.9)
Lactic Acid, Venous: 5.4 mmol/L (ref 0.5–1.9)

## 2016-06-11 LAB — MAGNESIUM: MAGNESIUM: 2.8 mg/dL — AB (ref 1.7–2.4)

## 2016-06-11 LAB — PHOSPHORUS: Phosphorus: 2.8 mg/dL (ref 2.5–4.6)

## 2016-06-11 LAB — MRSA PCR SCREENING: MRSA BY PCR: NEGATIVE

## 2016-06-11 MED ORDER — LEVALBUTEROL HCL 0.63 MG/3ML IN NEBU
0.6300 mg | INHALATION_SOLUTION | RESPIRATORY_TRACT | Status: DC
  Administered 2016-06-11 – 2016-06-15 (×24): 0.63 mg via RESPIRATORY_TRACT
  Filled 2016-06-11 (×24): qty 3

## 2016-06-11 MED ORDER — ONDANSETRON HCL 4 MG/2ML IJ SOLN: 4.0000 mg | Freq: Four times a day (QID) | INTRAMUSCULAR | Status: DC | PRN

## 2016-06-11 MED ORDER — CHLORHEXIDINE GLUCONATE 0.12 % MT SOLN
15.0000 mL | Freq: Two times a day (BID) | OROMUCOSAL | Status: DC
  Administered 2016-06-11 – 2016-06-15 (×8): 15 mL via OROMUCOSAL
  Filled 2016-06-11 (×9): qty 15

## 2016-06-11 MED ORDER — BENZONATATE 100 MG PO CAPS
100.0000 mg | ORAL_CAPSULE | Freq: Three times a day (TID) | ORAL | Status: DC | PRN
  Administered 2016-06-11: 100 mg via ORAL
  Filled 2016-06-11: qty 1

## 2016-06-11 MED ORDER — SODIUM CHLORIDE 0.9 % IV BOLUS (SEPSIS)
1000.0000 mL | Freq: Once | INTRAVENOUS | Status: AC
  Administered 2016-06-11: 1000 mL via INTRAVENOUS

## 2016-06-11 MED ORDER — SODIUM CHLORIDE 0.9% FLUSH
3.0000 mL | Freq: Two times a day (BID) | INTRAVENOUS | Status: DC
  Administered 2016-06-11 – 2016-06-15 (×6): 3 mL via INTRAVENOUS

## 2016-06-11 MED ORDER — HEPARIN SODIUM (PORCINE) 5000 UNIT/ML IJ SOLN
5000.0000 [IU] | Freq: Three times a day (TID) | INTRAMUSCULAR | Status: DC
  Administered 2016-06-11 – 2016-06-15 (×12): 5000 [IU] via SUBCUTANEOUS
  Filled 2016-06-11 (×12): qty 1

## 2016-06-11 MED ORDER — AMLODIPINE BESYLATE 5 MG PO TABS
2.5000 mg | ORAL_TABLET | Freq: Every day | ORAL | Status: DC
  Administered 2016-06-12 – 2016-06-15 (×4): 2.5 mg via ORAL
  Filled 2016-06-11 (×4): qty 1

## 2016-06-11 MED ORDER — POLYVINYL ALCOHOL 1.4 % OP SOLN
1.0000 [drp] | Freq: Every day | OPHTHALMIC | Status: DC
  Administered 2016-06-11 – 2016-06-14 (×4): 1 [drp] via OPHTHALMIC
  Filled 2016-06-11: qty 15

## 2016-06-11 MED ORDER — ACETAMINOPHEN 325 MG PO TABS: 650.0000 mg | ORAL_TABLET | Freq: Four times a day (QID) | ORAL | Status: DC | PRN

## 2016-06-11 MED ORDER — SODIUM CHLORIDE 0.9 % IV SOLN
INTRAVENOUS | Status: DC
  Administered 2016-06-11: 16:00:00 via INTRAVENOUS

## 2016-06-11 MED ORDER — ONDANSETRON HCL 4 MG PO TABS: 4.0000 mg | ORAL_TABLET | Freq: Four times a day (QID) | ORAL | Status: DC | PRN

## 2016-06-11 MED ORDER — ADULT MULTIVITAMIN W/MINERALS CH
1.0000 | ORAL_TABLET | Freq: Every day | ORAL | Status: DC
  Administered 2016-06-12 – 2016-06-15 (×4): 1 via ORAL
  Filled 2016-06-11 (×8): qty 1

## 2016-06-11 MED ORDER — PREDNISONE 20 MG PO TABS: 20.0000 mg | ORAL_TABLET | Freq: Two times a day (BID) | ORAL | Status: DC

## 2016-06-11 MED ORDER — ACETAMINOPHEN 650 MG RE SUPP: 650.0000 mg | Freq: Four times a day (QID) | RECTAL | Status: DC | PRN

## 2016-06-11 MED ORDER — ASPIRIN EC 81 MG PO TBEC
81.0000 mg | DELAYED_RELEASE_TABLET | Freq: Every day | ORAL | Status: DC
  Administered 2016-06-11 – 2016-06-14 (×4): 81 mg via ORAL
  Filled 2016-06-11 (×4): qty 1

## 2016-06-11 MED ORDER — METHYLPREDNISOLONE SODIUM SUCC 125 MG IJ SOLR
60.0000 mg | Freq: Four times a day (QID) | INTRAMUSCULAR | Status: DC
  Administered 2016-06-11 – 2016-06-12 (×4): 60 mg via INTRAVENOUS
  Filled 2016-06-11 (×4): qty 2

## 2016-06-11 MED ORDER — GUAIFENESIN ER 600 MG PO TB12
600.0000 mg | ORAL_TABLET | Freq: Two times a day (BID) | ORAL | Status: DC
  Administered 2016-06-11 – 2016-06-15 (×9): 600 mg via ORAL
  Filled 2016-06-11 (×9): qty 1

## 2016-06-11 MED ORDER — OSELTAMIVIR PHOSPHATE 30 MG PO CAPS
30.0000 mg | ORAL_CAPSULE | Freq: Two times a day (BID) | ORAL | Status: DC
  Administered 2016-06-11 – 2016-06-15 (×9): 30 mg via ORAL
  Filled 2016-06-11 (×10): qty 1

## 2016-06-11 MED ORDER — BUDESONIDE 0.25 MG/2ML IN SUSP
0.2500 mg | Freq: Two times a day (BID) | RESPIRATORY_TRACT | Status: DC
  Administered 2016-06-11 – 2016-06-12 (×2): 0.25 mg via RESPIRATORY_TRACT
  Filled 2016-06-11 (×2): qty 2

## 2016-06-11 MED ORDER — FAMOTIDINE 20 MG PO TABS
20.0000 mg | ORAL_TABLET | Freq: Every day | ORAL | Status: DC
  Administered 2016-06-11 – 2016-06-15 (×5): 20 mg via ORAL
  Filled 2016-06-11 (×5): qty 1

## 2016-06-11 MED ORDER — LEVOTHYROXINE SODIUM 75 MCG PO TABS
75.0000 ug | ORAL_TABLET | Freq: Every day | ORAL | Status: DC
  Administered 2016-06-12 – 2016-06-15 (×4): 75 ug via ORAL
  Filled 2016-06-11 (×4): qty 1

## 2016-06-11 MED ORDER — LORAZEPAM 0.5 MG PO TABS
0.5000 mg | ORAL_TABLET | Freq: Every evening | ORAL | Status: DC | PRN
  Administered 2016-06-14: 0.5 mg via ORAL
  Filled 2016-06-11: qty 1

## 2016-06-11 MED ORDER — LEVALBUTEROL HCL 0.63 MG/3ML IN NEBU
0.6300 mg | INHALATION_SOLUTION | Freq: Once | RESPIRATORY_TRACT | Status: AC
  Administered 2016-06-11: 0.63 mg via RESPIRATORY_TRACT

## 2016-06-11 MED ORDER — BISACODYL 10 MG RE SUPP: 10.0000 mg | Freq: Every day | RECTAL | Status: DC | PRN

## 2016-06-11 NOTE — Progress Notes (Signed)
CRITICAL VALUE ALERT  Critical value received:  Lactic Acid 5.4  Date of notification:  06-11-16  Time of notification:  1820  Critical value read back: yes  Nurse who received alert:  Henrietta Dine  MD notified (1st page):  Dr. Elsworth Soho  Time of first page:  1825  MD notified (2nd page):  Time of second page:  Responding MD:  Dr. Elsworth Soho  Time MD responded:  [phone]

## 2016-06-11 NOTE — Clinical Social Work Note (Signed)
Clinical Social Work Assessment  Patient Details  Name: Sierra [name] MRN: [id number] Date of Birth: 29-Nov-1928  Date of referral:  06/11/16               Reason for consult:                   Permission sought to share information with:    Permission granted to share information::     Name::      Roxanna Mew Dds  Agency::   [facility]  Relationship::   Spouse  Contact Information:    [phone]  Housing/Transportation Living arrangements for the past 2 months:  Charity fundraiser of Information:  Patient Patient Interpreter Needed:  None Criminal Activity/Legal Involvement Pertinent to Current Situation/Hospitalization:  No - Comment as needed Significant Relationships:  Spouse, Adult Children Lives with:  Spouse Do you feel safe going back to the place where you live?  Yes Need for family participation in patient care:  Yes (Comment)  Care giving concerns:  No concerns identified at this time. Patient is from independent living [facility]    Social Worker assessment / plan: LCSWA met with patient at bedside, explained role and reason for consult. Patient reports she lives in a independent  [facility] with her spouse.  Plan: Return to independent living.   Employment status:  Retired Nurse, adult PT Recommendations:  Not assessed at this time Information / Referral to community resources:     Patient/Family's Response to care:  Responding and Agreeable to care.   Patient/Family's Understanding of and Emotional Response to Diagnosis, Current Treatment, and Prognosis:  No family at bedside during visit.   Emotional Assessment Appearance:    Attitude/Demeanor/Rapport:    Affect (typically observed):  Accepting, Calm Orientation:  Oriented to Self, Oriented to Place, Oriented to  Time, Oriented to Situation Alcohol / Substance use:  Not Applicable Psych involvement (Current and  /or in the community):  No (Comment)  Discharge Needs  Concerns to be addressed:  No discharge needs identified Readmission within the last 30 days:  No Current discharge risk:  None Barriers to Discharge:  No Barriers Identified   Lia Hopping, LCSW 06/11/2016, 2:24 PM

## 2016-06-11 NOTE — Assessment & Plan Note (Addendum)
Acute flare 2/2 Influenza A High Risk for Pneumonia and pulmonary complications Plan: Admit to [facility] Telemetry See hospital H&P for plan of care

## 2016-06-11 NOTE — Progress Notes (Signed)
[facility] Progress Note Patient Name: Sierra [name] DOB: [birth year] MRN: [id number]   Date of Service  06/11/2016  HPI/Events of Note  Lactate high & rising BP ok, Satn ok Unclear sig ? dry  eICU Interventions  NS bolus & increase IVFs to 75/h & recheck in am     Intervention Category Intermediate Interventions: Diagnostic test evaluation  [name] V. 06/11/2016, 6:23 PM

## 2016-06-11 NOTE — Progress Notes (Signed)
History of Present Illness Sierra [name] is a 83 y.o. female with asthma complicated by allergic rhinitis and bronchitis, GERD and HX of atypical AFB. She is on chronic prednisone of 10 mg daily. She is followed by Dr. Annamaria Boots   06/11/2016 Acute OV: Pt. Presents today for Acute exacerbation of her asthma. She was seen in the ED 1/31 ( yesterday), and CXR was negative for pneumonia or any cardiopulmonary process. Treatment included Albuterol neb treatment,increasing her chronic prednisone dose from 10 mg daily to 20 mg daily, Tessalon as needed for cough, Zofran for nausea, and Tylenol for fever.She states that after she was discharged she was called by the ED and told that her flu swab was positive for Influenza A. Tamiflu was was called in but the patient has been too sick to go and pick it up. She presented today with worsening shortness of breath, cough and wheezing. Oxygen saturation today was 91% on room air.She was treated with a xopenex neb treatment and will be admitted to [facility] for continued monitoring of her pulmonary  status as she is high risk with her underlying asthma for pulmonary complications.She states that she has not had any body aches or headache, but her husband has had  a cold, which he is recovering from. Dr. Chase Caller staffed the patient and agreed that she needed to be admitted to a telemetry bed for  treatment with Tamiflu and careful monitoring of her pulmonary status. .  Tests 06/10/2016  CXR: EXAM: CHEST  2 VIEW  COMPARISON:  10/25/2015 chest radiograph.  FINDINGS: Stable cardiomediastinal silhouette with top-normal heart size and aortic atherosclerosis. No pneumothorax. No pleural effusion. Lungs appear clear, with no acute consolidative airspace disease and no pulmonary edema.  IMPRESSION: No active cardiopulmonary disease.   Past medical hx Past Medical History:  Diagnosis Date  . Acute asthmatic bronchitis   . Allergic rhinitis   .  Allergic rhinitis   . Arthritis    HX OF RT SHOULDER BURSITIS-AND THE SHOULDER IS HURTING AT PRESENT, PAIN IN LEFT KNEE AT PRESENT  AND PAIN IN LEFT HIP  . Arthritis   . Asthmatic bronchitis   . Balance problem    SINCE BRAIN TUMOR REMOVED IN 2002-BENIGN TUMOR-PT HAS ADRENAL INSUFFICIENCY AND TAKES DAILY PREDNISONE  . Balance problem   . Blood transfusion   . Cardiac murmur    DOES NOT CAUSE ANY SYMPTOMS  . Chronic kidney disease   . Chronic pharyngitis   . Chronic pharyngitis   . Diverticulitis   . Diverticulitis, colon   . Dizziness   . DOE (dyspnea on exertion)   . Esophageal reflux   . Esophageal reflux   . GI bleed 01/06/2013  . GI bleed   . Hyperlipemia   . Hypertension   . Hypothyroidism   . IBS (irritable bowel syndrome)   . IBS (irritable bowel syndrome)   . Leukocytosis   . Leukocytosis   . Other specified iron deficiency anemias   . Recurrent upper respiratory infection (URI)    ON 07/10/11 PT SAW DR. Tarri Fuller YOUNG FOR TX OF ACUTE BRONCHITIS --GIVEN  EXTRA PREDNISONE ( IN ADDITION TO THE DAILY PREDNISONE PT TAKES)  AND PT FINISHED A Z-PAK--STILL HAS A COUGH TODAY 07/15/11-NO FEVER.  . Thrombocytosis ([place])   . Thrombocytosis ([place])   . Ulceration, colon   . Ulcerative colitis      Past surgical hx, Family hx, Social hx all reviewed.  No current facility-administered medications on file prior  to visit.    Current Outpatient Prescriptions on File Prior to Visit  Medication Sig  . albuterol (PROVENTIL HFA;VENTOLIN HFA) 108 (90 BASE) MCG/ACT inhaler Inhale 2 puffs into the lungs every 6 (six) hours as needed for wheezing or shortness of breath.  Marland Kitchen albuterol (PROVENTIL) (2.5 MG/3ML) 0.083% nebulizer solution Take 3 mLs (2.5 mg total) by nebulization every 6 (six) hours as needed for wheezing or shortness of breath.  Marland Kitchen amLODipine (NORVASC) 2.5 MG tablet Take 2.5 mg by mouth daily after breakfast.  . Artificial Tear Solution (BION TEARS) 0.1-0.3 % SOLN Place 1 drop into  both eyes every morning.   . Artificial Tear Solution (GENTEAL TEARS) 0.1-0.2-0.3 % SOLN Place 1 drop into both eyes at bedtime.  . B Complex Vitamins (VITAMIN B COMPLEX PO) Take 1 tablet by mouth daily.  . benzonatate (TESSALON) 100 MG capsule Take 1 capsule (100 mg total) by mouth 3 (three) times daily as needed for cough.  Marland Kitchen BIOTIN PO Take 1 tablet by mouth daily.  . cholecalciferol (VITAMIN D) 1000 UNITS tablet Take 1,000 Units by mouth daily.  . Cyanocobalamin (VITAMIN B-12) 2500 MCG SUBL Place 2,500 mcg under the tongue daily.  . famotidine (PEPCID) 20 MG tablet Take 20 mg by mouth daily.   . fluticasone (FLOVENT HFA) 110 MCG/ACT inhaler Inhale 2 puffs into the lungs 2 (two) times daily.  Marland Kitchen lactase (LACTAID) 3000 UNITS tablet Take 3,000 Units by mouth daily as needed (lactose intolerant).  Marland Kitchen levothyroxine (SYNTHROID, LEVOTHROID) 75 MCG tablet Take 75 mcg by mouth daily before breakfast.   . LORazepam (ATIVAN) 0.5 MG tablet Take 0.5 mg by mouth at bedtime as needed for sleep.   . Multiple Vitamin (MULTIVITAMIN) tablet Take 1 tablet by mouth daily.  . ondansetron (ZOFRAN ODT) 4 MG disintegrating tablet Take 1 tablet (4 mg total) by mouth every 8 (eight) hours as needed for nausea or vomiting.  . predniSONE (DELTASONE) 20 MG tablet Take 1 tablet (20 mg total) by mouth 2 (two) times daily.  . Probiotic Product (PROBIOTIC DAILY PO) Take 1 tablet by mouth daily.  . [DISCONTINUED] Alum & Mag Hydroxide-Simeth (MAGIC MOUTHWASH) SOLN Take 5 mLs by mouth 3 (three) times daily as needed. Thrush      Allergies  Allergen Reactions  . Biaxin [Clarithromycin] Other (See Comments)    Bitter taste  . Ciprofloxacin Rash  . Codeine Nausea And Vomiting  . Dilantin [Phenytoin] Rash  . Doxycycline Nausea Only    Very nauseated  . Lactose Intolerance (Gi) Other (See Comments)    gas  . Restasis [Cyclosporine] Other (See Comments)    Eyes burn   . Sulfonamide Derivatives Rash    Review Of  Systems:  Constitutional:   No  weight loss, night sweats,  Fevers, chills, fatigue, or  lassitude.  HEENT:   No headaches,  Difficulty swallowing,  Tooth/dental problems, or  Sore throat,                No sneezing, itching, ear ache, +nasal congestion, post nasal drip,   CV:  No chest pain, chest tightness,No  Orthopnea, PND, swelling in lower extremities, anasarca, dizziness, palpitations, syncope.   GI  No heartburn, indigestion, abdominal pain, +nausea, no vomiting, diarrhea, change in bowel habits, loss of appetite, bloody stools.   Resp: + shortness of breath with exertion or at rest.  + excess mucus, + productive cough,  No non-productive cough,  No coughing up of blood.  + change in color of  mucus. + wheezing.  No chest wall deformity  Skin: no rash or lesions.  GU: no dysuria, change in color of urine, no urgency or frequency.  No flank pain, no hematuria   MS:  No joint pain or swelling.  No decreased range of motion.  No back pain.  Psych:  No change in mood or affect. No depression or anxiety.  No memory loss.   Vital Signs BP 128/70 (BP Location: Left Arm, Cuff Size: Normal)   Pulse (!) 121   Temp 98.7 F (37.1 C) (Oral)   Ht 5' 3.5" (1.613 m)   Wt 129 lb 3.2 oz (58.6 kg)   SpO2 91%   BMI 22.53 kg/m    Physical Exam:  General- No distress,  A&Ox3, pleaant, but ill appearing ENT: No sinus tenderness, TM clear, pale nasal mucosa, no oral exudate,no post nasal drip, no LAN Cardiac: S1, S2, regular rate and rhythm, no murmur Chest: + wheeze/ no rales/ dullness; no accessory muscle use, no nasal flaring, no sternal retractions Abd.: Soft Non-tender Ext: No clubbing cyanosis, edema Neuro:  normal strength Skin: No rashes, warm and dry Psych: normal mood and behavior, anxious and concerned appropriately.   Assessment/Plan  Influenza A Admit to [facility] Telemetry 5E. See hospital H&P for plan of care  Asthma with acute exacerbation Acute flare 2/2 Influenza  A High Risk for Pneumonia and pulmonary complications Plan: Admit to [facility] Telemetry See hospital H&P     Magdalen Spatz, NP 06/11/2016  2:00 PM

## 2016-06-11 NOTE — Progress Notes (Signed)
CRITICAL VALUE ALERT  Critical value received:  Lactic acid 5.1  Date of notification: 06-11-16  Time of notification:  1508  Critical value read back: yes  Nurse who received alert:  Henrietta Dine  MD notified (1st page):   Time of first page:  1515  MD notified (2nd page): Dr. Elsworth Soho  Time of second page:  Responding MD: Dr. Elsworth Soho  Time MD responded:  1520

## 2016-06-11 NOTE — Assessment & Plan Note (Addendum)
Admit to [facility]. See hospital H&P for plan of care

## 2016-06-11 NOTE — H&P (Signed)
PULMONARY / CRITICAL CARE MEDICINE   Name: KAMILL FULBRIGHT MRN: [id number] DOB: 03/18/1929    ADMISSION DATE:  06/11/2016   CHIEF COMPLAINT:  Dyspnea   HISTORY OF PRESENT ILLNESS:   Sierra [name] is a 83 y.o. female with history of  asthma complicated by allergic rhinitis and bronchitis, GERD and HX of atypical AFB. She is on chronic prednisone of 10 mg daily for adrenal insufficiency.. She is followed by Dr. Annamaria Boots. She presented to the Pulmonary Office 06/11/2016 for worsening dyspnea overnight. She was seen in the ED 1/31 ( yesterday), for worsening productive cough and congestion x 2 days. Associated symptoms include subjective fever at home - 101 temporal per EMS. Husband had URI symptoms last week which have now resolved. Treatment included Albuterol neb treatment,increasing her chronic prednisone dose from 10 mg daily to 20 mg daily, Tessalon as needed for cough, Zofran for nausea, and Tylenol for fever.She states that after she was discharged she was called by the ED and told that her flu swab was positive for Influenza A. Tamiflu was was called in to her pharmacy but the patient has been too sick to go and pick it up. She presented to the office today with worsening shortness of breath, cough and wheezing. Oxygen saturation today was 91% on room air. Cough is severe ,with complaint of yellow to light green secretions.She was treated with a xopenex neb treatment in the exam room and will be admitted to [facility] 5 E for continued monitoring of her pulmonary  status as she is high risk with her underlying asthma for pulmonary complications.She states that she has not had any body aches or headache, but her husband has had  a cold, which he is recovering from. Dr. Chase Caller staffed the patient and agreed that she needed to be admitted to a telemetry bed for  treatment with Tamiflu and careful monitoring of her pulmonary status. Marland Kitchen    PAST MEDICAL HISTORY :  She  has a past medical  history of Acute asthmatic bronchitis; Allergic rhinitis; Allergic rhinitis; Arthritis; Arthritis; Asthmatic bronchitis; Balance problem; Balance problem; Blood transfusion; Cardiac murmur; Chronic kidney disease; Chronic pharyngitis; Chronic pharyngitis; Diverticulitis; Diverticulitis, colon; Dizziness; DOE (dyspnea on exertion); Esophageal reflux; Esophageal reflux; GI bleed (01/06/2013); GI bleed; Hyperlipemia; Hypertension; Hypothyroidism; IBS (irritable bowel syndrome); IBS (irritable bowel syndrome); Leukocytosis; Leukocytosis; Other specified iron deficiency anemias; Recurrent upper respiratory infection (URI); Thrombocytosis ([place]); Thrombocytosis ([place]); Ulceration, colon; and Ulcerative colitis.  PAST SURGICAL HISTORY: She  has a past surgical history that includes Partial colectomy (2008); Total hip arthroplasty (OCT 2006); Vesicovaginal fistula closure w/ TAH; Thyroidectomy (1986); Appendectomy; Craniotomy; subglottal mucocoel (2000); meningioma resected (20O2); Frontalis suspension (10-09-2010); Tonsillectomy (1938); Abdominal hysterectomy (1985); Dilation and curettage of uterus (1967); Eye surgery; WRIST TENDON LESION REMOVED 2007; Total hip arthroplasty (07/27/2011); Colonoscopy (N/A, 01/05/2013); Esophagogastroduodenoscopy (N/A, 01/05/2013); Colonoscopy (N/A, 01/04/2014); Esophagogastroduodenoscopy (N/A, 06/26/2014); and Flexible sigmoidoscopy (N/A, 06/26/2014).  Allergies  Allergen Reactions  . Biaxin [Clarithromycin] Other (See Comments)    Bitter taste  . Ciprofloxacin Rash  . Codeine Nausea And Vomiting  . Dilantin [Phenytoin] Rash  . Doxycycline Nausea Only    Very nauseated  . Lactose Intolerance (Gi) Other (See Comments)    gas  . Restasis [Cyclosporine] Other (See Comments)    Eyes burn   . Sulfonamide Derivatives Rash    No current facility-administered medications on file prior to encounter.    Current Outpatient Prescriptions on File Prior to Encounter  Medication Sig  .  albuterol (PROVENTIL HFA;VENTOLIN HFA) 108 (90 BASE) MCG/ACT inhaler Inhale 2 puffs into the lungs every 6 (six) hours as needed for wheezing or shortness of breath.  Marland Kitchen albuterol (PROVENTIL) (2.5 MG/3ML) 0.083% nebulizer solution Take 3 mLs (2.5 mg total) by nebulization every 6 (six) hours as needed for wheezing or shortness of breath.  Marland Kitchen amLODipine (NORVASC) 2.5 MG tablet Take 2.5 mg by mouth daily after breakfast.  . Artificial Tear Solution (BION TEARS) 0.1-0.3 % SOLN Place 1 drop into both eyes every morning.   . Artificial Tear Solution (GENTEAL TEARS) 0.1-0.2-0.3 % SOLN Place 1 drop into both eyes at bedtime.  . B Complex Vitamins (VITAMIN B COMPLEX PO) Take 1 tablet by mouth daily.  . benzonatate (TESSALON) 100 MG capsule Take 1 capsule (100 mg total) by mouth 3 (three) times daily as needed for cough.  Marland Kitchen BIOTIN PO Take 1 tablet by mouth daily.  . cholecalciferol (VITAMIN D) 1000 UNITS tablet Take 1,000 Units by mouth daily.  . Cyanocobalamin (VITAMIN B-12) 2500 MCG SUBL Place 2,500 mcg under the tongue daily.  . famotidine (PEPCID) 20 MG tablet Take 20 mg by mouth daily.   . fluticasone (FLOVENT HFA) 110 MCG/ACT inhaler Inhale 2 puffs into the lungs 2 (two) times daily.  Marland Kitchen lactase (LACTAID) 3000 UNITS tablet Take 3,000 Units by mouth daily as needed (lactose intolerant).  Marland Kitchen levothyroxine (SYNTHROID, LEVOTHROID) 75 MCG tablet Take 75 mcg by mouth daily before breakfast.   . LORazepam (ATIVAN) 0.5 MG tablet Take 0.5 mg by mouth at bedtime as needed for sleep.   . Multiple Vitamin (MULTIVITAMIN) tablet Take 1 tablet by mouth daily.  . ondansetron (ZOFRAN ODT) 4 MG disintegrating tablet Take 1 tablet (4 mg total) by mouth every 8 (eight) hours as needed for nausea or vomiting.  Marland Kitchen oseltamivir (TAMIFLU) 75 MG capsule   . predniSONE (DELTASONE) 20 MG tablet Take 1 tablet (20 mg total) by mouth 2 (two) times daily.  . Probiotic Product (PROBIOTIC DAILY PO) Take 1 tablet by mouth daily.  .  [DISCONTINUED] Alum & Mag Hydroxide-Simeth (MAGIC MOUTHWASH) SOLN Take 5 mLs by mouth 3 (three) times daily as needed. Thrush     FAMILY HISTORY:  Her indicated that her mother is deceased. She indicated that her father is deceased. She indicated that her sister is alive. She indicated that her maternal grandmother is deceased. She indicated that her maternal grandfather is deceased. She indicated that her paternal grandmother is deceased. She indicated that her paternal grandfather is deceased. She indicated that her daughter is alive. She indicated that her son is alive.    SOCIAL HISTORY: She  reports that she has never smoked. She has never used smokeless tobacco. She reports that she drinks alcohol. She reports that she does not use drugs.  REVIEW OF SYSTEMS:   Review Of Systems:  Constitutional:   No  weight loss, night sweats,  Fevers, chills, fatigue, or  lassitude.  HEENT:   No headaches,  Difficulty swallowing,  Tooth/dental problems, or  Sore throat,                No sneezing, itching, ear ache, +nasal congestion, post nasal drip,   CV:  No chest pain, chest tightness,No  Orthopnea, PND, swelling in lower extremities, anasarca, dizziness, palpitations, syncope.   GI  No heartburn, indigestion, abdominal pain, +nausea, no vomiting, diarrhea, change in bowel habits, loss of appetite, bloody stools.   Resp: + shortness of breath with exertion or at rest.  +  excess mucus, + productive cough,  No non-productive cough,  No coughing up of blood.  + change in color of mucus. + wheezing.  No chest wall deformity  Skin: no rash or lesions.  GU: no dysuria, change in color of urine, no urgency or frequency.  No flank pain, no hematuria   MS:  No joint pain or swelling.  No decreased range of motion.  No back pain.  Psych:  No change in mood or affect. No depression or anxiety.  No memory loss.   SUBJECTIVE:  I am coughing and wheezing and I can hardly breath.  VITAL  SIGNS: There were no vitals taken for this visit.  HEMODYNAMICS:    VENTILATOR SETTINGS:    INTAKE / OUTPUT: No intake/output data recorded.  PHYSICAL EXAMINATION: General- No distress,  A&Ox3, pleaant, but ill appearing ENT: No sinus tenderness, TM clear, pale nasal mucosa, no oral exudate,no post nasal drip, no LAN Cardiac: S1, S2, regular rate and rhythm, no murmur Chest: + wheeze/ no rales/ dullness; no accessory muscle use, no nasal flaring, no sternal retractions Abd.: Soft Non-tender Ext: No clubbing cyanosis, edema Neuro:  normal strength Skin: No rashes, warm and dry Psych: normal mood and behavior, anxious and concerned appropriately.  LABS: Results for RANITA, STJULIEN (MRN [id number]) as of 06/11/2016 13:10  Ref. Range 06/10/2016 12:06  Sodium Latest Ref Range: 135 - 145 mmol/L 136  Potassium Latest Ref Range: 3.5 - 5.1 mmol/L 3.4 (L)  Chloride Latest Ref Range: 101 - 111 mmol/L 102  CO2 Latest Ref Range: 22 - 32 mmol/L 22  Glucose Latest Ref Range: 65 - 99 mg/dL 138 (H)  BUN Latest Ref Range: 6 - 20 mg/dL 17  Creatinine Latest Ref Range: 0.44 - 1.00 mg/dL 1.02 (H)  Calcium Latest Ref Range: 8.9 - 10.3 mg/dL 9.1  Anion gap Latest Ref Range: 5 - 15  12  EGFR (African American) Latest Ref Range: >60 mL/min 56 (L)  EGFR (Non-African Amer.) Latest Ref Range: >60 mL/min 49 (L)  WBC Latest Ref Range: 4.0 - 10.5 K/uL 14.6 (H)  RBC Latest Ref Range: 3.87 - 5.11 MIL/uL 4.47  Hemoglobin Latest Ref Range: 12.0 - 15.0 g/dL 12.5  HCT Latest Ref Range: 36.0 - 46.0 % 38.4  MCV Latest Ref Range: 78.0 - 100.0 fL 85.9  MCH Latest Ref Range: 26.0 - 34.0 pg 28.0  MCHC Latest Ref Range: 30.0 - 36.0 g/dL 32.6  RDW Latest Ref Range: 11.5 - 15.5 % 14.4  Platelets Latest Ref Range: 150 - 400 K/uL 288  Neutrophils Latest Units: % 89  Lymphocytes Latest Units: % 5  Monocytes Relative Latest Units: % 5  Eosinophil Latest Units: % 1  Basophil Latest Units: % 0  NEUT# Latest Ref Range:  1.7 - 7.7 K/uL 12.9 (H)  Lymphocyte # Latest Ref Range: 0.7 - 4.0 K/uL 0.8  Monocyte # Latest Ref Range: 0.1 - 1.0 K/uL 0.8  Eosinophils Absolute Latest Ref Range: 0.0 - 0.7 K/uL 0.1  Basophils Absolute Latest Ref Range: 0.0 - 0.1 K/uL 0.0    BMET  Recent Labs Lab 06/10/16 1206  NA 136  K 3.4*  CL 102  CO2 22  BUN 17  CREATININE 1.02*  GLUCOSE 138*    Electrolytes  Recent Labs Lab 06/10/16 1206  CALCIUM 9.1    CBC  Recent Labs Lab 06/10/16 1206  WBC 14.6*  HGB 12.5  HCT 38.4  PLT 288    Coag's No results for input(s):  APTT, INR in the last 168 hours.  Sepsis Markers No results for input(s): LATICACIDVEN, PROCALCITON, O2SATVEN in the last 168 hours.  ABG No results for input(s): PHART, PCO2ART, PO2ART in the last 168 hours.  Liver Enzymes No results for input(s): AST, ALT, ALKPHOS, BILITOT, ALBUMIN in the last 168 hours.  Cardiac Enzymes No results for input(s): TROPONINI, PROBNP in the last 168 hours.  Glucose No results for input(s): GLUCAP in the last 168 hours.  Imaging Tests 06/10/2016  CXR: EXAM: CHEST  2 VIEW  COMPARISON:  10/25/2015 chest radiograph.  FINDINGS: Stable cardiomediastinal silhouette with top-normal heart size and aortic atherosclerosis. No pneumothorax. No pleural effusion. Lungs appear clear, with no acute consolidative airspace disease and no pulmonary edema.  IMPRESSION: No active cardiopulmonary disease.    STUDIES:  Office Spirometry 12/19/2015- WNL-FVC 1.93/82%, FEV1 1.50/86%, FEV1/FVC 0.77, FEF 25-75 percent 1.31/111%.  CULTURES: 06/11/2016>> Sputum  ANTIBIOTICS: None at present TamiFlu 75 mg BID  SIGNIFICANT EVENTS: 06/10/2016>> ED Visit/ Influenza A per PCA  LINES/TUBES: 06/11/2016>> PIV  DISCUSSION: 83 year old pt. with underlying asthma and  new diagnosis of Influenza A. She presented to the pulmonary office  with worsening  cough productive for yellow to green secretions, wheezing  and  progressive  dyspnea  over night. Saturations in the office today were 91% on room air. CXR 06/10/2016  In the ED was negative for cardiopulmonary process, but gave history of fever. She is high risk for pulmonary complications due to her underlying asthma and her age, so she is being admitted to a tele bed.  ASSESSMENT / PLAN:  PULMONARY A: Underlying asthma ( Per Dr. Annamaria Boots hard to control) Atypical AFB Dyspnea with productive cough, wheezing  + Influenza A per PCA P:   Oxygen as needed to maintain oxygen saturations > 94% Scheduled BD Prednisone 20 mg daily Low thresh hold for IV steroids if worsens CXR 06/12/2016 and prn MAG/Phos now ABG 06/11/2016  Aggressive Pulmonary toilet IS Flutter Valve as needed   CARDIOVASCULAR A:  Chest tightness in setting of asthma, acute bronchitis  P:  EKG upon admission Telemery bed Troponins x 3  RENAL A:   Slight elevation of creatinine 06/10/2016 ( 1.02) P:   Strict I&O CMET 06/11/2016 BMET 2/ 2/ 2018 Avoid nephrotoxic medications Repelete electrolytes as needed   GASTROINTESTINAL A:   No  Acute Issues P:   Heart healthy diet SUP with home pepcid 20 mg Q day  HEMATOLOGIC A:   Leukocytosis  P:  CBC now and prn Trend WBC Heparin SQ for VTE prophylaxis  INFECTIOUS A:   History of temp 101 at home + Influenza A P:   Tamiflu 75 mg BID CBC with Diff now and in am 2/2 Lactic Acid Procalcitonon Urine for strep antigen Urine for legionella Monitor WBC and fever curve Sputum for culture Low thresh hold for ABX with underlying pulmonary disease and high risk pneumonia  ENDOCRINE A:   Brain Tumor/Adrenal Insufficiency history Maintenance dose prednisone 10 mg daily   P:   Prednisone increased to 20 mg daily by ED for Acute Flare Will need to be D/C'd on home maintenence  NEUROLOGIC A:  Brain Tumor/Adrenal Insufficiency history  Alert oriented and approptiate  P:   RASS goal: 0-1 Monitor for AMS    FAMILY  -  Updates:Husband updated in full in the office  Pt. Was staffed by Dr. Chase Caller   - Inter-disciplinary family meet or Palliative Care meeting due by:  06/18/2016.Judson Roch  F. Genice [place] Pager: [phone]  06/11/2016, 12:58 PM

## 2016-06-11 NOTE — Patient Instructions (Signed)
Xopenex neb in office now Admission to [place] See H&P 06/11/2016

## 2016-06-12 ENCOUNTER — Ambulatory Visit: Payer: Medicare Other | Admitting: Physician Assistant

## 2016-06-12 ENCOUNTER — Telehealth: Payer: Self-pay | Admitting: Internal Medicine

## 2016-06-12 DIAGNOSIS — R0600 Dyspnea, unspecified: Secondary | ICD-10-CM | POA: Diagnosis not present

## 2016-06-12 DIAGNOSIS — J9601 Acute respiratory failure with hypoxia: Secondary | ICD-10-CM | POA: Diagnosis not present

## 2016-06-12 DIAGNOSIS — J45901 Unspecified asthma with (acute) exacerbation: Secondary | ICD-10-CM | POA: Diagnosis not present

## 2016-06-12 DIAGNOSIS — J101 Influenza due to other identified influenza virus with other respiratory manifestations: Secondary | ICD-10-CM | POA: Diagnosis not present

## 2016-06-12 LAB — TROPONIN I

## 2016-06-12 LAB — BASIC METABOLIC PANEL
Anion gap: 10 (ref 5–15)
BUN: 23 mg/dL — AB (ref 6–20)
CO2: 23 mmol/L (ref 22–32)
CREATININE: 0.86 mg/dL (ref 0.44–1.00)
Calcium: 8.6 mg/dL — ABNORMAL LOW (ref 8.9–10.3)
Chloride: 104 mmol/L (ref 101–111)
GFR calc Af Amer: >60 mL/min (ref >60)
GFR calc non Af Amer: 60 mL/min — ABNORMAL LOW (ref >60)
Glucose, Bld: 170 mg/dL — ABNORMAL HIGH (ref 65–99)
POTASSIUM: 3.8 mmol/L (ref 3.5–5.1)
Sodium: 137 mmol/L (ref 135–145)

## 2016-06-12 LAB — CBC
HEMATOCRIT: 33.7 % — AB (ref 36.0–46.0)
Hemoglobin: 10.9 g/dL — ABNORMAL LOW (ref 12.0–15.0)
MCH: 27.7 pg (ref 26.0–34.0)
MCHC: 32.3 g/dL (ref 30.0–36.0)
MCV: 85.8 fL (ref 78.0–100.0)
PLATELETS: 284 10*3/uL (ref 150–400)
RBC: 3.93 MIL/uL (ref 3.87–5.11)
RDW: 14.8 % (ref 11.5–15.5)
WBC: 18.5 10*3/uL — ABNORMAL HIGH (ref 4.0–10.5)

## 2016-06-12 LAB — LEGIONELLA PNEUMOPHILA SEROGP 1 UR AG: L. pneumophila Serogp 1 Ur Ag: NEGATIVE

## 2016-06-12 LAB — EXPECTORATED SPUTUM ASSESSMENT W GRAM STAIN, RFLX TO RESP C

## 2016-06-12 LAB — EXPECTORATED SPUTUM ASSESSMENT W REFEX TO RESP CULTURE

## 2016-06-12 LAB — STREP PNEUMONIAE URINARY ANTIGEN: STREP PNEUMO URINARY ANTIGEN: NEGATIVE

## 2016-06-12 LAB — LACTIC ACID, PLASMA: Lactic Acid, Venous: 1.8 mmol/L (ref 0.5–1.9)

## 2016-06-12 MED ORDER — ALBUTEROL SULFATE HFA 108 (90 BASE) MCG/ACT IN AERS: 2.0000 | INHALATION_SPRAY | Freq: Four times a day (QID) | RESPIRATORY_TRACT | 5 refills | Status: DC | PRN

## 2016-06-12 MED ORDER — ARFORMOTEROL TARTRATE 15 MCG/2ML IN NEBU
15.0000 ug | INHALATION_SOLUTION | Freq: Two times a day (BID) | RESPIRATORY_TRACT | Status: DC
Start: 2016-06-12 — End: 2016-06-15
  Administered 2016-06-12 – 2016-06-15 (×7): 15 ug via RESPIRATORY_TRACT
  Filled 2016-06-12 (×6): qty 2

## 2016-06-12 MED ORDER — SODIUM CHLORIDE 0.9 % IV SOLN: INTRAVENOUS | Status: DC | PRN

## 2016-06-12 MED ORDER — ALBUTEROL SULFATE (2.5 MG/3ML) 0.083% IN NEBU: 2.5000 mg | INHALATION_SOLUTION | RESPIRATORY_TRACT | Status: DC | PRN

## 2016-06-12 MED ORDER — METHYLPREDNISOLONE SODIUM SUCC 40 MG IJ SOLR
40.0000 mg | Freq: Four times a day (QID) | INTRAMUSCULAR | Status: DC
  Administered 2016-06-12 – 2016-06-13 (×4): 40 mg via INTRAVENOUS
  Filled 2016-06-12 (×4): qty 1

## 2016-06-12 MED ORDER — BUDESONIDE 0.25 MG/2ML IN SUSP
0.5000 mg | Freq: Two times a day (BID) | RESPIRATORY_TRACT | Status: DC
  Administered 2016-06-12 – 2016-06-13 (×2): 0.5 mg via RESPIRATORY_TRACT
  Filled 2016-06-12 (×3): qty 4

## 2016-06-12 NOTE — Progress Notes (Signed)
PCCM Progress Note  Admission date: 06/11/2016  CC: Short of breath  HPI: 83 yo female with cough, fever 101F, wheezing, dyspnea and found to have Influenza A.  PMHx of Ulcerative colitis, IBS, Hypothyroidism, HTN, HLD, GERD, CKD 2, Asthma.  Subjective: Breathing better.  More energy.  Wants regular food.  Still has cough and rattle in chest.  BP (!) 125/59 (BP Location: Left Arm)   Pulse 92   Temp 98.1 F (36.7 C) (Oral)   Resp (!) 26   SpO2 92%   Intake/outpt: I/O last 3 completed shifts: In: 1418.3 [P.O.:480; I.V.:938.3] Out: 250 [Urine:250]  General: alert Neuro: normal strength Eyes: wears glasses, pupils reactive ENT: no stridor Cardiac: regular, no murmur Chest: b/l rhonchi with faint wheeze Abd: soft, non tender Ext: no edema Skin: no rashes   CMP Latest Ref Rng & Units 06/12/2016 06/11/2016 06/10/2016  Glucose 65 - 99 mg/dL 170(H) 158(H) 138(H)  BUN 6 - 20 mg/dL 23(H) 33(H) 17  Creatinine 0.44 - 1.00 mg/dL 0.86 1.46(H) 1.02(H)  Sodium 135 - 145 mmol/L 137 136 136  Potassium 3.5 - 5.1 mmol/L 3.8 4.6 3.4(L)  Chloride 101 - 111 mmol/L 104 99(L) 102  CO2 22 - 32 mmol/L 23 25 22   Calcium 8.9 - 10.3 mg/dL 8.6(L) 9.9 9.1  Total Protein 6.5 - 8.1 g/dL - 7.8 -  Total Bilirubin 0.3 - 1.2 mg/dL - 0.4 -  Alkaline Phos 38 - 126 U/L - 40 -  AST 15 - 41 U/L - 37 -  ALT 14 - 54 U/L - 20 -    CBC Latest Ref Rng & Units 06/12/2016 06/11/2016 06/10/2016  WBC 4.0 - 10.5 K/uL 18.5(H) 19.9(H) 14.6(H)  Hemoglobin 12.0 - 15.0 g/dL 10.9(L) 11.5(L) 12.5  Hematocrit 36.0 - 46.0 % 33.7(L) 35.0(L) 38.4  Platelets 150 - 400 K/uL 284 298 288    ABG    Component Value Date/Time   PHART 7.423 06/11/2016 1448   PCO2ART 29.2 (L) 06/11/2016 1448   PO2ART 96.2 06/11/2016 1448   HCO3 18.7 (L) 06/11/2016 1448   TCO2 23 01/03/2014 1456   ACIDBASEDEF 4.2 (H) 06/11/2016 1448   O2SAT 97.5 06/11/2016 1448    Imaging: X-ray Chest Pa And Lateral  Result Date: 06/11/2016 CLINICAL DATA:   Asthma. EXAM: CHEST  2 VIEW COMPARISON:  06/10/2016.  10/25/2015.  CT 10/25/2015. FINDINGS: Mediastinum and hilar structures normal. Heart size stable. Stable fat pads. Stable left base pleuroparenchymal scarring. No pneumothorax. No acute bony abnormality . IMPRESSION: No acute cardiopulmonary disease. Electronically Signed   By: Marcello Moores  Register   On: 06/11/2016 16:12   Dg Chest 2 View  Result Date: 06/10/2016 CLINICAL DATA:  Worsening cough.  Fever.  Dyspnea. EXAM: CHEST  2 VIEW COMPARISON:  10/25/2015 chest radiograph. FINDINGS: Stable cardiomediastinal silhouette with top-normal heart size and aortic atherosclerosis. No pneumothorax. No pleural effusion. Lungs appear clear, with no acute consolidative airspace disease and no pulmonary edema. IMPRESSION: No active cardiopulmonary disease. Electronically Signed   By: Ilona Sorrel M.D.   On: 06/10/2016 11:48   Cultures: Influenza PCR 1/31 >> Influenza A positive Pneumococcal Ag 2/01 >> negative Legionella Ag 2/01 >>  Antibiotics: Tamiflu 2/01 >>   Assessment/plan:  Influenza A with asthmatic bronchitis. - day 2 of tamiflu - budesonide, pulmicort, prn albuterol - continue mucinex - flutter valve  Steroid induced hyperglycemia. - SSI if blood sugar > 180  Hx of hypothyroidism. - continue synthroid  Hx of HTN. - norvasc, ASA - d/c telemetry  2/02  Resolved problems >> lactic acidosis  DVT prophylaxis - SQ heparin SUP - pepcid Nutrition - regular diet Goal of care - Full code  Time spent 37 minutes  Chesley Mires, MD [place] 06/12/2016, 8:46 AM Pager:  [phone] After 3pm call: [phone]

## 2016-06-12 NOTE — Telephone Encounter (Signed)
Spoke with pt's husband, [place]. States that pt needs a refill on Albuterol HFA. Rx has been sent in. Nothing further was needed.

## 2016-06-13 DIAGNOSIS — J45901 Unspecified asthma with (acute) exacerbation: Secondary | ICD-10-CM | POA: Diagnosis not present

## 2016-06-13 DIAGNOSIS — J9601 Acute respiratory failure with hypoxia: Secondary | ICD-10-CM | POA: Diagnosis not present

## 2016-06-13 DIAGNOSIS — R0600 Dyspnea, unspecified: Secondary | ICD-10-CM | POA: Diagnosis not present

## 2016-06-13 DIAGNOSIS — J101 Influenza due to other identified influenza virus with other respiratory manifestations: Secondary | ICD-10-CM | POA: Diagnosis not present

## 2016-06-13 MED ORDER — BUDESONIDE 0.5 MG/2ML IN SUSP
0.5000 mg | Freq: Two times a day (BID) | RESPIRATORY_TRACT | Status: DC
  Administered 2016-06-13 – 2016-06-15 (×4): 0.5 mg via RESPIRATORY_TRACT
  Filled 2016-06-13 (×3): qty 2

## 2016-06-13 MED ORDER — PREDNISONE 20 MG PO TABS
40.0000 mg | ORAL_TABLET | Freq: Every day | ORAL | Status: DC
  Administered 2016-06-13 – 2016-06-15 (×3): 40 mg via ORAL
  Filled 2016-06-13 (×3): qty 2

## 2016-06-13 NOTE — Progress Notes (Signed)
PCCM Progress Note  Admission date: 06/11/2016  CC: Short of breath  HPI: 83 yo female with cough, fever 101F, wheezing, dyspnea and found to have Influenza A.  PMHx of Ulcerative colitis, IBS, Hypothyroidism, HTN, HLD, GERD, CKD 2, Asthma.  Subjective: She feels better, still feels like she needs to clear some mucous that she cannot cough up.  Less SOB Tolerating heart healthy diet  BP (!) 144/67 (BP Location: Right Arm)   Pulse 88   Temp 98.5 F (36.9 C) (Oral)   Resp 18   SpO2 92%   Intake/outpt: I/O last 3 completed shifts: In: 1298.3 [P.O.:360; I.V.:938.3] Out: -  Gen: Pleasant, well-nourished, in no distress on RA,  normal affect  ENT: No lesions,  mouth clear,  oropharynx clear, no postnasal drip  Neck: No JVD, low pitched insp and exp stridor and moving secretions  Lungs: No use of accessory muscles, B exp rhonchi, some referred UA noise  Cardiovascular: Regular, no M, + BS  Abdomen: soft and NT, no HSM,  BS normal  Musculoskeletal: No deformities, no cyanosis or clubbing  Neuro: alert, non focal  Skin: Warm, no lesions or rashes   CMP Latest Ref Rng & Units 06/12/2016 06/11/2016 06/10/2016  Glucose 65 - 99 mg/dL 170(H) 158(H) 138(H)  BUN 6 - 20 mg/dL 23(H) 33(H) 17  Creatinine 0.44 - 1.00 mg/dL 0.86 1.46(H) 1.02(H)  Sodium 135 - 145 mmol/L 137 136 136  Potassium 3.5 - 5.1 mmol/L 3.8 4.6 3.4(L)  Chloride 101 - 111 mmol/L 104 99(L) 102  CO2 22 - 32 mmol/L 23 25 22   Calcium 8.9 - 10.3 mg/dL 8.6(L) 9.9 9.1  Total Protein 6.5 - 8.1 g/dL - 7.8 -  Total Bilirubin 0.3 - 1.2 mg/dL - 0.4 -  Alkaline Phos 38 - 126 U/L - 40 -  AST 15 - 41 U/L - 37 -  ALT 14 - 54 U/L - 20 -    CBC Latest Ref Rng & Units 06/12/2016 06/11/2016 06/10/2016  WBC 4.0 - 10.5 K/uL 18.5(H) 19.9(H) 14.6(H)  Hemoglobin 12.0 - 15.0 g/dL 10.9(L) 11.5(L) 12.5  Hematocrit 36.0 - 46.0 % 33.7(L) 35.0(L) 38.4  Platelets 150 - 400 K/uL 284 298 288    ABG    Component Value Date/Time   PHART 7.423  06/11/2016 1448   PCO2ART 29.2 (L) 06/11/2016 1448   PO2ART 96.2 06/11/2016 1448   HCO3 18.7 (L) 06/11/2016 1448   TCO2 23 01/03/2014 1456   ACIDBASEDEF 4.2 (H) 06/11/2016 1448   O2SAT 97.5 06/11/2016 1448    Imaging: X-ray Chest Pa And Lateral  Result Date: 06/11/2016 CLINICAL DATA:  Asthma. EXAM: CHEST  2 VIEW COMPARISON:  06/10/2016.  10/25/2015.  CT 10/25/2015. FINDINGS: Mediastinum and hilar structures normal. Heart size stable. Stable fat pads. Stable left base pleuroparenchymal scarring. No pneumothorax. No acute bony abnormality . IMPRESSION: No acute cardiopulmonary disease. Electronically Signed   By: Marcello Moores  Register   On: 06/11/2016 16:12   Cultures: Influenza PCR 1/31 >> Influenza A positive Pneumococcal Ag 2/01 >> negative Legionella Ag 2/01 >>  Antibiotics: Tamiflu 2/01 >>   Assessment/plan:  Asthma exacerbation Influenza A - tamiflu day 3 - budesonide, brovana, xopenex prn - solumedrol to prednisone on 2/3 - tessalon prn - mucinex - flutter  HTN - home amlodipine, ASA  Hypothyroidism - synthroid  Hyperglycemia on steroids - follow glu on BMP, cover w SSI if > 180   Resolved problems >> lactic acidosis  DVT prophylaxis - SQ heparin SUP -  pepcid Nutrition - regular diet Goal of care - Full code  Time spent 35 minutes  Baltazar Apo, MD, PhD 06/13/2016, 11:40 AM [facility] [phone] or if no answer [phone]

## 2016-06-14 DIAGNOSIS — R0600 Dyspnea, unspecified: Secondary | ICD-10-CM | POA: Diagnosis not present

## 2016-06-14 DIAGNOSIS — J101 Influenza due to other identified influenza virus with other respiratory manifestations: Secondary | ICD-10-CM | POA: Diagnosis not present

## 2016-06-14 LAB — CULTURE, RESPIRATORY: CULTURE: NORMAL

## 2016-06-14 LAB — CULTURE, RESPIRATORY W GRAM STAIN

## 2016-06-14 MED ORDER — GUAIFENESIN 100 MG/5ML PO SOLN
5.0000 mL | ORAL | Status: DC | PRN
  Administered 2016-06-14 (×2): 100 mg via ORAL
  Filled 2016-06-14 (×2): qty 10

## 2016-06-14 MED ORDER — MENTHOL 3 MG MT LOZG
1.0000 | LOZENGE | OROMUCOSAL | Status: DC | PRN
  Administered 2016-06-14: 3 mg via ORAL
  Filled 2016-06-14: qty 9

## 2016-06-14 NOTE — Progress Notes (Signed)
PCCM Progress Note  Admission date: 06/11/2016  CC: Short of breath  HPI: 83 yo female with cough, fever 101F, wheezing, dyspnea and found to have Influenza A.  PMHx of Ulcerative colitis, IBS, Hypothyroidism, HTN, HLD, GERD, CKD 2, Asthma.  Subjective: Wants some cepachol for irritated throat Wants some robitussin  BP (!) 152/78 (BP Location: Right Arm) Comment: nurse aware  Pulse 88   Temp 98.3 F (36.8 C) (Oral)   Resp 20   SpO2 94%   Intake/outpt: I/O last 3 completed shifts: In: 18 [P.O.:720] Out: -   Gen: Pleasant, well-nourished, in no distress on RA,  normal affect  ENT: No lesions,  mouth clear,  oropharynx clear, no postnasal drip  Neck: No JVD, low pitched insp and exp stridor and secretions.   Lungs: No use of accessory muscles, B exp rhonchi and referred UA noise  Cardiovascular: RRR, heart sounds normal, no murmur or gallops, no peripheral edema  Musculoskeletal: No deformities, no cyanosis or clubbing  Neuro: alert, non focal  Skin: Warm, no lesions or rashes     CMP Latest Ref Rng & Units 06/12/2016 06/11/2016 06/10/2016  Glucose 65 - 99 mg/dL 170(H) 158(H) 138(H)  BUN 6 - 20 mg/dL 23(H) 33(H) 17  Creatinine 0.44 - 1.00 mg/dL 0.86 1.46(H) 1.02(H)  Sodium 135 - 145 mmol/L 137 136 136  Potassium 3.5 - 5.1 mmol/L 3.8 4.6 3.4(L)  Chloride 101 - 111 mmol/L 104 99(L) 102  CO2 22 - 32 mmol/L 23 25 22   Calcium 8.9 - 10.3 mg/dL 8.6(L) 9.9 9.1  Total Protein 6.5 - 8.1 g/dL - 7.8 -  Total Bilirubin 0.3 - 1.2 mg/dL - 0.4 -  Alkaline Phos 38 - 126 U/L - 40 -  AST 15 - 41 U/L - 37 -  ALT 14 - 54 U/L - 20 -    CBC Latest Ref Rng & Units 06/12/2016 06/11/2016 06/10/2016  WBC 4.0 - 10.5 K/uL 18.5(H) 19.9(H) 14.6(H)  Hemoglobin 12.0 - 15.0 g/dL 10.9(L) 11.5(L) 12.5  Hematocrit 36.0 - 46.0 % 33.7(L) 35.0(L) 38.4  Platelets 150 - 400 K/uL 284 298 288    ABG    Component Value Date/Time   PHART 7.423 06/11/2016 1448   PCO2ART 29.2 (L) 06/11/2016 1448   PO2ART  96.2 06/11/2016 1448   HCO3 18.7 (L) 06/11/2016 1448   TCO2 23 01/03/2014 1456   ACIDBASEDEF 4.2 (H) 06/11/2016 1448   O2SAT 97.5 06/11/2016 1448    Imaging: No results found. Cultures: Influenza PCR 1/31 >> Influenza A positive Pneumococcal Ag 2/01 >> negative Legionella Ag 2/01 >>  Antibiotics: Tamiflu 2/01 >>   Assessment/plan:  Asthma exacerbation Influenza A - tamiflu day 4, continue - continue budesonide, brovana, xopenex prn - solumedrol was changed to prednisone on 2/3, continue current dose - tessalon prn - mucinex - flutter valve - add cepachol and robitussin  HTN - continue home amlodipine - she was not on ASA at home, will stop this.   Hypothyroidism - continue synthroid  Hyperglycemia on steroids - follow glu on BMP, cover w SSI if > 180   Resolved problems >> lactic acidosis  DVT prophylaxis - SQ heparin SUP - pepcid Nutrition - regular diet Goal of care - Full code  Time spent 35 minutes  Baltazar Apo, MD, PhD 06/14/2016, 11:52 AM [facility] [phone] or if no answer [phone]

## 2016-06-15 ENCOUNTER — Ambulatory Visit: Payer: Medicare Other | Admitting: Internal Medicine

## 2016-06-15 DIAGNOSIS — J4521 Mild intermittent asthma with (acute) exacerbation: Secondary | ICD-10-CM

## 2016-06-15 DIAGNOSIS — J9601 Acute respiratory failure with hypoxia: Secondary | ICD-10-CM | POA: Diagnosis not present

## 2016-06-15 DIAGNOSIS — J101 Influenza due to other identified influenza virus with other respiratory manifestations: Secondary | ICD-10-CM | POA: Diagnosis not present

## 2016-06-15 MED ORDER — GUAIFENESIN 100 MG/5ML PO SOLN: 5.0000 mL | ORAL | 0 refills | Status: DC | PRN

## 2016-06-15 MED ORDER — PREDNISONE 10 MG PO TABS: ORAL_TABLET | ORAL | 0 refills | Status: DC

## 2016-06-15 MED ORDER — LORAZEPAM 0.5 MG PO TABS: 0.5000 mg | ORAL_TABLET | Freq: Every day | ORAL | 0 refills | Status: DC

## 2016-06-15 MED ORDER — OSELTAMIVIR PHOSPHATE 30 MG PO CAPS: 30.0000 mg | ORAL_CAPSULE | Freq: Two times a day (BID) | ORAL | 0 refills | Status: AC

## 2016-06-15 NOTE — Discharge Summary (Signed)
Physician Discharge Summary       [name] MRN: [id number] DOB/AGE: [birth year] 83 y.o.  Admit date: 06/11/2016 Discharge date: 06/15/2016  Discharge Diagnoses:  Asthmatic exacerbation Influenza A HTN Hypothyroidism  Hyperthyroidism Lactic acidosis   Detailed Hospital Course:  83 year old w/ known asthma. Seen in office w/ 2d h/o flu-like symptoms. Was seen in ER the day before and diagnosed w/ influenza but was not well enough to get tamiflu. In the office presented acutely ill w/ increased respiratory distress so was admitted for further treatment.  In the hospital she was treated w/ supplemental oxygen, scheduled BDs, Tamiflu, and systemic steroids. She continued to improve and was deemed ready for discharge as of 2/5. She will be discharged to home with the plan of care as outlined below.    Discharge Plan by active problems  Asthmatic exacerbation in setting of influenza A Plan:  Home on flovent HFA and PRN SABA pred taper  mucinex Tessalon PRN cepachol and robtussin PRN F/u next week in our office  One more day of tamiflu  HTN Plan: Resume amlodipine   Hypothyroidism  Plan: Home on her [facility] tests/ studies  Consults   Discharge Exam: BP (!) 155/76 (BP Location: Right Arm)   Pulse 98   Temp 98.5 F (36.9 C) (Oral)   Resp 18   SpO2 96%  General appearance:  83 Year old  female, well nourished/  NAD, conversant  Eyes: anicteric sclerae, moist conjunctivae; PERRL, EOMI bilaterally. Mouth:  membranes and no mucosal ulcerations; normal hard and soft palate Neck: Trachea midline; neck supple, no JVD Lungs/chest: diffuse exp wheeze w/ some rhonchi after cough but  with normal respiratory effort and no intercostal retractions CV: RRR, no MRGs  Abdomen: Soft, non-tender; no masses or HSM Extremities: No peripheral edema or extremity lymphadenopathy Skin: Normal temperature, turgor and texture; no rash, ulcers or  subcutaneous nodules Psych: Appropriate affect, alert and oriented to person, place and time  Labs at discharge Lab Results  Component Value Date   CREATININE 0.86 06/12/2016   BUN 23 (H) 06/12/2016   NA 137 06/12/2016   K 3.8 06/12/2016   CL 104 06/12/2016   CO2 23 06/12/2016   Lab Results  Component Value Date   WBC 18.5 (H) 06/12/2016   HGB 10.9 (L) 06/12/2016   HCT 33.7 (L) 06/12/2016   MCV 85.8 06/12/2016   PLT 284 06/12/2016   Lab Results  Component Value Date   ALT 20 06/11/2016   AST 37 06/11/2016   ALKPHOS 40 06/11/2016   BILITOT 0.4 06/11/2016   Lab Results  Component Value Date   INR 1.24 10/25/2015   INR 1.03 12/31/2013   INR 1.10 01/05/2013    Current radiology studies No results found.  Disposition:  01-Home or Self Care  Discharge Instructions    Diet - low sodium heart healthy    Complete by:  As directed    Increase activity slowly    Complete by:  As directed      Allergies as of 06/15/2016      Reactions   Biaxin [clarithromycin] Other (See Comments)   Bitter taste   Ciprofloxacin Rash   Codeine Nausea And Vomiting   Dilantin [phenytoin] Rash   Doxycycline Nausea Only   Very nauseated   Lactose Intolerance (gi) Other (See Comments)   gas   Restasis [cyclosporine] Other (See Comments)   Eyes burn   Sulfonamide Derivatives Rash  Medication List    TAKE these medications   albuterol (2.5 MG/3ML) 0.083% nebulizer solution Commonly known as:  PROVENTIL Take 3 mLs (2.5 mg total) by nebulization every 6 (six) hours as needed for wheezing or shortness of breath.   albuterol 108 (90 Base) MCG/ACT inhaler Commonly known as:  PROVENTIL HFA;VENTOLIN HFA Inhale 2 puffs into the lungs every 6 (six) hours as needed for wheezing or shortness of breath.   amLODipine 2.5 MG tablet Commonly known as:  NORVASC Take 2.5 mg by mouth daily after breakfast.   benzonatate 100 MG capsule Commonly known as:  TESSALON Take 1 capsule (100 mg  total) by mouth 3 (three) times daily as needed for cough.   GENTEAL TEARS 0.1-0.2-0.3 % Soln Place 1 drop into both eyes at bedtime.   BION TEARS 0.1-0.3 % Soln Place 1 drop into both eyes every morning.   BIOTIN PO Take 1 tablet by mouth daily.   cholecalciferol 1000 units tablet Commonly known as:  VITAMIN D Take 1,000 Units by mouth daily.   famotidine 20 MG tablet Commonly known as:  PEPCID Take 20 mg by mouth daily.   fluticasone 110 MCG/ACT inhaler Commonly known as:  FLOVENT HFA Inhale 2 puffs into the lungs 2 (two) times daily.   guaiFENesin 100 MG/5ML Soln Commonly known as:  ROBITUSSIN Take 5 mLs (100 mg total) by mouth every 4 (four) hours as needed for cough or to loosen phlegm.   lactase 3000 units tablet Commonly known as:  LACTAID Take 3,000 Units by mouth daily as needed (lactose intolerant).   levothyroxine 75 MCG tablet Commonly known as:  SYNTHROID, LEVOTHROID Take 75 mcg by mouth daily before breakfast.   LORazepam 0.5 MG tablet Commonly known as:  ATIVAN Take 1 tablet (0.5 mg total) by mouth at bedtime. What changed:  when to take this  reasons to take this   multivitamin tablet Take 1 tablet by mouth daily.   ondansetron 4 MG disintegrating tablet Commonly known as:  ZOFRAN ODT Take 1 tablet (4 mg total) by mouth every 8 (eight) hours as needed for nausea or vomiting.   oseltamivir 30 MG capsule Commonly known as:  TAMIFLU Take 1 capsule (30 mg total) by mouth 2 (two) times daily. What changed:  medication strength  how much to take   predniSONE 10 MG tablet Commonly known as:  DELTASONE Take 4 tabs  daily with food x 4 days, then 3 tabs daily x 4 days, then 2 tabs daily x 4 days, then 1 tab daily x4 days then stop. #40 What changed:  medication strength  how much to take  how to take this  when to take this  additional instructions   PROBIOTIC DAILY PO Take 1 tablet by mouth daily.   VITAMIN B COMPLEX PO Take 1  tablet by mouth daily.   Vitamin B-12 2500 MCG Subl Place 2,500 mcg under the tongue daily.        Discharged Condition: good  Physician Statement:   The Patient was personally examined, the discharge assessment and plan has been personally reviewed and I agree with ACNP Babcock's assessment and plan. 32 minutes of time have been dedicated to discharge assessment, planning and discharge instructions.   Signed: Clementeen Graham 06/15/2016, 2:09 PM

## 2016-06-15 NOTE — Care Management Important Message (Signed)
Important Message  Patient Details IM Letter given to Rhonda/Case Manager to present to Patient Name: Sierra [name] MRN: [id number] Date of Birth: 1929-03-20   Medicare Important Message Given:  Yes    Kerin Salen 06/15/2016, 1:16 PM

## 2016-06-16 ENCOUNTER — Telehealth: Payer: Self-pay | Admitting: Internal Medicine

## 2016-06-16 DIAGNOSIS — J45909 Unspecified asthma, uncomplicated: Secondary | ICD-10-CM

## 2016-06-16 NOTE — Telephone Encounter (Signed)
ATC - Line busy - WCB 

## 2016-06-17 NOTE — Telephone Encounter (Signed)
Spoke with the pt  She states that she is needing new neb machine  Her current machine is over 83 y/o  Order sent to [facility]

## 2016-06-22 ENCOUNTER — Ambulatory Visit (INDEPENDENT_AMBULATORY_CARE_PROVIDER_SITE_OTHER): Payer: Medicare Other | Admitting: Acute Care

## 2016-06-22 ENCOUNTER — Encounter: Payer: Self-pay | Admitting: Acute Care

## 2016-06-22 ENCOUNTER — Telehealth: Payer: Self-pay | Admitting: Physician Assistant

## 2016-06-22 DIAGNOSIS — J4521 Mild intermittent asthma with (acute) exacerbation: Secondary | ICD-10-CM

## 2016-06-22 MED ORDER — AZITHROMYCIN 250 MG PO TABS: ORAL_TABLET | ORAL | 0 refills | Status: DC

## 2016-06-22 NOTE — Progress Notes (Signed)
History of Present Illness Sierra [name] is a 83 y.o. female with asthma complicated by allergic rhinitis and bronchitis, GERD and HX of atypical AFB. She is on chronic prednisone of 10 mg daily. She is followed by Dr. Annamaria Boots   06/22/2016 Hospital Follow Up: Pt presents for follow up of hospitalization 06/11/2016. She was hospitalized from 06/11/2016 through 06/15/2016. She was positive for influenza A, which triggered an asthma exacerbation. She was admitted and treated with Tamiflu, supplemental oxygen, scheduled bronchodilators, and systemic steroids. She improved and was deemed ready for discharge on 06/15/2016. She presents today for hospital follow-up with continued cough and congestion.She states she is coughing up very little. She is using her mucinex  Every 12 hours. She states she gets up a scant amount of light yellow thin  Secretions up with cough. She denies chest pain or fever, no orthopnea or hemoptysis. She is completing her prednisone taper as prescribed. She continues to complain of fatigue. She states she does have a good appetite. She denies feeling short of breath. She has not been using her Flovent for maintenance. She has been using her prn albuterol 6 times a day, whether she needs it or not.She states Tessalon is not helping her cough, but the Robitussin works better.She completed her Tamiflu. She is compliant with her Zantac daily. I explained that she was very sick with the flu, and she may take a while to rebound to her baseline. We will have a short term follow up to assure continued improvement.   Tests FINDINGS: Mediastinum and hilar structures normal. Heart size stable. Stable fat pads. Stable left base pleuroparenchymal scarring. No pneumothorax. No acute bony abnormality .  IMPRESSION: No acute cardiopulmonary disease.  Past medical hx Past Medical History:  Diagnosis Date  . Acute asthmatic bronchitis   . Allergic rhinitis   . Allergic rhinitis   .  Arthritis    HX OF RT SHOULDER BURSITIS-AND THE SHOULDER IS HURTING AT PRESENT, PAIN IN LEFT KNEE AT PRESENT  AND PAIN IN LEFT HIP  . Arthritis   . Asthmatic bronchitis   . Balance problem    SINCE BRAIN TUMOR REMOVED IN 2002-BENIGN TUMOR-PT HAS ADRENAL INSUFFICIENCY AND TAKES DAILY PREDNISONE  . Balance problem   . Blood transfusion   . Cardiac murmur    DOES NOT CAUSE ANY SYMPTOMS  . Chronic kidney disease   . Chronic pharyngitis   . Chronic pharyngitis   . Diverticulitis   . Diverticulitis, colon   . Dizziness   . DOE (dyspnea on exertion)   . Esophageal reflux   . Esophageal reflux   . GI bleed 01/06/2013  . GI bleed   . Hyperlipemia   . Hypertension   . Hypothyroidism   . IBS (irritable bowel syndrome)   . IBS (irritable bowel syndrome)   . Leukocytosis   . Leukocytosis   . Other specified iron deficiency anemias   . Recurrent upper respiratory infection (URI)    ON 07/10/11 PT SAW DR. Tarri Fuller YOUNG FOR TX OF ACUTE BRONCHITIS --GIVEN  EXTRA PREDNISONE ( IN ADDITION TO THE DAILY PREDNISONE PT TAKES)  AND PT FINISHED A Z-PAK--STILL HAS A COUGH TODAY 07/15/11-NO FEVER.  . Thrombocytosis ([place])   . Thrombocytosis ([place])   . Ulceration, colon   . Ulcerative colitis      Past surgical hx, Family hx, Social hx all reviewed.  Current Outpatient Prescriptions on File Prior to Visit  Medication Sig  . albuterol (PROVENTIL HFA;VENTOLIN HFA) 108 (90  Base) MCG/ACT inhaler Inhale 2 puffs into the lungs every 6 (six) hours as needed for wheezing or shortness of breath.  Marland Kitchen albuterol (PROVENTIL) (2.5 MG/3ML) 0.083% nebulizer solution Take 3 mLs (2.5 mg total) by nebulization every 6 (six) hours as needed for wheezing or shortness of breath.  Marland Kitchen amLODipine (NORVASC) 2.5 MG tablet Take 2.5 mg by mouth daily after breakfast.  . Artificial Tear Solution (BION TEARS) 0.1-0.3 % SOLN Place 1 drop into both eyes every morning.   . Artificial Tear Solution (GENTEAL TEARS) 0.1-0.2-0.3 % SOLN Place 1  drop into both eyes at bedtime.  . B Complex Vitamins (VITAMIN B COMPLEX PO) Take 1 tablet by mouth daily.  . benzonatate (TESSALON) 100 MG capsule Take 1 capsule (100 mg total) by mouth 3 (three) times daily as needed for cough.  Marland Kitchen BIOTIN PO Take 1 tablet by mouth daily.  . cholecalciferol (VITAMIN D) 1000 UNITS tablet Take 1,000 Units by mouth daily.  . Cyanocobalamin (VITAMIN B-12) 2500 MCG SUBL Place 2,500 mcg under the tongue daily.  . famotidine (PEPCID) 20 MG tablet Take 20 mg by mouth daily.   . fluticasone (FLOVENT HFA) 110 MCG/ACT inhaler Inhale 2 puffs into the lungs 2 (two) times daily.  Marland Kitchen guaiFENesin (ROBITUSSIN) 100 MG/5ML SOLN Take 5 mLs (100 mg total) by mouth every 4 (four) hours as needed for cough or to loosen phlegm.  . lactase (LACTAID) 3000 UNITS tablet Take 3,000 Units by mouth daily as needed (lactose intolerant).  Marland Kitchen levothyroxine (SYNTHROID, LEVOTHROID) 75 MCG tablet Take 75 mcg by mouth daily before breakfast.   . LORazepam (ATIVAN) 0.5 MG tablet Take 1 tablet (0.5 mg total) by mouth at bedtime.  . Multiple Vitamin (MULTIVITAMIN) tablet Take 1 tablet by mouth daily.  . ondansetron (ZOFRAN ODT) 4 MG disintegrating tablet Take 1 tablet (4 mg total) by mouth every 8 (eight) hours as needed for nausea or vomiting.  . predniSONE (DELTASONE) 10 MG tablet Take 4 tabs  daily with food x 4 days, then 3 tabs daily x 4 days, then 2 tabs daily x 4 days, then 1 tab daily x4 days then stop. #40  . Probiotic Product (PROBIOTIC DAILY PO) Take 1 tablet by mouth daily.  . [DISCONTINUED] Alum & Mag Hydroxide-Simeth (MAGIC MOUTHWASH) SOLN Take 5 mLs by mouth 3 (three) times daily as needed. Thrush    No current facility-administered medications on file prior to visit.      Allergies  Allergen Reactions  . Biaxin [Clarithromycin] Other (See Comments)    Bitter taste  . Ciprofloxacin Rash  . Codeine Nausea And Vomiting  . Dilantin [Phenytoin] Rash  . Doxycycline Nausea Only    Very  nauseated  . Lactose Intolerance (Gi) Other (See Comments)    gas  . Restasis [Cyclosporine] Other (See Comments)    Eyes burn   . Sulfonamide Derivatives Rash    Review Of Systems:  Constitutional:   No  weight loss, night sweats,  Fevers, chills,++ fatigue, or  lassitude.  HEENT:   No headaches,  Difficulty swallowing,  Tooth/dental problems, or  Sore throat,                No sneezing, itching, ear ache, nasal congestion, post nasal drip,   CV:  No chest pain,  Orthopnea, PND, swelling in lower extremities, anasarca, dizziness, palpitations, syncope.   GI  No heartburn, indigestion, abdominal pain, nausea, vomiting, diarrhea, change in bowel habits, loss of appetite, bloody stools.   Resp: No  shortness of breath with exertion or at rest.  +excess mucus, + productive cough,  No non-productive cough,  No coughing up of blood.  No change in color of mucus.  No wheezing.  No chest wall deformity  Skin: no rash or lesions.  GU: no dysuria, change in color of urine, no urgency or frequency.  No flank pain, no hematuria   MS:  No joint pain or swelling.  No decreased range of motion.  No back pain.  Psych:  No change in mood or affect. No depression or anxiety.  No memory loss.   Vital Signs BP 124/66 (BP Location: Left Arm, Cuff Size: Normal)   Pulse (!) 103   Ht 5' 3.5" (1.613 m)   Wt 126 lb (57.2 kg)   SpO2 96%   BMI 21.97 kg/m    Physical Exam:  General- No distress,  A&Ox3, pleasant ENT: No sinus tenderness, TM clear, pale nasal mucosa, no oral exudate,no post nasal drip, no LAN Cardiac: S1, S2, regular rate and rhythm, no murmur Chest: No wheeze/ rales/ dullness; no accessory muscle use, no nasal flaring, no sternal retractions, rhonchi throughout Abd.: Soft Non-tender, flat Ext: No clubbing cyanosis, edema Neuro:  normal strength, very tired and fatigued Skin: No rashes, warm and dry Psych: normal mood and behavior, appropriately concerned about how ill she has  been  Body mass index is 21.97 kg/m.  Assessment/Plan  Asthma with acute exacerbation Resolving asthma exacerbation triggered by influenza a period Continue congestion Plan Continue to use your Flovent inhaler 2 puffs twice daily. Continue to use your Albuterol nebulizer treatments as needed for shortness of breath or wheezing.up to 4 times daily. Continue Mucinex every 12 hours as you have been doing. Add Flutter Valve 4 times every 4-6 hours while awake. Z Pack  Eat Yogurt while on antibiotic Continue your Zantac daily as you have been doing. Continue Robitussin or Tessalon for cough as you have been doing. Rest and drink plenty of fluids. Follow up in 2 weeks with Dr. Annamaria Boots or Eric Form, NP Please contact office for sooner follow up if symptoms do not improve or worsen or seek emergency care       Magdalen Spatz, NP 06/22/2016  5:52 PM

## 2016-06-22 NOTE — Patient Instructions (Addendum)
It is nice to see you today. I am glad you are looking better. Continue to use your Flovent inhaler 2 puffs twice daily. Continue to use your Albuterol nebulizer treatments as needed for shortness of breath or wheezing.up to 4 times daily. Continue Mucinex every 12 hours as you have been doing. Add Flutter Valve 4 times every 4-6 hours while awake. Z Pack  Eat Yogurt while on antibiotic Continue your Zantac daily as you have been doing. Continue Robitussin or Tessalon for cough as you have been doing. Rest and drink plenty of fluids. Follow up in 2 weeks with Dr. Annamaria Boots or Eric Form, NP Please contact office for sooner follow up if symptoms do not improve or worsen or seek emergency care

## 2016-06-22 NOTE — Telephone Encounter (Signed)
Records received from [facility] -- Records put with Rhonda's schedule for 06/26/16. lp

## 2016-06-22 NOTE — Assessment & Plan Note (Signed)
Resolving asthma exacerbation triggered by influenza a period Continue congestion Plan Continue to use your Flovent inhaler 2 puffs twice daily. Continue to use your Albuterol nebulizer treatments as needed for shortness of breath or wheezing.up to 4 times daily. Continue Mucinex every 12 hours as you have been doing. Add Flutter Valve 4 times every 4-6 hours while awake. Z Pack  Eat Yogurt while on antibiotic Continue your Zantac daily as you have been doing. Continue Robitussin or Tessalon for cough as you have been doing. Rest and drink plenty of fluids. Follow up in 2 weeks with Dr. Annamaria Boots or Eric Form, NP Please contact office for sooner follow up if symptoms do not improve or worsen or seek emergency care

## 2016-06-24 ENCOUNTER — Telehealth: Payer: Self-pay | Admitting: Internal Medicine

## 2016-06-24 MED ORDER — ALBUTEROL SULFATE (2.5 MG/3ML) 0.083% IN NEBU: 2.5000 mg | INHALATION_SOLUTION | Freq: Four times a day (QID) | RESPIRATORY_TRACT | 5 refills | Status: DC | PRN

## 2016-06-24 NOTE — Telephone Encounter (Signed)
Spoke with pt. She is needing a refill on albuterol neb solution. Rx has been sent in. Nothing further was needed. 

## 2016-06-26 ENCOUNTER — Ambulatory Visit (INDEPENDENT_AMBULATORY_CARE_PROVIDER_SITE_OTHER): Payer: Medicare Other | Admitting: Physician Assistant

## 2016-06-26 ENCOUNTER — Encounter: Payer: Self-pay | Admitting: Physician Assistant

## 2016-06-26 VITALS — BP 112/68 | HR 87 | Ht 63.5 in | Wt 126.2 lb

## 2016-06-26 DIAGNOSIS — R0602 Shortness of breath: Secondary | ICD-10-CM | POA: Diagnosis not present

## 2016-06-26 DIAGNOSIS — R0609 Other forms of dyspnea: Secondary | ICD-10-CM | POA: Diagnosis not present

## 2016-06-26 NOTE — Patient Instructions (Addendum)
Medication Instructions:   Your physician recommends that you continue on your current medications as directed. Please refer to the Current Medication list given to you today.   If you need a refill on your cardiac medications before your next appointment, please call your pharmacy.  Labwork: NONE ORDERED  TODAY'   Testing/Procedures: NONE ORDERED  TODAY    Follow-Up:  IN 3 MONTHS WITH DR BERRY    Any Other Special Instructions Will Be Listed Below (If Applicable).   YOU MAY CONTINUE TO INCREASE ACTIVITY SLOWLY

## 2016-06-26 NOTE — Progress Notes (Signed)
Cardiology Office Note   Date:  06/26/2016   ID:  Sierra [name], DOB October 31, 1928, MRN [id number]  PCP:  Geoffery Lyons, MD  Cardiologist:  Dr. Gwenlyn Found 01/14/2016  Rosaria Ferries, PA-C   Chief Complaint  Patient presents with  . Shortness of Breath  . Edema    feet    History of Present Illness: Sierra [name] is a 83 y.o. female with a history of DOE (MV and echo nl), asthma complicated by allergic rhinitis and bronchitis on chronic steroids, GERD and atypical AFB; adrenal insufficiency since her brain tumor was removed, chronic prednisone  Admit 02/01-02/05 for asthma exacerbation, influenza A and respiratory distress. 02/12 office visit patient still covering from the asthma exacerbation and influenza. Continue current therapy, two-week follow-up recommended.  Sierra [name] presents for cardiology evaluation  Since d/c from the hospital, she has felt weak. She feels ok sitting still, but is still coughing, slight amount clear mucus. Breathing is raspy at times, some wheezing. Albuterol helps, also on Mucinex. She has almost completed the steroid taper. No fever. She is concerned about her respiratory status, feels she is not getting better very fast.   She has daytime LE edema, does not wake with it.   Her breathing has not improved much since d/c. She is up and about more than after d/c, but is slower. She feels weak pretty easily.   She wishes she were able to do more, but admits that she has improved albeit slowly, since discharge.   Past Medical History:  Diagnosis Date  . Acute asthmatic bronchitis   . Allergic rhinitis   . Arthritis    Hx R shoulder bursitis, pain L knee and L hip  . Asthmatic bronchitis   . Balance problem    SINCE BRAIN TUMOR REMOVED IN 2002-BENIGN TUMOR-PT HAS ADRENAL INSUFFICIENCY AND TAKES DAILY PREDNISONE  . Blood transfusion   . Cardiac murmur    DOES NOT CAUSE ANY SYMPTOMS  . Chronic kidney disease   . Chronic pharyngitis   .  Diverticulitis, colon   . Dizziness   . DOE (dyspnea on exertion)   . Esophageal reflux   . GI bleed 01/06/2013  . Hyperlipemia   . Hypertension   . Hypothyroidism   . IBS (irritable bowel syndrome)   . Leukocytosis   . Other specified iron deficiency anemias   . Recurrent upper respiratory infection (URI) 07/10/2011    ACUTE BRONCHITIS - extra prednisone in addition to the daily prednisone and a Z-Pak  . Thrombocytosis ([place])   . Ulceration, colon   . Ulcerative colitis     Past Surgical History:  Procedure Laterality Date  . ABDOMINAL HYSTERECTOMY  1985  . APPENDECTOMY    . COLONOSCOPY N/A 01/05/2013   Procedure: COLONOSCOPY;  Surgeon: Cleotis Nipper, MD;  Location: [facility] ENDOSCOPY;  Service: Endoscopy;  Laterality: N/A;  . COLONOSCOPY N/A 01/04/2014   Procedure: COLONOSCOPY;  Surgeon: Winfield Cunas., MD;  Location: [facility] ENDOSCOPY;  Service: Endoscopy;  Laterality: N/A;  . CRANIOTOMY    . DILATION AND CURETTAGE OF UTERUS  1967  . ESOPHAGOGASTRODUODENOSCOPY N/A 01/05/2013   Procedure: ESOPHAGOGASTRODUODENOSCOPY (EGD);  Surgeon: Cleotis Nipper, MD;  Location: [facility] ENDOSCOPY;  Service: Endoscopy;  Laterality: N/A;  . ESOPHAGOGASTRODUODENOSCOPY N/A 06/26/2014   Procedure: ESOPHAGOGASTRODUODENOSCOPY (EGD);  Surgeon: Winfield Cunas., MD;  Location: [facility] ENDOSCOPY;  Service: Endoscopy;  Laterality: N/A;  . EYE SURGERY     2003 -RIGHT CATARACT EXTRACTED AND LEFT  WAS DONE IN 2004  . FLEXIBLE SIGMOIDOSCOPY N/A 06/26/2014   Procedure: FLEXIBLE SIGMOIDOSCOPY;  Surgeon: Winfield Cunas., MD;  Location[facility] ENDOSCOPY;  Service: Endoscopy;  Laterality: N/A;  . FRONTALIS SUSPENSION  10-09-2010   lifting eyelids AND SECOND EYE SURGERY November 19, 2010  . meningioma resected  20O2  . PARTIAL COLECTOMY  2008  . subglottal mucocoel  2000  . THYROIDECTOMY  1986  . TONSILLECTOMY  1938  . TOTAL HIP ARTHROPLASTY  OCT 2006   right  . TOTAL HIP ARTHROPLASTY  07/27/2011   Procedure: TOTAL HIP  ARTHROPLASTY;  Surgeon: Gearlean Alf, MD;  Location: [facility] ORS;  Service: Orthopedics;  Laterality: Left;  Marland Kitchen VESICOVAGINAL FISTULA CLOSURE W/ TAH    . WRIST TENDON LESION REMOVED 2007      Medication Sig  . albuterol (PROVENTIL HFA;VENTOLIN HFA) 108 (90 Base) MCG/ACT inhaler Inhale 2 puffs into the lungs every 6 (six) hours as needed for wheezing or shortness of breath.  Marland Kitchen albuterol (PROVENTIL) (2.5 MG/3ML) 0.083% nebulizer solution Take 3 mLs (2.5 mg total) by nebulization every 6 (six) hours as needed for wheezing or shortness of breath.  Marland Kitchen amLODipine (NORVASC) 2.5 MG tablet Take 2.5 mg by mouth daily after breakfast.  . Artificial Tear Solution (BION TEARS) 0.1-0.3 % SOLN Place 1 drop into both eyes every morning.   . Artificial Tear Solution (GENTEAL TEARS) 0.1-0.2-0.3 % SOLN Place 1 drop into both eyes at bedtime.  Marland Kitchen azithromycin (ZITHROMAX) 250 MG tablet Take 2 today, then 1 daily until gone.  . B Complex Vitamins (VITAMIN B COMPLEX PO) Take 1 tablet by mouth daily.  . benzonatate (TESSALON) 100 MG capsule Take 1 capsule (100 mg total) by mouth 3 (three) times daily as needed for cough.  Marland Kitchen BIOTIN PO Take 1 tablet by mouth daily.  . cholecalciferol (VITAMIN D) 1000 UNITS tablet Take 1,000 Units by mouth daily.  . Cyanocobalamin (VITAMIN B-12) 2500 MCG SUBL Place 2,500 mcg under the tongue daily.  . famotidine (PEPCID) 20 MG tablet Take 20 mg by mouth daily.   . fluticasone (FLOVENT HFA) 110 MCG/ACT inhaler Inhale 2 puffs into the lungs 2 (two) times daily.  Marland Kitchen guaiFENesin (ROBITUSSIN) 100 MG/5ML SOLN Take 5 mLs (100 mg total) by mouth every 4 (four) hours as needed for cough or to loosen phlegm.  . lactase (LACTAID) 3000 UNITS tablet Take 3,000 Units by mouth daily as needed (lactose intolerant).  Marland Kitchen levothyroxine (SYNTHROID, LEVOTHROID) 75 MCG tablet Take 75 mcg by mouth daily before breakfast.   . LORazepam (ATIVAN) 0.5 MG tablet Take 1 tablet (0.5 mg total) by mouth at bedtime.  .  Multiple Vitamin (MULTIVITAMIN) tablet Take 1 tablet by mouth daily.  . ondansetron (ZOFRAN ODT) 4 MG disintegrating tablet Take 1 tablet (4 mg total) by mouth every 8 (eight) hours as needed for nausea or vomiting.  . predniSONE (DELTASONE) 10 MG tablet Take 4 tabs  daily with food x 4 days, then 3 tabs daily x 4 days, then 2 tabs daily x 4 days, then 1 tab daily x4 days then stop. #40  . Probiotic Product (PROBIOTIC DAILY PO) Take 1 tablet by mouth daily.   No current facility-administered medications for this visit.     Allergies:   Biaxin [clarithromycin]; Ciprofloxacin; Codeine; Dilantin [phenytoin]; Doxycycline; Lactose intolerance (gi); Restasis [cyclosporine]; and Sulfonamide derivatives    Social History:  The patient  reports that she has never smoked. She has never used smokeless tobacco. She reports  that she drinks alcohol. She reports that she does not use drugs.   Family History:  The patient's family history includes Heart attack in her father.    ROS:  Please see the history of present illness. All other systems are reviewed and negative.    PHYSICAL EXAM: VS:  BP 112/68   Pulse 87   Ht 5' 3.5" (1.613 m)   Wt 126 lb 3.2 oz (57.2 kg)   BMI 22.00 kg/m  , BMI Body mass index is 22 kg/m. GEN: Well nourished, well developed, female in no acute distress  HEENT: normal for age  Neck: no JVD, no carotid bruit, no masses Cardiac: RRR; Soft murmur, no rubs, or gallops Respiratory: No wheeze, few rales bases, rhonchi noted on the left, normal work of breathing GI: soft, nontender, nondistended, + BS MS: no deformity or atrophy; no edema; distal pulses are 2+ in all 4 extremities   Skin: warm and dry, no rash Neuro:  Strength and sensation are intact Psych: euthymic mood, full affect   EKG:  EKG is ordered today. The ekg ordered today demonstrates sinus rhythm, heart rate 87, no acute ischemic changes, possible sinus arrhythmia   Recent Labs: 10/25/2015: B Natriuretic  Peptide 40.9 06/11/2016: ALT 20; Magnesium 2.8 06/12/2016: BUN 23; Creatinine, Ser 0.86; Hemoglobin 10.9; Platelets 284; Potassium 3.8; Sodium 137    Lipid Panel No results found for: CHOL, TRIG, HDL, CHOLHDL, VLDL, LDLCALC, LDLDIRECT   Wt Readings from Last 3 Encounters:  06/26/16 126 lb 3.2 oz (57.2 kg)  06/22/16 126 lb (57.2 kg)  06/11/16 129 lb 3.2 oz (58.6 kg)     Other studies Reviewed: Additional studies/ records that were reviewed today include: Office notes, hospital records and testing.  ASSESSMENT AND PLAN:  1.  Dyspnea on exertion: Sierra [name] is fine as long as she sits still. Her exercise capacity is decreased since her recent illness. Her lung sounds are pretty rough on exam, but I do not find volume overload. She is encouraged to continue her respiratory medications and follow-up with Pulmonary as scheduled.  She is encouraged to increase her activity slowly and report if she does not get any improvement.  2. Possible CAD: She is currently having no ischemic symptoms. Previous Myoview and echocardiogram were normal. I reassured the patient and her husband that we had no evidence that she had any significant coronary artery disease. Reports symptoms.   Current medicines are reviewed at length with the patient today.  The patient does not have concerns regarding medicines.  The following changes have been made:  no change  Labs/ tests ordered today include:  No orders of the defined types were placed in this encounter.    Disposition:   FU with Dr. Gwenlyn Found  Signed, Melchizedek Espinola, Suanne Marker, PA-C  06/26/2016 10:12 AM    [place] Phone: [phone]; Fax: [phone]  This note was written with the assistance of speech recognition software. Please excuse any transcriptional errors.

## 2016-06-30 ENCOUNTER — Ambulatory Visit (INDEPENDENT_AMBULATORY_CARE_PROVIDER_SITE_OTHER): Payer: Medicare Other | Admitting: Adult Health

## 2016-06-30 ENCOUNTER — Encounter: Payer: Self-pay | Admitting: Adult Health

## 2016-06-30 DIAGNOSIS — J45909 Unspecified asthma, uncomplicated: Secondary | ICD-10-CM

## 2016-06-30 MED ORDER — BUDESONIDE-FORMOTEROL FUMARATE 160-4.5 MCG/ACT IN AERO: 2.0000 | INHALATION_SPRAY | Freq: Two times a day (BID) | RESPIRATORY_TRACT | 0 refills | Status: DC

## 2016-06-30 MED ORDER — BUDESONIDE-FORMOTEROL FUMARATE 160-4.5 MCG/ACT IN AERO: 2.0000 | INHALATION_SPRAY | Freq: Two times a day (BID) | RESPIRATORY_TRACT | 5 refills | Status: DC

## 2016-06-30 NOTE — Patient Instructions (Addendum)
Increase Prednisone 20mg  daily for 1 week then back to 10mg  daily .  Mucinex DM Twice daily  As needed  Cough/congestion .  Tessalon Three times a day  As needed  Cough .  Change Flovent to Symbicort 2 puffs Twice daily  , rinse after use .  Flutter valve As needed  For congestion .  Continue on Zantac .Twice daily   Follow up next week as planned and As needed   Please contact office for sooner follow up if symptoms do not improve or worsen or seek emergency care

## 2016-06-30 NOTE — Progress Notes (Signed)
@Patient  ID: Sierra [name], female    DOB: 1928-11-04, 83 y.o.   MRN: [id number]  Chief Complaint  Patient presents with  . Follow-up    Asthma     Referring provider: Burnard Bunting, MD  HPI: 83 yo female followed for asthma , AR complicated by GERD .  Hx of Atypical AFB .  On chronic steroids prednisone 10mg  daily   06/30/2016 Follow up : Asthma  She returns for one-week follow-up. Patient's been having difficulty over the last 3 weeks. She began with flulike symptoms and was admitted to the hospital February 2 through February 5 for influenza A with an asthma exacerbation. She was treated with Tamiflu, systemic steroids in scheduled bronchodilators. Since discharge. Patient says she's been having lingering cough, congestion, and intermittent wheezing. She was seen in the office last week and given a Z-Pak and recommendations continue on Mucinex, Robitussin. She says the congestion is more clear and white, but she still has intermittent wheezing and the cough is wearing her out. She is on Flovent twice daily. She denies any hemoptysis, orthopnea, PND, or increased leg swelling.   Allergies  Allergen Reactions  . Biaxin [Clarithromycin] Other (See Comments)    Bitter taste  . Ciprofloxacin Rash  . Codeine Nausea And Vomiting  . Dilantin [Phenytoin] Rash  . Doxycycline Nausea Only    Very nauseated  . Lactose Intolerance (Gi) Other (See Comments)    gas  . Restasis [Cyclosporine] Other (See Comments)    Eyes burn   . Sulfonamide Derivatives Rash    Immunization History  Administered Date(s) Administered  . H1N1 05/01/2008  . Influenza Split 01/29/2011, 01/25/2012, 01/09/2013, 01/10/2015  . Influenza Whole 02/08/2009, 02/08/2010  . Influenza, High Dose Seasonal PF 01/06/2016  . Influenza-Unspecified 02/08/2014  . Pneumococcal Polysaccharide-23 01/10/2004, 03/11/2013  . Zoster 01/31/2008    Past Medical History:  Diagnosis Date  . Acute asthmatic bronchitis   .  Allergic rhinitis   . Arthritis    Hx R shoulder bursitis, pain L knee and L hip  . Asthmatic bronchitis   . Balance problem    SINCE BRAIN TUMOR REMOVED IN 2002-BENIGN TUMOR-PT HAS ADRENAL INSUFFICIENCY AND TAKES DAILY PREDNISONE  . Blood transfusion   . Cardiac murmur    DOES NOT CAUSE ANY SYMPTOMS  . Chronic kidney disease   . Chronic pharyngitis   . Diverticulitis, colon   . Dizziness   . DOE (dyspnea on exertion)   . Esophageal reflux   . GI bleed 01/06/2013  . Hyperlipemia   . Hypertension   . Hypothyroidism   . IBS (irritable bowel syndrome)   . Leukocytosis   . Other specified iron deficiency anemias   . Recurrent upper respiratory infection (URI) 07/10/2011    ACUTE BRONCHITIS - extra prednisone in addition to the daily prednisone and a Z-Pak  . Thrombocytosis ([place])   . Ulceration, colon   . Ulcerative colitis     Tobacco History: History  Smoking Status  . Never Smoker  Smokeless Tobacco  . Never Used   Counseling given: Not Answered   Outpatient Encounter Prescriptions as of 06/30/2016  Medication Sig  . albuterol (PROVENTIL HFA;VENTOLIN HFA) 108 (90 Base) MCG/ACT inhaler Inhale 2 puffs into the lungs every 6 (six) hours as needed for wheezing or shortness of breath.  Marland Kitchen albuterol (PROVENTIL) (2.5 MG/3ML) 0.083% nebulizer solution Take 3 mLs (2.5 mg total) by nebulization every 6 (six) hours as needed for wheezing or shortness of breath.  Marland Kitchen amLODipine (  NORVASC) 2.5 MG tablet Take 2.5 mg by mouth daily after breakfast.  . Artificial Tear Solution (BION TEARS) 0.1-0.3 % SOLN Place 1 drop into both eyes every morning.   . Artificial Tear Solution (GENTEAL TEARS) 0.1-0.2-0.3 % SOLN Place 1 drop into both eyes at bedtime.  . B Complex Vitamins (VITAMIN B COMPLEX PO) Take 1 tablet by mouth daily.  . benzonatate (TESSALON) 100 MG capsule Take 1 capsule (100 mg total) by mouth 3 (three) times daily as needed for cough.  Marland Kitchen BIOTIN PO Take 1 tablet by mouth daily.  .  cholecalciferol (VITAMIN D) 1000 UNITS tablet Take 1,000 Units by mouth daily.  . Cyanocobalamin (VITAMIN B-12) 2500 MCG SUBL Place 2,500 mcg under the tongue daily.  . fluticasone (FLOVENT HFA) 110 MCG/ACT inhaler Inhale 2 puffs into the lungs 2 (two) times daily.  Marland Kitchen lactase (LACTAID) 3000 UNITS tablet Take 3,000 Units by mouth daily as needed (lactose intolerant).  Marland Kitchen levothyroxine (SYNTHROID, LEVOTHROID) 75 MCG tablet Take 75 mcg by mouth daily before breakfast.   . LORazepam (ATIVAN) 0.5 MG tablet Take 1 tablet by mouth at bedtime as needed.  . Multiple Vitamin (MULTIVITAMIN) tablet Take 1 tablet by mouth daily.  . predniSONE (DELTASONE) 10 MG tablet Take 10 mg by mouth daily with breakfast.  . Probiotic Product (PROBIOTIC DAILY PO) Take 1 tablet by mouth daily.  . ranitidine (ZANTAC) 150 MG tablet Take 150 mg by mouth at bedtime.  . budesonide-formoterol (SYMBICORT) 160-4.5 MCG/ACT inhaler Inhale 2 puffs into the lungs 2 (two) times daily.  . budesonide-formoterol (SYMBICORT) 160-4.5 MCG/ACT inhaler Inhale 2 puffs into the lungs 2 (two) times daily.  . [DISCONTINUED] predniSONE (DELTASONE) 10 MG tablet Take 4 tabs  daily with food x 4 days, then 3 tabs daily x 4 days, then 2 tabs daily x 4 days, then 1 tab daily x4 days then stop. #40 (Patient not taking: Reported on 06/30/2016)   No facility-administered encounter medications on file as of 06/30/2016.      Review of Systems  Constitutional:   No  weight loss, night sweats,  Fevers, chills, + fatigue, or  lassitude.  HEENT:   No headaches,  Difficulty swallowing,  Tooth/dental problems, or  Sore throat,                No sneezing, itching, ear ache,  +nasal congestion, post nasal drip,   CV:  No chest pain,  Orthopnea, PND, swelling in lower extremities, anasarca, dizziness, palpitations, syncope.   GI  No heartburn, indigestion, abdominal pain, nausea, vomiting, diarrhea, change in bowel habits, loss of appetite, bloody stools.    Resp:    No chest wall deformity  Skin: no rash or lesions.  GU: no dysuria, change in color of urine, no urgency or frequency.  No flank pain, no hematuria   MS:  No joint pain or swelling.  No decreased range of motion.  No back pain.    Physical Exam  BP 122/80 (BP Location: Left Arm, Cuff Size: Normal)   Pulse 98   Ht 5' 3.5" (1.613 m)   Wt 127 lb 9.6 oz (57.9 kg)   SpO2 95%   BMI 22.25 kg/m   GEN: A/Ox3; pleasant , NAD, elderly and frail    HEENT:  [place]/AT,  EACs-clear, TMs-wnl, NOSE-clear, THROAT-clear, no lesions, no postnasal drip or exudate noted.   NECK:  Supple w/ fair ROM; no JVD; normal carotid impulses w/o bruits; no thyromegaly or nodules palpated; no lymphadenopathy.  RESP  Few rhonchi , barking cough ,  no accessory muscle use, no dullness to percussion  CARD:  RRR, no m/r/g, no peripheral edema, pulses intact, no cyanosis or clubbing.  GI:   Soft & nt; nml bowel sounds; no organomegaly or masses detected.   Musco: Warm bil, no deformities or joint swelling noted.   Neuro: alert, no focal deficits noted.    Skin: Warm, no lesions or rashes    Lab Results:  CBC  Imaging: X-ray Chest Pa And Lateral  Result Date: 06/11/2016 CLINICAL DATA:  Asthma. EXAM: CHEST  2 VIEW COMPARISON:  06/10/2016.  10/25/2015.  CT 10/25/2015. FINDINGS: Mediastinum and hilar structures normal. Heart size stable. Stable fat pads. Stable left base pleuroparenchymal scarring. No pneumothorax. No acute bony abnormality . IMPRESSION: No acute cardiopulmonary disease. Electronically Signed   By: Marcello Moores  Register   On: 06/11/2016 16:12   Dg Chest 2 View  Result Date: 06/10/2016 CLINICAL DATA:  Worsening cough.  Fever.  Dyspnea. EXAM: CHEST  2 VIEW COMPARISON:  10/25/2015 chest radiograph. FINDINGS: Stable cardiomediastinal silhouette with top-normal heart size and aortic atherosclerosis. No pneumothorax. No pleural effusion. Lungs appear clear, with no acute consolidative  airspace disease and no pulmonary edema. IMPRESSION: No active cardiopulmonary disease. Electronically Signed   By: Ilona Sorrel M.D.   On: 06/10/2016 11:48     Assessment & Plan:   Asthma with bronchitis Slow to resolve exacerbation after Influenza  Change to ICS/LABA and brief steroid burst  Control for cough triggers.   Plan  Patient Instructions  Increase Prednisone 20mg  daily for 1 week then back to 10mg  daily .  Mucinex DM Twice daily  As needed  Cough/congestion .  Tessalon Three times a day  As needed  Cough .  Change Flovent to Symbicort 2 puffs Twice daily  , rinse after use .  Flutter valve As needed  For congestion .  Continue on Zantac .Twice daily   Follow up next week as planned and As needed   Please contact office for sooner follow up if symptoms do not improve or worsen or seek emergency care         Rexene Edison, NP 06/30/2016

## 2016-06-30 NOTE — Assessment & Plan Note (Signed)
Slow to resolve exacerbation after Influenza  Change to ICS/LABA and brief steroid burst  Control for cough triggers.   Plan  Patient Instructions  Increase Prednisone 20mg  daily for 1 week then back to 10mg  daily .  Mucinex DM Twice daily  As needed  Cough/congestion .  Tessalon Three times a day  As needed  Cough .  Change Flovent to Symbicort 2 puffs Twice daily  , rinse after use .  Flutter valve As needed  For congestion .  Continue on Zantac .Twice daily   Follow up next week as planned and As needed   Please contact office for sooner follow up if symptoms do not improve or worsen or seek emergency care

## 2016-07-06 ENCOUNTER — Ambulatory Visit: Payer: Medicare Other | Admitting: Acute Care

## 2016-07-09 ENCOUNTER — Encounter: Payer: Self-pay | Admitting: Acute Care

## 2016-07-09 ENCOUNTER — Ambulatory Visit (INDEPENDENT_AMBULATORY_CARE_PROVIDER_SITE_OTHER)
Admission: RE | Admit: 2016-07-09 | Discharge: 2016-07-09 | Disposition: A | Discharge status: Home / Self Care | Payer: Medicare Other | Source: Ambulatory Visit | Attending: Acute Care | Admitting: Acute Care

## 2016-07-09 ENCOUNTER — Ambulatory Visit (INDEPENDENT_AMBULATORY_CARE_PROVIDER_SITE_OTHER): Payer: Medicare Other | Admitting: Acute Care

## 2016-07-09 VITALS — BP 140/70 | HR 96 | Ht 63.5 in | Wt 127.6 lb

## 2016-07-09 DIAGNOSIS — R059 Cough, unspecified: Secondary | ICD-10-CM

## 2016-07-09 DIAGNOSIS — J45909 Unspecified asthma, uncomplicated: Secondary | ICD-10-CM

## 2016-07-09 DIAGNOSIS — R05 Cough: Secondary | ICD-10-CM

## 2016-07-09 MED ORDER — BENZONATATE 100 MG PO CAPS: 100.0000 mg | ORAL_CAPSULE | Freq: Three times a day (TID) | ORAL | 0 refills | Status: DC | PRN

## 2016-07-09 MED ORDER — FLUTTER DEVI: 1.0000 | 0 refills | Status: DC | PRN

## 2016-07-09 NOTE — Progress Notes (Signed)
History of Present Illness Sierra [name] is a 83 y.o. female followed for asthma complicated by allergic rhinitis and bronchitis, GERD and HX of atypical AFB. She is on chronic prednisone of 10 mg daily. She is followed by Dr. Annamaria Boots  .   07/09/2016 Follow Up Visit: Pt. Returns today for follow up visit. She has had bronchitis and chest congestion x 5 weeks. She was hospitalized 06/11/2016-06/15/2016 for influenza A  with exacerbation of her asthma. She was treated with Tamiflu, systemic steroids in scheduled bronchodilators. Since discharge. Patient says she's been having lingering cough, congestion, and intermittent wheezing. She was seen in the office 2/12  and given a Z-Pak and recommendations continue on Mucinex, Robitussin. She says the congestion is more clear and white, but she still has intermittent wheezing and the cough is wearing her out.She was seen in the office again 06/30/2016 and her prednisone was increased to 20 mg daily x 7 days, and her Flovent was changed to Symbicort.She states that she does have a Flutter valve, but she has not been using it 4 times daily.She does not note any wheezing. She does have some lower extremity edema today R>L.  She is compliant with Tessalon, Albuterol neb, Symbicort. She is taking Mucinex in the morning  She has continued to complain of cough, and chest congestion. She has some light green secretions, not large amount. She has no fever, chest pain, orthopnea or hemoptysis.She is back to her maintenance dose of 10 mg prednisone daily. Her appetite is good, and she is less fatigued..She is asking if she can resume some of her church meetings.She states she is actually breathing better now than she has in several weeks.  Tests CXR 07/09/2016>> Reviewed personally by me  Mild chronic bronchitic changes, stable. No acute cardiopulmonary abnormality.  Past medical hx Past Medical History:  Diagnosis Date  . Acute asthmatic bronchitis   . Allergic  rhinitis   . Arthritis    Hx R shoulder bursitis, pain L knee and L hip  . Asthmatic bronchitis   . Balance problem    SINCE BRAIN TUMOR REMOVED IN 2002-BENIGN TUMOR-PT HAS ADRENAL INSUFFICIENCY AND TAKES DAILY PREDNISONE  . Blood transfusion   . Cardiac murmur    DOES NOT CAUSE ANY SYMPTOMS  . Chronic kidney disease   . Chronic pharyngitis   . Diverticulitis, colon   . Dizziness   . DOE (dyspnea on exertion)   . Esophageal reflux   . GI bleed 01/06/2013  . Hyperlipemia   . Hypertension   . Hypothyroidism   . IBS (irritable bowel syndrome)   . Leukocytosis   . Other specified iron deficiency anemias   . Recurrent upper respiratory infection (URI) 07/10/2011    ACUTE BRONCHITIS - extra prednisone in addition to the daily prednisone and a Z-Pak  . Thrombocytosis ([place])   . Ulceration, colon   . Ulcerative colitis      Past surgical hx, Family hx, Social hx all reviewed.  Current Outpatient Prescriptions on File Prior to Visit  Medication Sig  . albuterol (PROVENTIL HFA;VENTOLIN HFA) 108 (90 Base) MCG/ACT inhaler Inhale 2 puffs into the lungs every 6 (six) hours as needed for wheezing or shortness of breath.  Marland Kitchen albuterol (PROVENTIL) (2.5 MG/3ML) 0.083% nebulizer solution Take 3 mLs (2.5 mg total) by nebulization every 6 (six) hours as needed for wheezing or shortness of breath.  Marland Kitchen amLODipine (NORVASC) 2.5 MG tablet Take 2.5 mg by mouth daily after breakfast.  . Artificial Tear  Solution (BION TEARS) 0.1-0.3 % SOLN Place 1 drop into both eyes every morning.   . Artificial Tear Solution (GENTEAL TEARS) 0.1-0.2-0.3 % SOLN Place 1 drop into both eyes at bedtime.  . B Complex Vitamins (VITAMIN B COMPLEX PO) Take 1 tablet by mouth daily.  . benzonatate (TESSALON) 100 MG capsule Take 1 capsule (100 mg total) by mouth 3 (three) times daily as needed for cough.  Marland Kitchen BIOTIN PO Take 1 tablet by mouth daily.  . budesonide-formoterol (SYMBICORT) 160-4.5 MCG/ACT inhaler Inhale 2 puffs into the  lungs 2 (two) times daily.  . cholecalciferol (VITAMIN D) 1000 UNITS tablet Take 1,000 Units by mouth daily.  . Cyanocobalamin (VITAMIN B-12) 2500 MCG SUBL Take 2,500 mcg by mouth daily.   Marland Kitchen lactase (LACTAID) 3000 UNITS tablet Take 3,000 Units by mouth daily as needed (lactose intolerant).  Marland Kitchen levothyroxine (SYNTHROID, LEVOTHROID) 75 MCG tablet Take 75 mcg by mouth daily before breakfast.   . LORazepam (ATIVAN) 0.5 MG tablet Take 1 tablet by mouth at bedtime as needed.  . Multiple Vitamin (MULTIVITAMIN) tablet Take 1 tablet by mouth daily.  . predniSONE (DELTASONE) 10 MG tablet Take 10 mg by mouth daily with breakfast.  . Probiotic Product (PROBIOTIC DAILY PO) Take 1 tablet by mouth daily.  . ranitidine (ZANTAC) 150 MG tablet Take 150 mg by mouth at bedtime.  . budesonide-formoterol (SYMBICORT) 160-4.5 MCG/ACT inhaler Inhale 2 puffs into the lungs 2 (two) times daily.  . fluticasone (FLOVENT HFA) 110 MCG/ACT inhaler Inhale 2 puffs into the lungs 2 (two) times daily.  . [DISCONTINUED] Alum & Mag Hydroxide-Simeth (MAGIC MOUTHWASH) SOLN Take 5 mLs by mouth 3 (three) times daily as needed. Thrush    No current facility-administered medications on file prior to visit.      Allergies  Allergen Reactions  . Biaxin [Clarithromycin] Other (See Comments)    Bitter taste  . Ciprofloxacin Rash  . Codeine Nausea And Vomiting  . Dilantin [Phenytoin] Rash  . Doxycycline Nausea Only    Very nauseated  . Lactose Intolerance (Gi) Other (See Comments)    gas  . Restasis [Cyclosporine] Other (See Comments)    Eyes burn   . Sulfonamide Derivatives Rash    Review Of Systems:  Constitutional:   No  weight loss, night sweats,  Fevers, chills,+ fatigue, or  lassitude.  HEENT:   No headaches,  Difficulty swallowing,  Tooth/dental problems, or  Sore throat,                No sneezing, itching, ear ache, nasal congestion, post nasal drip,   CV:  No chest pain,  Orthopnea, PND, swelling in lower  extremities, anasarca, dizziness, palpitations, syncope.   GI  No heartburn, indigestion, abdominal pain, nausea, vomiting, diarrhea, change in bowel habits, loss of appetite, bloody stools.   Resp: No shortness of breath with exertion or at rest.  Minimal excess mucus, no productive cough,  + non-productive cough,  No coughing up of blood.  + change in color of mucus.  Rare wheezing.  No chest wall deformity  Skin: no rash or lesions.  GU: no dysuria, change in color of urine, no urgency or frequency.  No flank pain, no hematuria   MS:  No joint pain or swelling.  No decreased range of motion.  No back pain.  Psych:  No change in mood or affect. No depression or anxiety.  No memory loss.   Vital Signs BP 140/70 (BP Location: Right Arm, Patient Position: Sitting, Cuff  Size: Normal)   Pulse 96   Ht 5' 3.5" (1.613 m)   Wt 127 lb 9.6 oz (57.9 kg)   SpO2 95%   BMI 22.25 kg/m    Physical Exam:  General- No distress,  A&Ox3, pleasant ENT: No sinus tenderness, TM clear, pale nasal mucosa, no oral exudate,no post nasal drip, no LAN Cardiac: S1, S2, regular rate and rhythm, no murmur Chest: No wheeze/ rales/ dullness; no accessory muscle use, no nasal flaring, no sternal retractions Abd.: Soft Non-tender Ext: No clubbing cyanosis, 1+ foot edema R>L Neuro:  normal strength, alert and oriented 3, moving all extremities 4, appropriate Skin: No rashes, warm and dry Psych: normal mood and behavior   Assessment/Plan  Asthma with bronchitis Slow to resolve flare, finally resolving Plan CXR today. Mucinex DM Twice daily  As needed  Cough/congestion .  Tessalon Three times a day  As needed  Cough .  Symbicort 2 puffs Twice daily  , rinse after use .  Flutter valve As needed  For congestion .  Continue on Zantac .Twice daily   Please contact office for sooner follow up if symptoms do not improve or worsen or seek emergency care  Follow up as needed  Follow up appointment with Dr.  Annamaria Boots for Asthma Check up at his first available.     Magdalen Spatz, NP 07/09/2016  4:11 PM

## 2016-07-09 NOTE — Patient Instructions (Addendum)
It is nice to see you today. CXR today. Mucinex DM Twice daily  As needed  Cough/congestion .  Tessalon Three times a day  As needed  Cough .  Symbicort 2 puffs Twice daily  , rinse after use .  Flutter valve As needed  For congestion .  Continue on Zantac .Twice daily   Please contact office for sooner follow up if symptoms do not improve or worsen or seek emergency care  Follow up as needed  Follow up appointment with Dr. Annamaria Boots for Asthma Check up at his first available.

## 2016-07-09 NOTE — Assessment & Plan Note (Signed)
Slow to resolve flare, finally resolving Plan CXR today. Mucinex DM Twice daily  As needed  Cough/congestion .  Tessalon Three times a day  As needed  Cough .  Symbicort 2 puffs Twice daily  , rinse after use .  Flutter valve As needed  For congestion .  Continue on Zantac .Twice daily   Please contact office for sooner follow up if symptoms do not improve or worsen or seek emergency care  Follow up as needed  Follow up appointment with Dr. Annamaria Boots for Asthma Check up at his first available.

## 2016-07-09 NOTE — Progress Notes (Signed)
Spoke with patient informed her of results and to continue recommendations from office visit. Pt did not have any additional questions. Nothing further is needed.

## 2016-07-13 ENCOUNTER — Telehealth: Payer: Self-pay | Admitting: Physician Assistant

## 2016-07-13 ENCOUNTER — Encounter: Payer: Self-pay | Admitting: Cardiology

## 2016-07-13 ENCOUNTER — Ambulatory Visit (INDEPENDENT_AMBULATORY_CARE_PROVIDER_SITE_OTHER): Payer: Medicare Other | Admitting: Cardiology

## 2016-07-13 DIAGNOSIS — I10 Essential (primary) hypertension: Secondary | ICD-10-CM | POA: Diagnosis not present

## 2016-07-13 DIAGNOSIS — J45909 Unspecified asthma, uncomplicated: Secondary | ICD-10-CM | POA: Diagnosis not present

## 2016-07-13 DIAGNOSIS — R600 Localized edema: Secondary | ICD-10-CM | POA: Diagnosis not present

## 2016-07-13 DIAGNOSIS — J101 Influenza due to other identified influenza virus with other respiratory manifestations: Secondary | ICD-10-CM | POA: Diagnosis not present

## 2016-07-13 MED ORDER — HYDROCHLOROTHIAZIDE 12.5 MG PO CAPS: 12.5000 mg | ORAL_CAPSULE | Freq: Every day | ORAL | 5 refills | Status: DC

## 2016-07-13 NOTE — Assessment & Plan Note (Signed)
Pt's main complaint today is LE edema which has been present for the past few weeks.

## 2016-07-13 NOTE — Assessment & Plan Note (Signed)
Diagnosed 06/10/2016 hospitalized 06/11/16-06/15/16

## 2016-07-13 NOTE — Progress Notes (Signed)
07/13/2016 Sierra [name]   05/20/1928  [id number]  Primary Physician ARONSON,RICHARD A, MD Primary Cardiologist: Dr Gwenlyn Found  HPI:  Delightful 83 year old thin-appearing married Caucasian female who is accompanied by her husband who is also a pt of Dr Kennon Holter. She was referred by Dr Kennon Holter. She has a history of hypertension and hyperlipidemia both treated. She has reactive airways disease treated by Dr. Annamaria Boots and is on chronic steroids. She had a benign meningioma surgically removed with forced [facility] approximately 15 years ago. She saw Dr Gwenlyn Found for new DOE in Aug 2017. Myoview and echo were essentially normal. Earlier this year she was admitted with acute on chronic respiratory failure secondary to the flu and bronchitis. She was hospitalized 5 days. She still has a cough but feels she is gradually getting better.   She saw Suanne Marker barrett PA 06/26/16 as a post hospital follow up. The pt complained of some LE edema then but she was not felt to be volume overloaded and no medication changes were made. She is seeing me today because her LE edema has persisted.    Current Outpatient Prescriptions  Medication Sig Dispense Refill  . albuterol (PROVENTIL HFA;VENTOLIN HFA) 108 (90 Base) MCG/ACT inhaler Inhale 2 puffs into the lungs every 6 (six) hours as needed for wheezing or shortness of breath. 1 Inhaler 5  . albuterol (PROVENTIL) (2.5 MG/3ML) 0.083% nebulizer solution Take 3 mLs (2.5 mg total) by nebulization every 6 (six) hours as needed for wheezing or shortness of breath. 150 mL 5  . Artificial Tear Solution (BION TEARS) 0.1-0.3 % SOLN Place 1 drop into both eyes every morning.     . Artificial Tear Solution (GENTEAL TEARS) 0.1-0.2-0.3 % SOLN Place 1 drop into both eyes at bedtime.    . B Complex Vitamins (VITAMIN B COMPLEX PO) Take 1 tablet by mouth daily.    . benzonatate (TESSALON) 100 MG capsule Take 1 capsule (100 mg total) by mouth 3 (three) times daily as needed for cough.  30 capsule 0  . BIOTIN PO Take 1 tablet by mouth daily.    . budesonide-formoterol (SYMBICORT) 160-4.5 MCG/ACT inhaler Inhale 2 puffs into the lungs 2 (two) times daily. 1 Inhaler 5  . cholecalciferol (VITAMIN D) 1000 UNITS tablet Take 1,000 Units by mouth daily.    . Cyanocobalamin (VITAMIN B-12) 2500 MCG SUBL Take 2,500 mcg by mouth daily.     Marland Kitchen lactase (LACTAID) 3000 UNITS tablet Take 3,000 Units by mouth daily as needed (lactose intolerant).    Marland Kitchen levothyroxine (SYNTHROID, LEVOTHROID) 75 MCG tablet Take 75 mcg by mouth daily before breakfast.     . LORazepam (ATIVAN) 0.5 MG tablet Take 1 tablet by mouth at bedtime as needed.    . Multiple Vitamin (MULTIVITAMIN) tablet Take 1 tablet by mouth daily.    . NON FORMULARY Flutter device    . predniSONE (DELTASONE) 10 MG tablet Take 10 mg by mouth daily with breakfast.    . Probiotic Product (PROBIOTIC DAILY PO) Take 1 tablet by mouth daily.    . ranitidine (ZANTAC) 150 MG tablet Take 150 mg by mouth at bedtime.    . hydrochlorothiazide (MICROZIDE) 12.5 MG capsule Take 1 capsule (12.5 mg total) by mouth daily. 30 capsule 5   No current facility-administered medications for this visit.     Allergies  Allergen Reactions  . Biaxin [Clarithromycin] Other (See Comments)    Bitter taste  . Ciprofloxacin Rash  . Codeine Nausea And Vomiting  .  Dilantin [Phenytoin] Rash  . Doxycycline Nausea Only    Very nauseated  . Lactose Intolerance (Gi) Other (See Comments)    gas  . Restasis [Cyclosporine] Other (See Comments)    Eyes burn   . Sulfonamide Derivatives Rash    Past Medical History:  Diagnosis Date  . Acute asthmatic bronchitis   . Allergic rhinitis   . Arthritis    Hx R shoulder bursitis, pain L knee and L hip  . Asthmatic bronchitis   . Balance problem    SINCE BRAIN TUMOR REMOVED IN 2002-BENIGN TUMOR-PT HAS ADRENAL INSUFFICIENCY AND TAKES DAILY PREDNISONE  . Blood transfusion   . Cardiac murmur    DOES NOT CAUSE ANY SYMPTOMS    . Chronic kidney disease   . Chronic pharyngitis   . Diverticulitis, colon   . Dizziness   . DOE (dyspnea on exertion)   . Esophageal reflux   . GI bleed 01/06/2013  . Hyperlipemia   . Hypertension   . Hypothyroidism   . IBS (irritable bowel syndrome)   . Leukocytosis   . Other specified iron deficiency anemias   . Recurrent upper respiratory infection (URI) 07/10/2011    ACUTE BRONCHITIS - extra prednisone in addition to the daily prednisone and a Z-Pak  . Thrombocytosis ([place])   . Ulceration, colon   . Ulcerative colitis     Social History   Social History  . Marital status: Married    Spouse name: N/A  . Number of children: 2  . Years of education: N/A   Occupational History  . Retired Loss adjuster, chartered, [place] History Main Topics  . Smoking status: Never Smoker  . Smokeless tobacco: Never Used  . Alcohol use Yes     Comment: occ glass of wine  . Drug use: No  . Sexual activity: No   Other Topics Concern  . Not on file   Social History Narrative   Retd Education officer, museum.  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 Heart attack Father     deceased     Review of Systems: General: negative for chills, fever, night sweats or weight changes.  Cardiovascular: negative for chest pain, orthopnea, palpitations, paroxysmal nocturnal dyspnea  Dermatological: negative for rash Respiratory: chronic cough and occasional wheezing Urologic: negative for hematuria Abdominal: negative for nausea, vomiting, diarrhea, bright red blood per rectum, melena, or hematemesis Neurologic: negative for visual changes, syncope, or dizziness All other systems reviewed and are otherwise negative except as noted above.    Blood pressure (!) 144/82, pulse 96, height 5\' 3"  (1.6 m), weight 132 lb 9.6 oz (60.1 kg).  General appearance: alert, cooperative and no distress Neck: no  carotid bruit and no JVD Lungs: overall clear lung feilds, she does have a hoarse cough Heart: regular rate and rhythm Extremities: 1+ bilateral pedal edema, LE varicosites noted.  Skin: Skin color, texture, turgor normal. No rashes or lesions Neurologic: Grossly normal   ASSESSMENT AND PLAN:   Edema extremities Pt's main complaint today is LE edema which has been present for the past few weeks.  Essential hypertension Will stop low dose Amlodipine and add HCTZ 12.5 mg daily  Influenza A Diagnosed 06/10/2016 hospitalized 06/11/16-06/15/16  Asthma with bronchitis Followed by Dr Annamaria Boots, she is on chronic steroids for asthma and adrenal insufficincy   PLAN  I  suggested to her that her LE edema ,may be secondary to Amlodipine and venous insufficiency. She does not appear to be in CHF on exam. I asked her to stop her Amlodipine and start HCTZ 12.5 mg. She'll return in two weeks for a BMP and B/P check.   Kerin Ransom PA-C 07/13/2016 12:12 PM

## 2016-07-13 NOTE — Assessment & Plan Note (Signed)
Will stop low dose Amlodipine and add HCTZ 12.5 mg daily

## 2016-07-13 NOTE — Assessment & Plan Note (Signed)
Followed by Dr Annamaria Boots, she is on chronic steroids for asthma and adrenal insufficincy

## 2016-07-13 NOTE — Telephone Encounter (Signed)
Pt says she wants to be seen today by somebody,her feet and ankles are swelling real bad. Pt said Sierra [name] told her if this happen to be seen.

## 2016-07-13 NOTE — Patient Instructions (Signed)
Medication Instructions:  STOP AMLODIPINE   START HYDROCHLOROTHIAZIDE 12.5 MG DAILY    Labwork: NONE  Testing/Procedures: NONE  Follow-Up: Your physician recommends that you schedule a follow-up appointment in: 1-2 WEEKS WITH LUKE K PA ON A DAY DR BERRY IS IN THE OFFICE   If you need a refill on your cardiac medications before your next appointment, please call your pharmacy.

## 2016-07-13 NOTE — Telephone Encounter (Signed)
Spoke with pt appt made with luke today at 1130am

## 2016-07-21 ENCOUNTER — Encounter (HOSPITAL_BASED_OUTPATIENT_CLINIC_OR_DEPARTMENT_OTHER): Payer: Self-pay | Admitting: *Deleted

## 2016-07-21 ENCOUNTER — Emergency Department (HOSPITAL_BASED_OUTPATIENT_CLINIC_OR_DEPARTMENT_OTHER)
Admission: EM | Admit: 2016-07-21 | Discharge: 2016-07-21 | Disposition: A | Discharge status: Home / Self Care | Payer: Medicare Other | Attending: Dermatology | Admitting: Dermatology

## 2016-07-21 DIAGNOSIS — N189 Chronic kidney disease, unspecified: Secondary | ICD-10-CM | POA: Insufficient documentation

## 2016-07-21 DIAGNOSIS — E039 Hypothyroidism, unspecified: Secondary | ICD-10-CM | POA: Diagnosis not present

## 2016-07-21 DIAGNOSIS — W228XXA Striking against or struck by other objects, initial encounter: Secondary | ICD-10-CM | POA: Insufficient documentation

## 2016-07-21 DIAGNOSIS — Z5321 Procedure and treatment not carried out due to patient leaving prior to being seen by health care provider: Secondary | ICD-10-CM | POA: Insufficient documentation

## 2016-07-21 DIAGNOSIS — Y929 Unspecified place or not applicable: Secondary | ICD-10-CM | POA: Insufficient documentation

## 2016-07-21 DIAGNOSIS — Y999 Unspecified external cause status: Secondary | ICD-10-CM | POA: Diagnosis not present

## 2016-07-21 DIAGNOSIS — Y939 Activity, unspecified: Secondary | ICD-10-CM | POA: Diagnosis not present

## 2016-07-21 DIAGNOSIS — I129 Hypertensive chronic kidney disease with stage 1 through stage 4 chronic kidney disease, or unspecified chronic kidney disease: Secondary | ICD-10-CM | POA: Diagnosis not present

## 2016-07-21 DIAGNOSIS — J45909 Unspecified asthma, uncomplicated: Secondary | ICD-10-CM | POA: Diagnosis not present

## 2016-07-21 DIAGNOSIS — S0990XA Unspecified injury of head, initial encounter: Secondary | ICD-10-CM | POA: Insufficient documentation

## 2016-07-21 NOTE — ED Triage Notes (Signed)
Cough and congestion x 2 months. Flu 2 months ago but cough never went away. She passed out last night and hit her head on the floor. Laceration to her left forearm. She lives at [facility].

## 2016-07-21 NOTE — ED Notes (Signed)
Husband states his dog is in the car and he needs to leave.  Encouraged to stay but he states he will come back

## 2016-07-22 ENCOUNTER — Emergency Department (HOSPITAL_COMMUNITY): Payer: Medicare Other

## 2016-07-22 ENCOUNTER — Encounter (HOSPITAL_COMMUNITY): Payer: Self-pay

## 2016-07-22 ENCOUNTER — Emergency Department (HOSPITAL_COMMUNITY)
Admission: EM | Admit: 2016-07-22 | Discharge: 2016-07-22 | Disposition: A | Discharge status: Home / Self Care | Payer: Medicare Other | Attending: Emergency Medicine | Admitting: Emergency Medicine

## 2016-07-22 DIAGNOSIS — S51812A Laceration without foreign body of left forearm, initial encounter: Secondary | ICD-10-CM | POA: Insufficient documentation

## 2016-07-22 DIAGNOSIS — W1839XA Other fall on same level, initial encounter: Secondary | ICD-10-CM | POA: Insufficient documentation

## 2016-07-22 DIAGNOSIS — S065XAA Traumatic subdural hemorrhage with loss of consciousness status unknown, initial encounter: Secondary | ICD-10-CM

## 2016-07-22 DIAGNOSIS — W19XXXA Unspecified fall, initial encounter: Secondary | ICD-10-CM

## 2016-07-22 DIAGNOSIS — Y929 Unspecified place or not applicable: Secondary | ICD-10-CM | POA: Diagnosis not present

## 2016-07-22 DIAGNOSIS — I129 Hypertensive chronic kidney disease with stage 1 through stage 4 chronic kidney disease, or unspecified chronic kidney disease: Secondary | ICD-10-CM | POA: Insufficient documentation

## 2016-07-22 DIAGNOSIS — S065X0A Traumatic subdural hemorrhage without loss of consciousness, initial encounter: Secondary | ICD-10-CM | POA: Insufficient documentation

## 2016-07-22 DIAGNOSIS — Z79899 Other long term (current) drug therapy: Secondary | ICD-10-CM | POA: Insufficient documentation

## 2016-07-22 DIAGNOSIS — J45909 Unspecified asthma, uncomplicated: Secondary | ICD-10-CM | POA: Insufficient documentation

## 2016-07-22 DIAGNOSIS — N182 Chronic kidney disease, stage 2 (mild): Secondary | ICD-10-CM | POA: Insufficient documentation

## 2016-07-22 DIAGNOSIS — E039 Hypothyroidism, unspecified: Secondary | ICD-10-CM | POA: Insufficient documentation

## 2016-07-22 DIAGNOSIS — Y999 Unspecified external cause status: Secondary | ICD-10-CM | POA: Insufficient documentation

## 2016-07-22 DIAGNOSIS — Y939 Activity, unspecified: Secondary | ICD-10-CM | POA: Insufficient documentation

## 2016-07-22 DIAGNOSIS — Z96643 Presence of artificial hip joint, bilateral: Secondary | ICD-10-CM | POA: Insufficient documentation

## 2016-07-22 DIAGNOSIS — S0990XA Unspecified injury of head, initial encounter: Secondary | ICD-10-CM | POA: Diagnosis present

## 2016-07-22 DIAGNOSIS — S065X9A Traumatic subdural hemorrhage with loss of consciousness of unspecified duration, initial encounter: Secondary | ICD-10-CM

## 2016-07-22 LAB — BASIC METABOLIC PANEL
Anion gap: 9 (ref 5–15)
BUN: 21 mg/dL — ABNORMAL HIGH (ref 6–20)
CALCIUM: 10.2 mg/dL (ref 8.9–10.3)
CO2: 30 mmol/L (ref 22–32)
CREATININE: 1.21 mg/dL — AB (ref 0.44–1.00)
Chloride: 92 mmol/L — ABNORMAL LOW (ref 101–111)
GFR calc Af Amer: 46 mL/min — ABNORMAL LOW (ref >60)
GFR calc non Af Amer: 40 mL/min — ABNORMAL LOW (ref >60)
GLUCOSE: 190 mg/dL — AB (ref 65–99)
Potassium: 4.6 mmol/L (ref 3.5–5.1)
Sodium: 131 mmol/L — ABNORMAL LOW (ref 135–145)

## 2016-07-22 LAB — CBG MONITORING, ED: Glucose-Capillary: 127 mg/dL — ABNORMAL HIGH (ref 65–99)

## 2016-07-22 LAB — URINALYSIS, ROUTINE W REFLEX MICROSCOPIC
Bilirubin Urine: NEGATIVE
GLUCOSE, UA: 50 mg/dL — AB
HGB URINE DIPSTICK: NEGATIVE
Ketones, ur: NEGATIVE mg/dL
Leukocytes, UA: NEGATIVE
Nitrite: NEGATIVE
PROTEIN: NEGATIVE mg/dL
SPECIFIC GRAVITY, URINE: 1.01 (ref 1.005–1.030)
pH: 5 (ref 5.0–8.0)

## 2016-07-22 LAB — CBC
HCT: 38.5 % (ref 36.0–46.0)
HEMOGLOBIN: 12.6 g/dL (ref 12.0–15.0)
MCH: 28.3 pg (ref 26.0–34.0)
MCHC: 32.7 g/dL (ref 30.0–36.0)
MCV: 86.5 fL (ref 78.0–100.0)
PLATELETS: 290 10*3/uL (ref 150–400)
RBC: 4.45 MIL/uL (ref 3.87–5.11)
RDW: 16.5 % — ABNORMAL HIGH (ref 11.5–15.5)
WBC: 15.4 10*3/uL — ABNORMAL HIGH (ref 4.0–10.5)

## 2016-07-22 NOTE — ED Provider Notes (Signed)
[facility] DEPT Provider Note   CSN: [id number] Arrival date & time: 07/22/16  1418     History   Chief Complaint Chief Complaint  Patient presents with  . Fall  . Weakness    HPI Sierra [name] is a 83 y.o. female.  83 yo F with a chief complaint of a fall. This actually happened a couple days ago. Patient is unsure what happened. For the morning she found herself on the floor. Was complaining of some left-sided headache. She did not seek medical care at that time. She was seen yesterday and was noted to have a skin tear to her left forearm. She had been repeating phrases as well as had a change in her gait where they feel that she is shuffling.   The history is provided by the patient.  Fall  This is a new problem. The current episode started less than 1 hour ago. The problem occurs constantly. The problem has not changed since onset.Pertinent negatives include no chest pain, no headaches and no shortness of breath. Nothing aggravates the symptoms. Nothing relieves the symptoms. She has tried nothing for the symptoms. The treatment provided no relief.  Weakness  Primary symptoms include no dizziness. Pertinent negatives include no shortness of breath, no chest pain, no vomiting and no headaches.    Past Medical History:  Diagnosis Date  . Acute asthmatic bronchitis   . Allergic rhinitis   . Arthritis    Hx R shoulder bursitis, pain L knee and L hip  . Asthmatic bronchitis   . Balance problem    SINCE BRAIN TUMOR REMOVED IN 2002-BENIGN TUMOR-PT HAS ADRENAL INSUFFICIENCY AND TAKES DAILY PREDNISONE  . Blood transfusion   . Cardiac murmur    DOES NOT CAUSE ANY SYMPTOMS  . Chronic kidney disease   . Chronic pharyngitis   . Diverticulitis, colon   . Dizziness   . DOE (dyspnea on exertion)   . Esophageal reflux   . GI bleed 01/06/2013  . Hyperlipemia   . Hypertension   . Hypothyroidism   . IBS (irritable bowel syndrome)   . Leukocytosis   . Other specified iron  deficiency anemias   . Recurrent upper respiratory infection (URI) 07/10/2011    ACUTE BRONCHITIS - extra prednisone in addition to the daily prednisone and a Z-Pak  . Thrombocytosis ([place])   . Ulceration, colon   . Ulcerative colitis     Patient Active Problem List   Diagnosis Date Noted  . Edema extremities 07/13/2016  . Influenza due to identified novel influenza A virus with other respiratory manifestations 06/11/2016  . Influenza A 06/11/2016  . Acute respiratory failure with hypoxia ([place]) 06/11/2016  . Influenza A with respiratory manifestations 06/11/2016  . Anemia 12/12/2014  . Anxiety 12/12/2014  . Asthma 12/12/2014  . Chronic kidney disease, stage II (mild) 12/12/2014  . H/O: GI bleed 12/12/2014  . Postural lightheadedness 12/12/2014  . Rectal bleeding 01/03/2014  . Diverticulosis 01/03/2014  . Adrenal insufficiency ([place]) 01/03/2014  . Ulcerative colitis, chronic ([place]) 01/03/2014  . Asthma with acute exacerbation 06/23/2013  . Acute blood loss anemia 01/06/2013  . LGI bleed 01/05/2013  . Malaise and fatigue 09/21/2011  . Benign hypertensive heart disease without heart failure 09/21/2011  . Atrial premature contractions 09/21/2011  . Postop Hyponatremia 07/28/2011  . Osteoarthritis of hip 07/27/2011  . PNEUMONIA 06/02/2010  . DYSPNEA ON EXERTION 05/30/2010  . ATYPICAL MYCOBACTERIAL INFECTION 05/05/2010  . HYPOTHYROIDISM 05/01/2010  . Ulcerative colitis ([place]) 03/02/2010  .  HOARSENESS, CHRONIC 11/05/2009  . THRUSH 04/30/2009  . Asthma with bronchitis 04/30/2009  . HYPERLIPIDEMIA 07/11/2007  . ANEMIA, IRON DEFICIENCY, MICROCYTIC 07/11/2007  . LEUKOCYTOSIS 07/11/2007  . THROMBOCYTOSIS 07/11/2007  . Essential hypertension 07/11/2007  . Acute bronchitis 07/11/2007  . PHARYNGITIS, CHRONIC 07/11/2007  . Nonallergic vasomotor rhinitis 07/11/2007  . Esophageal reflux 07/11/2007  . DIVERTICULITIS, COLON 07/11/2007  . ARTHRITIS 07/11/2007  . CARDIAC MURMUR 07/11/2007    . Diverticulitis of colon 07/11/2007    Past Surgical History:  Procedure Laterality Date  . ABDOMINAL HYSTERECTOMY  1985  . APPENDECTOMY    . COLONOSCOPY N/A 01/05/2013   Procedure: COLONOSCOPY;  Surgeon: Cleotis Nipper, MD;  Location: [facility] ENDOSCOPY;  Service: Endoscopy;  Laterality: N/A;  . COLONOSCOPY N/A 01/04/2014   Procedure: COLONOSCOPY;  Surgeon: Winfield Cunas., MD;  Location: [facility] ENDOSCOPY;  Service: Endoscopy;  Laterality: N/A;  . CRANIOTOMY    . DILATION AND CURETTAGE OF UTERUS  1967  . ESOPHAGOGASTRODUODENOSCOPY N/A 01/05/2013   Procedure: ESOPHAGOGASTRODUODENOSCOPY (EGD);  Surgeon: Cleotis Nipper, MD;  Location[facility] ENDOSCOPY;  Service: Endoscopy;  Laterality: N/A;  . ESOPHAGOGASTRODUODENOSCOPY N/A 06/26/2014   Procedure: ESOPHAGOGASTRODUODENOSCOPY (EGD);  Surgeon: Winfield Cunas., MD;  Location: [facility] ENDOSCOPY;  Service: Endoscopy;  Laterality: N/A;  . EYE SURGERY     2003 -RIGHT CATARACT EXTRACTED AND LEFT WAS DONE IN 2004  . FLEXIBLE SIGMOIDOSCOPY N/A 06/26/2014   Procedure: FLEXIBLE SIGMOIDOSCOPY;  Surgeon: Winfield Cunas., MD;  Location: [facility] ENDOSCOPY;  Service: Endoscopy;  Laterality: N/A;  . FRONTALIS SUSPENSION  10-09-2010   lifting eyelids AND SECOND EYE SURGERY November 19, 2010  . meningioma resected  20O2  . PARTIAL COLECTOMY  2008  . subglottal mucocoel  2000  . THYROIDECTOMY  1986  . TONSILLECTOMY  1938  . TOTAL HIP ARTHROPLASTY  OCT 2006   right  . TOTAL HIP ARTHROPLASTY  07/27/2011   Procedure: TOTAL HIP ARTHROPLASTY;  Surgeon: Gearlean Alf, MD;  Location: [facility] ORS;  Service: Orthopedics;  Laterality: Left;  Marland Kitchen VESICOVAGINAL FISTULA CLOSURE W/ TAH    . WRIST TENDON LESION REMOVED 2007      OB History    No data available       Home Medications    Prior to Admission medications   Medication Sig Start Date End Date Taking? Authorizing Provider  albuterol (PROVENTIL HFA;VENTOLIN HFA) 108 (90 Base) MCG/ACT inhaler Inhale 2 puffs into the lungs  every 6 (six) hours as needed for wheezing or shortness of breath. 06/12/16 10/02/20 Yes Clinton D Young, MD  amLODipine (NORVASC) 2.5 MG tablet Take 2.5 mg by mouth daily.   Yes Historical Provider, MD  Artificial Tear Solution (BION TEARS) 0.1-0.3 % SOLN Place 1 drop into both eyes every morning.    Yes Historical Provider, MD  Artificial Tear Solution (GENTEAL TEARS) 0.1-0.2-0.3 % SOLN Place 1 drop into both eyes at bedtime.   Yes Historical Provider, MD  B Complex Vitamins (VITAMIN B COMPLEX PO) Take 1 tablet by mouth daily.   Yes Historical Provider, MD  benzonatate (TESSALON) 100 MG capsule Take 1 capsule (100 mg total) by mouth 3 (three) times daily as needed for cough. 07/09/16  Yes Magdalen Spatz, NP  BIOTIN PO Take 1 tablet by mouth daily.   Yes Historical Provider, MD  budesonide-formoterol (SYMBICORT) 160-4.5 MCG/ACT inhaler Inhale 2 puffs into the lungs 2 (two) times daily. 06/30/16  Yes Tammy S Parrett, NP  cholecalciferol (VITAMIN D) 1000 UNITS tablet  Take 1,000 Units by mouth daily.   Yes Historical Provider, MD  Cyanocobalamin (VITAMIN B-12) 2500 MCG SUBL Take 2,500 mcg by mouth daily.    Yes Historical Provider, MD  hydrochlorothiazide (MICROZIDE) 12.5 MG capsule Take 1 capsule (12.5 mg total) by mouth daily. 07/13/16 10/11/16 Yes Luke K Kilroy, PA-C  lactase (LACTAID) 3000 UNITS tablet Take 3,000 Units by mouth daily as needed (lactose intolerant).   Yes Historical Provider, MD  levothyroxine (SYNTHROID, LEVOTHROID) 75 MCG tablet Take 75 mcg by mouth daily before breakfast.    Yes Historical Provider, MD  LORazepam (ATIVAN) 0.5 MG tablet Take 1 tablet by mouth at bedtime as needed. 06/16/16  Yes Historical Provider, MD  Multiple Vitamin (MULTIVITAMIN) tablet Take 1 tablet by mouth daily.   Yes Historical Provider, MD  predniSONE (DELTASONE) 10 MG tablet Take 10 mg by mouth daily with breakfast.   Yes Historical Provider, MD  Probiotic Product (PROBIOTIC DAILY PO) Take 1 tablet by mouth daily.    Yes Historical Provider, MD  ranitidine (ZANTAC) 150 MG tablet Take 150 mg by mouth at bedtime.   Yes Historical Provider, MD  albuterol (PROVENTIL) (2.5 MG/3ML) 0.083% nebulizer solution Take 3 mLs (2.5 mg total) by nebulization every 6 (six) hours as needed for wheezing or shortness of breath. Patient not taking: Reported on 07/22/2016 06/24/16   Deneise Lever, MD  NON FORMULARY Flutter device    Historical Provider, MD    Family History Family History  Problem Relation Age of Onset  . Heart attack Father     deceased    Social History Social History  Substance Use Topics  . Smoking status: Never Smoker  . Smokeless tobacco: Never Used  . Alcohol use Yes     Comment: occ glass of wine     Allergies   Biaxin [clarithromycin]; Ciprofloxacin; Codeine; Dilantin [phenytoin]; Doxycycline; Lactose intolerance (gi); Restasis [cyclosporine]; and Sulfonamide derivatives   Review of Systems Review of Systems  Constitutional: Negative for chills and fever.  HENT: Negative for congestion and rhinorrhea.   Eyes: Negative for redness and visual disturbance.  Respiratory: Negative for shortness of breath and wheezing.   Cardiovascular: Negative for chest pain and palpitations.  Gastrointestinal: Negative for nausea and vomiting.  Genitourinary: Negative for dysuria and urgency.  Musculoskeletal: Negative for arthralgias and myalgias.  Skin: Negative for pallor and wound.  Neurological: Positive for weakness. Negative for dizziness and headaches.     Physical Exam Updated Vital Signs BP 130/76 (BP Location: Left Arm)   Pulse 90   Temp 97.5 F (36.4 C) (Oral)   Resp 16   SpO2 95%   Physical Exam  Constitutional: She is oriented to person, place, and time. She appears well-developed and well-nourished. No distress.  HENT:  Head: Normocephalic and atraumatic.  Eyes: EOM are normal. Pupils are equal, round, and reactive to light.  Neck: Normal range of motion. Neck supple.    Cardiovascular: Normal rate and regular rhythm.  Exam reveals no gallop and no friction rub.   No murmur heard. Pulmonary/Chest: Effort normal. She has no wheezes. She has no rales.  Abdominal: Soft. She exhibits no distension and no mass. There is no tenderness. There is no guarding.  Musculoskeletal: She exhibits no edema or tenderness.  Neurological: She is alert and oriented to person, place, and time.  Slightly shuffling gait  Skin: Skin is warm and dry. She is not diaphoretic.  Psychiatric: She has a normal mood and affect. Her behavior is normal.  Nursing note and vitals reviewed.    ED Treatments / Results  Labs (all labs ordered are listed, but only abnormal results are displayed) Labs Reviewed  BASIC METABOLIC PANEL - Abnormal; Notable for the following:       Result Value   Sodium 131 (*)    Chloride 92 (*)    Glucose, Bld 190 (*)    BUN 21 (*)    Creatinine, Ser 1.21 (*)    GFR calc non Af Amer 40 (*)    GFR calc Af Amer 46 (*)    All other components within normal limits  CBC - Abnormal; Notable for the following:    WBC 15.4 (*)    RDW 16.5 (*)    All other components within normal limits  URINALYSIS, ROUTINE W REFLEX MICROSCOPIC - Abnormal; Notable for the following:    APPearance HAZY (*)    Glucose, UA 50 (*)    All other components within normal limits  CBG MONITORING, ED - Abnormal; Notable for the following:    Glucose-Capillary 127 (*)    All other components within normal limits    EKG  EKG Interpretation  Date/Time:  Wednesday July 22 2016 14:45:48 EDT Ventricular Rate:  97 PR Interval:    QRS Duration: 100 QT Interval:  345 QTC Calculation: 439 R Axis:   118 Text Interpretation:  Sinus rhythm Probable inferior infarct, old Baseline wander in lead(s) I III aVL aVF Otherwise no significant change Confirmed by Tyrone Nine MD, DANIEL [phone]) on 07/22/2016 5:36:19 PM       Radiology Dg Chest 2 View  Result Date: 07/22/2016 CLINICAL DATA:   Recent fall. EXAM: CHEST  2 VIEW COMPARISON:  07/09/2016 chest radiograph. FINDINGS: Stable cardiomediastinal silhouette with top-normal heart size and aortic atherosclerosis Aortic atherosclerosis. No pneumothorax. No pleural effusion. Lungs appear clear, with no acute consolidative airspace disease and no pulmonary edema. IMPRESSION: No active cardiopulmonary disease. Electronically Signed   By: Ilona Sorrel M.D.   On: 07/22/2016 17:08   Ct Head Wo Contrast  Result Date: 07/22/2016 CLINICAL DATA:  [place] 2 days ago when getting up to the bathroom, no loss of consciousness, unsteady gait since, history of benign brain tumor post surgery 16 years ago, hypertension, ulcer colitis, asthma EXAM: CT HEAD WITHOUT CONTRAST TECHNIQUE: Contiguous axial images were obtained from the base of the skull through the vertex without intravenous contrast. Sagittal and coronal MPR images reconstructed from axial data set. COMPARISON:  10/26/2015 FINDINGS: Brain: Generalized atrophy. Stable ventricular morphology. No midline shift or mass effect. Small vessel chronic ischemic changes of deep cerebral white matter. Chronic LEFT occipital lobe encephalomalacia suspect related to prior surgery. Tiny subdural hematoma identified along the LEFT lateral aspect of the falx cranially, 3 mm thick, without significant mass effect. No additional intracranial hemorrhage, mass lesion or evidence of acute infarction. Posterior fossa unremarkable. Vascular: Atherosclerotic calcifications at the carotid siphons bilaterally Skull: Prior posterior LEFT parietal craniotomy bones demineralized. No definite fractures. Sinuses/Orbits: Tiny amount of fluid dependently in the sphenoid sinus. Remaining visualized paranasal sinuses and mastoid air cells clear. Intraorbital soft tissue planes clear. Other: N/A IMPRESSION: Atrophy with small vessel chronic ischemic changes of deep cerebral white matter. Postsurgical changes with encephalomalacia at LEFT  occipital lobe. Acute 3 mm thick LEFT falcine subdural hematoma without midline shift. Critical Value/emergent results were called by telephone at the time of interpretation on 07/22/2016 at 5:20 pm to Dr. Deno Etienne , who verbally acknowledged these results. Electronically Signed  By: Lavonia Dana M.D.   On: 07/22/2016 17:22    Procedures Procedures (including critical care time)  Medications Ordered in ED Medications - No data to display   Initial Impression / Assessment and Plan / ED Course  I have reviewed the triage vital signs and the nursing notes.  Pertinent labs & imaging results that were available during my care of the patient were reviewed by me and considered in my medical decision making (see chart for details).     83 yo F With a chief complaints of a fall. She is unsure of why she. woke up on the floor. Having a mild headache but denies other symptoms. Did say that she was having some mild change to her gait which is chronically affected due to brain tumor that was removed.  CT head with 16mm SDH along the falx. Discussed with Dr. Christella Noa, neurosurgery.  [employer] with d/c home.   12:54 AM:  I have discussed the diagnosis/risks/treatment options with the patient and family and believe the pt to be eligible for discharge home to follow-up with PCP. We also discussed returning to the ED immediately if new or worsening sx occur. We discussed the sx which are most concerning (e.g., sudden worsening pain, fever, inability to tolerate by mouth) that necessitate immediate return. Medications administered to the patient during their visit and any new prescriptions provided to the patient are listed below.  Medications given during this visit Medications - No data to display   The patient appears reasonably screen and/or stabilized for discharge and I doubt any other medical condition or other [facility] requiring further screening, evaluation, or treatment in the ED at this time prior to discharge.     Final Clinical Impressions(s) / ED Diagnoses   Final diagnoses:  SDH (subdural hematoma) ([place])  Fall, initial encounter    New Prescriptions Discharge Medication List as of 07/22/2016  6:34 PM       Deno Etienne, DO 07/23/16 [id number]

## 2016-07-22 NOTE — ED Triage Notes (Signed)
[facility].  Pt fell 2 days ago.  Pt states she does not know why she fell.  She was getting up to the bathroom.  Pt does state she hit her head when she fell. She denies any pain from the fall.  [facility] but did not finish waiting.  Went to med center yesterday and EKG was done but again pt did not stay.  Pt called MD today and told to come to ER for evaluation.  Pt is alert and oriented.  No neuro deficit noted.  No thinners.

## 2016-07-22 NOTE — Discharge Instructions (Signed)
Return for worsening symptoms.    

## 2016-07-23 ENCOUNTER — Emergency Department (HOSPITAL_COMMUNITY): Payer: Medicare Other

## 2016-07-23 ENCOUNTER — Encounter (HOSPITAL_COMMUNITY): Payer: Self-pay | Admitting: Emergency Medicine

## 2016-07-23 ENCOUNTER — Emergency Department (HOSPITAL_COMMUNITY)
Admission: EM | Admit: 2016-07-23 | Discharge: 2016-07-23 | Disposition: A | Discharge status: Home / Self Care | Payer: Medicare Other | Attending: Emergency Medicine | Admitting: Emergency Medicine

## 2016-07-23 ENCOUNTER — Telehealth: Payer: Self-pay | Admitting: Cardiovascular Disease

## 2016-07-23 DIAGNOSIS — Z23 Encounter for immunization: Secondary | ICD-10-CM | POA: Insufficient documentation

## 2016-07-23 DIAGNOSIS — E039 Hypothyroidism, unspecified: Secondary | ICD-10-CM | POA: Insufficient documentation

## 2016-07-23 DIAGNOSIS — S0282XA Fracture of other specified skull and facial bones, left side, initial encounter for closed fracture: Secondary | ICD-10-CM | POA: Insufficient documentation

## 2016-07-23 DIAGNOSIS — I129 Hypertensive chronic kidney disease with stage 1 through stage 4 chronic kidney disease, or unspecified chronic kidney disease: Secondary | ICD-10-CM | POA: Insufficient documentation

## 2016-07-23 DIAGNOSIS — W01198A Fall on same level from slipping, tripping and stumbling with subsequent striking against other object, initial encounter: Secondary | ICD-10-CM | POA: Insufficient documentation

## 2016-07-23 DIAGNOSIS — Y92481 Parking lot as the place of occurrence of the external cause: Secondary | ICD-10-CM | POA: Diagnosis not present

## 2016-07-23 DIAGNOSIS — S0990XA Unspecified injury of head, initial encounter: Secondary | ICD-10-CM | POA: Diagnosis present

## 2016-07-23 DIAGNOSIS — N182 Chronic kidney disease, stage 2 (mild): Secondary | ICD-10-CM | POA: Insufficient documentation

## 2016-07-23 DIAGNOSIS — S0281XA Fracture of other specified skull and facial bones, right side, initial encounter for closed fracture: Secondary | ICD-10-CM | POA: Diagnosis not present

## 2016-07-23 DIAGNOSIS — Y999 Unspecified external cause status: Secondary | ICD-10-CM | POA: Insufficient documentation

## 2016-07-23 DIAGNOSIS — Y939 Activity, unspecified: Secondary | ICD-10-CM | POA: Insufficient documentation

## 2016-07-23 DIAGNOSIS — S161XXA Strain of muscle, fascia and tendon at neck level, initial encounter: Secondary | ICD-10-CM | POA: Diagnosis not present

## 2016-07-23 DIAGNOSIS — W19XXXA Unspecified fall, initial encounter: Secondary | ICD-10-CM

## 2016-07-23 DIAGNOSIS — Z96641 Presence of right artificial hip joint: Secondary | ICD-10-CM | POA: Diagnosis not present

## 2016-07-23 DIAGNOSIS — S0292XA Unspecified fracture of facial bones, initial encounter for closed fracture: Secondary | ICD-10-CM

## 2016-07-23 LAB — I-STAT CHEM 8, ED
BUN: 26 mg/dL — AB (ref 6–20)
CALCIUM ION: 1.16 mmol/L (ref 1.15–1.40)
CREATININE: 1.1 mg/dL — AB (ref 0.44–1.00)
Chloride: 94 mmol/L — ABNORMAL LOW (ref 101–111)
Glucose, Bld: 194 mg/dL — ABNORMAL HIGH (ref 65–99)
HCT: 35 % — ABNORMAL LOW (ref 36.0–46.0)
Hemoglobin: 11.9 g/dL — ABNORMAL LOW (ref 12.0–15.0)
Potassium: 3.8 mmol/L (ref 3.5–5.1)
SODIUM: 133 mmol/L — AB (ref 135–145)
TCO2: 30 mmol/L (ref 0–100)

## 2016-07-23 MED ORDER — ACETAMINOPHEN 500 MG PO TABS
1000.0000 mg | ORAL_TABLET | Freq: Once | ORAL | Status: AC
  Administered 2016-07-23: 1000 mg via ORAL
  Filled 2016-07-23: qty 2

## 2016-07-23 MED ORDER — TETANUS-DIPHTH-ACELL PERTUSSIS 5-2.5-18.5 LF-MCG/0.5 IM SUSP: 0.5000 mL | Freq: Once | INTRAMUSCULAR | Status: DC

## 2016-07-23 NOTE — Discharge Instructions (Signed)
You were seen in the ED today after a trip and fall. You were found to have multiple face fractures and will need to see the ENT doctors tomorrow at 8:30 AM. Call the office tomorrow [phone] and go in at 8:30 AM for an appointment with Dr. Marla Roe. You may require surgery at some point but they will discuss this with you further.   In the mean time, DO NOT CHEW ANY FOOD until you are seen by the ENT doctor. You may drink liquids by mouth.   Return to the ED with any new falls, fever, chills, or worsening bleeding.

## 2016-07-23 NOTE — ED Notes (Signed)
After being cleaned pt appears to have some mild bleeding internally which pt uses suction to control.

## 2016-07-23 NOTE — ED Notes (Signed)
Patient transported to X-ray 

## 2016-07-23 NOTE — ED Notes (Signed)
ED Provider at bedside. 

## 2016-07-23 NOTE — ED Provider Notes (Signed)
Emergency Department Provider Note   I have reviewed the triage vital signs and the nursing notes.   HISTORY  Chief Complaint Facial Injury and Epistaxis   HPI Sierra [name] is a 83 y.o. female with PMH of CKD, HLD, HTN, and IBS presents to the emergency pertinent for evaluation of mechanical fall. Patient tripped and fell in the parking lot. She had her face on the curb expressing severe pain in her nose and face. She had brisk bleeding on scene and EMS was called. She also reports some abrasion to the right hand and bilateral knees. No loss of consciousness. She denies any sudden lightheadedness, weakness, chest pain, palpitations, difficulty breathing prior to falling. She was seen in the emergency department yesterday for syncope but insists this was a trip and fall. Her main complaint this time is pain in the nose and face area with some trickling blood that seems to be improving somewhat.   Past Medical History:  Diagnosis Date  . Acute asthmatic bronchitis   . Allergic rhinitis   . Arthritis    Hx R shoulder bursitis, pain L knee and L hip  . Asthmatic bronchitis   . Balance problem    SINCE BRAIN TUMOR REMOVED IN 2002-BENIGN TUMOR-PT HAS ADRENAL INSUFFICIENCY AND TAKES DAILY PREDNISONE  . Blood transfusion   . Cardiac murmur    DOES NOT CAUSE ANY SYMPTOMS  . Chronic kidney disease   . Chronic pharyngitis   . Diverticulitis, colon   . Dizziness   . DOE (dyspnea on exertion)   . Esophageal reflux   . GI bleed 01/06/2013  . Hyperlipemia   . Hypertension   . Hypothyroidism   . IBS (irritable bowel syndrome)   . Leukocytosis   . Other specified iron deficiency anemias   . Recurrent upper respiratory infection (URI) 07/10/2011    ACUTE BRONCHITIS - extra prednisone in addition to the daily prednisone and a Z-Pak  . Thrombocytosis ([place])   . Ulceration, colon   . Ulcerative colitis     Patient Active Problem List   Diagnosis Date Noted  . Edema extremities  07/13/2016  . Influenza due to identified novel influenza A virus with other respiratory manifestations 06/11/2016  . Influenza A 06/11/2016  . Acute respiratory failure with hypoxia ([place]) 06/11/2016  . Influenza A with respiratory manifestations 06/11/2016  . Anemia 12/12/2014  . Anxiety 12/12/2014  . Asthma 12/12/2014  . Chronic kidney disease, stage II (mild) 12/12/2014  . H/O: GI bleed 12/12/2014  . Postural lightheadedness 12/12/2014  . Rectal bleeding 01/03/2014  . Diverticulosis 01/03/2014  . Adrenal insufficiency ([place]) 01/03/2014  . Ulcerative colitis, chronic ([place]) 01/03/2014  . Asthma with acute exacerbation 06/23/2013  . Acute blood loss anemia 01/06/2013  . LGI bleed 01/05/2013  . Malaise and fatigue 09/21/2011  . Benign hypertensive heart disease without heart failure 09/21/2011  . Atrial premature contractions 09/21/2011  . Postop Hyponatremia 07/28/2011  . Osteoarthritis of hip 07/27/2011  . PNEUMONIA 06/02/2010  . DYSPNEA ON EXERTION 05/30/2010  . ATYPICAL MYCOBACTERIAL INFECTION 05/05/2010  . HYPOTHYROIDISM 05/01/2010  . Ulcerative colitis ([place]) 03/02/2010  . HOARSENESS, CHRONIC 11/05/2009  . THRUSH 04/30/2009  . Asthma with bronchitis 04/30/2009  . HYPERLIPIDEMIA 07/11/2007  . ANEMIA, IRON DEFICIENCY, MICROCYTIC 07/11/2007  . LEUKOCYTOSIS 07/11/2007  . THROMBOCYTOSIS 07/11/2007  . Essential hypertension 07/11/2007  . Acute bronchitis 07/11/2007  . PHARYNGITIS, CHRONIC 07/11/2007  . Nonallergic vasomotor rhinitis 07/11/2007  . Esophageal reflux 07/11/2007  . DIVERTICULITIS, COLON 07/11/2007  .  ARTHRITIS 07/11/2007  . CARDIAC MURMUR 07/11/2007  . Diverticulitis of colon 07/11/2007    Past Surgical History:  Procedure Laterality Date  . ABDOMINAL HYSTERECTOMY  1985  . APPENDECTOMY    . COLONOSCOPY N/A 01/05/2013   Procedure: COLONOSCOPY;  Surgeon: Cleotis Nipper, MD;  Location: [facility] ENDOSCOPY;  Service: Endoscopy;  Laterality: N/A;  . COLONOSCOPY N/A  01/04/2014   Procedure: COLONOSCOPY;  Surgeon: Winfield Cunas., MD;  Location: [facility] ENDOSCOPY;  Service: Endoscopy;  Laterality: N/A;  . CRANIOTOMY    . DILATION AND CURETTAGE OF UTERUS  1967  . ESOPHAGOGASTRODUODENOSCOPY N/A 01/05/2013   Procedure: ESOPHAGOGASTRODUODENOSCOPY (EGD);  Surgeon: Cleotis Nipper, MD;  Location: [facility] ENDOSCOPY;  Service: Endoscopy;  Laterality: N/A;  . ESOPHAGOGASTRODUODENOSCOPY N/A 06/26/2014   Procedure: ESOPHAGOGASTRODUODENOSCOPY (EGD);  Surgeon: Winfield Cunas., MD;  Location: [facility] ENDOSCOPY;  Service: Endoscopy;  Laterality: N/A;  . EYE SURGERY     2003 -RIGHT CATARACT EXTRACTED AND LEFT WAS DONE IN 2004  . FLEXIBLE SIGMOIDOSCOPY N/A 06/26/2014   Procedure: FLEXIBLE SIGMOIDOSCOPY;  Surgeon: Winfield Cunas., MD;  Location: [facility] ENDOSCOPY;  Service: Endoscopy;  Laterality: N/A;  . FRONTALIS SUSPENSION  10-09-2010   lifting eyelids AND SECOND EYE SURGERY November 19, 2010  . meningioma resected  20O2  . PARTIAL COLECTOMY  2008  . subglottal mucocoel  2000  . THYROIDECTOMY  1986  . TONSILLECTOMY  1938  . TOTAL HIP ARTHROPLASTY  OCT 2006   right  . TOTAL HIP ARTHROPLASTY  07/27/2011   Procedure: TOTAL HIP ARTHROPLASTY;  Surgeon: Gearlean Alf, MD;  Location: [facility] ORS;  Service: Orthopedics;  Laterality: Left;  Marland Kitchen VESICOVAGINAL FISTULA CLOSURE W/ TAH    . WRIST TENDON LESION REMOVED 2007      Current Outpatient Rx  . Order #: [id number] Class: Historical Med  . Order #: [id number] Class: Normal  . Order #: [id number] Class: Normal  . Order #: [id number] Class: Historical Med  . Order #: [id number] Class: Historical Med  . Order #: [id number] Class: Historical Med  . Order #: [id number] Class: Normal  . Order #: [id number] Class: Historical Med  . Order #: [id number] Class: Historical Med  . Order #: [id number] Class: Historical Med  . Order #: [id number] Class: Normal  . Order #: [id number] Class: Historical Med  . Order #: [id number] Class: Historical Med  . Order #:  [id number] Class: Historical Med  . Order #: [id number] Class: Historical Med  . Order #: [id number] Class: Historical Med  . Order #: [id number] Class: Historical Med  . Order #: [id number] Class: Historical Med  . Order #: [id number] Class: Historical Med  . Order #: [id number] Class: Historical Med  . Order #: [id number] Class: Normal    Allergies Tape; Biaxin [clarithromycin]; Ciprofloxacin; Codeine; Dilantin [phenytoin]; Doxycycline; Lactose intolerance (gi); Restasis [cyclosporine]; and Sulfonamide derivatives  Family History  Problem Relation Age of Onset  . Heart attack Father     deceased    Social History Social History  Substance Use Topics  . Smoking status: Never Smoker  . Smokeless tobacco: Never Used  . Alcohol use Yes     Comment: occ glass of wine    Review of Systems  Constitutional: No fever/chills Eyes: No visual changes. ENT: No sore throat. Nose pain and bleeding.  Cardiovascular: Denies chest pain. Respiratory: Denies shortness of breath. Gastrointestinal: No abdominal pain.  No nausea, no vomiting.  No diarrhea.  No constipation. Genitourinary: Negative for dysuria. Musculoskeletal: Negative for back pain. Positive right hand pain.  Skin: Negative for rash. Neurological: Negative for headaches, focal weakness or numbness.  10-point ROS otherwise negative.  ____________________________________________   PHYSICAL EXAM:  VITAL SIGNS: ED Triage Vitals  Enc Vitals Group     BP 07/23/16 1500 137/79     Pulse Rate 07/23/16 1500 (!) 105     Resp --      Temp --      Temp src --      SpO2 07/23/16 1500 95 %     Pain Score 07/23/16 1507 1   Constitutional: Alert and oriented. Well appearing and in no acute distress. Eyes: Conjunctivae are normal. PERRL. EOMI. Head: Atraumatic. Ears:  Healthy appearing ear canals and TMs bilaterally Nose: Dried blood in both nostrils. Bruising and swelling along the bridge of the nose. No laceration. No septal hematoma.    Mouth/Throat: Mucous membranes are moist.  Oropharynx non-erythematous. No obvious tooth fracture.  Neck: No stridor.  C-collar in place. No trismus.  Cardiovascular: Normal rate, regular rhythm. Good peripheral circulation. Grossly normal heart sounds.   Respiratory: Normal respiratory effort.  No retractions. Lungs CTAB. Gastrointestinal: Soft and nontender. No distention.  Musculoskeletal: No lower extremity tenderness nor edema. No gross deformities of extremities. Neurologic:  Normal speech and language. No gross focal neurologic deficits are appreciated.  Skin:  Skin is warm, dry and intact. 3 cm skin tare on the right lateral hand. Old laceration to the left forearm. Ecchymosis under both eyes.  Psychiatric: Mood and affect are normal. Speech and behavior are normal.  ____________________________________________   LABS (all labs ordered are listed, but only abnormal results are displayed)  Labs Reviewed  I-STAT CHEM 8, ED - Abnormal; Notable for the following:       Result Value   Sodium 133 (*)    Chloride 94 (*)    BUN 26 (*)    Creatinine, Ser 1.10 (*)    Glucose, Bld 194 (*)    Hemoglobin 11.9 (*)    HCT 35.0 (*)    All other components within normal limits   ____________________________________________  EKG  Reviewed in MUSE.   Sinus rhythm. Normal axis. No ST elevation or depression. Normal intervals. No STEMI or acute arrhythmia.  ____________________________________________  RADIOLOGY  Dg Chest 2 View  Result Date: 07/23/2016 CLINICAL DATA:  Head injury. Tripped over curb and fell on face. Nasal deformity. Initial encounter. EXAM: CHEST  2 VIEW COMPARISON:  07/22/2016 FINDINGS: The cardiac silhouette is normal in size. Tortuosity and atherosclerosis are again noted of the thoracic aorta. No airspace consolidation, edema, pleural effusion, or pneumothorax is identified. No acute osseous abnormality is identified. IMPRESSION: No active cardiopulmonary disease.  Electronically Signed   By: Logan Bores M.D.   On: 07/23/2016 15:52   Dg Wrist Complete Right  Result Date: 07/23/2016 CLINICAL DATA:  Tripped over curb and fell. EXAM: RIGHT WRIST - COMPLETE 3+ VIEW COMPARISON:  None. FINDINGS: No definite acute fracture. No subluxation or dislocation. Irregularity at the radial styloid likely from previous trauma. Old ulnar styloid fracture identified. Chondrocalcinosis noted TFCC. IMPRESSION: Probable old radial styloid fracture with evidence of old ulnar styloid fracture. If symptoms persist/worsen in the lateral wrist, follow-up CT or MR could be used to evaluate for nondisplaced distal radius fracture. Electronically Signed   By: Misty Stanley M.D.   On: 07/23/2016 15:54   Ct Head Wo Contrast  Result Date: 07/23/2016 CLINICAL DATA:  Head injury. Fall today. Nose bleeding. Initial encounter. EXAM: CT HEAD WITHOUT CONTRAST CT MAXILLOFACIAL WITHOUT CONTRAST CT CERVICAL SPINE WITHOUT CONTRAST TECHNIQUE: Multidetector CT imaging of the  head, cervical spine, and maxillofacial structures were performed using the standard protocol without intravenous contrast. Multiplanar CT image reconstructions of the cervical spine and maxillofacial structures were also generated. COMPARISON:  Head CT 07/22/2016. Cervical spine CT 12/20/2014. Sinus CT 03/09/2012 and 04/24/2010. FINDINGS: CT HEAD FINDINGS Brain: Trace left falcine acute subdural hematoma has not significantly changed from yesterday's study, measuring up to 3 mm in thickness. There is also likely a trace amount of subdural hemorrhage along the superior aspect of the tentorium on the left. There is no evidence of new intracranial hemorrhage, acute cortically based infarct, mass, or midline shift. Mild cerebral atrophy is unchanged. Postsurgical changes are again seen with encephalomalacia in the left occipital and parietal lobes. Patchy cerebral white matter hypodensities elsewhere bilaterally are unchanged and nonspecific  but compatible with chronic small vessel ischemic disease, mild for age. Vascular: Calcified atherosclerosis at the skullbase. No hyperdense vessel. Skull: Prior left parietal craniotomy. No acute skull fracture identified. Other: None. CT MAXILLOFACIAL FINDINGS Osseous: Comminuted nasal bone fractures, mildly displaced laterally on the left. Fractures through the anterior, medial, and posterolateral maxillary sinus walls bilaterally, with mild displacement greater on the left. The left-sided fracture extends in a nondisplaced manner through the inferior left orbital rim. There are fractures of the frontal processes of the maxilla bilaterally, and there are bilateral pterygoid plate fractures. Fracture of the bony nasal septum with mild angulation posteriorly. The temporomandibular joints are located. Orbits: Bilateral cataract extraction.  No orbital hematoma. Sinuses: Near complete opacification of the maxillary sinuses by blood. Scattered blood elsewhere in the sinuses bilaterally. Clear mastoid air cells. Blood and secretions throughout the nasal cavity and in the nasopharynx. Soft tissues: Nasal soft tissue swelling. CT CERVICAL SPINE FINDINGS Alignment: Unchanged alignment from 2016. Mild reversal of the normal cervical lordosis with slight retrolisthesis of C4 on C5, C5 on C6, and C6 on C7. Skull base and vertebrae: No evidence of acute fracture or destructive osseous process. Soft tissues and spinal canal: No prevertebral fluid or swelling. No visible canal hematoma. Disc levels: Advanced disc degeneration from C3-4-C6-7. Moderate spinal stenosis at C4-5 and C5-6. Moderate to severe multilevel neural foraminal stenosis, left greater than right. Facet arthrosis, most advanced on the right at C2-3. Upper chest: Calcified atherosclerosis in the aortic arch. Other: Presumed prior right thyroidectomy. IMPRESSION: 1. Trace parafalcine subdural hematoma, unchanged from yesterday's head CT. 2. No evidence of new  intracranial abnormality. 3. Acute bilateral mid face fractures as above. 4. No evidence of acute cervical spine fracture or traumatic subluxation. Electronically Signed   By: Logan Bores M.D.   On: 07/23/2016 16:46   Ct Cervical Spine Wo Contrast  Result Date: 07/23/2016 CLINICAL DATA:  Head injury. Fall today. Nose bleeding. Initial encounter. EXAM: CT HEAD WITHOUT CONTRAST CT MAXILLOFACIAL WITHOUT CONTRAST CT CERVICAL SPINE WITHOUT CONTRAST TECHNIQUE: Multidetector CT imaging of the head, cervical spine, and maxillofacial structures were performed using the standard protocol without intravenous contrast. Multiplanar CT image reconstructions of the cervical spine and maxillofacial structures were also generated. COMPARISON:  Head CT 07/22/2016. Cervical spine CT 12/20/2014. Sinus CT 03/09/2012 and 04/24/2010. FINDINGS: CT HEAD FINDINGS Brain: Trace left falcine acute subdural hematoma has not significantly changed from yesterday's study, measuring up to 3 mm in thickness. There is also likely a trace amount of subdural hemorrhage along the superior aspect of the tentorium on the left. There is no evidence of new intracranial hemorrhage, acute cortically based infarct, mass, or midline shift. Mild cerebral atrophy is unchanged. Postsurgical  changes are again seen with encephalomalacia in the left occipital and parietal lobes. Patchy cerebral white matter hypodensities elsewhere bilaterally are unchanged and nonspecific but compatible with chronic small vessel ischemic disease, mild for age. Vascular: Calcified atherosclerosis at the skullbase. No hyperdense vessel. Skull: Prior left parietal craniotomy. No acute skull fracture identified. Other: None. CT MAXILLOFACIAL FINDINGS Osseous: Comminuted nasal bone fractures, mildly displaced laterally on the left. Fractures through the anterior, medial, and posterolateral maxillary sinus walls bilaterally, with mild displacement greater on the left. The left-sided  fracture extends in a nondisplaced manner through the inferior left orbital rim. There are fractures of the frontal processes of the maxilla bilaterally, and there are bilateral pterygoid plate fractures. Fracture of the bony nasal septum with mild angulation posteriorly. The temporomandibular joints are located. Orbits: Bilateral cataract extraction.  No orbital hematoma. Sinuses: Near complete opacification of the maxillary sinuses by blood. Scattered blood elsewhere in the sinuses bilaterally. Clear mastoid air cells. Blood and secretions throughout the nasal cavity and in the nasopharynx. Soft tissues: Nasal soft tissue swelling. CT CERVICAL SPINE FINDINGS Alignment: Unchanged alignment from 2016. Mild reversal of the normal cervical lordosis with slight retrolisthesis of C4 on C5, C5 on C6, and C6 on C7. Skull base and vertebrae: No evidence of acute fracture or destructive osseous process. Soft tissues and spinal canal: No prevertebral fluid or swelling. No visible canal hematoma. Disc levels: Advanced disc degeneration from C3-4-C6-7. Moderate spinal stenosis at C4-5 and C5-6. Moderate to severe multilevel neural foraminal stenosis, left greater than right. Facet arthrosis, most advanced on the right at C2-3. Upper chest: Calcified atherosclerosis in the aortic arch. Other: Presumed prior right thyroidectomy. IMPRESSION: 1. Trace parafalcine subdural hematoma, unchanged from yesterday's head CT. 2. No evidence of new intracranial abnormality. 3. Acute bilateral mid face fractures as above. 4. No evidence of acute cervical spine fracture or traumatic subluxation. Electronically Signed   By: Logan Bores M.D.   On: 07/23/2016 16:46   Ct Maxillofacial Wo Contrast  Result Date: 07/23/2016 CLINICAL DATA:  Head injury. Fall today. Nose bleeding. Initial encounter. EXAM: CT HEAD WITHOUT CONTRAST CT MAXILLOFACIAL WITHOUT CONTRAST CT CERVICAL SPINE WITHOUT CONTRAST TECHNIQUE: Multidetector CT imaging of the head,  cervical spine, and maxillofacial structures were performed using the standard protocol without intravenous contrast. Multiplanar CT image reconstructions of the cervical spine and maxillofacial structures were also generated. COMPARISON:  Head CT 07/22/2016. Cervical spine CT 12/20/2014. Sinus CT 03/09/2012 and 04/24/2010. FINDINGS: CT HEAD FINDINGS Brain: Trace left falcine acute subdural hematoma has not significantly changed from yesterday's study, measuring up to 3 mm in thickness. There is also likely a trace amount of subdural hemorrhage along the superior aspect of the tentorium on the left. There is no evidence of new intracranial hemorrhage, acute cortically based infarct, mass, or midline shift. Mild cerebral atrophy is unchanged. Postsurgical changes are again seen with encephalomalacia in the left occipital and parietal lobes. Patchy cerebral white matter hypodensities elsewhere bilaterally are unchanged and nonspecific but compatible with chronic small vessel ischemic disease, mild for age. Vascular: Calcified atherosclerosis at the skullbase. No hyperdense vessel. Skull: Prior left parietal craniotomy. No acute skull fracture identified. Other: None. CT MAXILLOFACIAL FINDINGS Osseous: Comminuted nasal bone fractures, mildly displaced laterally on the left. Fractures through the anterior, medial, and posterolateral maxillary sinus walls bilaterally, with mild displacement greater on the left. The left-sided fracture extends in a nondisplaced manner through the inferior left orbital rim. There are fractures of the frontal processes of the  maxilla bilaterally, and there are bilateral pterygoid plate fractures. Fracture of the bony nasal septum with mild angulation posteriorly. The temporomandibular joints are located. Orbits: Bilateral cataract extraction.  No orbital hematoma. Sinuses: Near complete opacification of the maxillary sinuses by blood. Scattered blood elsewhere in the sinuses bilaterally.  Clear mastoid air cells. Blood and secretions throughout the nasal cavity and in the nasopharynx. Soft tissues: Nasal soft tissue swelling. CT CERVICAL SPINE FINDINGS Alignment: Unchanged alignment from 2016. Mild reversal of the normal cervical lordosis with slight retrolisthesis of C4 on C5, C5 on C6, and C6 on C7. Skull base and vertebrae: No evidence of acute fracture or destructive osseous process. Soft tissues and spinal canal: No prevertebral fluid or swelling. No visible canal hematoma. Disc levels: Advanced disc degeneration from C3-4-C6-7. Moderate spinal stenosis at C4-5 and C5-6. Moderate to severe multilevel neural foraminal stenosis, left greater than right. Facet arthrosis, most advanced on the right at C2-3. Upper chest: Calcified atherosclerosis in the aortic arch. Other: Presumed prior right thyroidectomy. IMPRESSION: 1. Trace parafalcine subdural hematoma, unchanged from yesterday's head CT. 2. No evidence of new intracranial abnormality. 3. Acute bilateral mid face fractures as above. 4. No evidence of acute cervical spine fracture or traumatic subluxation. Electronically Signed   By: Logan Bores M.D.   On: 07/23/2016 16:46    ____________________________________________   PROCEDURES  Procedure(s) performed:   Procedures   ____________________________________________   INITIAL IMPRESSION / ASSESSMENT AND PLAN / ED COURSE  Pertinent labs & imaging results that were available during my care of the patient were reviewed by me and considered in my medical decision making (see chart for details).  Patient resents to the emergency department for evaluation after mechanical fall. She has swelling at the bridge of the nose with mild bleeding from both nostrils some dried blood. Occasionally spitting blood from her mouth. No obvious laceration, hematoma, or open fracture. She has a skin tear to the right hand with no tenderness to palpation of the wrist. Full range of motion of both  knees and hips. Patient tripped and fell landing against the curb. She was seen in the emergency department yesterday with syncope but denies his symptoms with this incident. Unsure of her last tetanus shot. Plan for imaging and reassessment.  06:00 PM Reviewed CT images and radiology reads. SDH unchanged from yesterday's CT. Spoke with Dr. Marla Roe with ENT regarding the face fractures. She advises that the patient call her office at 8 AM tomorrow and she can be seen at 8:30 or 9 AM. Advised absolutely no chewing and to only drink liquids. This may require outpatient surgical management but not emergently. No abx recommended. I discussed this with the patient and husband in detail. She is awake and alert. She is sitting up and no longer spitting blood. No acute distress. No evidence of CSF leak on exam. No abnormal eye movements. Will discharge home with strict return precautions and ENT follow up tomorrow.   At this time, I do not feel there is any life-threatening condition present. I have reviewed and discussed all results (EKG, imaging, lab, urine as appropriate), exam findings with patient. I have reviewed nursing notes and appropriate previous records.  I feel the patient is safe to be discharged home without further emergent workup. Discussed usual and customary return precautions. Patient and family (if present) verbalize understanding and are comfortable with this plan.  Patient will follow-up with their primary care provider. If they do not have a primary care provider, information  for follow-up has been provided to them. All questions have been answered.  ____________________________________________  FINAL CLINICAL IMPRESSION(S) / ED DIAGNOSES  Final diagnoses:  Fall, initial encounter  Injury of head, initial encounter  Closed fracture of facial bone due to fall, initial encounter ([place])  Strain of neck muscle, initial encounter     MEDICATIONS GIVEN DURING THIS VISIT:  Medications   acetaminophen (TYLENOL) tablet 1,000 mg (1,000 mg Oral Given 07/23/16 1818)     NEW OUTPATIENT MEDICATIONS STARTED DURING THIS VISIT:  None   Note:  This document was prepared using Dragon voice recognition software and may include unintentional dictation errors.  Nanda Quinton, MD Emergency Medicine   Margette Fast, MD 07/24/16 1037

## 2016-07-23 NOTE — Telephone Encounter (Signed)
Helene Kelp at front desk spoke with pt husband today. He was talking about his wife's appt with Dr. Gwenlyn Found tomorrow 07/24/16 and was confused about her DOB. Stated his wife is in [facility] ED because she broke her nose. Helene Kelp said pt husband was already gone so I called the pt and LM--ok to leave message on home phone per DPR--about correct appt information. Dr. Gwenlyn Found is not in the office tomorrow. Her appt is on Tuesday, 07/28/16 at 2:15. Told pt or pt husband to call with any questions or concerns.

## 2016-07-23 NOTE — ED Notes (Signed)
Upon entering room pt husband cleaning of pt face. Dried blood noted. No active bleeding seen. Pt using suction in mouth for secretions.

## 2016-07-23 NOTE — ED Triage Notes (Signed)
Pt to ER by [facility] after tripping over the curb and falling hitting her face. Deformity and swelling noted to nose. Pt nose bleeding continuously posteriorly, requiring frequent suction. Pt is a/o x4. Did not lose consciousness. Not on any anticoagulants. VSS.

## 2016-07-26 ENCOUNTER — Emergency Department (HOSPITAL_COMMUNITY)
Admission: EM | Admit: 2016-07-26 | Discharge: 2016-07-26 | Disposition: A | Discharge status: Home / Self Care | Payer: Medicare Other | Attending: Emergency Medicine | Admitting: Emergency Medicine

## 2016-07-26 ENCOUNTER — Encounter (HOSPITAL_COMMUNITY): Payer: Self-pay

## 2016-07-26 ENCOUNTER — Emergency Department (HOSPITAL_COMMUNITY): Payer: Medicare Other

## 2016-07-26 DIAGNOSIS — S065X0A Traumatic subdural hemorrhage without loss of consciousness, initial encounter: Secondary | ICD-10-CM | POA: Insufficient documentation

## 2016-07-26 DIAGNOSIS — I129 Hypertensive chronic kidney disease with stage 1 through stage 4 chronic kidney disease, or unspecified chronic kidney disease: Secondary | ICD-10-CM | POA: Insufficient documentation

## 2016-07-26 DIAGNOSIS — W1830XA Fall on same level, unspecified, initial encounter: Secondary | ICD-10-CM | POA: Diagnosis not present

## 2016-07-26 DIAGNOSIS — S0083XA Contusion of other part of head, initial encounter: Secondary | ICD-10-CM | POA: Diagnosis not present

## 2016-07-26 DIAGNOSIS — Y929 Unspecified place or not applicable: Secondary | ICD-10-CM | POA: Diagnosis not present

## 2016-07-26 DIAGNOSIS — N182 Chronic kidney disease, stage 2 (mild): Secondary | ICD-10-CM | POA: Insufficient documentation

## 2016-07-26 DIAGNOSIS — E039 Hypothyroidism, unspecified: Secondary | ICD-10-CM | POA: Insufficient documentation

## 2016-07-26 DIAGNOSIS — Z79899 Other long term (current) drug therapy: Secondary | ICD-10-CM | POA: Diagnosis not present

## 2016-07-26 DIAGNOSIS — J45909 Unspecified asthma, uncomplicated: Secondary | ICD-10-CM | POA: Diagnosis not present

## 2016-07-26 DIAGNOSIS — S065XAA Traumatic subdural hemorrhage with loss of consciousness status unknown, initial encounter: Secondary | ICD-10-CM

## 2016-07-26 DIAGNOSIS — Z96642 Presence of left artificial hip joint: Secondary | ICD-10-CM | POA: Diagnosis not present

## 2016-07-26 DIAGNOSIS — Y999 Unspecified external cause status: Secondary | ICD-10-CM | POA: Diagnosis not present

## 2016-07-26 DIAGNOSIS — S0990XA Unspecified injury of head, initial encounter: Secondary | ICD-10-CM

## 2016-07-26 DIAGNOSIS — Y939 Activity, unspecified: Secondary | ICD-10-CM | POA: Diagnosis not present

## 2016-07-26 DIAGNOSIS — S065X9A Traumatic subdural hemorrhage with loss of consciousness of unspecified duration, initial encounter: Secondary | ICD-10-CM

## 2016-07-26 LAB — CBC WITH DIFFERENTIAL/PLATELET
Basophils Absolute: 0 10*3/uL (ref 0.0–0.1)
Basophils Relative: 0 %
Eosinophils Absolute: 0 10*3/uL (ref 0.0–0.7)
Eosinophils Relative: 0 %
HEMATOCRIT: 37.1 % (ref 36.0–46.0)
HEMOGLOBIN: 11.8 g/dL — AB (ref 12.0–15.0)
LYMPHS PCT: 7 %
Lymphs Abs: 1 10*3/uL (ref 0.7–4.0)
MCH: 28.4 pg (ref 26.0–34.0)
MCHC: 31.8 g/dL (ref 30.0–36.0)
MCV: 89.2 fL (ref 78.0–100.0)
MONO ABS: 0.5 10*3/uL (ref 0.1–1.0)
MONOS PCT: 3 %
NEUTROS ABS: 12 10*3/uL — AB (ref 1.7–7.7)
Neutrophils Relative %: 90 %
Platelets: 306 10*3/uL (ref 150–400)
RBC: 4.16 MIL/uL (ref 3.87–5.11)
RDW: 16.8 % — AB (ref 11.5–15.5)
WBC: 13.5 10*3/uL — ABNORMAL HIGH (ref 4.0–10.5)

## 2016-07-26 LAB — COMPREHENSIVE METABOLIC PANEL
ALBUMIN: 3.7 g/dL (ref 3.5–5.0)
ALK PHOS: 45 U/L (ref 38–126)
ALT: 20 U/L (ref 14–54)
ANION GAP: 11 (ref 5–15)
AST: 22 U/L (ref 15–41)
BILIRUBIN TOTAL: 0.6 mg/dL (ref 0.3–1.2)
BUN: 16 mg/dL (ref 6–20)
CALCIUM: 10 mg/dL (ref 8.9–10.3)
CO2: 30 mmol/L (ref 22–32)
Chloride: 93 mmol/L — ABNORMAL LOW (ref 101–111)
Creatinine, Ser: 0.87 mg/dL (ref 0.44–1.00)
GFR calc Af Amer: >60 mL/min (ref >60)
GFR, EST NON AFRICAN AMERICAN: 59 mL/min — AB (ref >60)
GLUCOSE: 131 mg/dL — AB (ref 65–99)
Potassium: 4.1 mmol/L (ref 3.5–5.1)
Sodium: 134 mmol/L — ABNORMAL LOW (ref 135–145)
TOTAL PROTEIN: 6.8 g/dL (ref 6.5–8.1)

## 2016-07-26 NOTE — ED Notes (Signed)
Dr. Billy Fischer reviewed D/C instructions with patient and patient verbalizes understanding. Helped to get dressed and wheeled out today

## 2016-07-26 NOTE — ED Triage Notes (Signed)
Patient was seen here on 3/15 for a fall and was told she had a small subdural and facial fractures. SHe lives in independent living with her husband and the aids that normally check on her called out because she seems more confused than normal. A/Ox4  And only a slight deficit in flexion on the L side.

## 2016-07-26 NOTE — ED Provider Notes (Signed)
[facility] DEPT Provider Note   CSN: [id number] Arrival date & time: 07/26/16  1137     History   Chief Complaint Chief Complaint  Patient presents with  . Altered Mental Status    HPI Sierra [name] is a 83 y.o. female.  HPI   Had a fall last Thursday, found to have small subdural hematoma, facial fractures.  Followed up with facial surgeon. Lives in independent living On liquid diet  Patient reports not in pain, feels good, feels hungry. Has bruises. Sleeping well.  Husband reports she is not always remembering that she took her pills 45min before and this is new.  No new neuro deficits, indlucing no facial droop, no numbness/weakness.  Denies CP/SOB, infectious symptoms, headaches, vomiting.   Patient reports she feels pretty good except she wishes she could eat more than liquid diet. Husband reports his daughter is nurse aneasthetist and wanted her checked out due to memory issues.    Past Medical History:  Diagnosis Date  . Acute asthmatic bronchitis   . Allergic rhinitis   . Arthritis    Hx R shoulder bursitis, pain L knee and L hip  . Asthmatic bronchitis   . Balance problem    SINCE BRAIN TUMOR REMOVED IN 2002-BENIGN TUMOR-PT HAS ADRENAL INSUFFICIENCY AND TAKES DAILY PREDNISONE  . Blood transfusion   . Cardiac murmur    DOES NOT CAUSE ANY SYMPTOMS  . Chronic kidney disease   . Chronic pharyngitis   . Diverticulitis, colon   . Dizziness   . DOE (dyspnea on exertion)   . Esophageal reflux   . GI bleed 01/06/2013  . Hyperlipemia   . Hypertension   . Hypothyroidism   . IBS (irritable bowel syndrome)   . Leukocytosis   . Other specified iron deficiency anemias   . Recurrent upper respiratory infection (URI) 07/10/2011    ACUTE BRONCHITIS - extra prednisone in addition to the daily prednisone and a Z-Pak  . Thrombocytosis ([place])   . Ulceration, colon   . Ulcerative colitis     Patient Active Problem List   Diagnosis Date Noted  . Edema  extremities 07/13/2016  . Influenza due to identified novel influenza A virus with other respiratory manifestations 06/11/2016  . Influenza A 06/11/2016  . Acute respiratory failure with hypoxia ([place]) 06/11/2016  . Influenza A with respiratory manifestations 06/11/2016  . Anemia 12/12/2014  . Anxiety 12/12/2014  . Asthma 12/12/2014  . Chronic kidney disease, stage II (mild) 12/12/2014  . H/O: GI bleed 12/12/2014  . Postural lightheadedness 12/12/2014  . Rectal bleeding 01/03/2014  . Diverticulosis 01/03/2014  . Adrenal insufficiency ([place]) 01/03/2014  . Ulcerative colitis, chronic ([place]) 01/03/2014  . Asthma with acute exacerbation 06/23/2013  . Acute blood loss anemia 01/06/2013  . LGI bleed 01/05/2013  . Malaise and fatigue 09/21/2011  . Benign hypertensive heart disease without heart failure 09/21/2011  . Atrial premature contractions 09/21/2011  . Postop Hyponatremia 07/28/2011  . Osteoarthritis of hip 07/27/2011  . PNEUMONIA 06/02/2010  . DYSPNEA ON EXERTION 05/30/2010  . ATYPICAL MYCOBACTERIAL INFECTION 05/05/2010  . HYPOTHYROIDISM 05/01/2010  . Ulcerative colitis ([place]) 03/02/2010  . HOARSENESS, CHRONIC 11/05/2009  . THRUSH 04/30/2009  . Asthma with bronchitis 04/30/2009  . HYPERLIPIDEMIA 07/11/2007  . ANEMIA, IRON DEFICIENCY, MICROCYTIC 07/11/2007  . LEUKOCYTOSIS 07/11/2007  . THROMBOCYTOSIS 07/11/2007  . Essential hypertension 07/11/2007  . Acute bronchitis 07/11/2007  . PHARYNGITIS, CHRONIC 07/11/2007  . Nonallergic vasomotor rhinitis 07/11/2007  . Esophageal reflux 07/11/2007  . DIVERTICULITIS, COLON  07/11/2007  . ARTHRITIS 07/11/2007  . CARDIAC MURMUR 07/11/2007  . Diverticulitis of colon 07/11/2007    Past Surgical History:  Procedure Laterality Date  . ABDOMINAL HYSTERECTOMY  1985  . APPENDECTOMY    . COLONOSCOPY N/A 01/05/2013   Procedure: COLONOSCOPY;  Surgeon: Cleotis Nipper, MD;  Location[facility] ENDOSCOPY;  Service: Endoscopy;  Laterality: N/A;  .  COLONOSCOPY N/A 01/04/2014   Procedure: COLONOSCOPY;  Surgeon: Winfield Cunas., MD;  Location: [facility] ENDOSCOPY;  Service: Endoscopy;  Laterality: N/A;  . CRANIOTOMY    . DILATION AND CURETTAGE OF UTERUS  1967  . ESOPHAGOGASTRODUODENOSCOPY N/A 01/05/2013   Procedure: ESOPHAGOGASTRODUODENOSCOPY (EGD);  Surgeon: Cleotis Nipper, MD;  Location: [facility] ENDOSCOPY;  Service: Endoscopy;  Laterality: N/A;  . ESOPHAGOGASTRODUODENOSCOPY N/A 06/26/2014   Procedure: ESOPHAGOGASTRODUODENOSCOPY (EGD);  Surgeon: Winfield Cunas., MD;  Location: [facility] ENDOSCOPY;  Service: Endoscopy;  Laterality: N/A;  . EYE SURGERY     2003 -RIGHT CATARACT EXTRACTED AND LEFT WAS DONE IN 2004  . FLEXIBLE SIGMOIDOSCOPY N/A 06/26/2014   Procedure: FLEXIBLE SIGMOIDOSCOPY;  Surgeon: Winfield Cunas., MD;  Location: [facility] ENDOSCOPY;  Service: Endoscopy;  Laterality: N/A;  . FRONTALIS SUSPENSION  10-09-2010   lifting eyelids AND SECOND EYE SURGERY November 19, 2010  . meningioma resected  20O2  . PARTIAL COLECTOMY  2008  . subglottal mucocoel  2000  . THYROIDECTOMY  1986  . TONSILLECTOMY  1938  . TOTAL HIP ARTHROPLASTY  OCT 2006   right  . TOTAL HIP ARTHROPLASTY  07/27/2011   Procedure: TOTAL HIP ARTHROPLASTY;  Surgeon: Gearlean Alf, MD;  Location: [facility] ORS;  Service: Orthopedics;  Laterality: Left;  Marland Kitchen VESICOVAGINAL FISTULA CLOSURE W/ TAH    . WRIST TENDON LESION REMOVED 2007      OB History    No data available       Home Medications    Prior to Admission medications   Medication Sig Start Date End Date Taking? Authorizing Provider  acetaminophen (TYLENOL) 325 MG tablet Take 325-650 mg by mouth every 6 (six) hours as needed (for headaches or pain).    Historical Provider, MD  albuterol (PROVENTIL HFA;VENTOLIN HFA) 108 (90 Base) MCG/ACT inhaler Inhale 2 puffs into the lungs every 6 (six) hours as needed for wheezing or shortness of breath. 06/12/16 10/02/20  Deneise Lever, MD  albuterol (PROVENTIL) (2.5 MG/3ML) 0.083% nebulizer  solution Take 3 mLs (2.5 mg total) by nebulization every 6 (six) hours as needed for wheezing or shortness of breath. 06/24/16   Deneise Lever, MD  amLODipine (NORVASC) 2.5 MG tablet Take 2.5 mg by mouth daily.    Historical Provider, MD  Artificial Tear Solution (BION TEARS) 0.1-0.3 % SOLN Place 1 drop into both eyes every morning.     Historical Provider, MD  Artificial Tear Solution (GENTEAL TEARS) 0.1-0.2-0.3 % SOLN Place 1 drop into both eyes at bedtime.    Historical Provider, MD  B Complex Vitamins (VITAMIN B COMPLEX PO) Take 1 tablet by mouth daily.    Historical Provider, MD  benzonatate (TESSALON) 100 MG capsule Take 1 capsule (100 mg total) by mouth 3 (three) times daily as needed for cough. 07/09/16   Magdalen Spatz, NP  BIOTIN PO Take 1 tablet by mouth daily.    Historical Provider, MD  budesonide-formoterol (SYMBICORT) 160-4.5 MCG/ACT inhaler Inhale 2 puffs into the lungs 2 (two) times daily. Patient not taking: Reported on 07/23/2016 06/30/16   Melvenia Needles, NP  cholecalciferol (  VITAMIN D) 1000 UNITS tablet Take 1,000 Units by mouth daily.    Historical Provider, MD  Cyanocobalamin (VITAMIN B-12) 2500 MCG SUBL Take 2,500 mcg by mouth daily.     Historical Provider, MD  hydrochlorothiazide (MICROZIDE) 12.5 MG capsule Take 1 capsule (12.5 mg total) by mouth daily. 07/13/16 10/11/16  Erlene Quan, PA-C  lactase (LACTAID) 3000 UNITS tablet Take 3,000 Units by mouth daily as needed (lactose intolerant).    Historical Provider, MD  levothyroxine (SYNTHROID, LEVOTHROID) 75 MCG tablet Take 75 mcg by mouth daily before breakfast.     Historical Provider, MD  LORazepam (ATIVAN) 0.5 MG tablet Take 1 tablet by mouth at bedtime as needed for sleep.  06/16/16   Historical Provider, MD  Multiple Vitamin (MULTIVITAMIN) tablet Take 1 tablet by mouth daily.    Historical Provider, MD  NON FORMULARY Flutter device    Historical Provider, MD  predniSONE (DELTASONE) 10 MG tablet Take 10 mg by mouth daily with  breakfast.    Historical Provider, MD  Probiotic Product (PROBIOTIC DAILY PO) Take 1 capsule by mouth daily.     Historical Provider, MD  ranitidine (ZANTAC) 150 MG tablet Take 150 mg by mouth 2 (two) times daily.     Historical Provider, MD    Family History Family History  Problem Relation Age of Onset  . Heart attack Father     deceased    Social History Social History  Substance Use Topics  . Smoking status: Never Smoker  . Smokeless tobacco: Never Used  . Alcohol use Yes     Comment: occ glass of wine     Allergies   Tape; Biaxin [clarithromycin]; Ciprofloxacin; Codeine; Dilantin [phenytoin]; Doxycycline; Lactose intolerance (gi); Restasis [cyclosporine]; and Sulfonamide derivatives   Review of Systems Review of Systems  Constitutional: Negative for fever.  HENT: Negative for sore throat.   Eyes: Negative for visual disturbance.  Respiratory: Negative for cough and shortness of breath.   Cardiovascular: Negative for chest pain.  Gastrointestinal: Negative for abdominal pain, nausea and vomiting.  Genitourinary: Negative for difficulty urinating.  Musculoskeletal: Negative for back pain and neck pain.  Skin: Negative for rash.  Neurological: Negative for syncope, facial asymmetry, weakness, numbness and headaches.     Physical Exam Updated Vital Signs BP 130/72   Pulse 88   Temp 97.9 F (36.6 C) (Oral)   Resp 20   SpO2 96%   Physical Exam  Constitutional: She is oriented to person, place, and time. She appears well-developed and well-nourished. No distress.  HENT:  Head: Normocephalic.  Mouth/Throat: No oropharyngeal exudate.  Bilateral facial contusions  Eyes: Conjunctivae and EOM are normal. Pupils are equal, round, and reactive to light.  Neck: Normal range of motion.  Cardiovascular: Normal rate, regular rhythm, normal heart sounds and intact distal pulses.  Exam reveals no gallop and no friction rub.   No murmur heard. Pulmonary/Chest: Effort  normal and breath sounds normal. No respiratory distress. She has no wheezes. She has no rales.  Abdominal: Soft. She exhibits no distension. There is no tenderness. There is no guarding.  Musculoskeletal: She exhibits no edema or tenderness.  Neurological: She is alert and oriented to person, place, and time. She has normal strength. No cranial nerve deficit or sensory deficit. GCS eye subscore is 4. GCS verbal subscore is 5. GCS motor subscore is 6.  Skin: Skin is warm and dry. No rash noted. She is not diaphoretic. No erythema.  Nursing note and vitals reviewed.  ED Treatments / Results  Labs (all labs ordered are listed, but only abnormal results are displayed) Labs Reviewed  CBC WITH DIFFERENTIAL/PLATELET - Abnormal; Notable for the following:       Result Value   WBC 13.5 (*)    Hemoglobin 11.8 (*)    RDW 16.8 (*)    Neutro Abs 12.0 (*)    All other components within normal limits  COMPREHENSIVE METABOLIC PANEL - Abnormal; Notable for the following:    Sodium 134 (*)    Chloride 93 (*)    Glucose, Bld 131 (*)    GFR calc non Af Amer 59 (*)    All other components within normal limits    EKG  EKG Interpretation None       Radiology Ct Head Wo Contrast  Result Date: 07/26/2016 CLINICAL DATA:  Headache memory loss and facial pain. Status post fall 1 week ago. EXAM: CT HEAD WITHOUT CONTRAST TECHNIQUE: Contiguous axial images were obtained from the base of the skull through the vertex without intravenous contrast. COMPARISON:  None. FINDINGS: Brain: There is stable in size trace left parafalcine subdural hematoma, measuring 3 mm in maximum thickness. Otherwise, there is no evidence of intracranial hemorrhage, cortical infarct, mass effect or midline shift. There is stable cerebral atrophy and periventricular microangiopathy. Postsurgical changes with encephalomalacia in the left occipital lobe are stable. Vascular: No hyperdense vessel or unexpected calcification. Skull: No  evidence of acute skull fractures. Sinuses/Orbits: Hemorrhagic effusions within bilateral maxillary sinuses, likely due to known bilateral mid face fractures. Other: None. IMPRESSION: Stable in size left parafalcine subdural hematoma, measuring 3 mm in maximum thickness. Stable brain parenchymal volume loss and periventricular microangiopathy. Stable postsurgical changes with encephalomalacia of the left occipital lobe. Hemorrhagic effusions of bilateral maxillary sinuses, likely due to known bilateral mid face fractures. Electronically Signed   By: Fidela Salisbury M.D.   On: 07/26/2016 14:28    Procedures Procedures (including critical care time)  Medications Ordered in ED Medications - No data to display   Initial Impression / Assessment and Plan / ED Course  I have reviewed the triage vital signs and the nursing notes.  Pertinent labs & imaging results that were available during my care of the patient were reviewed by me and considered in my medical decision making (see chart for details).    83yo female with history of htn, hlpd, hypothyroidism, bronchitis, recent fall with small subdural and facial fractures, presents at request of her daughter with concern for memory difficulties since fall.  Family reports mild difference, patient is alert and oriented x4, normal neuro exam, feeling well. Basic labs without acute changes. Leukocytosis nonspecific, no symptoms to suggest infection.  No blood thinners.  Repeat head CT without change from last week.  No symptoms to suggest acute CVA.  Suspect mild change noted secondary to recent trauma.  Recommend continued supportive care and outpt follow up.   Final Clinical Impressions(s) / ED Diagnoses   Final diagnoses:  Contusion of face, initial encounter  Traumatic injury of head, initial encounter  Subdural hematoma ([place]), unchanged    New Prescriptions Discharge Medication List as of 07/26/2016  2:53 PM       Gareth Morgan,  MD 07/26/16 2037

## 2016-07-26 NOTE — ED Notes (Signed)
In imaging.

## 2016-07-26 NOTE — ED Notes (Signed)
Called patients husband to tell him she is ready for D/C

## 2016-07-28 ENCOUNTER — Ambulatory Visit (INDEPENDENT_AMBULATORY_CARE_PROVIDER_SITE_OTHER): Payer: Medicare Other | Admitting: Cardiovascular Disease

## 2016-07-28 ENCOUNTER — Encounter: Payer: Self-pay | Admitting: Cardiovascular Disease

## 2016-07-28 DIAGNOSIS — I10 Essential (primary) hypertension: Secondary | ICD-10-CM

## 2016-07-28 NOTE — Progress Notes (Signed)
07/28/2016 Sierra [name]   Oct 28, 1928  [id number]  Primary Physician ARONSON,RICHARD A, MD Primary Cardiologist: Lorretta Harp MD Renae Gloss  HPI:  Sierra [name] is a delightful 83 year old thin-appearing married Caucasian female mother of 2, grandmother of 8 grandchildren who is accompanied by her husband today was also is patient of mine. She was referred by Dr. Dr. Reynaldo [name] for cardiovascular evaluation because of new-onset dyspnea on exertion. I last saw her in the office 01/14/16. She has a history of hypertension and hyperlipidemia both treated. Her father died of a myocardial infarction at age 35. She has reactive airways disease treated by Dr. Annamaria Boots. She had a benign meningioma surgically removed with forced [facility] approximately 15 years ago. They moved into a retirement community recently and she's noticed increasing dyspnea over the last 2 months. She denies chest pain. A 2-D echocardiogram performed 10/05/11 was normal. She had a 2-D echocardiogram and a Myoview stress test both of which were normal. Unfortunately, she tripped and fell several weeks ago and hit her face on the ground resulting in significant facial trauma although noninvasive evaluation was not concerning.   Current Outpatient Prescriptions  Medication Sig Dispense Refill  . acetaminophen (TYLENOL) 325 MG tablet Take 325-650 mg by mouth every 6 (six) hours as needed (for headaches or pain).    Marland Kitchen albuterol (PROVENTIL HFA;VENTOLIN HFA) 108 (90 Base) MCG/ACT inhaler Inhale 2 puffs into the lungs every 6 (six) hours as needed for wheezing or shortness of breath. 1 Inhaler 5  . albuterol (PROVENTIL) (2.5 MG/3ML) 0.083% nebulizer solution Take 3 mLs (2.5 mg total) by nebulization every 6 (six) hours as needed for wheezing or shortness of breath. 150 mL 5  . amLODipine (NORVASC) 2.5 MG tablet Take 2.5 mg by mouth daily.    . Artificial Tear Solution (BION TEARS) 0.1-0.3 % SOLN Place 1 drop into  both eyes every morning.     . Artificial Tear Solution (GENTEAL TEARS) 0.1-0.2-0.3 % SOLN Place 1 drop into both eyes at bedtime.    . B Complex Vitamins (VITAMIN B COMPLEX PO) Take 1 tablet by mouth daily.    . benzonatate (TESSALON) 100 MG capsule Take 1 capsule (100 mg total) by mouth 3 (three) times daily as needed for cough. 30 capsule 0  . BIOTIN PO Take 1 tablet by mouth daily.    . budesonide-formoterol (SYMBICORT) 160-4.5 MCG/ACT inhaler Inhale 2 puffs into the lungs 2 (two) times daily. 1 Inhaler 5  . cholecalciferol (VITAMIN D) 1000 UNITS tablet Take 1,000 Units by mouth daily.    . Cyanocobalamin (VITAMIN B-12) 2500 MCG SUBL Take 2,500 mcg by mouth daily.     . hydrochlorothiazide (MICROZIDE) 12.5 MG capsule Take 1 capsule (12.5 mg total) by mouth daily. 30 capsule 5  . lactase (LACTAID) 3000 UNITS tablet Take 3,000 Units by mouth daily as needed (lactose intolerant).    Marland Kitchen levothyroxine (SYNTHROID, LEVOTHROID) 75 MCG tablet Take 75 mcg by mouth daily before breakfast.     . LORazepam (ATIVAN) 0.5 MG tablet Take 1 tablet by mouth at bedtime as needed for sleep.     . Multiple Vitamin (MULTIVITAMIN) tablet Take 1 tablet by mouth daily.    . NON FORMULARY Flutter device    . predniSONE (DELTASONE) 10 MG tablet Take 10 mg by mouth daily with breakfast.    . Probiotic Product (PROBIOTIC DAILY PO) Take 1 capsule by mouth daily.     . ranitidine (ZANTAC) 150  MG tablet Take 150 mg by mouth 2 (two) times daily.      No current facility-administered medications for this visit.     Allergies  Allergen Reactions  . Sulfamethoxazole Nausea And Vomiting  . Tape Other (See Comments)    TAPE TEARS AND BRUISES THE SKIN!!  . Biaxin [Clarithromycin] Other (See Comments)    Bitter taste  . Ciprofloxacin Rash  . Codeine Nausea And Vomiting  . Dilantin [Phenytoin] Rash  . Doxycycline Nausea Only    Very nauseated  . Lactose Intolerance (Gi) Other (See Comments)    gas  . Restasis  [Cyclosporine] Other (See Comments) and Swelling    Eyes burn   . Sulfonamide Derivatives Rash    Social History   Social History  . Marital status: Married    Spouse name: N/A  . Number of children: 2  . Years of education: N/A   Occupational History  . Retired Loss adjuster, chartered, [place] History Main Topics  . Smoking status: Never Smoker  . Smokeless tobacco: Never Used  . Alcohol use Yes     Comment: occ glass of wine  . Drug use: No  . Sexual activity: No   Other Topics Concern  . 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  Cardiovascular: negative for chest pain, dyspnea on exertion, edema, orthopnea, palpitations, paroxysmal nocturnal dyspnea or shortness of breath Dermatological: negative for rash Respiratory: negative for cough or wheezing Urologic: negative for hematuria Abdominal: negative for nausea, vomiting, diarrhea, bright red blood per rectum, melena, or hematemesis Neurologic: negative for visual changes, syncope, or dizziness All other systems reviewed and are otherwise negative except as noted above.    Blood pressure 131/70, pulse (!) 103, height 5\' 3"  (1.6 m), weight 130 lb (59 kg), SpO2 94 %.  General appearance: alert and no distress Neck: no adenopathy, no carotid bruit, no JVD, supple, symmetrical, trachea midline and thyroid not enlarged, symmetric, no tenderness/mass/nodules Lungs: clear to auscultation bilaterally Heart: regular rate and rhythm, S1, S2 normal, no murmur, click, rub or gallop Extremities: extremities normal, atraumatic, no cyanosis or edema  EKG not performed today  ASSESSMENT AND PLAN:   HYPERLIPIDEMIA History of hyperlipidemia not on statin therapy followed by his PCP  Essential hypertension History of  hypertension blood pressure measures 131/70. She is on low-dose amlodipine and hydrochlorothiazide. Continue current meds at current dosing  DYSPNEA ON EXERTION History of dyspnea on exertion with a normal 2-D echo and Myoview stress test. On further questioning it does not seem her dyspnea is out of proportion to activity and age.      Lorretta Harp MD FACP,FACC,FAHA, [facility] 07/28/2016 2:38 PM

## 2016-07-28 NOTE — Assessment & Plan Note (Signed)
History of hypertension blood pressure measures 131/70. She is on low-dose amlodipine and hydrochlorothiazide. Continue current meds at current dosing

## 2016-07-28 NOTE — Assessment & Plan Note (Signed)
History of dyspnea on exertion with a normal 2-D echo and Myoview stress test. On further questioning it does not seem her dyspnea is out of proportion to activity and age.

## 2016-07-28 NOTE — Assessment & Plan Note (Signed)
History of hyperlipidemia not on statin therapy followed by his PCP 

## 2016-07-28 NOTE — Patient Instructions (Signed)
We request that you follow-up in: 6 months with an extender and in 12 months with Dr Berry  You will receive a reminder letter in the mail two months in advance. If you don't receive a letter, please call our office to schedule the follow-up appointment.   

## 2016-08-24 ENCOUNTER — Ambulatory Visit (INDEPENDENT_AMBULATORY_CARE_PROVIDER_SITE_OTHER): Payer: Medicare Other | Admitting: Internal Medicine

## 2016-08-24 ENCOUNTER — Encounter: Payer: Self-pay | Admitting: Internal Medicine

## 2016-08-24 DIAGNOSIS — J300 Vasomotor rhinitis: Secondary | ICD-10-CM

## 2016-08-24 DIAGNOSIS — J45909 Unspecified asthma, uncomplicated: Secondary | ICD-10-CM | POA: Diagnosis not present

## 2016-08-24 NOTE — Assessment & Plan Note (Signed)
Moderate well-controlled uncomplicated at this visit. We reviewed her medications. No refills were changes are required.

## 2016-08-24 NOTE — Progress Notes (Signed)
[name], female    DOB: 04-17-1929, 83 y.o.   MRN: [id number]  HPI female never smoker followed for asthma complicated by allergic rhinitis, bronchitis, GERD, history atypical AFB, HBP, history brain tumor/adrenal insufficiency, CVA, fall w facial fx and SDH 07/2016 Allergy vaccine- Dr Donneta Romberg Office Spirometry 12/19/2015- WNL-FVC 1.93/82%, FEV1 1.50/86%, FEV1/FVC 0.77, FEF 25-75 percent 1.31/111%. ---------------------------------------------------------------------------  12/19/2015-83 year old female never smoker followed for asthma complicated by allergic rhinitis, bronchitis, GERD, history atypical AFB, history brain tumor/adrenal insufficiency, CVA Has been seeing NP last few visits for her pattern of slowly resolving asthmatic bronchitis exacerbations. FOLLOWS FOR: Pt was stung by yellow jackets 2 weeks ago and would like to know about an Epipen-if needed. Pt states she is also having increase in SOB-unsteady gait as well. Has been feeling weak, more easily short of breath with exertion, unsteady on her feet, especially walking and bending. Albuterol rescue inhaler helps some. No active wheeze or cough. Dr. [name] increased her maintenance prednisone to 10 mg daily. Echocardiogram and nuclear stress test being done by Dr. Gwenlyn Found. She had local reaction to yellow jacket sting on her thigh with no systemic reaction and no past history of insect venom allergy. Office Spirometry 12/19/2015- WNL-FVC 1.93/82%, FEV1 1.50/86%, FEV1/FVC 0.77, FEF 25-75 percent 1.31/111%. CTa chest 10/26/15-  IMPRESSION: No evidence of significant pulmonary embolus. No evidence of active pulmonary disease. Unchanged cystic lesion in the body of the pancreas. Electronically Signed   By: Lucienne Capers M.D.   On: 10/26/2015 01:14  08/24/2016- 83 year old female never smoker followed for asthma complicated by allergic rhinitis, bronchitis, GERD, history atypical AFB, history brain tumor/adrenal  insufficiency, CVA FOLLOWS FOR: Pt states she continues to cough since being seen by SG 07-09-16;denies any wheezing,   Husband here CT head 07/26/2016- SDH , s/p craniotomy w hemorrhagic sinus effusions due to bilat midface fxs- had fallen/ ED visit. CXR 07/23/2016-: No active cardiopulmonary disease. [place] influenza A 06/2016. We discussed her fall and facial injuries. She is still not allowed chew. Still having a little bloody postnasal drip but she is gone back to using Flonase. Not coughing here that has had some cough occasionally at home-nonproductive. Feels her breathing is well controlled. HCTZ was added for mild ankle edema. She continues prednisone 10 mg daily which has significantly stabilized her asthmatic bronchitis.  ROS-see HPI Constitutional:   No-   weight loss, night sweats, fevers, chills, fatigue, lassitude. HEENT:   No-  headaches, difficulty swallowing, tooth/dental problems, sore throat,    itching, ear ache, nasal congestion, + nasal drip,  CV:  No-   chest pain, orthopnea, PND, swelling in lower extremities, anasarca, dizziness, palpitations Resp: +shortness of breath with exertion or at rest.              No- productive cough, no- non-productive cough,  No- coughing up of blood.              +  change in color of mucus.  No- wheezing.   Skin: No-   rash or lesions. GI:  No-   heartburn, indigestion, abdominal pain, nausea, vomiting,  GU:  MS:  No-   joint pain or swelling.  . Neuro-     +dizzy since ophthalmic artery stroke Psych:  No- change in mood or affect. No depression or anxiety.  No memory loss.  OBJ- Physical Exam General- Alert, Oriented, Affect-appropriate, Distress- none acute.  Skin- rash-none, lesions- none, excoriation- none Lymphadenopathy- none Head- atraumatic  Eyes- Gross vision intact, PERRLA, conjunctivae and secretions clear            Ears- Hearing, canals-normal            Nose- Clear, no-Septal dev, mucus, polyps, erosion,  perforation             Throat- Mallampati II , mucosa clear , drainage- none, tonsils- atrophic. Neck- flexible , trachea midline, stridor-none, thyroid nl, carotid no bruit Chest - symmetrical excursion , unlabored           Heart/CV- RRR  , no murmur , no gallop  , no rub, nl s1 s2                           - JVD- none , edema- none, stasis changes- none, varices- none           Lung-  Clear, wheeze-none, cough-none , dullness-none, rub- none,           Chest wall-  Abd-  Br/ Gen/ Rectal- Not done, not indicated Extrem- cyanosis- none, clubbing, none, atrophy- none, strength- nl Neuro- grossly intact to observation. Speech is clear and she is alert and oriented

## 2016-08-24 NOTE — Assessment & Plan Note (Signed)
She had facial bone fractures and blood and maxillary sinuses from her fall. Has follow-up scheduled with oral surgeon. She is able to open her mouth enough to use inhaler if needed. Still draining some bloody material from maxillary sinuses but no active bleeding identified.

## 2016-08-24 NOTE — Patient Instructions (Signed)
We can continue current meds. I'm glad you are recovering from your fall  Please cal if we can help

## 2016-09-23 ENCOUNTER — Ambulatory Visit: Payer: Medicare Other | Admitting: Cardiovascular Disease

## 2016-11-12 DIAGNOSIS — G9601 Cranial cerebrospinal fluid leak, spontaneous: Secondary | ICD-10-CM | POA: Insufficient documentation

## 2016-11-12 DIAGNOSIS — G960 Cerebrospinal fluid leak: Secondary | ICD-10-CM

## 2016-12-17 ENCOUNTER — Ambulatory Visit (INDEPENDENT_AMBULATORY_CARE_PROVIDER_SITE_OTHER): Payer: Medicare Other | Admitting: Neurology

## 2016-12-17 ENCOUNTER — Encounter: Payer: Self-pay | Admitting: Neurology

## 2016-12-17 VITALS — BP 142/88 | HR 92 | Ht 63.5 in | Wt 134.5 lb

## 2016-12-17 DIAGNOSIS — R269 Unspecified abnormalities of gait and mobility: Secondary | ICD-10-CM | POA: Insufficient documentation

## 2016-12-17 DIAGNOSIS — E538 Deficiency of other specified B group vitamins: Secondary | ICD-10-CM

## 2016-12-17 DIAGNOSIS — R413 Other amnesia: Secondary | ICD-10-CM | POA: Diagnosis not present

## 2016-12-17 NOTE — Progress Notes (Signed)
Reason for visit: Memory disturbance  Referring physician: Dr. Rica Mast is a 83 y.o. female  History of present illness:  Sierra [name] is an 83 year old right-handed white female with a history of a meningioma resection from the left parietal-occipital area in 2001. The patient claims that since that time she has had some problems with balance and gait instability. The patient fell on 07/23/2016 and hit her head as she was going into a grocery store. The patient did not sustain loss of consciousness, she did not have any confusion or altered mental status following the head injury. The patient went to the emergency room, CT scan of the brain was done and showed a very small falcine subdural hematoma. No spinal fractures were noted. The patient did have a nasal fracture, she had fractures to the anterior, medial, and posterior lateral maxillary sinus walls bilaterally, left greater than right. Since the fall, the patient believes that her memory has not been at her baseline. She denies any history of memory problems before the fall, she has had some issues since the fall. She also has developed a runny nose that has been persistent, she has been seen by Dr. Polly Cobia to collect the nasal drainage to determine whether or not it represented spinal fluid. She has not been able to collect a sample. The patient continues to have some problem with balance, she believes that this is slightly worse since the fall. The patient has not had any progression of memory, she does not operate a motor vehicle. She has problems with remembering names for people, she may occasionally misplace things about the house. She does not cook, she does not do the finances. She claims that she is able to keep up with her medications although Dr. Oletta Lamas indicates that this has been a problem in the past. The patient denies any headaches, numbness or weakness of the extremities with exception that she feels that her legs  are weak. She denies any issues controlling the bowels or the bladder. She does have a history of ulcerative colitis. She is sent to this office for an evaluation.  Past Medical History:  Diagnosis Date  . Acute asthmatic bronchitis   . Allergic rhinitis   . Arthritis    Hx R shoulder bursitis, pain L knee and L hip  . Asthmatic bronchitis   . Balance problem    SINCE BRAIN TUMOR REMOVED IN 2002-BENIGN TUMOR-PT HAS ADRENAL INSUFFICIENCY AND TAKES DAILY PREDNISONE  . Blood transfusion   . Cardiac murmur    DOES NOT CAUSE ANY SYMPTOMS  . Chronic kidney disease   . Chronic pharyngitis   . Diverticulitis, colon   . Dizziness   . DOE (dyspnea on exertion)   . Esophageal reflux   . GI bleed 01/06/2013  . Hyperlipemia   . Hypertension   . Hypothyroidism   . IBS (irritable bowel syndrome)   . Leukocytosis   . Other specified iron deficiency anemias   . Recurrent upper respiratory infection (URI) 07/10/2011    ACUTE BRONCHITIS - extra prednisone in addition to the daily prednisone and a Z-Pak  . Thrombocytosis ([place])   . Ulceration, colon   . Ulcerative colitis     Past Surgical History:  Procedure Laterality Date  . ABDOMINAL HYSTERECTOMY  1985  . APPENDECTOMY    . COLONOSCOPY N/A 01/05/2013   Procedure: COLONOSCOPY;  Surgeon: Cleotis Nipper, MD;  Location: [facility] ENDOSCOPY;  Service: Endoscopy;  Laterality: N/A;  . COLONOSCOPY  N/A 01/04/2014   Procedure: COLONOSCOPY;  Surgeon: Winfield Cunas., MD;  Location: [facility] ENDOSCOPY;  Service: Endoscopy;  Laterality: N/A;  . CRANIOTOMY    . DILATION AND CURETTAGE OF UTERUS  1967  . ESOPHAGOGASTRODUODENOSCOPY N/A 01/05/2013   Procedure: ESOPHAGOGASTRODUODENOSCOPY (EGD);  Surgeon: Cleotis Nipper, MD;  Location: [facility] ENDOSCOPY;  Service: Endoscopy;  Laterality: N/A;  . ESOPHAGOGASTRODUODENOSCOPY N/A 06/26/2014   Procedure: ESOPHAGOGASTRODUODENOSCOPY (EGD);  Surgeon: Winfield Cunas., MD;  Location: [facility] ENDOSCOPY;  Service: Endoscopy;   Laterality: N/A;  . EYE SURGERY     2003 -RIGHT CATARACT EXTRACTED AND LEFT WAS DONE IN 2004  . FLEXIBLE SIGMOIDOSCOPY N/A 06/26/2014   Procedure: FLEXIBLE SIGMOIDOSCOPY;  Surgeon: Winfield Cunas., MD;  Location: [facility] ENDOSCOPY;  Service: Endoscopy;  Laterality: N/A;  . FRONTALIS SUSPENSION  10-09-2010   lifting eyelids AND SECOND EYE SURGERY November 19, 2010  . meningioma resected  20O2  . PARTIAL COLECTOMY  2008  . subglottal mucocoel  2000  . THYROIDECTOMY  1986  . TONSILLECTOMY  1938  . TOTAL HIP ARTHROPLASTY  OCT 2006   right  . TOTAL HIP ARTHROPLASTY  07/27/2011   Procedure: TOTAL HIP ARTHROPLASTY;  Surgeon: Gearlean Alf, MD;  Location: [facility] ORS;  Service: Orthopedics;  Laterality: Left;  Marland Kitchen VESICOVAGINAL FISTULA CLOSURE W/ TAH    . WRIST TENDON LESION REMOVED 2007      Family History  Problem Relation Age of Onset  . Heart attack Father        deceased    Social history:  reports that she has never smoked. She has never used smokeless tobacco. She reports that she drinks alcohol. She reports that she does not use drugs.  Medications:  Prior to Admission medications   Medication Sig Start Date End Date Taking? Authorizing Provider  acetaminophen (TYLENOL) 325 MG tablet Take 325-650 mg by mouth every 6 (six) hours as needed (for headaches or pain).   Yes [provider]  albuterol (PROVENTIL HFA;VENTOLIN HFA) 108 (90 Base) MCG/ACT inhaler Inhale 2 puffs into the lungs every 6 (six) hours as needed for wheezing or shortness of breath. 06/12/16 10/02/20 Yes Young, Tarri Fuller D, MD  albuterol (PROVENTIL) (2.5 MG/3ML) 0.083% nebulizer solution Take 3 mLs (2.5 mg total) by nebulization every 6 (six) hours as needed for wheezing or shortness of breath. 06/24/16  Yes Young, Tarri Fuller D, MD  Artificial Tear Solution (BION TEARS) 0.1-0.3 % SOLN Place 1 drop into both eyes every morning.    Yes [provider]  Artificial Tear Solution (GENTEAL TEARS) 0.1-0.2-0.3 % SOLN Place 1  drop into both eyes at bedtime.   Yes [provider]  B Complex Vitamins (VITAMIN B COMPLEX PO) Take 1 tablet by mouth daily.   Yes [provider]  BIOTIN PO Take 1 tablet by mouth daily.   Yes [provider]  budesonide-formoterol (SYMBICORT) 160-4.5 MCG/ACT inhaler Inhale 2 puffs into the lungs 2 (two) times daily. 06/30/16  Yes Parrett, Tammy S, NP  cholecalciferol (VITAMIN D) 1000 UNITS tablet Take 1,000 Units by mouth daily.   Yes [provider]  Cyanocobalamin (VITAMIN B-12) 2500 MCG SUBL Take 2,500 mcg by mouth daily.    Yes [provider]  lactase (LACTAID) 3000 UNITS tablet Take 3,000 Units by mouth daily as needed (lactose intolerant).   Yes [provider]  levothyroxine (SYNTHROID, LEVOTHROID) 75 MCG tablet Take 75 mcg by mouth daily before breakfast.    Yes [provider]  Multiple Vitamin (MULTIVITAMIN) tablet Take 1 tablet by mouth daily.   Yes [provider]  NON FORMULARY Flutter device   Yes [provider]  omeprazole (PRILOSEC) 20 MG capsule Take 20 mg by mouth as needed.   Yes [provider]  predniSONE (DELTASONE) 10 MG tablet Take 10 mg by mouth daily with breakfast.   Yes [provider]  Probiotic Product (PROBIOTIC DAILY PO) Take 1 capsule by mouth daily.    Yes [provider]  ranitidine (ZANTAC) 150 MG tablet Take 150 mg by mouth 2 (two) times daily.    Yes [provider]  amLODipine (NORVASC) 2.5 MG tablet Take 2.5 mg by mouth daily.    [provider]  hydrochlorothiazide (MICROZIDE) 12.5 MG capsule Take 1 capsule (12.5 mg total) by mouth daily. 07/13/16 10/11/16  Erlene Quan, PA-C  LORazepam (ATIVAN) 0.5 MG tablet Take 1 tablet by mouth at bedtime as needed for sleep.  06/16/16   [provider]      Allergies  Allergen Reactions  . Sulfamethoxazole Nausea And Vomiting  . Tape Other (See Comments)    TAPE TEARS AND BRUISES  THE SKIN!!  . Biaxin [Clarithromycin] Other (See Comments)    Bitter taste  . Ciprofloxacin Rash  . Codeine Nausea And Vomiting  . Dilantin [Phenytoin] Rash  . Doxycycline Nausea Only    Very nauseated  . Lactose Intolerance (Gi) Other (See Comments)    gas  . Restasis [Cyclosporine] Other (See Comments) and Swelling    Eyes burn   . Sulfonamide Derivatives Rash    ROS:  Out of a complete 14 system review of symptoms, the patient complains only of the following symptoms, and all other reviewed systems are negative.  Memory disturbance Balance problems  Blood pressure (!) 142/88, pulse 92, height 5' 3.5" (1.613 m), weight 134 lb 8 oz (61 kg).  Physical Exam  General: The patient is alert and cooperative at the time of the examination.  Eyes: Pupils are equal, round, and reactive to light. Discs are flat bilaterally.  Neck: The neck is supple, no carotid bruits are noted.  Respiratory: The respiratory examination is clear.  Cardiovascular: The cardiovascular examination reveals a regular rate and rhythm, no obvious murmurs or rubs are noted.  Skin: Extremities are without significant edema.  Neurologic Exam  Mental status: The patient is alert and oriented x 3 at the time of the examination. The patient has apparent normal recent and remote memory, with an apparently normal attention span and concentration ability. Mini-Mental Status Examination done today shows a total score of 28/30.  Cranial nerves: Facial symmetry is present. There is good sensation of the face to pinprick and soft touch bilaterally. The strength of the facial muscles and the muscles to head turning and shoulder shrug are normal bilaterally. Speech is well enunciated, no aphasia or dysarthria is noted. Extraocular movements are full. Visual fields are full. The tongue is midline, and the patient has symmetric elevation of the soft palate. No obvious hearing deficits are noted.  Motor: The motor testing  reveals 5 over 5 strength of all 4 extremities. Good symmetric motor tone is noted throughout.  Sensory: Sensory testing is intact to pinprick, soft touch, vibration sensation, and position sense on all 4 extremities, with exception of some mild impairment of position sense in both feet. No evidence of extinction is noted.  Coordination: Cerebellar testing reveals good finger-nose-finger and heel-to-shin bilaterally.  Gait and station: Gait is slightly wide-based,  unsteady. The patient does not use an assistive device to walk. The patient is unable to perform tandem gait. Romberg is negative. No drift is seen.  Reflexes: Deep tendon reflexes are symmetric and normal bilaterally. The ankle jerk reflexes are well-maintained bilaterally. Toes are downgoing bilaterally.   CT head 07/26/16:  IMPRESSION: Stable in size left parafalcine subdural hematoma, measuring 3 mm in maximum thickness.  Stable brain parenchymal volume loss and periventricular microangiopathy.  Stable postsurgical changes with encephalomalacia of the left occipital lobe.  Hemorrhagic effusions of bilateral maxillary sinuses, likely due to known bilateral mid face fractures.  * CT scan images were reviewed online. I agree with the written report.    Assessment/Plan:  1. Mild memory disturbance  2. Gait disturbance  3. Fall with head injury in March 2018  The patient has noted a change in balance and memory since the fall, she does not report symptoms typical of a significant concussion with no report of confusion or disorientation following the fall. The patient will be set up for MRI evaluation of the brain, she will have blood work done today. She will follow-up in 6 months to follow the memory issue, if the memory progresses, addition of medication such as Aricept will be done. She will need to collect a sample of her nasal drainage to exclude the possibility of a spinal fluid leak.  Daughter is at [employer], telephone number [phone].  Son is Sierra [name], telephone number is [phone].  Jill Alexanders MD 12/17/2016 9:23 AM  [facility] [address] [place], [place] [address]  Phone [phone] Fax [phone]

## 2016-12-17 NOTE — Patient Instructions (Signed)
   We will check blood work today and get MRI of the brain. 

## 2016-12-19 LAB — COPPER, SERUM: COPPER: 78 ug/dL (ref 72–166)

## 2016-12-19 LAB — SEDIMENTATION RATE: SED RATE: 10 mm/h (ref 0–40)

## 2016-12-19 LAB — VITAMIN B12: Vitamin B-12: >2000 pg/mL — ABNORMAL HIGH (ref 232–1245)

## 2016-12-19 LAB — RPR: RPR Ser Ql: NONREACTIVE

## 2017-01-26 ENCOUNTER — Encounter (HOSPITAL_COMMUNITY): Payer: Self-pay

## 2017-01-26 ENCOUNTER — Encounter: Payer: Self-pay | Admitting: Cardiovascular Disease

## 2017-01-26 ENCOUNTER — Emergency Department (HOSPITAL_COMMUNITY): Payer: Medicare Other

## 2017-01-26 ENCOUNTER — Ambulatory Visit (INDEPENDENT_AMBULATORY_CARE_PROVIDER_SITE_OTHER): Payer: Medicare Other | Admitting: Cardiovascular Disease

## 2017-01-26 ENCOUNTER — Emergency Department (HOSPITAL_COMMUNITY)
Admission: EM | Admit: 2017-01-26 | Discharge: 2017-01-26 | Disposition: A | Discharge status: Home / Self Care | Payer: Medicare Other | Attending: Emergency Medicine | Admitting: Emergency Medicine

## 2017-01-26 VITALS — BP 204/93 | HR 87 | Ht 63.0 in | Wt 134.6 lb

## 2017-01-26 DIAGNOSIS — N182 Chronic kidney disease, stage 2 (mild): Secondary | ICD-10-CM | POA: Diagnosis not present

## 2017-01-26 DIAGNOSIS — Y929 Unspecified place or not applicable: Secondary | ICD-10-CM | POA: Diagnosis not present

## 2017-01-26 DIAGNOSIS — Y939 Activity, unspecified: Secondary | ICD-10-CM | POA: Insufficient documentation

## 2017-01-26 DIAGNOSIS — J45909 Unspecified asthma, uncomplicated: Secondary | ICD-10-CM | POA: Insufficient documentation

## 2017-01-26 DIAGNOSIS — W010XXA Fall on same level from slipping, tripping and stumbling without subsequent striking against object, initial encounter: Secondary | ICD-10-CM | POA: Diagnosis not present

## 2017-01-26 DIAGNOSIS — Z885 Allergy status to narcotic agent status: Secondary | ICD-10-CM | POA: Insufficient documentation

## 2017-01-26 DIAGNOSIS — E039 Hypothyroidism, unspecified: Secondary | ICD-10-CM | POA: Diagnosis not present

## 2017-01-26 DIAGNOSIS — I129 Hypertensive chronic kidney disease with stage 1 through stage 4 chronic kidney disease, or unspecified chronic kidney disease: Secondary | ICD-10-CM | POA: Insufficient documentation

## 2017-01-26 DIAGNOSIS — Y999 Unspecified external cause status: Secondary | ICD-10-CM | POA: Diagnosis not present

## 2017-01-26 DIAGNOSIS — Z79899 Other long term (current) drug therapy: Secondary | ICD-10-CM | POA: Diagnosis not present

## 2017-01-26 DIAGNOSIS — S42292A Other displaced fracture of upper end of left humerus, initial encounter for closed fracture: Secondary | ICD-10-CM | POA: Diagnosis not present

## 2017-01-26 DIAGNOSIS — I10 Essential (primary) hypertension: Secondary | ICD-10-CM | POA: Diagnosis not present

## 2017-01-26 DIAGNOSIS — M25512 Pain in left shoulder: Secondary | ICD-10-CM | POA: Diagnosis present

## 2017-01-26 LAB — COMPREHENSIVE METABOLIC PANEL
ALBUMIN: 3.4 g/dL — AB (ref 3.5–5.0)
ALK PHOS: 39 U/L (ref 38–126)
ALT: 19 U/L (ref 14–54)
AST: 24 U/L (ref 15–41)
Anion gap: 12 (ref 5–15)
BILIRUBIN TOTAL: 0.5 mg/dL (ref 0.3–1.2)
BUN: 20 mg/dL (ref 6–20)
CO2: 23 mmol/L (ref 22–32)
CREATININE: 0.73 mg/dL (ref 0.44–1.00)
Calcium: 8.3 mg/dL — ABNORMAL LOW (ref 8.9–10.3)
Chloride: 103 mmol/L (ref 101–111)
GFR calc Af Amer: >60 mL/min (ref >60)
GLUCOSE: 171 mg/dL — AB (ref 65–99)
POTASSIUM: 3.2 mmol/L — AB (ref 3.5–5.1)
Sodium: 138 mmol/L (ref 135–145)
TOTAL PROTEIN: 6.4 g/dL — AB (ref 6.5–8.1)

## 2017-01-26 LAB — CBC WITH DIFFERENTIAL/PLATELET
BASOS ABS: 0 10*3/uL (ref 0.0–0.1)
BASOS PCT: 0 %
Eosinophils Absolute: 0 10*3/uL (ref 0.0–0.7)
Eosinophils Relative: 0 %
HEMATOCRIT: 34.7 % — AB (ref 36.0–46.0)
HEMOGLOBIN: 11.5 g/dL — AB (ref 12.0–15.0)
LYMPHS PCT: 9 %
Lymphs Abs: 1.3 10*3/uL (ref 0.7–4.0)
MCH: 28.5 pg (ref 26.0–34.0)
MCHC: 33.1 g/dL (ref 30.0–36.0)
MCV: 86.1 fL (ref 78.0–100.0)
MONO ABS: 1 10*3/uL (ref 0.1–1.0)
Monocytes Relative: 7 %
NEUTROS ABS: 11.8 10*3/uL — AB (ref 1.7–7.7)
NEUTROS PCT: 84 %
Platelets: 280 10*3/uL (ref 150–400)
RBC: 4.03 MIL/uL (ref 3.87–5.11)
RDW: 15.1 % (ref 11.5–15.5)
WBC: 14.1 10*3/uL — AB (ref 4.0–10.5)

## 2017-01-26 LAB — CBG MONITORING, ED: GLUCOSE-CAPILLARY: 187 mg/dL — AB (ref 65–99)

## 2017-01-26 MED ORDER — PROPOFOL 10 MG/ML IV BOLUS
100.0000 mg | Freq: Once | INTRAVENOUS | Status: AC
  Administered 2017-01-26: 100 mg via INTRAVENOUS
  Filled 2017-01-26: qty 20

## 2017-01-26 MED ORDER — FENTANYL CITRATE (PF) 100 MCG/2ML IJ SOLN
50.0000 ug | Freq: Once | INTRAMUSCULAR | Status: AC
  Administered 2017-01-26: 50 ug via INTRAVENOUS
  Filled 2017-01-26: qty 2

## 2017-01-26 MED ORDER — ONDANSETRON HCL 4 MG/2ML IJ SOLN
4.0000 mg | Freq: Once | INTRAMUSCULAR | Status: AC
  Administered 2017-01-26: 4 mg via INTRAVENOUS
  Filled 2017-01-26: qty 2

## 2017-01-26 MED ORDER — HYDROCODONE-ACETAMINOPHEN 5-325 MG PO TABS: 1.0000 | ORAL_TABLET | ORAL | 0 refills | Status: DC | PRN

## 2017-01-26 MED ORDER — MORPHINE SULFATE (PF) 4 MG/ML IV SOLN
4.0000 mg | INTRAVENOUS | Status: DC | PRN
  Administered 2017-01-26: 4 mg via INTRAVENOUS
  Filled 2017-01-26: qty 1

## 2017-01-26 MED ORDER — PROPOFOL 10 MG/ML IV BOLUS
INTRAVENOUS | Status: AC | PRN
  Administered 2017-01-26: 12.5 mg via INTRAVENOUS

## 2017-01-26 NOTE — Assessment & Plan Note (Signed)
History of hyperlipidemia not on statin therapy followed by her PCP 

## 2017-01-26 NOTE — ED Notes (Signed)
Bed: IZ12 Expected date:  Expected time:  Means of arrival:  Comments: EMS- 83yo F, fall/shoulder deformity

## 2017-01-26 NOTE — Progress Notes (Signed)
01/26/2017 Sierra [name]   12/18/1928  [id number]  Primary Physician Burnard Bunting, MD Primary Cardiologist: Lorretta Harp MD Lupe Carney, [place]  HPI:  Sierra [name] is a 83 y.o. female  thin-appearing married Caucasian female mother of 2, grandmother of 90 grandchildren who is accompanied by her husband today was also is patient of mine. She was referred by Dr. Dr. [name] for cardiovascular evaluation because of new-onset dyspnea on exertion. I last saw her in the office 07/28/16. She has a history of hypertension and hyperlipidemia both treated. Her father died of a myocardial infarction at age 30. She has reactive airways disease treated by Dr. Annamaria Boots. She had a benign meningioma surgically removed with forced [facility] approximately 15 years ago. They moved into a retirement community recently and she's noticed increasing dyspnea over the last 2 months. She denies chest pain. A 2-D echocardiogram performed 10/05/11 was normal. She had a 2-D echocardiogram and a Myoview stress test both of which were normal. Unfortunately, she tripped and fell several weeks ago and hit her face on the ground resulting in significant facial trauma although noninvasive evaluation was not concerning. Since I saw her 6 months ago she's remained stable. She does have some unsteadiness of gait with major complaints of continued dyspnea with a negative cardiac workup.   Current Meds  Medication Sig  . acetaminophen (TYLENOL) 325 MG tablet Take 325-650 mg by mouth every 6 (six) hours as needed (for headaches or pain).  Marland Kitchen albuterol (PROVENTIL HFA;VENTOLIN HFA) 108 (90 Base) MCG/ACT inhaler Inhale 2 puffs into the lungs every 6 (six) hours as needed for wheezing or shortness of breath.  Marland Kitchen amLODipine (NORVASC) 2.5 MG tablet Take 2.5 mg by mouth daily.  . Artificial Tear Solution (BION TEARS) 0.1-0.3 % SOLN Place 1 drop into both eyes every morning.   . Artificial Tear Solution (GENTEAL TEARS)  0.1-0.2-0.3 % SOLN Place 1 drop into both eyes at bedtime.  . B Complex Vitamins (VITAMIN B COMPLEX PO) Take 1 tablet by mouth daily.  Marland Kitchen BIOTIN PO Take 1 tablet by mouth daily.  . cholecalciferol (VITAMIN D) 1000 UNITS tablet Take 1,000 Units by mouth daily.  . Cyanocobalamin (VITAMIN B-12) 2500 MCG SUBL Take 2,500 mcg by mouth daily.   Marland Kitchen lactase (LACTAID) 3000 UNITS tablet Take 3,000 Units by mouth daily as needed (lactose intolerant).  Marland Kitchen levothyroxine (SYNTHROID, LEVOTHROID) 75 MCG tablet Take 75 mcg by mouth daily before breakfast.   . LORazepam (ATIVAN) 0.5 MG tablet Take 1 tablet by mouth at bedtime as needed for sleep.   . Multiple Vitamin (MULTIVITAMIN) tablet Take 1 tablet by mouth daily.  . predniSONE (DELTASONE) 10 MG tablet Take 10 mg by mouth daily with breakfast.  . Probiotic Product (PROBIOTIC DAILY PO) Take 1 capsule by mouth daily.   . ranitidine (ZANTAC) 150 MG tablet Take 150 mg by mouth 2 (two) times daily.   . [DISCONTINUED] budesonide-formoterol (SYMBICORT) 160-4.5 MCG/ACT inhaler Inhale 2 puffs into the lungs 2 (two) times daily.  . [DISCONTINUED] NON FORMULARY Flutter device  . [DISCONTINUED] omeprazole (PRILOSEC) 20 MG capsule Take 20 mg by mouth as needed.     Allergies  Allergen Reactions  . Sulfamethoxazole Nausea And Vomiting  . Tape Other (See Comments)    TAPE TEARS AND BRUISES THE SKIN!!  . Biaxin [Clarithromycin] Other (See Comments)    Bitter taste  . Ciprofloxacin Rash  . Codeine Nausea And Vomiting  . Dilantin [Phenytoin] Rash  .  Doxycycline Nausea Only    Very nauseated  . Lactose Intolerance (Gi) Other (See Comments)    gas  . Restasis [Cyclosporine] Other (See Comments) and Swelling    Eyes burn   . Sulfonamide Derivatives Rash    Social History   Social History  . Marital status: Married    Spouse name: [place]  . Number of children: 2  . Years of education: N/A   Occupational History  . Retired Loss adjuster, chartered, [place] History Main Topics  . Smoking status: Never Smoker  . Smokeless tobacco: Never Used  . Alcohol use Yes     Comment: occ glass of wine  . Drug use: No  . Sexual activity: No   Other Topics Concern  . Not on file   Social History Narrative   Retd Education officer, 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  Cardiovascular: negative for chest pain, dyspnea on exertion, edema, orthopnea, palpitations, paroxysmal nocturnal dyspnea or shortness of breath Dermatological: negative for rash Respiratory: negative for cough or wheezing Urologic: negative for hematuria Abdominal: negative for nausea, vomiting, diarrhea, bright red blood per rectum, melena, or hematemesis Neurologic: negative for visual changes, syncope, or dizziness All other systems reviewed and are otherwise negative except as noted above.    Blood pressure (!) 204/93, pulse 87, height 5\' 3"  (1.6 m), weight 134 lb 9.6 oz (61.1 kg).  General appearance: alert and no distress Neck: no adenopathy, no carotid bruit, no JVD, supple, symmetrical, trachea midline and thyroid not enlarged, symmetric, no tenderness/mass/nodules Lungs: clear to auscultation bilaterally Heart: regular rate and rhythm, S1, S2 normal, no murmur, click, rub or gallop Extremities: extremities normal, atraumatic, no cyanosis or edema Pulses: 2+ and symmetric Skin: Skin color, texture, turgor normal. No rashes or lesions Neurologic: Alert and oriented X 3, normal strength and tone. Normal symmetric reflexes. Normal coordination and gait  EKG sinus rhythm at 87 with ST or T-wave changes. I personally reviewed this EKG.  ASSESSMENT AND PLAN:   HYPERLIPIDEMIA History of hyperlipidemia not on statin therapy followed by her PCP  Essential hypertension History of essential hypertension  with blood pressure measured at 200/93. Prior readings have been fairly normal. She is on hydrochlorothiazide. Continue current meds at current dosing  DYSPNEA ON EXERTION History of dyspnea on exertion with a normal 2-D echo except for grade 1 diastolic dysfunction and a negative Myoview. She is being evaluated by Dr. Annamaria Boots for pulmonary etiology.      Lorretta Harp MD FACP,FACC,FAHA, [facility] 01/26/2017 11:51 AM

## 2017-01-26 NOTE — Patient Instructions (Signed)
Medication Instructions: Your physician recommends that you continue on your current medications as directed. Please refer to the Current Medication list given to you today.   Follow-Up: Your physician recommends that you schedule a follow-up appointment as needed with Dr. Berry.    

## 2017-01-26 NOTE — Assessment & Plan Note (Signed)
History of essential hypertension with blood pressure measured at 200/93. Prior readings have been fairly normal. She is on hydrochlorothiazide. Continue current meds at current dosing

## 2017-01-26 NOTE — Assessment & Plan Note (Signed)
History of dyspnea on exertion with a normal 2-D echo except for grade 1 diastolic dysfunction and a negative Myoview. She is being evaluated by Dr. Annamaria Boots for pulmonary etiology.

## 2017-01-26 NOTE — ED Triage Notes (Signed)
Patient presented to ed by ems with c/o fall. Patient was outside gardening and she loss her balance and she put her hands down to held her self. Pt have left shoulder deformities. Pt was given 200 mcg of fentanyl by ems.

## 2017-01-26 NOTE — Discharge Instructions (Addendum)
Call Dr. Erlinda Hong Alusiofor appointment this week. [place] for pain. Can cause drowsiness.

## 2017-01-27 ENCOUNTER — Telehealth: Payer: Self-pay | Admitting: Emergency Medicine

## 2017-01-27 NOTE — Care Management Note (Signed)
Case Management Note  Patient Details  Name: Sierra [name] MRN: [id number] Date of Birth: 11/30/1928  Subjective/Objective:  83 year old female with hx of falls, falling today with left shoulder deformity.                Action/Plan: CM consulted for HHS, RN, PT, OT.  CM noted pt has used [facility] in the past.  Called and spoke with pt, [phone], who stated she would like to use them again.  Kim with [facility] accepted pt.  Information available faxed to [facility].  Updated pt.  No further CM needs noted at this time.  Expected Discharge Date:    01/26/2017              Expected Discharge Plan:  [place]  Discharge planning Services  CM Consult  Post Acute Care Choice:  Home Health Choice offered to:  Patient  HH Arranged:  RN, PT, OT [facility] Agency:  [facility]  Status of Service:  Completed, signed off  Corky Crafts, RN 01/27/2017, 12:01 PM

## 2017-01-27 NOTE — Telephone Encounter (Signed)
CM consulted for HHS post D/C from the ED.  Called pt who stated she had used [facility] in the past and would like to use them again.  Called pt back with update that [facility] had been able to accept her, spoke with pt's spouse who will tell the pt.  No further CM needs noted at this time.

## 2017-02-01 ENCOUNTER — Ambulatory Visit (INDEPENDENT_AMBULATORY_CARE_PROVIDER_SITE_OTHER): Payer: Medicare Other | Admitting: Osteopathic Medicine

## 2017-02-01 ENCOUNTER — Ambulatory Visit: Payer: Medicare Other | Admitting: Physician Assistant

## 2017-02-01 ENCOUNTER — Encounter: Payer: Self-pay | Admitting: Osteopathic Medicine

## 2017-02-01 VITALS — BP 146/81 | HR 101 | Wt 133.0 lb

## 2017-02-01 DIAGNOSIS — K5720 Diverticulitis of large intestine with perforation and abscess without bleeding: Secondary | ICD-10-CM

## 2017-02-01 DIAGNOSIS — F419 Anxiety disorder, unspecified: Secondary | ICD-10-CM

## 2017-02-01 DIAGNOSIS — K5732 Diverticulitis of large intestine without perforation or abscess without bleeding: Secondary | ICD-10-CM | POA: Diagnosis not present

## 2017-02-01 DIAGNOSIS — E890 Postprocedural hypothyroidism: Secondary | ICD-10-CM | POA: Diagnosis not present

## 2017-02-01 DIAGNOSIS — Z87898 Personal history of other specified conditions: Secondary | ICD-10-CM

## 2017-02-01 DIAGNOSIS — I10 Essential (primary) hypertension: Secondary | ICD-10-CM

## 2017-02-01 DIAGNOSIS — Z23 Encounter for immunization: Secondary | ICD-10-CM | POA: Diagnosis not present

## 2017-02-01 DIAGNOSIS — R413 Other amnesia: Secondary | ICD-10-CM

## 2017-02-01 DIAGNOSIS — D649 Anemia, unspecified: Secondary | ICD-10-CM | POA: Diagnosis not present

## 2017-02-01 DIAGNOSIS — F329 Major depressive disorder, single episode, unspecified: Secondary | ICD-10-CM

## 2017-02-01 DIAGNOSIS — E2740 Unspecified adrenocortical insufficiency: Secondary | ICD-10-CM | POA: Diagnosis not present

## 2017-02-01 DIAGNOSIS — S42215A Unspecified nondisplaced fracture of surgical neck of left humerus, initial encounter for closed fracture: Secondary | ICD-10-CM

## 2017-02-01 DIAGNOSIS — E538 Deficiency of other specified B group vitamins: Secondary | ICD-10-CM

## 2017-02-01 DIAGNOSIS — D329 Benign neoplasm of meninges, unspecified: Secondary | ICD-10-CM

## 2017-02-01 DIAGNOSIS — J4520 Mild intermittent asthma, uncomplicated: Secondary | ICD-10-CM

## 2017-02-01 DIAGNOSIS — K51918 Ulcerative colitis, unspecified with other complication: Secondary | ICD-10-CM | POA: Diagnosis not present

## 2017-02-01 DIAGNOSIS — J300 Vasomotor rhinitis: Secondary | ICD-10-CM | POA: Diagnosis not present

## 2017-02-01 DIAGNOSIS — R32 Unspecified urinary incontinence: Secondary | ICD-10-CM

## 2017-02-01 DIAGNOSIS — F418 Other specified anxiety disorders: Secondary | ICD-10-CM

## 2017-02-01 HISTORY — DX: Diverticulitis of large intestine with perforation and abscess without bleeding: K57.20

## 2017-02-01 MED ORDER — ESCITALOPRAM OXALATE 5 MG PO TABS: 5.0000 mg | ORAL_TABLET | Freq: Every day | ORAL | 1 refills | Status: DC

## 2017-02-01 NOTE — Progress Notes (Signed)
HPI: Sierra [name] is a 83 y.o. female  who presents to [facility] today, 02/01/17,  for chief complaint of:  Chief Complaint  Patient presents with  . Establish Care    DISCUSS ANXIETY AND DEPRESSION    New patient here to establish care. Accompanied by daughter. No major concerns today "just wanted to get everything straightened out, she seems to have a lot going on and we have some questions."   CARDIOVASCULAR~ HTN - amlodipine 10, HCTZ 12.5 Since starting HCTZ, has noted that she is a bit more dizzy than usual.  RESPIRATORY Asthma/chronic bronchitis - albuterol Hx dyspnea on exertion   Pulmonologist: Dr Annamaria Boots - has not followed up with this person in some time. Chronic productive cough, previous sputum culture from hospitalizations appeared negative.  NEUROLOGICAL/PSYCH Hx anxiety - Ativan prn sleep is on medication list but this really isn't anything that she takes on a regular Hx meningioma Hx CSF leakage since fall - hx broken facial bones - review ENT notes, sounds like based on their review of the CT of the facial bones CSF rhinorrhea seems unlikely, they were unable to collect adequate sample for analysis Would like to discuss depression and anxiety today - has noticed lack of motivation, significant irritability which is causing some issues as well with her and her husband.  RENAL Hx CKD  GASTROINTESTINAL Hx diverticular disease with previous diverticulitis and GI bleed  S/p colectomy  Hx ulcerative colitis and IBS   GERD Diverticular disease Ranitidine 150 mg  Lialda  Probiotics  Lactase Following with GI Dr Oletta Lamas   ENDOCRINE Hx adrenal insufficiency on problem list - Prednisone 10 mg daily. S/p brain surgery for meningioma repair Reports Hx prediabetes  - previous A1C 6.4%  Hypothyroidism - levothyroxine 75 mcg  - TSH checked awhile ago, removal of goiter years ago   MUSCULOSKELETAL/RHEUM Tylenol Vitamin  D  HEENT Hx chronic hoarseness Hx nonallergic vasomotor rhinitis  Hx CSF rhinorrhea on problem list, looks like she was referred to ENT - saw Dr Erik Obey [id number]/1517 - TC not likely for CSF leak and never able to get sufficient fluid for analysis by NP at her previous PCP 10/2016. Fall and dizziness problems since then, looks like a different PCP in 08/2016 referral to oromaxillary surgery   HEM/ONC Hx Anemia  Other Taking B12, Biotin, B complex, artificial tears    Past medical, surgical, social and family history reviewed: Patient Active Problem List   Diagnosis Date Noted  . Memory loss 12/17/2016  . Gait abnormality 12/17/2016  . Edema extremities 07/13/2016  . Influenza due to identified novel influenza A virus with other respiratory manifestations 06/11/2016  . Influenza A with respiratory manifestations 06/11/2016  . Dyspnea on exertion 12/04/2015  . Anemia 12/12/2014  . Anxiety 12/12/2014  . Asthma 12/12/2014  . Chronic kidney disease, stage II (mild) 12/12/2014  . H/O: GI bleed 12/12/2014  . Postural lightheadedness 12/12/2014  . Adrenal insufficiency ([place]) 01/03/2014  . Ulcerative colitis, chronic ([place]) 01/03/2014  . Acute blood loss anemia 01/06/2013  . Malaise and fatigue 09/21/2011  . Atrial premature contractions 09/21/2011  . Osteoarthritis of hip 07/27/2011  . DYSPNEA ON EXERTION 05/30/2010  . HYPOTHYROIDISM 05/01/2010  . Ulcerative colitis ([place]) 03/02/2010  . HOARSENESS, CHRONIC 11/05/2009  . Asthma with bronchitis 04/30/2009  . HYPERLIPIDEMIA 07/11/2007  . ANEMIA, IRON DEFICIENCY, MICROCYTIC 07/11/2007  . LEUKOCYTOSIS 07/11/2007  . THROMBOCYTOSIS 07/11/2007  . Essential hypertension 07/11/2007  . PHARYNGITIS, CHRONIC 07/11/2007  .  Nonallergic vasomotor rhinitis 07/11/2007  . Esophageal reflux 07/11/2007  . DIVERTICULITIS, COLON 07/11/2007  . ARTHRITIS 07/11/2007  . CARDIAC MURMUR 07/11/2007  . Diverticulitis of colon 07/11/2007   Past Surgical  History:  Procedure Laterality Date  . ABDOMINAL HYSTERECTOMY  1985  . APPENDECTOMY    . COLONOSCOPY N/A 01/05/2013   Procedure: COLONOSCOPY;  Surgeon: Cleotis Nipper, MD;  Location: [facility] ENDOSCOPY;  Service: Endoscopy;  Laterality: N/A;  . COLONOSCOPY N/A 01/04/2014   Procedure: COLONOSCOPY;  Surgeon: Winfield Cunas., MD;  Location: [facility] ENDOSCOPY;  Service: Endoscopy;  Laterality: N/A;  . CRANIOTOMY    . DILATION AND CURETTAGE OF UTERUS  1967  . ESOPHAGOGASTRODUODENOSCOPY N/A 01/05/2013   Procedure: ESOPHAGOGASTRODUODENOSCOPY (EGD);  Surgeon: Cleotis Nipper, MD;  Location: [facility] ENDOSCOPY;  Service: Endoscopy;  Laterality: N/A;  . ESOPHAGOGASTRODUODENOSCOPY N/A 06/26/2014   Procedure: ESOPHAGOGASTRODUODENOSCOPY (EGD);  Surgeon: Winfield Cunas., MD;  Location: [facility] ENDOSCOPY;  Service: Endoscopy;  Laterality: N/A;  . EYE SURGERY     2003 -RIGHT CATARACT EXTRACTED AND LEFT WAS DONE IN 2004  . FLEXIBLE SIGMOIDOSCOPY N/A 06/26/2014   Procedure: FLEXIBLE SIGMOIDOSCOPY;  Surgeon: Winfield Cunas., MD;  Location: [facility] ENDOSCOPY;  Service: Endoscopy;  Laterality: N/A;  . FRONTALIS SUSPENSION  10-09-2010   lifting eyelids AND SECOND EYE SURGERY November 19, 2010  . meningioma resected  20O2  . PARTIAL COLECTOMY  2008  . subglottal mucocoel  2000  . THYROIDECTOMY  1986  . TONSILLECTOMY  1938  . TOTAL HIP ARTHROPLASTY  OCT 2006   right  . TOTAL HIP ARTHROPLASTY  07/27/2011   Procedure: TOTAL HIP ARTHROPLASTY;  Surgeon: Gearlean Alf, MD;  Location: [facility] ORS;  Service: Orthopedics;  Laterality: Left;  Marland Kitchen VESICOVAGINAL FISTULA CLOSURE W/ TAH    . WRIST TENDON LESION REMOVED 2007     Social History  Substance Use Topics  . Smoking status: Never Smoker  . Smokeless tobacco: Never Used  . Alcohol use Yes     Comment: occ glass of wine   Family History  Problem Relation Age of Onset  . Heart attack Father        deceased     Current medication list and allergy/intolerance information reviewed:    Current Outpatient Prescriptions  Medication Sig Dispense Refill  . acetaminophen (TYLENOL) 325 MG tablet Take 325-650 mg by mouth every 6 (six) hours as needed (for headaches or pain).    Marland Kitchen albuterol (PROVENTIL HFA;VENTOLIN HFA) 108 (90 Base) MCG/ACT inhaler Inhale 2 puffs into the lungs every 6 (six) hours as needed for wheezing or shortness of breath. 1 Inhaler 5  . amLODipine (NORVASC) 2.5 MG tablet Take 2.5 mg by mouth daily.    . Artificial Tear Solution (BION TEARS) 0.1-0.3 % SOLN Place 1 drop into both eyes every morning.     . Artificial Tear Solution (GENTEAL TEARS) 0.1-0.2-0.3 % SOLN Place 1 drop into both eyes at bedtime.    . B Complex Vitamins (VITAMIN B COMPLEX PO) Take 1 tablet by mouth daily.    Marland Kitchen BIOTIN PO Take 1 tablet by mouth daily.    . cholecalciferol (VITAMIN D) 1000 UNITS tablet Take 1,000 Units by mouth daily.    . Cyanocobalamin (VITAMIN B-12) 2500 MCG SUBL Take 2,500 mcg by mouth daily.     . hydrochlorothiazide (MICROZIDE) 12.5 MG capsule Take 1 capsule (12.5 mg total) by mouth daily. 30 capsule 5  . HYDROcodone-acetaminophen (NORCO/VICODIN) 5-325 MG tablet Take 1 tablet by  mouth every 4 (four) hours as needed. 16 tablet 0  . lactase (LACTAID) 3000 UNITS tablet Take 3,000 Units by mouth daily as needed (lactose intolerant).    Marland Kitchen levothyroxine (SYNTHROID, LEVOTHROID) 75 MCG tablet Take 75 mcg by mouth daily before breakfast.     . LORazepam (ATIVAN) 0.5 MG tablet Take 1 tablet by mouth at bedtime as needed for sleep.     . Multiple Vitamin (MULTIVITAMIN) tablet Take 1 tablet by mouth daily.    . predniSONE (DELTASONE) 10 MG tablet Take 10 mg by mouth daily with breakfast.    . Probiotic Product (PROBIOTIC DAILY PO) Take 1 capsule by mouth daily.     . ranitidine (ZANTAC) 150 MG tablet Take 150 mg by mouth 2 (two) times daily.      No current facility-administered medications for this visit.    Allergies  Allergen Reactions  . Sulfamethoxazole Nausea And Vomiting   . Tape Other (See Comments)    TAPE TEARS AND BRUISES THE SKIN!!  . Biaxin [Clarithromycin] Other (See Comments)    Bitter taste  . Ciprofloxacin Rash  . Codeine Nausea And Vomiting  . Dilantin [Phenytoin] Rash  . Doxycycline Nausea Only    Very nauseated  . Lactose Intolerance (Gi) Other (See Comments)    gas  . Restasis [Cyclosporine] Other (See Comments) and Swelling    Eyes burn   . Sulfonamide Derivatives Rash      Review of Systems:  Constitutional:  No  fever, no chills, No recent illness, No unintentional weight changes. +significant fatigue.   HEENT: No  headache, no vision change, no hearing change, No sore throat, No  sinus pressure  Cardiac: No  chest pain, No  pressure, No palpitations, No  Orthopnea  Respiratory:  No  shortness of breath. No  Cough  Gastrointestinal: No  abdominal pain, No  nausea,    Musculoskeletal: No new myalgia/arthralgia  Genitourinary: +incontinence, No  abnormal genital bleeding, No abnormal genital discharge  Skin: No  Rash, No other wounds/concerning lesions  Hem/Onc: No  easy bruising/bleeding,  Endocrine: No cold intolerance,  No heat intolerance. No polyuria/polydipsia/polyphagia   Neurologic: No  weakness, No  dizziness, No  slurred speech/focal weakness/facial droop  Psychiatric: +concerns with depression, +concerns with anxiety, No sleep problems, No mood problems  Exam:  BP (!) 146/81   Pulse (!) 101   Wt 133 lb (60.3 kg)   BMI 23.56 kg/m   Constitutional: VS see above. General Appearance: alert, well-developed, well-nourished, NAD  Eyes: Normal lids and conjunctive, non-icteric sclera  Ears, Nose, Mouth, Throat: MMM, Normal external inspection ears/nares/mouth/lips/gums.   Neck: No masses, trachea midline.  Respiratory: Normal respiratory effort. no wheeze, no rhonchi, no rales  Cardiovascular: S1/S2 normal, +murmur, no rub/gallop auscultated. RRR. No lower extremity edema.   Gastrointestinal: Nontender,  no masses. No hepatomegaly, no splenomegaly. No hernia appreciated. Bowel sounds normal. Rectal exam deferred.   Musculoskeletal: Gait normal. No clubbing/cyanosis of digits.   Neurological: Normal balance/coordination. No tremor.   Skin: warm, dry, intact. No rash/ulcer.   Psychiatric: Normal judgment/insight. Normal mood and affect. Oriented x person, place.    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, took Ativan of the list - Plan: escitalopram (LEXAPRO) 5 MG tablet  Need for immunization against influenza - Plan: Flu vaccine HIGH DOSE PF  Need for vaccination with 13-polyvalent pneumococcal conjugate vaccine - Plan: Pneumococcal conjugate vaccine 13-valent  Essential hypertension - Plan: TSH, CBC, COMPLETE METABOLIC PANEL WITH GFR,  Hemoglobin A1c, Lipid panel, VITAMIN D 25 Hydroxy (Vit-D Deficiency, Fractures), Vitamin B12  Nonallergic vasomotor rhinitis  Mild intermittent asthma, unspecified whether complicated  Chronic ulcerative colitis wth other complication, unspecified location [facility])  Diverticulitis of colon  Postoperative hypothyroidism  Meningioma (HCC)  Adrenal insufficiency (HCC)  Memory loss - Plan: TSH, CBC, COMPLETE METABOLIC PANEL WITH GFR  Anemia, unspecified type - Plan: CBC, Vitamin B12  Closed nondisplaced fracture of surgical neck of left humerus, unspecified fracture morphology, initial encounter - Plan: VITAMIN D 25 Hydroxy (Vit-D Deficiency, Fractures), DG Bone Density  History of prediabetes - Plan: Hemoglobin A1c, Lipid panel  B12 deficiency - Plan: Vitamin B12  Urinary incontinence, unspecified type - Plan: Urinalysis, Routine w reflex microscopic    Patient Instructions  Plan: Stop HCTZ and will follow up with BP recheck in 2 weeks  Would call Dr Jannifer Franklin to check on MRI and follow-up with him  Would take Mucinex daily to help loosen phlegm and see Dr. Annamaria Boots as scheduled  Need bone density test, Calcium 1300 -  1500 mg daily, Vitamin D [phone] IU daily   Would recommend follow-up with ENT as directed. I'm less worried about possible CSF leakage.     Visit summary with medication list and pertinent instructions was printed for patient to review. All questions at time of visit were answered - patient instructed to contact office with any additional concerns. ER/RTC precautions were reviewed with the patient. Follow-up plan: Return in about 2 weeks (around 02/15/2017) for go over labs and recheck blood pressure .  Note: Total time spent 60 minutes, greater than 50% of the visit was spent face-to-face counseling and coordinating care for the following: The primary encounter diagnosis was Anxious depression. Diagnoses of Need for immunization against influenza, Need for vaccination with 13-polyvalent pneumococcal conjugate vaccine, Essential hypertension, Nonallergic vasomotor rhinitis, Mild intermittent asthma, unspecified whether complicated, Chronic ulcerative colitis wth other complication, unspecified location [facility] Diverticulitis of colon, Postoperative hypothyroidism, Meningioma ([place]), Adrenal insufficiency ([place]), Memory loss, Anemia, unspecified type, Closed nondisplaced fracture of surgical neck of left humerus, unspecified fracture morphology, initial encounter, History of prediabetes, B12 deficiency, and Urinary incontinence, unspecified type were also pertinent to this visit.Marland Kitchen

## 2017-02-01 NOTE — Patient Instructions (Addendum)
Plan: Stop HCTZ and will follow up with BP recheck in 2 weeks  Would call Dr Jannifer Franklin to check on MRI and follow-up with him  Would take Mucinex daily to help loosen phlegm and see Dr. Annamaria Boots as scheduled  Need bone density test, Calcium 1300 - 1500 mg daily, Vitamin D [phone] IU daily

## 2017-02-02 LAB — HEMOGLOBIN A1C
EAG (MMOL/L): 7.6 (calc)
Hgb A1c MFr Bld: 6.4 % of total Hgb — ABNORMAL HIGH (ref <5.7)
Mean Plasma Glucose: 137 (calc)

## 2017-02-02 LAB — COMPLETE METABOLIC PANEL WITH GFR
AG RATIO: 1.5 (calc) (ref 1.0–2.5)
ALBUMIN MSPROF: 4 g/dL (ref 3.6–5.1)
ALKALINE PHOSPHATASE (APISO): 47 U/L (ref 33–130)
ALT: 13 U/L (ref 6–29)
AST: 16 U/L (ref 10–35)
BUN / CREAT RATIO: 22 (calc) (ref 6–22)
BUN: 23 mg/dL (ref 7–25)
CALCIUM: 9.8 mg/dL (ref 8.6–10.4)
CO2: 32 mmol/L (ref 20–32)
CREATININE: 1.03 mg/dL — AB (ref 0.60–0.88)
Chloride: 96 mmol/L — ABNORMAL LOW (ref 98–110)
GFR, EST NON AFRICAN AMERICAN: 50 mL/min/{1.73_m2} — AB (ref >=60)
GFR, Est African American: 57 mL/min/{1.73_m2} — ABNORMAL LOW (ref >=60)
GLOBULIN: 2.7 g/dL (ref 1.9–3.7)
Glucose, Bld: 86 mg/dL (ref 65–99)
POTASSIUM: 3.6 mmol/L (ref 3.5–5.3)
SODIUM: 137 mmol/L (ref 135–146)
Total Bilirubin: 0.4 mg/dL (ref 0.2–1.2)
Total Protein: 6.7 g/dL (ref 6.1–8.1)

## 2017-02-02 LAB — LIPID PANEL
CHOL/HDL RATIO: 5.1 (calc) — AB (ref <5.0)
CHOLESTEROL: 235 mg/dL — AB (ref <200)
HDL: 46 mg/dL — ABNORMAL LOW (ref >50)
LDL Cholesterol (Calc): 133 mg/dL (calc) — ABNORMAL HIGH
NON-HDL CHOLESTEROL (CALC): 189 mg/dL — AB (ref <130)
Triglycerides: 391 mg/dL — ABNORMAL HIGH (ref <150)

## 2017-02-02 LAB — CBC
HEMATOCRIT: 37 % (ref 35.0–45.0)
HEMOGLOBIN: 12.5 g/dL (ref 11.7–15.5)
MCH: 27.8 pg (ref 27.0–33.0)
MCHC: 33.8 g/dL (ref 32.0–36.0)
MCV: 82.4 fL (ref 80.0–100.0)
MPV: 9.4 fL (ref 7.5–12.5)
Platelets: 387 10*3/uL (ref 140–400)
RBC: 4.49 10*6/uL (ref 3.80–5.10)
RDW: 14.4 % (ref 11.0–15.0)
WBC: 14.5 10*3/uL — ABNORMAL HIGH (ref 3.8–10.8)

## 2017-02-02 LAB — VITAMIN D 25 HYDROXY (VIT D DEFICIENCY, FRACTURES): VIT D 25 HYDROXY: 44 ng/mL (ref 30–100)

## 2017-02-02 LAB — VITAMIN B12

## 2017-02-02 LAB — TSH: TSH: 1.68 mIU/L (ref 0.40–4.50)

## 2017-02-02 MED ORDER — LEVOTHYROXINE SODIUM 75 MCG PO TABS: 75.0000 ug | ORAL_TABLET | Freq: Every day | ORAL | 3 refills | Status: DC

## 2017-02-02 NOTE — Addendum Note (Signed)
Addended by: Maryla Morrow on: 02/02/2017 03:32 PM   Modules accepted: Orders

## 2017-02-03 ENCOUNTER — Encounter: Payer: Self-pay | Admitting: Osteopathic Medicine

## 2017-02-03 LAB — URINALYSIS, ROUTINE W REFLEX MICROSCOPIC
Bilirubin Urine: NEGATIVE
Glucose, UA: NEGATIVE
HGB URINE DIPSTICK: NEGATIVE
Ketones, ur: NEGATIVE
LEUKOCYTES UA: NEGATIVE
NITRITE: NEGATIVE
PH: 6.5 (ref 5.0–8.0)
PROTEIN: NEGATIVE
Specific Gravity, Urine: 1.015 (ref 1.001–1.03)

## 2017-02-07 NOTE — ED Provider Notes (Signed)
[facility] DEPT Provider Note   CSN: [id number] Arrival date & time: 01/26/17  1701     History   Chief Complaint No chief complaint on file.   HPI Sierra [name] is a 83 y.o. female. She complains fall, left shoulder pain.  HPI this is a afebrile female who lost her balance and fell and has a deformity of her left upper humerus with ecchymosis and pain. No dizziness or syncope. States this was a trip and fall.  Past Medical History:  Diagnosis Date  . Acute asthmatic bronchitis   . Acute respiratory failure with hypoxia ([place]) 06/11/2016  . Allergic rhinitis   . Arthritis    Hx R shoulder bursitis, pain L knee and L hip  . Asthmatic bronchitis   . ATYPICAL MYCOBACTERIAL INFECTION 05/05/2010   Sputum cx Pos 12/ 2011    . Balance problem    SINCE BRAIN TUMOR REMOVED IN 2002-BENIGN TUMOR-PT HAS ADRENAL INSUFFICIENCY AND TAKES DAILY PREDNISONE  . Blood transfusion   . Cardiac murmur    DOES NOT CAUSE ANY SYMPTOMS  . Chronic kidney disease   . Chronic pharyngitis   . Colonic diverticular abscess 02/01/2017  . Diverticulitis, colon   . Dizziness   . DOE (dyspnea on exertion)   . Esophageal reflux   . GI bleed 01/06/2013  . Hyperlipemia   . Hypertension   . Hypothyroidism   . IBS (irritable bowel syndrome)   . Leukocytosis   . LGI bleed 01/05/2013  . Other specified iron deficiency anemias   . PNEUMONIA 06/02/2010   Qualifier: Diagnosis of  By: Annamaria Boots MD, Clinton D   . Postop Hyponatremia 07/28/2011  . Rectal bleeding 01/03/2014  . Recurrent upper respiratory infection (URI) 07/10/2011    ACUTE BRONCHITIS - extra prednisone in addition to the daily prednisone and a Z-Pak  . Thrombocytosis ([place])   . THRUSH 04/30/2009   Qualifier: Diagnosis of  By: Annamaria Boots MD, Clinton D   . Ulceration, colon   . Ulcerative colitis     Patient Active Problem List   Diagnosis Date Noted  . Memory loss 12/17/2016  . Gait abnormality 12/17/2016  . CSF rhinorrhea 11/12/2016  . Edema  extremities 07/13/2016  . Influenza due to identified novel influenza A virus with other respiratory manifestations 06/11/2016  . Influenza A with respiratory manifestations 06/11/2016  . Dyspnea on exertion 12/04/2015  . Anemia 12/12/2014  . Anxiety 12/12/2014  . Asthma 12/12/2014  . Chronic kidney disease, stage II (mild) 12/12/2014  . H/O: GI bleed 12/12/2014  . Postural lightheadedness 12/12/2014  . 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[place]) 12/12/2014  . Adrenal insufficiency ([place]) 01/03/2014  . Ulcerative colitis, chronic ([place]) 01/03/2014  . Meningioma ([place]) 06/08/2013  . Acute blood loss anemia 01/06/2013  . Malaise and fatigue 09/21/2011  . Benign hypertensive heart disease without congestive heart failure 09/21/2011  . Atrial premature contractions 09/21/2011  . Osteoarthritis of hip 07/27/2011  . DYSPNEA ON EXERTION 05/30/2010  . Hypothyroidism 05/01/2010  . Ulcerative colitis ([place]) 03/02/2010  . HOARSENESS, CHRONIC 11/05/2009  . Asthma with bronchitis 04/30/2009  . Hyperlipidemia 07/11/2007  . ANEMIA, IRON DEFICIENCY, MICROCYTIC 07/11/2007  . LEUKOCYTOSIS 07/11/2007  . THROMBOCYTOSIS 07/11/2007  . Essential hypertension 07/11/2007  . PHARYNGITIS, CHRONIC 07/11/2007  . Nonallergic vasomotor rhinitis 07/11/2007  . Esophageal reflux 07/11/2007  . DIVERTICULITIS, COLON 07/11/2007  . ARTHRITIS 07/11/2007  . CARDIAC MURMUR 07/11/2007  . Diverticulitis of colon 07/11/2007    Past Surgical History:  Procedure Laterality Date  . ABDOMINAL HYSTERECTOMY  1985  . APPENDECTOMY    . COLONOSCOPY N/A 01/05/2013   Procedure: COLONOSCOPY;  Surgeon: Cleotis Nipper, MD;  Location: [facility] ENDOSCOPY;  Service: Endoscopy;  Laterality: N/A;  . COLONOSCOPY N/A 01/04/2014   Procedure: COLONOSCOPY;  Surgeon: Winfield Cunas., MD;  Location: [facility] ENDOSCOPY;  Service: Endoscopy;  Laterality: N/A;  . CRANIOTOMY    . DILATION AND CURETTAGE OF UTERUS  1967  . ESOPHAGOGASTRODUODENOSCOPY N/A 01/05/2013    Procedure: ESOPHAGOGASTRODUODENOSCOPY (EGD);  Surgeon: Cleotis Nipper, MD;  Location: [facility] ENDOSCOPY;  Service: Endoscopy;  Laterality: N/A;  . ESOPHAGOGASTRODUODENOSCOPY N/A 06/26/2014   Procedure: ESOPHAGOGASTRODUODENOSCOPY (EGD);  Surgeon: Winfield Cunas., MD;  Location: [facility] ENDOSCOPY;  Service: Endoscopy;  Laterality: N/A;  . EYE SURGERY     2003 -RIGHT CATARACT EXTRACTED AND LEFT WAS DONE IN 2004  . FLEXIBLE SIGMOIDOSCOPY N/A 06/26/2014   Procedure: FLEXIBLE SIGMOIDOSCOPY;  Surgeon: Winfield Cunas., MD;  Location: [facility] ENDOSCOPY;  Service: Endoscopy;  Laterality: N/A;  . FRONTALIS SUSPENSION  10-09-2010   lifting eyelids AND SECOND EYE SURGERY November 19, 2010  . meningioma resected  20O2  . PARTIAL COLECTOMY  2008  . subglottal mucocoel  2000  . THYROIDECTOMY  1986  . TONSILLECTOMY  1938  . TOTAL HIP ARTHROPLASTY  OCT 2006   right  . TOTAL HIP ARTHROPLASTY  07/27/2011   Procedure: TOTAL HIP ARTHROPLASTY;  Surgeon: Gearlean Alf, MD;  Location: [facility] ORS;  Service: Orthopedics;  Laterality: Left;  Marland Kitchen VESICOVAGINAL FISTULA CLOSURE W/ TAH    . WRIST TENDON LESION REMOVED 2007      OB History    No data available       Home Medications    Prior to Admission medications   Medication Sig Start Date End Date Taking? Authorizing Provider  acetaminophen (TYLENOL) 325 MG tablet Take 325-650 mg by mouth every 6 (six) hours as needed (for headaches or pain).   Yes [provider]  albuterol (PROVENTIL HFA;VENTOLIN HFA) 108 (90 Base) MCG/ACT inhaler Inhale 2 puffs into the lungs every 6 (six) hours as needed for wheezing or shortness of breath. 06/12/16 10/02/20 Yes Young, Clinton D, MD  amLODipine (NORVASC) 2.5 MG tablet Take 2.5 mg by mouth daily.   Yes [provider]  Artificial Tear Solution (BION TEARS) 0.1-0.3 % SOLN Place 1 drop into both eyes every morning.    Yes [provider]  Artificial Tear Solution (GENTEAL TEARS) 0.1-0.2-0.3 % SOLN Place 1 drop into  both eyes at bedtime.   Yes [provider]  B Complex Vitamins (VITAMIN B COMPLEX PO) Take 1 tablet by mouth daily.   Yes [provider]  BIOTIN PO Take 1 tablet by mouth daily.   Yes [provider]  cholecalciferol (VITAMIN D) 1000 UNITS tablet Take 1,000 Units by mouth daily.   Yes [provider]  Cyanocobalamin (VITAMIN B-12) 2500 MCG SUBL Take 2,500 mcg by mouth daily.    Yes [provider]  lactase (LACTAID) 3000 UNITS tablet Take 3,000 Units by mouth daily as needed (lactose intolerant).   Yes [provider]  Multiple Vitamin (MULTIVITAMIN) tablet Take 1 tablet by mouth daily.   Yes [provider]  predniSONE (DELTASONE) 10 MG tablet Take 10 mg by mouth daily with breakfast.   Yes [provider]  Probiotic Product (PROBIOTIC DAILY PO) Take 1 capsule by mouth daily.    Yes [provider]  ranitidine (ZANTAC) 150 MG tablet Take 150 mg  by mouth 2 (two) times daily.    Yes [provider]  escitalopram (LEXAPRO) 5 MG tablet Take 1 tablet (5 mg total) by mouth daily. 02/01/17   Emeterio Reeve, DO  HYDROcodone-acetaminophen (NORCO/VICODIN) 5-325 MG tablet Take 1 tablet by mouth every 4 (four) hours as needed. 01/26/17   Tanna Furry, MD  levothyroxine (SYNTHROID, LEVOTHROID) 75 MCG tablet Take 1 tablet (75 mcg total) by mouth daily before breakfast. 02/02/17   Emeterio Reeve, DO    Family History Family History  Problem Relation Age of Onset  . Heart attack Father        deceased    Social History Social History  Substance Use Topics  . Smoking status: Never Smoker  . Smokeless tobacco: Never Used  . Alcohol use Yes     Comment: occ glass of wine     Allergies   Sulfamethoxazole; Tape; Biaxin [clarithromycin]; Ciprofloxacin; Codeine; Dilantin [phenytoin]; Doxycycline; Lactose intolerance (gi); Restasis [cyclosporine]; and Sulfonamide derivatives   Review of Systems Review of  Systems  Constitutional: Negative for appetite change, chills, diaphoresis, fatigue and fever.  HENT: Negative for mouth sores, sore throat and trouble swallowing.   Eyes: Negative for visual disturbance.  Respiratory: Negative for cough, chest tightness, shortness of breath and wheezing.   Cardiovascular: Negative for chest pain.  Gastrointestinal: Negative for abdominal distention, abdominal pain, diarrhea, nausea and vomiting.  Endocrine: Negative for polydipsia, polyphagia and polyuria.  Genitourinary: Negative for dysuria, frequency and hematuria.  Musculoskeletal: Negative for gait problem.       Left shoulder pain, deformity  Skin: Negative for color change, pallor and rash.  Neurological: Negative for dizziness, syncope, light-headedness and headaches.  Hematological: Does not bruise/bleed easily.  Psychiatric/Behavioral: Negative for behavioral problems and confusion.     Physical Exam Updated Vital Signs BP (!) 160/81   Pulse 92   Temp 98.3 F (36.8 C) (Oral)   Resp (!) 22   SpO2 93%   Physical Exam  Constitutional: She is oriented to person, place, and time. She appears well-developed and well-nourished. No distress.  HENT:  Head: Normocephalic.  Eyes: Pupils are equal, round, and reactive to light. Conjunctivae are normal. No scleral icterus.  Neck: Normal range of motion. Neck supple. No thyromegaly present.  Cardiovascular: Normal rate and regular rhythm.  Exam reveals no gallop and no friction rub.   No murmur heard. Pulmonary/Chest: Effort normal and breath sounds normal. No respiratory distress. She has no wheezes. She has no rales.  Abdominal: Soft. Bowel sounds are normal. She exhibits no distension. There is no tenderness. There is no rebound.  Musculoskeletal: Normal range of motion.  Soft tissue swelling and prominence anteriorly of the left proximal shoulder near the glenohumeral joint. Her axillary nerve sensation is intact. Her nerve function is intact  3 distally is normal sensation dorsally, and over the thenar, hypothenar eminence. She can extend the wrist and digits. She can have adduct the fingers. She has normal grip strength. She has strong radial ulnar pulses.  Neurological: She is alert and oriented to person, place, and time.  Skin: Skin is warm and dry. No rash noted.  Psychiatric: She has a normal mood and affect. Her behavior is normal.     ED Treatments / Results  Labs (all labs ordered are listed, but only abnormal results are displayed) Labs Reviewed  CBC WITH DIFFERENTIAL/PLATELET - Abnormal; Notable for the following:       Result Value   WBC 14.1 (*)    Hemoglobin  11.5 (*)    HCT 34.7 (*)    Neutro Abs 11.8 (*)    All other components within normal limits  COMPREHENSIVE METABOLIC PANEL - Abnormal; Notable for the following:    Potassium 3.2 (*)    Glucose, Bld 171 (*)    Calcium 8.3 (*)    Total Protein 6.4 (*)    Albumin 3.4 (*)    All other components within normal limits  CBG MONITORING, ED - Abnormal; Notable for the following:    Glucose-Capillary 187 (*)    All other components within normal limits    EKG  EKG Interpretation None       Radiology No results found.  Procedures Procedures (including critical care time)  Medications Ordered in ED Medications  ondansetron (ZOFRAN) injection 4 mg (4 mg Intravenous Given 01/26/17 1733)  fentaNYL (SUBLIMAZE) injection 50 mcg (50 mcg Intravenous Given 01/26/17 2020)  propofol (DIPRIVAN) 10 mg/mL bolus/IV push 100 mg (100 mg Intravenous Given 01/26/17 2033)  propofol (DIPRIVAN) 10 mg/mL bolus/IV push (12.5 mg Intravenous Given 01/26/17 2053)     Initial Impression / Assessment and Plan / ED Course  I have reviewed the triage vital signs and the nursing notes.  Pertinent labs & imaging results that were available during my care of the patient were reviewed by me and considered in my medical decision making (see chart for details).    X-ray show  impacted left proximal humeral fracture. Patient given pain medication and light sedation. This was so x-rays could be obtained. She was placed into a more anatomic position and splinted with posterior splint for traction. Plan is home, pain medication. With the follow-up.  Final Clinical Impressions(s) / ED Diagnoses   Final diagnoses:  Other closed displaced fracture of proximal end of left humerus, initial encounter    New Prescriptions Discharge Medication List as of 01/26/2017  9:38 PM    START taking these medications   Details  HYDROcodone-acetaminophen (NORCO/VICODIN) 5-325 MG tablet Take 1 tablet by mouth every 4 (four) hours as needed., Starting Tue 01/26/2017, Print         Tanna Furry, MD 02/07/17 2043

## 2017-02-09 ENCOUNTER — Encounter: Payer: Self-pay | Admitting: Osteopathic Medicine

## 2017-02-11 ENCOUNTER — Ambulatory Visit: Payer: Medicare Other | Admitting: Osteopathic Medicine

## 2017-02-11 ENCOUNTER — Encounter: Payer: Self-pay | Admitting: Osteopathic Medicine

## 2017-02-16 ENCOUNTER — Encounter: Payer: Self-pay | Admitting: Osteopathic Medicine

## 2017-02-18 ENCOUNTER — Encounter: Payer: Self-pay | Admitting: Osteopathic Medicine

## 2017-02-18 ENCOUNTER — Ambulatory Visit (INDEPENDENT_AMBULATORY_CARE_PROVIDER_SITE_OTHER): Payer: Medicare Other | Admitting: Osteopathic Medicine

## 2017-02-18 VITALS — BP 119/63 | HR 93

## 2017-02-18 DIAGNOSIS — R413 Other amnesia: Secondary | ICD-10-CM

## 2017-02-18 DIAGNOSIS — S42215A Unspecified nondisplaced fracture of surgical neck of left humerus, initial encounter for closed fracture: Secondary | ICD-10-CM

## 2017-02-18 DIAGNOSIS — E890 Postprocedural hypothyroidism: Secondary | ICD-10-CM

## 2017-02-18 DIAGNOSIS — I10 Essential (primary) hypertension: Secondary | ICD-10-CM

## 2017-02-18 DIAGNOSIS — E2740 Unspecified adrenocortical insufficiency: Secondary | ICD-10-CM | POA: Diagnosis not present

## 2017-02-18 DIAGNOSIS — K51918 Ulcerative colitis, unspecified with other complication: Secondary | ICD-10-CM | POA: Diagnosis not present

## 2017-02-18 NOTE — Patient Instructions (Addendum)
Plan: STOP: Amlodipine (blood pressure medicine) STOP: Guaifenesin/Mucinex (cough/mucus) - not making any difference STOP: Escitalopram/Lexapro (anxiety) - not making any difference NEXT VISIT: for memory / cognitive test with Dr Sheppard Coil and recheck blood pressure off all the medicines.  CONSIDER: medications for cholesterol and pre-diabetes

## 2017-02-18 NOTE — Progress Notes (Signed)
HPI: Sierra [name] is a 83 y.o. female  who presents to [facility] today, 02/18/17,  for chief complaint of:  Chief Complaint  Patient presents with  . Follow-up    blood pressure     See emails.MyChart messages from daughter - reviewed. Pt is here today with her husband.   HTN: we stopped HCTZ d/t concern for dizziness, she is taking amlodipine, Blood pressure as noted below is on the low side. She reports occasional tiredness/fatigue, no LOC/fall.  Sierra [name] is participating in PT for broken humerus. Taking occasional Vicodin for pain.  Anxiety: Lexapro was recently started at low dose. Husband is having difficulty coping with her irritability the patient denies any problems with anxiety/depression.  Memory issues more noticeable since fall this past spring. Daughter requests referral to new neurology practice- does not want to travel to [place]. Previously seen by Dr Willis who ordered MRI wo contrast which hasn't been done yet. Patient states she doesn't feel she has much of a problem with memory, she is occasionally forgetful. Husband is worried that this problem seems to be getting worse.  We discussed patient's goals of reducing pill burden and simplify medication regimen.  Patient is accompanied by husband who assists with history-taking.   Past medical history, surgical history, social history and family history reviewed.  Patient Active Problem List   Diagnosis Date Noted  . Memory loss 12/17/2016  . Gait abnormality 12/17/2016  . CSF rhinorrhea 11/12/2016  . Edema extremities 07/13/2016  . Influenza due to identified novel influenza A virus with other respiratory manifestations 06/11/2016  . Influenza A with respiratory manifestations 06/11/2016  . Dyspnea on exertion 12/04/2015  . Anemia 12/12/2014  . Anxiety 12/12/2014  . Asthma 12/12/2014  . Chronic kidney disease, stage II (mild) 12/12/2014  . H/O: GI bleed 12/12/2014  .  Postural lightheadedness 12/12/2014  . 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HCC) 12/12/2014  . Adrenal insufficiency (HCC) 01/03/2014  . Ulcerative colitis, chronic (HCC) 01/03/2014  . Meningioma (HCC) 06/08/2013  . Acute blood loss anemia 01/06/2013  . Malaise and fatigue 09/21/2011  . Benign hypertensive heart disease without congestive heart failure 09/21/2011  . Atrial premature contractions 09/21/2011  . Osteoarthritis of hip 07/27/2011  . DYSPNEA ON EXERTION 05/30/2010  . Hypothyroidism 05/01/2010  . Ulcerative colitis (HCC) 03/02/2010  . HOARSENESS, CHRONIC 11/05/2009  . Asthma with bronchitis 04/30/2009  . Hyperlipidemia 07/11/2007  . ANEMIA, IRON DEFICIENCY, MICROCYTIC 07/11/2007  . LEUKOCYTOSIS 07/11/2007  . THROMBOCYTOSIS 07/11/2007  . Essential hypertension 07/11/2007  . PHARYNGITIS, CHRONIC 07/11/2007  . Nonallergic vasomotor rhinitis 07/11/2007  . Esophageal reflux 07/11/2007  . DIVERTICULITIS, COLON 07/11/2007  . ARTHRITIS 07/11/2007  . CARDIAC MURMUR 07/11/2007  . Diverticulitis of colon 07/11/2007    Current medication list and allergy/intolerance information reviewed.   Current Outpatient Prescriptions on File Prior to Visit  Medication Sig Dispense Refill  . acetaminophen (TYLENOL) 325 MG tablet Take 325-650 mg by mouth every 6 (six) hours as needed (for headaches or pain).    . albuterol (PROVENTIL HFA;VENTOLIN HFA) 108 (90 Base) MCG/ACT inhaler Inhale 2 puffs into the lungs every 6 (six) hours as needed for wheezing or shortness of breath. 1 Inhaler 5  . amLODipine (NORVASC) 2.5 MG tablet Take 2.5 mg by mouth daily.    . Artificial Tear Solution (BION TEARS) 0.1-0.3 % SOLN Place 1 drop into both eyes every morning.     . Artificial Tear Solution (GENTEAL TEARS) 0.1-0.2-0.3 % SOLN Place 1 drop   into both eyes at bedtime.    . B Complex Vitamins (VITAMIN B COMPLEX PO) Take 1 tablet by mouth daily.    . BIOTIN PO Take 1 tablet by mouth daily.    . cholecalciferol  (VITAMIN D) 1000 UNITS tablet Take 1,000 Units by mouth daily.    . Cyanocobalamin (VITAMIN B-12) 2500 MCG SUBL Take 2,500 mcg by mouth daily.     . escitalopram (LEXAPRO) 5 MG tablet Take 1 tablet (5 mg total) by mouth daily. 30 tablet 1  . HYDROcodone-acetaminophen (NORCO/VICODIN) 5-325 MG tablet Take 1 tablet by mouth every 4 (four) hours as needed. 16 tablet 0  . lactase (LACTAID) 3000 UNITS tablet Take 3,000 Units by mouth daily as needed (lactose intolerant).    . levothyroxine (SYNTHROID, LEVOTHROID) 75 MCG tablet Take 1 tablet (75 mcg total) by mouth daily before breakfast. 90 tablet 3  . Multiple Vitamin (MULTIVITAMIN) tablet Take 1 tablet by mouth daily.    . predniSONE (DELTASONE) 10 MG tablet Take 10 mg by mouth daily with breakfast.    . Probiotic Product (PROBIOTIC DAILY PO) Take 1 capsule by mouth daily.     . ranitidine (ZANTAC) 150 MG tablet Take 150 mg by mouth 2 (two) times daily.     . [DISCONTINUED] Alum & Mag Hydroxide-Simeth (MAGIC MOUTHWASH) SOLN Take 5 mLs by mouth 3 (three) times daily as needed. Thrush      No current facility-administered medications on file prior to visit.    Allergies  Allergen Reactions  . Sulfamethoxazole Nausea And Vomiting  . Tape Other (See Comments)    TAPE TEARS AND BRUISES THE SKIN!!  . Biaxin [Clarithromycin] Other (See Comments)    Bitter taste  . Ciprofloxacin Rash  . Codeine Nausea And Vomiting  . Dilantin [Phenytoin] Rash  . Doxycycline Nausea Only    Very nauseated  . Lactose Intolerance (Gi) Other (See Comments)    gas  . Restasis [Cyclosporine] Other (See Comments) and Swelling    Eyes burn   . Sulfonamide Derivatives Rash      Review of Systems:  Constitutional: No recent illness  HEENT: No  headache, no vision change  Cardiac: No  chest pain, No  pressure, No palpitations  Respiratory:  No  shortness of breath. No  Cough  Gastrointestinal: No  abdominal pain  Musculoskeletal: No new  myalgia/arthralgia  Neurologic: No  weakness, No  Dizziness  Psychiatric: No  concerns with depression, No  concerns with anxiety  Exam:  BP 119/63   Pulse 93   Constitutional: VS see above. General Appearance: alert, well-developed, well-nourished, NAD  Eyes: Normal lids and conjunctive, non-icteric sclera  Ears, Nose, Mouth, Throat: MMM, Normal external inspection ears/nares/mouth/lips/gums.  Neck: No masses, trachea midline.   Respiratory: Normal respiratory effort. no wheeze, no rhonchi, no rales  Cardiovascular: S1/S2 normal, no murmur, no rub/gallop auscultated. RRR.   Musculoskeletal: Gait normal. Symmetric and independent movement of all extremities  Neurological: Normal balance/coordination. No tremor.  Skin: warm, dry, intact.   Psychiatric: Normal judgment/insight. Normal mood and affect. Oriented x3.    Recent Results (from the past 2160 hour(s))  RPR     Status: None   Collection Time: 12/17/16 10:06 AM  Result Value Ref Range   RPR Ser Ql Non Reactive Non Reactive  Vitamin B12     Status: Abnormal   Collection Time: 12/17/16 10:06 AM  Result Value Ref Range   Vitamin B-12 >2000 (H) 232 - 1245 pg/mL  Sedimentation rate       Status: None   Collection Time: 12/17/16 10:06 AM  Result Value Ref Range   Sed Rate 10 0 - 40 mm/hr  Copper, serum     Status: None   Collection Time: 12/17/16 10:06 AM  Result Value Ref Range   Copper 78 72 - 166 ug/dL    Comment:                                 Detection Limit = 5  CBG monitoring, ED     Status: Abnormal   Collection Time: 01/26/17  5:27 PM  Result Value Ref Range   Glucose-Capillary 187 (H) 65 - 99 mg/dL  CBC with Differential     Status: Abnormal   Collection Time: 01/26/17  5:35 PM  Result Value Ref Range   WBC 14.1 (H) 4.0 - 10.5 K/uL   RBC 4.03 3.87 - 5.11 MIL/uL   Hemoglobin 11.5 (L) 12.0 - 15.0 g/dL   HCT 34.7 (L) 36.0 - 46.0 %   MCV 86.1 78.0 - 100.0 fL   MCH 28.5 26.0 - 34.0 pg   MCHC 33.1 30.0  - 36.0 g/dL   RDW 15.1 11.5 - 15.5 %   Platelets 280 150 - 400 K/uL   Neutrophils Relative % 84 %   Neutro Abs 11.8 (H) 1.7 - 7.7 K/uL   Lymphocytes Relative 9 %   Lymphs Abs 1.3 0.7 - 4.0 K/uL   Monocytes Relative 7 %   Monocytes Absolute 1.0 0.1 - 1.0 K/uL   Eosinophils Relative 0 %   Eosinophils Absolute 0.0 0.0 - 0.7 K/uL   Basophils Relative 0 %   Basophils Absolute 0.0 0.0 - 0.1 K/uL  Comprehensive metabolic panel     Status: Abnormal   Collection Time: 01/26/17  5:35 PM  Result Value Ref Range   Sodium 138 135 - 145 mmol/L   Potassium 3.2 (L) 3.5 - 5.1 mmol/L   Chloride 103 101 - 111 mmol/L   CO2 23 22 - 32 mmol/L   Glucose, Bld 171 (H) 65 - 99 mg/dL   BUN 20 6 - 20 mg/dL   Creatinine, Ser 0.73 0.44 - 1.00 mg/dL   Calcium 8.3 (L) 8.9 - 10.3 mg/dL   Total Protein 6.4 (L) 6.5 - 8.1 g/dL   Albumin 3.4 (L) 3.5 - 5.0 g/dL   AST 24 15 - 41 U/L   ALT 19 14 - 54 U/L   Alkaline Phosphatase 39 38 - 126 U/L   Total Bilirubin 0.5 0.3 - 1.2 mg/dL   GFR calc non Af Amer >60 >60 mL/min   GFR calc Af Amer >60 >60 mL/min    Comment: (NOTE) The eGFR has been calculated using the CKD EPI equation. This calculation has not been validated in all clinical situations. eGFR's persistently <60 mL/min signify possible Chronic Kidney Disease.    Anion gap 12 5 - 15  TSH     Status: None   Collection Time: 02/01/17 11:28 AM  Result Value Ref Range   TSH 1.68 0.40 - 4.50 mIU/L  CBC     Status: Abnormal   Collection Time: 02/01/17 11:28 AM  Result Value Ref Range   WBC 14.5 (H) 3.8 - 10.8 Thousand/uL   RBC 4.49 3.80 - 5.10 Million/uL   Hemoglobin 12.5 11.7 - 15.5 g/dL   HCT 37.0 35.0 - 45.0 %   MCV 82.4 80.0 - 100.0 fL   MCH 27.8   27.0 - 33.0 pg   MCHC 33.8 32.0 - 36.0 g/dL   RDW 14.4 11.0 - 15.0 %   Platelets 387 140 - 400 Thousand/uL   MPV 9.4 7.5 - 12.5 fL  COMPLETE METABOLIC PANEL WITH GFR     Status: Abnormal   Collection Time: 02/01/17 11:28 AM  Result Value Ref Range    Glucose, Bld 86 65 - 99 mg/dL    Comment: .            Fasting reference interval .    BUN 23 7 - 25 mg/dL   Creat 1.03 (H) 0.60 - 0.88 mg/dL    Comment: For patients >37 years of age, the reference limit for Creatinine is approximately 13% higher for people identified as African-American. .    GFR, Est Non African American 50 (L) > OR = 60 mL/min/1.20m   GFR, Est African American 57 (L) > OR = 60 mL/min/1.746m  BUN/Creatinine Ratio 22 6 - 22 (calc)   Sodium 137 135 - 146 mmol/L   Potassium 3.6 3.5 - 5.3 mmol/L   Chloride 96 (L) 98 - 110 mmol/L   CO2 32 20 - 32 mmol/L   Calcium 9.8 8.6 - 10.4 mg/dL   Total Protein 6.7 6.1 - 8.1 g/dL   Albumin 4.0 3.6 - 5.1 g/dL   Globulin 2.7 1.9 - 3.7 g/dL (calc)   AG Ratio 1.5 1.0 - 2.5 (calc)   Total Bilirubin 0.4 0.2 - 1.2 mg/dL   Alkaline phosphatase (APISO) 47 33 - 130 U/L   AST 16 10 - 35 U/L   ALT 13 6 - 29 U/L  Hemoglobin A1c     Status: Abnormal   Collection Time: 02/01/17 11:28 AM  Result Value Ref Range   Hgb A1c MFr Bld 6.4 (H) <5.7 % of total Hgb    Comment: For someone without known diabetes, a hemoglobin  A1c value between 5.7% and 6.4% is consistent with prediabetes and should be confirmed with a  follow-up test. . For someone with known diabetes, a value <7% indicates that their diabetes is well controlled. A1c targets should be individualized based on duration of diabetes, age, comorbid conditions, and other considerations. . This assay result is consistent with an increased risk of diabetes. . Currently, no consensus exists regarding use of hemoglobin A1c for diagnosis of diabetes for children. .    Mean Plasma Glucose 137 (calc)   eAG (mmol/L) 7.6 (calc)  Lipid panel     Status: Abnormal   Collection Time: 02/01/17 11:28 AM  Result Value Ref Range   Cholesterol 235 (H) <200 mg/dL   HDL 46 (L) >50 mg/dL   Triglycerides 391 (H) <150 mg/dL   LDL Cholesterol (Calc) 133 (H) mg/dL (calc)    Comment:  Reference range: <100 . Desirable range <100 mg/dL for primary prevention;   <70 mg/dL for patients with CHD or diabetic patients  with > or = 2 CHD risk factors. . Marland KitchenDL-C is now calculated using the Martin-Hopkins  calculation, which is a validated novel method providing  better accuracy than the Friedewald equation in the  estimation of LDL-C.  MaCresenciano Genret al. JAAnnamaria [id number]  (http://education.[facility].com/faq/FAQ164)    Total CHOL/HDL Ratio 5.1 (H) <5.0 (calc)   Non-HDL Cholesterol (Calc) 189 (H) <130 mg/dL (calc)    Comment: For patients with diabetes plus 1 major ASCVD risk  factor, treating to a non-HDL-C goal of <100 mg/dL  (LDL-C of <70 mg/dL) is considered a therapeutic  option.   VITAMIN D 25 Hydroxy (Vit-D Deficiency, Fractures)     Status: None   Collection Time: 02/01/17 11:28 AM  Result Value Ref Range   Vit D, 25-Hydroxy 44 30 - 100 ng/mL    Comment: Vitamin D Status         25-OH Vitamin D: . Deficiency:                    <20 ng/mL Insufficiency:             20 - 29 ng/mL Optimal:                 > or = 30 ng/mL . For 25-OH Vitamin D testing on patients on  D2-supplementation and patients for whom quantitation  of D2 and D3 fractions is required, the QuestAssureD(TM) 25-OH VIT D, (D2,D3), LC/MS/MS is recommended: order  code 92888 (patients >2yrs). . For more information on this test, go to: http://education.[facility].com/faq/FAQ163 (This link is being provided for  informational/educational purposes only.)   Vitamin B12     Status: Abnormal   Collection Time: 02/01/17 11:28 AM  Result Value Ref Range   Vitamin B-12 >2,000 (H) 200 - 1,100 pg/mL  Urinalysis, Routine w reflex microscopic     Status: None   Collection Time: 02/02/17 12:06 PM  Result Value Ref Range   Color, Urine YELLOW YELLOW   APPearance CLEAR CLEAR   Specific Gravity, Urine 1.015 1.001 - 1.03   pH 6.5 5.0 - 8.0   Glucose, UA NEGATIVE NEGATIVE    Bilirubin Urine NEGATIVE NEGATIVE   Ketones, ur NEGATIVE NEGATIVE   Hgb urine dipstick NEGATIVE NEGATIVE   Protein, ur NEGATIVE NEGATIVE   Nitrite NEGATIVE NEGATIVE   Leukocytes, UA NEGATIVE NEGATIVE    No results found.  No flowsheet data found.  Depression screen PHQ 2/9 02/01/2017  Decreased Interest 1  Down, Depressed, Hopeless 1  PHQ - 2 Score 2  Altered sleeping 0  Tired, decreased energy 3  Change in appetite 0  Feeling bad or failure about yourself  0  Trouble concentrating 0  Moving slowly or fidgety/restless 0  Suicidal thoughts 0  PHQ-9 Score 5  Difficult doing work/chores Not difficult at all      ASSESSMENT/PLAN: The primary encounter diagnosis was Essential hypertension. Diagnoses of Postoperative hypothyroidism, Memory loss, Adrenal insufficiency (HCC), Closed nondisplaced fracture of surgical neck of left humerus, unspecified fracture morphology, initial encounter, and Chronic ulcerative colitis wth other complication, unspecified location (HCC) were also pertinent to this visit.    Patient Instructions  Plan: STOP: Amlodipine (blood pressure medicine) STOP: Guaifenesin/Mucinex (cough/mucus) - not making any difference STOP: Escitalopram/Lexapro (anxiety) - not making any difference NEXT VISIT: for memory / cognitive test with Dr  and recheck blood pressure off all the medicines.  CONSIDER: medications for cholesterol and pre-diabetes     Follow-up plan: Return for cognitive / memory testing (OV40) .  Visit summary with medication list and pertinent instructions was printed for patient to review, alert us if any changes needed. All questions at time of visit were answered - patient instructed to contact office with any additional concerns. ER/RTC precautions were reviewed with the patient and understanding verbalized.   Note: Total time spent 25 minutes, greater than 50% of the visit was spent face-to-face counseling and coordinating care for the  following: The primary encounter diagnosis was Essential hypertension. Diagnoses of Postoperative hypothyroidism, Memory loss, Adrenal insufficiency (HCC), Closed nondisplaced fracture of surgical neck of   left humerus, unspecified fracture morphology, initial encounter, and Chronic ulcerative colitis wth other complication, unspecified location [facility]) were also pertinent to this visit.Marland Kitchen

## 2017-02-19 ENCOUNTER — Encounter: Payer: Self-pay | Admitting: Osteopathic Medicine

## 2017-02-20 ENCOUNTER — Encounter (HOSPITAL_COMMUNITY): Payer: Self-pay | Admitting: Emergency Medicine

## 2017-02-20 ENCOUNTER — Emergency Department (HOSPITAL_COMMUNITY): Payer: Medicare Other

## 2017-02-20 ENCOUNTER — Emergency Department (HOSPITAL_COMMUNITY)
Admission: EM | Admit: 2017-02-20 | Discharge: 2017-02-20 | Disposition: A | Discharge status: Home / Self Care | Payer: Medicare Other | Attending: Emergency Medicine | Admitting: Emergency Medicine

## 2017-02-20 DIAGNOSIS — E039 Hypothyroidism, unspecified: Secondary | ICD-10-CM | POA: Diagnosis not present

## 2017-02-20 DIAGNOSIS — J45909 Unspecified asthma, uncomplicated: Secondary | ICD-10-CM | POA: Insufficient documentation

## 2017-02-20 DIAGNOSIS — Z79899 Other long term (current) drug therapy: Secondary | ICD-10-CM | POA: Diagnosis not present

## 2017-02-20 DIAGNOSIS — Y33XXXD Other specified events, undetermined intent, subsequent encounter: Secondary | ICD-10-CM | POA: Diagnosis not present

## 2017-02-20 DIAGNOSIS — E785 Hyperlipidemia, unspecified: Secondary | ICD-10-CM | POA: Insufficient documentation

## 2017-02-20 DIAGNOSIS — I129 Hypertensive chronic kidney disease with stage 1 through stage 4 chronic kidney disease, or unspecified chronic kidney disease: Secondary | ICD-10-CM | POA: Diagnosis not present

## 2017-02-20 DIAGNOSIS — N182 Chronic kidney disease, stage 2 (mild): Secondary | ICD-10-CM | POA: Insufficient documentation

## 2017-02-20 DIAGNOSIS — S42295D Other nondisplaced fracture of upper end of left humerus, subsequent encounter for fracture with routine healing: Secondary | ICD-10-CM | POA: Insufficient documentation

## 2017-02-20 DIAGNOSIS — S42292D Other displaced fracture of upper end of left humerus, subsequent encounter for fracture with routine healing: Secondary | ICD-10-CM

## 2017-02-20 DIAGNOSIS — M79602 Pain in left arm: Secondary | ICD-10-CM

## 2017-02-20 MED ORDER — HYDROCODONE-ACETAMINOPHEN 5-325 MG PO TABS: 1.0000 | ORAL_TABLET | ORAL | 0 refills | Status: DC | PRN

## 2017-02-20 MED ORDER — HYDROCODONE-ACETAMINOPHEN 5-325 MG PO TABS
1.0000 | ORAL_TABLET | Freq: Once | ORAL | Status: AC
  Administered 2017-02-20: 1 via ORAL
  Filled 2017-02-20: qty 1

## 2017-02-20 NOTE — Discharge Instructions (Signed)
You x-ray shows that your arm is healing appropriately.  Please follow-up with your specialist or primary care doctor next week for re-check and for ongoing pain control management  Please return without fail for worsening symptoms, including fever, worsening pain, new numbness/weakness, or any other symptoms concerning to you.

## 2017-02-20 NOTE — ED Provider Notes (Signed)
[facility] DEPT Provider Note   CSN: [id number] Arrival date & time: 02/20/17  1125     History   Chief Complaint Chief Complaint  Patient presents with  . Arm Pain    HPI Sierra [name] is a 83 y.o. female.  HPI 83 year old female who presents with left arm pain. History of HTN, HLD, asthma. Seen in ED 01/26/17 for left arm pain and diagnosed with proximal humerus fracture. Pain has been well controlled by tylenol recently per patient's husband, but this morning, pain uncontrollable. No recent falls or trauma since initial injury. Denies numbness/weakness. She has been in a retirement home and just came home 3 days ago, receiving tylenol for pain. Reports her pain was controlled in retirement home, and per pharmacy, she was receiving hydrocodone and tramadol. Husband does not think she received that since she has been back home.  Past Medical History:  Diagnosis Date  . Acute asthmatic bronchitis   . Acute respiratory failure with hypoxia ([place]) 06/11/2016  . Allergic rhinitis   . Arthritis    Hx R shoulder bursitis, pain L knee and L hip  . Asthmatic bronchitis   . ATYPICAL MYCOBACTERIAL INFECTION 05/05/2010   Sputum cx Pos 12/ 2011    . Balance problem    SINCE BRAIN TUMOR REMOVED IN 2002-BENIGN TUMOR-PT HAS ADRENAL INSUFFICIENCY AND TAKES DAILY PREDNISONE  . Blood transfusion   . Cardiac murmur    DOES NOT CAUSE ANY SYMPTOMS  . Chronic kidney disease   . Chronic pharyngitis   . Colonic diverticular abscess 02/01/2017  . Diverticulitis, colon   . Dizziness   . DOE (dyspnea on exertion)   . Esophageal reflux   . GI bleed 01/06/2013  . Hyperlipemia   . Hypertension   . Hypothyroidism   . IBS (irritable bowel syndrome)   . Leukocytosis   . LGI bleed 01/05/2013  . Other specified iron deficiency anemias   . PNEUMONIA 06/02/2010   Qualifier: Diagnosis of  By: Annamaria Boots MD, Clinton D   . Postop Hyponatremia 07/28/2011  . Rectal bleeding 01/03/2014  . Recurrent upper  respiratory infection (URI) 07/10/2011    ACUTE BRONCHITIS - extra prednisone in addition to the daily prednisone and a Z-Pak  . Thrombocytosis ([place])   . THRUSH 04/30/2009   Qualifier: Diagnosis of  By: Annamaria Boots MD, Clinton D   . Ulceration, colon   . Ulcerative colitis     Patient Active Problem List   Diagnosis Date Noted  . Memory loss 12/17/2016  . Gait abnormality 12/17/2016  . CSF rhinorrhea 11/12/2016  . Edema extremities 07/13/2016  . Influenza due to identified novel influenza A virus with other respiratory manifestations 06/11/2016  . Influenza A with respiratory manifestations 06/11/2016  . Dyspnea on exertion 12/04/2015  . Anemia 12/12/2014  . Anxiety 12/12/2014  . Asthma 12/12/2014  . Chronic kidney disease, stage II (mild) 12/12/2014  . H/O: GI bleed 12/12/2014  . Postural lightheadedness 12/12/2014  . 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[place]) 12/12/2014  . Adrenal insufficiency ([place]) 01/03/2014  . Ulcerative colitis, chronic ([place]) 01/03/2014  . Meningioma ([place]) 06/08/2013  . Acute blood loss anemia 01/06/2013  . Malaise and fatigue 09/21/2011  . Benign hypertensive heart disease without congestive heart failure 09/21/2011  . Atrial premature contractions 09/21/2011  . Osteoarthritis of hip 07/27/2011  . DYSPNEA ON EXERTION 05/30/2010  . Hypothyroidism 05/01/2010  . Ulcerative colitis ([place]) 03/02/2010  . HOARSENESS, CHRONIC 11/05/2009  . Asthma with bronchitis 04/30/2009  . Hyperlipidemia 07/11/2007  .  ANEMIA, IRON DEFICIENCY, MICROCYTIC 07/11/2007  . LEUKOCYTOSIS 07/11/2007  . THROMBOCYTOSIS 07/11/2007  . Essential hypertension 07/11/2007  . PHARYNGITIS, CHRONIC 07/11/2007  . Nonallergic vasomotor rhinitis 07/11/2007  . Esophageal reflux 07/11/2007  . DIVERTICULITIS, COLON 07/11/2007  . ARTHRITIS 07/11/2007  . CARDIAC MURMUR 07/11/2007  . Diverticulitis of colon 07/11/2007    Past Surgical History:  Procedure Laterality Date  . ABDOMINAL HYSTERECTOMY  1985  .  APPENDECTOMY    . COLONOSCOPY N/A 01/05/2013   Procedure: COLONOSCOPY;  Surgeon: Cleotis Nipper, MD;  Location: [facility] ENDOSCOPY;  Service: Endoscopy;  Laterality: N/A;  . COLONOSCOPY N/A 01/04/2014   Procedure: COLONOSCOPY;  Surgeon: Winfield Cunas., MD;  Location: [facility] ENDOSCOPY;  Service: Endoscopy;  Laterality: N/A;  . CRANIOTOMY    . DILATION AND CURETTAGE OF UTERUS  1967  . ESOPHAGOGASTRODUODENOSCOPY N/A 01/05/2013   Procedure: ESOPHAGOGASTRODUODENOSCOPY (EGD);  Surgeon: Cleotis Nipper, MD;  Location[facility] ENDOSCOPY;  Service: Endoscopy;  Laterality: N/A;  . ESOPHAGOGASTRODUODENOSCOPY N/A 06/26/2014   Procedure: ESOPHAGOGASTRODUODENOSCOPY (EGD);  Surgeon: Winfield Cunas., MD;  Location: [facility] ENDOSCOPY;  Service: Endoscopy;  Laterality: N/A;  . EYE SURGERY     2003 -RIGHT CATARACT EXTRACTED AND LEFT WAS DONE IN 2004  . FLEXIBLE SIGMOIDOSCOPY N/A 06/26/2014   Procedure: FLEXIBLE SIGMOIDOSCOPY;  Surgeon: Winfield Cunas., MD;  Location: [facility] ENDOSCOPY;  Service: Endoscopy;  Laterality: N/A;  . FRONTALIS SUSPENSION  10-09-2010   lifting eyelids AND SECOND EYE SURGERY November 19, 2010  . meningioma resected  20O2  . PARTIAL COLECTOMY  2008  . subglottal mucocoel  2000  . THYROIDECTOMY  1986  . TONSILLECTOMY  1938  . TOTAL HIP ARTHROPLASTY  OCT 2006   right  . TOTAL HIP ARTHROPLASTY  07/27/2011   Procedure: TOTAL HIP ARTHROPLASTY;  Surgeon: Gearlean Alf, MD;  Location: [facility] ORS;  Service: Orthopedics;  Laterality: Left;  Marland Kitchen VESICOVAGINAL FISTULA CLOSURE W/ TAH    . WRIST TENDON LESION REMOVED 2007      OB History    No data available       Home Medications    Prior to Admission medications   Medication Sig Start Date End Date Taking? Authorizing Provider  acetaminophen (TYLENOL) 325 MG tablet Take 325-650 mg by mouth every 6 (six) hours as needed (for headaches or pain).   Yes [provider]  albuterol (PROVENTIL HFA;VENTOLIN HFA) 108 (90 Base) MCG/ACT inhaler Inhale 2  puffs into the lungs every 6 (six) hours as needed for wheezing or shortness of breath. 06/12/16 10/02/20 Yes Young, Clinton D, MD  amLODipine (NORVASC) 2.5 MG tablet Take 2.5 mg by mouth daily.   Yes [provider]  Artificial Tear Solution (BION TEARS) 0.1-0.3 % SOLN Place 1 drop into both eyes every morning.    Yes [provider]  Artificial Tear Solution (GENTEAL TEARS) 0.1-0.2-0.3 % SOLN Place 1 drop into both eyes at bedtime.   Yes [provider]  B Complex Vitamins (VITAMIN B COMPLEX PO) Take 1 tablet by mouth daily.   Yes [provider]  BIOTIN PO Take 1 tablet by mouth daily.   Yes [provider]  Calcium Carbonate-Vitamin D3 (CALCIUM 600-D) 600-400 MG-UNIT TABS Take 1 tablet by mouth daily.   Yes [provider]  cholecalciferol (VITAMIN D) 1000 UNITS tablet Take 1,000 Units by mouth daily.   Yes [provider]  Cyanocobalamin (VITAMIN B-12) 2500 MCG SUBL Take 2,500 mcg by mouth daily.    Yes  [provider]  dextromethorphan-guaiFENesin (MUCINEX DM) 30-600 MG 12hr tablet Take 1 tablet by mouth 2 (two) times daily.   Yes [provider]  escitalopram (LEXAPRO) 5 MG tablet Take 5 mg by mouth daily. 02/01/17  Yes [provider]  HYDROcodone-acetaminophen (NORCO/VICODIN) 5-325 MG tablet Take 1 tablet by mouth every 4 (four) hours as needed. 01/26/17  Yes Tanna Furry, MD  lactase (LACTAID) 3000 UNITS tablet Take 3,000 Units by mouth daily as needed (lactose intolerant).   Yes [provider]  levothyroxine (SYNTHROID, LEVOTHROID) 75 MCG tablet Take 1 tablet (75 mcg total) by mouth daily before breakfast. 02/02/17  Yes Emeterio Reeve, DO  mesalamine (LIALDA) 1.2 g EC tablet Take 2.4 g by mouth daily. 02/08/17  Yes [provider]  Multiple Vitamin (MULTIVITAMIN) tablet Take 1 tablet by mouth daily.   Yes [provider]  predniSONE (DELTASONE) 10 MG tablet Take 10 mg by mouth  daily with breakfast.   Yes [provider]  Probiotic Product (PROBIOTIC DAILY PO) Take 1 capsule by mouth daily.    Yes [provider]  ranitidine (ZANTAC) 150 MG tablet Take 150 mg by mouth 2 (two) times daily.    Yes [provider]  traMADol (ULTRAM) 50 MG tablet Take 50 mg by mouth daily as needed for pain. 02/01/17  Yes [provider]  HYDROcodone-acetaminophen (NORCO/VICODIN) 5-325 MG tablet Take 1 tablet by mouth every 4 (four) hours as needed for moderate pain or severe pain. 02/20/17   Forde Dandy, MD    Family History Family History  Problem Relation Age of Onset  . Heart attack Father        deceased    Social History Social History  Substance Use Topics  . Smoking status: Never Smoker  . Smokeless tobacco: Never Used  . Alcohol use Yes     Comment: occ glass of wine     Allergies   Sulfamethoxazole; Tape; Biaxin [clarithromycin]; Ciprofloxacin; Codeine; Dilantin [phenytoin]; Doxycycline; Lactose intolerance (gi); Restasis [cyclosporine]; and Sulfonamide derivatives   Review of Systems Review of Systems  Constitutional: Negative for fever.  Respiratory: Negative for shortness of breath.   Cardiovascular: Negative for chest pain.  Gastrointestinal: Negative for abdominal pain.  Musculoskeletal:       Left shoulder pain   Skin: Negative for wound.  Neurological: Negative for weakness and numbness.  Hematological: Does not bruise/bleed easily.  All other systems reviewed and are negative.    Physical Exam Updated Vital Signs BP 125/76   Pulse 91   Temp 97.9 F (36.6 C) (Oral)   Resp 20   SpO2 96%   Physical Exam Physical Exam  Nursing note and vitals reviewed. Constitutional: Well developed, well nourished, non-toxic, and in no acute distress Head: Normocephalic and atraumatic.  Mouth/Throat: Oropharynx is clear and moist.  Neck: Normal range of motion. Neck supple.  Cardiovascular: Normal rate and regular  rhythm.  +2 radial pulse bilaterally Pulmonary/Chest: Effort normal and breath sounds normal.  Abdominal: Soft. There is no tenderness. There is no rebound and no guarding.  Musculoskeletal: Tenderness over left shoulder and proximal arm. No significant soft tissue swelling.  Neurological: Alert, no facial droop, fluent speech, intact sensation in bilateral upper extremities, in tact innervation in radial, ulnar, median nerves of the LUE Skin: Skin is warm and dry.  Psychiatric: Cooperative  ED Treatments / Results  Labs (all labs ordered are listed, but only abnormal results are displayed) Labs Reviewed - No data to display  EKG  EKG Interpretation  Date/Time:  Saturday February 20 2017 12:21:53 EDT Ventricular Rate:  93 PR Interval:    QRS Duration: 109 QT Interval:  363 QTC Calculation: 452 R Axis:   84 Text Interpretation:  Sinus rhythm Borderline right axis deviation Borderline T abnormalities, anterior leads Baseline wander in lead(s) V2 NO acute changes  Confirmed by Brantley Stage [id number]) on 02/20/2017 1:07:27 PM       Radiology Dg Humerus Left  Result Date: 02/20/2017 CLINICAL DATA:  The patient suffered a left humerus fracture due to a fall 3 weeks ago. Worsened pain today. EXAM: LEFT HUMERUS - 2+ VIEW COMPARISON:  Plain films left shoulder 01/26/2017. FINDINGS: Again seen is mildly comminuted fracture of the surgical neck of the left humerus. Position and alignment are near anatomic and appear improved compared to the prior exams. There is callus formation about the fracture. No new abnormality is identified. IMPRESSION: Healing surgical neck fracture left humerus demonstrates improved position and alignment compared to the prior exam. No new abnormality Electronically Signed   By: Inge Rise M.D.   On: 02/20/2017 13:28    Procedures Procedures (including critical care time)  Medications Ordered in ED Medications  HYDROcodone-acetaminophen (NORCO/VICODIN) 5-325 MG  per tablet 1 tablet (1 tablet Oral Given 02/20/17 1250)     Initial Impression / Assessment and Plan / ED Course  I have reviewed the triage vital signs and the nursing notes.  Pertinent labs & imaging results that were available during my care of the patient were reviewed by me and considered in my medical decision making (see chart for details).     83 year old female with recent proximal humerus fracture who presents with worsening left arm pain after leaving retirement facility. According to reviewed by the pharmacist, patient was receiving tramadol and hydrocodone well at retirement facility. Since discharge 2 days ago she has only been taking extra strength Tylenol. I suspect this may be the reason why she is having some more pain today. Her extremities seems well perfused. She has some old bruising from her fall over the left arm, but her compartments are soft. There are no significant swelling. No overlying skin changes that would suggest infection. A repeat x-ray does show well-healing fracture. She is given 1 hydrocodone, and states that her pain has now subsided. Will send her home with few tablets of hydrocodone for break through pain. Discussed with husband regarding close follow-up with PCP and her orthopedic specialist next week for recheck and ongoing pain management Strict return and follow-up instructions reviewed. She expressed understanding of all discharge instructions and felt comfortable with the plan of care.   Final Clinical Impressions(s) / ED Diagnoses   Final diagnoses:  Left arm pain  Humerus head fracture, left, with routine healing, subsequent encounter    New Prescriptions New Prescriptions   HYDROCODONE-ACETAMINOPHEN (NORCO/VICODIN) 5-325 MG TABLET    Take 1 tablet by mouth every 4 (four) hours as needed for moderate pain or severe pain.     Forde Dandy, MD 02/20/17 1537

## 2017-02-20 NOTE — ED Notes (Signed)
Pt fell 4 weeks ago. Pt was seen by MD and was in hospital about 2 days ago and pain became extreme on and off. Pt had her left shoulder placed while in hospital per pts significant other.

## 2017-02-20 NOTE — ED Triage Notes (Signed)
Patient fell fracturing her left arm/shoulder and in sling. Patient reports after waking up today had severe pain in left arm/shoulder.

## 2017-02-20 NOTE — ED Notes (Signed)
Patient was taking extra strength tylenol yesterday per husband but hasnt had any today.

## 2017-02-21 ENCOUNTER — Encounter: Payer: Self-pay | Admitting: Osteopathic Medicine

## 2017-02-23 ENCOUNTER — Ambulatory Visit (INDEPENDENT_AMBULATORY_CARE_PROVIDER_SITE_OTHER): Payer: Medicare Other | Admitting: Internal Medicine

## 2017-02-23 ENCOUNTER — Encounter: Payer: Self-pay | Admitting: Internal Medicine

## 2017-02-23 DIAGNOSIS — J4530 Mild persistent asthma, uncomplicated: Secondary | ICD-10-CM

## 2017-02-23 MED ORDER — AMOXICILLIN 500 MG PO TABS: 500.0000 mg | ORAL_TABLET | Freq: Two times a day (BID) | ORAL | 0 refills | Status: DC

## 2017-02-23 NOTE — Progress Notes (Signed)
[name], female    DOB: 01-29-1929, 83 y.o.   MRN: [id number]  HPI female never smoker followed for asthma complicated by allergic rhinitis, bronchitis, GERD, history atypical AFB, HBP, history brain tumor/adrenal insufficiency, CVA, fall w facial fx and SDH 07/2016 Allergy vaccine- Dr Donneta Romberg Office Spirometry 12/19/2015- WNL-FVC 1.93/82%, FEV1 1.50/86%, FEV1/FVC 0.77, FEF 25-75 percent 1.31/111%. ---------------------------------------------------------------------------  08/24/2016- 83 year old female never smoker followed for asthma complicated by allergic rhinitis, bronchitis, GERD, history atypical AFB, history brain tumor/adrenal insufficiency, CVA FOLLOWS FOR: Pt states she continues to cough since being seen by SG 07-09-16;denies any wheezing,   Husband here CT head 07/26/2016- SDH , s/p craniotomy w hemorrhagic sinus effusions due to bilat midface fxs- had fallen/ ED visit. CXR 07/23/2016-: No active cardiopulmonary disease. [place] influenza A 06/2016. We discussed her fall and facial injuries. She is still not allowed chew. Still having a little bloody postnasal drip but she is gone back to using Flonase. Not coughing here that has had some cough occasionally at home-nonproductive. Feels her breathing is well controlled. HCTZ was added for mild ankle edema. She continues prednisone 10 mg daily which has significantly stabilized her asthmatic bronchitis.  02/23/17- 83 year old female never smoker followed for asthma complicated by allergic rhinitis, bronchitis, GERD, history atypical AFB, history brain tumor/adrenal insufficiency, CVA Asthma with Bronchitis: reports breathing is at baseline.  no new complaints Here with husband. Left arm is in a sling after another fall, injuring left shoulder. Has had flu vaccine. Cough occasionally productive of some green sputum, most days dry or white. No fever or sweats. Occasionally feels somewhat hot at night but generally sleeping  well and feeling pretty well.  ROS-see HPI  + = positive Constitutional:   No-   weight loss, night sweats, fevers, chills, fatigue, lassitude. HEENT:   No-  headaches, difficulty swallowing, tooth/dental problems, sore throat,    itching, ear ache, nasal congestion, + nasal drip,  CV:  No-   chest pain, orthopnea, PND, swelling in lower extremities, anasarca, dizziness, palpitations Resp: +shortness of breath with exertion or at rest.             + productive cough, no- non-productive cough,  No- coughing up of blood.              +  change in color of mucus.  No- wheezing.   Skin: No-   rash or lesions. GI:  No-   heartburn, indigestion, abdominal pain, nausea, vomiting,  GU:  MS:  No-   joint pain or swelling.  . Neuro-     +dizzy since ophthalmic artery stroke Psych:  No- change in mood or affect. No depression or anxiety.  No memory loss.  OBJ- Physical Exam General- Alert, Oriented, Affect-appropriate, Distress- none acute.  Skin- rash-none, lesions- none, excoriation- none Lymphadenopathy- none Head- atraumatic            Eyes- Gross vision intact, PERRLA, conjunctivae and secretions clear            Ears- Hearing, canals-normal            Nose- Clear, no-Septal dev, mucus, polyps, erosion, perforation             Throat- Mallampati II , mucosa clear , drainage- none, tonsils- atrophic. Neck- flexible , trachea midline, stridor-none, thyroid nl, carotid no bruit Chest - symmetrical excursion , unlabored           Heart/CV- RRR  , no murmur , no gallop  ,  no rub, nl s1 s2                           - JVD- none , edema- none, stasis changes- none, varices- none           Lung-  Clear, wheeze-none, cough + light , dullness-none, rub- none,           Chest wall-  Abd-  Br/ Gen/ Rectal- Not done, not indicated Extrem- cyanosis- none, clubbing, none, atrophy- none, strength- nl, + left arm in sling Neuro- grossly intact to observation. Speech is clear and she is alert and  oriented

## 2017-02-23 NOTE — Assessment & Plan Note (Signed)
I'm not convinced she has an active bacterial infection now. We discussed symptom management. Plan-standby prescription for amoxicillin to hold

## 2017-02-23 NOTE — Patient Instructions (Signed)
Script sent for amoxacillin to hold  Please call if we can help

## 2017-03-01 ENCOUNTER — Ambulatory Visit: Payer: Medicare Other | Admitting: Cardiology

## 2017-03-03 ENCOUNTER — Encounter: Payer: Self-pay | Admitting: Osteopathic Medicine

## 2017-03-03 ENCOUNTER — Ambulatory Visit (INDEPENDENT_AMBULATORY_CARE_PROVIDER_SITE_OTHER): Payer: Medicare Other | Admitting: Osteopathic Medicine

## 2017-03-03 VITALS — BP 131/72 | HR 92 | Wt 136.0 lb

## 2017-03-03 DIAGNOSIS — F0390 Unspecified dementia without behavioral disturbance: Secondary | ICD-10-CM | POA: Diagnosis not present

## 2017-03-03 DIAGNOSIS — R350 Frequency of micturition: Secondary | ICD-10-CM

## 2017-03-03 DIAGNOSIS — I10 Essential (primary) hypertension: Secondary | ICD-10-CM | POA: Diagnosis not present

## 2017-03-03 DIAGNOSIS — F0280 Dementia in other diseases classified elsewhere without behavioral disturbance: Secondary | ICD-10-CM | POA: Insufficient documentation

## 2017-03-03 DIAGNOSIS — R413 Other amnesia: Secondary | ICD-10-CM | POA: Diagnosis not present

## 2017-03-03 MED ORDER — DONEPEZIL HCL 5 MG PO TABS: 5.0000 mg | ORAL_TABLET | Freq: Every day | ORAL | 2 refills | Status: DC

## 2017-03-03 NOTE — Patient Instructions (Addendum)
Plan:  Exercise and increase mental activity to preserve brain function, I'm going to start a medication as well which may help preserve memory  I think we are looking at possible effects from brain injury due to fall and concussion, effects of brain weakening over time due to age and high blood pressure/high cholesterol, or early dementia. Memory may or may not get worse, I have no way to predict this.   Continue current medication plus the new one I'm sending in

## 2017-03-03 NOTE — Progress Notes (Signed)
HPI: Sierra [name] is a 83 y.o. female  who presents to [facility] today, 03/03/17,  for chief complaint of:  Chief Complaint  Patient presents with  . Other    memory testing/ her blood pressure cuff was 167/69 [phone]    Family has been worried about memory issues and medications, as well as blood pressure problems. Patient herself doesn't seem too worried about memory issues though she does admit that she has had a hard time remembering things and she probably frustrates her family members, she herself becomes frustrated when she can't remember.  No wandering or violent behavior, no delusions.  Patient is accompanied by husband who assists with history-taking.   Past medical history, surgical history, social history and family history reviewed.  Patient Active Problem List   Diagnosis Date Noted  . Memory loss 12/17/2016  . Gait abnormality 12/17/2016  . CSF rhinorrhea 11/12/2016  . Edema extremities 07/13/2016  . Influenza due to identified novel influenza A virus with other respiratory manifestations 06/11/2016  . Influenza A with respiratory manifestations 06/11/2016  . Dyspnea on exertion 12/04/2015  . Anemia 12/12/2014  . Anxiety 12/12/2014  . Asthma 12/12/2014  . Chronic kidney disease, stage II (mild) 12/12/2014  . H/O: GI bleed 12/12/2014  . Postural lightheadedness 12/12/2014  . 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 Adrenal insufficiency ([place]) 01/03/2014  . Ulcerative colitis, chronic ([place]) 01/03/2014  . Meningioma ([place]) 06/08/2013  . Acute blood loss anemia 01/06/2013  . Malaise and fatigue 09/21/2011  . Benign hypertensive heart disease without congestive heart failure 09/21/2011  . Atrial premature contractions 09/21/2011  . Osteoarthritis of hip 07/27/2011  . DYSPNEA ON EXERTION 05/30/2010  . Hypothyroidism 05/01/2010  . Ulcerative colitis ([place]) 03/02/2010  . HOARSENESS, CHRONIC 11/05/2009  . Asthmatic  bronchitis, mild persistent, uncomplicated 31/54/0086  . Hyperlipidemia 07/11/2007  . ANEMIA, IRON DEFICIENCY, MICROCYTIC 07/11/2007  . LEUKOCYTOSIS 07/11/2007  . THROMBOCYTOSIS 07/11/2007  . Essential hypertension 07/11/2007  . PHARYNGITIS, CHRONIC 07/11/2007  . Nonallergic vasomotor rhinitis 07/11/2007  . Esophageal reflux 07/11/2007  . DIVERTICULITIS, COLON 07/11/2007  . ARTHRITIS 07/11/2007  . CARDIAC MURMUR 07/11/2007  . Diverticulitis of colon 07/11/2007    Current medication list and allergy/intolerance information reviewed.   Current Outpatient Prescriptions on File Prior to Visit  Medication Sig Dispense Refill  . acetaminophen (TYLENOL) 325 MG tablet Take 325-650 mg by mouth every 6 (six) hours as needed (for headaches or pain).    Marland Kitchen albuterol (PROVENTIL HFA;VENTOLIN HFA) 108 (90 Base) MCG/ACT inhaler Inhale 2 puffs into the lungs every 6 (six) hours as needed for wheezing or shortness of breath. 1 Inhaler 5  . amLODipine (NORVASC) 2.5 MG tablet Take 2.5 mg by mouth daily.    . Artificial Tear Solution (BION TEARS) 0.1-0.3 % SOLN Place 1 drop into both eyes every morning.     . Artificial Tear Solution (GENTEAL TEARS) 0.1-0.2-0.3 % SOLN Place 1 drop into both eyes at bedtime.    . B Complex Vitamins (VITAMIN B COMPLEX PO) Take 1 tablet by mouth daily.    Marland Kitchen BIOTIN PO Take 1 tablet by mouth daily.    . Calcium Carbonate-Vitamin D3 (CALCIUM 600-D) 600-400 MG-UNIT TABS Take 1 tablet by mouth daily.    . cholecalciferol (VITAMIN D) 1000 UNITS tablet Take 1,000 Units by mouth daily.    . Cyanocobalamin (VITAMIN B-12) 2500 MCG SUBL Take 2,500 mcg by mouth daily.     Marland Kitchen escitalopram (LEXAPRO) 5 MG tablet Take  5 mg by mouth daily.    Marland Kitchen HYDROcodone-acetaminophen (NORCO/VICODIN) 5-325 MG tablet Take 1 tablet by mouth every 4 (four) hours as needed for moderate pain or severe pain. 18 tablet 0  . lactase (LACTAID) 3000 UNITS tablet Take 3,000 Units by mouth daily as needed (lactose  intolerant).    Marland Kitchen levothyroxine (SYNTHROID, LEVOTHROID) 75 MCG tablet Take 1 tablet (75 mcg total) by mouth daily before breakfast. 90 tablet 3  . mesalamine (LIALDA) 1.2 g EC tablet Take 2.4 g by mouth daily.    . Multiple Vitamin (MULTIVITAMIN) tablet Take 1 tablet by mouth daily.    . predniSONE (DELTASONE) 10 MG tablet Take 10 mg by mouth daily with breakfast.    . Probiotic Product (PROBIOTIC DAILY PO) Take 1 capsule by mouth daily.     . ranitidine (ZANTAC) 150 MG tablet Take 150 mg by mouth 2 (two) times daily.     . traMADol (ULTRAM) 50 MG tablet Take 50 mg by mouth daily as needed for pain.    . [DISCONTINUED] Alum & Mag Hydroxide-Simeth (MAGIC MOUTHWASH) SOLN Take 5 mLs by mouth 3 (three) times daily as needed. Thrush      No current facility-administered medications on file prior to visit.    Allergies  Allergen Reactions  . Sulfamethoxazole Nausea And Vomiting  . Tape Other (See Comments)    TAPE TEARS AND BRUISES THE SKIN!!  . Biaxin [Clarithromycin] Other (See Comments)    Bitter taste  . Ciprofloxacin Rash  . Codeine Nausea And Vomiting  . Dilantin [Phenytoin] Rash  . Doxycycline Nausea Only    Very nauseated  . Lactose Intolerance (Gi) Other (See Comments)    gas  . Restasis [Cyclosporine] Other (See Comments) and Swelling    Eyes burn   . Sulfonamide Derivatives Rash      Review of Systems:  Constitutional: No recent illness  Cardiac: No  chest pain  Respiratory:  No  shortness of breath.   Neurologic: No  weakness, No  Dizziness  Psychiatric: No  concerns with depression, No  concerns with anxiety  Exam:  BP 131/72   Pulse 92   Wt 136 lb (61.7 kg)   BMI 23.71 kg/m   Constitutional: VS see above. General Appearance: alert, well-developed, well-nourished, NAD  Eyes: Normal lids and conjunctive, non-icteric sclera  Ears, Nose, Mouth, Throat: MMM, Normal external inspection ears/nares/mouth/lips/gums.  Neck: No masses, trachea midline.    Respiratory: Normal respiratory effort. no wheeze, no rhonchi, no rales  Cardiovascular: S1/S2 normal, no murmur, no rub/gallop auscultated. RRR.   Musculoskeletal: Gait normal.   Neurological: Normal balance/coordination. No tremor.  Skin: warm, dry, intact.   Psychiatric: Normal judgment/insight. Normal mood and affect. Oriented x3.     Montreal Cognitive Assessment  03/03/2017  Visuospatial/ Executive (0/5) 2  Naming (0/3) 3  Attention: Read list of digits (0/2) 2  Attention: Read list of letters (0/1) 1  Attention: Serial 7 subtraction starting at 100 (0/3) 2  Language: Repeat phrase (0/2) 1  Language : Fluency (0/1) 1  Abstraction (0/2) 2  Delayed Recall (0/5) 1  Orientation (0/6) 5  Total 20  Adjusted Score (based on education) 20     ASSESSMENT/PLAN:   Dementia without behavioral disturbance, unspecified dementia type - and problems especially with visuospatial/executive function. Most ADL's instact, though she requires assistance w/ some. Certainly borderline for MCI/Dementia  Memory loss  Essential hypertension    Patient Instructions  Plan:  Exercise and increase mental activity to preserve  brain function, I'm going to start a medication as well which may help preserve memory  I think we are looking at possible effects from brain injury due to fall and concussion, effects of brain weakening over time due to age and high blood pressure/high cholesterol, or early dementia. Memory may or may not get worse, I have no way to predict this.   Continue current medication plus the new one I'm sending in     Follow-up plan: Return in about 3 months (around 06/03/2017) for recheck memory and medications, sooner if needed.  Visit summary with medication list and pertinent instructions was printed for patient to review, alert [place] if any changes needed. All questions at time of visit were answered - patient instructed to contact office with any additional concerns.  ER/RTC precautions were reviewed with the patient and understanding verbalized.   Note: Total time spent 40 minutes, greater than 50% of the visit was spent face-to-face counseling and coordinating care for the following: The primary encounter diagnosis was Mild cognitive impairment with memory loss. Diagnoses of Memory loss and Essential hypertension were also pertinent to this visit.Marland Kitchen

## 2017-03-05 ENCOUNTER — Other Ambulatory Visit: Payer: Self-pay | Admitting: Osteopathic Medicine

## 2017-03-05 MED ORDER — MIRABEGRON ER 25 MG PO TB24: 25.0000 mg | ORAL_TABLET | Freq: Every day | ORAL | 1 refills | Status: DC

## 2017-03-05 NOTE — Progress Notes (Signed)
[email or web address] adding

## 2017-03-06 ENCOUNTER — Emergency Department (HOSPITAL_COMMUNITY): Payer: Medicare Other

## 2017-03-06 ENCOUNTER — Encounter (HOSPITAL_COMMUNITY): Payer: Self-pay | Admitting: *Deleted

## 2017-03-06 ENCOUNTER — Observation Stay (HOSPITAL_COMMUNITY)
Admission: EM | Admit: 2017-03-06 | Discharge: 2017-03-07 | Disposition: A | Discharge status: Home / Self Care | Payer: Medicare Other | Attending: Internal Medicine | Admitting: Internal Medicine

## 2017-03-06 DIAGNOSIS — Z888 Allergy status to other drugs, medicaments and biological substances status: Secondary | ICD-10-CM | POA: Diagnosis not present

## 2017-03-06 DIAGNOSIS — I1310 Hypertensive heart and chronic kidney disease without heart failure, with stage 1 through stage 4 chronic kidney disease, or unspecified chronic kidney disease: Secondary | ICD-10-CM | POA: Insufficient documentation

## 2017-03-06 DIAGNOSIS — Z791 Long term (current) use of non-steroidal anti-inflammatories (NSAID): Secondary | ICD-10-CM | POA: Insufficient documentation

## 2017-03-06 DIAGNOSIS — E785 Hyperlipidemia, unspecified: Secondary | ICD-10-CM | POA: Insufficient documentation

## 2017-03-06 DIAGNOSIS — F0390 Unspecified dementia without behavioral disturbance: Secondary | ICD-10-CM | POA: Insufficient documentation

## 2017-03-06 DIAGNOSIS — W1839XA Other fall on same level, initial encounter: Secondary | ICD-10-CM | POA: Insufficient documentation

## 2017-03-06 DIAGNOSIS — E039 Hypothyroidism, unspecified: Secondary | ICD-10-CM | POA: Insufficient documentation

## 2017-03-06 DIAGNOSIS — M1990 Unspecified osteoarthritis, unspecified site: Secondary | ICD-10-CM | POA: Diagnosis not present

## 2017-03-06 DIAGNOSIS — S0101XA Laceration without foreign body of scalp, initial encounter: Secondary | ICD-10-CM | POA: Diagnosis not present

## 2017-03-06 DIAGNOSIS — S42212A Unspecified displaced fracture of surgical neck of left humerus, initial encounter for closed fracture: Secondary | ICD-10-CM | POA: Insufficient documentation

## 2017-03-06 DIAGNOSIS — S060X0A Concussion without loss of consciousness, initial encounter: Secondary | ICD-10-CM | POA: Diagnosis not present

## 2017-03-06 DIAGNOSIS — F0781 Postconcussional syndrome: Secondary | ICD-10-CM

## 2017-03-06 DIAGNOSIS — I10 Essential (primary) hypertension: Secondary | ICD-10-CM | POA: Diagnosis not present

## 2017-03-06 DIAGNOSIS — W19XXXA Unspecified fall, initial encounter: Secondary | ICD-10-CM

## 2017-03-06 DIAGNOSIS — N182 Chronic kidney disease, stage 2 (mild): Secondary | ICD-10-CM | POA: Insufficient documentation

## 2017-03-06 DIAGNOSIS — K589 Irritable bowel syndrome without diarrhea: Secondary | ICD-10-CM | POA: Diagnosis not present

## 2017-03-06 DIAGNOSIS — Y9289 Other specified places as the place of occurrence of the external cause: Secondary | ICD-10-CM | POA: Insufficient documentation

## 2017-03-06 DIAGNOSIS — Z882 Allergy status to sulfonamides status: Secondary | ICD-10-CM | POA: Insufficient documentation

## 2017-03-06 DIAGNOSIS — F419 Anxiety disorder, unspecified: Secondary | ICD-10-CM | POA: Insufficient documentation

## 2017-03-06 DIAGNOSIS — F329 Major depressive disorder, single episode, unspecified: Secondary | ICD-10-CM | POA: Diagnosis not present

## 2017-03-06 DIAGNOSIS — K219 Gastro-esophageal reflux disease without esophagitis: Secondary | ICD-10-CM | POA: Diagnosis not present

## 2017-03-06 LAB — I-STAT CHEM 8, ED
BUN: 18 mg/dL (ref 6–20)
CREATININE: 1.1 mg/dL — AB (ref 0.44–1.00)
Calcium, Ion: 1.28 mmol/L (ref 1.15–1.40)
Chloride: 100 mmol/L — ABNORMAL LOW (ref 101–111)
Glucose, Bld: 164 mg/dL — ABNORMAL HIGH (ref 65–99)
HCT: 36 % (ref 36.0–46.0)
Hemoglobin: 12.2 g/dL (ref 12.0–15.0)
Potassium: 4 mmol/L (ref 3.5–5.1)
Sodium: 137 mmol/L (ref 135–145)
TCO2: 28 mmol/L (ref 22–32)

## 2017-03-06 LAB — CBC WITH DIFFERENTIAL/PLATELET
Basophils Absolute: 0 10*3/uL (ref 0.0–0.1)
Basophils Relative: 0 %
Eosinophils Absolute: 0 10*3/uL (ref 0.0–0.7)
Eosinophils Relative: 0 %
HEMATOCRIT: 37.3 % (ref 36.0–46.0)
HEMOGLOBIN: 12 g/dL (ref 12.0–15.0)
LYMPHS ABS: 1 10*3/uL (ref 0.7–4.0)
LYMPHS PCT: 7 %
MCH: 28.2 pg (ref 26.0–34.0)
MCHC: 32.2 g/dL (ref 30.0–36.0)
MCV: 87.6 fL (ref 78.0–100.0)
MONOS PCT: 5 %
Monocytes Absolute: 0.7 10*3/uL (ref 0.1–1.0)
NEUTROS ABS: 12 10*3/uL — AB (ref 1.7–7.7)
NEUTROS PCT: 88 %
Platelets: 336 10*3/uL (ref 150–400)
RBC: 4.26 MIL/uL (ref 3.87–5.11)
RDW: 14.6 % (ref 11.5–15.5)
WBC: 13.8 10*3/uL — ABNORMAL HIGH (ref 4.0–10.5)

## 2017-03-06 MED ORDER — SODIUM CHLORIDE 0.9 % IV SOLN
INTRAVENOUS | Status: DC
  Administered 2017-03-06: 21:00:00 via INTRAVENOUS

## 2017-03-06 MED ORDER — PREDNISONE 20 MG PO TABS
10.0000 mg | ORAL_TABLET | Freq: Every day | ORAL | Status: DC
  Administered 2017-03-07: 10 mg via ORAL
  Filled 2017-03-06: qty 1

## 2017-03-06 MED ORDER — ONDANSETRON 4 MG PO TBDP
4.0000 mg | ORAL_TABLET | Freq: Once | ORAL | Status: AC
  Administered 2017-03-06: 4 mg via ORAL
  Filled 2017-03-06: qty 1

## 2017-03-06 MED ORDER — MESALAMINE 1.2 G PO TBEC
2.4000 g | DELAYED_RELEASE_TABLET | Freq: Every day | ORAL | Status: DC
  Administered 2017-03-07: 2.4 g via ORAL
  Filled 2017-03-06: qty 2

## 2017-03-06 MED ORDER — VITAMIN D3 25 MCG (1000 UNIT) PO TABS
1000.0000 [IU] | ORAL_TABLET | Freq: Every day | ORAL | Status: DC
  Administered 2017-03-07: 1000 [IU] via ORAL
  Filled 2017-03-06: qty 1

## 2017-03-06 MED ORDER — AMLODIPINE BESYLATE 5 MG PO TABS
2.5000 mg | ORAL_TABLET | Freq: Every day | ORAL | Status: DC
  Administered 2017-03-07: 2.5 mg via ORAL
  Filled 2017-03-06: qty 1

## 2017-03-06 MED ORDER — MAGNESIUM CITRATE PO SOLN: 1.0000 | Freq: Once | ORAL | Status: DC | PRN

## 2017-03-06 MED ORDER — FAMOTIDINE 20 MG PO TABS
20.0000 mg | ORAL_TABLET | Freq: Two times a day (BID) | ORAL | Status: DC
Start: 2017-03-06 — End: 2017-03-07
  Administered 2017-03-06 – 2017-03-07 (×2): 20 mg via ORAL
  Filled 2017-03-06 (×2): qty 1

## 2017-03-06 MED ORDER — ALBUTEROL SULFATE (2.5 MG/3ML) 0.083% IN NEBU: 3.0000 mL | INHALATION_SOLUTION | Freq: Four times a day (QID) | RESPIRATORY_TRACT | Status: DC | PRN

## 2017-03-06 MED ORDER — VITAMIN B-12 2500 MCG SL SUBL: 2500.0000 ug | SUBLINGUAL_TABLET | Freq: Every day | SUBLINGUAL | Status: DC

## 2017-03-06 MED ORDER — POLYVINYL ALCOHOL 1.4 % OP SOLN
1.0000 [drp] | Freq: Two times a day (BID) | OPHTHALMIC | Status: DC
  Administered 2017-03-06 – 2017-03-07 (×2): 1 [drp] via OPHTHALMIC
  Filled 2017-03-06: qty 15

## 2017-03-06 MED ORDER — ACETAMINOPHEN 500 MG PO TABS: 500.0000 mg | ORAL_TABLET | Freq: Four times a day (QID) | ORAL | Status: DC | PRN

## 2017-03-06 MED ORDER — ONDANSETRON HCL 4 MG/2ML IJ SOLN: 4.0000 mg | Freq: Four times a day (QID) | INTRAMUSCULAR | Status: DC | PRN

## 2017-03-06 MED ORDER — IPRATROPIUM BROMIDE 0.02 % IN SOLN: 0.5000 mg | Freq: Four times a day (QID) | RESPIRATORY_TRACT | Status: DC | PRN

## 2017-03-06 MED ORDER — VITAMIN B-12 1000 MCG PO TABS
2500.0000 ug | ORAL_TABLET | Freq: Every day | ORAL | Status: DC
  Administered 2017-03-07: 2500 ug via ORAL
  Filled 2017-03-06: qty 3

## 2017-03-06 MED ORDER — ONDANSETRON HCL 4 MG PO TABS: 4.0000 mg | ORAL_TABLET | Freq: Four times a day (QID) | ORAL | Status: DC | PRN

## 2017-03-06 MED ORDER — MECLIZINE HCL 25 MG PO TABS: 25.0000 mg | ORAL_TABLET | Freq: Three times a day (TID) | ORAL | Status: DC | PRN

## 2017-03-06 MED ORDER — ENOXAPARIN SODIUM 40 MG/0.4ML ~~LOC~~ SOLN
40.0000 mg | SUBCUTANEOUS | Status: DC
  Administered 2017-03-06: 40 mg via SUBCUTANEOUS
  Filled 2017-03-06: qty 0.4

## 2017-03-06 MED ORDER — LACTASE 3000 UNITS PO TABS
3000.0000 [IU] | ORAL_TABLET | Freq: Every day | ORAL | Status: DC | PRN
  Filled 2017-03-06: qty 1

## 2017-03-06 MED ORDER — ESCITALOPRAM OXALATE 10 MG PO TABS
5.0000 mg | ORAL_TABLET | Freq: Every day | ORAL | Status: DC
  Administered 2017-03-07: 5 mg via ORAL
  Filled 2017-03-06: qty 1

## 2017-03-06 MED ORDER — CALCIUM CARBONATE-VITAMIN D3 600-400 MG-UNIT PO TABS: 1.0000 | ORAL_TABLET | Freq: Every day | ORAL | Status: DC

## 2017-03-06 MED ORDER — LIDOCAINE-EPINEPHRINE (PF) 2 %-1:200000 IJ SOLN
10.0000 mL | Freq: Once | INTRAMUSCULAR | Status: AC
  Administered 2017-03-06: 10 mL
  Filled 2017-03-06: qty 20

## 2017-03-06 MED ORDER — BION TEARS 0.1-0.3 % OP SOLN
1.0000 [drp] | Freq: Every morning | OPHTHALMIC | Status: DC
Start: 2017-03-07 — End: 2017-03-06

## 2017-03-06 MED ORDER — BISACODYL 5 MG PO TBEC: 5.0000 mg | DELAYED_RELEASE_TABLET | Freq: Every day | ORAL | Status: DC | PRN

## 2017-03-06 MED ORDER — CALCIUM CARBONATE-VITAMIN D 500-200 MG-UNIT PO TABS
1.0000 | ORAL_TABLET | Freq: Every day | ORAL | Status: DC
  Administered 2017-03-07: 08:00:00 1 via ORAL
  Filled 2017-03-06: qty 1

## 2017-03-06 MED ORDER — SENNOSIDES-DOCUSATE SODIUM 8.6-50 MG PO TABS: 1.0000 | ORAL_TABLET | Freq: Every evening | ORAL | Status: DC | PRN

## 2017-03-06 MED ORDER — LEVOTHYROXINE SODIUM 75 MCG PO TABS
75.0000 ug | ORAL_TABLET | Freq: Every day | ORAL | Status: DC
  Administered 2017-03-07: 75 ug via ORAL
  Filled 2017-03-06: qty 1

## 2017-03-06 NOTE — ED Provider Notes (Signed)
[facility] DEPT Provider Note   CSN: [id number] Arrival date & time: 03/06/17  1401     History   Chief Complaint Chief Complaint  Patient presents with  . Fall    HPI Sierra [name] is a 83 y.o. female.  Patient is an 83 year old female with a history of asthma, respiratory failure, balance problems, chronic kidney disease, GI bleed, hypertension, and dementia presenting today after a fall.  Patient denies feeling lightheaded or dizzy prior to the fall.  She denies headache but with moving in the bed she did start to feel nauseated.  She states that is not completely unusual for her.  She still denies headache, vision changes.  She had a fall 6 weeks ago where she fractured her left humerus and states that her arm is still hurting her but nothing else hurts from the fall today.  She states her arm does not hurt any worse today than it did prior to the fall.  Patient cannot remember the circumstances of her fall and [name] have to corroborate with her husband.  Tetanus shot is up-to-date.  Pt's husband arrived and states she was sitting in a high chair at the [employer] and the chair broke causing her to fall backward hitting her head on a table.  Husband denies her having loss of consciousness.   The history is provided by the patient.  Fall     Past Medical History:  Diagnosis Date  . Acute asthmatic bronchitis   . Acute respiratory failure with hypoxia ([place]) 06/11/2016  . Allergic rhinitis   . Arthritis    Hx R shoulder bursitis, pain L knee and L hip  . Asthmatic bronchitis   . ATYPICAL MYCOBACTERIAL INFECTION 05/05/2010   Sputum cx Pos 12/ 2011    . Balance problem    SINCE BRAIN TUMOR REMOVED IN 2002-BENIGN TUMOR-PT HAS ADRENAL INSUFFICIENCY AND TAKES DAILY PREDNISONE  . Blood transfusion   . Cardiac murmur    DOES NOT CAUSE ANY SYMPTOMS  . Chronic kidney disease   . Chronic pharyngitis   . Colonic diverticular abscess 02/01/2017  .  Diverticulitis, colon   . Dizziness   . DOE (dyspnea on exertion)   . Esophageal reflux   . GI bleed 01/06/2013  . Hyperlipemia   . Hypertension   . Hypothyroidism   . IBS (irritable bowel syndrome)   . Leukocytosis   . LGI bleed 01/05/2013  . Other specified iron deficiency anemias   . PNEUMONIA 06/02/2010   Qualifier: Diagnosis of  By: Annamaria Boots MD, [place] D   . Postop Hyponatremia 07/28/2011  . Rectal bleeding 01/03/2014  . Recurrent upper respiratory infection (URI) 07/10/2011    ACUTE BRONCHITIS - extra prednisone in addition to the daily prednisone and a Z-Pak  . Thrombocytosis ([place])   . THRUSH 04/30/2009   Qualifier: Diagnosis of  By: Annamaria Boots MD, [place] D   . Ulceration, colon   . Ulcerative colitis     Patient Active Problem List   Diagnosis Date Noted  . Dementia without behavioral disturbance 03/03/2017  . Memory loss 12/17/2016  . Gait abnormality 12/17/2016  . CSF rhinorrhea 11/12/2016  . Edema extremities 07/13/2016  . Influenza due to identified novel influenza A virus with other respiratory manifestations 06/11/2016  . Influenza A with respiratory manifestations 06/11/2016  . Dyspnea on exertion 12/04/2015  . Anemia 12/12/2014  . Anxiety 12/12/2014  . Asthma 12/12/2014  . Chronic kidney disease, stage II (mild) 12/12/2014  . H/O:  GI bleed 12/12/2014  . Postural lightheadedness 12/12/2014  . 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 Adrenal insufficiency ([place]) 01/03/2014  . Ulcerative colitis, chronic ([place]) 01/03/2014  . Meningioma ([place]) 06/08/2013  . Acute blood loss anemia 01/06/2013  . Malaise and fatigue 09/21/2011  . Benign hypertensive heart disease without congestive heart failure 09/21/2011  . Atrial premature contractions 09/21/2011  . Osteoarthritis of hip 07/27/2011  . DYSPNEA ON EXERTION 05/30/2010  . Hypothyroidism 05/01/2010  . Ulcerative colitis ([place]) 03/02/2010  . HOARSENESS, CHRONIC 11/05/2009  . Asthmatic bronchitis, mild persistent,  uncomplicated 16/02/9603  . Hyperlipidemia 07/11/2007  . ANEMIA, IRON DEFICIENCY, MICROCYTIC 07/11/2007  . LEUKOCYTOSIS 07/11/2007  . THROMBOCYTOSIS 07/11/2007  . Essential hypertension 07/11/2007  . PHARYNGITIS, CHRONIC 07/11/2007  . Nonallergic vasomotor rhinitis 07/11/2007  . Esophageal reflux 07/11/2007  . DIVERTICULITIS, COLON 07/11/2007  . ARTHRITIS 07/11/2007  . CARDIAC MURMUR 07/11/2007  . Diverticulitis of colon 07/11/2007    Past Surgical History:  Procedure Laterality Date  . ABDOMINAL HYSTERECTOMY  1985  . APPENDECTOMY    . COLONOSCOPY N/A 01/05/2013   Procedure: COLONOSCOPY;  Surgeon: Cleotis Nipper, MD;  Location: [facility] ENDOSCOPY;  Service: Endoscopy;  Laterality: N/A;  . COLONOSCOPY N/A 01/04/2014   Procedure: COLONOSCOPY;  Surgeon: Winfield Cunas., MD;  Location: [facility] ENDOSCOPY;  Service: Endoscopy;  Laterality: N/A;  . CRANIOTOMY    . DILATION AND CURETTAGE OF UTERUS  1967  . ESOPHAGOGASTRODUODENOSCOPY N/A 01/05/2013   Procedure: ESOPHAGOGASTRODUODENOSCOPY (EGD);  Surgeon: Cleotis Nipper, MD;  Location[facility] ENDOSCOPY;  Service: Endoscopy;  Laterality: N/A;  . ESOPHAGOGASTRODUODENOSCOPY N/A 06/26/2014   Procedure: ESOPHAGOGASTRODUODENOSCOPY (EGD);  Surgeon: Winfield Cunas., MD;  Location: [facility] ENDOSCOPY;  Service: Endoscopy;  Laterality: N/A;  . EYE SURGERY     2003 -RIGHT CATARACT EXTRACTED AND LEFT WAS DONE IN 2004  . FLEXIBLE SIGMOIDOSCOPY N/A 06/26/2014   Procedure: FLEXIBLE SIGMOIDOSCOPY;  Surgeon: Winfield Cunas., MD;  Location: [facility] ENDOSCOPY;  Service: Endoscopy;  Laterality: N/A;  . FRONTALIS SUSPENSION  10-09-2010   lifting eyelids AND SECOND EYE SURGERY November 19, 2010  . meningioma resected  20O2  . PARTIAL COLECTOMY  2008  . subglottal mucocoel  2000  . THYROIDECTOMY  1986  . TONSILLECTOMY  1938  . TOTAL HIP ARTHROPLASTY  OCT 2006   right  . TOTAL HIP ARTHROPLASTY  07/27/2011   Procedure: TOTAL HIP ARTHROPLASTY;  Surgeon: Gearlean Alf, MD;   Location: [facility] ORS;  Service: Orthopedics;  Laterality: Left;  Marland Kitchen VESICOVAGINAL FISTULA CLOSURE W/ TAH    . WRIST TENDON LESION REMOVED 2007      OB History    No data available       Home Medications    Prior to Admission medications   Medication Sig Start Date End Date Taking? Authorizing Provider  acetaminophen (TYLENOL) 325 MG tablet Take 325-650 mg by mouth every 6 (six) hours as needed (for headaches or pain).    [provider]  albuterol (PROVENTIL HFA;VENTOLIN HFA) 108 (90 Base) MCG/ACT inhaler Inhale 2 puffs into the lungs every 6 (six) hours as needed for wheezing or shortness of breath. 06/12/16 10/02/20  Baird Lyons D, MD  amLODipine (NORVASC) 2.5 MG tablet Take 2.5 mg by mouth daily.    [provider]  Artificial Tear Solution (BION TEARS) 0.1-0.3 % SOLN Place 1 drop into both eyes every morning.     [provider]  Artificial Tear Solution (GENTEAL TEARS) 0.1-0.2-0.3 % SOLN Place 1 drop  into both eyes at bedtime.    [provider]  B Complex Vitamins (VITAMIN B COMPLEX PO) Take 1 tablet by mouth daily.    [provider]  BIOTIN PO Take 1 tablet by mouth daily.    [provider]  Calcium Carbonate-Vitamin D3 (CALCIUM 600-D) 600-400 MG-UNIT TABS Take 1 tablet by mouth daily.    [provider]  cholecalciferol (VITAMIN D) 1000 UNITS tablet Take 1,000 Units by mouth daily.    [provider]  Cyanocobalamin (VITAMIN B-12) 2500 MCG SUBL Take 2,500 mcg by mouth daily.     [provider]  donepezil (ARICEPT) 5 MG tablet Take 1 tablet (5 mg total) by mouth at bedtime. 03/03/17   Emeterio Reeve, DO  escitalopram (LEXAPRO) 5 MG tablet Take 5 mg by mouth daily. 02/01/17   [provider]  HYDROcodone-acetaminophen (NORCO/VICODIN) 5-325 MG tablet Take 1 tablet by mouth every 4 (four) hours as needed for moderate pain or severe pain. 02/20/17   Forde Dandy, MD  lactase (LACTAID) 3000 UNITS  tablet Take 3,000 Units by mouth daily as needed (lactose intolerant).    [provider]  levothyroxine (SYNTHROID, LEVOTHROID) 75 MCG tablet Take 1 tablet (75 mcg total) by mouth daily before breakfast. 02/02/17   Emeterio Reeve, DO  mesalamine (LIALDA) 1.2 g EC tablet Take 2.4 g by mouth daily. 02/08/17   [provider]  mirabegron ER (MYRBETRIQ) 25 MG TB24 tablet Take 1 tablet (25 mg total) by mouth daily. 03/05/17   Emeterio Reeve, DO  Multiple Vitamin (MULTIVITAMIN) tablet Take 1 tablet by mouth daily.    [provider]  Multiple Vitamins-Minerals (CENTRUM SILVER 50+WOMEN PO) Take by mouth.    [provider]  Omega-3 Fatty Acids (FISH OIL) 1000 MG CAPS Take by mouth.    [provider]  predniSONE (DELTASONE) 10 MG tablet Take 10 mg by mouth daily with breakfast.    [provider]  Probiotic Product (PROBIOTIC DAILY PO) Take 1 capsule by mouth daily.     [provider]  ranitidine (ZANTAC) 150 MG tablet Take 150 mg by mouth 2 (two) times daily.     [provider]  traMADol (ULTRAM) 50 MG tablet Take 50 mg by mouth daily as needed for pain. 02/01/17   [provider]    Family History Family History  Problem Relation Age of Onset  . Heart attack Father        deceased    Social History Social History  Substance Use Topics  . Smoking status: Never Smoker  . Smokeless tobacco: Never Used  . Alcohol use Yes     Comment: occ glass of wine     Allergies   Sulfamethoxazole; Tape; Biaxin [clarithromycin]; Ciprofloxacin; Codeine; Dilantin [phenytoin]; Doxycycline; Lactose intolerance (gi); Restasis [cyclosporine]; and Sulfonamide derivatives   Review of Systems Review of Systems  All other systems reviewed and are negative.    Physical Exam Updated Vital Signs BP (!) 153/72 (BP Location: Right Arm)   Pulse 99   Temp 98.5 F (36.9 C) (Oral)   Resp 16   Ht 5\' 3"  (1.6 m)   Wt 61.2 kg  (135 lb)   SpO2 94%   BMI 23.91 kg/m   Physical Exam  Constitutional: She is oriented to person, place, and time. She appears well-developed and well-nourished. No distress.  HENT:  Head: Normocephalic. Head is with contusion.    Mouth/Throat: Oropharynx is clear and moist.  Eyes: Pupils are  equal, round, and reactive to light. Conjunctivae and EOM are normal.  Neck: Normal range of motion. Neck supple. No spinous process tenderness and no muscular tenderness present. Normal range of motion present.  Cardiovascular: Normal rate, regular rhythm and intact distal pulses.   No murmur heard. Pulmonary/Chest: Effort normal and breath sounds normal. No respiratory distress. She has no wheezes. She has no rales.  Abdominal: Soft. She exhibits no distension. There is no tenderness. There is no rebound and no guarding.  Musculoskeletal: She exhibits tenderness. She exhibits no edema.       Left shoulder: She exhibits decreased range of motion and tenderness.       Arms: Neurological: She is alert and oriented to person, place, and time.  Skin: Skin is warm and dry. No rash noted. No erythema.  Psychiatric: She has a normal mood and affect. Her behavior is normal.  Nursing note and vitals reviewed.    ED Treatments / Results  Labs (all labs ordered are listed, but only abnormal results are displayed) Labs Reviewed - No data to display  EKG  EKG Interpretation None       Radiology Ct Head Wo Contrast  Result Date: 03/06/2017 CLINICAL DATA:  Headache, posttraumatic. Initial encounter. EXAM: CT HEAD WITHOUT CONTRAST TECHNIQUE: Contiguous axial images were obtained from the base of the skull through the vertex without intravenous contrast. COMPARISON:  07/26/2016 FINDINGS: Brain: Moderate atrophy. 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 Stable encephalomalacia deep to the craniotomy site that has been noted since at least 2011. No acute infarct,  hemorrhage, or hydrocephalus. Vascular: Atherosclerotic calcification.  No hyperdense vessel. Skull: No acute or aggressive finding. Unremarkable left parietal craniotomy site. Sinuses/Orbits: No acute finding.  Bilateral cataract resection. IMPRESSION: 1. No evidence of intracranial injury. 2. Chronic findings are stable from prior and described above. Electronically Signed   By: Monte Fantasia M.D.   On: 03/06/2017 16:27   Dg Shoulder Left Portable  Result Date: 03/06/2017 CLINICAL DATA:  Pain after a fall EXAM: LEFT SHOULDER - 1 VIEW COMPARISON:  02/20/2017 FINDINGS: Re- demonstrated displaced fracture at the left humeral neck, similar alignment 1 allowing for differences in positioning. Bridging callus evident consistent with subacute process. No humeral head dislocation. IMPRESSION: Healing left humeral neck fracture without significant change in alignment allowing for differences in positioning Electronically Signed   By: Donavan Foil M.D.   On: 03/06/2017 17:03    Procedures Procedures (including critical care time) LACERATION REPAIR Performed by: Blanchie Dessert Authorized by: Blanchie Dessert Consent: Verbal consent obtained. Risks and benefits: risks, benefits and alternatives were discussed Consent given by: patient Patient identity confirmed: provided demographic data Prepped and Draped in normal sterile fashion Wound explored  Laceration Location: scalp  Laceration Length: 3cm  No Foreign Bodies seen or palpated  Anesthesia: local infiltration  Local anesthetic: lidocaine 2% with epinephrine  Anesthetic total: 5 ml  Irrigation method: syringe Amount of cleaning: standard  Skin closure: staples  Number of sutures: 3   Patient tolerance: Patient tolerated the procedure well with no immediate complications.   Medications Ordered in ED Medications  ondansetron (ZOFRAN-ODT) disintegrating tablet 4 mg (not administered)     Initial Impression / Assessment  and Plan / ED Course  I have reviewed the triage vital signs and the nursing notes.  Pertinent labs & imaging results that were available during my care of the patient were reviewed by me and considered in my medical decision making (see chart for details).  Elderly female with a mechanical fall today with laceration to the back of the head.  She denies any headache.  She is still having pain in her left shoulder where she had a humerus fracture but denies any new pain in the arms or legs.  Patient is able to lift her body off the bed with her legs and is able to sit up without difficulty.  She has no neck pain.  When moving around she does become nauseated and has minimal vomiting.  She denies any headache.  She does not take anticoagulation.  She does have a history of falls and balance issues.  CT of the head pending and imaging of the shoulder to ensure no new injury.   tetanus up-to-date.  Wound repaired as above.  4:50 PM Head CT without acute findings.  Shoulder film unchanged.  Pt [name] be ambulated but feeling much better and no longer nauseated.  5:32 PM Attempted to walk the patient and she once again became very lightheaded, dizzy and nauseated.  Close to vomiting.  This is all been since the fall.  Concerned that patient does have concussion from her fall.  She already has difficulty ambulating and balance issues.  Feel that patient would be at risk for going home for another fall.  Final Clinical Impressions(s) / ED Diagnoses   Final diagnoses:  Fall, initial encounter  Laceration of scalp, initial encounter  Concussion without loss of consciousness, initial encounter    New Prescriptions New Prescriptions   No medications on file     Blanchie Dessert, MD 03/06/17 [phone]

## 2017-03-06 NOTE — ED Notes (Signed)
Spoke with pt and her husband regarding staying in hospital tonight due to pt becoming dizzy and nauseated with any type of movement. They are in agreement it will be best if she stays, Dr Maryan Rued aware.

## 2017-03-06 NOTE — ED Notes (Signed)
Pt dizzy when setting up on side of bed, moved to chair with assist. She ambulated to door with assist. Upon going back to bed she become dizzy again.

## 2017-03-06 NOTE — H&P (Addendum)
History and Physical   [facility] Admission History and Physical [employer], D.O.    Patient Name: Sierra [name] MR#: [id number] Date of Birth: August 30, 1928 Date of Admission: 03/06/2017  Referring MD/NP/PA: Dr. Maryan Rued Primary Care Physician: Emeterio Reeve, DO  Chief Complaint:  Chief Complaint  Patient presents with  . Fall    HPI: Sierra [name] is a 83 y.o. female with a known history of bronchitis, HTN, HLD, IBS, UC presents to the emergency department for evaluation of head trauma s/p fall.  Patient was in a usual state of health until this afternoon when she was seated on a bench which broke underneath her.  She fell backwards hitting the back of her head on the floor.  There was no prodrome, preceding symptoms, loss of consciousness before or after the fall.   She normally ambulates independently with a cane.  Patient denies fevers/chills, weakness, dizziness, chest pain, shortness of breath, N/V/C/D, abdominal pain, dysuria/frequency, changes in mental status.    Otherwise there has been no change in status. Patient has been taking medication as prescribed and there has been no recent change in medication or diet.  No recent antibiotics.  There has been no recent illness, hospitalizations, travel or sick contacts.    EMS/ED Course: Patient received staples to a scalp lac.  In the emergency department, patient was asymptomatic at rest, but became dizzy, and nauseous with vomiting each time she tried to ambulate.   Medical admission has been requested for further management of post-concussive symptoms, dizziness, gait instability following head trauma.  Review of Systems:  CONSTITUTIONAL: No fever/chills, fatigue, weakness, weight gain/loss, headache. Positive dizziness.  EYES: No blurry or double vision. ENT: No tinnitus, postnasal drip, redness or soreness of the oropharynx. RESPIRATORY: No cough, dyspnea, wheeze.  No  hemoptysis.  CARDIOVASCULAR: No chest pain, palpitations, syncope, orthopnea. No lower extremity edema.  GASTROINTESTINAL: No nausea, vomiting, abdominal pain, diarrhea, constipation.  No hematemesis, melena or hematochezia. GENITOURINARY: No dysuria, frequency, hematuria. ENDOCRINE: No polyuria or nocturia. No heat or cold intolerance. HEMATOLOGY: No anemia, bruising, bleeding. INTEGUMENTARY: No rashes, ulcers, lesions. MUSCULOSKELETAL: No arthritis, gout, dyspnea. NEUROLOGIC: No numbness, tingling, seizure-type activity, weakness. Positive gait ataxia, imbalance. PSYCHIATRIC: No anxiety, depression, insomnia.   Past Medical History:  Diagnosis Date  . Acute asthmatic bronchitis   . Acute respiratory failure with hypoxia ([place]) 06/11/2016  . Allergic rhinitis   . Arthritis    Hx R shoulder bursitis, pain L knee and L hip  . Asthmatic bronchitis   . ATYPICAL MYCOBACTERIAL INFECTION 05/05/2010   Sputum cx Pos 12/ 2011    . Balance problem    SINCE BRAIN TUMOR REMOVED IN 2002-BENIGN TUMOR-PT HAS ADRENAL INSUFFICIENCY AND TAKES DAILY PREDNISONE  . Blood transfusion   . Cardiac murmur    DOES NOT CAUSE ANY SYMPTOMS  . Chronic kidney disease   . Chronic pharyngitis   . Colonic diverticular abscess 02/01/2017  . Diverticulitis, colon   . Dizziness   . DOE (dyspnea on exertion)   . Esophageal reflux   . GI bleed 01/06/2013  . Hyperlipemia   . Hypertension   . Hypothyroidism   . IBS (irritable bowel syndrome)   . Leukocytosis   . LGI bleed 01/05/2013  . Other specified iron deficiency anemias   . PNEUMONIA 06/02/2010   Qualifier: Diagnosis of  By: Annamaria Boots MD, [place] D   . Postop Hyponatremia 07/28/2011  . Rectal bleeding 01/03/2014  . Recurrent upper respiratory  infection (URI) 07/10/2011    ACUTE BRONCHITIS - extra prednisone in addition to the daily prednisone and a Z-Pak  . Thrombocytosis ([place])   . THRUSH 04/30/2009   Qualifier: Diagnosis of  By: Annamaria Boots MD, Clinton D   .  Ulceration, colon   . Ulcerative colitis     Past Surgical History:  Procedure Laterality Date  . ABDOMINAL HYSTERECTOMY  1985  . APPENDECTOMY    . COLONOSCOPY N/A 01/05/2013   Procedure: COLONOSCOPY;  Surgeon: Cleotis Nipper, MD;  Location: [facility] ENDOSCOPY;  Service: Endoscopy;  Laterality: N/A;  . COLONOSCOPY N/A 01/04/2014   Procedure: COLONOSCOPY;  Surgeon: Winfield Cunas., MD;  Location: [facility] ENDOSCOPY;  Service: Endoscopy;  Laterality: N/A;  . CRANIOTOMY    . DILATION AND CURETTAGE OF UTERUS  1967  . ESOPHAGOGASTRODUODENOSCOPY N/A 01/05/2013   Procedure: ESOPHAGOGASTRODUODENOSCOPY (EGD);  Surgeon: Cleotis Nipper, MD;  Location: [facility] ENDOSCOPY;  Service: Endoscopy;  Laterality: N/A;  . ESOPHAGOGASTRODUODENOSCOPY N/A 06/26/2014   Procedure: ESOPHAGOGASTRODUODENOSCOPY (EGD);  Surgeon: Winfield Cunas., MD;  Location[facility] ENDOSCOPY;  Service: Endoscopy;  Laterality: N/A;  . EYE SURGERY     2003 -RIGHT CATARACT EXTRACTED AND LEFT WAS DONE IN 2004  . FLEXIBLE SIGMOIDOSCOPY N/A 06/26/2014   Procedure: FLEXIBLE SIGMOIDOSCOPY;  Surgeon: Winfield Cunas., MD;  Location: [facility] ENDOSCOPY;  Service: Endoscopy;  Laterality: N/A;  . FRONTALIS SUSPENSION  10-09-2010   lifting eyelids AND SECOND EYE SURGERY November 19, 2010  . meningioma resected  20O2  . PARTIAL COLECTOMY  2008  . subglottal mucocoel  2000  . THYROIDECTOMY  1986  . TONSILLECTOMY  1938  . TOTAL HIP ARTHROPLASTY  OCT 2006   right  . TOTAL HIP ARTHROPLASTY  07/27/2011   Procedure: TOTAL HIP ARTHROPLASTY;  Surgeon: Gearlean Alf, MD;  Location: [facility] ORS;  Service: Orthopedics;  Laterality: Left;  Marland Kitchen VESICOVAGINAL FISTULA CLOSURE W/ TAH    . WRIST TENDON LESION REMOVED 2007       reports that she has never smoked. She has never used smokeless tobacco. She reports that she drinks alcohol. She reports that she does not use drugs.  Allergies  Allergen Reactions  . Sulfamethoxazole Nausea And Vomiting  . Tape Other (See Comments)     TAPE TEARS AND BRUISES THE SKIN!!  . Biaxin [Clarithromycin] Other (See Comments)    Bitter taste  . Ciprofloxacin Rash  . Codeine Nausea And Vomiting  . Dilantin [Phenytoin] Rash  . Doxycycline Nausea Only    Very nauseated  . Lactose Intolerance (Gi) Other (See Comments)    gas  . Restasis [Cyclosporine] Other (See Comments) and Swelling    Eyes burn   . Sulfonamide Derivatives Rash    Family History  Problem Relation Age of Onset  . Heart attack Father        deceased    Prior to Admission medications   Medication Sig Start Date End Date Taking? Authorizing Provider  acetaminophen (TYLENOL) 325 MG tablet Take 325-650 mg by mouth every 6 (six) hours as needed (for headaches or pain).    [provider]  albuterol (PROVENTIL HFA;VENTOLIN HFA) 108 (90 Base) MCG/ACT inhaler Inhale 2 puffs into the lungs every 6 (six) hours as needed for wheezing or shortness of breath. 06/12/16 10/02/20  Baird Lyons D, MD  amLODipine (NORVASC) 2.5 MG tablet Take 2.5 mg by mouth daily.    [provider]  Artificial Tear Solution (BION TEARS) 0.1-0.3 % SOLN Place 1 drop  into both eyes every morning.     [provider]  Artificial Tear Solution (GENTEAL TEARS) 0.1-0.2-0.3 % SOLN Place 1 drop into both eyes at bedtime.    [provider]  B Complex Vitamins (VITAMIN B COMPLEX PO) Take 1 tablet by mouth daily.    [provider]  BIOTIN PO Take 1 tablet by mouth daily.    [provider]  Calcium Carbonate-Vitamin D3 (CALCIUM 600-D) 600-400 MG-UNIT TABS Take 1 tablet by mouth daily.    [provider]  cholecalciferol (VITAMIN D) 1000 UNITS tablet Take 1,000 Units by mouth daily.    [provider]  Cyanocobalamin (VITAMIN B-12) 2500 MCG SUBL Take 2,500 mcg by mouth daily.     [provider]  donepezil (ARICEPT) 5 MG tablet Take 1 tablet (5 mg total) by mouth at bedtime. 03/03/17   Emeterio Reeve, DO  escitalopram  (LEXAPRO) 5 MG tablet Take 5 mg by mouth daily. 02/01/17   [provider]  HYDROcodone-acetaminophen (NORCO/VICODIN) 5-325 MG tablet Take 1 tablet by mouth every 4 (four) hours as needed for moderate pain or severe pain. 02/20/17   Forde Dandy, MD  lactase (LACTAID) 3000 UNITS tablet Take 3,000 Units by mouth daily as needed (lactose intolerant).    [provider]  levothyroxine (SYNTHROID, LEVOTHROID) 75 MCG tablet Take 1 tablet (75 mcg total) by mouth daily before breakfast. 02/02/17   Emeterio Reeve, DO  mesalamine (LIALDA) 1.2 g EC tablet Take 2.4 g by mouth daily. 02/08/17   [provider]  mirabegron ER (MYRBETRIQ) 25 MG TB24 tablet Take 1 tablet (25 mg total) by mouth daily. 03/05/17   Emeterio Reeve, DO  Multiple Vitamin (MULTIVITAMIN) tablet Take 1 tablet by mouth daily.    [provider]  Multiple Vitamins-Minerals (CENTRUM SILVER 50+WOMEN PO) Take by mouth.    [provider]  Omega-3 Fatty Acids (FISH OIL) 1000 MG CAPS Take by mouth.    [provider]  predniSONE (DELTASONE) 10 MG tablet Take 10 mg by mouth daily with breakfast.    [provider]  Probiotic Product (PROBIOTIC DAILY PO) Take 1 capsule by mouth daily.     [provider]  ranitidine (ZANTAC) 150 MG tablet Take 150 mg by mouth 2 (two) times daily.     [provider]  traMADol (ULTRAM) 50 MG tablet Take 50 mg by mouth daily as needed for pain. 02/01/17   [provider]    Physical Exam: Vitals:   03/06/17 1415 03/06/17 1444 03/06/17 1722  BP:  (!) 153/72 (!) 151/80  Pulse:  99 92  Resp:  16 18  Temp:  98.5 F (36.9 C)   TempSrc:  Oral   SpO2:  94% 95%  Weight: 61.2 kg (135 lb)    Height: 5\' 3"  (1.6 m)      GENERAL: 83 y.o.-year-old female patient, well-developed, well-nourished lying in the bed in no acute distress.  Pleasant and cooperative.   HEENT: Head with small left posterior occipital scalp lac with 3  staples.  Linear bruising along left lateral nose and inferior orbit.  Pupils equal. Mucus membranes moist. NECK: Supple, full range of motion. No JVD, no bruit heard. No thyroid enlargement, no tenderness, no cervical lymphadenopathy. CHEST: Normal breath sounds bilaterally. No wheezing, rales, rhonchi or crackles. No use of accessory muscles of respiration.  No reproducible chest wall tenderness.  CARDIOVASCULAR: S1, S2 normal. No murmurs, rubs, or gallops. Cap refill <2 seconds. Pulses intact  distally.  ABDOMEN: Soft, nondistended, nontender. No rebound, guarding, rigidity. Normoactive bowel sounds present in all four quadrants.  EXTREMITIES: Left shoulder immobilized.  Tender to palpation at [facility] joint, no edema, erthyma, bruising.  Limited ROM 2/2 pain.  No pedal edema, cyanosis, or clubbing. No calf tenderness or Homan's sign.  NEUROLOGIC: The patient is alert and oriented x 3. Cranial nerves II through XII are grossly intact with no focal sensorimotor deficit. PSYCHIATRIC:  Normal affect, mood, thought content.    Labs on Admission:  CBC:  Recent Labs Lab 03/06/17 1733 03/06/17 1816  WBC 13.8*  --   NEUTROABS 12.0*  --   HGB 12.0 12.2  HCT 37.3 36.0  MCV 87.6  --   PLT 336  --    Basic Metabolic Panel:  Recent Labs Lab 03/06/17 1816  NA 137  K 4.0  CL 100*  GLUCOSE 164*  BUN 18  CREATININE 1.10*   GFR: Estimated Creatinine Clearance: 30.4 mL/min (A) (by C-G formula based on SCr of 1.1 mg/dL (H)). Liver Function Tests: No results for input(s): AST, ALT, ALKPHOS, BILITOT, PROT, ALBUMIN in the last 168 hours. No results for input(s): LIPASE, AMYLASE in the last 168 hours. No results for input(s): AMMONIA in the last 168 hours. Coagulation Profile: No results for input(s): INR, PROTIME in the last 168 hours. Cardiac Enzymes: No results for input(s): CKTOTAL, CKMB, CKMBINDEX, TROPONINI in the last 168 hours. BNP (last 3 results) No results for input(s): PROBNP in the  last 8760 hours. HbA1C: No results for input(s): HGBA1C in the last 72 hours. CBG: No results for input(s): GLUCAP in the last 168 hours. Lipid Profile: No results for input(s): CHOL, HDL, LDLCALC, TRIG, CHOLHDL, LDLDIRECT in the last 72 hours. Thyroid Function Tests: No results for input(s): TSH, T4TOTAL, FREET4, T3FREE, THYROIDAB in the last 72 hours. Anemia Panel: No results for input(s): VITAMINB12, FOLATE, FERRITIN, TIBC, IRON, RETICCTPCT in the last 72 hours. Urine analysis:    Component Value Date/Time   COLORURINE YELLOW 02/02/2017 [place] 02/02/2017 1206   LABSPEC 1.015 02/02/2017 1206   PHURINE 6.5 02/02/2017 1206   GLUCOSEU NEGATIVE 02/02/2017 1206   HGBUR NEGATIVE 02/02/2017 1206   BILIRUBINUR NEGATIVE 07/22/2016 1510   KETONESUR NEGATIVE 02/02/2017 1206   PROTEINUR NEGATIVE 02/02/2017 1206   UROBILINOGEN 0.2 12/20/2014 2042   NITRITE NEGATIVE 02/02/2017 1206   LEUKOCYTESUR NEGATIVE 02/02/2017 1206   Sepsis Labs: @LABRCNTIP (procalcitonin:4,lacticidven:4) )No results found for this or any previous visit (from the past 240 hour(s)).   Radiological Exams on Admission: Ct Head Wo Contrast  Result Date: 03/06/2017 CLINICAL DATA:  Headache, posttraumatic. Initial encounter. EXAM: CT HEAD WITHOUT CONTRAST TECHNIQUE: Contiguous axial images were obtained from the base of the skull through the vertex without intravenous contrast. COMPARISON:  07/26/2016 FINDINGS: Brain: Moderate atrophy. 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 Stable encephalomalacia deep to the craniotomy site that has been noted since at least 2011. No acute infarct, hemorrhage, or hydrocephalus. Vascular: Atherosclerotic calcification.  No hyperdense vessel. Skull: No acute or aggressive finding. Unremarkable left parietal craniotomy site. Sinuses/Orbits: No acute finding.  Bilateral cataract resection. IMPRESSION: 1. No evidence of intracranial injury.  2. Chronic findings are stable from prior and described above. Electronically Signed   By: Monte Fantasia M.D.   On: 03/06/2017 16:27   Dg Shoulder Left Portable  Result Date: 03/06/2017 CLINICAL DATA:  Pain after a fall EXAM: LEFT SHOULDER - 1 VIEW COMPARISON:  02/20/2017 FINDINGS:  Re- demonstrated displaced fracture at the left humeral neck, similar alignment 1 allowing for differences in positioning. Bridging callus evident consistent with subacute process. No humeral head dislocation. IMPRESSION: Healing left humeral neck fracture without significant change in alignment allowing for differences in positioning Electronically Signed   By: Donavan Foil M.D.   On: 03/06/2017 17:03    Assessment/Plan  This is a 83 y.o. female with a history of bronchitis, HTN, HLD, IBS, UC now being admitted with:  #. Post-concussive symptoms s/p head trauma - Admit observation - PT eval in am - Gentle fluids - Meclizine and antiemetics PRN - Fall precautions  #. History of HTN - Continue Norvasc  #. History of depression - Continue Lexapro  #. History of IBS - Continue Lialda, prednisone  #. History of hypothyroidism - Continue Synthroid  #. History of GERD - Continue Pepcid  Admission status: Observation IV Fluids: NS Diet/Nutrition: Heart healthy Consults called: PT  DVT Px: SCDs and early ambulation.  Add Lovenox if immobilized.  Code Status: Full Code  Disposition Plan: To home in 1-2 days  All the records are reviewed and case discussed with ED provider. Management plans discussed with the patient and/or family who express understanding and agree with plan of care.  Harvie Bridge D.O. on 03/06/2017 at 6:54 PM CC: Primary care physician; Emeterio Reeve, DO   03/06/2017, 6:54 PM

## 2017-03-06 NOTE — ED Triage Notes (Signed)
Pt fell backward off stool hitting back of head, lac noted and re enforced to control bleeding. No LOC.

## 2017-03-07 DIAGNOSIS — S060X0A Concussion without loss of consciousness, initial encounter: Secondary | ICD-10-CM | POA: Diagnosis not present

## 2017-03-07 DIAGNOSIS — W19XXXA Unspecified fall, initial encounter: Secondary | ICD-10-CM | POA: Diagnosis not present

## 2017-03-07 DIAGNOSIS — F0781 Postconcussional syndrome: Secondary | ICD-10-CM | POA: Diagnosis not present

## 2017-03-07 LAB — BASIC METABOLIC PANEL
Anion gap: 10 (ref 5–15)
BUN: 17 mg/dL (ref 6–20)
CHLORIDE: 103 mmol/L (ref 101–111)
CO2: 27 mmol/L (ref 22–32)
Calcium: 9.4 mg/dL (ref 8.9–10.3)
Creatinine, Ser: 0.85 mg/dL (ref 0.44–1.00)
GFR calc non Af Amer: >60 mL/min (ref >60)
Glucose, Bld: 105 mg/dL — ABNORMAL HIGH (ref 65–99)
POTASSIUM: 3.5 mmol/L (ref 3.5–5.1)
SODIUM: 140 mmol/L (ref 135–145)

## 2017-03-07 LAB — CBC
HEMATOCRIT: 36.7 % (ref 36.0–46.0)
HEMOGLOBIN: 11.7 g/dL — AB (ref 12.0–15.0)
MCH: 28 pg (ref 26.0–34.0)
MCHC: 31.9 g/dL (ref 30.0–36.0)
MCV: 87.8 fL (ref 78.0–100.0)
Platelets: 337 10*3/uL (ref 150–400)
RBC: 4.18 MIL/uL (ref 3.87–5.11)
RDW: 14.8 % (ref 11.5–15.5)
WBC: 11.5 10*3/uL — AB (ref 4.0–10.5)

## 2017-03-07 MED ORDER — MECLIZINE HCL 25 MG PO TABS: 25.0000 mg | ORAL_TABLET | Freq: Three times a day (TID) | ORAL | 0 refills | Status: DC | PRN

## 2017-03-07 NOTE — Care Management Note (Signed)
Case Management Note  Patient Details  Name: DAESIA ZYLKA MRN: [id number] Date of Birth: 1929/03/22  Subjective/Objective:   Post concussive syndrome                 Action/Plan: Discharge Planning: NCM spoke to pt and husband at bedside. Pt has RW, cane and bedside commode at home. States he prefers HHPT. States her independent senior living have outpt PT available. Offered choice for HH/list provided. Pt agreeable to [facility] for [facility] PT, and OT. Contacted [facility] with new referral.   PCP Emeterio Reeve MD  Expected Discharge Date:  03/07/17               Expected Discharge Plan:  [place]  In-House Referral:  NA  Discharge planning Services  CM Consult  Post Acute Care Choice:  Home Health Choice offered to:  Patient, Spouse  DME Arranged:  N/A DME Agency:  NA  HH Arranged:  PT, OT HH Agency:  [place]  Status of Service:  Completed, signed off  If discussed at [place] of Stay Meetings, dates discussed:    Additional Comments:  Erenest Rasher, RN 03/07/2017, 3:24 PM

## 2017-03-07 NOTE — Progress Notes (Signed)
Pt leaving this afternoon with her spouse. Alert, oriented and without c/o. Discharge instructions given/explained with pt verbalizing understanding.  HHPT/OT has been setup with CM. Pt aware to followup. Pt left with cane.

## 2017-03-07 NOTE — Progress Notes (Signed)
Blood pressure reading 161/75 with pulse at 79. No PRN medications are ordered at this time. Night shift hospitalist notified.

## 2017-03-07 NOTE — Discharge Summary (Signed)
Physician Discharge Summary  Sierra [name] MPN:[id number] DOB: 1929/04/07 DOA: 03/06/2017  PCP: Emeterio Reeve, DO  Admit date: 03/06/2017 Discharge date: 03/07/2017  Admitted From: home Disposition:  Home  Recommendations for Outpatient Follow-up:  1. Follow up with PCP in 1-2 weeks 2. Home health and physical therapy.  Home Health:no Equipment/Devices:none  Discharge Condition:Stable CODE STATUS:Full Diet recommendation: Heart Healthy   Brief/Interim Summary: 83 y.o. female with a known history of bronchitis, HTN, HLD, IBS, UC presents to the emergency department for evaluation of head trauma s/p fall.  Patient was in a usual state of health until this afternoon when she was seated on a bench which broke underneath her.  She fell backwards hitting the back of her head on the floor.  There was no prodrome, preceding symptoms, loss of consciousness before or after the fall.   Discharge Diagnoses:  Active Problems:   Post concussive syndrome essential hypertension: History of depression Hypothyroidism CT scan of the head was done that showed no acute findings, lefthumerus and shoulder x-ray show healing old fractures. 12-lead EKG shows sinus rhythm, showing mild leukocytosis which is improving she has remained afebrile she relates no cough, shortness of breath, fever overnight no decerebrating urinates. She relates she has no symptoms overnight she slept well's tolerating her diet. Physical therapy evaluated the patient they recommended home health PT.   Discharge Instructions  Discharge Instructions    Diet - low sodium heart healthy    Complete by:  As directed    Increase activity slowly    Complete by:  As directed      Allergies as of 03/07/2017      Reactions   Sulfamethoxazole Nausea And Vomiting   Tape Other (See Comments)   TAPE TEARS AND BRUISES THE SKIN!!   Biaxin [clarithromycin] Other (See Comments)   Bitter taste   Ciprofloxacin Rash   Codeine  Nausea And Vomiting   Dilantin [phenytoin] Rash   Doxycycline Nausea Only   Very nauseated   Lactose Intolerance (gi) Other (See Comments)   gas   Restasis [cyclosporine] Other (See Comments), Swelling   Eyes burn   Sulfonamide Derivatives Rash      Medication List    TAKE these medications   acetaminophen 500 MG tablet Commonly known as:  TYLENOL Take 500 mg by mouth every 6 (six) hours as needed for mild pain or headache (for headaches or pain).   albuterol 108 (90 Base) MCG/ACT inhaler Commonly known as:  PROVENTIL HFA;VENTOLIN HFA Inhale 2 puffs into the lungs every 6 (six) hours as needed for wheezing or shortness of breath.   amLODipine 2.5 MG tablet Commonly known as:  NORVASC Take 2.5 mg by mouth daily.   GENTEAL TEARS 0.1-0.2-0.3 % Soln Place 1 drop into both eyes at bedtime.   BION TEARS 0.1-0.3 % Soln Place 1 drop into both eyes every morning.   BIOTIN PO Take 1 tablet by mouth daily.   CALCIUM 600-D 600-400 MG-UNIT Tabs Generic drug:  Calcium Carbonate-Vitamin D3 Take 1 tablet by mouth daily.   CENTRUM SILVER 50+WOMEN PO Take by mouth.   cholecalciferol 1000 units tablet Commonly known as:  VITAMIN D Take 1,000 Units by mouth daily.   donepezil 5 MG tablet Commonly known as:  ARICEPT Take 1 tablet (5 mg total) by mouth at bedtime.   escitalopram 5 MG tablet Commonly known as:  LEXAPRO Take 5 mg by mouth daily.   Fish Oil 1000 MG Caps Take by mouth.   HYDROcodone-acetaminophen  5-325 MG tablet Commonly known as:  NORCO/VICODIN Take 1 tablet by mouth every 4 (four) hours as needed for moderate pain or severe pain.   lactase 3000 units tablet Commonly known as:  LACTAID Take 3,000 Units by mouth daily as needed (lactose intolerant).   levothyroxine 75 MCG tablet Commonly known as:  SYNTHROID, LEVOTHROID Take 1 tablet (75 mcg total) by mouth daily before breakfast.   meclizine 25 MG tablet Commonly known as:  ANTIVERT Take 1 tablet (25 mg  total) by mouth 3 (three) times daily as needed for dizziness.   mesalamine 1.2 g EC tablet Commonly known as:  LIALDA Take 2.4 g by mouth daily.   mirabegron ER 25 MG Tb24 tablet Commonly known as:  MYRBETRIQ Take 1 tablet (25 mg total) by mouth daily.   predniSONE 10 MG tablet Commonly known as:  DELTASONE Take 10 mg by mouth daily with breakfast.   PROBIOTIC DAILY PO Take 1 capsule by mouth daily.   ranitidine 150 MG tablet Commonly known as:  ZANTAC Take 150 mg by mouth 2 (two) times daily.   VITAMIN B COMPLEX PO Take 1 tablet by mouth daily.   Vitamin B-12 2500 MCG Subl Take 2,500 mcg by mouth daily.       Allergies  Allergen Reactions  . Sulfamethoxazole Nausea And Vomiting  . Tape Other (See Comments)    TAPE TEARS AND BRUISES THE SKIN!!  . Biaxin [Clarithromycin] Other (See Comments)    Bitter taste  . Ciprofloxacin Rash  . Codeine Nausea And Vomiting  . Dilantin [Phenytoin] Rash  . Doxycycline Nausea Only    Very nauseated  . Lactose Intolerance (Gi) Other (See Comments)    gas  . Restasis [Cyclosporine] Other (See Comments) and Swelling    Eyes burn   . Sulfonamide Derivatives Rash    Consultations:  none   Procedures/Studies: Ct Head Wo Contrast  Result Date: 03/06/2017 CLINICAL DATA:  Headache, posttraumatic. Initial encounter. EXAM: CT HEAD WITHOUT CONTRAST TECHNIQUE: Contiguous axial images were obtained from the base of the skull through the vertex without intravenous contrast. COMPARISON:  07/26/2016 FINDINGS: Brain: Moderate atrophy. 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 Stable encephalomalacia deep to the craniotomy site that has been noted since at least 2011. No acute infarct, hemorrhage, or hydrocephalus. Vascular: Atherosclerotic calcification.  No hyperdense vessel. Skull: No acute or aggressive finding. Unremarkable left parietal craniotomy site. Sinuses/Orbits: No acute finding.  Bilateral  cataract resection. IMPRESSION: 1. No evidence of intracranial injury. 2. Chronic findings are stable from prior and described above. Electronically Signed   By: Monte Fantasia M.D.   On: 03/06/2017 16:27   Dg Shoulder Left Portable  Result Date: 03/06/2017 CLINICAL DATA:  Pain after a fall EXAM: LEFT SHOULDER - 1 VIEW COMPARISON:  02/20/2017 FINDINGS: Re- demonstrated displaced fracture at the left humeral neck, similar alignment 1 allowing for differences in positioning. Bridging callus evident consistent with subacute process. No humeral head dislocation. IMPRESSION: Healing left humeral neck fracture without significant change in alignment allowing for differences in positioning Electronically Signed   By: Donavan Foil M.D.   On: 03/06/2017 17:03   Dg Humerus Left  Result Date: 02/20/2017 CLINICAL DATA:  The patient suffered a left humerus fracture due to a fall 3 weeks ago. Worsened pain today. EXAM: LEFT HUMERUS - 2+ VIEW COMPARISON:  Plain films left shoulder 01/26/2017. FINDINGS: Again seen is mildly comminuted fracture of the surgical neck of the left humerus. Position and  alignment are near anatomic and appear improved compared to the prior exams. There is callus formation about the fracture. No new abnormality is identified. IMPRESSION: Healing surgical neck fracture left humerus demonstrates improved position and alignment compared to the prior exam. No new abnormality Electronically Signed   By: Inge Rise M.D.   On: 02/20/2017 13:28      Subjective: No complaints she feels great  Discharge Exam: Vitals:   03/07/17 0546 03/07/17 0554  BP: (!) 163/81 (!) 161/75  Pulse: 82 79  Resp: 20   Temp: 98.1 F (36.7 C)   SpO2: 95%    Vitals:   03/06/17 1722 03/06/17 2023 03/07/17 0546 03/07/17 0554  BP: (!) 151/80 (!) 141/74 (!) 163/81 (!) 161/75  Pulse: 92 89 82 79  Resp: 18 16 20    Temp:  98.1 F (36.7 C) 98.1 F (36.7 C)   TempSrc:  Oral Oral   SpO2: 95% 94% 95%    Weight:      Height:        General: Pt is alert, awake, not in acute distress Cardiovascular: RRR, S1/S2 +, no rubs, no gallops Respiratory: CTA bilaterally, no wheezing, no rhonchi Abdominal: Soft, NT, ND, bowel sounds + Extremities: no edema, no cyanosis    The results of significant diagnostics from this hospitalization (including imaging, microbiology, ancillary and laboratory) are listed below for reference.     Microbiology: No results found for this or any previous visit (from the past 240 hour(s)).   Labs: BNP (last 3 results) No results for input(s): BNP in the last 8760 hours. Basic Metabolic Panel:  Recent Labs Lab 03/06/17 1816 03/07/17 0543  NA 137 140  K 4.0 3.5  CL 100* 103  CO2  --  27  GLUCOSE 164* 105*  BUN 18 17  CREATININE 1.10* 0.85  CALCIUM  --  9.4   Liver Function Tests: No results for input(s): AST, ALT, ALKPHOS, BILITOT, PROT, ALBUMIN in the last 168 hours. No results for input(s): LIPASE, AMYLASE in the last 168 hours. No results for input(s): AMMONIA in the last 168 hours. CBC:  Recent Labs Lab 03/06/17 1733 03/06/17 1816 03/07/17 0543  WBC 13.8*  --  11.5*  NEUTROABS 12.0*  --   --   HGB 12.0 12.2 11.7*  HCT 37.3 36.0 36.7  MCV 87.6  --  87.8  PLT 336  --  337   Cardiac Enzymes: No results for input(s): CKTOTAL, CKMB, CKMBINDEX, TROPONINI in the last 168 hours. BNP: Invalid input(s): POCBNP CBG: No results for input(s): GLUCAP in the last 168 hours. D-Dimer No results for input(s): DDIMER in the last 72 hours. Hgb A1c No results for input(s): HGBA1C in the last 72 hours. Lipid Profile No results for input(s): CHOL, HDL, LDLCALC, TRIG, CHOLHDL, LDLDIRECT in the last 72 hours. Thyroid function studies No results for input(s): TSH, T4TOTAL, T3FREE, THYROIDAB in the last 72 hours.  Invalid input(s): FREET3 Anemia work up No results for input(s): VITAMINB12, FOLATE, FERRITIN, TIBC, IRON, RETICCTPCT in the last 72  hours. Urinalysis    Component Value Date/Time   COLORURINE YELLOW 02/02/2017 [place] 02/02/2017 1206   LABSPEC 1.015 02/02/2017 1206   PHURINE 6.5 02/02/2017 1206   GLUCOSEU NEGATIVE 02/02/2017 1206   HGBUR NEGATIVE 02/02/2017 1206   BILIRUBINUR NEGATIVE 07/22/2016 [place] 02/02/2017 1206   PROTEINUR NEGATIVE 02/02/2017 1206   UROBILINOGEN 0.2 12/20/2014 2042   NITRITE NEGATIVE 02/02/2017 1206   LEUKOCYTESUR NEGATIVE  02/02/2017 1206   Sepsis Labs Invalid input(s): PROCALCITONIN,  WBC,  LACTICIDVEN Microbiology No results found for this or any previous visit (from the past 240 hour(s)).   Time coordinating discharge: Over 30 minutes  SIGNED:   Charlynne Cousins, MD  [facility] 03/07/2017, 9:02 AM Pager   If 7PM-7AM, please contact night-coverage [email or web address] Password [email or web address]

## 2017-03-07 NOTE — Evaluation (Signed)
Physical Therapy Evaluation Patient Details Name: Sierra [name] MRN: [id number] DOB: February 09, 1929 Today's Date: 03/07/2017   History of Present Illness  83 y.o. female with a known history of bronchitis, HTN, HLD, IBS, brain tumor resection, L shoulder fx 6 weeks ago presents to the emergency department for evaluation of head trauma s/p fall.   Clinical Impression  Pt ambulated 170' with straight cane, no loss of balance. HHPT recommended due to h/o 3 falls this past year. Pt's husband provides supervision when pt ambulates at home. No DME needs. Ready to DC home from PT standpoint.      Follow Up Recommendations Home health PT    Equipment Recommendations  None recommended by PT    Recommendations for Other Services       Precautions / Restrictions Precautions Precautions: Fall Precaution Comments: 3 falls this past year (most recent one just PTA resulted from a chair breaking) Restrictions Weight Bearing Restrictions: No      Mobility  Bed Mobility Overal bed mobility: Modified Independent             General bed mobility comments: with rail  Transfers Overall transfer level: Needs assistance Equipment used: Straight cane Transfers: Sit to/from Stand Sit to Stand: Supervision         General transfer comment: supervision for safety 2* h/o 3 falls this year  Ambulation/Gait Ambulation/Gait assistance: Supervision Ambulation Distance (Feet): 170 Feet Assistive device: Straight cane Gait Pattern/deviations: Decreased stride length   Gait velocity interpretation: at or above normal speed for age/gender General Gait Details: no LOB, decr step length B  Stairs            Wheelchair Mobility    Modified Rankin (Stroke Patients Only)       Balance Overall balance assessment: History of Falls;Needs assistance   Sitting balance-Leahy Scale: Good       Standing balance-Leahy Scale: Fair                               Pertinent  Vitals/Pain Pain Assessment: 0-10 Pain Score: 2  Pain Location: back of head Pain Descriptors / Indicators: Aching Pain Intervention(s): Limited activity within patient's tolerance;Monitored during session    Home Living                        Prior Function                 Hand Dominance        Extremity/Trunk Assessment   Upper Extremity Assessment Upper Extremity Assessment: LUE deficits/detail LUE Deficits / Details: LUE in sling 2* shoulder fx 6 weeks ago    Lower Extremity Assessment Lower Extremity Assessment: Overall WFL for tasks assessed    Cervical / Trunk Assessment Cervical / Trunk Assessment: Normal  Communication      Cognition Arousal/Alertness: Awake/alert Behavior During Therapy: WFL for tasks assessed/performed Overall Cognitive Status: Within Functional Limits for tasks assessed                                        General Comments      Exercises     Assessment/Plan    PT Assessment All further PT needs can be met in the next venue of care  PT Problem List Decreased balance       PT  Treatment Interventions      PT Goals (Current goals can be found in the Care Plan section)  Acute Rehab PT Goals PT Goal Formulation: All assessment and education complete, DC therapy    Frequency     Barriers to discharge        Co-evaluation               AM-PAC PT "6 Clicks" Daily Activity  Outcome Measure Difficulty turning over in bed (including adjusting bedclothes, sheets and blankets)?: A Little Difficulty moving from lying on back to sitting on the side of the bed? : A Little Difficulty sitting down on and standing up from a chair with arms (e.g., wheelchair, bedside commode, etc,.)?: A Little Help needed moving to and from a bed to chair (including a wheelchair)?: A Little Help needed walking in hospital room?: A Little Help needed climbing 3-5 steps with a railing? : A Little 6 Click Score: 18     End of Session Equipment Utilized During Treatment: Gait belt Activity Tolerance: Patient tolerated treatment well Patient left: in chair;with call bell/phone within reach;with chair alarm set Nurse Communication: Mobility status      Time: 1014-1030 PT Time Calculation (min) (ACUTE ONLY): 16 min   Charges:   PT Evaluation $PT Eval Low Complexity: 1 Low     PT G Codes:   PT G-Codes **NOT FOR INPATIENT CLASS** Functional Assessment Tool Used: AM-PAC 6 Clicks Basic Mobility Functional Limitation: Mobility: Walking and moving around Mobility: Walking and Moving Around Current Status (N1907): At least 40 percent but less than 60 percent impaired, limited or restricted Mobility: Walking and Moving Around Goal Status [phone]): At least 40 percent but less than 60 percent impaired, limited or restricted      Philomena Doheny 03/07/2017, 10:44 AM [phone]

## 2017-03-10 ENCOUNTER — Encounter: Payer: Self-pay | Admitting: Osteopathic Medicine

## 2017-03-10 ENCOUNTER — Ambulatory Visit (INDEPENDENT_AMBULATORY_CARE_PROVIDER_SITE_OTHER): Payer: Medicare Other

## 2017-03-10 ENCOUNTER — Ambulatory Visit (INDEPENDENT_AMBULATORY_CARE_PROVIDER_SITE_OTHER): Payer: Medicare Other | Admitting: Osteopathic Medicine

## 2017-03-10 VITALS — BP 156/63 | HR 97 | Temp 97.5°F

## 2017-03-10 DIAGNOSIS — F0781 Postconcussional syndrome: Secondary | ICD-10-CM | POA: Diagnosis not present

## 2017-03-10 DIAGNOSIS — R42 Dizziness and giddiness: Secondary | ICD-10-CM

## 2017-03-10 DIAGNOSIS — G44309 Post-traumatic headache, unspecified, not intractable: Secondary | ICD-10-CM

## 2017-03-10 DIAGNOSIS — R112 Nausea with vomiting, unspecified: Secondary | ICD-10-CM

## 2017-03-10 MED ORDER — PROMETHAZINE HCL 25 MG/ML IJ SOLN
12.5000 mg | Freq: Once | INTRAMUSCULAR | Status: AC
  Administered 2017-03-10: 12.5 mg via INTRAMUSCULAR

## 2017-03-10 MED ORDER — ONDANSETRON 8 MG PO TBDP: 8.0000 mg | ORAL_TABLET | Freq: Three times a day (TID) | ORAL | 1 refills | Status: DC | PRN

## 2017-03-10 NOTE — Patient Instructions (Signed)
Concussion, Adult  A concussion is a brain injury from a direct hit (blow) to the head or body. This injury causes the brain to shake quickly back and forth inside the skull. It is caused by:   A hit to the head.   A quick and sudden movement (jolt) of the head or neck.    How fast you will get better from a concussion depends on many things like how bad your concussion was, what part of your brain was hurt, how old you are, and how healthy you were before the concussion. Recovery can take time. It is important to wait to return to activity until a doctor says it is safe and your symptoms are all gone.  Follow these instructions at home:  Activity   Limit activities that need a lot of thought or concentration. These include:  ? Homework or work for your job.  ? Watching TV.  ? Computer work.  ? Playing memory games and puzzles.   Rest. Rest helps the brain to heal. Make sure you:  ? Get plenty of sleep at night. Do not stay up late.  ? Go to bed at the same time every day.  ? Rest during the day. Take naps or rest breaks when you feel tired.   It can be dangerous if you get another concussion before the first one has healed Do not do activities that could cause a second concussion, such as riding a bike or playing sports.   Ask your doctor when you can return to your normal activities, like driving, riding a bike, or using machinery. Your ability to react may be slower. Do not do these activities if you are dizzy. Your doctor will likely give you a plan for slowly going back to activities.  General instructions   Take over-the-counter and prescription medicines only as told by your doctor.   Do not drink alcohol until your doctor says you can.   If it is harder than usual to remember things, write them down.   If you are easily distracted, try to do one thing at a time. For example, do not try to watch TV while making dinner.   Talk with family members or close friends when you need to make important  decisions.   Watch your symptoms and tell other people to do the same. Other problems (complications) can happen after a concussion. Older adults with a brain injury may have a higher risk of serious problems, such as a blood clot in the brain.   Tell your teachers, school nurse, school counselor, coach, athletic trainer, or work manager about your injury and symptoms. Tell them about what you can or cannot do. They should watch for:  ? More problems with attention or concentration.  ? More trouble remembering or learning new information.  ? More time needed to do tasks or assignments.  ? Being more annoyed (irritable) or having a harder time dealing with stress.  ? Any other symptoms that get worse.   Keep all follow-up visits as told by your health care provider. This is important.  Prevention   It is very important that you donot get another brain injury, especially before you have healed. In rare cases, another injury can cause permanent brain damage, brain swelling, or death. You have the most risk if you get another head injury in the first 7-10 days after you were hurt before. To avoid injuries:  ? Wear a seat belt when you ride in   a car.  ? Do not drink too much alcohol.  ? Avoid activities that could make you get a second concussion, like contact sports.  ? Wear a helmet when you do activities like:   Biking.   Skiing.   Skateboarding.   Skating.  ? Make your home safe by:   Removing things from the floor or stairs that could make you trip.   Using grab bars in bathrooms and handrails by stairs.   Placing non-slip mats on floors and in bathtubs.   Putting more light in dark areas.  Contact a doctor if:   Your symptoms get worse.   You have new symptoms.   You keep having symptoms for more than 2 weeks.  Get help right away if:   You have bad headaches, or your headaches get worse.   You have weakness in any part of your body.   You have loss of feeling (numbness).   You feel off  balance.   You keep throwing up (vomiting).   You feel more sleepy.   The black center of one eye (pupil) is bigger than the other one.   You twitch or shake violently (convulse) or have a seizure.   Your speech is not clear (is slurred).   You feel more tired, more confused, or more annoyed.   You do not recognize people or places.   You have neck pain.   It is hard to wake you up.   You have strange behavior changes.   You pass out (lose consciousness).  Summary   A concussion is a brain injury from a direct hit (blow) to the head or body.   This condition is treated with rest and careful watching of symptoms.   If you keep having symptoms for more than 2 weeks, call your doctor.  This information is not intended to replace advice given to you by your health care provider. Make sure you discuss any questions you have with your health care provider.  Document Released: 04/15/2009 Document Revised: 04/11/2016 Document Reviewed: 04/11/2016  [employer] Interactive Patient Education  2017 [employer]

## 2017-03-10 NOTE — Progress Notes (Signed)
HPI: Sierra [name] is a 83 y.o. female who has a past medical history of  Arthritis; Asthmatic bronchitis; Balance problem; Blood transfusion; Cardiac murmur; Chronic kidney disease; Dizziness; DOE (dyspnea on exertion); Esophageal reflux; GI bleed (01/06/2013); Hyperlipemia; Hypertension; Hypothyroidism; IBS (irritable bowel syndrome); and Ulcerative colitis.  who presents to [facility] today, 03/10/17,  for chief complaint of:  Chief Complaint  Patient presents with  . hospital f/u    had trauma, now nauseous      . Recent fall and head trama, obs in hospital. Was initially doing well but yesterday developed persistent nausea and dry heaves, no vomiting but unable to bring anything up or to eat anything.  [name] at back of head, (+)ecchymoses around L eye, not sure why because she states only hit the back of her head.   Per daughter's e-mail this morning: "Was nauseated all day yesterday and vomited last night. Still nauseated this am. No change in mental status as far as I know."  Records reviewed: Admitted 03/06/17 - 03/07/17 after fall - fell backwards off stool and hitting the back of her head, according to daughter (see e-mail in [place]).  Active Problems:  Post concussive syndrome, essential hypertension, History of depression, Hypothyroidism CT scan of the head was done that showed no acute findings, lefthumerus and shoulder x-ray show healing old fractures. 12-lead EKG shows sinus rhythm, showing mild leukocytosis which is improving she has remained afebrile she relates no cough, shortness of breath, fever overnight no decerebrating urinates." Eval by PT prior to discharge - home PT recommended.   Ct Head Wo Contrast  Result Date: 03/06/2017 CLINICAL DATA:  Headache, posttraumatic. Initial encounter. EXAM: CT HEAD WITHOUT CONTRAST TECHNIQUE: Contiguous axial images were obtained from the base of the skull through the vertex without  intravenous contrast. COMPARISON:  07/26/2016 FINDINGS: Brain: Moderate atrophy. 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 Stable encephalomalacia deep to the craniotomy site that has been noted since at least 2011. No acute infarct, hemorrhage, or hydrocephalus. Vascular: Atherosclerotic calcification.  No hyperdense vessel. Skull: No acute or aggressive finding. Unremarkable left parietal craniotomy site. Sinuses/Orbits: No acute finding.  Bilateral cataract resection. IMPRESSION: 1. No evidence of intracranial injury. 2. Chronic findings are stable from prior and described above. Electronically Signed   By: Monte Fantasia M.D.   On: 03/06/2017 16:27   Dg Shoulder Left Portable  Result Date: 03/06/2017 CLINICAL DATA:  Pain after a fall EXAM: LEFT SHOULDER - 1 VIEW COMPARISON:  02/20/2017 FINDINGS: Re- demonstrated displaced fracture at the left humeral neck, similar alignment 1 allowing for differences in positioning. Bridging callus evident consistent with subacute process. No humeral head dislocation. IMPRESSION: Healing left humeral neck fracture without significant change in alignment allowing for differences in positioning Electronically Signed   By: Donavan Foil M.D.   On: 03/06/2017 17:03     Patient is accompanied by husband who assists with history-taking.     Past medical, surgical, social and family history reviewed: nothing to update   Current medication list and allergy/intolerance information reviewed:    Current Outpatient Prescriptions  Medication Sig Dispense Refill  . acetaminophen (TYLENOL) 500 MG tablet Take 500 mg by mouth every 6 (six) hours as needed for mild pain or headache (for headaches or pain).     Marland Kitchen albuterol (PROVENTIL HFA;VENTOLIN HFA) 108 (90 Base) MCG/ACT inhaler Inhale 2 puffs into the lungs every 6 (six) hours as needed for wheezing or  shortness of breath. 1 Inhaler 5  . amLODipine (NORVASC) 2.5 MG tablet Take 2.5 mg by  mouth daily.    . Artificial Tear Solution (BION TEARS) 0.1-0.3 % SOLN Place 1 drop into both eyes every morning.     . Artificial Tear Solution (GENTEAL TEARS) 0.1-0.2-0.3 % SOLN Place 1 drop into both eyes at bedtime.    . B Complex Vitamins (VITAMIN B COMPLEX PO) Take 1 tablet by mouth daily.    Marland Kitchen BIOTIN PO Take 1 tablet by mouth daily.    . Calcium Carbonate-Vitamin D3 (CALCIUM 600-D) 600-400 MG-UNIT TABS Take 1 tablet by mouth daily.    . cholecalciferol (VITAMIN D) 1000 UNITS tablet Take 1,000 Units by mouth daily.    . Cyanocobalamin (VITAMIN B-12) 2500 MCG SUBL Take 2,500 mcg by mouth daily.     Marland Kitchen donepezil (ARICEPT) 5 MG tablet Take 1 tablet (5 mg total) by mouth at bedtime. 30 tablet 2  . escitalopram (LEXAPRO) 5 MG tablet Take 5 mg by mouth daily.    Marland Kitchen HYDROcodone-acetaminophen (NORCO/VICODIN) 5-325 MG tablet Take 1 tablet by mouth every 4 (four) hours as needed for moderate pain or severe pain. (Patient not taking: Reported on 03/06/2017) 18 tablet 0  . lactase (LACTAID) 3000 UNITS tablet Take 3,000 Units by mouth daily as needed (lactose intolerant).    Marland Kitchen levothyroxine (SYNTHROID, LEVOTHROID) 75 MCG tablet Take 1 tablet (75 mcg total) by mouth daily before breakfast. 90 tablet 3  . meclizine (ANTIVERT) 25 MG tablet Take 1 tablet (25 mg total) by mouth 3 (three) times daily as needed for dizziness. 10 tablet 0  . mesalamine (LIALDA) 1.2 g EC tablet Take 2.4 g by mouth daily.    . mirabegron ER (MYRBETRIQ) 25 MG TB24 tablet Take 1 tablet (25 mg total) by mouth daily. 90 tablet 1  . Multiple Vitamins-Minerals (CENTRUM SILVER 50+WOMEN PO) Take by mouth.    . Omega-3 Fatty Acids (FISH OIL) 1000 MG CAPS Take by mouth.    . predniSONE (DELTASONE) 10 MG tablet Take 10 mg by mouth daily with breakfast.    . Probiotic Product (PROBIOTIC DAILY PO) Take 1 capsule by mouth daily.     . ranitidine (ZANTAC) 150 MG tablet Take 150 mg by mouth 2 (two) times daily.      No current  facility-administered medications for this visit.     Allergies  Allergen Reactions  . Sulfamethoxazole Nausea And Vomiting  . Tape Other (See Comments)    TAPE TEARS AND BRUISES THE SKIN!!  . Biaxin [Clarithromycin] Other (See Comments)    Bitter taste  . Ciprofloxacin Rash  . Codeine Nausea And Vomiting  . Dilantin [Phenytoin] Rash  . Doxycycline Nausea Only    Very nauseated  . Lactose Intolerance (Gi) Other (See Comments)    gas  . Restasis [Cyclosporine] Other (See Comments) and Swelling    Eyes burn   . Sulfonamide Derivatives Rash      Review of Systems:  Constitutional:  No  fever, no chills,  HEENT: +headache only where hit head, no vision change, no hearing change, No sore throat, No  sinus pressure  Cardiac: No  chest pain, No  pressure  Respiratory:  No  shortness of breath. No  Cough  Gastrointestinal: No  abdominal pain, +nausea, No  vomiting,  No  blood in stool, No  diarrhea  Skin: No  Rash, +bruising front of face, No other wounds/concerning lesions  Neurologic: +generalized weakness, +dizziness, No  slurred speech/focal  weakness/facial droop   Exam:  BP (!) 156/63   Pulse 97   Temp (!) 97.5 F (36.4 C) (Oral)   SpO2 96%   Constitutional: VS see above. General Appearance: alert, thin, hunched over in wheelchair over basin, verbal but moaning  Eyes: Normal lids and conjunctive, non-icteric sclera, +ecchymoses around L eye/face. EOMI w/o nystagmus, PERRLA w/o photosensitivity   Ears, Nose, Mouth, Throat: MMM, Normal external inspection ears/nares/mouth/lips/gums. TM normal bilaterally. Pharynx/tonsils no erythema, no exudate. Nasal mucosa normal.   Neck: No masses, trachea midline. No thyroid enlargement.   Respiratory: Normal respiratory effort. no wheeze, no rhonchi, no rales  Cardiovascular: S1/S2 normal, no murmur, no rub/gallop auscultated. RRR. No lower extremity edema.   Gastrointestinal: Nontender, no masses. No hepatomegaly, no  splenomegaly. No hernia appreciated. Bowel sounds normal. Rectal exam deferred.   Musculoskeletal: Gait   Neurological: Normal balance/coordination. No tremor. No cranial nerve deficit on limited exam. Motor and sensation intact and symmetric.   Skin: warm, dry, intact except staples In occipital area s/p fall   Psychiatric: Normal judgment/insight. Normal mood and affect. Oriented x3.    Ct Head Wo Contrast  Result Date: 03/10/2017 CLINICAL DATA:  Posttraumatic headache. Fall from chair on Saturday. Subsequent encounter. EXAM: CT HEAD WITHOUT CONTRAST TECHNIQUE: Contiguous axial images were obtained from the base of the skull through the vertex without intravenous contrast. COMPARISON:  03/06/2017 FINDINGS: Brain: No evidence of acute infarction, hemorrhage, hydrocephalus, extra-axial collection or mass lesion/mass effect. Left parietal encephalomalacia deep to the craniotomy site. This correlates with history of meningioma resection in 2002. Atrophy with ventriculomegaly. Mild for age chronic microvascular ischemic change in the cerebral white matter. Vascular: Atherosclerotic calcification.  No hyperdense vessel. Skull: Right occipital parietal skin staples. No fracture or opaque foreign body. Left parietal craniotomy flap that is stable in appearance. Sinuses/Orbits: No evidence of injury. Bilateral cataract resection. IMPRESSION: 1. No evidence of intracranial injury. 2. Right posterior scalp staples.  No calvarial fracture. 3. Stable senescent and post treatment findings. Electronically Signed   By: Monte Fantasia M.D.   On: 03/10/2017 12:00      ASSESSMENT/PLAN: doses twice with 12.5 mg phenergan to help with nausea, which wasn't subsiding after first dose. Advised if persistent symptoms, will need to go to hospital/ER for monitoring and symptom control may require IV medications. Feeling better after second injection and pt felt good to go home.   Post-concussion vertigo - Plan:  ondansetron (ZOFRAN-ODT) 8 MG disintegrating tablet, promethazine (PHENERGAN) injection 12.5 mg  Nausea and vomiting, intractability of vomiting not specified, unspecified vomiting type - Plan: promethazine (PHENERGAN) injection 12.5 mg, CT Head Wo Contrast, ondansetron (ZOFRAN-ODT) 8 MG disintegrating tablet, promethazine (PHENERGAN) injection 12.5 mg  Post-traumatic headache, not intractable, unspecified chronicity pattern - Plan: CT Head Wo Contrast    Patient Instructions  Concussion, Adult A concussion is a brain injury from a direct hit (blow) to the head or body. This injury causes the brain to shake quickly back and forth inside the skull. It is caused by:  A hit to the head.  A quick and sudden movement (jolt) of the head or neck.  How fast you will get better from a concussion depends on many things like how bad your concussion was, what part of your brain was hurt, how old you are, and how healthy you were before the concussion. Recovery can take time. It is important to wait to return to activity until a doctor says it is safe and your symptoms are  all gone. Follow these instructions at home: Activity  Limit activities that need a lot of thought or concentration. These include: ? Homework or work for your job. ? Watching TV. ? Computer work. ? Playing memory games and puzzles.  Rest. Rest helps the brain to heal. Make sure you: ? Get plenty of sleep at night. Do not stay up late. ? Go to bed at the same time every day. ? Rest during the day. Take naps or rest breaks when you feel tired.  It can be dangerous if you get another concussion before the first one has healed Do not do activities that could cause a second concussion, such as riding a bike or playing sports.  Ask your doctor when you can return to your normal activities, like driving, riding a bike, or using machinery. Your ability to react may be slower. Do not do these activities if you are dizzy. Your doctor  will likely give you a plan for slowly going back to activities. General instructions  Take over-the-counter and prescription medicines only as told by your doctor.  Do not drink alcohol until your doctor says you can.  If it is harder than usual to remember things, write them down.  If you are easily distracted, try to do one thing at a time. For example, do not try to watch TV while making dinner.  Talk with family members or close friends when you need to make important decisions.  Watch your symptoms and tell other people to do the same. Other problems (complications) can happen after a concussion. Older adults with a brain injury may have a higher risk of serious problems, such as a blood clot in the brain.  Tell your teachers, school nurse, school counselor, coach, Product/process development scientist, or work Freight forwarder about your injury and symptoms. Tell them about what you can or cannot do. They should watch for: ? More problems with attention or concentration. ? More trouble remembering or learning new information. ? More time needed to do tasks or assignments. ? Being more annoyed (irritable) or having a harder time dealing with stress. ? Any other symptoms that get worse.  Keep all follow-up visits as told by your health care provider. This is important. Prevention  It is very important that you donot get another brain injury, especially before you have healed. In rare cases, another injury can cause permanent brain damage, brain swelling, or death. You have the most risk if you get another head injury in the first 7-10 days after you were hurt before. To avoid injuries: ? Wear a seat belt when you ride in a car. ? Do not drink too much alcohol. ? Avoid activities that could make you get a second concussion, like contact sports. ? Wear a helmet when you do activities like:  Biking.  Skiing.  Skateboarding.  Skating. ? Make your home safe by:  Removing things from the floor or stairs  that could make you trip.  Using grab bars in bathrooms and handrails by stairs.  Placing non-slip mats on floors and in bathtubs.  Putting more light in dark areas. Contact a doctor if:  Your symptoms get worse.  You have new symptoms.  You keep having symptoms for more than 2 weeks. Get help right away if:  You have bad headaches, or your headaches get worse.  You have weakness in any part of your body.  You have loss of feeling (numbness).  You feel off balance.  You keep throwing up (  vomiting).  You feel more sleepy.  The black center of one eye (pupil) is bigger than the other one.  You twitch or shake violently (convulse) or have a seizure.  Your speech is not clear (is slurred).  You feel more tired, more confused, or more annoyed.  You do not recognize people or places.  You have neck pain.  It is hard to wake you up.  You have strange behavior changes.  You pass out (lose consciousness). Summary  A concussion is a brain injury from a direct hit (blow) to the head or body.  This condition is treated with rest and careful watching of symptoms.  If you keep having symptoms for more than 2 weeks, call your doctor. This information is not intended to replace advice given to you by your health care provider. Make sure you discuss any questions you have with your health care provider. Document Released: 04/15/2009 Document Revised: 04/11/2016 Document Reviewed: 04/11/2016 Elsevier Interactive Patient Education  2017 [place].     Visit summary with medication list and pertinent instructions was printed for patient to review. All questions at time of visit were answered - patient instructed to contact office with any additional concerns. ER/RTC precautions were reviewed with the patient. Follow-up plan: Return for recheck Friday or Monday. If worse or change, please go to closest ER .  Note: Total time spent 40 minutes, greater than 50% of the visit  was spent face-to-face counseling and coordinating care for the following: The primary encounter diagnosis was Post-concussion vertigo. Diagnoses of Nausea and vomiting, intractability of vomiting not specified, unspecified vomiting type and Post-traumatic headache, not intractable, unspecified chronicity pattern were also pertinent to this visit.Marland Kitchen  Please note: voice recognition software was used to produce this document, and typos may escape review. Please contact me for any needed clarifications.

## 2017-03-23 ENCOUNTER — Encounter: Payer: Self-pay | Admitting: Osteopathic Medicine

## 2017-03-23 ENCOUNTER — Ambulatory Visit (INDEPENDENT_AMBULATORY_CARE_PROVIDER_SITE_OTHER): Payer: Medicare Other | Admitting: Osteopathic Medicine

## 2017-03-23 ENCOUNTER — Ambulatory Visit (INDEPENDENT_AMBULATORY_CARE_PROVIDER_SITE_OTHER): Payer: Medicare Other

## 2017-03-23 VITALS — BP 128/71 | HR 86 | Temp 97.7°F | Wt 125.0 lb

## 2017-03-23 DIAGNOSIS — R110 Nausea: Secondary | ICD-10-CM | POA: Diagnosis not present

## 2017-03-23 DIAGNOSIS — R112 Nausea with vomiting, unspecified: Secondary | ICD-10-CM | POA: Diagnosis not present

## 2017-03-23 MED ORDER — PROMETHAZINE HCL 25 MG/ML IJ SOLN
25.0000 mg | Freq: Once | INTRAMUSCULAR | Status: AC
  Administered 2017-03-23: 25 mg via INTRAMUSCULAR

## 2017-03-23 MED ORDER — ONDANSETRON 8 MG PO TBDP: 8.0000 mg | ORAL_TABLET | Freq: Three times a day (TID) | ORAL | 1 refills | Status: DC | PRN

## 2017-03-23 NOTE — Progress Notes (Signed)
HPI: Sierra [name] is a 83 y.o. female who presents to [facility] today, 03/23/17, for chief complaint of:  Chief Complaint  Patient presents with  . Nausea    Nausea - daughter states it was just today (see email) but patient and husband report it has been much longer than that. He is worried it might be her medicines. She reports nausea and dry heaves without other symptoms - no abdominal pain, regular BM last one 1-2 days ago, no fever/chills, no headache or dizziness, no fever, no cough, no chest pain, no trouble breathing. She was seen for similar nausea dn dizziness complaints a few weeks ago.   Patient is accompanied by husband who assists with history-taking.   Past medical, surgical, social and family history reviewed:  Patient Active Problem List   Diagnosis Date Noted  . Post-concussion vertigo 03/10/2017  . Post concussive syndrome 03/06/2017  . Concussion with no loss of consciousness   . Fall   . Dementia without behavioral disturbance 03/03/2017  . Memory loss 12/17/2016  . Gait abnormality 12/17/2016  . CSF rhinorrhea 11/12/2016  . Edema extremities 07/13/2016  . Influenza due to identified novel influenza A virus with other respiratory manifestations 06/11/2016  . Influenza A with respiratory manifestations 06/11/2016  . Dyspnea on exertion 12/04/2015  . Anemia 12/12/2014  . Anxiety 12/12/2014  . Asthma 12/12/2014  . Chronic kidney disease, stage II (mild) 12/12/2014  . H/O: GI bleed 12/12/2014  . Postural lightheadedness 12/12/2014  . 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[place]) 12/12/2014  . Adrenal insufficiency ([place]) 01/03/2014  . Ulcerative colitis, chronic ([place]) 01/03/2014  . Meningioma ([place]) 06/08/2013  . Acute blood loss anemia 01/06/2013  . Malaise and fatigue 09/21/2011  . Benign hypertensive heart disease without congestive heart failure 09/21/2011  . Atrial premature contractions 09/21/2011  . Osteoarthritis of hip  07/27/2011  . DYSPNEA ON EXERTION 05/30/2010  . Hypothyroidism 05/01/2010  . Ulcerative colitis ([place]) 03/02/2010  . HOARSENESS, CHRONIC 11/05/2009  . Asthmatic bronchitis, mild persistent, uncomplicated 25/36/6440  . Hyperlipidemia 07/11/2007  . ANEMIA, IRON DEFICIENCY, MICROCYTIC 07/11/2007  . LEUKOCYTOSIS 07/11/2007  . THROMBOCYTOSIS 07/11/2007  . Essential hypertension 07/11/2007  . PHARYNGITIS, CHRONIC 07/11/2007  . Nonallergic vasomotor rhinitis 07/11/2007  . Esophageal reflux 07/11/2007  . DIVERTICULITIS, COLON 07/11/2007  . ARTHRITIS 07/11/2007  . CARDIAC MURMUR 07/11/2007  . Diverticulitis of colon 07/11/2007    Past Surgical History:  Procedure Laterality Date  . ABDOMINAL HYSTERECTOMY  1985  . APPENDECTOMY    . CRANIOTOMY    . DILATION AND CURETTAGE OF UTERUS  1967  . EYE SURGERY     2003 -RIGHT CATARACT EXTRACTED AND LEFT WAS DONE IN 2004  . FRONTALIS SUSPENSION  10-09-2010   lifting eyelids AND SECOND EYE SURGERY November 19, 2010  . meningioma resected  20O2  . PARTIAL COLECTOMY  2008  . subglottal mucocoel  2000  . THYROIDECTOMY  1986  . TONSILLECTOMY  1938  . TOTAL HIP ARTHROPLASTY  OCT 2006   right  . VESICOVAGINAL FISTULA CLOSURE W/ TAH    . WRIST TENDON LESION REMOVED 2007      Social History   Tobacco Use  . Smoking status: Never Smoker  . Smokeless tobacco: Never Used  Substance Use Topics  . Alcohol use: Yes    Comment: occ glass of wine    Family History  Problem Relation Age of Onset  . Heart attack Father        deceased  Current medication list and allergy/intolerance information reviewed:    Current Outpatient Medications  Medication Sig Dispense Refill  . acetaminophen (TYLENOL) 500 MG tablet Take 500 mg by mouth every 6 (six) hours as needed for mild pain or headache (for headaches or pain).     Marland Kitchen albuterol (PROVENTIL HFA;VENTOLIN HFA) 108 (90 Base) MCG/ACT inhaler Inhale 2 puffs into the lungs every 6 (six) hours as needed for  wheezing or shortness of breath. 1 Inhaler 5  . amLODipine (NORVASC) 2.5 MG tablet Take 2.5 mg by mouth daily.    . Artificial Tear Solution (BION TEARS) 0.1-0.3 % SOLN Place 1 drop into both eyes every morning.     . Artificial Tear Solution (GENTEAL TEARS) 0.1-0.2-0.3 % SOLN Place 1 drop into both eyes at bedtime.    . B Complex Vitamins (VITAMIN B COMPLEX PO) Take 1 tablet by mouth daily.    Marland Kitchen BIOTIN PO Take 1 tablet by mouth daily.    . Calcium Carbonate-Vitamin D3 (CALCIUM 600-D) 600-400 MG-UNIT TABS Take 1 tablet by mouth daily.    . cholecalciferol (VITAMIN D) 1000 UNITS tablet Take 1,000 Units by mouth daily.    . Cyanocobalamin (VITAMIN B-12) 2500 MCG SUBL Take 2,500 mcg by mouth daily.     Marland Kitchen donepezil (ARICEPT) 5 MG tablet Take 1 tablet (5 mg total) by mouth at bedtime. 30 tablet 2  . escitalopram (LEXAPRO) 5 MG tablet Take 5 mg by mouth daily.    Marland Kitchen HYDROcodone-acetaminophen (NORCO/VICODIN) 5-325 MG tablet Take 1 tablet by mouth every 4 (four) hours as needed for moderate pain or severe pain. 18 tablet 0  . lactase (LACTAID) 3000 UNITS tablet Take 3,000 Units by mouth daily as needed (lactose intolerant).    Marland Kitchen levothyroxine (SYNTHROID, LEVOTHROID) 75 MCG tablet Take 1 tablet (75 mcg total) by mouth daily before breakfast. 90 tablet 3  . meclizine (ANTIVERT) 25 MG tablet Take 1 tablet (25 mg total) by mouth 3 (three) times daily as needed for dizziness. 10 tablet 0  . mesalamine (LIALDA) 1.2 g EC tablet Take 2.4 g by mouth daily.    . mirabegron ER (MYRBETRIQ) 25 MG TB24 tablet Take 1 tablet (25 mg total) by mouth daily. 90 tablet 1  . Multiple Vitamins-Minerals (CENTRUM SILVER 50+WOMEN PO) Take by mouth.    . Omega-3 Fatty Acids (FISH OIL) 1000 MG CAPS Take by mouth.    . ondansetron (ZOFRAN-ODT) 8 MG disintegrating tablet Take 1 tablet (8 mg total) by mouth every 8 (eight) hours as needed for nausea or vomiting. 30 tablet 1  . predniSONE (DELTASONE) 10 MG tablet Take 10 mg by mouth  daily with breakfast.    . Probiotic Product (PROBIOTIC DAILY PO) Take 1 capsule by mouth daily.     . ranitidine (ZANTAC) 150 MG tablet Take 150 mg by mouth 2 (two) times daily.      No current facility-administered medications for this visit.     Allergies  Allergen Reactions  . Sulfamethoxazole Nausea And Vomiting  . Tape Other (See Comments)    TAPE TEARS AND BRUISES THE SKIN!!  . Biaxin [Clarithromycin] Other (See Comments)    Bitter taste  . Ciprofloxacin Rash  . Codeine Nausea And Vomiting  . Dilantin [Phenytoin] Rash  . Doxycycline Nausea Only    Very nauseated  . Lactose Intolerance (Gi) Other (See Comments)    gas  . Restasis [Cyclosporine] Other (See Comments) and Swelling    Eyes burn   . Sulfonamide Derivatives Rash  Review of Systems:  Constitutional:  No  fever, no chills, No recent illness, No unintentional weight changes.  HEENT: No  headache, no vision change, no hearing change,  Cardiac: No  chest pain, No  pressure, No palpitations  Respiratory:  No  shortness of breath. No  Cough  Gastrointestinal: No  abdominal pain, +nausea, No  vomiting,  No  blood in stool, No  diarrhea, No  constipation   Musculoskeletal: No new myalgia/arthralgia  Genitourinary: No  incontinence  Skin: No  Rash  Endocrine: No cold intolerance,    Neurologic: +generalized weakness, No  dizziness   Exam:  BP 128/71   Pulse 86   Temp 97.7 F (36.5 C) (Oral)   Wt 125 lb (56.7 kg)   BMI 22.14 kg/m   Constitutional: VS see above. General Appearance: alert, well-developed, well-nourished, NAD  Eyes: Normal lids and conjunctive, non-icteric sclera  Ears, Nose, Mouth, Throat: MMM, Normal external inspection ears/nares/mouth/lips/gums.  Neck: No masses, trachea midline. No thyroid enlargement.  Respiratory: Normal respiratory effort. no wheeze, no rhonchi, no rales  Cardiovascular: S1/S2 normal, no murmur, no rub/gallop auscultated. RRR. No lower extremity  edema. Capillary refill <3 seconds  Gastrointestinal: Nontender, no masses. Abdomen feels distended a bit and tympanic to percussion.    Musculoskeletal:  No clubbing/cyanosis of digits.   Neurological:  No tremor.   Skin: warm, dry, intact. No rash/ulcer.    Psychiatric: Normal judgment/insight. Normal mood and affect. Oriented x3.    Dg Abd 2 Views  Result Date: 03/23/2017 CLINICAL DATA:  Nausea, vomiting. EXAM: ABDOMEN - 2 VIEW COMPARISON:  Radiographs of January 03, 2014. FINDINGS: The bowel gas pattern is normal. There is no evidence of free air. No radio-opaque calculi or other significant radiographic abnormality is seen. IMPRESSION: No evidence of bowel obstruction or ileus. Electronically Signed   By: Marijo Conception, M.D.   On: 03/23/2017 16:15     ASSESSMENT/PLAN:   Nausea in adult patient - Plan: ondansetron (ZOFRAN-ODT) 8 MG disintegrating tablet, promethazine (PHENERGAN) injection 25 mg, DG Abd 2 Views, CBC with Differential/Platelet, COMPLETE METABOLIC PANEL WITH GFR, Lipase    Patient Instructions  STOP the following medications which can cause nausea:  Donepezil (Aricept)  Escitalopram (Lexapro)  Mirabegron (Myrbetriq)  START the following medication to control nausea, up to three times per day, take 30-60 minutes before trying to eat or take medicines   Ondansetron (Zofran)  Labs and Xray today  If no answers from labs or Xray, will need to consider further testing or referral to a specialist if these symptoms persist     Visit summary with medication list and pertinent instructions was printed for patient to review. All questions at time of visit were answered - patient instructed to contact office with any additional concerns. ER/RTC precautions were reviewed with the patient. Follow-up plan: Return for recheck nausea if no better 1-2 days .  Note: Total time spent 25 minutes, greater than 50% of the visit was spent face-to-face counseling and  coordinating care for the following: The primary encounter diagnosis was Nausea in adult patient. A diagnosis of Post-concussion vertigo was also pertinent to this visit.Marland Kitchen  Please note: voice recognition software was used to produce this document, and typos may escape review. Please contact Dr. Sheppard Coil for any needed clarifications.

## 2017-03-23 NOTE — Patient Instructions (Addendum)
STOP the following medications which can cause nausea:  Donepezil (Aricept)  Escitalopram (Lexapro)  Mirabegron (Myrbetriq)  START the following medication to control nausea, up to three times per day, take 30-60 minutes before trying to eat or take medicines   Ondansetron (Zofran)  Labs and Xray today  If no answers from labs or Xray, will need to consider further testing or referral to a specialist if these symptoms persist

## 2017-03-24 LAB — CBC WITH DIFFERENTIAL/PLATELET
BASOS PCT: 0.4 %
Basophils Absolute: 49 cells/uL (ref 0–200)
EOS ABS: 427 {cells}/uL (ref 15–500)
EOS PCT: 3.5 %
HCT: 40.2 % (ref 35.0–45.0)
Hemoglobin: 13.3 g/dL (ref 11.7–15.5)
Lymphs Abs: 1708 cells/uL (ref 850–3900)
MCH: 27.5 pg (ref 27.0–33.0)
MCHC: 33.1 g/dL (ref 32.0–36.0)
MCV: 83.1 fL (ref 80.0–100.0)
MONOS PCT: 9 %
MPV: 9.6 fL (ref 7.5–12.5)
NEUTROS PCT: 73.1 %
Neutro Abs: 8918 cells/uL — ABNORMAL HIGH (ref 1500–7800)
PLATELETS: 436 10*3/uL — AB (ref 140–400)
RBC: 4.84 10*6/uL (ref 3.80–5.10)
RDW: 13.5 % (ref 11.0–15.0)
TOTAL LYMPHOCYTE: 14 %
WBC: 12.2 10*3/uL — ABNORMAL HIGH (ref 3.8–10.8)
WBCMIX: 1098 {cells}/uL — AB (ref 200–950)

## 2017-03-24 LAB — COMPLETE METABOLIC PANEL WITH GFR
AG RATIO: 1.2 (calc) (ref 1.0–2.5)
ALT: 11 U/L (ref 6–29)
AST: 14 U/L (ref 10–35)
Albumin: 4 g/dL (ref 3.6–5.1)
Alkaline phosphatase (APISO): 71 U/L (ref 33–130)
BUN/Creatinine Ratio: 17 (calc) (ref 6–22)
BUN: 16 mg/dL (ref 7–25)
CALCIUM: 9.6 mg/dL (ref 8.6–10.4)
CO2: 26 mmol/L (ref 20–32)
CREATININE: 0.96 mg/dL — AB (ref 0.60–0.88)
Chloride: 97 mmol/L — ABNORMAL LOW (ref 98–110)
GFR, EST AFRICAN AMERICAN: 62 mL/min/{1.73_m2} (ref >=60)
GFR, EST NON AFRICAN AMERICAN: 54 mL/min/{1.73_m2} — AB (ref >=60)
GLUCOSE: 178 mg/dL — AB (ref 65–99)
Globulin: 3.3 g/dL (calc) (ref 1.9–3.7)
Potassium: 3.7 mmol/L (ref 3.5–5.3)
Sodium: 135 mmol/L (ref 135–146)
TOTAL PROTEIN: 7.3 g/dL (ref 6.1–8.1)
Total Bilirubin: 0.4 mg/dL (ref 0.2–1.2)

## 2017-03-24 LAB — LIPASE: Lipase: 157 U/L — ABNORMAL HIGH (ref 7–60)

## 2017-03-25 ENCOUNTER — Other Ambulatory Visit: Payer: Self-pay

## 2017-03-25 ENCOUNTER — Emergency Department (HOSPITAL_COMMUNITY)
Admission: EM | Admit: 2017-03-25 | Discharge: 2017-03-26 | Disposition: A | Discharge status: Home / Self Care | Payer: Medicare Other | Attending: Emergency Medicine | Admitting: Emergency Medicine

## 2017-03-25 ENCOUNTER — Encounter (HOSPITAL_COMMUNITY): Payer: Self-pay | Admitting: *Deleted

## 2017-03-25 ENCOUNTER — Encounter: Payer: Self-pay | Admitting: Osteopathic Medicine

## 2017-03-25 DIAGNOSIS — E039 Hypothyroidism, unspecified: Secondary | ICD-10-CM | POA: Insufficient documentation

## 2017-03-25 DIAGNOSIS — D649 Anemia, unspecified: Secondary | ICD-10-CM | POA: Diagnosis not present

## 2017-03-25 DIAGNOSIS — F0390 Unspecified dementia without behavioral disturbance: Secondary | ICD-10-CM | POA: Insufficient documentation

## 2017-03-25 DIAGNOSIS — R1084 Generalized abdominal pain: Secondary | ICD-10-CM | POA: Insufficient documentation

## 2017-03-25 DIAGNOSIS — G8929 Other chronic pain: Secondary | ICD-10-CM | POA: Insufficient documentation

## 2017-03-25 DIAGNOSIS — I509 Heart failure, unspecified: Secondary | ICD-10-CM | POA: Insufficient documentation

## 2017-03-25 DIAGNOSIS — N182 Chronic kidney disease, stage 2 (mild): Secondary | ICD-10-CM | POA: Diagnosis not present

## 2017-03-25 DIAGNOSIS — J45909 Unspecified asthma, uncomplicated: Secondary | ICD-10-CM | POA: Diagnosis not present

## 2017-03-25 DIAGNOSIS — Z79899 Other long term (current) drug therapy: Secondary | ICD-10-CM | POA: Insufficient documentation

## 2017-03-25 DIAGNOSIS — R112 Nausea with vomiting, unspecified: Secondary | ICD-10-CM | POA: Diagnosis present

## 2017-03-25 DIAGNOSIS — E876 Hypokalemia: Secondary | ICD-10-CM | POA: Diagnosis not present

## 2017-03-25 DIAGNOSIS — I130 Hypertensive heart and chronic kidney disease with heart failure and stage 1 through stage 4 chronic kidney disease, or unspecified chronic kidney disease: Secondary | ICD-10-CM | POA: Diagnosis not present

## 2017-03-25 DIAGNOSIS — K8689 Other specified diseases of pancreas: Secondary | ICD-10-CM | POA: Insufficient documentation

## 2017-03-25 DIAGNOSIS — R110 Nausea: Secondary | ICD-10-CM

## 2017-03-25 DIAGNOSIS — N390 Urinary tract infection, site not specified: Secondary | ICD-10-CM | POA: Diagnosis not present

## 2017-03-25 LAB — COMPREHENSIVE METABOLIC PANEL
ALBUMIN: 3.5 g/dL (ref 3.5–5.0)
ALK PHOS: 73 U/L (ref 38–126)
ALT: 15 U/L (ref 14–54)
AST: 21 U/L (ref 15–41)
Anion gap: 8 (ref 5–15)
BUN: 23 mg/dL — AB (ref 6–20)
CALCIUM: 9.3 mg/dL (ref 8.9–10.3)
CO2: 26 mmol/L (ref 22–32)
CREATININE: 1.2 mg/dL — AB (ref 0.44–1.00)
Chloride: 98 mmol/L — ABNORMAL LOW (ref 101–111)
GFR calc Af Amer: 46 mL/min — ABNORMAL LOW (ref >60)
GFR calc non Af Amer: 40 mL/min — ABNORMAL LOW (ref >60)
GLUCOSE: 174 mg/dL — AB (ref 65–99)
Potassium: 3.2 mmol/L — ABNORMAL LOW (ref 3.5–5.1)
SODIUM: 132 mmol/L — AB (ref 135–145)
TOTAL PROTEIN: 7.4 g/dL (ref 6.5–8.1)
Total Bilirubin: 0.7 mg/dL (ref 0.3–1.2)

## 2017-03-25 LAB — CBC
HEMATOCRIT: 38.4 % (ref 36.0–46.0)
Hemoglobin: 12.5 g/dL (ref 12.0–15.0)
MCH: 28.4 pg (ref 26.0–34.0)
MCHC: 32.6 g/dL (ref 30.0–36.0)
MCV: 87.3 fL (ref 78.0–100.0)
Platelets: 347 10*3/uL (ref 150–400)
RBC: 4.4 MIL/uL (ref 3.87–5.11)
RDW: 14.8 % (ref 11.5–15.5)
WBC: 14.4 10*3/uL — ABNORMAL HIGH (ref 4.0–10.5)

## 2017-03-25 LAB — LIPASE, BLOOD: Lipase: 24 U/L (ref 11–51)

## 2017-03-25 MED ORDER — SODIUM CHLORIDE 0.9 % IV BOLUS (SEPSIS)
1000.0000 mL | Freq: Once | INTRAVENOUS | Status: AC
  Administered 2017-03-25: 1000 mL via INTRAVENOUS

## 2017-03-25 MED ORDER — POTASSIUM CHLORIDE 10 MEQ/100ML IV SOLN
10.0000 meq | Freq: Once | INTRAVENOUS | Status: AC
  Administered 2017-03-25: 10 meq via INTRAVENOUS
  Filled 2017-03-25: qty 100

## 2017-03-25 MED ORDER — THIAMINE HCL 100 MG/ML IJ SOLN
100.0000 mg | Freq: Once | INTRAMUSCULAR | Status: AC
  Administered 2017-03-25: 100 mg via INTRAVENOUS
  Filled 2017-03-25: qty 2

## 2017-03-25 NOTE — ED Triage Notes (Signed)
Pt presents with nausea and vomiting that she has been seen and treated for several times. PMD concerned about elevated Lipase and slightly elevated WBC. Pt has taken Zofran and Phenergan without relief.

## 2017-03-25 NOTE — ED Provider Notes (Signed)
Complains of nausea and vomiting for the past 1-2 weeks.  She denies any abdominal pain denies fever denies chest pain.  Her daughter reports she had diminished appetite.  Her associated symptoms include thirst and generalized weakness.  On exam alert not ill-appearing lungs clear to auscultation heart regular rate and rhythm abdomen nondistended nontender.  Patient was noted to have elevated lipase 2 days ago of 157.  Lipase is normalized.  Will CT scan abdomen light of persistent nausea   Orlie Dakin, MD 03/25/17 [phone]

## 2017-03-25 NOTE — ED Provider Notes (Signed)
[facility] DEPT Provider Note   CSN: [id number] Arrival date & time: 03/25/17  2054     History   Chief Complaint Chief Complaint  Patient presents with  . Emesis  . Nausea    HPI Sierra [name] is a 83 y.o. female with a hx of ulcerative colitis, diverticulitis, hypothyroidism, CKD, and HTN presents to the ED with complaints of N/V x 1 week. Patient has had intermittent issues with N/V since fall with head injury 03/06/17- had head CT negative for acute intracranial injury 10/31. Reports she has been dry heaving more so than vomiting, drinking small amounts of fluids, and eating very minimally. Reports associated loose stools without blood or mucous- states this is an intermittent chronic problem for her. Has had some increased urinary frequency. Saw PCP for N/V 11/13- given zofran and phenergan which patient has been taking intermittently with some relief. She has been hesitant to take meds due to concern for being able to keep them down. PCP performed labs at visit 2 days prior including CBC, BMP, and lipase. Lipase elevated at 157, WBC count elevated at 12.2. Additionally performed abdominal xray which revealed no evidence of bowel obstruction or ileus. Stopped new meds including Lexapro, Aricept, Meclizine, and Myrbetriq to see if this would help with nausea. Patient denies abdominal pain, constipation, fever, chills, hematochezia, dysuria, vaginal bleeding, chest pain, or dyspnea. History provided by patient and her daughter.   HPI  Past Medical History:  Diagnosis Date  . Acute asthmatic bronchitis   . Acute respiratory failure with hypoxia ([place]) 06/11/2016  . Allergic rhinitis   . Arthritis    Hx R shoulder bursitis, pain L knee and L hip  . Asthmatic bronchitis   . ATYPICAL MYCOBACTERIAL INFECTION 05/05/2010   Sputum cx Pos 12/ 2011    . Balance problem    SINCE BRAIN TUMOR REMOVED IN 2002-BENIGN TUMOR-PT HAS ADRENAL INSUFFICIENCY AND TAKES  DAILY PREDNISONE  . Blood transfusion   . Cardiac murmur    DOES NOT CAUSE ANY SYMPTOMS  . Chronic kidney disease   . Chronic pharyngitis   . Colonic diverticular abscess 02/01/2017  . Diverticulitis, colon   . Dizziness   . DOE (dyspnea on exertion)   . Esophageal reflux   . GI bleed 01/06/2013  . Hyperlipemia   . Hypertension   . Hypothyroidism   . IBS (irritable bowel syndrome)   . Leukocytosis   . LGI bleed 01/05/2013  . Other specified iron deficiency anemias   . PNEUMONIA 06/02/2010   Qualifier: Diagnosis of  By: Annamaria Boots MD, Clinton D   . Postop Hyponatremia 07/28/2011  . Rectal bleeding 01/03/2014  . Recurrent upper respiratory infection (URI) 07/10/2011    ACUTE BRONCHITIS - extra prednisone in addition to the daily prednisone and a Z-Pak  . Thrombocytosis ([place])   . THRUSH 04/30/2009   Qualifier: Diagnosis of  By: Annamaria Boots MD, Clinton D   . Ulceration, colon   . Ulcerative colitis     Patient Active Problem List   Diagnosis Date Noted  . Pancreatic mass 03/25/2017  . Post-concussion vertigo 03/10/2017  . Post concussive syndrome 03/06/2017  . Concussion with no loss of consciousness   . Fall   . Dementia without behavioral disturbance 03/03/2017  . Memory loss 12/17/2016  . Gait abnormality 12/17/2016  . CSF rhinorrhea 11/12/2016  . Edema extremities 07/13/2016  . Influenza due to identified novel influenza A virus with other respiratory manifestations 06/11/2016  . Influenza A with  respiratory manifestations 06/11/2016  . Dyspnea on exertion 12/04/2015  . Anemia 12/12/2014  . Anxiety 12/12/2014  . Asthma 12/12/2014  . Chronic kidney disease, stage II (mild) 12/12/2014  . H/O: GI bleed 12/12/2014  . Postural lightheadedness 12/12/2014  . 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[place]) 12/12/2014  . Adrenal insufficiency ([place]) 01/03/2014  . Ulcerative colitis, chronic ([place]) 01/03/2014  . Meningioma ([place]) 06/08/2013  . Acute blood loss anemia 01/06/2013  . Malaise and fatigue  09/21/2011  . Benign hypertensive heart disease without congestive heart failure 09/21/2011  . Atrial premature contractions 09/21/2011  . Osteoarthritis of hip 07/27/2011  . DYSPNEA ON EXERTION 05/30/2010  . Hypothyroidism 05/01/2010  . Ulcerative colitis ([place]) 03/02/2010  . HOARSENESS, CHRONIC 11/05/2009  . Asthmatic bronchitis, mild persistent, uncomplicated 74/12/1446  . Hyperlipidemia 07/11/2007  . ANEMIA, IRON DEFICIENCY, MICROCYTIC 07/11/2007  . LEUKOCYTOSIS 07/11/2007  . THROMBOCYTOSIS 07/11/2007  . Essential hypertension 07/11/2007  . PHARYNGITIS, CHRONIC 07/11/2007  . Nonallergic vasomotor rhinitis 07/11/2007  . Esophageal reflux 07/11/2007  . DIVERTICULITIS, COLON 07/11/2007  . ARTHRITIS 07/11/2007  . CARDIAC MURMUR 07/11/2007  . Diverticulitis of colon 07/11/2007    Past Surgical History:  Procedure Laterality Date  . ABDOMINAL HYSTERECTOMY  1985  . APPENDECTOMY    . COLONOSCOPY N/A 01/05/2013   Procedure: COLONOSCOPY;  Surgeon: Cleotis Nipper, MD;  Location[facility] ENDOSCOPY;  Service: Endoscopy;  Laterality: N/A;  . COLONOSCOPY N/A 01/04/2014   Procedure: COLONOSCOPY;  Surgeon: Winfield Cunas., MD;  Location: [facility] ENDOSCOPY;  Service: Endoscopy;  Laterality: N/A;  . CRANIOTOMY    . DILATION AND CURETTAGE OF UTERUS  1967  . ESOPHAGOGASTRODUODENOSCOPY N/A 01/05/2013   Procedure: ESOPHAGOGASTRODUODENOSCOPY (EGD);  Surgeon: Cleotis Nipper, MD;  Location: [facility] ENDOSCOPY;  Service: Endoscopy;  Laterality: N/A;  . ESOPHAGOGASTRODUODENOSCOPY N/A 06/26/2014   Procedure: ESOPHAGOGASTRODUODENOSCOPY (EGD);  Surgeon: Winfield Cunas., MD;  Location: [facility] ENDOSCOPY;  Service: Endoscopy;  Laterality: N/A;  . EYE SURGERY     2003 -RIGHT CATARACT EXTRACTED AND LEFT WAS DONE IN 2004  . FLEXIBLE SIGMOIDOSCOPY N/A 06/26/2014   Procedure: FLEXIBLE SIGMOIDOSCOPY;  Surgeon: Winfield Cunas., MD;  Location: [facility] ENDOSCOPY;  Service: Endoscopy;  Laterality: N/A;  . FRONTALIS SUSPENSION   10-09-2010   lifting eyelids AND SECOND EYE SURGERY November 19, 2010  . meningioma resected  20O2  . PARTIAL COLECTOMY  2008  . subglottal mucocoel  2000  . THYROIDECTOMY  1986  . TONSILLECTOMY  1938  . TOTAL HIP ARTHROPLASTY  OCT 2006   right  . TOTAL HIP ARTHROPLASTY  07/27/2011   Procedure: TOTAL HIP ARTHROPLASTY;  Surgeon: Gearlean Alf, MD;  Location: [facility] ORS;  Service: Orthopedics;  Laterality: Left;  Marland Kitchen VESICOVAGINAL FISTULA CLOSURE W/ TAH    . WRIST TENDON LESION REMOVED 2007      OB History    No data available       Home Medications    Prior to Admission medications   Medication Sig Start Date End Date Taking? Authorizing Provider  acetaminophen (TYLENOL) 500 MG tablet Take 500-1,000 mg every 6 (six) hours as needed by mouth for mild pain or headache (for headaches or pain).    Yes [provider]  Artificial Tear Solution (BION TEARS) 0.1-0.3 % SOLN Place 1 drop into both eyes every morning.    Yes [provider]  Artificial Tear Solution (GENTEAL TEARS) 0.1-0.2-0.3 % SOLN Place 1 drop into both eyes at bedtime.   Yes [provider]  Calcium Carbonate-Vitamin  D3 (CALCIUM 600-D) 600-400 MG-UNIT TABS Take 1 tablet by mouth daily.   Yes [provider]  escitalopram (LEXAPRO) 5 MG tablet Take 5 mg by mouth daily. 02/01/17  Yes [provider]  fluticasone (FLOVENT HFA) 110 MCG/ACT inhaler Inhale 2 puffs 2 (two) times daily into the lungs.   Yes [provider]  HYDROcodone-acetaminophen (NORCO/VICODIN) 5-325 MG tablet Take 1 tablet by mouth every 4 (four) hours as needed for moderate pain or severe pain. 02/20/17  Yes Forde Dandy, MD  levothyroxine (SYNTHROID, LEVOTHROID) 75 MCG tablet Take 1 tablet (75 mcg total) by mouth daily before breakfast. 02/02/17  Yes Emeterio Reeve, DO  mesalamine (LIALDA) 1.2 g EC tablet Take 2.4 g by mouth daily. 02/08/17  Yes [provider]  Multiple Vitamins-Minerals (CENTRUM  SILVER 50+WOMEN PO) Take 1 tablet daily by mouth.    Yes [provider]  Omega-3 Fatty Acids (FISH OIL) 1000 MG CAPS Take 1 capsule daily by mouth.    Yes [provider]  predniSONE (DELTASONE) 10 MG tablet Take 10 mg by mouth daily with breakfast.   Yes [provider]  Probiotic Product (PROBIOTIC DAILY PO) Take 1 capsule by mouth daily.    Yes [provider]  ranitidine (ZANTAC) 150 MG tablet Take 150 mg by mouth 2 (two) times daily.    Yes [provider]  albuterol (PROVENTIL HFA;VENTOLIN HFA) 108 (90 Base) MCG/ACT inhaler Inhale 2 puffs into the lungs every 6 (six) hours as needed for wheezing or shortness of breath. 06/12/16 10/02/20  Baird Lyons D, MD  amLODipine (NORVASC) 2.5 MG tablet Take 2.5 mg by mouth daily.    [provider]  B Complex Vitamins (VITAMIN B COMPLEX PO) Take 1 tablet by mouth daily.    [provider]  BIOTIN PO Take 1 tablet by mouth daily.    [provider]  cholecalciferol (VITAMIN D) 1000 UNITS tablet Take 1,000 Units by mouth daily.    [provider]  Cyanocobalamin (VITAMIN B-12) 2500 MCG SUBL Take 2,500 mcg by mouth daily.     [provider]  donepezil (ARICEPT) 5 MG tablet Take 1 tablet (5 mg total) by mouth at bedtime. Patient not taking: Reported on 03/25/2017 03/03/17   Emeterio Reeve, DO  hydrochlorothiazide (MICROZIDE) 12.5 MG capsule  12/23/16   [provider]  lactase (LACTAID) 3000 UNITS tablet Take 3,000 Units by mouth daily as needed (lactose intolerant).    [provider]  meclizine (ANTIVERT) 25 MG tablet Take 1 tablet (25 mg total) by mouth 3 (three) times daily as needed for dizziness. Patient not taking: Reported on 03/25/2017 03/07/17   Charlynne Cousins, MD  mirabegron ER (MYRBETRIQ) 25 MG TB24 tablet Take 1 tablet (25 mg total) by mouth daily. Patient not taking: Reported on 03/25/2017 03/05/17   Emeterio Reeve, DO    ondansetron (ZOFRAN-ODT) 8 MG disintegrating tablet Take 1 tablet (8 mg total) every 8 (eight) hours as needed by mouth for nausea or vomiting. 03/23/17   Emeterio Reeve, DO    Family History Family History  Problem Relation Age of Onset  . Heart attack Father        deceased    Social History Social History   Tobacco Use  . Smoking status: Never Smoker  . Smokeless tobacco: Never Used  Substance Use Topics  . Alcohol use: Yes    Comment: occ glass of wine  . Drug use: No     Allergies  Sulfamethoxazole; Tape; Biaxin [clarithromycin]; Ciprofloxacin; Codeine; Dilantin [phenytoin]; Doxycycline; Lactose intolerance (gi); Restasis [cyclosporine]; and Sulfonamide derivatives   Review of Systems Review of Systems  Constitutional: Positive for appetite change and fatigue. Negative for chills and fever.  HENT: Positive for rhinorrhea. Negative for ear pain, sore throat and tinnitus.   Eyes: Negative for visual disturbance.  Respiratory: Negative for chest tightness and shortness of breath.   Cardiovascular: Negative for chest pain, palpitations and leg swelling.  Gastrointestinal: Positive for diarrhea (Loose stools, non bloody), nausea and vomiting. Negative for abdominal pain and blood in stool.  Genitourinary: Negative for dysuria, flank pain and vaginal bleeding.  Musculoskeletal: Negative for back pain.       L hand pain/swelling- being seen by orthopedics- was told this was likely related to CRPS at appointment this morning.   Skin: Negative for rash.  Neurological: Positive for weakness (generalized) and light-headedness. Negative for syncope, numbness and headaches.       Positive for paresthesias to L foot toes- intermittent for past few weeks.   Psychiatric/Behavioral: Negative for confusion.  All other systems reviewed and are negative.    Physical Exam Updated Vital Signs BP 116/65 (BP Location: Right Arm)   Pulse (!) 101   Temp 97.8 F (36.6 C) (Oral)    Resp 18   Ht 5\' 3"  (1.6 m)   Wt 56.7 kg (125 lb)   SpO2 95%   BMI 22.14 kg/m   Physical Exam  Constitutional: She appears well-developed and well-nourished. No distress.  HENT:  Head: Normocephalic and atraumatic.  Mouth/Throat: Mucous membranes are dry.  Eyes: Conjunctivae and EOM are normal. Pupils are equal, round, and reactive to light. Right eye exhibits no discharge. Left eye exhibits no discharge.  Neck: Normal range of motion. Neck supple.  Cardiovascular: Normal rate and regular rhythm. Exam reveals no gallop and no friction rub.  Murmur (heard best at 2nd ICS, RSB) heard.  Systolic murmur is present with a grade of 2/6. Pulses:      Radial pulses are 2+ on the right side, and 2+ on the left side.       Posterior tibial pulses are 2+ on the right side, and 2+ on the left side.  Pulmonary/Chest: Breath sounds normal. No respiratory distress. She has no wheezes. She has no rales.  Abdominal: Soft. Normal appearance and bowel sounds are normal. She exhibits no distension. There is no tenderness. There is no rigidity, no rebound, no guarding and no tenderness at McBurney's point.  Neurological: She is alert.  Clear speech.   Skin: Skin is warm and dry. No rash noted. There is erythema (L hand).  Psychiatric: She has a normal mood and affect. Her behavior is normal.  Nursing note and vitals reviewed.    ED Treatments / Results   Results for orders placed or performed during the hospital encounter of 03/25/17  Lipase, blood  Result Value Ref Range   Lipase 24 11 - 51 U/L  Comprehensive metabolic panel  Result Value Ref Range   Sodium 132 (L) 135 - 145 mmol/L   Potassium 3.2 (L) 3.5 - 5.1 mmol/L   Chloride 98 (L) 101 - 111 mmol/L   CO2 26 22 - 32 mmol/L   Glucose, Bld 174 (H) 65 - 99 mg/dL   BUN 23 (H) 6 - 20 mg/dL   Creatinine, Ser 1.20 (H) 0.44 - 1.00 mg/dL   Calcium 9.3 8.9 - 10.3 mg/dL   Total Protein 7.4 6.5 - 8.1 g/dL  Albumin 3.5 3.5 - 5.0 g/dL   AST 21 15 -  41 U/L   ALT 15 14 - 54 U/L   Alkaline Phosphatase 73 38 - 126 U/L   Total Bilirubin 0.7 0.3 - 1.2 mg/dL   GFR calc non Af Amer 40 (L) >60 mL/min   GFR calc Af Amer 46 (L) >60 mL/min   Anion gap 8 5 - 15  CBC  Result Value Ref Range   WBC 14.4 (H) 4.0 - 10.5 K/uL   RBC 4.40 3.87 - 5.11 MIL/uL   Hemoglobin 12.5 12.0 - 15.0 g/dL   HCT 38.4 36.0 - 46.0 %   MCV 87.3 78.0 - 100.0 fL   MCH 28.4 26.0 - 34.0 pg   MCHC 32.6 30.0 - 36.0 g/dL   RDW 14.8 11.5 - 15.5 %   Platelets 347 150 - 400 K/uL  Urinalysis, Routine w reflex microscopic  Result Value Ref Range   Color, Urine YELLOW YELLOW   APPearance CLEAR CLEAR   Specific Gravity, Urine 1.006 1.005 - 1.030   pH 5.0 5.0 - 8.0   Glucose, UA NEGATIVE NEGATIVE mg/dL   Hgb urine dipstick MODERATE (A) NEGATIVE   Bilirubin Urine NEGATIVE NEGATIVE   Ketones, ur NEGATIVE NEGATIVE mg/dL   Protein, ur NEGATIVE NEGATIVE mg/dL   Nitrite NEGATIVE NEGATIVE   Leukocytes, UA MODERATE (A) NEGATIVE   RBC / HPF 0-5 0 - 5 RBC/hpf   WBC, UA TOO NUMEROUS TO COUNT 0 - 5 WBC/hpf   Bacteria, UA NONE SEEN NONE SEEN   Squamous Epithelial / LPF NONE SEEN NONE SEEN   Mucus PRESENT    Hyaline Casts, UA PRESENT   Troponin I  Result Value Ref Range   Troponin I <0.03 <0.03 ng/mL  Magnesium  Result Value Ref Range   Magnesium 2.0 1.7 - 2.4 mg/dL    EKG  EKG Interpretation  Date/Time:  Thursday March 25 2017 23:33:31 EST Ventricular Rate:  84 PR Interval:    QRS Duration: 112 QT Interval:  394 QTC Calculation: 466 R Axis:   86 Text Interpretation:  Sinus rhythm Borderline intraventricular conduction delay Low voltage, precordial leads No significant change since last tracing Confirmed by Orlie Dakin [phone]) on 03/25/2017 11:42:59 PM       Radiology Ct Abdomen Pelvis W Contrast  Result Date: 03/26/2017 CLINICAL DATA:  Acute onset of nausea and vomiting.  Leukocytosis. EXAM: CT ABDOMEN AND PELVIS WITH CONTRAST TECHNIQUE: Multidetector CT  imaging of the abdomen and pelvis was performed using the standard protocol following bolus administration of intravenous contrast. CONTRAST:  75 mL of Isovue 300 IV contrast COMPARISON:  CT of the abdomen and pelvis from 04/16/2015 FINDINGS: Lower chest: Dense calcification is noted at the mitral valve. Minimal scarring is noted at the lung bases. Hepatobiliary: Mild nonspecific hypodensity is noted within the liver at the porta hepatis. The liver is otherwise unremarkable. The gallbladder is unremarkable in appearance. The common bile duct remains normal in caliber. Pancreas: A prominent 3.2 cm cystic lesion is noted at the pancreatic body, increased in size from 2016. A 1.0 cm heterogeneous lesion is again noted at the pancreatic tail, stable from 2016. Spleen: A 3.5 cm nonspecific hypodense lesion is noted at the spleen. Adrenals/Urinary Tract: The adrenal glands are unremarkable in appearance. Scattered bilateral renal cysts are seen. A few small nonobstructing right renal stones measure up to 3 mm in size. There is no evidence of hydronephrosis. No obstructing ureteral stones are seen. No perinephric stranding  is appreciated. Mild scarring is noted at the interpole region of the left kidney. Stomach/Bowel: The stomach is unremarkable in appearance. The small bowel is within normal limits. The patient is status post appendectomy. Scattered diverticulosis is noted along the transverse and descending colon. There is no evidence of diverticulitis. Vascular/Lymphatic: Scattered calcification is seen along the abdominal aorta and its branches. The abdominal aorta is otherwise grossly unremarkable. The inferior vena cava is grossly unremarkable. No retroperitoneal lymphadenopathy is seen. No pelvic sidewall lymphadenopathy is identified. Reproductive: The bladder is moderately distended and grossly unremarkable. The patient is status post hysterectomy. No suspicious adnexal masses are seen. Other: No additional soft  tissue abnormalities are seen. Musculoskeletal: No acute osseous abnormalities are identified. Bilateral hip arthroplasties are grossly unremarkable in appearance. A chronic left-sided pars defect is noted at L5, without evidence of anterolisthesis. Vacuum phenomenon is noted at L3-L4. The visualized musculature is unremarkable in appearance. IMPRESSION: 1. No acute abnormality seen to explain the patient's symptoms. 2. 3.2 cm cystic lesion at the pancreatic body has increased in size from 2016. MRCP could be considered further evaluation; would correlate with pancreatic labs. 3. 3.5 cm nonspecific hypodense lesion at the spleen. 4. Mild nonspecific hypodensity within the liver at the porta hepatis. 5. Dense calcification at the mitral valve. 6. Scattered bilateral renal cysts. Small nonobstructing right renal stones measure up to 3 mm in size. Mild left renal scarring. 7. Scattered diverticulosis along the transverse and descending colon, without evidence of diverticulitis. 8. Chronic left-sided pars defect at L5, without evidence of anterolisthesis. Aortic Atherosclerosis (ICD10-I70.0). Electronically Signed   By: Garald Balding M.D.   On: 03/26/2017 01:32    Medications Ordered in ED Medications  cefTRIAXone (ROCEPHIN) 1 g in dextrose 5 % 50 mL IVPB (1 g Intravenous New Bag/Given 03/26/17 0117)  sodium chloride 0.9 % bolus 1,000 mL (1,000 mLs Intravenous New Bag/Given 03/25/17 2327)  thiamine (B-1) injection 100 mg (100 mg Intravenous Given 03/25/17 2339)  potassium chloride 10 mEq in 100 mL IVPB (0 mEq Intravenous Stopped 03/26/17 0115)  iopamidol (ISOVUE-300) 61 % injection 100 mL (75 mLs Intravenous Contrast Given 03/26/17 0048)  ondansetron (ZOFRAN) injection 4 mg (4 mg Intravenous Given 03/26/17 0118)     Initial Impression / Assessment and Plan / ED Course  I have reviewed the triage vital signs and the nursing notes.  Pertinent labs & imaging results that were available during my care of  the patient were reviewed by me and considered in my medical decision making (see chart for details).  Patient seen with Dr. Winfred Leeds at time of initial presentation.    Patient presents with nausea and vomiting, she is non-toxic appearing with stable vital signs. Lab work by PCP revealed elevated lipase which has normalized.  Patient with hypokalemia today, will replace potassium. Administered IV fluids and thiamine with patient's decreased PO intake. Patient's renal function is elevated, but has been previously elevated with hx of CKD. Given patient's age and hx of HTN evaluated with EKG and troponin- given EKG is unchanged from 02/21/17 and troponin is negative- doubt ACS. Urinalysis consistent with UTI, patient is without signs of pyelonephritis- she is afebrile, no CVA tenderness, no WBC casts on UA- will treat with Rocephin while in the ED and provide prescription for Keflex. CT is negative for acute abnormality to explain patient's symptoms. On repeat exam patient does not have a surgical abdomen and there are no peritoneal signs.  No indication of appendicitis, bowel obstruction, bowel perforation, cholecystitis,  or diverticulitis.  Discussed patient's labs, imaging, and EKG with her and her daughter. Discussed treatment plan with Keflex and the need for GI follow up given CT findings and to ensure nausea has resolved. She has seen a GI doctor before that she is able to return to. Patient is able to tolerate PO. Will discharge home with abx, continued zofran, and GI follow up. Discussed ER return precautions. Patient and her daughter provided opportunity for questions, confirmed understanding, in agreement with plan.   Vitals:   03/25/17 2330 03/26/17 0110  BP: (!) 147/66 139/83  Pulse: 88 92  Resp:  20  Temp:    SpO2: 91% 96%   Final Clinical Impressions(s) / ED Diagnoses   Final diagnoses:  Acute lower UTI  Nausea    ED Discharge Orders        Ordered    cephALEXin (KEFLEX) 500  MG capsule  2 times daily     03/26/17 0138       Vici Novick, Glynda Jaeger, PA-C 03/26/17 0215    Orlie Dakin, MD 03/26/17 1715

## 2017-03-26 ENCOUNTER — Encounter (HOSPITAL_COMMUNITY): Payer: Self-pay

## 2017-03-26 ENCOUNTER — Emergency Department (HOSPITAL_COMMUNITY): Payer: Medicare Other

## 2017-03-26 ENCOUNTER — Other Ambulatory Visit: Payer: Self-pay | Admitting: Osteopathic Medicine

## 2017-03-26 LAB — URINALYSIS, ROUTINE W REFLEX MICROSCOPIC
Bacteria, UA: NONE SEEN
Bilirubin Urine: NEGATIVE
GLUCOSE, UA: NEGATIVE mg/dL
KETONES UR: NEGATIVE mg/dL
NITRITE: NEGATIVE
PH: 5 (ref 5.0–8.0)
Protein, ur: NEGATIVE mg/dL
SPECIFIC GRAVITY, URINE: 1.006 (ref 1.005–1.030)
Squamous Epithelial / LPF: NONE SEEN

## 2017-03-26 LAB — MAGNESIUM: MAGNESIUM: 2 mg/dL (ref 1.7–2.4)

## 2017-03-26 LAB — TROPONIN I: Troponin I: <0.03 ng/mL (ref <0.03)

## 2017-03-26 MED ORDER — IOPAMIDOL (ISOVUE-300) INJECTION 61%
INTRAVENOUS | Status: AC
Start: 2017-03-26 — End: 2017-03-26
  Administered 2017-03-26: 75 mL via INTRAVENOUS
  Filled 2017-03-26: qty 100

## 2017-03-26 MED ORDER — IOPAMIDOL (ISOVUE-300) INJECTION 61%
100.0000 mL | Freq: Once | INTRAVENOUS | Status: AC | PRN
  Administered 2017-03-26: 75 mL via INTRAVENOUS

## 2017-03-26 MED ORDER — CEFTRIAXONE SODIUM 1 G IJ SOLR
1.0000 g | Freq: Once | INTRAMUSCULAR | Status: AC
  Administered 2017-03-26: 1 g via INTRAVENOUS
  Filled 2017-03-26: qty 10

## 2017-03-26 MED ORDER — ONDANSETRON HCL 4 MG/2ML IJ SOLN
4.0000 mg | Freq: Once | INTRAMUSCULAR | Status: AC
  Administered 2017-03-26: 4 mg via INTRAVENOUS
  Filled 2017-03-26: qty 2

## 2017-03-26 MED ORDER — CEPHALEXIN 500 MG PO CAPS: 500.0000 mg | ORAL_CAPSULE | Freq: Two times a day (BID) | ORAL | 0 refills | Status: AC

## 2017-03-26 NOTE — ED Notes (Signed)
Pt is able to tolerate fluids and stated that she is feeling "much better."

## 2017-03-26 NOTE — Discharge Instructions (Addendum)
You were seen in the emergency department for nausea and vomiting. You received IV fluids and B1 vitamin. Your potassium was a little low, we replaced this during your visit. Your labs revealed that you have a urinary tract infection. You received treatment for this in the emergency department with an antibiotic and I am sending you home with a prescription for Keflex which is a different antibiotic.   Please take all of your antibiotics until finished. You may develop abdominal discomfort or diarrhea from the antibiotic.  You may help offset this with probiotics which you can buy at the store (ask your pharmacist if unable to find) or get probiotics in the form of eating yogurt. Do not eat or take the probiotics until 2 hours after your antibiotic. If you are unable to tolerate these side effects follow-up with your primary care provider or return to the emergency department.   If you begin to experience any blistering, rashes, swelling, or difficulty breathing seek medical care for evaluation of potentially more serious side effects.   Please be aware that this medication may interact with other medications you are taking, please be sure to discuss your medication list with your pharmacist.   Continue to take the Zofran your primary care provider prescribed you for nausea.   As discussed we would like you to follow up with a GI doctor regarding the cyst on your pancreas and lesion on your spleen. Call to make an appointment within  1 week.  Return to the emergency department at any time for any new/concerning or worsening symptoms.

## 2017-03-26 NOTE — Progress Notes (Signed)
See emails.

## 2017-04-04 ENCOUNTER — Encounter: Payer: Self-pay | Admitting: Osteopathic Medicine

## 2017-04-06 ENCOUNTER — Telehealth: Payer: Self-pay | Admitting: Osteopathic Medicine

## 2017-04-06 ENCOUNTER — Encounter: Payer: Self-pay | Admitting: Osteopathic Medicine

## 2017-04-06 NOTE — Telephone Encounter (Signed)
[place]

## 2017-04-06 NOTE — Telephone Encounter (Signed)
Called today to check up on Sierra [name]. Got a bit of a concerning message from her daughter about possible suicidal ideation, though from the daughter it sounds like this has been something she has commented on loosely for some time and is not actively in danger. I called the house to check up on her but the phone rang for quite a while without answering machine picking up. She has an appointment tomorrow, if she does not show up for this appointment will call again. She is under the care of her husband, they have visiting medical staff coming through fairly frequently and the daughter is quite involved as well. Please see emails for full details

## 2017-04-07 ENCOUNTER — Other Ambulatory Visit: Payer: Self-pay

## 2017-04-07 ENCOUNTER — Ambulatory Visit (INDEPENDENT_AMBULATORY_CARE_PROVIDER_SITE_OTHER): Payer: Medicare Other | Admitting: Osteopathic Medicine

## 2017-04-07 ENCOUNTER — Encounter: Payer: Self-pay | Admitting: Osteopathic Medicine

## 2017-04-07 VITALS — BP 160/82 | HR 80 | Temp 97.8°F | Resp 16 | Ht 63.5 in | Wt 128.0 lb

## 2017-04-07 DIAGNOSIS — F418 Other specified anxiety disorders: Secondary | ICD-10-CM | POA: Diagnosis not present

## 2017-04-07 DIAGNOSIS — Z87898 Personal history of other specified conditions: Secondary | ICD-10-CM | POA: Diagnosis not present

## 2017-04-07 DIAGNOSIS — F0390 Unspecified dementia without behavioral disturbance: Secondary | ICD-10-CM

## 2017-04-07 DIAGNOSIS — E2740 Unspecified adrenocortical insufficiency: Secondary | ICD-10-CM

## 2017-04-07 DIAGNOSIS — R413 Other amnesia: Secondary | ICD-10-CM | POA: Diagnosis not present

## 2017-04-07 DIAGNOSIS — R350 Frequency of micturition: Secondary | ICD-10-CM

## 2017-04-07 DIAGNOSIS — I10 Essential (primary) hypertension: Secondary | ICD-10-CM

## 2017-04-07 DIAGNOSIS — J4520 Mild intermittent asthma, uncomplicated: Secondary | ICD-10-CM

## 2017-04-07 MED ORDER — ESCITALOPRAM OXALATE 5 MG PO TABS: 5.0000 mg | ORAL_TABLET | Freq: Every day | ORAL | 1 refills | Status: DC

## 2017-04-07 MED ORDER — MIRABEGRON ER 25 MG PO TB24: 25.0000 mg | ORAL_TABLET | Freq: Every day | ORAL | 1 refills | Status: DC

## 2017-04-07 MED ORDER — DONEPEZIL HCL 5 MG PO TABS: 5.0000 mg | ORAL_TABLET | Freq: Every day | ORAL | 3 refills | Status: DC

## 2017-04-07 NOTE — Progress Notes (Signed)
HPI: Sierra [name] is a 83 y.o. female who  has a past medical history of Acute asthmatic bronchitis, Acute respiratory failure with hypoxia ([place]) (06/11/2016), Allergic rhinitis, Arthritis, Asthmatic bronchitis, ATYPICAL MYCOBACTERIAL INFECTION (05/05/2010), Balance problem, Blood transfusion, Cardiac murmur, Chronic kidney disease, Chronic pharyngitis, Colonic diverticular abscess (02/01/2017), Diverticulitis, colon, Dizziness, DOE (dyspnea on exertion), Esophageal reflux, GI bleed (01/06/2013), Hyperlipemia, Hypertension, Hypothyroidism, IBS (irritable bowel syndrome), Leukocytosis, LGI bleed (01/05/2013), Other specified iron deficiency anemias, PNEUMONIA (06/02/2010), Postop Hyponatremia (07/28/2011), Rectal bleeding (01/03/2014), Recurrent upper respiratory infection (URI) (07/10/2011), Thrombocytosis ([place]), THRUSH (04/30/2009), Ulceration, colon, and Ulcerative colitis.  she presents to [facility] today, 04/07/17,  for chief complaint of:  Chief Complaint  Patient presents with  . medication questions    Patient is here today with her husband and with some questions about medications. She brings all pill bottles and even has a poster board with pills glued on next to their names and the times of day she is taking them. Genius!   Been getting a good bit of correspondence from her daughter with concerns about her mom's ability to care for herself/take pills, husband also has some concerns. Recently there was also some concern for depression/mood swings and thoughts that she "didn't want to be alive." When I asked the patient to give me some more details about why her family might be concerned about these issues, she states that she feels fine and is kind of at a loss for why anyone would be worried. She is fairly self-aware of her memory issues but it is clearly a sore subject. She gets a little bit irritable when talking with her husband about memory issues. She  insists she does not take medications without his assistance, she doesn't think that she is taking medications at inappropriate times, or missing doses.  Speaking to her husband privately, he does note some concern with mood swings. For instance he was out of the house for something or down in the garage and she became incredibly irate with him for not being where she could see him, other times he can leave the house for a while and there is no issue. He is not concerned about suicidality.  Hypertension: Blood pressure high today, she does not have blood pressure medicines on her chart of medications she is taking. We've gone back and forth in the past a few times about elevated blood pressures versus low blood pressures. She states that blood pressures at home have looked fine but does not have numbers with them.  Dementia: Aricept does not seem to be causing any adverse effects. No major changes in memory but sounds like there is some mood issues according to the daughter and the husband.  Urinary: She would like to stay on the Myrbetriq, she gets up about once per night to go to the bathroom  Patient is accompanied by husband who assists with history-taking.    Past medical, surgical, social and family history reviewed: nothing to update  Current medication list and allergy/intolerance information reviewed and updated as appropriate.       Review of Systems:  Constitutional:  No  fever, no chills, No recent illness,  HEENT: No  headache, no vision change,  Cardiac: No  chest pain, No  pressure, No palpitations,  Respiratory:  No  shortness of breath. No  Cough  Genitourinary: No  incontinence  Skin: No  Rash  Neurologic: No  weakness, No  dizziness,  Psychiatric: No  concerns  with depression, No  concerns with anxiety, No sleep problems, No mood problems... Per patient  Exam:  BP (!) 160/82   Pulse 80   Temp 97.8 F (36.6 C)   Resp 16   Ht 5' 3.5" (1.613 m)   Wt 128  lb (58.1 kg)   SpO2 96%   BMI 22.32 kg/m   Constitutional: VS see above. General Appearance: alert, well-developed, well-nourished, NAD  Eyes: Normal lids and conjunctive, non-icteric sclera  Ears, Nose, Mouth, Throat: MMM, Normal external inspection ears/nares/mouth/lips/gums.   Neck: No masses, trachea midline. No thyroid enlargement. No tenderness/mass appreciated. No lymphadenopathy  Respiratory: Normal respiratory effort. no wheeze, no rhonchi, no rales  Cardiovascular: S1/S2 normal, no murmur, no rub/gallop auscultated. RRR. No lower extremity edema.   Gastrointestinal: Nontender, no masses.   Musculoskeletal: Gait normal.   Neurological: Normal balance/coordination. No tremor. No cranial nerve deficit on limited exam. Motor and sensation intact and symmetric.   Skin: warm, dry, intact. No rash/ulcer.  Psychiatric: Normal judgment/insight. Normal mood and affect. Oriented x3.      ASSESSMENT/PLAN: The primary encounter diagnosis was Dementia without behavioral disturbance, unspecified dementia type. Diagnoses of Anxious depression, Memory loss, Essential hypertension, History of prediabetes, Urinary frequency, Mild intermittent asthma, unspecified whether complicated, and Adrenal insufficiency ([place]) were also pertinent to this visit.  We had a long discussion today about medications and mood/depression.  I believe with her dementia, moods are probably fairly labile, she has a tendency to get agitated at times and at other times seems totally normal.  Today in the office, she is very pleasant, alert, oriented to person place day month and year.  He is fairly self aware of her memory issues but this is certainly a sore subject between her and her family.  As far as blood pressure is concerned, she does not have any home blood pressures to report.  Blood pressure is been a bit up and down.  Given blood pressures in the office, I would advise that she be on at least a low-dose  of something but she is reluctant to add any medications at this point.  Husband and I discussed risks versus benefits of uncontrolled hypertension versus medications.  Blood pressure goal for her would probably be around 140/90 or so.     Patient Instructions  Plan:  As far as her medications are concerned, everything that is on the board I think it is fine to be taking.  The only thing I would add is be sure that she is on her blood pressure medicine. I think the amlodipine is probably the safest option for her, please be sure that this is restarted. Doesn't matter what time of day this is taken. Please be checking blood pressure at home, our goal is 140/90 or close to that.  I would have a frank discussion as a family with your daughter, [name], regarding some of her concerns about your health. Her heart is in the right place, she is worried about you and wants to do what is best.     Visit summary with medication list and pertinent instructions was printed for patient to review. All questions at time of visit were answered - patient instructed to contact office with any additional concerns. ER/RTC precautions were reviewed with the patient. Follow-up plan: Return in about 2 weeks (around 04/21/2017) for recheck blood pressure .  Note: Total time spent 40 minutes, greater than 50% of the visit was spent face-to-face counseling and coordinating care for the  following: The primary encounter diagnosis was Dementia without behavioral disturbance, unspecified dementia type. Diagnoses of Anxious depression, Memory loss, Essential hypertension, History of prediabetes, Urinary frequency, Mild intermittent asthma, unspecified whether complicated, and Adrenal insufficiency ([place]) were also pertinent to this visit.Marland Kitchen  Please note: voice recognition software was used to produce this document, and typos may escape review. Please contact Dr. Sheppard Coil for any needed clarifications.

## 2017-04-07 NOTE — Patient Instructions (Signed)
Plan:  As far as her medications are concerned, everything that is on the board I think it is fine to be taking.  The only thing I would add is be sure that she is on her blood pressure medicine. I think the amlodipine is probably the safest option for her, please be sure that this is restarted. Doesn't matter what time of day this is taken. Please be checking blood pressure at home, our goal is 140/90 or close to that.  I would have a frank discussion as a family with your daughter, Sierra [name], regarding some of her concerns about your health. Her heart is in the right place, she is worried about you and wants to do what is best.

## 2017-04-08 ENCOUNTER — Encounter: Payer: Self-pay | Admitting: Osteopathic Medicine

## 2017-04-13 ENCOUNTER — Telehealth: Payer: Self-pay | Admitting: Osteopathic Medicine

## 2017-04-13 DIAGNOSIS — F0390 Unspecified dementia without behavioral disturbance: Secondary | ICD-10-CM

## 2017-04-13 DIAGNOSIS — F419 Anxiety disorder, unspecified: Secondary | ICD-10-CM

## 2017-04-13 NOTE — Telephone Encounter (Signed)
Referral to [facility] psych

## 2017-04-14 ENCOUNTER — Encounter: Payer: Self-pay | Admitting: Osteopathic Medicine

## 2017-04-21 ENCOUNTER — Encounter: Payer: Self-pay | Admitting: Osteopathic Medicine

## 2017-04-21 ENCOUNTER — Ambulatory Visit: Payer: Medicare Other | Admitting: Osteopathic Medicine

## 2017-04-29 ENCOUNTER — Encounter: Payer: Self-pay | Admitting: Osteopathic Medicine

## 2017-05-19 ENCOUNTER — Encounter: Payer: Self-pay | Admitting: Osteopathic Medicine

## 2017-05-19 ENCOUNTER — Ambulatory Visit (INDEPENDENT_AMBULATORY_CARE_PROVIDER_SITE_OTHER): Payer: Medicare Other | Admitting: Osteopathic Medicine

## 2017-05-19 VITALS — BP 161/60 | HR 86 | Temp 98.2°F | Wt 136.0 lb

## 2017-05-19 DIAGNOSIS — S91002A Unspecified open wound, left ankle, initial encounter: Secondary | ICD-10-CM

## 2017-05-19 NOTE — Progress Notes (Signed)
HPI: Sierra [name] is a 84 y.o. female who  has a past medical history of Acute asthmatic bronchitis, Acute respiratory failure with hypoxia ([place]) (06/11/2016), Allergic rhinitis, Arthritis, Asthmatic bronchitis, ATYPICAL MYCOBACTERIAL INFECTION (05/05/2010), Balance problem, Blood transfusion, Cardiac murmur, Chronic kidney disease, Chronic pharyngitis, Colonic diverticular abscess (02/01/2017), Diverticulitis, colon, Dizziness, DOE (dyspnea on exertion), Esophageal reflux, GI bleed (01/06/2013), Hyperlipemia, Hypertension, Hypothyroidism, IBS (irritable bowel syndrome), Leukocytosis, LGI bleed (01/05/2013), Other specified iron deficiency anemias, PNEUMONIA (06/02/2010), Postop Hyponatremia (07/28/2011), Rectal bleeding (01/03/2014), Recurrent upper respiratory infection (URI) (07/10/2011), Thrombocytosis ([place]), THRUSH (04/30/2009), Ulceration, colon, and Ulcerative colitis.  she presents to [facility] today, 05/19/17,  for chief complaint of:  Chief Complaint  Patient presents with  . Wound Check    Laceration to right lower extremity sustained just after Christmas, few weeks ago. She reports that at that point there was a flap of skin, she never received emergency care or stitches for this. She has been getting what sounds like pretty intermittent woman care from her assisted living facility, she had her husband cannot recall the last time that the bandage was changed, she thinks maybe a few days ago? She has some itching where the bandage adhesive was stuck to the skin, no foul drainage, area is a bit tender but overall not really painful. She's had no fever, no joint pain.   Patient is accompanied by husband who assists with history-taking.   Past medical, surgical, social and family history reviewed: no updates needed  Current medication list and allergy/intolerance information reviewed: no updates needed     Review of Systems:  Constitutional:  No  fever,  no chills  HEENT: No  headache,  Cardiac: No  chest pain  Respiratory:  No  shortness of breath. No  Cough  Gastrointestinal: No  abdominal pain, No  nausea  Musculoskeletal: No new myalgia/arthralgia  Skin: No  Rash, +laceration/wound  Neurologic: No  weakness, No  dizziness  Exam:  BP (!) 161/60   Pulse 86   Temp 98.2 F (36.8 C) (Oral)   Wt 136 lb (61.7 kg)   BMI 23.71 kg/m   Constitutional: VS see above. General Appearance: alert, well-developed, well-nourished, NAD  Ears, Nose, Mouth, Throat: MMM, Normal external inspection ears/nares/mouth/lips/gums.   Neck: No masses, trachea midline.   Respiratory: Normal respiratory effort.   Musculoskeletal: Gait normal. No clubbing/cyanosis of digits.   Neurological: Normal balance/coordination. No tremor.   Skin: warm, dry. Right lower extremity proximal to ankle there is an area of healing superficial ulceration consistent with patient's description of laceration with skin flap injury, no flap at this point. Was minimally debrided by myself using sterile technique, underlying tissue is pink, appears to be healthy healing granulation tissue.  Psychiatric: Normal judgment/insight. Normal mood and affect. Oriented x3.     ASSESSMENT/PLAN:   Wound of left ankle, initial encounter - This looks like something that probably could have used stitches initially, at this point healing by secondary intention. Bandaged without adhesive. Recheck       Visit summary with medication list and pertinent instructions was printed for patient to review. All questions at time of visit were answered - patient instructed to contact office with any additional concerns. ER/RTC precautions were reviewed with the patient.   Follow-up plan: Return for wound recheck 1 week.  Note: Total time spent 25 minutes, greater than 50% of the visit was spent face-to-face counseling and coordinating care for the following: The encounter diagnosis was Wound  of left ankle, initial encounter..  Please note: voice recognition software was used to produce this document, and typos may escape review. Please contact Dr. Sheppard Coil for any needed clarifications.

## 2017-05-20 DIAGNOSIS — S91002A Unspecified open wound, left ankle, initial encounter: Secondary | ICD-10-CM | POA: Insufficient documentation

## 2017-05-21 ENCOUNTER — Encounter: Payer: Self-pay | Admitting: Osteopathic Medicine

## 2017-06-02 ENCOUNTER — Ambulatory Visit (INDEPENDENT_AMBULATORY_CARE_PROVIDER_SITE_OTHER): Payer: Medicare Other | Admitting: Osteopathic Medicine

## 2017-06-02 ENCOUNTER — Encounter: Payer: Self-pay | Admitting: Osteopathic Medicine

## 2017-06-02 VITALS — BP 165/90 | HR 81 | Temp 97.9°F | Wt 135.9 lb

## 2017-06-02 DIAGNOSIS — S91002D Unspecified open wound, left ankle, subsequent encounter: Secondary | ICD-10-CM

## 2017-06-02 DIAGNOSIS — I10 Essential (primary) hypertension: Secondary | ICD-10-CM | POA: Diagnosis not present

## 2017-06-02 NOTE — Progress Notes (Signed)
HPI: Sierra [name] is a 84 y.o. female who  has a past medical history of Acute asthmatic bronchitis, Acute respiratory failure with hypoxia ([place]) (06/11/2016), Allergic rhinitis, Arthritis, Asthmatic bronchitis, ATYPICAL MYCOBACTERIAL INFECTION (05/05/2010), Balance problem, Blood transfusion, Cardiac murmur, Chronic kidney disease, Chronic pharyngitis, Colonic diverticular abscess (02/01/2017), Diverticulitis, colon, Dizziness, DOE (dyspnea on exertion), Esophageal reflux, GI bleed (01/06/2013), Hyperlipemia, Hypertension, Hypothyroidism, IBS (irritable bowel syndrome), Leukocytosis, LGI bleed (01/05/2013), Other specified iron deficiency anemias, PNEUMONIA (06/02/2010), Postop Hyponatremia (07/28/2011), Rectal bleeding (01/03/2014), Recurrent upper respiratory infection (URI) (07/10/2011), Thrombocytosis ([place]), THRUSH (04/30/2009), Ulceration, colon, and Ulcerative colitis.  she presents to [facility], 06/02/17,  for chief complaint of:  Wound check  Overall doing better. She suffered a laceration to right lower extremity sustained just after Christmas, about a month ago at this point. She reports that at that point there was a flap of skin, she never received emergency care or stitches for this. We did some basic department and wound care education at last visit and the wound is looking a lot better. She's had no fever, no joint pain.   Patient is accompanied by husband who assists with history-taking.    Past medical, surgical, social and family history reviewed: no updates needed  Current medication list and allergy/intolerance information reviewed: no updates needed     Review of Systems:  Constitutional:  No  fever, no chills  HEENT: No  headache,  Cardiac: No  chest pain  Respiratory:  No  shortness of breath. No  Cough  Gastrointestinal: No  abdominal pain, No  nausea  Musculoskeletal: No new myalgia/arthralgia  Skin: No  Rash,  +laceration/wound  Neurologic: No  weakness, No  dizziness  Exam:  BP (!) 165/90 (BP Location: Left Arm)   Pulse 81   Temp 97.9 F (36.6 C) (Oral)   Wt 135 lb 14.4 oz (61.6 kg)   BMI 23.70 kg/m   Constitutional: VS see above. General Appearance: alert, well-developed, well-nourished, NAD  Neck: No masses, trachea midline.   Respiratory: Normal respiratory effort.   Musculoskeletal: Gait normal. No clubbing/cyanosis of digits.   Neurological: Normal balance/coordination. No tremor.   Skin: warm, dry. Right lower extremity proximal to ankle there is an area of healing superficial ulceration consistent with patient's description of laceration with skin flap injury, no flap at this point. Tissue is pink, appears to be healthy healing granulation tissue. Looking improved from last check.  Psychiatric: Normal judgment/insight. Normal mood and affect. Oriented x3.     ASSESSMENT/PLAN:   Wound of left ankle, subsequent encounter - Looking good. If slow/prolonged healing would consider referral to women clinic to evaluate need for hyperbaric oxygen  Essential hypertension - Patient reports stress today, we'll recheck at physical in a few weeks. Been resistant to adjustments in blood pressure medication       Visit summary with medication list and pertinent instructions was printed for patient to review. All questions at time of visit were answered - patient instructed to contact office with any additional concerns. ER/RTC precautions were reviewed with the patient.   Follow-up plan: Return for wellness check-up in a few weeks .    Please note: voice recognition software was used to produce this document, and typos may escape review. Please contact Dr. Sheppard Coil for any needed clarifications.

## 2017-06-16 ENCOUNTER — Encounter: Payer: Medicare Other | Admitting: Osteopathic Medicine

## 2017-06-16 DIAGNOSIS — Z0189 Encounter for other specified special examinations: Secondary | ICD-10-CM

## 2017-06-21 ENCOUNTER — Encounter: Payer: Self-pay | Admitting: Neurology

## 2017-06-21 ENCOUNTER — Ambulatory Visit: Payer: Medicare Other | Admitting: Neurology

## 2017-06-21 VITALS — BP 117/68 | HR 88 | Ht 63.5 in | Wt 135.0 lb

## 2017-06-21 DIAGNOSIS — F0390 Unspecified dementia without behavioral disturbance: Secondary | ICD-10-CM | POA: Diagnosis not present

## 2017-06-21 MED ORDER — DONEPEZIL HCL 10 MG PO TABS: 10.0000 mg | ORAL_TABLET | Freq: Every day | ORAL | 3 refills | Status: DC

## 2017-06-21 NOTE — Progress Notes (Signed)
Reason for visit: Memory disturbance  Sierra [name] is an 84 y.o. female  History of present illness:  Sierra [name] is an 84 year old right-handed white female with a history of a progressive memory disturbance.  The patient comes in today with her husband who claims that she is still getting worse with the memory.  She has been started on Aricept taking 5 mg at night, she is tolerating medication well.  She had a fall on 10 March 2017.  The patient was sitting on a bench and the bench collapsed on her and she fell backwards and hit her head.  She did have some nausea and vomiting for several weeks afterwards, but she feels well now.  The patient does have some baseline balance issues, she will be entering into physical therapy in the near future for this.  She lives at [facility].  The patient has not operated a motor vehicle in over 2 years.  The patient sometimes will forget when she took her medications, but she does have a pill dispenser that helps keep her on track with her medications.  She comes to this office for an evaluation.  Past Medical History:  Diagnosis Date  . Acute asthmatic bronchitis   . Acute respiratory failure with hypoxia ([place]) 06/11/2016  . Allergic rhinitis   . Arthritis    Hx R shoulder bursitis, pain L knee and L hip  . Asthmatic bronchitis   . ATYPICAL MYCOBACTERIAL INFECTION 05/05/2010   Sputum cx Pos 12/ 2011    . Balance problem    SINCE BRAIN TUMOR REMOVED IN 2002-BENIGN TUMOR-PT HAS ADRENAL INSUFFICIENCY AND TAKES DAILY PREDNISONE  . Blood transfusion   . Cardiac murmur    DOES NOT CAUSE ANY SYMPTOMS  . Chronic kidney disease   . Chronic pharyngitis   . Colonic diverticular abscess 02/01/2017  . Diverticulitis, colon   . Dizziness   . DOE (dyspnea on exertion)   . Esophageal reflux   . GI bleed 01/06/2013  . Hyperlipemia   . Hypertension   . Hypothyroidism   . IBS (irritable bowel syndrome)   . Leukocytosis   . LGI bleed 01/05/2013  .  Other specified iron deficiency anemias   . PNEUMONIA 06/02/2010   Qualifier: Diagnosis of  By: Annamaria Boots MD, Clinton D   . Postop Hyponatremia 07/28/2011  . Rectal bleeding 01/03/2014  . Recurrent upper respiratory infection (URI) 07/10/2011    ACUTE BRONCHITIS - extra prednisone in addition to the daily prednisone and a Z-Pak  . Thrombocytosis ([place])   . THRUSH 04/30/2009   Qualifier: Diagnosis of  By: Annamaria Boots MD, Clinton D   . Ulceration, colon   . Ulcerative colitis     Past Surgical History:  Procedure Laterality Date  . ABDOMINAL HYSTERECTOMY  1985  . APPENDECTOMY    . COLONOSCOPY N/A 01/05/2013   Procedure: COLONOSCOPY;  Surgeon: Cleotis Nipper, MD;  Location: [facility] ENDOSCOPY;  Service: Endoscopy;  Laterality: N/A;  . COLONOSCOPY N/A 01/04/2014   Procedure: COLONOSCOPY;  Surgeon: Winfield Cunas., MD;  Location: [facility] ENDOSCOPY;  Service: Endoscopy;  Laterality: N/A;  . CRANIOTOMY    . DILATION AND CURETTAGE OF UTERUS  1967  . ESOPHAGOGASTRODUODENOSCOPY N/A 01/05/2013   Procedure: ESOPHAGOGASTRODUODENOSCOPY (EGD);  Surgeon: Cleotis Nipper, MD;  Location: [facility] ENDOSCOPY;  Service: Endoscopy;  Laterality: N/A;  . ESOPHAGOGASTRODUODENOSCOPY N/A 06/26/2014   Procedure: ESOPHAGOGASTRODUODENOSCOPY (EGD);  Surgeon: Winfield Cunas., MD;  Location: [facility] ENDOSCOPY;  Service: Endoscopy;  Laterality: N/A;  . EYE SURGERY     2003 -RIGHT CATARACT EXTRACTED AND LEFT WAS DONE IN 2004  . FLEXIBLE SIGMOIDOSCOPY N/A 06/26/2014   Procedure: FLEXIBLE SIGMOIDOSCOPY;  Surgeon: Winfield Cunas., MD;  Location: [facility] ENDOSCOPY;  Service: Endoscopy;  Laterality: N/A;  . FRONTALIS SUSPENSION  10-09-2010   lifting eyelids AND SECOND EYE SURGERY November 19, 2010  . meningioma resected  20O2  . PARTIAL COLECTOMY  2008  . subglottal mucocoel  2000  . THYROIDECTOMY  1986  . TONSILLECTOMY  1938  . TOTAL HIP ARTHROPLASTY  OCT 2006   right  . TOTAL HIP ARTHROPLASTY  07/27/2011   Procedure: TOTAL HIP ARTHROPLASTY;   Surgeon: Gearlean Alf, MD;  Location: [facility] ORS;  Service: Orthopedics;  Laterality: Left;  Marland Kitchen VESICOVAGINAL FISTULA CLOSURE W/ TAH    . WRIST TENDON LESION REMOVED 2007      Family History  Problem Relation Age of Onset  . Heart attack Father        deceased    Social history:  reports that  has never smoked. she has never used smokeless tobacco. She reports that she drinks alcohol. She reports that she does not use drugs.    Allergies  Allergen Reactions  . Sulfamethoxazole Nausea And Vomiting  . Tape Other (See Comments)    TAPE TEARS AND BRUISES THE SKIN!!  . Biaxin [Clarithromycin] Other (See Comments)    Bitter taste  . Ciprofloxacin Rash  . Codeine Nausea And Vomiting  . Dilantin [Phenytoin] Rash  . Doxycycline Nausea Only    Very nauseated  . Lactose Intolerance (Gi) Other (See Comments)    gas  . Restasis [Cyclosporine] Other (See Comments) and Swelling    Eyes burn   . Sulfonamide Derivatives Rash    Medications:  Prior to Admission medications   Medication Sig Start Date End Date Taking? Authorizing Provider  albuterol (PROVENTIL HFA;VENTOLIN HFA) 108 (90 Base) MCG/ACT inhaler Inhale 2 puffs into the lungs every 6 (six) hours as needed for wheezing or shortness of breath. 06/12/16 10/02/20 Yes Young, Clinton D, MD  amLODipine (NORVASC) 2.5 MG tablet Take 2.5 mg by mouth daily.   Yes [provider]  Artificial Tear Solution (BION TEARS) 0.1-0.3 % SOLN Place 1 drop into both eyes every morning.    Yes [provider]  Artificial Tear Solution (GENTEAL TEARS) 0.1-0.2-0.3 % SOLN Place 1 drop into both eyes at bedtime.   Yes [provider]  Calcium Carbonate-Vitamin D3 (CALCIUM 600-D) 600-400 MG-UNIT TABS Take 1 tablet by mouth daily.   Yes [provider]  escitalopram (LEXAPRO) 5 MG tablet Take 1 tablet (5 mg total) by mouth daily. 04/07/17  Yes Emeterio Reeve, DO  fluticasone (FLOVENT HFA) 110 MCG/ACT inhaler Inhale 2 puffs 2  (two) times daily into the lungs.   Yes [provider]  levothyroxine (SYNTHROID, LEVOTHROID) 75 MCG tablet Take 1 tablet (75 mcg total) by mouth daily before breakfast. 02/02/17  Yes Emeterio Reeve, DO  mesalamine (LIALDA) 1.2 g EC tablet Take 2.4 g by mouth daily. 02/08/17  Yes [provider]  mirabegron ER (MYRBETRIQ) 25 MG TB24 tablet Take 1 tablet (25 mg total) by mouth daily. 04/07/17  Yes Emeterio Reeve, DO  Multiple Vitamins-Minerals (CENTRUM SILVER 50+WOMEN PO) Take 1 tablet daily by mouth.    Yes [provider]  Omega-3 Fatty Acids (FISH OIL) 1000 MG CAPS Take 1 capsule daily by mouth.    Yes [provider]  predniSONE (DELTASONE) 10 MG tablet Take 10 mg by mouth daily with breakfast.   Yes [provider]  Probiotic Product (PROBIOTIC DAILY PO) Take 1 capsule by mouth daily.    Yes [provider]  ranitidine (ZANTAC) 150 MG tablet Take 150 mg by mouth 2 (two) times daily.    Yes [provider]  donepezil (ARICEPT) 10 MG tablet Take 1 tablet (10 mg total) by mouth at bedtime. 06/21/17   Kathrynn Ducking, MD  Alum & Mag Hydroxide-Simeth (MAGIC MOUTHWASH) SOLN Take 5 mLs by mouth 3 (three) times daily as needed. Thrush   07/21/11  [provider]    ROS:  Out of a complete 14 system review of symptoms, the patient complains only of the following symptoms, and all other reviewed systems are negative.  Memory disturbance Balance problems  Blood pressure 117/68, pulse 88, height 5' 3.5" (1.613 m), weight 135 lb (61.2 kg).  Physical Exam  General: The patient is alert and cooperative at the time of the examination.  Skin: No significant peripheral edema is noted.   Neurologic Exam  Mental status: The patient is alert and oriented x 3 at the time of the examination. The Mini-Mental status examination done today shows a total score of 29/30.   Cranial nerves: Facial symmetry is present. Speech is  normal, no aphasia or dysarthria is noted. Extraocular movements are full. Visual fields are full.  Motor: The patient has good strength in all 4 extremities.  Sensory examination: Soft touch sensation is symmetric on the face, arms, and legs.  Coordination: The patient has good finger-nose-finger and heel-to-shin bilaterally.  Gait and station: The patient has a normal gait. Tandem gait is unsteady. Romberg is negative. No drift is seen.  Reflexes: Deep tendon reflexes are symmetric.   Assessment/Plan:  1.  Progressive memory disturbance  2.  Gait disturbance  The patient will enter into physical therapy for her balance.  We will go up on the Aricept dose to 10 mg at night.  The patient will follow-up in 6 months.  A prescription was sent in for the Aricept.  Jill Alexanders MD 06/21/2017 10:34 AM  [facility] [address] [place], [place] [address]  Phone [phone] Fax [phone]

## 2017-06-21 NOTE — Patient Instructions (Signed)
   We will go up on the Aricept dose to 10 mg at night.  Begin Aricept (donepezil) at 5 mg at night for one month. If this medication is well-tolerated, please call our office and we will call in a prescription for the 10 mg tablets. Look out for side effects that may include nausea, diarrhea, weight loss, or stomach cramps. This medication will also cause a runny nose, therefore there is no need for allergy medications for this purpose.

## 2017-06-29 ENCOUNTER — Encounter: Payer: Self-pay | Admitting: Osteopathic Medicine

## 2017-06-29 MED ORDER — PREDNISONE 10 MG PO TABS: 10.0000 mg | ORAL_TABLET | Freq: Every day | ORAL | 3 refills | Status: DC

## 2017-09-16 ENCOUNTER — Other Ambulatory Visit: Payer: Self-pay

## 2017-09-16 DIAGNOSIS — F418 Other specified anxiety disorders: Secondary | ICD-10-CM

## 2017-09-16 MED ORDER — ESCITALOPRAM OXALATE 5 MG PO TABS: 5.0000 mg | ORAL_TABLET | Freq: Every day | ORAL | 0 refills | Status: DC

## 2017-09-21 ENCOUNTER — Ambulatory Visit: Payer: Medicare Other | Admitting: Cardiovascular Disease

## 2017-10-05 ENCOUNTER — Ambulatory Visit: Payer: Medicare Other | Admitting: Cardiovascular Disease

## 2017-10-06 ENCOUNTER — Encounter: Payer: Self-pay | Admitting: Cardiovascular Disease

## 2017-10-08 ENCOUNTER — Ambulatory Visit: Payer: Medicare Other | Admitting: Cardiovascular Disease

## 2017-10-11 ENCOUNTER — Encounter: Payer: Self-pay | Admitting: Cardiovascular Disease

## 2017-10-23 ENCOUNTER — Emergency Department (HOSPITAL_COMMUNITY)
Admission: EM | Admit: 2017-10-23 | Discharge: 2017-10-23 | Disposition: A | Discharge status: Home / Self Care | Payer: Medicare Other | Attending: Emergency Medicine | Admitting: Emergency Medicine

## 2017-10-23 ENCOUNTER — Encounter (HOSPITAL_COMMUNITY): Payer: Self-pay | Admitting: *Deleted

## 2017-10-23 DIAGNOSIS — K529 Noninfective gastroenteritis and colitis, unspecified: Secondary | ICD-10-CM

## 2017-10-23 DIAGNOSIS — Z96643 Presence of artificial hip joint, bilateral: Secondary | ICD-10-CM | POA: Diagnosis not present

## 2017-10-23 DIAGNOSIS — E039 Hypothyroidism, unspecified: Secondary | ICD-10-CM | POA: Diagnosis not present

## 2017-10-23 DIAGNOSIS — N182 Chronic kidney disease, stage 2 (mild): Secondary | ICD-10-CM | POA: Diagnosis not present

## 2017-10-23 DIAGNOSIS — R197 Diarrhea, unspecified: Secondary | ICD-10-CM | POA: Diagnosis present

## 2017-10-23 DIAGNOSIS — I129 Hypertensive chronic kidney disease with stage 1 through stage 4 chronic kidney disease, or unspecified chronic kidney disease: Secondary | ICD-10-CM | POA: Diagnosis not present

## 2017-10-23 DIAGNOSIS — Z79899 Other long term (current) drug therapy: Secondary | ICD-10-CM | POA: Diagnosis not present

## 2017-10-23 LAB — CBC WITH DIFFERENTIAL/PLATELET
Basophils Absolute: 0 10*3/uL (ref 0.0–0.1)
Basophils Relative: 0 %
EOS PCT: 2 %
Eosinophils Absolute: 0.2 10*3/uL (ref 0.0–0.7)
HEMATOCRIT: 41.8 % (ref 36.0–46.0)
Hemoglobin: 13.2 g/dL (ref 12.0–15.0)
LYMPHS ABS: 1.7 10*3/uL (ref 0.7–4.0)
LYMPHS PCT: 13 %
MCH: 28 pg (ref 26.0–34.0)
MCHC: 31.6 g/dL (ref 30.0–36.0)
MCV: 88.6 fL (ref 78.0–100.0)
MONO ABS: 1.2 10*3/uL — AB (ref 0.1–1.0)
Monocytes Relative: 9 %
NEUTROS ABS: 9.8 10*3/uL — AB (ref 1.7–7.7)
Neutrophils Relative %: 76 %
PLATELETS: 336 10*3/uL (ref 150–400)
RBC: 4.72 MIL/uL (ref 3.87–5.11)
RDW: 15.8 % — ABNORMAL HIGH (ref 11.5–15.5)
WBC: 13 10*3/uL — AB (ref 4.0–10.5)

## 2017-10-23 LAB — BASIC METABOLIC PANEL
Anion gap: 12 (ref 5–15)
BUN: 20 mg/dL (ref 6–20)
CO2: 27 mmol/L (ref 22–32)
Calcium: 8.9 mg/dL (ref 8.9–10.3)
Chloride: 101 mmol/L (ref 101–111)
Creatinine, Ser: 0.89 mg/dL (ref 0.44–1.00)
GFR calc Af Amer: >60 mL/min (ref >60)
GFR, EST NON AFRICAN AMERICAN: 57 mL/min — AB (ref >60)
GLUCOSE: 139 mg/dL — AB (ref 65–99)
POTASSIUM: 3.7 mmol/L (ref 3.5–5.1)
Sodium: 140 mmol/L (ref 135–145)

## 2017-10-23 LAB — MAGNESIUM: Magnesium: 2.1 mg/dL (ref 1.7–2.4)

## 2017-10-23 MED ORDER — SODIUM CHLORIDE 0.9 % IV BOLUS
1000.0000 mL | Freq: Once | INTRAVENOUS | Status: AC
Start: 2017-10-23 — End: 2017-10-23
  Administered 2017-10-23: 1000 mL via INTRAVENOUS

## 2017-10-23 NOTE — Discharge Instructions (Signed)
Follow up with your doctor.  Return for worsening symptoms.  

## 2017-10-23 NOTE — ED Provider Notes (Signed)
S[facility] [name] DEPT Provider Note   CSN: [id number] Arrival date & time: 10/23/17  1208     History   Chief Complaint Chief Complaint  Patient presents with  . Diarrhea    HPI Sierra [name] is a 84 y.o. female.  84 yo F with a cc of diarrhea.  This been going on for the past 7 months.  The family thinks is due to a medication that she is taking.  Husband states that she is on multiple medications and that 1 of them must be the culprit.  He does not think that any specific medicine had caused it.  Denies abdominal pain denies vomiting denies fevers.  She has a gastroenterologist that she sees regularly and saw a couple months ago but they did mention that she was having diarrhea then.  She has also seen the PCP multiple times but again they have not mentioned diarrhea to her.  They felt that it is gotten worse over the past day or so.  Felt that her oral intake was a little bit less.  They are also very concerned that her dementia has not improved even on her medications.  The history is provided by the patient.  Diarrhea   This is a chronic problem. The current episode started more than 1 week ago. The problem occurs continuously. The problem has not changed since onset.The stool consistency is described as watery. Pertinent negatives include no vomiting, no chills, no headaches, no arthralgias and no myalgias. She has tried nothing for the symptoms. The treatment provided no relief.    Past Medical History:  Diagnosis Date  . Acute asthmatic bronchitis   . Acute respiratory failure with hypoxia ([place]) 06/11/2016  . Allergic rhinitis   . Arthritis    Hx R shoulder bursitis, pain L knee and L hip  . Asthmatic bronchitis   . ATYPICAL MYCOBACTERIAL INFECTION 05/05/2010   Sputum cx Pos 12/ 2011    . Balance problem    SINCE BRAIN TUMOR REMOVED IN 2002-BENIGN TUMOR-PT HAS ADRENAL INSUFFICIENCY AND TAKES DAILY PREDNISONE  . Blood transfusion   . Cardiac  murmur    DOES NOT CAUSE ANY SYMPTOMS  . Chronic kidney disease   . Chronic pharyngitis   . Colonic diverticular abscess 02/01/2017  . Diverticulitis, colon   . Dizziness   . DOE (dyspnea on exertion)   . Esophageal reflux   . GI bleed 01/06/2013  . Hyperlipemia   . Hypertension   . Hypothyroidism   . IBS (irritable bowel syndrome)   . Leukocytosis   . LGI bleed 01/05/2013  . Other specified iron deficiency anemias   . PNEUMONIA 06/02/2010   Qualifier: Diagnosis of  By: Annamaria Boots MD, Clinton D   . Postop Hyponatremia 07/28/2011  . Rectal bleeding 01/03/2014  . Recurrent upper respiratory infection (URI) 07/10/2011    ACUTE BRONCHITIS - extra prednisone in addition to the daily prednisone and a Z-Pak  . Thrombocytosis ([place])   . THRUSH 04/30/2009   Qualifier: Diagnosis of  By: Annamaria Boots MD, Clinton D   . Ulceration, colon   . Ulcerative colitis     Patient Active Problem List   Diagnosis Date Noted  . Wound of left ankle 05/20/2017  . Pancreatic mass 03/25/2017  . Post-concussion vertigo 03/10/2017  . Post concussive syndrome 03/06/2017  . Concussion with no loss of consciousness   . Fall   . Dementia without behavioral disturbance 03/03/2017  . Memory loss 12/17/2016  . Gait abnormality  12/17/2016  . CSF rhinorrhea 11/12/2016  . Edema extremities 07/13/2016  . Dyspnea on exertion 12/04/2015  . Anemia 12/12/2014  . Anxiety 12/12/2014  . Asthma 12/12/2014  . Chronic kidney disease, stage II (mild) 12/12/2014  . H/O: GI bleed 12/12/2014  . Postural lightheadedness 12/12/2014  . 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[place]) 12/12/2014  . Adrenal insufficiency ([place]) 01/03/2014  . Ulcerative colitis, chronic ([place]) 01/03/2014  . Meningioma ([place]) 06/08/2013  . Acute blood loss anemia 01/06/2013  . Malaise and fatigue 09/21/2011  . Benign hypertensive heart disease without congestive heart failure 09/21/2011  . Atrial premature contractions 09/21/2011  . Osteoarthritis of hip 07/27/2011  .  DYSPNEA ON EXERTION 05/30/2010  . Hypothyroidism 05/01/2010  . HOARSENESS, CHRONIC 11/05/2009  . Asthmatic bronchitis, mild persistent, uncomplicated 26/71/2458  . Hyperlipidemia 07/11/2007  . ANEMIA, IRON DEFICIENCY, MICROCYTIC 07/11/2007  . LEUKOCYTOSIS 07/11/2007  . THROMBOCYTOSIS 07/11/2007  . PHARYNGITIS, CHRONIC 07/11/2007  . Nonallergic vasomotor rhinitis 07/11/2007  . Esophageal reflux 07/11/2007  . DIVERTICULITIS, COLON 07/11/2007  . ARTHRITIS 07/11/2007  . CARDIAC MURMUR 07/11/2007  . Diverticulitis of colon 07/11/2007    Past Surgical History:  Procedure Laterality Date  . ABDOMINAL HYSTERECTOMY  1985  . APPENDECTOMY    . COLONOSCOPY N/A 01/05/2013   Procedure: COLONOSCOPY;  Surgeon: Cleotis Nipper, MD;  Location[facility]COPY;  Service: Endoscopy;  Laterality: N/A;  . COLONOSCOPY N/A 01/04/2014   Procedure: COLONOSCOPY;  Surgeon: Winfield Cunas., MD;  Location: [facility] ENDOSCOPY;  Service: Endoscopy;  Laterality: N/A;  . CRANIOTOMY    . DILATION AND CURETTAGE OF UTERUS  1967  . ESOPHAGOGASTRODUODENOSCOPY N/A 01/05/2013   Procedure: ESOPHAGOGASTRODUODENOSCOPY (EGD);  Surgeon: Cleotis Nipper, MD;  Location: [facility] ENDOSCOPY;  Service: Endoscopy;  Laterality: N/A;  . ESOPHAGOGASTRODUODENOSCOPY N/A 06/26/2014   Procedure: ESOPHAGOGASTRODUODENOSCOPY (EGD);  Surgeon: Winfield Cunas., MD;  Location: [facility] ENDOSCOPY;  Service: Endoscopy;  Laterality: N/A;  . EYE SURGERY     2003 -RIGHT CATARACT EXTRACTED AND LEFT WAS DONE IN 2004  . FLEXIBLE SIGMOIDOSCOPY N/A 06/26/2014   Procedure: FLEXIBLE SIGMOIDOSCOPY;  Surgeon: Winfield Cunas., MD;  Location: [facility] ENDOSCOPY;  Service: Endoscopy;  Laterality: N/A;  . FRONTALIS SUSPENSION  10-09-2010   lifting eyelids AND SECOND EYE SURGERY November 19, 2010  . meningioma resected  20O2  . PARTIAL COLECTOMY  2008  . subglottal mucocoel  2000  . THYROIDECTOMY  1986  . TONSILLECTOMY  1938  . TOTAL HIP ARTHROPLASTY  OCT 2006   right  . TOTAL  HIP ARTHROPLASTY  07/27/2011   Procedure: TOTAL HIP ARTHROPLASTY;  Surgeon: Gearlean Alf, MD;  Location: [facility] ORS;  Service: Orthopedics;  Laterality: Left;  Marland Kitchen VESICOVAGINAL FISTULA CLOSURE W/ TAH    . WRIST TENDON LESION REMOVED 2007       OB History   None      Home Medications    Prior to Admission medications   Medication Sig Start Date End Date Taking? Authorizing Provider  albuterol (PROVENTIL HFA;VENTOLIN HFA) 108 (90 Base) MCG/ACT inhaler Inhale 2 puffs into the lungs every 6 (six) hours as needed for wheezing or shortness of breath. 06/12/16 10/02/20  Baird Lyons D, MD  amLODipine (NORVASC) 2.5 MG tablet Take 2.5 mg by mouth daily.    [provider]  Artificial Tear Solution (BION TEARS) 0.1-0.3 % SOLN Place 1 drop into both eyes every morning.     [provider]  Artificial Tear Solution (GENTEAL TEARS) 0.1-0.2-0.3 % SOLN Place 1 drop  into both eyes at bedtime.    [provider]  Calcium Carbonate-Vitamin D3 (CALCIUM 600-D) 600-400 MG-UNIT TABS Take 1 tablet by mouth daily.    [provider]  donepezil (ARICEPT) 10 MG tablet Take 1 tablet (10 mg total) by mouth at bedtime. 06/21/17   Kathrynn Ducking, MD  escitalopram (LEXAPRO) 5 MG tablet Take 1 tablet (5 mg total) by mouth daily. Pt needs f/u appt with provider. 09/16/17   Emeterio Reeve, DO  fluticasone (FLOVENT HFA) 110 MCG/ACT inhaler Inhale 2 puffs 2 (two) times daily into the lungs.    [provider]  levothyroxine (SYNTHROID, LEVOTHROID) 75 MCG tablet Take 1 tablet (75 mcg total) by mouth daily before breakfast. 02/02/17   Emeterio Reeve, DO  mesalamine (LIALDA) 1.2 g EC tablet Take 2.4 g by mouth daily. 02/08/17   [provider]  mirabegron ER (MYRBETRIQ) 25 MG TB24 tablet Take 1 tablet (25 mg total) by mouth daily. 04/07/17   Emeterio Reeve, DO  Multiple Vitamins-Minerals (CENTRUM SILVER 50+WOMEN PO) Take 1 tablet daily by mouth.     [provider]  Omega-3 Fatty Acids (FISH OIL) 1000 MG CAPS Take 1 capsule daily by mouth.     [provider]  predniSONE (DELTASONE) 10 MG tablet Take 1 tablet (10 mg total) by mouth daily with breakfast. 06/29/17   Emeterio Reeve, DO  Probiotic Product (PROBIOTIC DAILY PO) Take 1 capsule by mouth daily.     [provider]  ranitidine (ZANTAC) 150 MG tablet Take 150 mg by mouth 2 (two) times daily.     [provider]  Alum & Mag Hydroxide-Simeth (MAGIC MOUTHWASH) SOLN Take 5 mLs by mouth 3 (three) times daily as needed. Thrush   07/21/11  [provider]    Family History Family History  Problem Relation Age of Onset  . Heart attack Father        deceased    Social History Social History   Tobacco Use  . Smoking status: Never Smoker  . Smokeless tobacco: Never Used  Substance Use Topics  . Alcohol use: Yes    Comment: occ glass of wine  . Drug use: No     Allergies   Sulfamethoxazole; Tape; Biaxin [clarithromycin]; Ciprofloxacin; Codeine; Dilantin [phenytoin]; Doxycycline; Lactose intolerance (gi); Restasis [cyclosporine]; and Sulfonamide derivatives   Review of Systems Review of Systems  Constitutional: Negative for chills and fever.  HENT: Negative for congestion and rhinorrhea.   Eyes: Negative for redness and visual disturbance.  Respiratory: Negative for shortness of breath and wheezing.   Cardiovascular: Negative for chest pain and palpitations.  Gastrointestinal: Positive for diarrhea. Negative for nausea and vomiting.  Genitourinary: Negative for dysuria and urgency.  Musculoskeletal: Negative for arthralgias and myalgias.  Skin: Negative for pallor and wound.  Neurological: Negative for dizziness and headaches.     Physical Exam Updated Vital Signs BP 119/76 (BP Location: Right Arm)   Pulse 90   Temp 98.1 F (36.7 C) (Oral)   Resp 18   SpO2 93%   Physical Exam  Constitutional: She is oriented to person,  place, and time. She appears well-developed and well-nourished. No distress.  HENT:  Head: Normocephalic and atraumatic.  Eyes: Pupils are equal, round, and reactive to light. EOM are normal.  Neck: Normal range of motion. Neck supple.  Cardiovascular: Normal rate and regular rhythm. Exam reveals no gallop and no friction rub.  No murmur heard. Pulmonary/Chest: Effort normal. She has no wheezes. She has  no rales.  Abdominal: Soft. She exhibits distension (tympanitic to percussion). She exhibits no mass. There is no tenderness. There is no guarding.  Musculoskeletal: She exhibits no edema or tenderness.  Neurological: She is alert and oriented to person, place, and time.  Skin: Skin is warm and dry. She is not diaphoretic.  Psychiatric: She has a normal mood and affect. Her behavior is normal.  Nursing note and vitals reviewed.    ED Treatments / Results  Labs (all labs ordered are listed, but only abnormal results are displayed) Labs Reviewed  CBC WITH DIFFERENTIAL/PLATELET - Abnormal; Notable for the following components:      Result Value   WBC 13.0 (*)    RDW 15.8 (*)    Neutro Abs 9.8 (*)    Monocytes Absolute 1.2 (*)    All other components within normal limits  BASIC METABOLIC PANEL - Abnormal; Notable for the following components:   Glucose, Bld 139 (*)    GFR calc non Af Amer 57 (*)    All other components within normal limits  GASTROINTESTINAL PANEL BY PCR, STOOL (REPLACES STOOL CULTURE)  C DIFFICILE QUICK SCREEN W PCR REFLEX  MAGNESIUM    EKG None  Radiology No results found.  Procedures Procedures (including critical care time)  Medications Ordered in ED Medications  sodium chloride 0.9 % bolus 1,000 mL (1,000 mLs Intravenous New Bag/Given 10/23/17 1354)     Initial Impression / Assessment and Plan / ED Course  I have reviewed the triage vital signs and the nursing notes.  Pertinent labs & imaging results that were available during my care of the  patient were reviewed by me and considered in my medical decision making (see chart for details).     84 yo F with a chief complaint of diarrhea.  Is been going on for the past 7 months.  Patient has no abdominal tenderness on exam.  She is mildly distended.  Will obtain a laboratory evaluation to evaluate for hypokalemia or hypomagnesemia.  If this is unremarkable I will have her follow-up with her gastroenterologist.  Lab work with chronic leukocytosis, creatinine at baseline, mag normal.  D/c home.   3:27 PM:  I have discussed the diagnosis/risks/treatment options with the patient and family and believe the pt to be eligible for discharge home to follow-up with GI, PCP. We also discussed returning to the ED immediately if new or worsening sx occur. We discussed the sx which are most concerning (e.g., sudden worsening pain, fever, inability to tolerate by mouth) that necessitate immediate return. Medications administered to the patient during their visit and any new prescriptions provided to the patient are listed below.  Medications given during this visit Medications  sodium chloride 0.9 % bolus 1,000 mL (1,000 mLs Intravenous New Bag/Given 10/23/17 1354)     The patient appears reasonably screen and/or stabilized for discharge and I doubt any other medical condition or other [facility] requiring further screening, evaluation, or treatment in the ED at this time prior to discharge.    Final Clinical Impressions(s) / ED Diagnoses   Final diagnoses:  Chronic diarrhea    ED Discharge Orders    None       Deno Etienne, DO 10/23/17 1527

## 2017-10-23 NOTE — ED Notes (Signed)
Patient verbalized understanding of discharge instructions, no questions. Patient out of ED via wheelchair in no distress.  

## 2017-10-23 NOTE — ED Triage Notes (Signed)
Pt complains of diarrhea. Pt denies abdominal pain or vomiting. Pt's husband states the pt has diarrhea since starting new medications 7 months ago.

## 2017-11-09 ENCOUNTER — Encounter: Payer: Self-pay | Admitting: Cardiovascular Disease

## 2017-11-09 ENCOUNTER — Ambulatory Visit: Payer: Medicare Other | Admitting: Cardiovascular Disease

## 2017-11-09 DIAGNOSIS — E782 Mixed hyperlipidemia: Secondary | ICD-10-CM

## 2017-11-09 NOTE — Patient Instructions (Signed)

## 2017-11-09 NOTE — Assessment & Plan Note (Signed)
History of hyperlipidemia total cholesterol measured on 02/01/2017 of 235,, triglyceride level of 391 HDL of 46.Marland Kitchen

## 2017-11-09 NOTE — Progress Notes (Signed)
11/09/2017 Sierra [name]   12-06-1928  [id number]  Primary Physician Emeterio Reeve, DO Primary Cardiologist: Lorretta Harp MD Lupe Carney, [place]  HPI:  Sierra [name] is a 84 y.o.  thin-appearing married Caucasian female mother of 2, grandmother of 36 grandchildren who is accompanied by her husband today was also is patient of mine. She was referred by Dr. Dr. [name] for cardiovascular evaluation because of new-onset dyspnea on exertion. I last saw her in the office 01/26/2017. She has a history of hypertension and hyperlipidemia both treated. Her father died of a myocardial infarction at age 75. She has reactive airways disease treated by Dr. Annamaria Boots. She had a benign meningioma surgically removed with forced [facility] approximately 15 years ago. They moved into a retirement community recently and she's noticed increasing dyspnea over the last 2 months. She denies chest pain. A 2-D echocardiogram performed 10/05/11 was normal. She had a 2-D echocardiogram and a Myoview stress test both of which were normal. Unfortunately, she tripped and fell several weeks ago and hit her face on the ground resulting in significant facial trauma although noninvasive evaluation was not concerning. Since I saw her 10 months ago she's remained stable. She does have some unsteadiness of gait with major complaints of continued dyspnea with a negative cardiac workup.     Current Meds  Medication Sig  . amLODipine (NORVASC) 2.5 MG tablet Take 2.5 mg by mouth daily.  . Artificial Tear Solution (BION TEARS) 0.1-0.3 % SOLN Place 1 drop into both eyes every morning.   . Artificial Tear Solution (GENTEAL TEARS) 0.1-0.2-0.3 % SOLN Place 1 drop into both eyes at bedtime.  . Calcium Carbonate-Vitamin D3 (CALCIUM 600-D) 600-400 MG-UNIT TABS Take 1 tablet by mouth daily.  Marland Kitchen donepezil (ARICEPT) 5 MG tablet Take 5 mg by mouth at bedtime.  Marland Kitchen escitalopram (LEXAPRO) 5 MG tablet Take 1 tablet (5 mg total)  by mouth daily. Pt needs f/u appt with provider.  . fluticasone (FLOVENT HFA) 110 MCG/ACT inhaler Inhale 2 puffs 2 (two) times daily into the lungs.  Marland Kitchen guaiFENesin (MUCINEX) 600 MG 12 hr tablet Take 600 mg by mouth 2 (two) times daily.  . mesalamine (LIALDA) 1.2 g EC tablet Take 1.2 g by mouth daily.   . mirabegron ER (MYRBETRIQ) 25 MG TB24 tablet Take 1 tablet (25 mg total) by mouth daily.  . Multiple Vitamins-Minerals (CENTRUM SILVER 50+WOMEN PO) Take 1 tablet daily by mouth.   . Omega-3 Fatty Acids (FISH OIL) 1000 MG CAPS Take 1 capsule daily by mouth.   . predniSONE (DELTASONE) 10 MG tablet Take 1 tablet (10 mg total) by mouth daily with breakfast.  . Probiotic Product (PROBIOTIC DAILY PO) Take 1 capsule by mouth daily.   . ranitidine (ZANTAC) 75 MG tablet Take 75 mg by mouth 2 (two) times daily.     Allergies  Allergen Reactions  . Sulfamethoxazole Nausea And Vomiting  . Tape Other (See Comments)    TAPE TEARS AND BRUISES THE SKIN!!  . Biaxin [Clarithromycin] Other (See Comments)    Bitter taste  . Ciprofloxacin Rash  . Codeine Nausea And Vomiting  . Dilantin [Phenytoin] Rash  . Doxycycline Nausea Only    Very nauseated  . Lactose Intolerance (Gi) Other (See Comments)    gas  . Restasis [Cyclosporine] Other (See Comments) and Swelling    Eyes burn   . Sulfonamide Derivatives Rash    Social History   Socioeconomic History  . Marital  status: Married    Spouse name: Collins  . Number of children: 2  . Years of education: Not on file  . Highest education level: Not on file  Occupational History  . Occupation: Retired    Fish farm manager: OTHER    Comment: Education officer, museum, [place]  . Financial resource strain: Not on file  . Food insecurity:    Worry: Not on file    Inability: Not on file  . Transportation needs:    Medical: Not on file    Non-medical: Not on file  Tobacco Use  . Smoking status: Never Smoker  . Smokeless tobacco: Never Used  Substance  and Sexual Activity  . Alcohol use: Yes    Comment: occ glass of wine  . Drug use: No  . Sexual activity: Never    Birth control/protection: Abstinence  Lifestyle  . Physical activity:    Days per week: Not on file    Minutes per session: Not on file  . Stress: Not on file  Relationships  . Social connections:    Talks on phone: Not on file    Gets together: Not on file    Attends religious service: Not on file    Active member of club or organization: Not on file    Attends meetings of clubs or organizations: Not on file    Relationship status: Not on file  . Intimate partner violence:    Fear of current or ex partner: Not on file    Emotionally abused: Not on file    Physically abused: Not on file    Forced sexual activity: Not on file  Other Topics Concern  . 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  Cardiovascular: negative for chest pain, dyspnea on exertion, edema, orthopnea, palpitations, paroxysmal nocturnal dyspnea or shortness of breath Dermatological: negative for rash Respiratory: negative for cough or wheezing Urologic: negative for hematuria Abdominal: negative for nausea, vomiting, diarrhea, bright red blood per rectum, melena, or hematemesis Neurologic: negative for visual changes, syncope, or dizziness All other systems reviewed and are otherwise negative except as noted above.    Blood pressure 112/62, pulse 89, height 5' 3.5" (1.613 m), weight 136 lb (61.7 kg).  General appearance: alert and no distress Neck: no adenopathy, no carotid bruit, no JVD, supple, symmetrical, trachea midline and thyroid not enlarged, symmetric, no tenderness/mass/nodules Lungs: clear to auscultation bilaterally Heart: regular rate and rhythm, S1, S2  normal, no murmur, click, rub or gallop Extremities: extremities normal, atraumatic, no cyanosis or edema Pulses: 2+ and symmetric Skin: Skin color, texture, turgor normal. No rashes or lesions Neurologic: Alert and oriented X 3, normal strength and tone. Normal symmetric reflexes. Normal coordination and gait  EKG not performed today  ASSESSMENT AND PLAN:   Hyperlipidemia History of hyperlipidemia total cholesterol measured on 02/01/2017 of 235,, triglyceride level of 391 HDL of 46.Lorretta Harp MD FACP,FACC,FAHA, [facility] 11/09/2017 10:58 AM

## 2017-12-21 ENCOUNTER — Ambulatory Visit: Payer: Self-pay | Admitting: Nurse Practitioner

## 2018-04-05 ENCOUNTER — Other Ambulatory Visit: Payer: Self-pay

## 2018-04-05 MED ORDER — MIRABEGRON ER 25 MG PO TB24: 25.0000 mg | ORAL_TABLET | Freq: Every day | ORAL | 0 refills | Status: DC

## 2018-04-07 ENCOUNTER — Emergency Department (HOSPITAL_COMMUNITY)
Admission: EM | Admit: 2018-04-07 | Discharge: 2018-04-08 | Disposition: A | Discharge status: Home / Self Care | Payer: Medicare Other | Attending: Emergency Medicine | Admitting: Emergency Medicine

## 2018-04-07 ENCOUNTER — Emergency Department (HOSPITAL_COMMUNITY): Payer: Medicare Other

## 2018-04-07 ENCOUNTER — Other Ambulatory Visit: Payer: Self-pay

## 2018-04-07 ENCOUNTER — Encounter (HOSPITAL_COMMUNITY): Payer: Self-pay

## 2018-04-07 DIAGNOSIS — F0390 Unspecified dementia without behavioral disturbance: Secondary | ICD-10-CM | POA: Insufficient documentation

## 2018-04-07 DIAGNOSIS — J209 Acute bronchitis, unspecified: Secondary | ICD-10-CM | POA: Insufficient documentation

## 2018-04-07 DIAGNOSIS — I129 Hypertensive chronic kidney disease with stage 1 through stage 4 chronic kidney disease, or unspecified chronic kidney disease: Secondary | ICD-10-CM | POA: Diagnosis not present

## 2018-04-07 DIAGNOSIS — R000 Tachycardia, unspecified: Secondary | ICD-10-CM | POA: Diagnosis not present

## 2018-04-07 DIAGNOSIS — R531 Weakness: Secondary | ICD-10-CM

## 2018-04-07 DIAGNOSIS — E039 Hypothyroidism, unspecified: Secondary | ICD-10-CM | POA: Diagnosis not present

## 2018-04-07 DIAGNOSIS — Z8679 Personal history of other diseases of the circulatory system: Secondary | ICD-10-CM | POA: Insufficient documentation

## 2018-04-07 DIAGNOSIS — N182 Chronic kidney disease, stage 2 (mild): Secondary | ICD-10-CM | POA: Diagnosis not present

## 2018-04-07 DIAGNOSIS — E860 Dehydration: Secondary | ICD-10-CM | POA: Insufficient documentation

## 2018-04-07 DIAGNOSIS — J45909 Unspecified asthma, uncomplicated: Secondary | ICD-10-CM | POA: Insufficient documentation

## 2018-04-07 DIAGNOSIS — Z79899 Other long term (current) drug therapy: Secondary | ICD-10-CM | POA: Insufficient documentation

## 2018-04-07 LAB — CBC WITH DIFFERENTIAL/PLATELET
Abs Immature Granulocytes: 0.2 10*3/uL — ABNORMAL HIGH (ref 0.00–0.07)
Basophils Absolute: 0.1 10*3/uL (ref 0.0–0.1)
Basophils Relative: 0 %
EOS ABS: 0.1 10*3/uL (ref 0.0–0.5)
Eosinophils Relative: 1 %
HEMATOCRIT: 38.9 % (ref 36.0–46.0)
Hemoglobin: 12.1 g/dL (ref 12.0–15.0)
Immature Granulocytes: 1 %
LYMPHS ABS: 1.1 10*3/uL (ref 0.7–4.0)
Lymphocytes Relative: 5 %
MCH: 27.2 pg (ref 26.0–34.0)
MCHC: 31.1 g/dL (ref 30.0–36.0)
MCV: 87.4 fL (ref 80.0–100.0)
Monocytes Absolute: 1.3 10*3/uL — ABNORMAL HIGH (ref 0.1–1.0)
Monocytes Relative: 6 %
Neutro Abs: 21 10*3/uL — ABNORMAL HIGH (ref 1.7–7.7)
Neutrophils Relative %: 87 %
Platelets: 299 10*3/uL (ref 150–400)
RBC: 4.45 MIL/uL (ref 3.87–5.11)
RDW: 14.7 % (ref 11.5–15.5)
WBC: 23.8 10*3/uL — ABNORMAL HIGH (ref 4.0–10.5)
nRBC: 0 % (ref 0.0–0.2)

## 2018-04-07 LAB — COMPREHENSIVE METABOLIC PANEL
ALK PHOS: 43 U/L (ref 38–126)
ALT: 15 U/L (ref 0–44)
AST: 20 U/L (ref 15–41)
Albumin: 3.6 g/dL (ref 3.5–5.0)
Anion gap: 10 (ref 5–15)
BUN: 17 mg/dL (ref 8–23)
CALCIUM: 9.4 mg/dL (ref 8.9–10.3)
CO2: 25 mmol/L (ref 22–32)
CREATININE: 0.95 mg/dL (ref 0.44–1.00)
Chloride: 100 mmol/L (ref 98–111)
GFR calc Af Amer: >60 mL/min (ref >60)
GFR calc non Af Amer: 54 mL/min — ABNORMAL LOW (ref >60)
Glucose, Bld: 187 mg/dL — ABNORMAL HIGH (ref 70–99)
Potassium: 3.9 mmol/L (ref 3.5–5.1)
Sodium: 135 mmol/L (ref 135–145)
Total Bilirubin: 0.8 mg/dL (ref 0.3–1.2)
Total Protein: 7.3 g/dL (ref 6.5–8.1)

## 2018-04-07 LAB — TROPONIN I: Troponin I: <0.03 ng/mL (ref <0.03)

## 2018-04-07 LAB — I-STAT CG4 LACTIC ACID, ED
Lactic Acid, Venous: 2.31 mmol/L (ref 0.5–1.9)
Lactic Acid, Venous: 1.49 mmol/L (ref 0.5–1.9)

## 2018-04-07 MED ORDER — CEFDINIR 300 MG PO CAPS: 300.0000 mg | ORAL_CAPSULE | Freq: Two times a day (BID) | ORAL | 0 refills | Status: DC

## 2018-04-07 MED ORDER — SODIUM CHLORIDE 0.9 % IV BOLUS
500.0000 mL | Freq: Once | INTRAVENOUS | Status: AC
  Administered 2018-04-07: 500 mL via INTRAVENOUS

## 2018-04-07 MED ORDER — SODIUM CHLORIDE 0.9 % IV SOLN
1.0000 g | Freq: Once | INTRAVENOUS | Status: AC
  Administered 2018-04-07: 1 g via INTRAVENOUS
  Filled 2018-04-07: qty 10

## 2018-04-07 MED ORDER — SODIUM CHLORIDE 0.9 % IV BOLUS
1000.0000 mL | Freq: Once | INTRAVENOUS | Status: AC
  Administered 2018-04-07: 1000 mL via INTRAVENOUS

## 2018-04-07 MED ORDER — ACETAMINOPHEN 500 MG PO TABS
1000.0000 mg | ORAL_TABLET | Freq: Once | ORAL | Status: AC
  Administered 2018-04-07: 1000 mg via ORAL
  Filled 2018-04-07: qty 2

## 2018-04-07 NOTE — ED Notes (Addendum)
Patient's husband informed RN that he is leaving to let his animals out to use restroom and will be back to be with patient. Patient's husband wanted to let someone know just in case wife is up for discharge.

## 2018-04-07 NOTE — ED Provider Notes (Signed)
[facility] DEPT Provider Note   CSN: [id number] Arrival date & time: 04/07/18  1537     History   Chief Complaint Chief Complaint  Patient presents with  . Weakness  . Cough    HPI Sierra [name] is a 84 y.o. female.  Pt presents to the ED today with weakness and cough.  The pt lives at home with her husband.  She normally walks ok, but could not walk today.  She feels too weak.  She has had a cough.  No known fever.  She denies any pain.     Past Medical History:  Diagnosis Date  . Acute asthmatic bronchitis   . Acute respiratory failure with hypoxia ([place]) 06/11/2016  . Allergic rhinitis   . Arthritis    Hx R shoulder bursitis, pain L knee and L hip  . Asthmatic bronchitis   . ATYPICAL MYCOBACTERIAL INFECTION 05/05/2010   Sputum cx Pos 12/ 2011    . Balance problem    SINCE BRAIN TUMOR REMOVED IN 2002-BENIGN TUMOR-PT HAS ADRENAL INSUFFICIENCY AND TAKES DAILY PREDNISONE  . Blood transfusion   . Cardiac murmur    DOES NOT CAUSE ANY SYMPTOMS  . Chronic kidney disease   . Chronic pharyngitis   . Colonic diverticular abscess 02/01/2017  . Diverticulitis, colon   . Dizziness   . DOE (dyspnea on exertion)   . Esophageal reflux   . GI bleed 01/06/2013  . Hyperlipemia   . Hypertension   . Hypothyroidism   . IBS (irritable bowel syndrome)   . Leukocytosis   . LGI bleed 01/05/2013  . Other specified iron deficiency anemias   . PNEUMONIA 06/02/2010   Qualifier: Diagnosis of  By: Annamaria Boots MD, [place] D   . Postop Hyponatremia 07/28/2011  . Rectal bleeding 01/03/2014  . Recurrent upper respiratory infection (URI) 07/10/2011    ACUTE BRONCHITIS - extra prednisone in addition to the daily prednisone and a Z-Pak  . Thrombocytosis ([place])   . THRUSH 04/30/2009   Qualifier: Diagnosis of  By: Annamaria Boots MD, [place] D   . Ulceration, colon   . Ulcerative colitis     Patient Active Problem List   Diagnosis Date Noted  . Wound of left ankle 05/20/2017    . Pancreatic mass 03/25/2017  . Post-concussion vertigo 03/10/2017  . Post concussive syndrome 03/06/2017  . Concussion with no loss of consciousness   . Fall   . Dementia without behavioral disturbance ([place]) 03/03/2017  . Memory loss 12/17/2016  . Gait abnormality 12/17/2016  . CSF rhinorrhea 11/12/2016  . Edema extremities 07/13/2016  . Dyspnea on exertion 12/04/2015  . Anemia 12/12/2014  . Anxiety 12/12/2014  . Asthma 12/12/2014  . Chronic kidney disease, stage II (mild) 12/12/2014  . H/O: GI bleed 12/12/2014  . Postural lightheadedness 12/12/2014  . 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[place]) 12/12/2014  . Adrenal insufficiency ([place]) 01/03/2014  . Ulcerative colitis, chronic ([place]) 01/03/2014  . Meningioma ([place]) 06/08/2013  . Acute blood loss anemia 01/06/2013  . Malaise and fatigue 09/21/2011  . Benign hypertensive heart disease without congestive heart failure 09/21/2011  . Atrial premature contractions 09/21/2011  . Osteoarthritis of hip 07/27/2011  . DYSPNEA ON EXERTION 05/30/2010  . Hypothyroidism 05/01/2010  . HOARSENESS, CHRONIC 11/05/2009  . Asthmatic bronchitis, mild persistent, uncomplicated 07/62/2633  . Hyperlipidemia 07/11/2007  . ANEMIA, IRON DEFICIENCY, MICROCYTIC 07/11/2007  . LEUKOCYTOSIS 07/11/2007  . THROMBOCYTOSIS 07/11/2007  . PHARYNGITIS, CHRONIC 07/11/2007  . Nonallergic vasomotor rhinitis 07/11/2007  . Esophageal reflux 07/11/2007  .  DIVERTICULITIS, COLON 07/11/2007  . ARTHRITIS 07/11/2007  . CARDIAC MURMUR 07/11/2007  . Diverticulitis of colon 07/11/2007    Past Surgical History:  Procedure Laterality Date  . ABDOMINAL HYSTERECTOMY  1985  . APPENDECTOMY    . COLONOSCOPY N/A 01/05/2013   Procedure: COLONOSCOPY;  Surgeon: Cleotis Nipper, MD;  Location: [facility] ENDOSCOPY;  Service: Endoscopy;  Laterality: N/A;  . COLONOSCOPY N/A 01/04/2014   Procedure: COLONOSCOPY;  Surgeon: Winfield Cunas., MD;  Location: [facility] ENDOSCOPY;  Service: Endoscopy;  Laterality:  N/A;  . CRANIOTOMY    . DILATION AND CURETTAGE OF UTERUS  1967  . ESOPHAGOGASTRODUODENOSCOPY N/A 01/05/2013   Procedure: ESOPHAGOGASTRODUODENOSCOPY (EGD);  Surgeon: Cleotis Nipper, MD;  Location: [facility] ENDOSCOPY;  Service: Endoscopy;  Laterality: N/A;  . ESOPHAGOGASTRODUODENOSCOPY N/A 06/26/2014   Procedure: ESOPHAGOGASTRODUODENOSCOPY (EGD);  Surgeon: Winfield Cunas., MD;  Location: [facility] ENDOSCOPY;  Service: Endoscopy;  Laterality: N/A;  . EYE SURGERY     2003 -RIGHT CATARACT EXTRACTED AND LEFT WAS DONE IN 2004  . FLEXIBLE SIGMOIDOSCOPY N/A 06/26/2014   Procedure: FLEXIBLE SIGMOIDOSCOPY;  Surgeon: Winfield Cunas., MD;  Location: [facility] ENDOSCOPY;  Service: Endoscopy;  Laterality: N/A;  . FRONTALIS SUSPENSION  10-09-2010   lifting eyelids AND SECOND EYE SURGERY November 19, 2010  . meningioma resected  20O2  . PARTIAL COLECTOMY  2008  . subglottal mucocoel  2000  . THYROIDECTOMY  1986  . TONSILLECTOMY  1938  . TOTAL HIP ARTHROPLASTY  OCT 2006   right  . TOTAL HIP ARTHROPLASTY  07/27/2011   Procedure: TOTAL HIP ARTHROPLASTY;  Surgeon: Gearlean Alf, MD;  Location: [facility] ORS;  Service: Orthopedics;  Laterality: Left;  Marland Kitchen VESICOVAGINAL FISTULA CLOSURE W/ TAH    . WRIST TENDON LESION REMOVED 2007       OB History   None      Home Medications    Prior to Admission medications   Medication Sig Start Date End Date Taking? Authorizing Provider  albuterol (PROVENTIL HFA) 108 (90 Base) MCG/ACT inhaler Inhale 2 puffs into the lungs daily as needed for shortness of breath.   Yes [provider]  amLODipine (NORVASC) 2.5 MG tablet Take 2.5 mg by mouth daily.   Yes [provider]  Artificial Tear Solution (BION TEARS) 0.1-0.3 % SOLN Place 1 drop into both eyes every morning.    Yes [provider]  Artificial Tear Solution (GENTEAL TEARS) 0.1-0.2-0.3 % SOLN Place 1 drop into both eyes at bedtime.   Yes [provider]  Calcium Carbonate-Vitamin D3 (CALCIUM 600-D)  600-400 MG-UNIT TABS Take 1 tablet by mouth daily.   Yes [provider]  donepezil (ARICEPT) 5 MG tablet Take 5 mg by mouth at bedtime.   Yes [provider]  escitalopram (LEXAPRO) 5 MG tablet Take 1 tablet (5 mg total) by mouth daily. Pt needs f/u appt with provider. 09/16/17  Yes Emeterio Reeve, DO  fluticasone (FLOVENT HFA) 110 MCG/ACT inhaler Inhale 2 puffs 2 (two) times daily into the lungs.   Yes [provider]  guaiFENesin (MUCINEX) 600 MG 12 hr tablet Take 600 mg by mouth 2 (two) times daily.   Yes [provider]  levothyroxine (SYNTHROID) 75 MCG tablet Take 75 mcg by mouth daily. 12/03/17  Yes [provider]  Multiple Vitamins-Minerals (CENTRUM SILVER 50+WOMEN PO) Take 1 tablet daily by mouth.    Yes [provider]  Omega-3 Fatty Acids (FISH OIL) 1000 MG CAPS Take 1,000 mg by mouth  daily.    Yes [provider]  predniSONE (DELTASONE) 10 MG tablet Take 1 tablet (10 mg total) by mouth daily with breakfast. 06/29/17  Yes Emeterio Reeve, DO  ranitidine (ZANTAC) 75 MG tablet Take 75 mg by mouth 2 (two) times daily.   Yes [provider]  cefdinir (OMNICEF) 300 MG capsule Take 1 capsule (300 mg total) by mouth 2 (two) times daily. 04/07/18   Isla Pence, MD  mirabegron ER (MYRBETRIQ) 25 MG TB24 tablet Take 1 tablet (25 mg total) by mouth daily. Pt needs a f/u appt w/Provider. No more refills. 04/05/18   Emeterio Reeve, DO  Alum & Mag Hydroxide-Simeth (MAGIC MOUTHWASH) SOLN Take 5 mLs by mouth 3 (three) times daily as needed. Thrush   07/21/11  [provider]    Family History Family History  Problem Relation Age of Onset  . Heart attack Father        deceased    Social History Social History   Tobacco Use  . Smoking status: Never Smoker  . Smokeless tobacco: Never Used  Substance Use Topics  . Alcohol use: Yes    Comment: occ glass of wine  . Drug use: No     Allergies     Sulfamethoxazole; Tape; Biaxin [clarithromycin]; Ciprofloxacin; Codeine; Dilantin [phenytoin]; Doxycycline; Lactose intolerance (gi); Restasis [cyclosporine]; and Sulfonamide derivatives   Review of Systems Review of Systems  Respiratory: Positive for cough.   Neurological: Positive for weakness.  All other systems reviewed and are negative.    Physical Exam Updated Vital Signs BP (!) 144/72 (BP Location: Right Arm)   Pulse 81   Temp 99.4 F (37.4 C) (Oral)   Resp (!) 22   SpO2 92%   Physical Exam  Constitutional: She is oriented to person, place, and time. She appears well-developed and well-nourished.  HENT:  Head: Normocephalic and atraumatic.  Right Ear: External ear normal.  Left Ear: External ear normal.  Nose: Nose normal.  Mouth/Throat: Oropharynx is clear and moist.  Eyes: Pupils are equal, round, and reactive to light. Conjunctivae and EOM are normal.  Neck: Normal range of motion. Neck supple.  Cardiovascular: Regular rhythm, normal heart sounds and intact distal pulses. Tachycardia present.  Pulmonary/Chest: Effort normal and breath sounds normal.  Abdominal: Soft. Bowel sounds are normal.  Musculoskeletal: Normal range of motion.  Neurological: She is alert and oriented to person, place, and time.  Skin: Skin is warm and dry. Capillary refill takes less than 2 seconds.  Psychiatric: She has a normal mood and affect. Her behavior is normal. Judgment and thought content normal.  Nursing note and vitals reviewed.    ED Treatments / Results  Labs (all labs ordered are listed, but only abnormal results are displayed) Labs Reviewed  CBC WITH DIFFERENTIAL/PLATELET - Abnormal; Notable for the following components:      Result Value   WBC 23.8 (*)    Neutro Abs 21.0 (*)    Monocytes Absolute 1.3 (*)    Abs Immature Granulocytes 0.20 (*)    All other components within normal limits  COMPREHENSIVE METABOLIC PANEL - Abnormal; Notable for the following  components:   Glucose, Bld 187 (*)    GFR calc non Af Amer 54 (*)    All other components within normal limits  I-STAT CG4 LACTIC ACID, ED - Abnormal; Notable for the following components:   Lactic Acid, Venous 2.31 (*)    All other components within normal limits  URINE CULTURE  CULTURE, BLOOD (ROUTINE  X 2)  CULTURE, BLOOD (ROUTINE X 2)  TROPONIN I  URINALYSIS, ROUTINE W REFLEX MICROSCOPIC  CBG MONITORING, ED  I-STAT CG4 LACTIC ACID, ED    EKG EKG Interpretation  Date/Time:  Thursday April 07 2018 16:12:18 EST Ventricular Rate:  116 PR Interval:    QRS Duration: 104 QT Interval:  344 QTC Calculation: 478 R Axis:   104 Text Interpretation:  Sinus tachycardia Right axis deviation Low voltage, precordial leads Since last tracing rate faster Confirmed by Isla Pence [phone]) on 04/07/2018 4:26:14 PM   Radiology Dg Chest 2 View  Result Date: 04/07/2018 CLINICAL DATA:  Cough and weakness today, history of asthma, bronchitis, heart murmur, pneumonia, hypertension, ulcerative colitis EXAM: CHEST - 2 VIEW COMPARISON:  07/23/2016 FINDINGS: Enlargement of cardiac silhouette. Calcification and elongation of thoracic aorta. Mediastinal contours and pulmonary vascularity otherwise normal. Lungs hyperinflated but clear. No acute infiltrate, pleural effusion, or pneumothorax. Osseous lucency at the surgical neck of the LEFT humerus, question healing surgical neck fracture, lytic lesion not excluded. IMPRESSION: Enlargement of cardiac silhouette. Hyperinflated lungs without acute infiltrate. Bony lucency at surgical neck LEFT humerus question healing fracture, lytic bone lesion not excluded; recommend correlation with patient history and in the absence of a known history of a healing fracture, recommend dedicated LEFT shoulder radiographs. Electronically Signed   By: Lavonia Dana M.D.   On: 04/07/2018 16:36    Procedures Procedures (including critical care time)  Medications Ordered in  ED Medications  sodium chloride 0.9 % bolus 1,000 mL (0 mLs Intravenous Stopped 04/07/18 1807)  acetaminophen (TYLENOL) tablet 1,000 mg (1,000 mg Oral Given 04/07/18 1639)  sodium chloride 0.9 % bolus 500 mL (500 mLs Intravenous New Bag/Given 04/07/18 1922)  cefTRIAXone (ROCEPHIN) 1 g in sodium chloride 0.9 % 100 mL IVPB (1 g Intravenous New Bag/Given 04/07/18 1922)     Initial Impression / Assessment and Plan / ED Course  I have reviewed the triage vital signs and the nursing notes.  Pertinent labs & imaging results that were available during my care of the patient were reviewed by me and considered in my medical decision making (see chart for details).   Pt able to ambulate without any problems.  Pt is feeling much better after IVFs.    She was given a dose of rocephin for bronchitis due to low grade fever and cough.    She will be d/c home on omnicef which will cover for a UTI as well.  Pt is tolerating po fluids.  She is stable for d/c.  Return if worse.  Final Clinical Impressions(s) / ED Diagnoses   Final diagnoses:  Acute bronchitis, unspecified organism  Dehydration  Weakness    ED Discharge Orders         Ordered    cefdinir (OMNICEF) 300 MG capsule  2 times daily     04/07/18 2346           Isla Pence, MD 04/07/18 2347

## 2018-04-07 NOTE — ED Notes (Signed)
MD AND RN NOTIFIED OF PATIENT'S LACTIC ACID LEVEL OF 2.31

## 2018-04-07 NOTE — ED Notes (Signed)
Bed: WA01 Expected date:  Expected time:  Means of arrival:  Comments: EMS/?sepsis?

## 2018-04-07 NOTE — ED Notes (Signed)
Patient aware we need urine. This Probation officer walked patient to restroom, stayed in restroom for about 5 minutes, gave patient privacy. Patient stayed in restroom for about 20 minutes attempting to get [place] a urine specimen. This Probation officer checked on patient twice to make sure everything was "okay." Patient was unable to provide specimen. RN notified

## 2018-04-07 NOTE — ED Triage Notes (Signed)
EMS reports from home, weakness, with productive cough, trouble standing.  BP  138/62 HR 98 SP02 97 on 2lts  20ga LAC

## 2018-04-07 NOTE — ED Notes (Signed)
Patient aware we need urine. 

## 2018-04-07 NOTE — ED Notes (Signed)
Pt attempted to void in urine collection receptacle and missed.

## 2018-04-08 LAB — URINALYSIS, ROUTINE W REFLEX MICROSCOPIC
BACTERIA UA: NONE SEEN
Bilirubin Urine: NEGATIVE
Glucose, UA: NEGATIVE mg/dL
Ketones, ur: NEGATIVE mg/dL
Leukocytes, UA: NEGATIVE
Nitrite: NEGATIVE
PH: 7 (ref 5.0–8.0)
Protein, ur: NEGATIVE mg/dL
Specific Gravity, Urine: 1.004 — ABNORMAL LOW (ref 1.005–1.030)

## 2018-04-09 LAB — URINE CULTURE: Culture: NO GROWTH

## 2018-04-12 LAB — CULTURE, BLOOD (ROUTINE X 2)
CULTURE: NO GROWTH
Special Requests: ADEQUATE

## 2018-04-13 LAB — CULTURE, BLOOD (ROUTINE X 2)
Culture: NO GROWTH
Special Requests: ADEQUATE

## 2018-05-10 ENCOUNTER — Telehealth: Payer: Self-pay

## 2018-05-10 MED ORDER — FLUTICASONE PROPIONATE HFA 110 MCG/ACT IN AERO: 2.0000 | INHALATION_SPRAY | Freq: Two times a day (BID) | RESPIRATORY_TRACT | 0 refills | Status: DC

## 2018-05-10 NOTE — Telephone Encounter (Signed)
Pt has been updated of med refill and covering provider's note. No other inquiries during call.

## 2018-05-10 NOTE — Telephone Encounter (Signed)
I sent one month. Not been seen in a while. Needs OV for further refills.

## 2018-05-10 NOTE — Telephone Encounter (Signed)
[facility] requesting med refill for flovent 110 mcg. Written by historical provider.

## 2018-08-24 ENCOUNTER — Encounter: Payer: Self-pay | Admitting: Cardiovascular Disease

## 2018-08-24 ENCOUNTER — Telehealth: Payer: Self-pay

## 2018-08-24 ENCOUNTER — Telehealth (INDEPENDENT_AMBULATORY_CARE_PROVIDER_SITE_OTHER): Payer: Medicare Other | Admitting: Cardiovascular Disease

## 2018-08-24 DIAGNOSIS — I119 Hypertensive heart disease without heart failure: Secondary | ICD-10-CM | POA: Diagnosis not present

## 2018-08-24 DIAGNOSIS — J4520 Mild intermittent asthma, uncomplicated: Secondary | ICD-10-CM

## 2018-08-24 DIAGNOSIS — E782 Mixed hyperlipidemia: Secondary | ICD-10-CM | POA: Diagnosis not present

## 2018-08-24 DIAGNOSIS — F0390 Unspecified dementia without behavioral disturbance: Secondary | ICD-10-CM | POA: Diagnosis not present

## 2018-08-24 NOTE — Progress Notes (Signed)
Virtual Visit via Telephone Note   This visit type was conducted due to national recommendations for restrictions regarding the COVID-19 Pandemic (e.g. social distancing) in an effort to limit this patient's exposure and mitigate transmission in our community.  Due to her co-morbid illnesses, this patient is at least at moderate risk for complications without adequate follow up.  This format is felt to be most appropriate for this patient at this time.  The patient did not have access to video technology/had technical difficulties with video requiring transitioning to audio format only (telephone).  All issues noted in this document were discussed and addressed.  No physical exam could be performed with this format.  Please refer to the patient's chart for her  consent to telehealth for [facility]   Evaluation Performed:  Follow-up visit  Date:  08/24/2018   ID:  Sierra [name], Sierra [name] February 16, 1929, MRN [id number]  Patient Location: [place] Provider Location: Home  PCP:  Patient, No Pcp Per  Cardiologist: Dr. Quay Burow Electrophysiologist:  None   Chief Complaint: Dyspnea on exertion  History of Present Illness:    Sierra GRUNDEN is a 85 y.o.  thin-appearing married Caucasian female mother of 2, grandmother of 71 grandchildren who is accompanied by her husband virtually today and who is also is patient of mine. She was referred by Dr. Dr. [name] for cardiovascular evaluation because of new-onset dyspnea on exertion. I last saw her in the office  11/09/2017. She has a history of hypertension and hyperlipidemia both treated. Her father died of a myocardial infarction at age 3. She has reactive airways disease treated by Dr. Annamaria Boots. She had a benign meningioma surgically removed with forced [facility] approximately 15 years ago. They moved into a retirement community recently and she's noticed increasing dyspnea over the last 2 months. She denies chest pain. A 2-D  echocardiogram performed 10/05/11 was normal. She had a 2-D echocardiogram and a Myoview stress test both of which were normal.  Unfortunately, she tripped and fell on her face while in an [employer] store over a year ago and has since that time lost her short-term memory.  According to her husband, she denies chest pain or shortness of breath.  She lives with him in a retirement community called [facility] where they have home health care several hours a days, several days a week.  Her last lipid profile performed 02/01/2017 revealed total cholesterol 235, LDL of 133 and HDL of 46.  The patient does not have symptoms concerning for COVID-19 infection (fever, chills, cough, or new shortness of breath).    Past Medical History:  Diagnosis Date  . Acute asthmatic bronchitis   . Acute respiratory failure with hypoxia ([place]) 06/11/2016  . Allergic rhinitis   . Arthritis    Hx R shoulder bursitis, pain L knee and L hip  . Asthmatic bronchitis   . ATYPICAL MYCOBACTERIAL INFECTION 05/05/2010   Sputum cx Pos 12/ 2011    . Balance problem    SINCE BRAIN TUMOR REMOVED IN 2002-BENIGN TUMOR-PT HAS ADRENAL INSUFFICIENCY AND TAKES DAILY PREDNISONE  . Blood transfusion   . Cardiac murmur    DOES NOT CAUSE ANY SYMPTOMS  . Chronic kidney disease   . Chronic pharyngitis   . Colonic diverticular abscess 02/01/2017  . Diverticulitis, colon   . Dizziness   . DOE (dyspnea on exertion)   . Esophageal reflux   . GI bleed 01/06/2013  . Hyperlipemia   . Hypertension   .  Hypothyroidism   . IBS (irritable bowel syndrome)   . Leukocytosis   . LGI bleed 01/05/2013  . Other specified iron deficiency anemias   . PNEUMONIA 06/02/2010   Qualifier: Diagnosis of  By: Annamaria Boots MD, Clinton D   . Postop Hyponatremia 07/28/2011  . Rectal bleeding 01/03/2014  . Recurrent upper respiratory infection (URI) 07/10/2011    ACUTE BRONCHITIS - extra prednisone in addition to the daily prednisone and a Z-Pak  . Thrombocytosis ([place])   .  THRUSH 04/30/2009   Qualifier: Diagnosis of  By: Annamaria Boots MD, Clinton D   . Ulceration, colon   . Ulcerative colitis    Past Surgical History:  Procedure Laterality Date  . ABDOMINAL HYSTERECTOMY  1985  . APPENDECTOMY    . COLONOSCOPY N/A 01/05/2013   Procedure: COLONOSCOPY;  Surgeon: Cleotis Nipper, MD;  Location: [facility] ENDOSCOPY;  Service: Endoscopy;  Laterality: N/A;  . COLONOSCOPY N/A 01/04/2014   Procedure: COLONOSCOPY;  Surgeon: Winfield Cunas., MD;  Location: [facility] ENDOSCOPY;  Service: Endoscopy;  Laterality: N/A;  . CRANIOTOMY    . DILATION AND CURETTAGE OF UTERUS  1967  . ESOPHAGOGASTRODUODENOSCOPY N/A 01/05/2013   Procedure: ESOPHAGOGASTRODUODENOSCOPY (EGD);  Surgeon: Cleotis Nipper, MD;  Location: [facility] ENDOSCOPY;  Service: Endoscopy;  Laterality: N/A;  . ESOPHAGOGASTRODUODENOSCOPY N/A 06/26/2014   Procedure: ESOPHAGOGASTRODUODENOSCOPY (EGD);  Surgeon: Winfield Cunas., MD;  Location: [facility] ENDOSCOPY;  Service: Endoscopy;  Laterality: N/A;  . EYE SURGERY     2003 -RIGHT CATARACT EXTRACTED AND LEFT WAS DONE IN 2004  . FLEXIBLE SIGMOIDOSCOPY N/A 06/26/2014   Procedure: FLEXIBLE SIGMOIDOSCOPY;  Surgeon: Winfield Cunas., MD;  Location: [facility] ENDOSCOPY;  Service: Endoscopy;  Laterality: N/A;  . FRONTALIS SUSPENSION  10-09-2010   lifting eyelids AND SECOND EYE SURGERY November 19, 2010  . meningioma resected  20O2  . PARTIAL COLECTOMY  2008  . subglottal mucocoel  2000  . THYROIDECTOMY  1986  . TONSILLECTOMY  1938  . TOTAL HIP ARTHROPLASTY  OCT 2006   right  . TOTAL HIP ARTHROPLASTY  07/27/2011   Procedure: TOTAL HIP ARTHROPLASTY;  Surgeon: Gearlean Alf, MD;  Location: [facility] ORS;  Service: Orthopedics;  Laterality: Left;  Marland Kitchen VESICOVAGINAL FISTULA CLOSURE W/ TAH    . WRIST TENDON LESION REMOVED 2007       Current Meds  Medication Sig  . albuterol (PROVENTIL HFA) 108 (90 Base) MCG/ACT inhaler Inhale 2 puffs into the lungs daily as needed for shortness of breath.  Marland Kitchen amLODipine (NORVASC)  2.5 MG tablet Take 2.5 mg by mouth daily.  . Artificial Tear Solution (BION TEARS) 0.1-0.3 % SOLN Place 1 drop into both eyes every morning.   . Artificial Tear Solution (GENTEAL TEARS) 0.1-0.2-0.3 % SOLN Place 1 drop into both eyes at bedtime.  . Calcium Carbonate-Vitamin D3 (CALCIUM 600-D) 600-400 MG-UNIT TABS Take 1 tablet by mouth daily.  . cefdinir (OMNICEF) 300 MG capsule Take 1 capsule (300 mg total) by mouth 2 (two) times daily.  . divalproex (DEPAKOTE) 125 MG DR tablet Take 125 mg by mouth 3 (three) times daily.  Marland Kitchen donepezil (ARICEPT) 5 MG tablet Take 5 mg by mouth at bedtime.  Marland Kitchen escitalopram (LEXAPRO) 5 MG tablet Take 1 tablet (5 mg total) by mouth daily. Pt needs f/u appt with provider.  . famotidine (PEPCID) 40 MG tablet Take 40 mg by mouth daily.  . fluticasone (FLOVENT HFA) 110 MCG/ACT inhaler Inhale 2 puffs into the lungs 2 (two) times daily.  Marland Kitchen  guaiFENesin (MUCINEX) 600 MG 12 hr tablet Take 600 mg by mouth 2 (two) times daily.  Marland Kitchen levothyroxine (SYNTHROID) 75 MCG tablet Take 75 mcg by mouth daily.  . mesalamine (LIALDA) 1.2 g EC tablet Take 1.2 g by mouth daily with breakfast.  . mirabegron ER (MYRBETRIQ) 25 MG TB24 tablet Take 1 tablet (25 mg total) by mouth daily. Pt needs a f/u appt w/Provider. No more refills.  . Multiple Vitamins-Minerals (CENTRUM SILVER 50+WOMEN PO) Take 1 tablet daily by mouth.   . Omega-3 Fatty Acids (FISH OIL) 1000 MG CAPS Take 1,000 mg by mouth daily.   . predniSONE (DELTASONE) 10 MG tablet Take 1 tablet (10 mg total) by mouth daily with breakfast.     Allergies:   Sulfamethoxazole; Tape; Biaxin [clarithromycin]; Ciprofloxacin; Codeine; Dilantin [phenytoin]; Doxycycline; Lactose intolerance (gi); Restasis [cyclosporine]; and Sulfonamide derivatives   Social History   Tobacco Use  . Smoking status: Never Smoker  . Smokeless tobacco: Never Used  Substance Use Topics  . Alcohol use: Yes    Comment: occ glass of wine  . Drug use: No     Family Hx:  The patient's family history includes Heart attack in her father.  ROS:   Please see the history of present illness.     All other systems reviewed and are negative.   Prior CV studies:   The following studies were reviewed today:  None  Labs/Other Tests and Data Reviewed:    EKG:  No ECG reviewed.  Recent Labs: 10/23/2017: Magnesium 2.1 04/07/2018: ALT 15; BUN 17; Creatinine, Ser 0.95; Hemoglobin 12.1; Platelets 299; Potassium 3.9; Sodium 135   Recent Lipid Panel Lab Results  Component Value Date/Time   CHOL 235 (H) 02/01/2017 11:28 AM   TRIG 391 (H) 02/01/2017 11:28 AM   HDL 46 (L) 02/01/2017 11:28 AM   CHOLHDL 5.1 (H) 02/01/2017 11:28 AM   LDLCALC 133 (H) 02/01/2017 11:28 AM    Wt Readings from Last 3 Encounters:  08/24/18 127 lb (57.6 kg)  11/09/17 136 lb (61.7 kg)  06/21/17 135 lb (61.2 kg)     Objective:    Vital Signs:  Ht 5' 3.5" (1.613 m)   Wt 127 lb (57.6 kg)   BMI 22.14 kg/m    A physical exam was not performed today since this was a telemedicine virtual telephone visit.  ASSESSMENT & PLAN:    1. Dyspnea on exertion- patient does have reactive airways disease followed by Dr. Annamaria Boots.  She had a cardiovascular work-up for dyspnea including a normal 2D echo and Myoview stress test.  Unfortunately, because of her loss of memory she really has no complaints lately including denying chest pain or shortness of breath. 2. Hyperlipidemia- history of hyperlipidemia not on statin therapy with lipid profile performed 02/01/2017 revealing total cholesterol 235, LDL of 133 and HDL 46.  COVID-19 Education: The signs and symptoms of COVID-19 were discussed with the patient and how to seek care for testing (follow up with PCP or arrange E-visit).  The importance of social distancing was discussed today.  Time:   Today, I have spent 7 minutes with the patient with telehealth technology discussing the above problems including time spent with chart review and  documentation.   Medication Adjustments/Labs and Tests Ordered: Current medicines are reviewed at length with the patient today.  Concerns regarding medicines are outlined above.   Tests Ordered: No orders of the defined types were placed in this encounter.   Medication Changes: No orders of the defined  types were placed in this encounter.   Disposition:  Follow up prn  Signed, Quay Burow, MD  08/24/2018 2:54 PM    [place]

## 2018-08-24 NOTE — Telephone Encounter (Signed)
Virtual Visit Pre-Appointment Phone Call  Steps For Call:  1. Confirm consent - "In the setting of the current Covid19 crisis, you are scheduled for a (phone or video) visit with your provider on (date) at (time).  Just as we do with many in-office visits, in order for you to participate in this visit, we must obtain consent.  If you'd like, I can send this to your mychart (if signed up) or email for you to review.  Otherwise, I can obtain your verbal consent now.  All virtual visits are billed to your insurance company just like a normal visit would be.  By agreeing to a virtual visit, we'd like you to understand that the technology does not allow for your provider to perform an examination, and thus may limit your provider's ability to fully assess your condition.  Finally, though the technology is pretty good, we cannot assure that it will always work on either your or our end, and in the setting of a video visit, we may have to convert it to a phone-only visit.  In either situation, we cannot ensure that we have a secure connection.  Are you willing to proceed?" STAFF: Did the patient verbally acknowledge consent to telehealth visit? Document YES/NO here: YES  2. Confirm the BEST phone number to call the day of the visit by including in appointment notes  3. Give patient instructions for WebEx/MyChart download to smartphone as below or Doximity/Doxy.me if video visit (depending on what platform provider is using)  4. Advise patient to be prepared with their blood pressure, heart rate, weight, any heart rhythm information, their current medicines, and a piece of paper and pen handy for any instructions they may receive the day of their visit  5. Inform patient they will receive a phone call 15 minutes prior to their appointment time (may be from unknown caller ID) so they should be prepared to answer  6. Confirm that appointment type is correct in Epic appointment notes (VIDEO vs PHONE)      TELEPHONE CALL NOTE  Sierra [name] has been deemed a candidate for a follow-up tele-health visit to limit community exposure during the Covid-19 pandemic. I spoke with the patient via phone to ensure availability of phone/video source, confirm preferred email & phone number, and discuss instructions and expectations.  I reminded Sierra [name] to be prepared with any vital sign and/or heart rhythm information that could potentially be obtained via home monitoring, at the time of her visit. I reminded Sierra [name] to expect a phone call at the time of her visit if her visit.  Sierra Panameno Dawna Part, RN 08/24/2018 1:44 PM   INSTRUCTIONS FOR DOWNLOADING THE [place] APP TO SMARTPHONE  - If Apple, ask patient to go to [employer] and type in WebEx in the search bar. [employer], the blue/green circle. If Android, go to [employer] and type in [employer] in the search bar. The app is free but as with any other app downloads, their phone may require them to verify saved payment information or Apple/Android password.  - The patient does NOT have to create an account. - On the day of the visit, the assist will walk the patient through joining the meeting with the meeting number/password.  INSTRUCTIONS FOR DOWNLOADING THE MYCHART APP TO SMARTPHONE  - The patient must first make sure to have activated MyChart and know their login information - If Apple, go to [employer] and type  in [place] in the search bar and download the app. If Android, ask patient to go to [employer] and type in [place] in the search bar and download the app. The app is free but as with any other app downloads, their phone may require them to verify saved payment information or Apple/Android password.  - The patient will need to then log into the app with their MyChart username and password, and select [facility] as their healthcare provider to link the account. When it is time for your visit, go to the MyChart  app, find appointments, and click Begin Video Visit. Be sure to Select Allow for your device to access the Microphone and Camera for your visit. You will then be connected, and your provider will be with you shortly.  **If they have any issues connecting, or need assistance please contact MyChart service desk [phone])**  **If using a computer, in order to ensure the best quality for their visit they will need to use either of the following Internet Browsers: [employer], or Google Chrome**  IF USING DOXIMITY or DOXY.ME - The patient will receive a link just prior to their visit, either by text or email (to be determined day of appointment depending on if it's doxy.me or Doximity).     FULL LENGTH CONSENT FOR TELE-HEALTH VISIT   I hereby voluntarily request, consent and authorize [facility] and its employed or contracted physicians, physician assistants, nurse practitioners or other licensed health care professionals (the Practitioner), to provide me with telemedicine health care services (the "Services") as deemed necessary by the treating Practitioner. I acknowledge and consent to receive the Services by the Practitioner via telemedicine. I understand that the telemedicine visit will involve communicating with the Practitioner through live audiovisual communication technology and the disclosure of certain medical information by electronic transmission. I acknowledge that I have been given the opportunity to request an in-person assessment or other available alternative prior to the telemedicine visit and am voluntarily participating in the telemedicine visit.  I understand that I have the right to withhold or withdraw my consent to the use of telemedicine in the course of my care at any time, without affecting my right to future care or treatment, and that the Practitioner or I may terminate the telemedicine visit at any time. I understand that I have the right to inspect all  information obtained and/or recorded in the course of the telemedicine visit and may receive copies of available information for a reasonable fee.  I understand that some of the potential risks of receiving the Services via telemedicine include:  Marland Kitchen Delay or interruption in medical evaluation due to technological equipment failure or disruption; . Information transmitted may not be sufficient (e.g. poor resolution of images) to allow for appropriate medical decision making by the Practitioner; and/or  . In rare instances, security protocols could fail, causing a breach of personal health information.  Furthermore, I acknowledge that it is my responsibility to provide information about my medical history, conditions and care that is complete and accurate to the best of my ability. I acknowledge that Practitioner's advice, recommendations, and/or decision may be based on factors not within their control, such as incomplete or inaccurate data provided by me or distortions of diagnostic images or specimens that may result from electronic transmissions. I understand that the practice of medicine is not an exact science and that Practitioner makes no warranties or guarantees regarding treatment outcomes. I acknowledge that I will receive a  copy of this consent concurrently upon execution via email to the email address I last provided but may also request a printed copy by calling the office of [place].    I understand that my insurance will be billed for this visit.   I have read or had this consent read to me. . I understand the contents of this consent, which adequately explains the benefits and risks of the Services being provided via telemedicine.  . I have been provided ample opportunity to ask questions regarding this consent and the Services and have had my questions answered to my satisfaction. . I give my informed consent for the services to be provided through the use of telemedicine in my  medical care  By participating in this telemedicine visit I agree to the above.

## 2018-08-24 NOTE — Patient Instructions (Signed)
Medication Instructions:  Your physician recommends that you continue on your current medications as directed. Please refer to the Current Medication list given to you today.  If you need a refill on your cardiac medications before your next appointment, please call your pharmacy.   Lab work: NONE If you have labs (blood work) drawn today and your tests are completely normal, you will receive your results only by: . MyChart Message (if you have MyChart) OR . A paper copy in the mail If you have any lab test that is abnormal or we need to change your treatment, we will call you to review the results.  Testing/Procedures: NONE  Follow-Up: At [facility], you and your health needs are our priority.  As part of our continuing mission to provide you with exceptional heart care, we have created designated Provider Care Teams.  These Care Teams include your primary Cardiologist (physician) and Advanced Practice Providers (APPs -  Physician Assistants and Nurse Practitioners) who all work together to provide you with the care you need, when you need it. . You will need a follow up appointment AS NEEDED. You may see Dr. Berry or one of the following Advanced Practice Providers on your designated Care Team:   . Luke Kilroy, PA-C . Hao Meng, PA-C . Angela Duke, PA-C . Kathryn Lawrence, DNP . Rhonda Barrett, PA-C . Krista Kroeger, PA-C . Callie Goodrich, PA-C    

## 2018-08-24 NOTE — Telephone Encounter (Signed)
Patient and/or DPR-approved person aware of AVS instructions and verbalized understanding. 

## 2018-09-28 IMAGING — CR DG CHEST 2V
2 series · 2 of 2 positions shown · non-contrast
Comparison: 07/22/2016

CLINICAL DATA: Head injury. Tripped over curb and fell on face.
Nasal deformity. Initial encounter.

EXAM:
CHEST  2 VIEW

[chest lat]
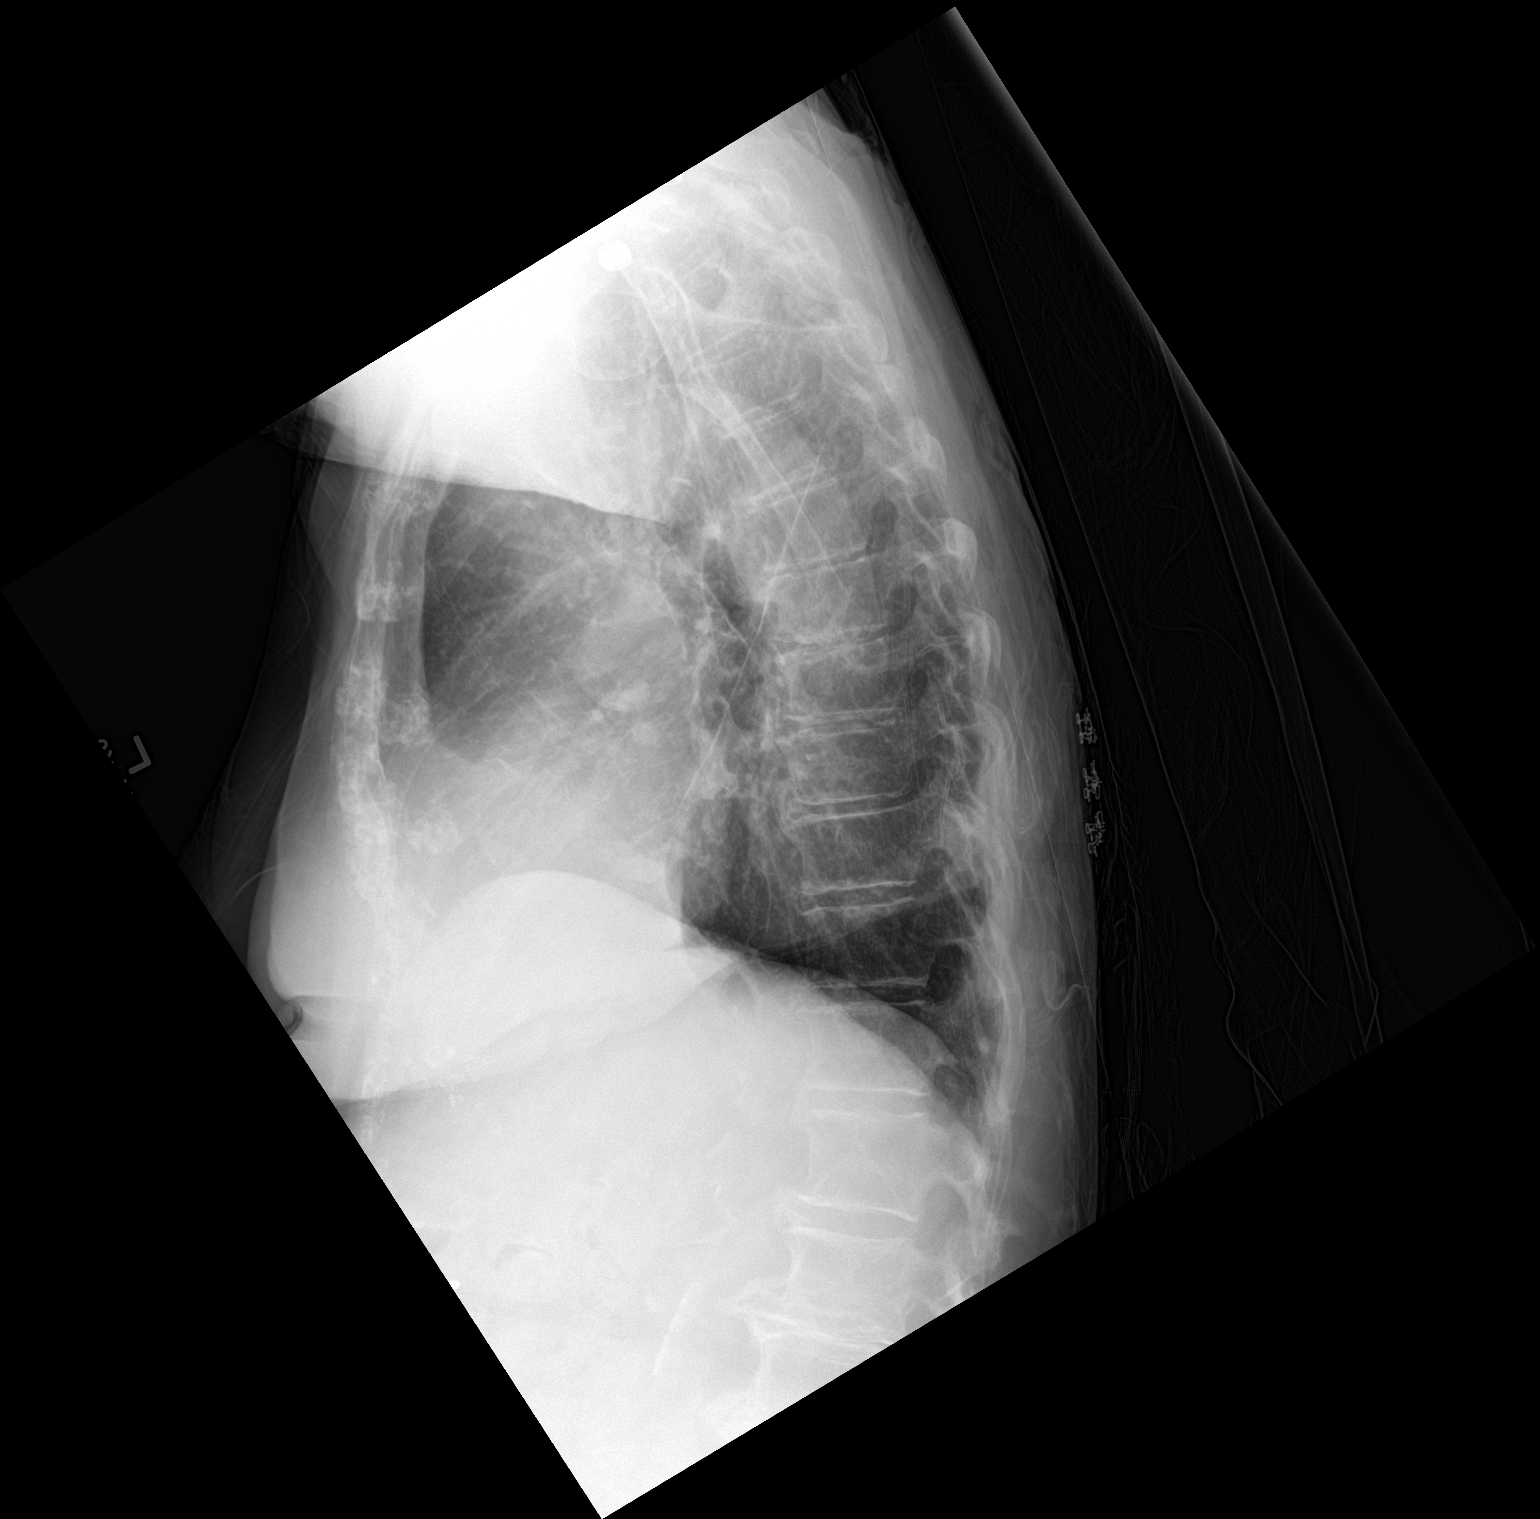

[chest ap]
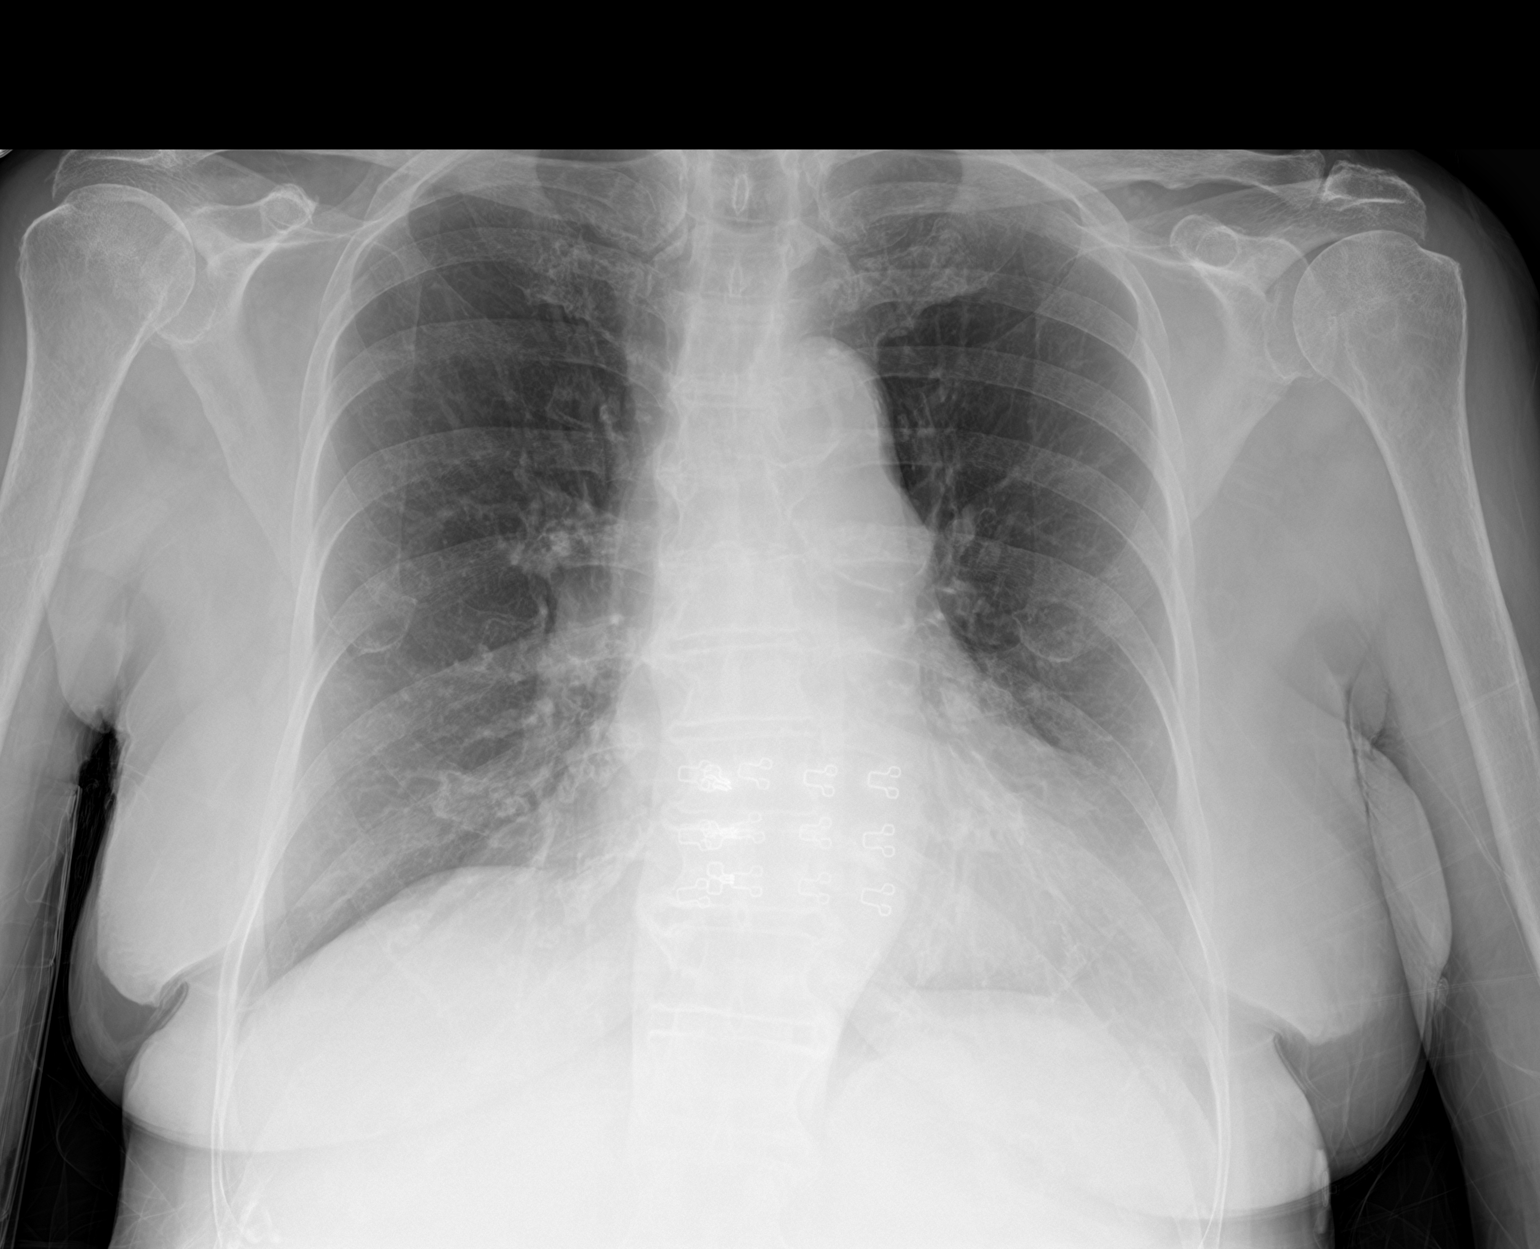

[2 of 2 positions shown; findings below may reference images not displayed]

FINDINGS: The cardiac silhouette is normal in size. Tortuosity and
atherosclerosis are again noted of the thoracic aorta. No airspace
consolidation, edema, pleural effusion, or pneumothorax is
identified. No acute osseous abnormality is identified.
IMPRESSION: No active cardiopulmonary disease.

## 2019-03-02 ENCOUNTER — Emergency Department (HOSPITAL_BASED_OUTPATIENT_CLINIC_OR_DEPARTMENT_OTHER): Payer: Medicare Other

## 2019-03-02 ENCOUNTER — Encounter (HOSPITAL_BASED_OUTPATIENT_CLINIC_OR_DEPARTMENT_OTHER): Payer: Self-pay | Admitting: *Deleted

## 2019-03-02 ENCOUNTER — Emergency Department (HOSPITAL_BASED_OUTPATIENT_CLINIC_OR_DEPARTMENT_OTHER)
Admission: EM | Admit: 2019-03-02 | Discharge: 2019-03-02 | Disposition: A | Discharge status: Home / Self Care | Payer: Medicare Other | Attending: Emergency Medicine | Admitting: Emergency Medicine

## 2019-03-02 ENCOUNTER — Other Ambulatory Visit: Payer: Self-pay

## 2019-03-02 DIAGNOSIS — Y939 Activity, unspecified: Secondary | ICD-10-CM | POA: Insufficient documentation

## 2019-03-02 DIAGNOSIS — Z23 Encounter for immunization: Secondary | ICD-10-CM | POA: Diagnosis not present

## 2019-03-02 DIAGNOSIS — S82001A Unspecified fracture of right patella, initial encounter for closed fracture: Secondary | ICD-10-CM | POA: Insufficient documentation

## 2019-03-02 DIAGNOSIS — S0003XA Contusion of scalp, initial encounter: Secondary | ICD-10-CM | POA: Diagnosis not present

## 2019-03-02 DIAGNOSIS — N182 Chronic kidney disease, stage 2 (mild): Secondary | ICD-10-CM | POA: Diagnosis not present

## 2019-03-02 DIAGNOSIS — Z79899 Other long term (current) drug therapy: Secondary | ICD-10-CM | POA: Insufficient documentation

## 2019-03-02 DIAGNOSIS — T07XXXA Unspecified multiple injuries, initial encounter: Secondary | ICD-10-CM | POA: Diagnosis not present

## 2019-03-02 DIAGNOSIS — S0990XA Unspecified injury of head, initial encounter: Secondary | ICD-10-CM | POA: Diagnosis present

## 2019-03-02 DIAGNOSIS — I509 Heart failure, unspecified: Secondary | ICD-10-CM | POA: Diagnosis not present

## 2019-03-02 DIAGNOSIS — Y929 Unspecified place or not applicable: Secondary | ICD-10-CM | POA: Diagnosis not present

## 2019-03-02 DIAGNOSIS — E039 Hypothyroidism, unspecified: Secondary | ICD-10-CM | POA: Insufficient documentation

## 2019-03-02 DIAGNOSIS — W19XXXA Unspecified fall, initial encounter: Secondary | ICD-10-CM | POA: Insufficient documentation

## 2019-03-02 DIAGNOSIS — I130 Hypertensive heart and chronic kidney disease with heart failure and stage 1 through stage 4 chronic kidney disease, or unspecified chronic kidney disease: Secondary | ICD-10-CM | POA: Insufficient documentation

## 2019-03-02 DIAGNOSIS — Y999 Unspecified external cause status: Secondary | ICD-10-CM | POA: Diagnosis not present

## 2019-03-02 MED ORDER — HYDROCODONE-ACETAMINOPHEN 5-325 MG PO TABS
1.0000 | ORAL_TABLET | Freq: Once | ORAL | Status: AC
  Administered 2019-03-02: 17:00:00 1 via ORAL
  Filled 2019-03-02: qty 1

## 2019-03-02 MED ORDER — TETANUS-DIPHTH-ACELL PERTUSSIS 5-2.5-18.5 LF-MCG/0.5 IM SUSP
0.5000 mL | Freq: Once | INTRAMUSCULAR | Status: AC
  Administered 2019-03-02: 16:00:00 0.5 mL via INTRAMUSCULAR
  Filled 2019-03-02: qty 0.5

## 2019-03-02 MED ORDER — TRAMADOL HCL 50 MG PO TABS: 50.0000 mg | ORAL_TABLET | Freq: Three times a day (TID) | ORAL | 0 refills | Status: DC | PRN

## 2019-03-02 MED ORDER — BACITRACIN ZINC 500 UNIT/GM EX OINT: TOPICAL_OINTMENT | Freq: Two times a day (BID) | CUTANEOUS | Status: DC

## 2019-03-02 NOTE — Discharge Instructions (Addendum)
Wear the knee immobilizer when you are on your feet.  He can take it off when you are sitting or laying down.  You may take Tylenol as suggested for pain.  You are being prescribed additional pain medicine for breakthrough symptoms.  Follow-up with orthopedic surgery.  Clean your wounds with mild soap and warm water.  Pat dry.

## 2019-03-02 NOTE — ED Provider Notes (Signed)
[facility] EMERGENCY DEPARTMENT Provider Note   CSN: [id number] Arrival date & time: 03/02/19  1445     History   Chief Complaint Chief Complaint  Patient presents with   Fall    HPI Sierra [name] is a 85 y.o. female.   HPI   85 year old female presenting after fall.  She thinks she lost her balance but she is not completely sure.  Obvious facial trauma.  She denies any similar pain aside from her right knee though.  She is not sure if she lost consciousness or not.  Unsure tetanus status.  No acute visual changes.  No nausea.  Husband at bedside.  He reports that she is at her baseline mental status.  Past Medical History:  Diagnosis Date   Acute asthmatic bronchitis    Acute respiratory failure with hypoxia (HCC) 06/11/2016   Allergic rhinitis    Arthritis    Hx R shoulder bursitis, pain L knee and L hip   Asthmatic bronchitis    ATYPICAL MYCOBACTERIAL INFECTION 05/05/2010   Sputum cx Pos 12/ 2011     Balance problem    SINCE BRAIN TUMOR REMOVED IN 2002-BENIGN TUMOR-PT HAS ADRENAL INSUFFICIENCY AND TAKES DAILY PREDNISONE   Blood transfusion    Cardiac murmur    DOES NOT CAUSE ANY SYMPTOMS   Chronic kidney disease    Chronic pharyngitis    Colonic diverticular abscess 02/01/2017   Diverticulitis, colon    Dizziness    DOE (dyspnea on exertion)    Esophageal reflux    GI bleed 01/06/2013   Hyperlipemia    Hypertension    Hypothyroidism    IBS (irritable bowel syndrome)    Leukocytosis    LGI bleed 01/05/2013   Other specified iron deficiency anemias    PNEUMONIA 06/02/2010   Qualifier: Diagnosis of  By: Annamaria Boots MD, Clinton D    Postop Hyponatremia 07/28/2011   Rectal bleeding 01/03/2014   Recurrent upper respiratory infection (URI) 07/10/2011    ACUTE BRONCHITIS - extra prednisone in addition to the daily prednisone and a Z-Pak   Thrombocytosis ([place])    THRUSH 04/30/2009   Qualifier: Diagnosis of  By: Annamaria Boots MD, Clinton D     Ulceration, colon    Ulcerative colitis    Patient Active Problem List   Diagnosis Date Noted   Wound of left ankle 05/20/2017   Pancreatic mass 03/25/2017   Post-concussion vertigo 03/10/2017   Post concussive syndrome 03/06/2017   Concussion with no loss of consciousness    Fall    Dementia without behavioral disturbance ([place]) 03/03/2017   Memory loss 12/17/2016   Gait abnormality 12/17/2016   CSF rhinorrhea 11/12/2016   Edema extremities 07/13/2016   Dyspnea on exertion 12/04/2015   Anemia 12/12/2014   Anxiety 12/12/2014   Asthma 12/12/2014   Chronic kidney disease, stage II (mild) 12/12/2014   H/O: GI bleed 12/12/2014   Postural lightheadedness 12/12/2014   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[place]) 12/12/2014   Adrenal insufficiency ([place]) 01/03/2014   Ulcerative colitis, chronic ([place]) 01/03/2014   Meningioma ([place]) 06/08/2013   Acute blood loss anemia 01/06/2013   Malaise and fatigue 09/21/2011   Benign hypertensive heart disease without congestive heart failure 09/21/2011   Atrial premature contractions 09/21/2011   Osteoarthritis of hip 07/27/2011   DYSPNEA ON EXERTION 05/30/2010   Hypothyroidism 05/01/2010   HOARSENESS, CHRONIC 11/05/2009   Asthmatic bronchitis, mild persistent, uncomplicated [id number]   Hyperlipidemia 07/11/2007   ANEMIA, IRON DEFICIENCY, MICROCYTIC 07/11/2007   LEUKOCYTOSIS 07/11/2007  THROMBOCYTOSIS 07/11/2007   PHARYNGITIS, CHRONIC 07/11/2007   Nonallergic vasomotor rhinitis 07/11/2007   Esophageal reflux 07/11/2007   DIVERTICULITIS, COLON 07/11/2007   ARTHRITIS 07/11/2007   CARDIAC MURMUR 07/11/2007   Diverticulitis of colon 07/11/2007   Past Surgical History:  Procedure Laterality Date   ABDOMINAL HYSTERECTOMY  1985   APPENDECTOMY     COLONOSCOPY N/A 01/05/2013   Procedure: COLONOSCOPY;  Surgeon: Cleotis Nipper, MD;  Location: [facility] ENDOSCOPY;  Service: Endoscopy;  Laterality: N/A;    COLONOSCOPY N/A 01/04/2014   Procedure: COLONOSCOPY;  Surgeon: Winfield Cunas., MD;  Location: [facility] ENDOSCOPY;  Service: Endoscopy;  Laterality: N/A;   CRANIOTOMY     DILATION AND CURETTAGE OF UTERUS  1967   ESOPHAGOGASTRODUODENOSCOPY N/A 01/05/2013   Procedure: ESOPHAGOGASTRODUODENOSCOPY (EGD);  Surgeon: Cleotis Nipper, MD;  Location: [facility] ENDOSCOPY;  Service: Endoscopy;  Laterality: N/A;   ESOPHAGOGASTRODUODENOSCOPY N/A 06/26/2014   Procedure: ESOPHAGOGASTRODUODENOSCOPY (EGD);  Surgeon: Winfield Cunas., MD;  Location: [facility] ENDOSCOPY;  Service: Endoscopy;  Laterality: N/A;   EYE SURGERY     2003 -RIGHT CATARACT EXTRACTED AND LEFT WAS DONE IN 2004   FLEXIBLE SIGMOIDOSCOPY N/A 06/26/2014   Procedure: FLEXIBLE SIGMOIDOSCOPY;  Surgeon: Winfield Cunas., MD;  Location: [facility] ENDOSCOPY;  Service: Endoscopy;  Laterality: N/A;   FRONTALIS SUSPENSION  10-09-2010   lifting eyelids AND SECOND EYE SURGERY November 19, 2010   meningioma resected  20O2   PARTIAL COLECTOMY  2008   subglottal mucocoel  2000   THYROIDECTOMY  1986   TONSILLECTOMY  1938   TOTAL HIP ARTHROPLASTY  OCT 2006   right   TOTAL HIP ARTHROPLASTY  07/27/2011   Procedure: TOTAL HIP ARTHROPLASTY;  Surgeon: Gearlean Alf, MD;  Location: [facility] ORS;  Service: Orthopedics;  Laterality: Left;   VESICOVAGINAL FISTULA CLOSURE W/ TAH     WRIST TENDON LESION REMOVED 2007       OB History   No obstetric history on file.    Home Medications    Prior to Admission medications   Medication Sig Start Date End Date Taking? Authorizing Provider  albuterol (PROVENTIL HFA) 108 (90 Base) MCG/ACT inhaler Inhale 2 puffs into the lungs daily as needed for shortness of breath.    [provider]  amLODipine (NORVASC) 2.5 MG tablet Take 2.5 mg by mouth daily.    [provider]  Artificial Tear Solution (BION TEARS) 0.1-0.3 % SOLN Place 1 drop into both eyes every morning.     [provider]  Artificial Tear  Solution (GENTEAL TEARS) 0.1-0.2-0.3 % SOLN Place 1 drop into both eyes at bedtime.    [provider]  Calcium Carbonate-Vitamin D3 (CALCIUM 600-D) 600-400 MG-UNIT TABS Take 1 tablet by mouth daily.    [provider]  cefdinir (OMNICEF) 300 MG capsule Take 1 capsule (300 mg total) by mouth 2 (two) times daily. 04/07/18   Isla Pence, MD  divalproex (DEPAKOTE) 125 MG DR tablet Take 125 mg by mouth 3 (three) times daily.    [provider]  donepezil (ARICEPT) 5 MG tablet Take 5 mg by mouth at bedtime.    [provider]  escitalopram (LEXAPRO) 5 MG tablet Take 1 tablet (5 mg total) by mouth daily. Pt needs f/u appt with provider. 09/16/17   Emeterio Reeve, DO  famotidine (PEPCID) 40 MG tablet Take 40 mg by mouth daily.    [provider]  fluticasone (FLOVENT HFA) 110 MCG/ACT inhaler Inhale 2 puffs into the lungs  2 (two) times daily. 05/10/18   Breeback, Royetta Car, PA-C  guaiFENesin (MUCINEX) 600 MG 12 hr tablet Take 600 mg by mouth 2 (two) times daily.    [provider]  levothyroxine (SYNTHROID) 75 MCG tablet Take 75 mcg by mouth daily. 12/03/17   [provider]  mesalamine (LIALDA) 1.2 g EC tablet Take 1.2 g by mouth daily with breakfast.    [provider]  mirabegron ER (MYRBETRIQ) 25 MG TB24 tablet Take 1 tablet (25 mg total) by mouth daily. Pt needs a f/u appt w/Provider. No more refills. 04/05/18   Emeterio Reeve, DO  Multiple Vitamins-Minerals (CENTRUM SILVER 50+WOMEN PO) Take 1 tablet daily by mouth.     [provider]  Omega-3 Fatty Acids (FISH OIL) 1000 MG CAPS Take 1,000 mg by mouth daily.     [provider]  predniSONE (DELTASONE) 10 MG tablet Take 1 tablet (10 mg total) by mouth daily with breakfast. 06/29/17   Emeterio Reeve, DO  Alum & Mag Hydroxide-Simeth (MAGIC MOUTHWASH) SOLN Take 5 mLs by mouth 3 (three) times daily as needed. Thrush   07/21/11  [provider]    Family History Family History  Problem Relation Age of Onset   Heart attack Father        deceased   Social History Social History   Tobacco Use   Smoking status: Never Smoker   Smokeless tobacco: Never Used  Substance Use Topics   Alcohol use: Yes    Comment: occ glass of wine   Drug use: No   Allergies   Sulfamethoxazole, Tape, Biaxin [clarithromycin], Ciprofloxacin, Codeine, Dilantin [phenytoin], Doxycycline, Lactose intolerance (gi), Restasis [cyclosporine], and Sulfonamide derivatives  Review of Systems Review of Systems  All systems reviewed and negative, other than as noted in HPI.  Physical Exam Updated Vital Signs BP (!) 156/63 (BP Location: Right Arm)    Pulse 81    Temp 98.3 F (36.8 C) (Oral)    Resp 18    Ht 5\' 3"  (1.6 m)    Wt 57.6 kg    SpO2 93%    BMI 22.50 kg/m   Physical Exam Vitals signs and nursing note reviewed.  Constitutional:      General: She is not in acute distress.    Appearance: She is well-developed.  HENT:     Head: Normocephalic.     Comments: Hematoma left eyebrow.  Small abrasion to the left temporoparietal region.  No active bleeding.  No midline spinal tenderness.  No oral pharyngeal trauma noted. Eyes:     General:        Right eye: No discharge.        Left eye: No discharge.     Conjunctiva/sclera: Conjunctivae normal.  Neck:     Musculoskeletal: Neck supple.  Cardiovascular:     Rate and Rhythm: Normal rate and regular rhythm.     Heart sounds: Normal heart sounds. No murmur. No friction rub. No gallop.   Pulmonary:     Effort: Pulmonary effort is normal. No respiratory distress.     Breath sounds: Normal breath sounds.  Abdominal:     General: There is no distension.     Palpations: Abdomen is soft.     Tenderness: There is no abdominal tenderness.  Musculoskeletal:     Comments: Small amount of ecchymosis to left anterior shoulder.  No pain with passive or active range of motion.  Feels like a lipoma in the  area of the proximal biceps.  Pretty impressive hematoma right over the right patella.  She can actively range both knees.  She does report some pain with ranging her right knee.  The left not so much.  No ligamentous laxity appreciated.  No midline spinal tenderness.  Skin:    General: Skin is warm and dry.  Neurological:     Mental Status: She is alert.  Psychiatric:        Behavior: Behavior normal.        Thought Content: Thought content normal.    ED Treatments / Results  Labs (all labs ordered are listed, but only abnormal results are displayed) Labs Reviewed - No data to display  EKG EKG Interpretation  Date/Time:  Thursday March 02 2019 15:26:47 EDT Ventricular Rate:  82 PR Interval:    QRS Duration: 110 QT Interval:  396 QTC Calculation: 463 R Axis:   85 Text Interpretation:  Sinus rhythm Borderline right axis deviation Low voltage, precordial leads Confirmed by Virgel Manifold [phone]) on 03/02/2019 3:49:17 PM   Radiology Ct Head Wo Contrast  Result Date: 03/02/2019 CLINICAL DATA:  Posttraumatic headache after fall today. EXAM: CT HEAD WITHOUT CONTRAST CT CERVICAL SPINE WITHOUT CONTRAST TECHNIQUE: Multidetector CT imaging of the head and cervical spine was performed following the standard protocol without intravenous contrast. Multiplanar CT image reconstructions of the cervical spine were also generated. COMPARISON:  March 10, 2017. FINDINGS: CT HEAD FINDINGS Brain: Mild diffuse cortical atrophy is noted. Mild chronic ischemic white matter disease is noted. Left posterior parietal encephalomalacia is noted which most likely is postoperative in etiology. No mass effect or midline shift is noted. Ventricular size is within normal limits. There is no evidence of mass lesion, hemorrhage or acute infarction. Vascular: No hyperdense vessel or unexpected calcification. Skull: Status post left posterior parietal craniotomy. No acute osseous abnormality is noted. Sinuses/Orbits:  No acute finding. Other: Large left frontal scalp hematoma is noted. CT CERVICAL SPINE FINDINGS Alignment: Normal. Skull base and vertebrae: No acute fracture. No primary bone lesion or focal pathologic process. Soft tissues and spinal canal: No prevertebral fluid or swelling. No visible canal hematoma. Disc levels: Severe degenerative disc disease is noted at C3-4, C4-5, C5-6 and C6-7 with anterior posterior osteophyte formation. Upper chest: Negative. Other: Degenerative changes are seen involving posterior facet joints bilaterally. IMPRESSION: Large left frontal scalp hematoma. Probable postoperative encephalomalacia is seen in the left posterior parietal cortex. No acute intracranial abnormality seen. Severe multilevel degenerative disc disease. No acute abnormality seen in the cervical spine. Electronically Signed   By: Marijo Conception M.D.   On: 03/02/2019 16:02   Ct Cervical Spine Wo Contrast  Result Date: 03/02/2019 CLINICAL DATA:  Posttraumatic headache after fall today. EXAM: CT HEAD WITHOUT CONTRAST CT CERVICAL SPINE WITHOUT CONTRAST TECHNIQUE: Multidetector CT imaging of the head and cervical spine was performed following the standard protocol without intravenous contrast. Multiplanar CT image reconstructions of the cervical spine were also generated. COMPARISON:  March 10, 2017. FINDINGS: CT HEAD FINDINGS Brain: Mild diffuse cortical atrophy is noted. Mild chronic ischemic white matter disease is noted. Left posterior parietal encephalomalacia is noted which most likely is postoperative in etiology. No mass effect or midline shift is noted. Ventricular size is within normal limits. There is no evidence of mass lesion, hemorrhage or acute infarction. Vascular: No hyperdense vessel or unexpected calcification. Skull: Status post left posterior parietal craniotomy. No acute osseous abnormality is noted. Sinuses/Orbits: No acute finding. Other: Large left frontal scalp hematoma is noted. CT CERVICAL  SPINE FINDINGS Alignment: Normal. Skull base and vertebrae: No acute fracture. No primary bone lesion or focal pathologic process. Soft tissues and spinal canal: No prevertebral fluid or swelling. No visible canal hematoma. Disc levels: Severe degenerative disc disease is noted at C3-4, C4-5, C5-6 and C6-7 with anterior posterior osteophyte formation. Upper chest: Negative. Other: Degenerative changes are seen involving posterior facet joints bilaterally. IMPRESSION: Large left frontal scalp hematoma. Probable postoperative encephalomalacia is seen in the left posterior parietal cortex. No acute intracranial abnormality seen. Severe multilevel degenerative disc disease. No acute abnormality seen in the cervical spine. Electronically Signed   By: Marijo Conception M.D.   On: 03/02/2019 16:02   Dg Knee Complete 4 Views Left  Result Date: 03/02/2019 CLINICAL DATA:  Recent fall with knee pain, initial encounter EXAM: LEFT KNEE - COMPLETE 4+ VIEW COMPARISON:  None. FINDINGS: Tricompartmental degenerative changes are noted. No joint effusion is seen. No acute fracture or dislocation is noted. Meniscal calcifications are seen. Prior osteochondral defect is noted in the medial femoral condyle IMPRESSION: Chronic changes without acute abnormality. Electronically Signed   By: Inez Catalina M.D.   On: 03/02/2019 16:01   Dg Knee Complete 4 Views Right  Result Date: 03/02/2019 CLINICAL DATA:  Recent fall with right knee pain, initial encounter EXAM: RIGHT KNEE - COMPLETE 4+ VIEW COMPARISON:  None. FINDINGS: Considerable soft tissue swelling is noted over the patella consistent with the recent injury. There is a lucency noted in the patella consistent with a vertical fracture. No other fractures are seen. No other focal abnormality is noted. IMPRESSION: Vertical patellar fracture with considerable overlying soft tissue swelling. Electronically Signed   By: Inez Catalina M.D.   On: 03/02/2019 16:02     Procedures Procedures (including critical care time)  Medications Ordered in ED Medications - No data to display   Initial Impression / Assessment and Plan / ED Course  I have reviewed the triage vital signs and the nursing notes.  Pertinent labs & imaging results that were available during my care of the patient were reviewed by me and considered in my medical decision making (see chart for details).  85 year old female status post fall.  She thinks she lost her balance was not completely sure.  Hemodynamically stable currently.  Her EKG shows a sinus rhythm with no overt concerning findings.  We will update her tetanus.  Obvious head trauma but denies any significant pain at an neuro exam is at baseline.  Will CT her head and neck.  Imaging of both her knees.  Some faint ecchymosis to left shoulder she denies any segment pain and there is no apparent pain with active range of motion.  Significant for right patella fracture.  Closed injury.  She has walker at home.  Will place in a knee immobilizer.  As needed pain medication.  Orthopedic follow-up. Wound care.   Final Clinical Impressions(s) / ED Diagnoses   Final diagnoses:  Hematoma of scalp, initial encounter  Abrasions of multiple sites  Closed nondisplaced fracture of right patella, unspecified fracture morphology, initial encounter    ED Discharge Orders    None       Virgel Manifold, MD 03/06/19 1051

## 2019-03-02 NOTE — ED Triage Notes (Addendum)
Fall arrived ems  C/o head pain, lac to back of head, nose,  hamartoma to left forehead  ? Loc  Bleeding controlled large hematoma to rt knee and bruising to left knee  Not on blood thinners

## 2019-05-12 IMAGING — DX DG SHOULDER 1V*L*
2 series · 2 of 2 positions shown · non-contrast
Comparison: 02/20/2017

CLINICAL DATA: Pain after a fall

EXAM:
LEFT SHOULDER - 1 VIEW

[shoulder ap (1 of 2)]
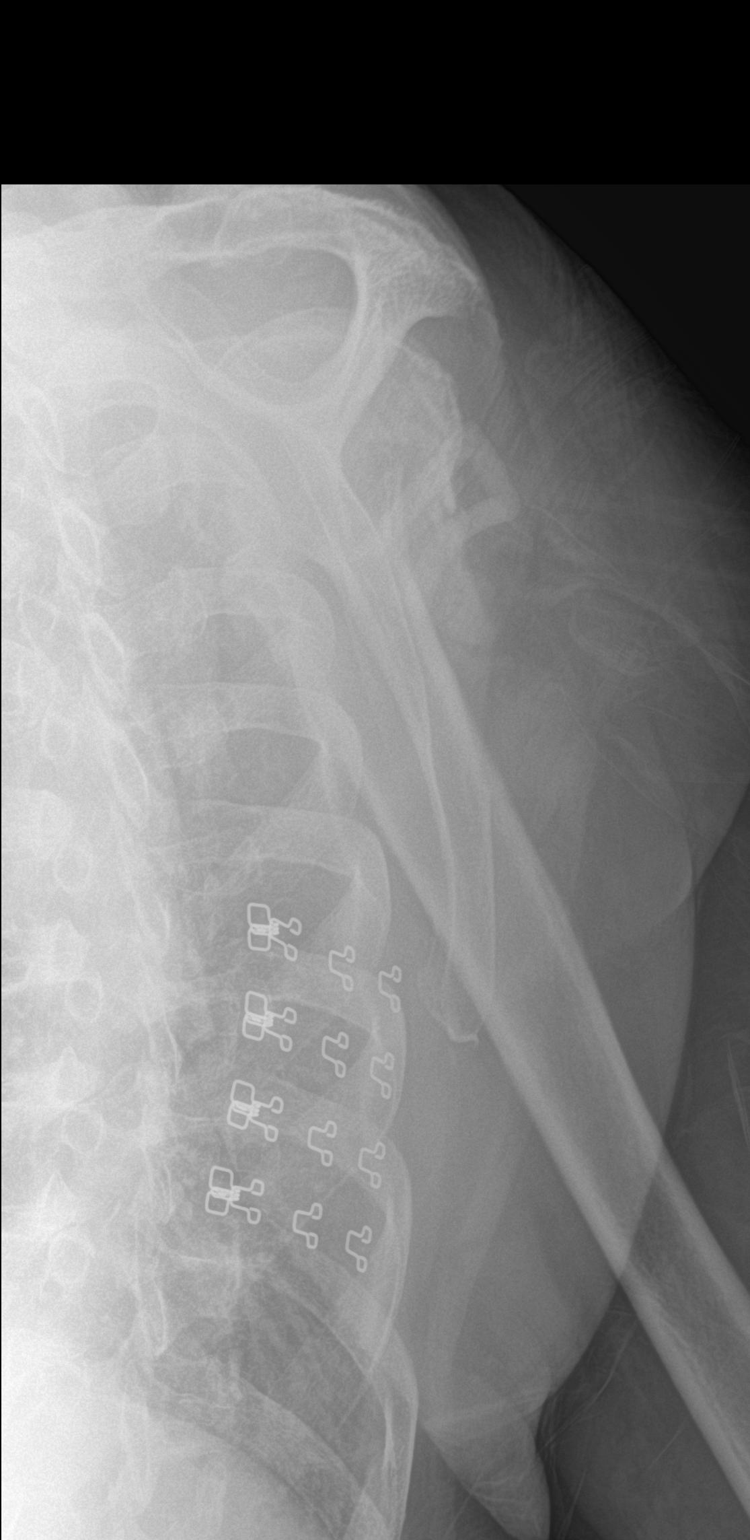

[shoulder ap (2 of 2)]
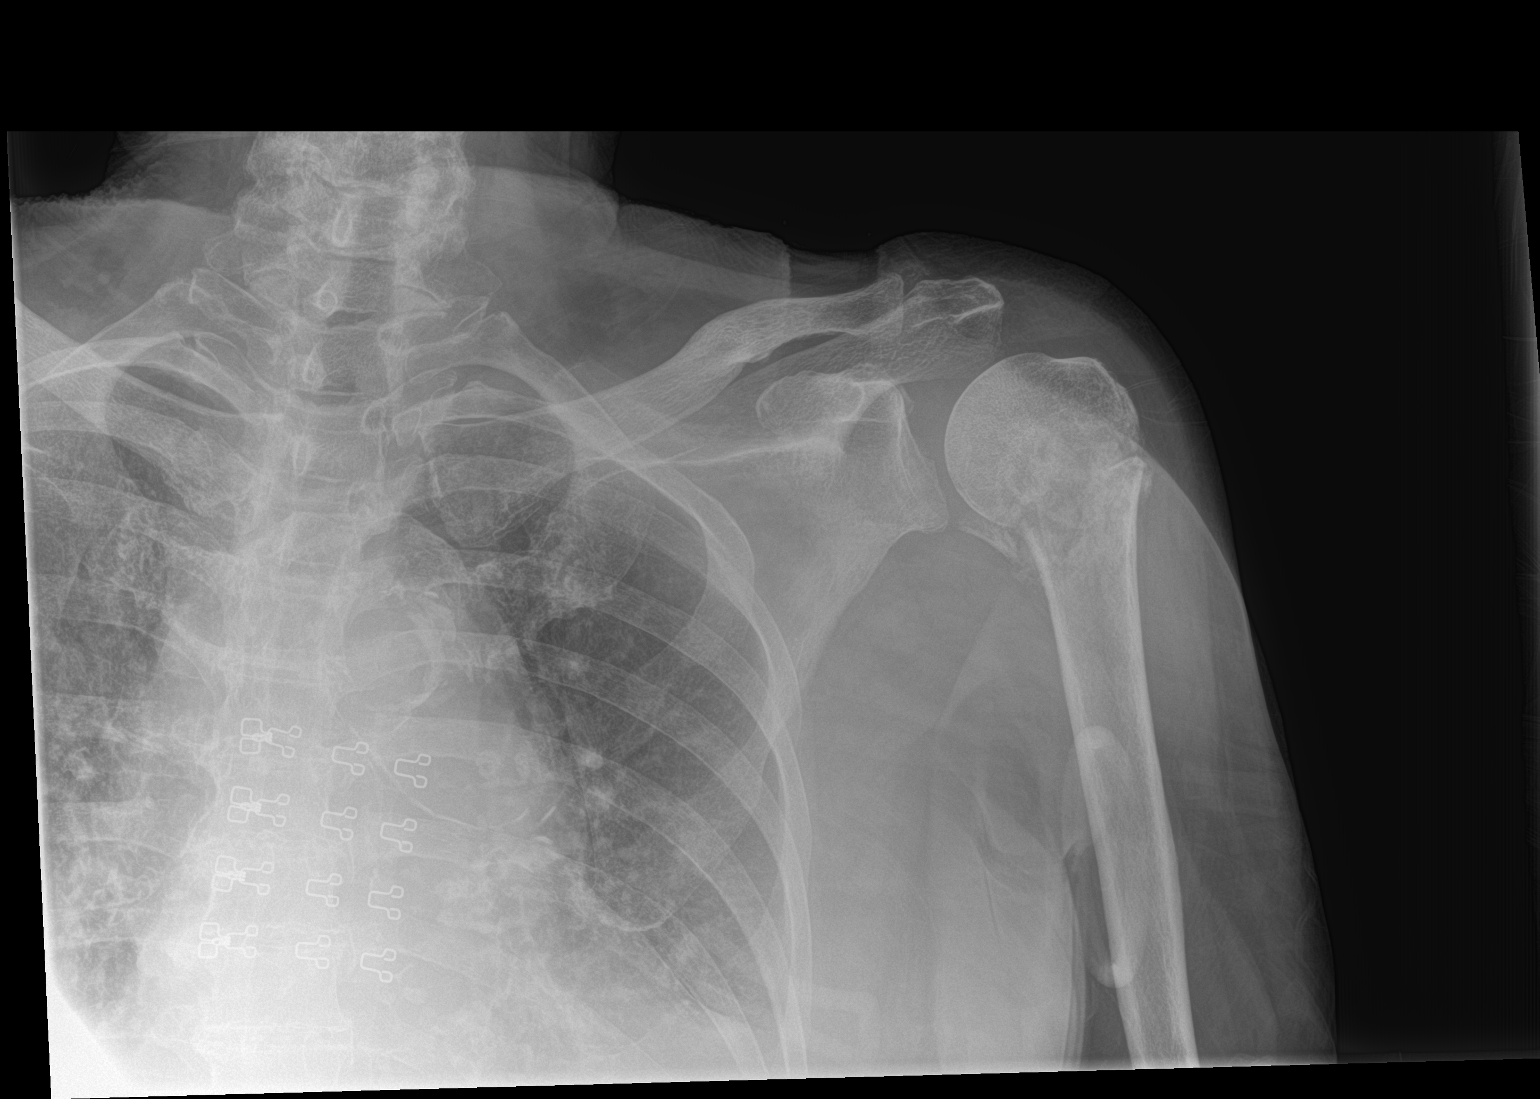

[2 of 2 positions shown; findings below may reference images not displayed]

FINDINGS: Re- demonstrated displaced fracture at the left humeral neck,
similar alignment 1 allowing for differences in positioning.
Bridging callus evident consistent with subacute process. No humeral
head dislocation.
IMPRESSION: Healing left humeral neck fracture without significant change in
alignment allowing for differences in positioning

## 2019-08-25 ENCOUNTER — Emergency Department (HOSPITAL_COMMUNITY): Payer: Medicare Other

## 2019-08-25 ENCOUNTER — Emergency Department (HOSPITAL_COMMUNITY)
Admission: EM | Admit: 2019-08-25 | Discharge: 2019-08-25 | Disposition: A | Discharge status: Home / Self Care | Payer: Medicare Other | Attending: Emergency Medicine | Admitting: Emergency Medicine

## 2019-08-25 ENCOUNTER — Other Ambulatory Visit: Payer: Self-pay

## 2019-08-25 DIAGNOSIS — Z23 Encounter for immunization: Secondary | ICD-10-CM | POA: Insufficient documentation

## 2019-08-25 DIAGNOSIS — R0609 Other forms of dyspnea: Secondary | ICD-10-CM | POA: Diagnosis not present

## 2019-08-25 DIAGNOSIS — Y92410 Unspecified street and highway as the place of occurrence of the external cause: Secondary | ICD-10-CM | POA: Insufficient documentation

## 2019-08-25 DIAGNOSIS — Y999 Unspecified external cause status: Secondary | ICD-10-CM | POA: Insufficient documentation

## 2019-08-25 DIAGNOSIS — K5730 Diverticulosis of large intestine without perforation or abscess without bleeding: Secondary | ICD-10-CM | POA: Insufficient documentation

## 2019-08-25 DIAGNOSIS — S0182XA Laceration with foreign body of other part of head, initial encounter: Secondary | ICD-10-CM | POA: Insufficient documentation

## 2019-08-25 DIAGNOSIS — S0181XA Laceration without foreign body of other part of head, initial encounter: Secondary | ICD-10-CM

## 2019-08-25 DIAGNOSIS — Y9389 Activity, other specified: Secondary | ICD-10-CM | POA: Diagnosis not present

## 2019-08-25 DIAGNOSIS — S8012XA Contusion of left lower leg, initial encounter: Secondary | ICD-10-CM | POA: Diagnosis not present

## 2019-08-25 DIAGNOSIS — S81811A Laceration without foreign body, right lower leg, initial encounter: Secondary | ICD-10-CM

## 2019-08-25 DIAGNOSIS — S81812A Laceration without foreign body, left lower leg, initial encounter: Secondary | ICD-10-CM | POA: Insufficient documentation

## 2019-08-25 DIAGNOSIS — S8011XA Contusion of right lower leg, initial encounter: Secondary | ICD-10-CM | POA: Diagnosis not present

## 2019-08-25 DIAGNOSIS — T148XXA Other injury of unspecified body region, initial encounter: Secondary | ICD-10-CM

## 2019-08-25 DIAGNOSIS — Z96643 Presence of artificial hip joint, bilateral: Secondary | ICD-10-CM | POA: Diagnosis not present

## 2019-08-25 DIAGNOSIS — S01112A Laceration without foreign body of left eyelid and periocular area, initial encounter: Secondary | ICD-10-CM | POA: Insufficient documentation

## 2019-08-25 DIAGNOSIS — R4182 Altered mental status, unspecified: Secondary | ICD-10-CM | POA: Diagnosis not present

## 2019-08-25 LAB — CBC
HCT: 37.7 % (ref 36.0–46.0)
Hemoglobin: 12.1 g/dL (ref 12.0–15.0)
MCH: 27.7 pg (ref 26.0–34.0)
MCHC: 32.1 g/dL (ref 30.0–36.0)
MCV: 86.3 fL (ref 80.0–100.0)
Platelets: 302 K/uL (ref 150–400)
RBC: 4.37 MIL/uL (ref 3.87–5.11)
RDW: 14.8 % (ref 11.5–15.5)
WBC: 10.7 K/uL — ABNORMAL HIGH (ref 4.0–10.5)
nRBC: 0 % (ref 0.0–0.2)

## 2019-08-25 LAB — ETHANOL: Alcohol, Ethyl (B): <10 mg/dL (ref <10)

## 2019-08-25 LAB — COMPREHENSIVE METABOLIC PANEL
ALT: 19 U/L (ref 0–44)
AST: 26 U/L (ref 15–41)
Albumin: 3.5 g/dL (ref 3.5–5.0)
Alkaline Phosphatase: 43 U/L (ref 38–126)
Anion gap: 12 (ref 5–15)
BUN: 13 mg/dL (ref 8–23)
CO2: 24 mmol/L (ref 22–32)
Calcium: 9.2 mg/dL (ref 8.9–10.3)
Chloride: 101 mmol/L (ref 98–111)
Creatinine, Ser: 0.93 mg/dL (ref 0.44–1.00)
GFR calc Af Amer: >60 mL/min (ref >60)
GFR calc non Af Amer: 55 mL/min — ABNORMAL LOW (ref >60)
Glucose, Bld: 182 mg/dL — ABNORMAL HIGH (ref 70–99)
Potassium: 3.6 mmol/L (ref 3.5–5.1)
Sodium: 137 mmol/L (ref 135–145)
Total Bilirubin: 0.7 mg/dL (ref 0.3–1.2)
Total Protein: 7.3 g/dL (ref 6.5–8.1)

## 2019-08-25 LAB — PROTIME-INR
INR: 1.1 (ref 0.8–1.2)
Prothrombin Time: 13.9 s (ref 11.4–15.2)

## 2019-08-25 LAB — SAMPLE TO BLOOD BANK

## 2019-08-25 LAB — CDS SEROLOGY

## 2019-08-25 MED ORDER — IOHEXOL 300 MG/ML  SOLN
100.0000 mL | Freq: Once | INTRAMUSCULAR | Status: AC | PRN
  Administered 2019-08-25: 100 mL via INTRAVENOUS

## 2019-08-25 MED ORDER — TETANUS-DIPHTH-ACELL PERTUSSIS 5-2.5-18.5 LF-MCG/0.5 IM SUSP
0.5000 mL | Freq: Once | INTRAMUSCULAR | Status: AC
  Administered 2019-08-25: 0.5 mL via INTRAMUSCULAR
  Filled 2019-08-25: qty 0.5

## 2019-08-25 MED ORDER — LIDOCAINE-EPINEPHRINE (PF) 2 %-1:200000 IJ SOLN
INTRAMUSCULAR | Status: AC
  Filled 2019-08-25: qty 20
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
## 2019-08-25 NOTE — Progress Notes (Signed)
Orthopedic Tech Progress Note Patient Details:  Sierra [name] 22-May-1928 [id number] Level 2 trauma Patient ID: [name], female   DOB: 1929/01/15, 86 y.o.   MRN: [id number]   Janit Pagan 08/25/2019, 3:25 PM
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
## 2019-08-25 NOTE — ED Notes (Signed)
Patient verbalizes understanding of discharge instructions. Opportunity for questioning and answers were provided. Armband removed by staff, pt discharged from ED ambulatory.   
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
## 2019-08-25 NOTE — Discharge Instructions (Addendum)
Return to the emergency department immediately if you develop any of the following symptoms: You have numbness, tingling, or weakness in the arms or legs. You develop severe headaches not relieved with medicine. You have severe neck pain, especially tenderness in the middle of the back of your neck. You have changes in bowel or bladder control. There is increasing pain in any area of the body. You have shortness of breath, light-headedness, dizziness, or fainting. You have chest pain. You feel sick to your stomach (nauseous), throw up (vomit), or sweat. You have increasing abdominal discomfort. There is blood in your urine, stool, or vomit. You have pain in your shoulder (shoulder strap areas). You feel your symptoms are getting worse.  WOUND CARE Please have your stitches/staples removed in 7 days or sooner if you have concerns. You may do this at any available urgent care or at your primary care doctor's office.  Keep area clean and dry for 24 hours. Do not remove bandage, if applied.  After 24 hours, remove bandage and wash wound gently with mild soap and warm water. Reapply a new bandage after cleaning wound, if directed.  Continue daily cleansing with soap and water until stitches/staples are removed.  Do not apply any ointments or creams to the wound while stitches/staples are in place, as this may cause delayed healing.  Seek medical careif you experience any of the following signs of infection: Swelling, redness, pus drainage, streaking, fever >101.0 F  Seek care if you experience excessive bleeding that does not stop after 15-20 minutes of constant, firm pressure.

## 2019-12-31 ENCOUNTER — Other Ambulatory Visit: Payer: Self-pay

## 2019-12-31 ENCOUNTER — Emergency Department (HOSPITAL_BASED_OUTPATIENT_CLINIC_OR_DEPARTMENT_OTHER): Payer: Medicare Other

## 2019-12-31 ENCOUNTER — Emergency Department (HOSPITAL_BASED_OUTPATIENT_CLINIC_OR_DEPARTMENT_OTHER)
Admission: EM | Admit: 2019-12-31 | Discharge: 2020-01-01 | Disposition: A | Discharge status: Home / Self Care | Payer: Medicare Other | Attending: Emergency Medicine | Admitting: Emergency Medicine

## 2019-12-31 ENCOUNTER — Encounter (HOSPITAL_BASED_OUTPATIENT_CLINIC_OR_DEPARTMENT_OTHER): Payer: Self-pay | Admitting: Emergency Medicine

## 2019-12-31 DIAGNOSIS — N390 Urinary tract infection, site not specified: Secondary | ICD-10-CM | POA: Diagnosis present

## 2019-12-31 DIAGNOSIS — E039 Hypothyroidism, unspecified: Secondary | ICD-10-CM | POA: Insufficient documentation

## 2019-12-31 DIAGNOSIS — N182 Chronic kidney disease, stage 2 (mild): Secondary | ICD-10-CM | POA: Insufficient documentation

## 2019-12-31 DIAGNOSIS — R0682 Tachypnea, not elsewhere classified: Secondary | ICD-10-CM | POA: Insufficient documentation

## 2019-12-31 DIAGNOSIS — Z96641 Presence of right artificial hip joint: Secondary | ICD-10-CM | POA: Insufficient documentation

## 2019-12-31 DIAGNOSIS — Z20822 Contact with and (suspected) exposure to covid-19: Secondary | ICD-10-CM | POA: Insufficient documentation

## 2019-12-31 DIAGNOSIS — Z79899 Other long term (current) drug therapy: Secondary | ICD-10-CM | POA: Insufficient documentation

## 2019-12-31 DIAGNOSIS — I129 Hypertensive chronic kidney disease with stage 1 through stage 4 chronic kidney disease, or unspecified chronic kidney disease: Secondary | ICD-10-CM | POA: Insufficient documentation

## 2019-12-31 DIAGNOSIS — G9340 Encephalopathy, unspecified: Secondary | ICD-10-CM | POA: Insufficient documentation

## 2019-12-31 DIAGNOSIS — R509 Fever, unspecified: Secondary | ICD-10-CM

## 2019-12-31 LAB — COMPREHENSIVE METABOLIC PANEL
ALT: 13 U/L (ref 0–44)
AST: 20 U/L (ref 15–41)
Albumin: 3.4 g/dL — ABNORMAL LOW (ref 3.5–5.0)
Alkaline Phosphatase: 38 U/L (ref 38–126)
Anion gap: 10 (ref 5–15)
BUN: 20 mg/dL (ref 8–23)
CO2: 24 mmol/L (ref 22–32)
Calcium: 8.7 mg/dL — ABNORMAL LOW (ref 8.9–10.3)
Chloride: 99 mmol/L (ref 98–111)
Creatinine, Ser: 1.07 mg/dL — ABNORMAL HIGH (ref 0.44–1.00)
GFR calc Af Amer: 54 mL/min — ABNORMAL LOW (ref >60)
GFR calc non Af Amer: 46 mL/min — ABNORMAL LOW (ref >60)
Glucose, Bld: 144 mg/dL — ABNORMAL HIGH (ref 70–99)
Potassium: 4 mmol/L (ref 3.5–5.1)
Sodium: 133 mmol/L — ABNORMAL LOW (ref 135–145)
Total Bilirubin: 0.7 mg/dL (ref 0.3–1.2)
Total Protein: 7.5 g/dL (ref 6.5–8.1)

## 2019-12-31 LAB — CBC WITH DIFFERENTIAL/PLATELET
Abs Immature Granulocytes: 0.08 10*3/uL — ABNORMAL HIGH (ref 0.00–0.07)
Basophils Absolute: 0 10*3/uL (ref 0.0–0.1)
Basophils Relative: 0 %
Eosinophils Absolute: 0.2 10*3/uL (ref 0.0–0.5)
Eosinophils Relative: 2 %
HCT: 33.4 % — ABNORMAL LOW (ref 36.0–46.0)
Hemoglobin: 10.2 g/dL — ABNORMAL LOW (ref 12.0–15.0)
Immature Granulocytes: 1 %
Lymphocytes Relative: 16 %
Lymphs Abs: 2 10*3/uL (ref 0.7–4.0)
MCH: 23.8 pg — ABNORMAL LOW (ref 26.0–34.0)
MCHC: 30.5 g/dL (ref 30.0–36.0)
MCV: 78 fL — ABNORMAL LOW (ref 80.0–100.0)
Monocytes Absolute: 1.3 10*3/uL — ABNORMAL HIGH (ref 0.1–1.0)
Monocytes Relative: 10 %
Neutro Abs: 9.4 10*3/uL — ABNORMAL HIGH (ref 1.7–7.7)
Neutrophils Relative %: 71 %
Platelets: 287 10*3/uL (ref 150–400)
RBC: 4.28 MIL/uL (ref 3.87–5.11)
RDW: 18 % — ABNORMAL HIGH (ref 11.5–15.5)
WBC: 13 10*3/uL — ABNORMAL HIGH (ref 4.0–10.5)
nRBC: 0 % (ref 0.0–0.2)

## 2019-12-31 LAB — URINALYSIS, ROUTINE W REFLEX MICROSCOPIC
Bilirubin Urine: NEGATIVE
Glucose, UA: NEGATIVE mg/dL
Hgb urine dipstick: NEGATIVE
Ketones, ur: NEGATIVE mg/dL
Leukocytes,Ua: NEGATIVE
Nitrite: NEGATIVE
Protein, ur: NEGATIVE mg/dL
Specific Gravity, Urine: 1.02 (ref 1.005–1.030)
pH: 5.5 (ref 5.0–8.0)

## 2019-12-31 LAB — SARS CORONAVIRUS 2 BY RT PCR (HOSPITAL ORDER, PERFORMED IN ~~LOC~~ HOSPITAL LAB): SARS Coronavirus 2: NEGATIVE

## 2019-12-31 LAB — LACTIC ACID, PLASMA: Lactic Acid, Venous: 1.6 mmol/L (ref 0.5–1.9)

## 2019-12-31 MED ORDER — LACTATED RINGERS IV BOLUS
1000.0000 mL | Freq: Once | INTRAVENOUS | Status: AC
  Administered 2019-12-31: 1000 mL via INTRAVENOUS

## 2019-12-31 MED ORDER — ACETAMINOPHEN 325 MG PO TABS
650.0000 mg | ORAL_TABLET | Freq: Once | ORAL | Status: AC
  Administered 2019-12-31: 650 mg via ORAL
  Filled 2019-12-31: qty 2

## 2019-12-31 NOTE — ED Triage Notes (Signed)
Brought by family via pov from [place].  Per spouse more confused than usual.  Frequent urination.
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
## 2019-12-31 NOTE — ED Provider Notes (Signed)
[facility] EMERGENCY DEPARTMENT Provider Note   CSN: [id number] Arrival date & time: 12/31/19  1954     History Chief Complaint  Patient presents with  . Recurrent UTI    Sierra [name] is a 86 y.o. female.  Patient is an 86 year old female with a history of dementia, adrenal insufficiency on prednisone chronically, CKD, hypertension, hyperlipidemia, thrombocytosis and has been vaccinated against Covid who presents today with her family members for several complaints.  They report earlier today she had not been acting herself.  She had poor oral intake today and was seeing things like bears attacking her and thought her husband was trying to kill her.  Son reports that she was having a hard time figuring out how to sit in the car and seemed generally weak.  Last week she had had a scratchy throat and URI symptoms and been treated with a Z-Pak but seem to have improved.  They were concerned she may have a urinary tract infection as she has had infections in the past.  She has had no vomiting or diarrhea.  No known sick contacts.  Currently patient is awake but denies any pain.  She denies any shortness of breath, cough or headaches.  The history is provided by a relative.       Past Medical History:  Diagnosis Date  . Acute asthmatic bronchitis   . Acute respiratory failure with hypoxia ([place]) 06/11/2016  . Allergic rhinitis   . Arthritis    Hx R shoulder bursitis, pain L knee and L hip  . Asthmatic bronchitis   . ATYPICAL MYCOBACTERIAL INFECTION 05/05/2010   Sputum cx Pos 12/ 2011    . Balance problem    SINCE BRAIN TUMOR REMOVED IN 2002-BENIGN TUMOR-PT HAS ADRENAL INSUFFICIENCY AND TAKES DAILY PREDNISONE  . Blood transfusion   . Cardiac murmur    DOES NOT CAUSE ANY SYMPTOMS  . Chronic kidney disease   . Chronic pharyngitis   . Colonic diverticular abscess 02/01/2017  . Diverticulitis, colon   . Dizziness   . DOE (dyspnea on exertion)   . Esophageal reflux   . GI  bleed 01/06/2013  . Hyperlipemia   . Hypertension   . Hypothyroidism   . IBS (irritable bowel syndrome)   . Leukocytosis   . LGI bleed 01/05/2013  . Other specified iron deficiency anemias   . PNEUMONIA 06/02/2010   Qualifier: Diagnosis of  By: Annamaria Boots MD, Clinton D   . Postop Hyponatremia 07/28/2011  . Rectal bleeding 01/03/2014  . Recurrent upper respiratory infection (URI) 07/10/2011    ACUTE BRONCHITIS - extra prednisone in addition to the daily prednisone and a Z-Pak  . Thrombocytosis ([place])   . THRUSH 04/30/2009   Qualifier: Diagnosis of  By: Annamaria Boots MD, Clinton D   . Ulceration, colon   . Ulcerative colitis     Patient Active Problem List   Diagnosis Date Noted  . Wound of left ankle 05/20/2017  . Pancreatic mass 03/25/2017  . Post-concussion vertigo 03/10/2017  . Post concussive syndrome 03/06/2017  . Concussion with no loss of consciousness   . Fall   . Dementia without behavioral disturbance ([place]) 03/03/2017  . Memory loss 12/17/2016  . Gait abnormality 12/17/2016  . CSF rhinorrhea 11/12/2016  . Edema extremities 07/13/2016  . Dyspnea on exertion 12/04/2015  . Anemia 12/12/2014  . Anxiety 12/12/2014  . Asthma 12/12/2014  . Chronic kidney disease, stage II (mild) 12/12/2014  . H/O: GI bleed 12/12/2014  . Postural lightheadedness  12/12/2014  . 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[place]) 12/12/2014  . Adrenal insufficiency ([place]) 01/03/2014  . Ulcerative colitis, chronic ([place]) 01/03/2014  . Meningioma ([place]) 06/08/2013  . Acute blood loss anemia 01/06/2013  . Malaise and fatigue 09/21/2011  . Benign hypertensive heart disease without congestive heart failure 09/21/2011  . Atrial premature contractions 09/21/2011  . Osteoarthritis of hip 07/27/2011  . DYSPNEA ON EXERTION 05/30/2010  . Hypothyroidism 05/01/2010  . HOARSENESS, CHRONIC 11/05/2009  . Asthmatic bronchitis, mild persistent, uncomplicated 97/67/3419  . Hyperlipidemia 07/11/2007  . ANEMIA, IRON DEFICIENCY, MICROCYTIC  07/11/2007  . LEUKOCYTOSIS 07/11/2007  . THROMBOCYTOSIS 07/11/2007  . PHARYNGITIS, CHRONIC 07/11/2007  . Nonallergic vasomotor rhinitis 07/11/2007  . Esophageal reflux 07/11/2007  . DIVERTICULITIS, COLON 07/11/2007  . ARTHRITIS 07/11/2007  . CARDIAC MURMUR 07/11/2007  . Diverticulitis of colon 07/11/2007    Past Surgical History:  Procedure Laterality Date  . ABDOMINAL HYSTERECTOMY  1985  . APPENDECTOMY    . COLONOSCOPY N/A 01/05/2013   Procedure: COLONOSCOPY;  Surgeon: Cleotis Nipper, MD;  Location[facility] ENDOSCOPY;  Service: Endoscopy;  Laterality: N/A;  . COLONOSCOPY N/A 01/04/2014   Procedure: COLONOSCOPY;  Surgeon: Winfield Cunas., MD;  Location: [facility] ENDOSCOPY;  Service: Endoscopy;  Laterality: N/A;  . CRANIOTOMY    . DILATION AND CURETTAGE OF UTERUS  1967  . ESOPHAGOGASTRODUODENOSCOPY N/A 01/05/2013   Procedure: ESOPHAGOGASTRODUODENOSCOPY (EGD);  Surgeon: Cleotis Nipper, MD;  Location: [facility] ENDOSCOPY;  Service: Endoscopy;  Laterality: N/A;  . ESOPHAGOGASTRODUODENOSCOPY N/A 06/26/2014   Procedure: ESOPHAGOGASTRODUODENOSCOPY (EGD);  Surgeon: Winfield Cunas., MD;  Location: [facility] ENDOSCOPY;  Service: Endoscopy;  Laterality: N/A;  . EYE SURGERY     2003 -RIGHT CATARACT EXTRACTED AND LEFT WAS DONE IN 2004  . FLEXIBLE SIGMOIDOSCOPY N/A 06/26/2014   Procedure: FLEXIBLE SIGMOIDOSCOPY;  Surgeon: Winfield Cunas., MD;  Location: [facility] ENDOSCOPY;  Service: Endoscopy;  Laterality: N/A;  . FRONTALIS SUSPENSION  10-09-2010   lifting eyelids AND SECOND EYE SURGERY November 19, 2010  . meningioma resected  20O2  . PARTIAL COLECTOMY  2008  . subglottal mucocoel  2000  . THYROIDECTOMY  1986  . TONSILLECTOMY  1938  . TOTAL HIP ARTHROPLASTY  OCT 2006   right  . TOTAL HIP ARTHROPLASTY  07/27/2011   Procedure: TOTAL HIP ARTHROPLASTY;  Surgeon: Gearlean Alf, MD;  Location: [facility] ORS;  Service: Orthopedics;  Laterality: Left;  Marland Kitchen VESICOVAGINAL FISTULA CLOSURE W/ TAH    . WRIST TENDON LESION REMOVED  2007       OB History   No obstetric history on file.     Family History  Problem Relation Age of Onset  . Heart attack Father        deceased    Social History   Tobacco Use  . Smoking status: Never Smoker  . Smokeless tobacco: Never Used  Vaping Use  . Vaping Use: Never used  Substance Use Topics  . Alcohol use: Yes    Comment: occ glass of wine  . Drug use: No    Home Medications Prior to Admission medications   Medication Sig Start Date End Date Taking? Authorizing Provider  albuterol (PROVENTIL HFA) 108 (90 Base) MCG/ACT inhaler Inhale 2 puffs into the lungs daily as needed for shortness of breath.    [provider]  amLODipine (NORVASC) 2.5 MG tablet Take 2.5 mg by mouth daily.    [provider]  Artificial Tear Solution (BION TEARS) 0.1-0.3 % SOLN Place 1 drop into both eyes  every morning.     [provider]  Artificial Tear Solution (GENTEAL TEARS) 0.1-0.2-0.3 % SOLN Place 1 drop into both eyes at bedtime.    [provider]  Calcium Carbonate-Vitamin D3 (CALCIUM 600-D) 600-400 MG-UNIT TABS Take 1 tablet by mouth daily.    [provider]  cefdinir (OMNICEF) 300 MG capsule Take 1 capsule (300 mg total) by mouth 2 (two) times daily. 04/07/18   Isla Pence, MD  divalproex (DEPAKOTE) 125 MG DR tablet Take 125 mg by mouth 3 (three) times daily.    [provider]  donepezil (ARICEPT) 5 MG tablet Take 5 mg by mouth at bedtime.    [provider]  escitalopram (LEXAPRO) 5 MG tablet Take 1 tablet (5 mg total) by mouth daily. Pt needs f/u appt with provider. 09/16/17   Emeterio Reeve, DO  famotidine (PEPCID) 40 MG tablet Take 40 mg by mouth daily.    [provider]  fluticasone (FLOVENT HFA) 110 MCG/ACT inhaler Inhale 2 puffs into the lungs 2 (two) times daily. 05/10/18   Breeback, Royetta Car, PA-C  guaiFENesin (MUCINEX) 600 MG 12 hr tablet Take 600 mg by mouth 2 (two) times daily.    [provider]  levothyroxine (SYNTHROID) 75 MCG tablet Take 75 mcg by mouth daily. 12/03/17   [provider]  mesalamine (LIALDA) 1.2 g EC tablet Take 1.2 g by mouth daily with breakfast.    [provider]  mirabegron ER (MYRBETRIQ) 25 MG TB24 tablet Take 1 tablet (25 mg total) by mouth daily. Pt needs a f/u appt w/Provider. No more refills. 04/05/18   Emeterio Reeve, DO  Multiple Vitamins-Minerals (CENTRUM SILVER 50+WOMEN PO) Take 1 tablet daily by mouth.     [provider]  Omega-3 Fatty Acids (FISH OIL) 1000 MG CAPS Take 1,000 mg by mouth daily.     [provider]  predniSONE (DELTASONE) 10 MG tablet Take 1 tablet (10 mg total) by mouth daily with breakfast. 06/29/17   Emeterio Reeve, DO  traMADol (ULTRAM) 50 MG tablet Take 1 tablet (50 mg total) by mouth every 8 (eight) hours as needed. 03/02/19   Virgel Manifold, MD  Alum & Mag Hydroxide-Simeth (MAGIC MOUTHWASH) SOLN Take 5 mLs by mouth 3 (three) times daily as needed. Thrush   07/21/11  [provider]    Allergies    Sulfamethoxazole, Tape, Biaxin [clarithromycin], Ciprofloxacin, Codeine, Dilantin [phenytoin], Doxycycline, Lactose intolerance (gi), Restasis [cyclosporine], and Sulfonamide derivatives  Review of Systems   Review of Systems  All other systems reviewed and are negative.   Physical Exam Updated Vital Signs BP (!) 117/52   Pulse 84   Temp 100.3 F (37.9 C) (Rectal)   Resp (!) 23   Ht 5\' 3"  (1.6 m)   Wt 57.6 kg   SpO2 94%   BMI 22.49 kg/m   Physical Exam Vitals and nursing note reviewed.  Constitutional:      General: She is not in acute distress.    Appearance: Normal appearance. She is well-developed and normal weight.  HENT:     Head: Normocephalic and atraumatic.  Eyes:     Pupils: Pupils are equal, round, and reactive to light.  Cardiovascular:     Rate and Rhythm: Normal rate and regular rhythm.     Heart sounds: Normal heart sounds. No murmur  heard.  No friction rub.  Pulmonary:     Effort: Pulmonary effort is normal. Tachypnea present.     Breath sounds: Normal  breath sounds. No wheezing or rales.  Abdominal:     General: Bowel sounds are normal. There is no distension.     Palpations: Abdomen is soft.     Tenderness: There is no abdominal tenderness. There is no guarding or rebound.  Musculoskeletal:        General: No tenderness. Normal range of motion.     Cervical back: No tenderness.     Right lower leg: No edema.     Left lower leg: No edema.     Comments: No edema  Skin:    General: Skin is warm and dry.     Findings: No rash.     Comments: Warm to the touch  Neurological:     Mental Status: She is alert.     Cranial Nerves: No cranial nerve deficit.     Comments: Oriented to person and place but not to time.  Slow to answer questions and seems confused.  Psychiatric:        Behavior: Behavior normal.     Comments: Calm and cooperative     ED Results / Procedures / Treatments   Labs (all labs ordered are listed, but only abnormal results are displayed) Labs Reviewed  CBC WITH DIFFERENTIAL/PLATELET - Abnormal; Notable for the following components:      Result Value   WBC 13.0 (*)    Hemoglobin 10.2 (*)    HCT 33.4 (*)    MCV 78.0 (*)    MCH 23.8 (*)    RDW 18.0 (*)    Neutro Abs 9.4 (*)    Monocytes Absolute 1.3 (*)    Abs Immature Granulocytes 0.08 (*)    All other components within normal limits  COMPREHENSIVE METABOLIC PANEL - Abnormal; Notable for the following components:   Sodium 133 (*)    Glucose, Bld 144 (*)    Creatinine, Ser 1.07 (*)    Calcium 8.7 (*)    Albumin 3.4 (*)    GFR calc non Af Amer 46 (*)    GFR calc Af Amer 54 (*)    All other components within normal limits  URINE CULTURE  CULTURE, BLOOD (ROUTINE X 2)  CULTURE, BLOOD (ROUTINE X 2)  SARS CORONAVIRUS 2 BY RT PCR (HOSPITAL ORDER, [facility])  URINALYSIS, ROUTINE W REFLEX MICROSCOPIC  LACTIC  ACID, PLASMA    EKG EKG Interpretation  Date/Time:  Sunday December 31 2019 20:38:54 EDT Ventricular Rate:  91 PR Interval:    QRS Duration: 129 QT Interval:  380 QTC Calculation: 468 R Axis:   51 Text Interpretation: Sinus rhythm Ventricular premature complex Aberrant conduction of SV complex(es) Right bundle branch block No significant change since last tracing Confirmed by Blanchie Dessert [phone]) on 12/31/2019 10:52:12 PM   Radiology DG Chest Port 1 View  Result Date: 12/31/2019 CLINICAL DATA:  Fever, cough, Recurrent UTI EXAM: PORTABLE CHEST 1 VIEW COMPARISON:  08/25/2019 FINDINGS: Single frontal view of the chest demonstrates stable enlargement the cardiac silhouette. No acute airspace disease, effusion, or pneumothorax. No acute bony abnormalities. IMPRESSION: 1. No acute intrathoracic process. Electronically Signed   By: Randa Ngo M.D.   On: 12/31/2019 22:05    Procedures Procedures (including critical care time)  Medications Ordered in ED Medications  acetaminophen (TYLENOL) tablet 650 mg (has no administration in time range)  lactated ringers bolus 1,000 mL (0 mLs Intravenous Stopped 12/31/19 2242)    ED Course  I have reviewed the triage vital signs and the  nursing notes.  Pertinent labs & imaging results that were available during my care of the patient were reviewed by me and considered in my medical decision making (see chart for details).    MDM Rules/Calculators/A&P                          Elderly patient who is presenting today with delirium that started today.  Patient is nontoxic-appearing but is confused.  Vital signs are febrile at 100.3 but otherwise within normal limits.  At this time patient does not meet sepsis criteria.  Chest x-ray within normal limits, lactate within normal limits, no localized abdominal pain or evidence of cellulitis.  CBC with leukocytosis of 13,000 which is fairly normal for her, hemoglobin unchanged.  CMP without acute  findings.  UA without signs of infection.  Head CT due to the delirium pending.  Covid test pending.  After fluids and Tylenol patient is now more appropriate.  Blood cultures and urine cultures are pending.  Patient has not started any new medication that would be the cause of her symptoms.  Suspect early infectious process.  Final Clinical Impression(s) / ED Diagnoses Final diagnoses:  None    Rx / DC Orders ED Discharge Orders    None       Blanchie Dessert, MD 01/01/20 2346

## 2019-12-31 NOTE — ED Notes (Signed)
Staff at bedside, labs.  Return for pcxr.

## 2020-01-01 LAB — URINE CULTURE: Culture: NO GROWTH

## 2020-01-01 NOTE — ED Notes (Signed)
Per son pt seems to be back to baseline. When pt is asked how she is feeling she says "I feel great!", she denies pain and is aware that she is at a medical facility. Pt is quiet and compliant with all requests.

## 2020-01-01 NOTE — ED Notes (Signed)
Pt able to ambulate. Ment[place] at baseline per son.

## 2020-01-06 LAB — CULTURE, BLOOD (ROUTINE X 2)
Culture: NO GROWTH
Culture: NO GROWTH
Special Requests: ADEQUATE
Special Requests: ADEQUATE

## 2020-01-29 ENCOUNTER — Emergency Department (HOSPITAL_COMMUNITY)
Admission: EM | Admit: 2020-01-29 | Discharge: 2020-01-29 | Disposition: A | Discharge status: Home / Self Care | Payer: Medicare Other | Attending: Emergency Medicine | Admitting: Emergency Medicine

## 2020-01-29 ENCOUNTER — Emergency Department (HOSPITAL_COMMUNITY): Payer: Medicare Other

## 2020-01-29 ENCOUNTER — Encounter (HOSPITAL_COMMUNITY): Payer: Self-pay | Admitting: *Deleted

## 2020-01-29 ENCOUNTER — Other Ambulatory Visit: Payer: Self-pay

## 2020-01-29 DIAGNOSIS — Z5321 Procedure and treatment not carried out due to patient leaving prior to being seen by health care provider: Secondary | ICD-10-CM | POA: Insufficient documentation

## 2020-01-29 DIAGNOSIS — R079 Chest pain, unspecified: Secondary | ICD-10-CM | POA: Insufficient documentation

## 2020-01-29 LAB — BASIC METABOLIC PANEL
Anion gap: 13 (ref 5–15)
BUN: 21 mg/dL (ref 8–23)
CO2: 25 mmol/L (ref 22–32)
Calcium: 9.8 mg/dL (ref 8.9–10.3)
Chloride: 97 mmol/L — ABNORMAL LOW (ref 98–111)
Creatinine, Ser: 1.38 mg/dL — ABNORMAL HIGH (ref 0.44–1.00)
GFR calc Af Amer: 39 mL/min — ABNORMAL LOW (ref >60)
GFR calc non Af Amer: 34 mL/min — ABNORMAL LOW (ref >60)
Glucose, Bld: 130 mg/dL — ABNORMAL HIGH (ref 70–99)
Potassium: 3.8 mmol/L (ref 3.5–5.1)
Sodium: 135 mmol/L (ref 135–145)

## 2020-01-29 LAB — CBC
HCT: 36.9 % (ref 36.0–46.0)
Hemoglobin: 11.1 g/dL — ABNORMAL LOW (ref 12.0–15.0)
MCH: 23.8 pg — ABNORMAL LOW (ref 26.0–34.0)
MCHC: 30.1 g/dL (ref 30.0–36.0)
MCV: 79 fL — ABNORMAL LOW (ref 80.0–100.0)
Platelets: 357 10*3/uL (ref 150–400)
RBC: 4.67 MIL/uL (ref 3.87–5.11)
RDW: 17.5 % — ABNORMAL HIGH (ref 11.5–15.5)
WBC: 18.9 10*3/uL — ABNORMAL HIGH (ref 4.0–10.5)
nRBC: 0 % (ref 0.0–0.2)

## 2020-01-29 LAB — TROPONIN I (HIGH SENSITIVITY)
Troponin I (High Sensitivity): 7 ng/L (ref <18)
Troponin I (High Sensitivity): 7 ng/L (ref <18)

## 2020-01-29 NOTE — ED Triage Notes (Signed)
The pt arrived by [facility] from home  Chest pain earlier tonight  The pts husband gave her tylenol  1000mg  and by the time ems arrived the pt had no pain  None now  Ems gave 324mg  of aspirin  Nasal 02 for transport

## 2020-01-29 NOTE — ED Notes (Signed)
Pt had a family member come and pick her up. They stated that they have family members in the medical professions and felt it was time for them to go home.

## 2021-11-01 ENCOUNTER — Observation Stay (HOSPITAL_COMMUNITY)
Admission: EM | Admit: 2021-11-01 | Discharge: 2021-11-04 | Disposition: A | Discharge status: Home / Self Care | Payer: Medicare Other | Attending: Internal Medicine | Admitting: Internal Medicine

## 2021-11-01 ENCOUNTER — Encounter (HOSPITAL_COMMUNITY): Payer: Self-pay

## 2021-11-01 DIAGNOSIS — J45909 Unspecified asthma, uncomplicated: Secondary | ICD-10-CM | POA: Insufficient documentation

## 2021-11-01 DIAGNOSIS — R739 Hyperglycemia, unspecified: Secondary | ICD-10-CM | POA: Diagnosis not present

## 2021-11-01 DIAGNOSIS — K625 Hemorrhage of anus and rectum: Secondary | ICD-10-CM

## 2021-11-01 DIAGNOSIS — Z96653 Presence of artificial knee joint, bilateral: Secondary | ICD-10-CM | POA: Insufficient documentation

## 2021-11-01 DIAGNOSIS — E039 Hypothyroidism, unspecified: Secondary | ICD-10-CM | POA: Diagnosis not present

## 2021-11-01 DIAGNOSIS — F32A Depression, unspecified: Secondary | ICD-10-CM

## 2021-11-01 DIAGNOSIS — J9601 Acute respiratory failure with hypoxia: Secondary | ICD-10-CM | POA: Insufficient documentation

## 2021-11-01 DIAGNOSIS — K922 Gastrointestinal hemorrhage, unspecified: Secondary | ICD-10-CM | POA: Diagnosis not present

## 2021-11-01 DIAGNOSIS — Z79899 Other long term (current) drug therapy: Secondary | ICD-10-CM | POA: Diagnosis not present

## 2021-11-01 DIAGNOSIS — I10 Essential (primary) hypertension: Secondary | ICD-10-CM

## 2021-11-01 DIAGNOSIS — F0280 Dementia in other diseases classified elsewhere without behavioral disturbance: Secondary | ICD-10-CM | POA: Diagnosis present

## 2021-11-01 DIAGNOSIS — D72829 Elevated white blood cell count, unspecified: Secondary | ICD-10-CM

## 2021-11-01 DIAGNOSIS — F0390 Unspecified dementia without behavioral disturbance: Secondary | ICD-10-CM | POA: Insufficient documentation

## 2021-11-01 DIAGNOSIS — N189 Chronic kidney disease, unspecified: Secondary | ICD-10-CM | POA: Insufficient documentation

## 2021-11-01 DIAGNOSIS — K219 Gastro-esophageal reflux disease without esophagitis: Secondary | ICD-10-CM

## 2021-11-01 DIAGNOSIS — R6510 Systemic inflammatory response syndrome (SIRS) of non-infectious origin without acute organ dysfunction: Secondary | ICD-10-CM | POA: Diagnosis not present

## 2021-11-01 DIAGNOSIS — I129 Hypertensive chronic kidney disease with stage 1 through stage 4 chronic kidney disease, or unspecified chronic kidney disease: Secondary | ICD-10-CM | POA: Diagnosis not present

## 2021-11-01 DIAGNOSIS — K5190 Ulcerative colitis, unspecified, without complications: Secondary | ICD-10-CM | POA: Diagnosis present

## 2021-11-01 DIAGNOSIS — E2740 Unspecified adrenocortical insufficiency: Secondary | ICD-10-CM | POA: Diagnosis present

## 2021-11-01 LAB — CBC WITH DIFFERENTIAL/PLATELET
Abs Immature Granulocytes: 0.09 10*3/uL — ABNORMAL HIGH (ref 0.00–0.07)
Basophils Absolute: 0 10*3/uL (ref 0.0–0.1)
Basophils Relative: 0 %
Eosinophils Absolute: 0.1 10*3/uL (ref 0.0–0.5)
Eosinophils Relative: 0 %
HCT: 36.8 % (ref 36.0–46.0)
Hemoglobin: 11.9 g/dL — ABNORMAL LOW (ref 12.0–15.0)
Immature Granulocytes: 1 %
Lymphocytes Relative: 7 %
Lymphs Abs: 1 10*3/uL (ref 0.7–4.0)
MCH: 29 pg (ref 26.0–34.0)
MCHC: 32.3 g/dL (ref 30.0–36.0)
MCV: 89.8 fL (ref 80.0–100.0)
Monocytes Absolute: 0.7 10*3/uL (ref 0.1–1.0)
Monocytes Relative: 5 %
Neutro Abs: 11.6 10*3/uL — ABNORMAL HIGH (ref 1.7–7.7)
Neutrophils Relative %: 87 %
Platelets: 265 10*3/uL (ref 150–400)
RBC: 4.1 MIL/uL (ref 3.87–5.11)
RDW: 14.8 % (ref 11.5–15.5)
WBC: 13.5 10*3/uL — ABNORMAL HIGH (ref 4.0–10.5)
nRBC: 0 % (ref 0.0–0.2)

## 2021-11-01 LAB — COMPREHENSIVE METABOLIC PANEL
ALT: 36 U/L (ref 0–44)
AST: 32 U/L (ref 15–41)
Albumin: 3.3 g/dL — ABNORMAL LOW (ref 3.5–5.0)
Alkaline Phosphatase: 39 U/L (ref 38–126)
Anion gap: 9 (ref 5–15)
BUN: 18 mg/dL (ref 8–23)
CO2: 25 mmol/L (ref 22–32)
Calcium: 9.4 mg/dL (ref 8.9–10.3)
Chloride: 104 mmol/L (ref 98–111)
Creatinine, Ser: 0.97 mg/dL (ref 0.44–1.00)
GFR, Estimated: 56 mL/min — ABNORMAL LOW (ref >60)
Glucose, Bld: 197 mg/dL — ABNORMAL HIGH (ref 70–99)
Potassium: 4.2 mmol/L (ref 3.5–5.1)
Sodium: 138 mmol/L (ref 135–145)
Total Bilirubin: 0.5 mg/dL (ref 0.3–1.2)
Total Protein: 7.1 g/dL (ref 6.5–8.1)

## 2021-11-01 LAB — HEMOGLOBIN A1C
Hgb A1c MFr Bld: 6.9 % — ABNORMAL HIGH (ref 4.8–5.6)
Mean Plasma Glucose: 151.33 mg/dL

## 2021-11-01 LAB — TYPE AND SCREEN
ABO/RH(D): A POS
Antibody Screen: NEGATIVE

## 2021-11-01 LAB — PROTIME-INR
INR: 1 (ref 0.8–1.2)
Prothrombin Time: 13.4 seconds (ref 11.4–15.2)

## 2021-11-01 LAB — POC OCCULT BLOOD, ED: Fecal Occult Bld: POSITIVE — AB

## 2021-11-01 MED ORDER — SALINE SPRAY 0.65 % NA SOLN
1.0000 | Freq: Two times a day (BID) | NASAL | Status: DC | PRN
Start: 2021-11-01 — End: 2021-11-04

## 2021-11-01 MED ORDER — MEMANTINE HCL 10 MG PO TABS
5.0000 mg | ORAL_TABLET | Freq: Two times a day (BID) | ORAL | Status: DC
  Administered 2021-11-01 – 2021-11-04 (×6): 5 mg via ORAL
  Filled 2021-11-01 (×6): qty 1

## 2021-11-01 MED ORDER — BION TEARS 0.1-0.3 % OP SOLN: 1.0000 [drp] | Freq: Every morning | OPHTHALMIC | Status: DC

## 2021-11-01 MED ORDER — SODIUM CHLORIDE 0.9 % IV SOLN: Freq: Once | INTRAVENOUS | Status: AC

## 2021-11-01 MED ORDER — ACETAMINOPHEN 650 MG RE SUPP: 650.0000 mg | Freq: Four times a day (QID) | RECTAL | Status: DC | PRN

## 2021-11-01 MED ORDER — AMLODIPINE BESYLATE 5 MG PO TABS
2.5000 mg | ORAL_TABLET | Freq: Every day | ORAL | Status: DC
  Administered 2021-11-02 – 2021-11-04 (×3): 2.5 mg via ORAL
  Filled 2021-11-01 (×3): qty 1

## 2021-11-01 MED ORDER — PANTOPRAZOLE SODIUM 40 MG PO TBEC
40.0000 mg | DELAYED_RELEASE_TABLET | Freq: Every day | ORAL | Status: DC
  Administered 2021-11-02 – 2021-11-04 (×3): 40 mg via ORAL
  Filled 2021-11-01 (×3): qty 1

## 2021-11-01 MED ORDER — ALBUTEROL SULFATE (2.5 MG/3ML) 0.083% IN NEBU
2.5000 mg | INHALATION_SOLUTION | Freq: Two times a day (BID) | RESPIRATORY_TRACT | Status: DC
  Administered 2021-11-01 – 2021-11-04 (×6): 2.5 mg via RESPIRATORY_TRACT
  Filled 2021-11-01 (×6): qty 3

## 2021-11-01 MED ORDER — POLYETHYLENE GLYCOL 3350 17 G PO PACK
17.0000 g | PACK | Freq: Every day | ORAL | Status: DC
  Administered 2021-11-02 – 2021-11-04 (×3): 17 g via ORAL
  Filled 2021-11-01 (×3): qty 1

## 2021-11-01 MED ORDER — OXYBUTYNIN CHLORIDE ER 5 MG PO TB24
10.0000 mg | ORAL_TABLET | Freq: Every day | ORAL | Status: DC
  Administered 2021-11-02 – 2021-11-04 (×3): 10 mg via ORAL
  Filled 2021-11-01 (×3): qty 2

## 2021-11-01 MED ORDER — MESALAMINE 1.2 G PO TBEC
1.2000 g | DELAYED_RELEASE_TABLET | Freq: Every day | ORAL | Status: DC
  Administered 2021-11-02 – 2021-11-04 (×3): 1.2 g via ORAL
  Filled 2021-11-01 (×3): qty 1

## 2021-11-01 MED ORDER — LEVOTHYROXINE SODIUM 50 MCG PO TABS
75.0000 ug | ORAL_TABLET | Freq: Every day | ORAL | Status: DC
  Administered 2021-11-02 – 2021-11-04 (×3): 75 ug via ORAL
  Filled 2021-11-01 (×3): qty 1

## 2021-11-01 MED ORDER — POLYVINYL ALCOHOL 1.4 % OP SOLN
1.0000 [drp] | Freq: Every day | OPHTHALMIC | Status: DC
  Administered 2021-11-02 – 2021-11-04 (×3): 1 [drp] via OPHTHALMIC
  Filled 2021-11-01: qty 15

## 2021-11-01 MED ORDER — ESCITALOPRAM OXALATE 20 MG PO TABS
20.0000 mg | ORAL_TABLET | Freq: Every day | ORAL | Status: DC
  Administered 2021-11-02 – 2021-11-04 (×3): 20 mg via ORAL
  Filled 2021-11-01 (×3): qty 1

## 2021-11-01 MED ORDER — ACETAMINOPHEN 325 MG PO TABS: 650.0000 mg | ORAL_TABLET | Freq: Four times a day (QID) | ORAL | Status: DC | PRN

## 2021-11-01 MED ORDER — PREDNISONE 5 MG PO TABS
5.0000 mg | ORAL_TABLET | Freq: Every day | ORAL | Status: DC
Start: 2021-11-02 — End: 2021-11-04
  Administered 2021-11-02 – 2021-11-04 (×3): 5 mg via ORAL
  Filled 2021-11-01 (×3): qty 1

## 2021-11-02 DIAGNOSIS — E2740 Unspecified adrenocortical insufficiency: Secondary | ICD-10-CM | POA: Diagnosis not present

## 2021-11-02 DIAGNOSIS — E890 Postprocedural hypothyroidism: Secondary | ICD-10-CM

## 2021-11-02 DIAGNOSIS — K922 Gastrointestinal hemorrhage, unspecified: Secondary | ICD-10-CM | POA: Diagnosis not present

## 2021-11-02 LAB — URINALYSIS, COMPLETE (UACMP) WITH MICROSCOPIC
Bilirubin Urine: NEGATIVE
Glucose, UA: NEGATIVE mg/dL
Ketones, ur: NEGATIVE mg/dL
Nitrite: NEGATIVE
Protein, ur: NEGATIVE mg/dL
Specific Gravity, Urine: 1.008 (ref 1.005–1.030)
WBC, UA: >50 WBC/hpf — ABNORMAL HIGH (ref 0–5)
pH: 6 (ref 5.0–8.0)

## 2021-11-02 LAB — COMPREHENSIVE METABOLIC PANEL
ALT: 32 U/L (ref 0–44)
AST: 25 U/L (ref 15–41)
Albumin: 3.1 g/dL — ABNORMAL LOW (ref 3.5–5.0)
Alkaline Phosphatase: 36 U/L — ABNORMAL LOW (ref 38–126)
Anion gap: 7 (ref 5–15)
BUN: 11 mg/dL (ref 8–23)
CO2: 28 mmol/L (ref 22–32)
Calcium: 9 mg/dL (ref 8.9–10.3)
Chloride: 105 mmol/L (ref 98–111)
Creatinine, Ser: 0.87 mg/dL (ref 0.44–1.00)
GFR, Estimated: >60 mL/min (ref >60)
Glucose, Bld: 131 mg/dL — ABNORMAL HIGH (ref 70–99)
Potassium: 3.8 mmol/L (ref 3.5–5.1)
Sodium: 140 mmol/L (ref 135–145)
Total Bilirubin: 0.5 mg/dL (ref 0.3–1.2)
Total Protein: 6.6 g/dL (ref 6.5–8.1)

## 2021-11-02 LAB — CBC
HCT: 35.5 % — ABNORMAL LOW (ref 36.0–46.0)
Hemoglobin: 11.3 g/dL — ABNORMAL LOW (ref 12.0–15.0)
MCH: 28.4 pg (ref 26.0–34.0)
MCHC: 31.8 g/dL (ref 30.0–36.0)
MCV: 89.2 fL (ref 80.0–100.0)
Platelets: 253 10*3/uL (ref 150–400)
RBC: 3.98 MIL/uL (ref 3.87–5.11)
RDW: 14.6 % (ref 11.5–15.5)
WBC: 11.3 10*3/uL — ABNORMAL HIGH (ref 4.0–10.5)
nRBC: 0 % (ref 0.0–0.2)

## 2021-11-02 LAB — HEMOGLOBIN AND HEMATOCRIT, BLOOD
HCT: 34.5 % — ABNORMAL LOW (ref 36.0–46.0)
Hemoglobin: 11 g/dL — ABNORMAL LOW (ref 12.0–15.0)

## 2021-11-03 ENCOUNTER — Encounter (HOSPITAL_COMMUNITY): Payer: Self-pay | Admitting: Internal Medicine

## 2021-11-03 DIAGNOSIS — E2740 Unspecified adrenocortical insufficiency: Secondary | ICD-10-CM | POA: Diagnosis not present

## 2021-11-03 DIAGNOSIS — K922 Gastrointestinal hemorrhage, unspecified: Secondary | ICD-10-CM | POA: Diagnosis not present

## 2021-11-03 DIAGNOSIS — E890 Postprocedural hypothyroidism: Secondary | ICD-10-CM | POA: Diagnosis not present

## 2021-11-03 LAB — HEMOGLOBIN AND HEMATOCRIT, BLOOD
HCT: 35 % — ABNORMAL LOW (ref 36.0–46.0)
HCT: 34.7 % — ABNORMAL LOW (ref 36.0–46.0)
Hemoglobin: 11.3 g/dL — ABNORMAL LOW (ref 12.0–15.0)
Hemoglobin: 11.1 g/dL — ABNORMAL LOW (ref 12.0–15.0)

## 2021-11-03 MED ORDER — ENSURE ENLIVE PO LIQD
237.0000 mL | Freq: Two times a day (BID) | ORAL | Status: DC
  Administered 2021-11-04 (×2): 237 mL via ORAL

## 2021-11-04 DIAGNOSIS — F0390 Unspecified dementia without behavioral disturbance: Secondary | ICD-10-CM

## 2021-11-04 DIAGNOSIS — K922 Gastrointestinal hemorrhage, unspecified: Secondary | ICD-10-CM | POA: Diagnosis not present

## 2021-11-04 DIAGNOSIS — E2740 Unspecified adrenocortical insufficiency: Secondary | ICD-10-CM | POA: Diagnosis not present

## 2021-11-04 LAB — HEMOGLOBIN AND HEMATOCRIT, BLOOD
HCT: 34.5 % — ABNORMAL LOW (ref 36.0–46.0)
Hemoglobin: 11.1 g/dL — ABNORMAL LOW (ref 12.0–15.0)

## 2021-11-20 ENCOUNTER — Encounter (HOSPITAL_COMMUNITY): Payer: Self-pay

## 2021-11-20 ENCOUNTER — Emergency Department (HOSPITAL_COMMUNITY)
Admission: EM | Admit: 2021-11-20 | Discharge: 2021-11-20 | Disposition: A | Discharge status: Home / Self Care | Payer: Medicare Other | Attending: Emergency Medicine | Admitting: Emergency Medicine

## 2021-11-20 ENCOUNTER — Emergency Department (HOSPITAL_COMMUNITY): Payer: Medicare Other

## 2021-11-20 ENCOUNTER — Other Ambulatory Visit: Payer: Self-pay

## 2021-11-20 DIAGNOSIS — R7989 Other specified abnormal findings of blood chemistry: Secondary | ICD-10-CM | POA: Insufficient documentation

## 2021-11-20 DIAGNOSIS — K921 Melena: Secondary | ICD-10-CM | POA: Diagnosis present

## 2021-11-20 DIAGNOSIS — K625 Hemorrhage of anus and rectum: Secondary | ICD-10-CM

## 2021-11-20 DIAGNOSIS — K5790 Diverticulosis of intestine, part unspecified, without perforation or abscess without bleeding: Secondary | ICD-10-CM

## 2021-11-20 DIAGNOSIS — N390 Urinary tract infection, site not specified: Secondary | ICD-10-CM

## 2021-11-20 DIAGNOSIS — F0390 Unspecified dementia without behavioral disturbance: Secondary | ICD-10-CM | POA: Insufficient documentation

## 2021-11-20 LAB — CBC WITH DIFFERENTIAL/PLATELET
Abs Immature Granulocytes: 0.11 10*3/uL — ABNORMAL HIGH (ref 0.00–0.07)
Basophils Absolute: 0.1 10*3/uL (ref 0.0–0.1)
Basophils Relative: 0 %
Eosinophils Absolute: 0.3 10*3/uL (ref 0.0–0.5)
Eosinophils Relative: 2 %
HCT: 34.9 % — ABNORMAL LOW (ref 36.0–46.0)
Hemoglobin: 11.1 g/dL — ABNORMAL LOW (ref 12.0–15.0)
Immature Granulocytes: 1 %
Lymphocytes Relative: 13 %
Lymphs Abs: 1.5 10*3/uL (ref 0.7–4.0)
MCH: 28.8 pg (ref 26.0–34.0)
MCHC: 31.8 g/dL (ref 30.0–36.0)
MCV: 90.4 fL (ref 80.0–100.0)
Monocytes Absolute: 1.1 10*3/uL — ABNORMAL HIGH (ref 0.1–1.0)
Monocytes Relative: 9 %
Neutro Abs: 8.7 10*3/uL — ABNORMAL HIGH (ref 1.7–7.7)
Neutrophils Relative %: 75 %
Platelets: 248 10*3/uL (ref 150–400)
RBC: 3.86 MIL/uL — ABNORMAL LOW (ref 3.87–5.11)
RDW: 15.3 % (ref 11.5–15.5)
WBC: 11.6 10*3/uL — ABNORMAL HIGH (ref 4.0–10.5)
nRBC: 0 % (ref 0.0–0.2)

## 2021-11-20 LAB — COMPREHENSIVE METABOLIC PANEL
ALT: 28 U/L (ref 0–44)
AST: 24 U/L (ref 15–41)
Albumin: 3.4 g/dL — ABNORMAL LOW (ref 3.5–5.0)
Alkaline Phosphatase: 38 U/L (ref 38–126)
Anion gap: 7 (ref 5–15)
BUN: 13 mg/dL (ref 8–23)
CO2: 26 mmol/L (ref 22–32)
Calcium: 9 mg/dL (ref 8.9–10.3)
Chloride: 105 mmol/L (ref 98–111)
Creatinine, Ser: 0.79 mg/dL (ref 0.44–1.00)
GFR, Estimated: >60 mL/min (ref >60)
Glucose, Bld: 113 mg/dL — ABNORMAL HIGH (ref 70–99)
Potassium: 3.6 mmol/L (ref 3.5–5.1)
Sodium: 138 mmol/L (ref 135–145)
Total Bilirubin: 0.5 mg/dL (ref 0.3–1.2)
Total Protein: 7.1 g/dL (ref 6.5–8.1)

## 2021-11-20 LAB — URINALYSIS, ROUTINE W REFLEX MICROSCOPIC
Bilirubin Urine: NEGATIVE
Glucose, UA: NEGATIVE mg/dL
Ketones, ur: NEGATIVE mg/dL
Nitrite: NEGATIVE
Protein, ur: NEGATIVE mg/dL
Specific Gravity, Urine: 1.012 (ref 1.005–1.030)
WBC, UA: >50 WBC/hpf — ABNORMAL HIGH (ref 0–5)
pH: 7 (ref 5.0–8.0)

## 2021-11-20 LAB — TYPE AND SCREEN
ABO/RH(D): A POS
Antibody Screen: NEGATIVE

## 2021-11-20 MED ORDER — LACTATED RINGERS IV SOLN: INTRAVENOUS | Status: DC

## 2021-11-20 MED ORDER — CEFDINIR 300 MG PO CAPS: 300.0000 mg | ORAL_CAPSULE | Freq: Two times a day (BID) | ORAL | 0 refills | Status: AC

## 2021-11-20 MED ORDER — IOHEXOL 300 MG/ML  SOLN
100.0000 mL | Freq: Once | INTRAMUSCULAR | Status: AC | PRN
  Administered 2021-11-20: 100 mL via INTRAVENOUS

## 2021-11-20 MED ORDER — SODIUM CHLORIDE (PF) 0.9 % IJ SOLN
INTRAMUSCULAR | Status: AC
  Filled 2021-11-20: qty 50

## 2021-11-20 NOTE — ED Triage Notes (Signed)
EMS reports from [facility] care. Hx of diverticulitis with blood clots a month ago, states same symptoms again.  BP 146/76 HR 78 RR 16 Sp02 92 RA CBG 125

## 2021-11-20 NOTE — ED Notes (Addendum)
Clicked off in and out by accident

## 2021-11-20 NOTE — ED Provider Notes (Addendum)
[facility] DEPT Provider Note   CSN: [id number] Arrival date & time: 11/20/21  2831     History  No chief complaint on file.   SHRITHA BRESEE is a 88 y.o. female.  88 year old female presents with blood in stool similar to her prior diverticular disease.  She denies any fever, abdominal pain at this time.  She does have history of dementia.  She does not take any blood thinners.  EMS was called and patient's vital signs are stable.  Transported for further evaluation       Home Medications Prior to Admission medications   Medication Sig Start Date End Date Taking? Authorizing Provider  acetaminophen (TYLENOL) 325 MG tablet Take 650 mg by mouth every 8 (eight) hours as needed for moderate pain.    [provider]  albuterol (PROVENTIL HFA) 108 (90 Base) MCG/ACT inhaler Inhale 2 puffs into the lungs in the morning and at bedtime.    [provider]  amLODipine (NORVASC) 2.5 MG tablet Take 2.5 mg by mouth daily.    [provider]  Artificial Tear Solution (BION TEARS) 0.1-0.3 % SOLN Place 1 drop into both eyes every morning.    [provider]  Calcium Carbonate-Vitamin D3 (CALCIUM 600-D) 600-400 MG-UNIT TABS Take 1 tablet by mouth daily.    [provider]  docusate sodium (COLACE) 100 MG capsule Take 100 mg by mouth 2 (two) times daily as needed for mild constipation.    [provider]  escitalopram (LEXAPRO) 20 MG tablet Take 20 mg by mouth daily.    [provider]  ferrous sulfate 325 (65 FE) MG tablet Take 325 mg by mouth daily with breakfast.    [provider]  guaiFENesin (MUCINEX) 600 MG 12 hr tablet Take 600 mg by mouth in the morning.    [provider]  lactobacillus acidophilus (BACID) TABS tablet Take 1 tablet by mouth daily.    [provider]  levothyroxine (SYNTHROID) 75 MCG tablet Take 75 mcg by mouth daily. 12/03/17   [provider]   memantine (NAMENDA) 5 MG tablet Take 5 mg by mouth 2 (two) times daily.    [provider]  mesalamine (LIALDA) 1.2 g EC tablet Take 1.2 g by mouth daily with breakfast.    [provider]  omeprazole (PRILOSEC) 20 MG capsule Take 20 mg by mouth daily.    [provider]  oxybutynin (DITROPAN-XL) 10 MG 24 hr tablet Take 10 mg by mouth daily.    [provider]  polyethylene glycol (MIRALAX / GLYCOLAX) 17 g packet Take 17 g by mouth daily.    [provider]  predniSONE (DELTASONE) 5 MG tablet Take 5 mg by mouth daily with breakfast.    [provider]  sodium chloride (OCEAN) 0.65 % SOLN nasal spray Place 1 spray into both nostrils 2 (two) times daily as needed for congestion.    [provider]  Alum & Mag Hydroxide-Simeth (MAGIC MOUTHWASH) SOLN Take 5 mLs by mouth 3 (three) times daily as needed. Thrush   07/21/11  [provider]      Allergies    Sulfamethoxazole, Tape, Biaxin [clarithromycin], Ciprofloxacin, Codeine, Dilantin [phenytoin], Doxycycline, Lactose intolerance (gi), Restasis [cyclosporine], and Sulfonamide derivatives    Review of Systems   Review of Systems  All other systems reviewed and are negative.   Physical Exam Updated Vital Signs There were no vitals taken for this visit. Physical Exam Vitals and nursing note  reviewed.  Constitutional:      General: She is not in acute distress.    Appearance: Normal appearance. She is well-developed. She is not toxic-appearing.  HENT:     Head: Normocephalic and atraumatic.  Eyes:     General: Lids are normal.     Conjunctiva/sclera: Conjunctivae normal.     Pupils: Pupils are equal, round, and reactive to light.  Neck:     Thyroid: No thyroid mass.     Trachea: No tracheal deviation.  Cardiovascular:     Rate and Rhythm: Normal rate and regular rhythm.     Heart sounds: Normal heart sounds. No murmur heard.    No gallop.  Pulmonary:     Effort:  Pulmonary effort is normal. No respiratory distress.     Breath sounds: Normal breath sounds. No stridor. No decreased breath sounds, wheezing, rhonchi or rales.  Abdominal:     General: There is no distension.     Palpations: Abdomen is soft.     Tenderness: There is no abdominal tenderness. There is no rebound.  Genitourinary:    Comments: Gross blood noted on digital exam Musculoskeletal:        General: No tenderness. Normal range of motion.     Cervical back: Normal range of motion and neck supple.  Skin:    General: Skin is warm and dry.     Findings: No abrasion or rash.  Neurological:     Mental Status: She is alert and oriented to person, place, and time. Mental status is at baseline.     GCS: GCS eye subscore is 4. GCS verbal subscore is 5. GCS motor subscore is 6.     Cranial Nerves: No cranial nerve deficit.     Sensory: No sensory deficit.     Motor: Motor function is intact.  Psychiatric:        Attention and Perception: Attention normal.        Speech: Speech normal.        Behavior: Behavior normal.     ED Results / Procedures / Treatments   Labs (all labs ordered are listed, but only abnormal results are displayed) Labs Reviewed  CBC WITH DIFFERENTIAL/PLATELET  COMPREHENSIVE METABOLIC PANEL  TYPE AND SCREEN    EKG None  Radiology No results found.  Procedures Procedures    Medications Ordered in ED Medications  lactated ringers infusion (has no administration in time range)    ED Course/ Medical Decision Making/ A&P                           Medical Decision Making Amount and/or Complexity of Data Reviewed Labs: ordered. Radiology: ordered.  Risk Prescription drug management.   Patient here with concern for rectal bleeding.  She does have a history of diverticulosis and this was confirmed by her abdominal CT which I reviewed and interpreted.  She had no signs of diverticulitis.  Her hemoglobin here is 11.1 and is stable.  Her blood  pressure has also been stable.  She has an elevated BUN and creatinine therefore feel that GI bleeding is less unlikely.  Suspected diverticulosis is a cause of her current rectal bleeding.  Patient also has evidence of UTI on her urinalysis and will place on antibiotics and sent back to her facility        Final Clinical Impression(s) / ED Diagnoses Final diagnoses:  None    Rx / DC Orders ED Discharge Orders  None         Lacretia Leigh, MD 11/20/21 1447    Lacretia Leigh, MD 11/20/21 [phone]

## 2021-12-13 ENCOUNTER — Encounter (HOSPITAL_COMMUNITY): Payer: Self-pay

## 2021-12-13 ENCOUNTER — Emergency Department (HOSPITAL_COMMUNITY): Payer: Medicare Other

## 2021-12-13 ENCOUNTER — Observation Stay (HOSPITAL_COMMUNITY): Payer: Medicare Other

## 2021-12-13 ENCOUNTER — Inpatient Hospital Stay (HOSPITAL_COMMUNITY)
Admission: EM | Admit: 2021-12-13 | Discharge: 2021-12-16 | DRG: 065 | Disposition: A | Discharge status: Skilled Nursing Facility | Payer: Medicare Other | Attending: Internal Medicine | Admitting: Internal Medicine

## 2021-12-13 ENCOUNTER — Other Ambulatory Visit: Payer: Self-pay

## 2021-12-13 DIAGNOSIS — K219 Gastro-esophageal reflux disease without esophagitis: Secondary | ICD-10-CM | POA: Diagnosis present

## 2021-12-13 DIAGNOSIS — F0283 Dementia in other diseases classified elsewhere, unspecified severity, with mood disturbance: Secondary | ICD-10-CM | POA: Diagnosis present

## 2021-12-13 DIAGNOSIS — R29810 Facial weakness: Secondary | ICD-10-CM | POA: Diagnosis present

## 2021-12-13 DIAGNOSIS — I6389 Other cerebral infarction: Secondary | ICD-10-CM | POA: Diagnosis not present

## 2021-12-13 DIAGNOSIS — Z888 Allergy status to other drugs, medicaments and biological substances status: Secondary | ICD-10-CM

## 2021-12-13 DIAGNOSIS — Z881 Allergy status to other antibiotic agents status: Secondary | ICD-10-CM

## 2021-12-13 DIAGNOSIS — F32A Depression, unspecified: Secondary | ICD-10-CM | POA: Diagnosis present

## 2021-12-13 DIAGNOSIS — E2740 Unspecified adrenocortical insufficiency: Secondary | ICD-10-CM | POA: Diagnosis present

## 2021-12-13 DIAGNOSIS — Z7982 Long term (current) use of aspirin: Secondary | ICD-10-CM

## 2021-12-13 DIAGNOSIS — G459 Transient cerebral ischemic attack, unspecified: Principal | ICD-10-CM

## 2021-12-13 DIAGNOSIS — J312 Chronic pharyngitis: Secondary | ICD-10-CM | POA: Diagnosis present

## 2021-12-13 DIAGNOSIS — E039 Hypothyroidism, unspecified: Secondary | ICD-10-CM | POA: Diagnosis present

## 2021-12-13 DIAGNOSIS — I639 Cerebral infarction, unspecified: Secondary | ICD-10-CM

## 2021-12-13 DIAGNOSIS — J45909 Unspecified asthma, uncomplicated: Secondary | ICD-10-CM | POA: Diagnosis present

## 2021-12-13 DIAGNOSIS — Z9049 Acquired absence of other specified parts of digestive tract: Secondary | ICD-10-CM

## 2021-12-13 DIAGNOSIS — D649 Anemia, unspecified: Secondary | ICD-10-CM | POA: Diagnosis present

## 2021-12-13 DIAGNOSIS — E890 Postprocedural hypothyroidism: Secondary | ICD-10-CM | POA: Diagnosis present

## 2021-12-13 DIAGNOSIS — R531 Weakness: Secondary | ICD-10-CM

## 2021-12-13 DIAGNOSIS — I6381 Other cerebral infarction due to occlusion or stenosis of small artery: Principal | ICD-10-CM | POA: Diagnosis present

## 2021-12-13 DIAGNOSIS — Z882 Allergy status to sulfonamides status: Secondary | ICD-10-CM

## 2021-12-13 DIAGNOSIS — Z8249 Family history of ischemic heart disease and other diseases of the circulatory system: Secondary | ICD-10-CM

## 2021-12-13 DIAGNOSIS — K5190 Ulcerative colitis, unspecified, without complications: Secondary | ICD-10-CM | POA: Diagnosis present

## 2021-12-13 DIAGNOSIS — R29701 NIHSS score 1: Secondary | ICD-10-CM | POA: Diagnosis present

## 2021-12-13 DIAGNOSIS — F0390 Unspecified dementia without behavioral disturbance: Secondary | ICD-10-CM

## 2021-12-13 DIAGNOSIS — Z90710 Acquired absence of both cervix and uterus: Secondary | ICD-10-CM

## 2021-12-13 DIAGNOSIS — Z7189 Other specified counseling: Secondary | ICD-10-CM

## 2021-12-13 DIAGNOSIS — I63312 Cerebral infarction due to thrombosis of left middle cerebral artery: Secondary | ICD-10-CM

## 2021-12-13 DIAGNOSIS — Z96643 Presence of artificial hip joint, bilateral: Secondary | ICD-10-CM | POA: Diagnosis present

## 2021-12-13 DIAGNOSIS — I69351 Hemiplegia and hemiparesis following cerebral infarction affecting right dominant side: Secondary | ICD-10-CM

## 2021-12-13 DIAGNOSIS — E785 Hyperlipidemia, unspecified: Secondary | ICD-10-CM | POA: Diagnosis present

## 2021-12-13 DIAGNOSIS — Z86018 Personal history of other benign neoplasm: Secondary | ICD-10-CM

## 2021-12-13 DIAGNOSIS — R1310 Dysphagia, unspecified: Secondary | ICD-10-CM | POA: Diagnosis present

## 2021-12-13 DIAGNOSIS — I69391 Dysphagia following cerebral infarction: Secondary | ICD-10-CM

## 2021-12-13 DIAGNOSIS — Z9889 Other specified postprocedural states: Secondary | ICD-10-CM

## 2021-12-13 DIAGNOSIS — I10 Essential (primary) hypertension: Secondary | ICD-10-CM | POA: Diagnosis present

## 2021-12-13 DIAGNOSIS — Z91048 Other nonmedicinal substance allergy status: Secondary | ICD-10-CM

## 2021-12-13 DIAGNOSIS — Z86011 Personal history of benign neoplasm of the brain: Secondary | ICD-10-CM

## 2021-12-13 DIAGNOSIS — Z66 Do not resuscitate: Secondary | ICD-10-CM | POA: Diagnosis present

## 2021-12-13 DIAGNOSIS — Z7952 Long term (current) use of systemic steroids: Secondary | ICD-10-CM

## 2021-12-13 DIAGNOSIS — G309 Alzheimer's disease, unspecified: Secondary | ICD-10-CM | POA: Diagnosis present

## 2021-12-13 DIAGNOSIS — F0280 Dementia in other diseases classified elsewhere without behavioral disturbance: Secondary | ICD-10-CM | POA: Diagnosis present

## 2021-12-13 DIAGNOSIS — Z7902 Long term (current) use of antithrombotics/antiplatelets: Secondary | ICD-10-CM

## 2021-12-13 DIAGNOSIS — I69328 Other speech and language deficits following cerebral infarction: Secondary | ICD-10-CM

## 2021-12-13 DIAGNOSIS — E739 Lactose intolerance, unspecified: Secondary | ICD-10-CM | POA: Diagnosis present

## 2021-12-13 LAB — CBC
HCT: 37.1 % (ref 36.0–46.0)
Hemoglobin: 11.7 g/dL — ABNORMAL LOW (ref 12.0–15.0)
MCH: 28.9 pg (ref 26.0–34.0)
MCHC: 31.5 g/dL (ref 30.0–36.0)
MCV: 91.6 fL (ref 80.0–100.0)
Platelets: 269 10*3/uL (ref 150–400)
RBC: 4.05 MIL/uL (ref 3.87–5.11)
RDW: 14.9 % (ref 11.5–15.5)
WBC: 11.5 10*3/uL — ABNORMAL HIGH (ref 4.0–10.5)
nRBC: 0 % (ref 0.0–0.2)

## 2021-12-13 LAB — DIFFERENTIAL
Abs Immature Granulocytes: 0.15 10*3/uL — ABNORMAL HIGH (ref 0.00–0.07)
Basophils Absolute: 0.1 10*3/uL (ref 0.0–0.1)
Basophils Relative: 1 %
Eosinophils Absolute: 0.2 10*3/uL (ref 0.0–0.5)
Eosinophils Relative: 1 %
Immature Granulocytes: 1 %
Lymphocytes Relative: 13 %
Lymphs Abs: 1.5 10*3/uL (ref 0.7–4.0)
Monocytes Absolute: 1.1 10*3/uL — ABNORMAL HIGH (ref 0.1–1.0)
Monocytes Relative: 9 %
Neutro Abs: 8.6 10*3/uL — ABNORMAL HIGH (ref 1.7–7.7)
Neutrophils Relative %: 75 %

## 2021-12-13 LAB — I-STAT CHEM 8, ED
BUN: 19 mg/dL (ref 8–23)
Calcium, Ion: 1.14 mmol/L — ABNORMAL LOW (ref 1.15–1.40)
Chloride: 101 mmol/L (ref 98–111)
Creatinine, Ser: 0.8 mg/dL (ref 0.44–1.00)
Glucose, Bld: 120 mg/dL — ABNORMAL HIGH (ref 70–99)
HCT: 36 % (ref 36.0–46.0)
Hemoglobin: 12.2 g/dL (ref 12.0–15.0)
Potassium: 3.9 mmol/L (ref 3.5–5.1)
Sodium: 139 mmol/L (ref 135–145)
TCO2: 28 mmol/L (ref 22–32)

## 2021-12-13 LAB — ETHANOL: Alcohol, Ethyl (B): <10 mg/dL (ref <10)

## 2021-12-13 LAB — ECHOCARDIOGRAM COMPLETE
AR max vel: 2.25 cm2
AV Area VTI: 1.91 cm2
AV Area mean vel: 2 cm2
AV Mean grad: 7.5 mmHg
AV Peak grad: 13.8 mmHg
Ao pk vel: 1.86 m/s
Area-P 1/2: 2.46 cm2
Height: 63 in
S' Lateral: 2.4 cm
Weight: 2490.32 oz

## 2021-12-13 LAB — COMPREHENSIVE METABOLIC PANEL
ALT: 34 U/L (ref 0–44)
AST: 29 U/L (ref 15–41)
Albumin: 3.4 g/dL — ABNORMAL LOW (ref 3.5–5.0)
Alkaline Phosphatase: 39 U/L (ref 38–126)
Anion gap: 10 (ref 5–15)
BUN: 13 mg/dL (ref 8–23)
CO2: 25 mmol/L (ref 22–32)
Calcium: 9.1 mg/dL (ref 8.9–10.3)
Chloride: 102 mmol/L (ref 98–111)
Creatinine, Ser: 0.87 mg/dL (ref 0.44–1.00)
GFR, Estimated: >60 mL/min (ref >60)
Glucose, Bld: 121 mg/dL — ABNORMAL HIGH (ref 70–99)
Potassium: 3.5 mmol/L (ref 3.5–5.1)
Sodium: 137 mmol/L (ref 135–145)
Total Bilirubin: 0.6 mg/dL (ref 0.3–1.2)
Total Protein: 7 g/dL (ref 6.5–8.1)

## 2021-12-13 LAB — LIPID PANEL
Cholesterol: 205 mg/dL — ABNORMAL HIGH (ref 0–200)
HDL: 44 mg/dL (ref >40)
LDL Cholesterol: 112 mg/dL — ABNORMAL HIGH (ref 0–99)
Total CHOL/HDL Ratio: 4.7 RATIO
Triglycerides: 245 mg/dL — ABNORMAL HIGH (ref <150)
VLDL: 49 mg/dL — ABNORMAL HIGH (ref 0–40)

## 2021-12-13 LAB — PROTIME-INR
INR: 1.1 (ref 0.8–1.2)
Prothrombin Time: 14.2 seconds (ref 11.4–15.2)

## 2021-12-13 LAB — CBG MONITORING, ED: Glucose-Capillary: 118 mg/dL — ABNORMAL HIGH (ref 70–99)

## 2021-12-13 LAB — TSH: TSH: 3.311 u[IU]/mL (ref 0.350–4.500)

## 2021-12-13 LAB — HEMOGLOBIN AND HEMATOCRIT, BLOOD
HCT: 37.4 % (ref 36.0–46.0)
HCT: 37 % (ref 36.0–46.0)
Hemoglobin: 12.1 g/dL (ref 12.0–15.0)
Hemoglobin: 11.7 g/dL — ABNORMAL LOW (ref 12.0–15.0)

## 2021-12-13 LAB — HEMOGLOBIN A1C
Hgb A1c MFr Bld: 6.4 % — ABNORMAL HIGH (ref 4.8–5.6)
Mean Plasma Glucose: 136.98 mg/dL

## 2021-12-13 LAB — APTT: aPTT: 28 seconds (ref 24–36)

## 2021-12-13 MED ORDER — ACETAMINOPHEN 325 MG PO TABS
650.0000 mg | ORAL_TABLET | Freq: Four times a day (QID) | ORAL | Status: DC | PRN
  Administered 2021-12-13 – 2021-12-15 (×3): 650 mg via ORAL
  Filled 2021-12-13 (×4): qty 2

## 2021-12-13 MED ORDER — OXYBUTYNIN CHLORIDE ER 10 MG PO TB24
10.0000 mg | ORAL_TABLET | Freq: Every day | ORAL | Status: DC
  Administered 2021-12-13 – 2021-12-16 (×4): 10 mg via ORAL
  Filled 2021-12-13 (×4): qty 1

## 2021-12-13 MED ORDER — MEMANTINE HCL 10 MG PO TABS
5.0000 mg | ORAL_TABLET | Freq: Two times a day (BID) | ORAL | Status: DC
  Administered 2021-12-13 – 2021-12-16 (×7): 5 mg via ORAL
  Filled 2021-12-13 (×7): qty 1

## 2021-12-13 MED ORDER — ACETAMINOPHEN 650 MG RE SUPP: 650.0000 mg | Freq: Four times a day (QID) | RECTAL | Status: DC | PRN

## 2021-12-13 MED ORDER — AMLODIPINE BESYLATE 2.5 MG PO TABS
2.5000 mg | ORAL_TABLET | Freq: Every day | ORAL | Status: DC
  Administered 2021-12-13: 2.5 mg via ORAL
  Filled 2021-12-13: qty 1

## 2021-12-13 MED ORDER — IOHEXOL 350 MG/ML SOLN
75.0000 mL | Freq: Once | INTRAVENOUS | Status: AC | PRN
  Administered 2021-12-13: 75 mL via INTRAVENOUS

## 2021-12-13 MED ORDER — FERROUS SULFATE 325 (65 FE) MG PO TABS
325.0000 mg | ORAL_TABLET | Freq: Every day | ORAL | Status: DC
  Administered 2021-12-14 – 2021-12-16 (×3): 325 mg via ORAL
  Filled 2021-12-13 (×3): qty 1

## 2021-12-13 MED ORDER — SODIUM CHLORIDE 0.9% FLUSH
3.0000 mL | Freq: Once | INTRAVENOUS | Status: AC
  Administered 2021-12-13: 3 mL via INTRAVENOUS

## 2021-12-13 MED ORDER — MESALAMINE 1.2 G PO TBEC
1.2000 g | DELAYED_RELEASE_TABLET | Freq: Every day | ORAL | Status: DC
  Administered 2021-12-14 – 2021-12-16 (×3): 1.2 g via ORAL
  Filled 2021-12-13 (×5): qty 1

## 2021-12-13 MED ORDER — ENOXAPARIN SODIUM 40 MG/0.4ML IJ SOSY
40.0000 mg | PREFILLED_SYRINGE | INTRAMUSCULAR | Status: DC
  Administered 2021-12-13 – 2021-12-16 (×4): 40 mg via SUBCUTANEOUS
  Filled 2021-12-13 (×4): qty 0.4

## 2021-12-13 MED ORDER — PREDNISONE 5 MG PO TABS
5.0000 mg | ORAL_TABLET | Freq: Every day | ORAL | Status: DC
  Administered 2021-12-13 – 2021-12-16 (×4): 5 mg via ORAL
  Filled 2021-12-13 (×4): qty 1

## 2021-12-13 MED ORDER — LEVOTHYROXINE SODIUM 75 MCG PO TABS
75.0000 ug | ORAL_TABLET | Freq: Every day | ORAL | Status: DC
Start: 2021-12-13 — End: 2021-12-17
  Administered 2021-12-13 – 2021-12-16 (×4): 75 ug via ORAL
  Filled 2021-12-13 (×4): qty 1

## 2021-12-13 MED ORDER — ASPIRIN 81 MG PO CHEW
81.0000 mg | CHEWABLE_TABLET | Freq: Every day | ORAL | Status: DC
  Administered 2021-12-13 – 2021-12-16 (×4): 81 mg via ORAL
  Filled 2021-12-13 (×4): qty 1

## 2021-12-13 MED ORDER — MOMETASONE FURO-FORMOTEROL FUM 200-5 MCG/ACT IN AERO
2.0000 | INHALATION_SPRAY | Freq: Two times a day (BID) | RESPIRATORY_TRACT | Status: DC
  Administered 2021-12-13 – 2021-12-16 (×7): 2 via RESPIRATORY_TRACT
  Filled 2021-12-13: qty 8.8

## 2021-12-13 MED ORDER — ROSUVASTATIN CALCIUM 5 MG PO TABS
10.0000 mg | ORAL_TABLET | Freq: Every day | ORAL | Status: DC
  Administered 2021-12-13: 10 mg via ORAL
  Filled 2021-12-13: qty 2

## 2021-12-13 MED ORDER — ESCITALOPRAM OXALATE 10 MG PO TABS
20.0000 mg | ORAL_TABLET | Freq: Every day | ORAL | Status: DC
  Administered 2021-12-13 – 2021-12-16 (×4): 20 mg via ORAL
  Filled 2021-12-13 (×4): qty 2

## 2021-12-13 MED ORDER — ALBUTEROL SULFATE (2.5 MG/3ML) 0.083% IN NEBU
2.5000 mg | INHALATION_SOLUTION | RESPIRATORY_TRACT | Status: DC | PRN
Start: 2021-12-13 — End: 2021-12-17
  Filled 2021-12-13: qty 3

## 2021-12-13 MED ORDER — ASPIRIN 300 MG RE SUPP
300.0000 mg | Freq: Every day | RECTAL | Status: DC
  Filled 2021-12-13: qty 1

## 2021-12-13 NOTE — H&P (Signed)
Date: 12/13/2021               Patient Name:  Sierra [name] MRN: [id number]  DOB: [birth year] Age / Sex: 88 y.o., female   PCP: Patient, No Pcp Per         Medical Service: Internal Medicine Teaching Service         Attending Physician: Dr. Velna Ochs, MD    First Contact: Dr. Marlene Lard Nooruddin Pager: [phone]  Second Contact: Dr. Juliene Pina Pager: [phone]       After Hours (After 5p/  First Contact Pager: [phone]  weekends / holidays): Second Contact Pager: [phone]   Chief Complaint: Right sided weakness  History of Present Illness:   Sierra [name] is a 88 year old female with past medical history of advanced Alzheimer disease, depression, hypertension, hypothyroidism, asthma, ulcerative colitis, chronic adrenal insufficiency is on prednisone who presents to the hospital for acute episode of right-sided weakness and facial drooping.  Per notes, her last known well with 3 AM where she was ambulated to the bathroom with assistance.  At 5 AM she was noted to have these neurological deficits.  Patient appears comfortable at bedside.  She is alert and oriented to person and place only.  She does not know the time and the president.  She does not know why she is in the hospital and could not recall what happened this morning.  Otherwise she reports doing well, denies any chest pain, shortness of breath, wheezing, pain anywhere else, issue with bowel movements or urination.  She denies hematochezia or melena.  I spoke with patient's daughter Sierra [name], who was informed by her memory unit that patient had complete right-sided paralysis with facial drooping and unable to speak this morning.  She states that at baseline patient is only oriented to person.  She may be able to recall her daughter or husband's names.  She last seen her 1 month ago when she was doing well.  She has been at [facility] for the last 6 years.  I called the [employer] unit and left a  voicemail to call back  Patient was recently admitted to [facility] in June for lower GI bleed in the setting of ulcerative colitis.  Colonoscopy was not performed due to her advanced age and comorbidities.  Patient was discharged after her hemoglobin was stable.  In the ED, code stroke was called.  CT head was negative for acute finding.  Patient was admitted for work-up of either CVA or TIA.  Meds:  Current Meds  Medication Sig   amLODipine (NORVASC) 2.5 MG tablet Take 2.5 mg by mouth daily.   Artificial Tear Solution (BION TEARS) 0.1-0.3 % SOLN Place 1 drop into both eyes every morning.   Calcium Carbonate-Vitamin D3 (CALCIUM 600-D) 600-400 MG-UNIT TABS Take 1 tablet by mouth daily.   escitalopram (LEXAPRO) 20 MG tablet Take 20 mg by mouth daily.   ferrous sulfate 325 (65 FE) MG tablet Take 325 mg by mouth daily with breakfast.   guaiFENesin (MUCINEX) 600 MG 12 hr tablet Take 600 mg by mouth in the morning.   lactobacillus acidophilus (BACID) TABS tablet Take 1 tablet by mouth 2 (two) times daily. For 10 days starting 11-17-21 After 10 days continue taking 1 tablet daily.   levothyroxine (SYNTHROID) 75 MCG tablet Take 75 mcg by mouth daily.   memantine (NAMENDA) 5 MG tablet Take 5 mg by mouth 2 (two) times daily.   mesalamine (LIALDA) 1.2  g EC tablet Take 1.2 g by mouth daily with breakfast.   omeprazole (PRILOSEC) 20 MG capsule Take 20 mg by mouth daily.   oxybutynin (DITROPAN-XL) 10 MG 24 hr tablet Take 10 mg by mouth daily.   oxymetazoline (AFRIN) 0.05 % nasal spray Place 1 spray into both nostrils 3 (three) times daily as needed (nose bleeds).   polyethylene glycol (MIRALAX / GLYCOLAX) 17 g packet Take 17 g by mouth daily.   predniSONE (DELTASONE) 5 MG tablet Take 5 mg by mouth daily with breakfast.   SYMBICORT 160-4.5 MCG/ACT inhaler Inhale 2 puffs into the lungs 2 (two) times daily.    Allergies: Allergies as of 12/13/2021 - Review Complete 12/13/2021  Allergen Reaction Noted    Aricept [donepezil] Other (See Comments) 11/20/2021   Sulfamethoxazole Nausea And Vomiting 07/24/2016   Tape Other (See Comments) 07/23/2016   Biaxin [clarithromycin] Other (See Comments) 06/20/2013   Ciprofloxacin Rash    Codeine Nausea And Vomiting    Dilantin [phenytoin] Rash    Doxycycline Nausea Only 04/28/2011   Lactose intolerance (gi) Other (See Comments) 08/12/2015   Restasis [cyclosporine] Other (See Comments) and Swelling 07/15/2011   Sulfonamide derivatives Rash    Past Medical History:  Diagnosis Date   Acute asthmatic bronchitis    Acute respiratory failure with hypoxia (HCC) 06/11/2016   Allergic rhinitis    Arthritis    Hx R shoulder bursitis, pain L knee and L hip   Asthmatic bronchitis    ATYPICAL MYCOBACTERIAL INFECTION 05/05/2010   Sputum cx Pos 12/ 2011     Balance problem    SINCE BRAIN TUMOR REMOVED IN 2002-BENIGN TUMOR-PT HAS ADRENAL INSUFFICIENCY AND TAKES DAILY PREDNISONE   Blood transfusion    Cardiac murmur    DOES NOT CAUSE ANY SYMPTOMS   Chronic kidney disease    Chronic pharyngitis    Colonic diverticular abscess 02/01/2017   Diverticulitis, colon    Dizziness    DOE (dyspnea on exertion)    Esophageal reflux    GI bleed 01/06/2013   Hyperlipemia    Hypertension    Hypothyroidism    IBS (irritable bowel syndrome)    Leukocytosis    LGI bleed 01/05/2013   Other specified iron deficiency anemias    PNEUMONIA 06/02/2010   Qualifier: Diagnosis of  By: Annamaria Boots MD, Clinton D    Postop Hyponatremia 07/28/2011   Rectal bleeding 01/03/2014   Recurrent upper respiratory infection (URI) 07/10/2011    ACUTE BRONCHITIS - extra prednisone in addition to the daily prednisone and a Z-Pak   Thrombocytosis    THRUSH 04/30/2009   Qualifier: Diagnosis of  By: Annamaria Boots MD, Clinton D    Ulceration, colon    Ulcerative colitis     Family History:  Family History  Problem Relation Age of Onset   Heart attack Father        deceased     Social History:   -Stay at [employer] memory unit -Denies alcohol, smoking or drug use -PCP: Celso Sickle, MD  Review of Systems: A complete ROS was negative except as per HPI.   Physical Exam: Blood pressure (!) 168/86, pulse 77, temperature (!) 97 F (36.1 C), temperature source Oral, resp. rate (!) 21, height '5\' 3"'$  (1.6 m), weight 70.6 kg, SpO2 92 %. Physical Exam Constitutional:      General: She is not in acute distress.    Appearance: She is not ill-appearing.     Comments: Alert and oriented to person and  place  HENT:     Head: Normocephalic.  Eyes:     General: No scleral icterus.       Right eye: No discharge.        Left eye: No discharge.     Conjunctiva/sclera: Conjunctivae normal.  Cardiovascular:     Rate and Rhythm: Normal rate and regular rhythm.     Pulses: Normal pulses.     Heart sounds: Normal heart sounds. No murmur heard.    Comments: No LE edema Pulmonary:     Effort: Pulmonary effort is normal. No respiratory distress.     Breath sounds: Normal breath sounds. No wheezing.  Abdominal:     General: Bowel sounds are normal. There is distension (Mild distention).     Tenderness: There is no abdominal tenderness. There is no guarding.  Musculoskeletal:     Cervical back: Normal range of motion.  Skin:    General: Skin is warm.     Coloration: Skin is not jaundiced.  Neurological:     Mental Status: She is alert.     Comments: PERRLA EOM intact No facial droop or dysarthria Cranial nerves no deficits 5/5 strength of bilateral UE and LE Sensation intact and symmetrical  Psychiatric:        Mood and Affect: Mood normal.      EKG: personally reviewed my interpretation is sinus rhythm, 2 PACs, no right bundle branch block  Assessment & Plan by Problem: Principal Problem:   Acute right-sided weakness Active Problems:   HTN (hypertension)   Hypothyroidism   Adrenal insufficiency (HCC)   Asthma, chronic   Dementia without behavioral disturbance  ([place])  Sierra [name] is a 88 year old female with past medical history of advanced Alzheimer disease, depression, hypertension, hypothyroidism, asthma, ulcerative colitis, chronic adrenal insufficiency is on prednisone who presents to the hospital for acute episode of right-sided weakness and facial drooping, admitted for work-up of small CVA vs TIA.  Right-sided weakness CVA vs TIA CT head negative.  High suspicion for TIA given complete resolution of her neurological deficits.  Continue the rest of stroke work-up per neurology's recommendation. -Pending A1c, lipid panel, echocardiogram -Pending MRI and CTA head/neck -Antiplatelet with aspirin alone given history of GI bleed -Allow permissive hypertension -Telemetry -PT/OT -Patient passed swallow test.  Regular diet order  Hypertension Hold amlodipine for now  Hypothyroidism -Resume levothyroxine 75 mcg -Check TSH  Chronic adrenal insufficiency -Resume prednisone 5 mg daily  Chronic ulcerative colitis -Resume mesalamine  Normocytic anemia Recent history of lower GI bleed Hemoglobin is stable.  Patient denies signs of bleeding -Resume iron supplement -CBC in a.m.  Chronic stable asthma -Resume ICS-LABA -Albuterol as needed  Depression -Resume Lexapro  Advanced Alzheimer's disease -Continue memantine   Code: DNR.  Patient's daughter/son are both healthcare power of attorney.  She confirmed DNR status.  Patient had a DNR paperwork signed in 2022. IVF: N/A Diet: Regular DVT: Lovenox  Dispo: Admit patient to Observation with expected length of stay less than 2 midnights.  SignedGaylan Gerold, DO 12/13/2021, 10:37 AM  Pager: [phone] After 5pm on weekdays and 1pm on weekends: On Call pager: [phone]

## 2021-12-13 NOTE — Consult Note (Signed)
NEUROLOGY CONSULTATION NOTE   Date of service: December 13, 2021 Patient Name: Sierra [name] MRN:  [id number] DOB:  [birth year] Reason for consult: "R sided weakness" Requesting Provider: Deno Etienne, DO _ _ _   _ __   _ __ _ _  __ __   _ __   __ _  History of Present [place] is a 88 y.o. female with PMH significant for advanced dementia, hypothyroidism, chronic adrenal insufficiency on maintenance prednisone, history of ulcerative colitis, diverticulosis, GI bleed, HLD, who was able to walk to the bathroom with assistance at 0300 on 12/13/21 at her facility and then around 0500 was noted to be weak on the right with right facial droop. EMS called and she was brought in as a stroke code.  She had  R facial droop, RUE and RLE weakness on presentation but by the end of CT Head, her symptoms had mostly resolved.  LKW: 0300 on 12/13/21. mRS: 4 tNKASE: not offered. Spontaneous resolution of symptoms. High risk for ICH given her recurrent episodes of GI bleed, ulcerative colitis Thrombectomy: not offered, resolving symptoms and poor functional baseline. NIHSS components Score: Comment  1a Level of Conscious 0'[x]'$  1'[]'$  2'[]'$  3'[]'$      1b LOC Questions 0'[]'$  1'[x]'$  2'[]'$       1c LOC Commands 0'[x]'$  1'[]'$  2'[]'$       2 Best Gaze 0'[x]'$  1'[]'$  2'[]'$       3 Visual 0'[x]'$  1'[]'$  2'[]'$  3'[]'$      4 Facial Palsy 0'[x]'$  1'[]'$  2'[]'$  3'[]'$      5a Motor Arm - left 0'[x]'$  1'[]'$  2'[]'$  3'[]'$  4'[]'$  UN'[]'$    5b Motor Arm - Right 0'[x]'$  1'[]'$  2'[]'$  3'[]'$  4'[]'$  UN'[]'$    6a Motor Leg - Left 0'[x]'$  1'[]'$  2'[]'$  3'[]'$  4'[]'$  UN'[]'$    6b Motor Leg - Right 0'[x]'$  1'[]'$  2'[]'$  3'[]'$  4'[]'$  UN'[]'$    7 Limb Ataxia 0'[x]'$  1'[]'$  2'[]'$  3'[]'$  UN'[]'$     8 Sensory 0'[x]'$  1'[]'$  2'[]'$  UN'[]'$      9 Best Language 0'[x]'$  1'[]'$  2'[]'$  3'[]'$      10 Dysarthria 0'[x]'$  1'[]'$  2'[]'$  UN'[]'$      11 Extinct. and Inattention 0'[x]'$  1'[]'$  2'[]'$       TOTAL: 1      ROS   Unable to get reliable ROS 2/2 dementia and hard of hearing.  Past History   Past Medical History:  Diagnosis Date   Acute asthmatic bronchitis    Acute respiratory failure with hypoxia  (HCC) 06/11/2016   Allergic rhinitis    Arthritis    Hx R shoulder bursitis, pain L knee and L hip   Asthmatic bronchitis    ATYPICAL MYCOBACTERIAL INFECTION 05/05/2010   Sputum cx Pos 12/ 2011     Balance problem    SINCE BRAIN TUMOR REMOVED IN 2002-BENIGN TUMOR-PT HAS ADRENAL INSUFFICIENCY AND TAKES DAILY PREDNISONE   Blood transfusion    Cardiac murmur    DOES NOT CAUSE ANY SYMPTOMS   Chronic kidney disease    Chronic pharyngitis    Colonic diverticular abscess 02/01/2017   Diverticulitis, colon    Dizziness    DOE (dyspnea on exertion)    Esophageal reflux    GI bleed 01/06/2013   Hyperlipemia    Hypertension    Hypothyroidism    IBS (irritable bowel syndrome)    Leukocytosis    LGI bleed 01/05/2013   Other specified iron deficiency anemias    PNEUMONIA 06/02/2010   Qualifier: Diagnosis of  By: Annamaria Boots MD, Clinton D    Postop Hyponatremia  07/28/2011   Rectal bleeding 01/03/2014   Recurrent upper respiratory infection (URI) 07/10/2011    ACUTE BRONCHITIS - extra prednisone in addition to the daily prednisone and a Z-Pak   Thrombocytosis    THRUSH 04/30/2009   Qualifier: Diagnosis of  By: Annamaria Boots MD, Clinton D    Ulceration, colon    Ulcerative colitis    Past Surgical History:  Procedure Laterality Date   ABDOMINAL HYSTERECTOMY  1985   APPENDECTOMY     COLONOSCOPY N/A 01/05/2013   Procedure: COLONOSCOPY;  Surgeon: Cleotis Nipper, MD;  Location: [facility] ENDOSCOPY;  Service: Endoscopy;  Laterality: N/A;   COLONOSCOPY N/A 01/04/2014   Procedure: COLONOSCOPY;  Surgeon: Winfield Cunas., MD;  Location: [facility] ENDOSCOPY;  Service: Endoscopy;  Laterality: N/A;   CRANIOTOMY     DILATION AND CURETTAGE OF UTERUS  1967   ESOPHAGOGASTRODUODENOSCOPY N/A 01/05/2013   Procedure: ESOPHAGOGASTRODUODENOSCOPY (EGD);  Surgeon: Cleotis Nipper, MD;  Location: [facility] ENDOSCOPY;  Service: Endoscopy;  Laterality: N/A;   ESOPHAGOGASTRODUODENOSCOPY N/A 06/26/2014   Procedure: ESOPHAGOGASTRODUODENOSCOPY (EGD);   Surgeon: Winfield Cunas., MD;  Location: [facility] ENDOSCOPY;  Service: Endoscopy;  Laterality: N/A;   EYE SURGERY     2003 -RIGHT CATARACT EXTRACTED AND LEFT WAS DONE IN 2004   FLEXIBLE SIGMOIDOSCOPY N/A 06/26/2014   Procedure: FLEXIBLE SIGMOIDOSCOPY;  Surgeon: Winfield Cunas., MD;  Location: [facility] ENDOSCOPY;  Service: Endoscopy;  Laterality: N/A;   FRONTALIS SUSPENSION  10-09-2010   lifting eyelids AND SECOND EYE SURGERY November 19, 2010   meningioma resected  20O2   PARTIAL COLECTOMY  2008   subglottal mucocoel  2000   THYROIDECTOMY  1986   TONSILLECTOMY  1938   TOTAL HIP ARTHROPLASTY  OCT 2006   right   TOTAL HIP ARTHROPLASTY  07/27/2011   Procedure: TOTAL HIP ARTHROPLASTY;  Surgeon: Gearlean Alf, MD;  Location: [facility] ORS;  Service: Orthopedics;  Laterality: Left;   VESICOVAGINAL FISTULA CLOSURE W/ TAH     WRIST TENDON LESION REMOVED 2007     Family History  Problem Relation Age of Onset   Heart attack Father        deceased   Social History   Socioeconomic History   Marital status: Married    Spouse name: [name]   Number of children: 2   Years of education: Not on file   Highest education level: Not on file  Occupational History   Occupation: Retired    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   Biaxin [Clarithromycin] Other (See Comments)    Bitter taste   Ciprofloxacin Rash   Codeine Nausea And Vomiting   Dilantin [Phenytoin] Rash   Doxycycline Nausea Only    Very nauseated   Lactose Intolerance (Gi) Other (See Comments)    gas   Restasis [Cyclosporine] Other (See Comments) and Swelling    Eyes burn    Sulfonamide Derivatives Rash    Medications  (Not in a hospital admission)    Vitals   Vitals:   12/13/21 0700  Weight: 70.6 kg  Height: '5\' 3"'$  (1.6 m)     Body mass index is 27.57 kg/m.  Physical Exam   General: Laying comfortably in bed; in no acute distress.  HENT: Normal oropharynx and mucosa. Normal external appearance of ears and nose.  Neck: Supple, no pain or tenderness  CV: No JVD. No peripheral edema.  Pulmonary: Symmetric Chest rise. Normal respiratory effort.  Abdomen: Soft to touch, non-tender.  Ext: No cyanosis, edema, or deformity  Skin: No rash. Normal palpation of skin.   Musculoskeletal: Normal digits and nails by inspection. No clubbing.   Neurologic Examination  Mental status/Cognition: Alert, oriented to self, age, but not to month and year, good attention. Speech/language: Non fluent, comprehension intact, object naming intact, repetition intact.  Cranial nerves:   CN II Pupils equal and reactive to light, no VF deficits   CN III,IV,VI EOM intact, no gaze preference or deviation, no nystagmus   CN V normal sensation in V1, V2, and V3 segments bilaterally   CN VII no asymmetry, no nasolabial fold flattening   CN VIII normal hearing to speech   CN IX & X normal palatal elevation, no uvular deviation   CN XI 5/5 head turn and 5/5 shoulder shrug bilaterally   CN XII midline tongue protrusion    Motor:  Muscle bulk: poor, tone normal Mvmt Root Nerve  Muscle Right Left Comments  SA C5/6 Ax Deltoid     EF C5/6 Mc Biceps 5 5   EE C6/7/8 Rad Triceps 5 5   WF C6/7 Med FCR     WE C7/8 PIN ECU     F Ab C8/T1 U ADM/FDI 4+ 5   HF L1/2/3 Fem Illopsoas 4+ 5   KE L2/3/4 Fem Quad     DF L4/5 D Peron Tib Ant 5 5   PF S1/2 Tibial Grc/Sol 5 5    Sensation:  Light touch Intact throughout   Pin prick    Temperature    Vibration   Proprioception    Coordination/Complex Motor:  - Finger to Nose intact BL with mild tremoring BL - Heel to shin unable to do due to pain. - Rapid alternating movement are slowed throughout - Gait: Deferred for patient safety.  Labs   CBC: No results for input(s): "WBC", "NEUTROABS", "HGB", "HCT", "MCV", "PLT" in the last 168 hours.  Basic Metabolic Panel:  Lab Results  Component Value Date   NA 138 11/20/2021   K 3.6 11/20/2021   CO2 26 11/20/2021   GLUCOSE 113 (H) 11/20/2021   BUN 13 11/20/2021   CREATININE 0.79 11/20/2021   CALCIUM 9.0 11/20/2021   GFRNONAA >60 11/20/2021   GFRAA 39 (L) 01/29/2020   Lipid Panel:  Lab Results  Component Value Date   LDLCALC 133 (H) 02/01/2017   HgbA1c:  Lab Results  Component Value Date  HGBA1C 6.9 (H) 11/01/2021   Urine Drug Screen: No results found for: "LABOPIA", "COCAINSCRNUR", "LABBENZ", "AMPHETMU", "THCU", "LABBARB"  Alcohol Level     Component Value Date/Time   ETH <10 08/25/2019 1454    CT Head without contrast(Personally reviewed): CTH was negative for a large hypodensity concerning for a large territory infarct or hyperdensity concerning for an ICH. Chronic left parietal encephalomalacia.  CT angio Head and Neck with contrast: pending  MRI Brain(Personally reviewed): Pending  Impression   ALINE [place]ES[place] is a 88 y.o. female with PMH significant for advanced dementia, hypothyroidism, chronic adrenal insufficiency on maintenance prednisone, history of ulcerative colitis,  diverticulosis, GI bleed, HLD, who presents with R sided weakness. Symptoms resolving in the ED and NIHSS down to a 1.  Suspect that she probably had a small vessel stroke or TIA.  Primary Diagnosis:  Cerebral infarction, unspecified.  Recommendations  - Frequent Neuro checks per stroke unit protocol - Recommend brain imaging with MRI Brain without contrast - Recommend Vascular imaging with CT angio head and neck - Recommend obtaining TTE - Recommend obtaining Lipid panel with LDL - Please start statin if LDL > 70 - Recommend HbA1c - Antithrombotic - Aspirin '81mg'$  daily. Only Aspirin due to hx of UC and GI bleed. - Recommend DVT ppx - SBP goal - permissive hypertension first 24 h < 220/110. Held home meds.  - Recommend Telemetry monitoring for arrythmia - Recommend bedside swallow screen prior to PO intake. - Stroke education booklet - Recommend PT/OT/SLP consult   ______________________________________________________________________   Thank you for the opportunity to take part in the care of this patient. If you have any further questions, please contact the neurology consultation attending.  Signed,  [place] Pager Number [phone] _ _ _   _ __   _ __ _ _  __ __   _ __   __ _

## 2021-12-13 NOTE — Code Documentation (Signed)
Stroke Response Nurse Documentation Code Documentation  Sierra [name] is a 88 y.o. female arriving to [facility]  via [facility] on 8/5 with past medical hx of GIB, dementia,HTN. On aspirin 81 mg daily. Code stroke was activated by EMS.   Patient from SNF where she was LKW at 0300 and now complaining of right sided weakness.   Stroke team at the bedside on patient arrival. Labs drawn and patient cleared for CT by Dr. Leonette Monarch. Patient to CT with team. NIHSS 1, see documentation for details and code stroke times. Patient with disoriented on exam. The following imaging was completed:  CT Head. Patient is not a candidate for IV Thrombolytic due to resolution of symptoms. Patient is not a candidate for IR due to No LVO.    Bedside handoff with ED RN [place].    Madelynn Done  Stroke Response RN

## 2021-12-13 NOTE — Progress Notes (Signed)
STROKE TEAM PROGRESS NOTE   SUBJECTIVE (INTERVAL HISTORY) Her RN is at the bedside.  Overall her condition is stable. Per RN, pt was able to take meds with apple sauce but with thin liquid, she was choking. Will need formal speech eval. Pt still has mild right pronator drift and probably mild right nasolabial fold flattening.    OBJECTIVE Temp:  [97 F (36.1 C)-97.7 F (36.5 C)] 97.7 F (36.5 C) (08/05 1042) Pulse Rate:  [75-85] 84 (08/05 1042) Resp:  [13-25] 14 (08/05 1042) BP: (142-174)/(75-88) 153/75 (08/05 1042) SpO2:  [92 %-94 %] 93 % (08/05 1042) Weight:  [70.6 kg] 70.6 kg (08/05 0700)  Recent Labs  Lab 12/13/21 0656  GLUCAP 118*   Recent Labs  Lab 12/13/21 0701 12/13/21 0702  NA 137 139  K 3.5 3.9  CL 102 101  CO2 25  --   GLUCOSE 121* 120*  BUN 13 19  CREATININE 0.87 0.80  CALCIUM 9.1  --    Recent Labs  Lab 12/13/21 0701  AST 29  ALT 34  ALKPHOS 39  BILITOT 0.6  PROT 7.0  ALBUMIN 3.4*   Recent Labs  Lab 12/13/21 0701 12/13/21 0702 12/13/21 1055  WBC 11.5*  --   --   NEUTROABS 8.6*  --   --   HGB 11.7* 12.2 12.1  HCT 37.1 36.0 37.4  MCV 91.6  --   --   PLT 269  --   --    No results for input(s): "CKTOTAL", "CKMB", "CKMBINDEX", "TROPONINI" in the last 168 hours. Recent Labs    12/13/21 0701  LABPROT 14.2  INR 1.1   No results for input(s): "COLORURINE", "LABSPEC", "PHURINE", "GLUCOSEU", "HGBUR", "BILIRUBINUR", "KETONESUR", "PROTEINUR", "UROBILINOGEN", "NITRITE", "LEUKOCYTESUR" in the last 72 hours.  Invalid input(s): "APPERANCEUR"     Component Value Date/Time   CHOL 205 (H) 12/13/2021 1055   TRIG 245 (H) 12/13/2021 1055   HDL 44 12/13/2021 1055   CHOLHDL 4.7 12/13/2021 1055   VLDL 49 (H) 12/13/2021 1055   LDLCALC 112 (H) 12/13/2021 1055   LDLCALC 133 (H) 02/01/2017 1128   Lab Results  Component Value Date   HGBA1C 6.4 (H) 12/13/2021   No results found for: "LABOPIA", "COCAINSCRNUR", "LABBENZ", "AMPHETMU", "THCU", "LABBARB"   Recent Labs  Lab 12/13/21 0700  ETH <10    I have personally reviewed the radiological images below and agree with the radiology interpretations.  CT ANGIO HEAD NECK W WO CM  Result Date: 12/13/2021 CLINICAL DATA:  Stroke follow-up EXAM: CT ANGIOGRAPHY HEAD AND NECK TECHNIQUE: Multidetector CT imaging of the head and neck was performed using the standard protocol during bolus administration of intravenous contrast. Multiplanar CT image reconstructions and MIPs were obtained to evaluate the vascular anatomy. Carotid stenosis measurements (when applicable) are obtained utilizing NASCET criteria, using the distal internal carotid diameter as the denominator. RADIATION DOSE REDUCTION: This exam was performed according to the departmental dose-optimization program which includes automated exposure control, adjustment of the mA and/or kV according to patient size and/or use of iterative reconstruction technique. CONTRAST:  16m OMNIPAQUE IOHEXOL 350 MG/ML SOLN COMPARISON:  Brain MRI from earlier today FINDINGS: CTA NECK FINDINGS Aortic arch: Atheromatous plaque.  Two vessel branching. Right carotid system: Mild atheromatous calcification at the bifurcation. No stenosis or ulceration. Left carotid system: Mild calcified plaque at the bifurcation. No stenosis or ulceration. Vertebral arteries: No proximal subclavian stenosis or ulceration. Atheromatous plaque at the left vertebral origin. No vertebral stenosis or beading Skeleton: Advanced and  generalized cervical spine degeneration. No acute or focal finding. Unremarkable left parietal craniotomy Other neck: No acute finding Upper chest: No acute finding Review of the MIP images confirms the above findings CTA HEAD FINDINGS Anterior circulation: Atheromatous calcification of the carotid siphons. Advanced atheromatous irregularity of the MCA branches with diffuse mild to moderate narrowings bilaterally. The right A1 segment is small and hypoplastic appearing.  Negative for aneurysm Posterior circulation: Vertebral and basilar arteries are diffusely patent. Atheromatous irregularity of bilateral posterior cerebral arteries. Negative for aneurysm Venous sinuses: Unremarkable Anatomic variants: Unremarkable Review of the MIP images confirms the above findings IMPRESSION: 1. No emergent finding. 2. Atherosclerosis with advanced intracranial branch involvement. No proximal/correctable flow reducing stenosis. Electronically Signed   By: Jorje Guild M.D.   On: 12/13/2021 10:14   DG Chest Port 1 View  Result Date: 12/13/2021 CLINICAL DATA:  Cough. EXAM: PORTABLE CHEST 1 VIEW COMPARISON:  04/17/2020 FINDINGS: 0943 hours. Stable asymmetric elevation right hemidiaphragm. The cardio pericardial silhouette is enlarged. Interstitial markings are diffusely coarsened with chronic features. Bones are diffusely demineralized. Telemetry leads overlie the chest. IMPRESSION: Stable exam. No acute cardiopulmonary findings. Electronically Signed   By: Misty Stanley M.D.   On: 12/13/2021 10:12   MR BRAIN WO CONTRAST  Result Date: 12/13/2021 CLINICAL DATA:  Right-sided weakness EXAM: MRI HEAD WITHOUT CONTRAST TECHNIQUE: Multiplanar, multiecho pulse sequences of the brain and surrounding structures were obtained without intravenous contrast. COMPARISON:  Head CT from earlier today FINDINGS: Brain: No acute infarction, hemorrhage, hydrocephalus, extra-axial collection or mass lesion. Chronic small vessel ischemia in the hemispheric white matter and pons. Brain atrophy that is generalized. Chronic left parietal and upper occipital encephalomalacia beneath the craniotomy site. Vascular: Normal flow voids. Skull and upper cervical spine: Unremarkable remote left parietal craniotomy. Sinuses/Orbits: Bilateral cataract resection IMPRESSION: No acute finding.  Stable atrophy and encephalomalacia. Electronically Signed   By: Jorje Guild M.D.   On: 12/13/2021 10:03   CT HEAD CODE STROKE WO  CONTRAST  Result Date: 12/13/2021 CLINICAL DATA:  Code stroke. Neuro deficit with acute stroke suspected. Right-sided weakness EXAM: CT HEAD WITHOUT CONTRAST TECHNIQUE: Contiguous axial images were obtained from the base of the skull through the vertex without intravenous contrast. RADIATION DOSE REDUCTION: This exam was performed according to the departmental dose-optimization program which includes automated exposure control, adjustment of the mA and/or kV according to patient size and/or use of iterative reconstruction technique. COMPARISON:  12/31/2019 FINDINGS: Brain: No evidence of acute infarction, hemorrhage, hydrocephalus, extra-axial collection or mass lesion/mass effect. Encephalomalacia is noted in the left parietal lobe deep to a prior craniotomy. Generalized brain atrophy with chronic small vessel ischemia. Chronic lacunar infarct at the left thalamus when compared to prior. Vascular: No hyperdense vessel or unexpected calcification. Skull: Unremarkable left-sided craniotomy. Sinuses/Orbits: Bilateral cataract resection. Atheromatous calcification Other: Findings directly communicated with Dr. Lorrin Goodell at 7:08 am on 12/13/2021. ASPECTS ([place] Stroke Program Early CT Score) - Ganglionic level infarction (caudate, lentiform nuclei, internal capsule, insula, M1-M3 cortex): 7 - Supraganglionic infarction (M4-M6 cortex): 3-when accounting for chronic infarct Total score (0-10 with 10 being normal): 10 IMPRESSION: 1. Stable head CT.  No acute finding. 2. Chronic small vessel disease and left parietal encephalomalacia. 3. Generalized brain atrophy. Electronically Signed   By: Jorje Guild M.D.   On: 12/13/2021 07:09   CT Abdomen Pelvis W Contrast  Result Date: 11/20/2021 CLINICAL DATA:  Abdominal pain, acute, nonlocalized EXAM: CT ABDOMEN AND PELVIS WITH CONTRAST TECHNIQUE: Multidetector CT imaging of the  abdomen and pelvis was performed using the standard protocol following bolus administration of  intravenous contrast. RADIATION DOSE REDUCTION: This exam was performed according to the departmental dose-optimization program which includes automated exposure control, adjustment of the mA and/or kV according to patient size and/or use of iterative reconstruction technique. CONTRAST:  129m OMNIPAQUE IOHEXOL 300 MG/ML  SOLN COMPARISON:  CT abdomen pelvis 08/25/2019 FINDINGS: Lower chest: Bibasilar atelectasis/scarring. Extensive mitral annular calcifications. Aortic valve calcifications. Hepatobiliary: Severe diffuse hepatic steatosis. Focal fatty sparing along the gallbladder fossa. There is a small adjacent hepatic cyst measuring 1.2 cm. The gallbladder is unremarkable. Pancreas: There is a 3.9 x 3.1 cm cystic lesion in the pancreatic body, which previously measured up to 3.5 x 2.9 cm in April 2021. There is a nodular enhancing lesion in the pancreatic tail measuring up to 1.0 cm, previously 1.2 cm. Spleen: Normal in size with multiple low-density lesions again seen, likely small cysts or hemangiomas. Adrenals/Urinary Tract: Adrenal glands are unremarkable. No hydronephrosis. There is a nonobstructive 4 mm stone in the right lower pole. Multiple small cystic lesions in the kidneys bilaterally, most of which are too small to characterize. There is a notable exophytic right upper pole renal cyst measuring 0.8 cm which is more dense in comparison to prior exam, measuring up to 93 Hounsfield units, previously 25 Hounsfield units. This persists on delayed without significant washout. This likely reflects a now hemorrhagic or proteinaceous cyst, as there is no significant change in size in comparison to prior exam in April 2021. No follow-up imaging is recommended. Bladder is markedly distended with some intraluminal gas. Stomach/Bowel: The stomach is within normal limits. There is no evidence of bowel obstruction.Prior appendectomy. Colonic diverticulosis. No evidence of acute diverticulitis. Vascular/Lymphatic: No  lymphadenopathy. Aortoiliac atherosclerosis. No AAA. Reproductive: Prior hysterectomy. Other: No hernia. No ascites. No free air. Atrophic anterior abdominal wall musculature. Musculoskeletal: There is a T11 compression fracture with approximately 50% height loss, reported on prior MRI 10/01/2020 with 10-20% height loss at that time (images not retrievable for direct comparison). Unchanged height loss of L5. There are multilevel degenerative changes of the spine with trace retrolisthesis at L4-L5. Total hip arthroplasties bilaterally are unchanged. IMPRESSION: Marked distension of the urinary bladder with intraluminal gas, correlate with history of need for bladder catheterizations. Colonic diverticulosis without evidence of acute diverticulitis. Hepatic steatosis with fatty sparing along the gallbladder fossa. Slightly increased size of a cystic lesion in the pancreatic body measuring up to 3.9 x 3.1 cm, previously 3.5 x 2.9 cm in April 2021. No significant change in the small hyperdense mass in the pancreatic tail, which measures up to 1.0 cm. Chronic T11 compression fracture with approximately 50% height loss, reported on prior MRI 10/01/2020 with 10-20% height loss at that time (images not retrievable for direct comparison), suggesting progressive height loss. Additional chronic findings as described above. Electronically Signed   By: JMaurine SimmeringM.D.   On: 11/20/2021 11:12     PHYSICAL EXAM  Temp:  [97 F (36.1 C)-97.7 F (36.5 C)] 97.7 F (36.5 C) (08/05 1042) Pulse Rate:  [75-85] 84 (08/05 1042) Resp:  [13-25] 14 (08/05 1042) BP: (142-174)/(75-88) 153/75 (08/05 1042) SpO2:  [92 %-94 %] 93 % (08/05 1042) Weight:  [70.6 kg] 70.6 kg (08/05 0700)  General - Well nourished, well developed, in no apparent distress.  Ophthalmologic - fundi not visualized due to noncooperation.  Cardiovascular - Regular rhythm and rate.  Neuro - awake, alert, eyes open, orientated to place, but not  to time or  age. No aphasia, hard of hearing, but answer questions appropriately, following all simple commands. Able to name and repeat. No gaze palsy, tracking bilaterally, visual field full. Mild right nasolabial fold flattening. Tongue midline. RUE pronator drift, but bicep and tricep symmetrical to the left. Bilaterally LEs 4/5, no drift. Sensation symmetrical bilaterally, b/l FTN intact, gait not tested.     ASSESSMENT/PLAN Sierra [name] is a 88 y.o. female with history of hypertension, hyperlipidemia, dementia, ulcerative colitis, adrenal insufficiency on prednisone, GI bleeding admitted for right-sided weakness and right facial droop. No tPA given due to rapid resolved symptoms.    Left brain TIA vs. DWI negative stroke  Still has mild right upper extremity drift and right nasolabial fold flattening on exam CT no acute abnormality MRI no acute finding CTA head and neck unremarkable 2D Echo pending LDL 112 HgbA1c 6.4 Lovenox for VTE prophylaxis No antithrombotic prior to admission, now on aspirin 81 mg daily. No DAPT given recent rectal bleeding and ulcerative colitis Ongoing aggressive stroke risk factor management Therapy recommendations: Pending Disposition: Pending  Hypertension Stable gradually normalized within 5-7 days. Long term BP goal normotensive  Hyperlipidemia Home meds: None LDL 112, goal < 70 Now on Crestor 10 No high intensity statin given advanced age and LDL not far from goal Continue statin at discharge  Other Stroke Risk Factors Advanced age  Other Active Problems Ulcerative colitis, on mesalamine Adrenal insufficiency on prednisone Dementia on [facility] day # 0    Rosalin Hawking, MD PhD Stroke Neurology 12/13/2021 1:07 PM    To contact Stroke Continuity provider, please refer to [email or web address] After hours, contact General Neurology

## 2021-12-13 NOTE — ED Triage Notes (Signed)
Pt BIB [facility] from [facility] care. Pt's LKW was 0300 when as NT walked the pt to the restroom with no issues. Pt was then checked on again at 0500 and was found to have left leg weakness, a right facial droop and a right arm drop. Pt has bilateral 18gs.

## 2021-12-13 NOTE — ED Notes (Signed)
Pt transported to MRI 

## 2021-12-13 NOTE — Evaluation (Signed)
Clinical/Bedside Swallow Evaluation Patient Details  Name: Sierra [name] MRN: [id number] Date of Birth: 1929/01/28  Today's Date: 12/13/2021 Time: SLP Start Time (ACUTE ONLY): 1450 SLP Stop Time (ACUTE ONLY): 1505 SLP Time Calculation (min) (ACUTE ONLY): 15 min  Past Medical History:  Past Medical History:  Diagnosis Date   Acute asthmatic bronchitis    Acute respiratory failure with hypoxia ([place]) 06/11/2016   Allergic rhinitis    Arthritis    Hx R shoulder bursitis, pain L knee and L hip   Asthmatic bronchitis    ATYPICAL MYCOBACTERIAL INFECTION 05/05/2010   Sputum cx Pos 12/ 2011     Balance problem    SINCE BRAIN TUMOR REMOVED IN 2002-BENIGN TUMOR-PT HAS ADRENAL INSUFFICIENCY AND TAKES DAILY PREDNISONE   Blood transfusion    Cardiac murmur    DOES NOT CAUSE ANY SYMPTOMS   Chronic kidney disease    Chronic pharyngitis    Colonic diverticular abscess 02/01/2017   Diverticulitis, colon    Dizziness    DOE (dyspnea on exertion)    Esophageal reflux    GI bleed 01/06/2013   Hyperlipemia    Hypertension    Hypothyroidism    IBS (irritable bowel syndrome)    Leukocytosis    LGI bleed 01/05/2013   Other specified iron deficiency anemias    PNEUMONIA 06/02/2010   Qualifier: Diagnosis of  By: Annamaria Boots MD, [place] D    Postop Hyponatremia 07/28/2011   Rectal bleeding 01/03/2014   Recurrent upper respiratory infection (URI) 07/10/2011    ACUTE BRONCHITIS - extra prednisone in addition to the daily prednisone and a Z-Pak   Thrombocytosis    THRUSH 04/30/2009   Qualifier: Diagnosis of  By: Annamaria Boots MD, Clinton D    Ulceration, colon    Ulcerative colitis    Past Surgical History:  Past Surgical History:  Procedure Laterality Date   ABDOMINAL HYSTERECTOMY  1985   APPENDECTOMY     COLONOSCOPY N/A 01/05/2013   Procedure: COLONOSCOPY;  Surgeon: Cleotis Nipper, MD;  Location[facility]COPY;  Service: Endoscopy;  Laterality: N/A;   COLONOSCOPY N/A 01/04/2014   Procedure: COLONOSCOPY;   Surgeon: Winfield Cunas., MD;  Location: [facility] ENDOSCOPY;  Service: Endoscopy;  Laterality: N/A;   CRANIOTOMY     DILATION AND CURETTAGE OF UTERUS  1967   ESOPHAGOGASTRODUODENOSCOPY N/A 01/05/2013   Procedure: ESOPHAGOGASTRODUODENOSCOPY (EGD);  Surgeon: Cleotis Nipper, MD;  Location: [facility] ENDOSCOPY;  Service: Endoscopy;  Laterality: N/A;   ESOPHAGOGASTRODUODENOSCOPY N/A 06/26/2014   Procedure: ESOPHAGOGASTRODUODENOSCOPY (EGD);  Surgeon: Winfield Cunas., MD;  Location: [facility] ENDOSCOPY;  Service: Endoscopy;  Laterality: N/A;   EYE SURGERY     2003 -RIGHT CATARACT EXTRACTED AND LEFT WAS DONE IN 2004   FLEXIBLE SIGMOIDOSCOPY N/A 06/26/2014   Procedure: FLEXIBLE SIGMOIDOSCOPY;  Surgeon: Winfield Cunas., MD;  Location: [facility] ENDOSCOPY;  Service: Endoscopy;  Laterality: N/A;   FRONTALIS SUSPENSION  10-09-2010   lifting eyelids AND SECOND EYE SURGERY November 19, 2010   meningioma resected  20O2   PARTIAL COLECTOMY  2008   subglottal mucocoel  2000   THYROIDECTOMY  1986   TONSILLECTOMY  1938   TOTAL HIP ARTHROPLASTY  OCT 2006   right   TOTAL HIP ARTHROPLASTY  07/27/2011   Procedure: TOTAL HIP ARTHROPLASTY;  Surgeon: Gearlean Alf, MD;  Location: [facility] ORS;  Service: Orthopedics;  Laterality: Left;   VESICOVAGINAL FISTULA CLOSURE W/ TAH     WRIST TENDON LESION REMOVED 2007  HPI:  88 y/o female admitted from SNF secondary to R sided weakness. MRI of the brain was negative for any acute findings. PMH including but not limited to advanced Alzheimer disease, depression, hypertension, hypothyroidism, asthma, ulcerative colitis, chronic adrenal insufficiency is on prednisone    Assessment / Plan / Recommendation  Clinical Impression  Pt presents with clinical symptoms consistent with a suspected mild oropharyngeal dysphagia. Per chart review and RN report, isolated episode of overt coughing noted during medicines given whole with liquid earlier this date; prompting clinical swallow evaluation. Pt does  have a hx of advanced dementia. She is able to follow simple commands inconsistently with cues. Pt with subtle delayed throat clearing following ice chips. She exhibited suspected delay in swallow initiation per palpation with thin liquid POs. Pt with prolonged mastication of solid POs (exhibited some distractibility) due to cognitive deficits. During thin liquid alternation (with some solid PO in oral cavity) pt exhibited some discoordination orally between swallowing and breathing and exhibited audible wetness upon inhaling; following by throat clearing. Vocal quality was cleared by throat clear; suspect possible laryngeal penetration. Will continue to closely follow. Recommend dysphagia 3 (mechanical soft) and thin liquids with meds crushed in puree and full supervision with all POs.  SLP Visit Diagnosis: Dysphagia, oral phase (R13.11);Dysphagia, unspecified (R13.10)    Aspiration Risk  Mild aspiration risk;Moderate aspiration risk    Diet Recommendation   Dysphagia 3 (mechanical soft) thin liquids  Medication Administration: Crushed with puree    Other  Recommendations Oral Care Recommendations: Oral care BID    Recommendations for follow up therapy are one component of a multi-disciplinary discharge planning process, led by the attending physician.  Recommendations may be updated based on patient status, additional functional criteria and insurance authorization.  Follow up Recommendations Skilled nursing-short term rehab (<3 hours/day)      Assistance Recommended at Discharge Frequent or constant Supervision/Assistance  Functional Status Assessment Patient has had a recent decline in their functional status and demonstrates the ability to make significant improvements in function in a reasonable and predictable amount of time.  Frequency and Duration min 2x/week  2 weeks       Prognosis Prognosis for Safe Diet Advancement: Good Barriers to Reach Goals: Cognitive deficits       Swallow Study   General Date of Onset: 12/13/21 HPI: 88 y/o female admitted from SNF secondary to R sided weakness. MRI of the brain was negative for any acute findings. PMH including but not limited to advanced Alzheimer disease, depression, hypertension, hypothyroidism, asthma, ulcerative colitis, chronic adrenal insufficiency is on prednisone Type of Study: Bedside Swallow Evaluation Previous Swallow Assessment: none on file Diet Prior to this Study: NPO Temperature Spikes Noted: No Respiratory Status: Room air History of Recent Intubation: No Behavior/Cognition: Alert;Distractible Oral Cavity Assessment: Within Functional Limits Oral Care Completed by SLP: No Oral Cavity - Dentition: Adequate natural dentition Vision: Functional for self-feeding Self-Feeding Abilities: Needs set up Patient Positioning: Upright in bed Baseline Vocal Quality: Normal Volitional Cough: Cognitively unable to elicit Volitional Swallow: Unable to elicit    Oral/Motor/Sensory Function Overall Oral Motor/Sensory Function: Mild impairment Facial ROM: Reduced left Facial Symmetry: Abnormal symmetry left Facial Strength: Reduced left Lingual Strength: Reduced   Ice Chips Ice chips: Impaired Presentation: Spoon Oral Phase Impairments: Reduced lingual movement/coordination Oral Phase Functional Implications: Prolonged oral transit Pharyngeal Phase Impairments: Suspected delayed Swallow;Throat Clearing - Delayed   Thin Liquid Thin Liquid: Impaired Presentation: Cup;Straw Oral Phase Impairments: Reduced lingual movement/coordination Oral Phase Functional  Implications: Prolonged oral transit (some oral swishing noted) Pharyngeal  Phase Impairments: Suspected delayed Swallow;Multiple swallows    Nectar Thick Nectar Thick Liquid: Not tested   Honey Thick Honey Thick Liquid: Not tested   Puree Puree: Within functional limits   Solid     Solid: Impaired Presentation: Self Fed Oral Phase Impairments:  Reduced lingual movement/coordination;Impaired mastication Oral Phase Functional Implications: Prolonged oral transit Pharyngeal Phase Impairments: Suspected delayed Swallow;Multiple swallows      Hayden Rasmussen MA, CCC-SLP Acute Rehabilitation Services   12/13/2021,3:09 PM

## 2021-12-13 NOTE — ED Provider Notes (Signed)
[facility] EMERGENCY DEPARTMENT Provider Note   CSN: [id number] Arrival date & time: 12/13/21  5397  An emergency department physician performed an initial assessment on this suspected stroke patient at [phone].  History  Chief Complaint  Patient presents with   Code Stroke    Sierra [name] is a 88 y.o. female.  88 yo F with a cc of R sided weakness.  This has been going on since this morning.  Arrived as a code stroke.  Patient is confused and does not remember any weakness at any time.  Denies headache denies chest pain denies abdominal pain.  Has been coughing a little bit off and on for a few days.        Home Medications Prior to Admission medications   Medication Sig Start Date End Date Taking? Authorizing Provider  acetaminophen (TYLENOL) 325 MG tablet Take 650 mg by mouth every 8 (eight) hours as needed for moderate pain.    [provider]  albuterol (PROVENTIL HFA) 108 (90 Base) MCG/ACT inhaler Inhale 2 puffs into the lungs in the morning and at bedtime.    [provider]  amLODipine (NORVASC) 2.5 MG tablet Take 2.5 mg by mouth daily.    [provider]  Artificial Tear Solution (BION TEARS) 0.1-0.3 % SOLN Place 1 drop into both eyes every morning.    [provider]  Calcium Carbonate-Vitamin D3 (CALCIUM 600-D) 600-400 MG-UNIT TABS Take 1 tablet by mouth daily.    [provider]  docusate sodium (COLACE) 100 MG capsule Take 100 mg by mouth 2 (two) times daily as needed for mild constipation.    [provider]  escitalopram (LEXAPRO) 20 MG tablet Take 20 mg by mouth daily.    [provider]  ferrous sulfate 325 (65 FE) MG tablet Take 325 mg by mouth daily with breakfast.    [provider]  guaiFENesin (MUCINEX) 600 MG 12 hr tablet Take 600 mg by mouth in the morning.    [provider]  lactobacillus acidophilus (BACID) TABS tablet Take 1 tablet by mouth 2 (two) times  daily. For 10 days starting 11-17-21 After 10 days continue taking 1 tablet daily. 11/17/21   [provider]  levothyroxine (SYNTHROID) 75 MCG tablet Take 75 mcg by mouth daily. 12/03/17   [provider]  memantine (NAMENDA) 5 MG tablet Take 5 mg by mouth 2 (two) times daily.    [provider]  mesalamine (LIALDA) 1.2 g EC tablet Take 1.2 g by mouth daily with breakfast.    [provider]  nitrofurantoin, macrocrystal-monohydrate, (MACROBID) 100 MG capsule Take 100 mg by mouth 2 (two) times daily. For 7 days started on 11-17-21 11/18/21   [provider]  omeprazole (PRILOSEC) 20 MG capsule Take 20 mg by mouth daily.    [provider]  oxybutynin (DITROPAN-XL) 10 MG 24 hr tablet Take 10 mg by mouth daily.    [provider]  oxymetazoline (AFRIN) 0.05 % nasal spray Place 1 spray into both nostrils 3 (three) times daily as needed (nose bleeds).    [provider]  polyethylene glycol (MIRALAX / GLYCOLAX) 17 g packet Take 17 g by mouth daily.    [provider]  predniSONE (DELTASONE) 5 MG tablet Take 5 mg by mouth daily with breakfast.    [provider]  sodium chloride (OCEAN) 0.65 % SOLN nasal spray Place 1 spray into both nostrils 2 (two) times daily as needed for  congestion.    [provider]  SYMBICORT 160-4.5 MCG/ACT inhaler Inhale 2 puffs into the lungs 2 (two) times daily. 11/10/21   [provider]  Alum & Mag Hydroxide-Simeth (MAGIC MOUTHWASH) SOLN Take 5 mLs by mouth 3 (three) times daily as needed. Thrush   07/21/11  [provider]      Allergies    Aricept [donepezil], Sulfamethoxazole, Tape, Biaxin [clarithromycin], Ciprofloxacin, Codeine, Dilantin [phenytoin], Doxycycline, Lactose intolerance (gi), Restasis [cyclosporine], and Sulfonamide derivatives    Review of Systems   Review of Systems  Physical Exam Updated Vital Signs BP (!) 157/76   Pulse 81   Temp  (!) 97.3 F (36.3 C) (Tympanic)   Resp 13   Ht '5\' 3"'$  (1.6 m)   Wt 70.6 kg   SpO2 93%   BMI 27.57 kg/m  Physical Exam Vitals and nursing note reviewed.  Constitutional:      General: She is not in acute distress.    Appearance: She is well-developed. She is not diaphoretic.  HENT:     Head: Normocephalic and atraumatic.  Eyes:     Pupils: Pupils are equal, round, and reactive to light.  Cardiovascular:     Rate and Rhythm: Normal rate and regular rhythm.     Heart sounds: No murmur heard.    No friction rub. No gallop.  Pulmonary:     Effort: Pulmonary effort is normal.     Breath sounds: No wheezing or rales.  Abdominal:     General: There is no distension.     Palpations: Abdomen is soft.     Tenderness: There is no abdominal tenderness.  Musculoskeletal:        General: No tenderness.     Cervical back: Normal range of motion and neck supple.  Skin:    General: Skin is warm and dry.  Neurological:     Mental Status: She is alert.     Comments: Patient is confused to the scenario, has no appreciable weakness.  Psychiatric:        Behavior: Behavior normal.     ED Results / Procedures / Treatments   Labs (all labs ordered are listed, but only abnormal results are displayed) Labs Reviewed  CBC - Abnormal; Notable for the following components:      Result Value   WBC 11.5 (*)    Hemoglobin 11.7 (*)    All other components within normal limits  DIFFERENTIAL - Abnormal; Notable for the following components:   Neutro Abs 8.6 (*)    Monocytes Absolute 1.1 (*)    Abs Immature Granulocytes 0.15 (*)    All other components within normal limits  COMPREHENSIVE METABOLIC PANEL - Abnormal; Notable for the following components:   Glucose, Bld 121 (*)    Albumin 3.4 (*)    All other components within normal limits  I-STAT CHEM 8, ED - Abnormal; Notable for the following components:   Glucose, Bld 120 (*)    Calcium, Ion 1.14 (*)    All other components within normal  limits  CBG MONITORING, ED - Abnormal; Notable for the following components:   Glucose-Capillary 118 (*)    All other components within normal limits  PROTIME-INR  APTT  ETHANOL    EKG None  Radiology CT HEAD CODE STROKE WO CONTRAST  Result Date: 12/13/2021 CLINICAL DATA:  Code stroke. Neuro deficit with acute stroke suspected. Right-sided weakness EXAM: CT HEAD WITHOUT CONTRAST TECHNIQUE: Contiguous axial images were obtained from the base of the  skull through the vertex without intravenous contrast. RADIATION DOSE REDUCTION: This exam was performed according to the departmental dose-optimization program which includes automated exposure control, adjustment of the mA and/or kV according to patient size and/or use of iterative reconstruction technique. COMPARISON:  12/31/2019 FINDINGS: Brain: No evidence of acute infarction, hemorrhage, hydrocephalus, extra-axial collection or mass lesion/mass effect. Encephalomalacia is noted in the left parietal lobe deep to a prior craniotomy. Generalized brain atrophy with chronic small vessel ischemia. Chronic lacunar infarct at the left thalamus when compared to prior. Vascular: No hyperdense vessel or unexpected calcification. Skull: Unremarkable left-sided craniotomy. Sinuses/Orbits: Bilateral cataract resection. Atheromatous calcification Other: Findings directly communicated with Dr. Lorrin Goodell at 7:08 am on 12/13/2021. ASPECTS (Guy Stroke Program Early CT Score) - Ganglionic level infarction (caudate, lentiform nuclei, internal capsule, insula, M1-M3 cortex): 7 - Supraganglionic infarction (M4-M6 cortex): 3-when accounting for chronic infarct Total score (0-10 with 10 being normal): 10 IMPRESSION: 1. Stable head CT.  No acute finding. 2. Chronic small vessel disease and left parietal encephalomalacia. 3. Generalized brain atrophy. Electronically Signed   By: Jorje Guild M.D.   On: 12/13/2021 07:09    Procedures Procedures    Medications Ordered  in ED Medications  sodium chloride flush (NS) 0.9 % injection 3 mL (has no administration in time range)  aspirin chewable tablet 81 mg (has no administration in time range)    Or  aspirin suppository 300 mg (has no administration in time range)    ED Course/ Medical Decision Making/ A&P                           Medical Decision Making Amount and/or Complexity of Data Reviewed Labs: ordered. Radiology: ordered.   88 yo F with a chief complaints of acute onset weakness.  Per the nursing note this was on the left side but according to the neurologist this was right-sided weakness.  I discussed the case with the neurologist, patient was rapidly improving and thought not to be candidate for thrombolytics.  Her CT scan of the head is without intracranial pathology.  Blood work without anemia no significant electrolyte abnormality.  Plan for observation versus admission based on MRI.  Discussed with medicine for admission.  Neurology did recommend starting aspirin despite recent history of GI bleeding.  Patient is coughing on exam we will obtain a chest x-ray.  The patients results and plan were reviewed and discussed.   Any x-rays performed were independently reviewed by myself.   Differential diagnosis were considered with the presenting HPI.  Medications  sodium chloride flush (NS) 0.9 % injection 3 mL (has no administration in time range)  aspirin chewable tablet 81 mg (has no administration in time range)    Or  aspirin suppository 300 mg (has no administration in time range)    Vitals:   12/13/21 0657 12/13/21 0700 12/13/21 0745  BP: (!) 142/88  (!) 157/76  Pulse: 80  81  Resp: 16  13  Temp: (!) 97.3 F (36.3 C)    TempSrc: Tympanic    SpO2: 93%  93%  Weight:  70.6 kg   Height:  '5\' 3"'$  (1.6 m)     Final diagnoses:  TIA (transient ischemic attack)    Admission/ observation were discussed with the admitting physician, patient and/or family and they are comfortable  with the plan.          Final Clinical Impression(s) / ED Diagnoses Final diagnoses:  TIA (transient  ischemic attack)    Rx / DC Orders ED Discharge Orders     None         Deno Etienne, DO 12/13/21 0820

## 2021-12-13 NOTE — Evaluation (Addendum)
Physical Therapy Evaluation Patient Details Name: Sierra [name] MRN: [id number] DOB: 07/14/1928 Today's Date: 12/13/2021  History of Present Illness  Pt is a 88 y/o female admitted from SNF secondary to R sided weakness. MRI of the brain was negative for any acute findings. PMH including but not limited to advanced Alzheimer disease, depression, hypertension, hypothyroidism, asthma, ulcerative colitis, chronic adrenal insufficiency is on prednisone.   Clinical Impression  Pt presented supine in bed with HOB elevated, awake and willing to participate in therapy session. Pt with history of advanced Alzheimer's disease and unable to provide any reliable history information. However, per chart review pt is from a SNF and was ambulatory with assistance. At the time of evaluation, pt required heavy physical assistance of two for bed mobility and mod-max A to maintain an upright sitting position at EOB. Pt would continue to benefit from skilled physical therapy services at this time while admitted and after d/c to address the below listed limitations in order to improve overall safety and independence with functional mobility.  Of note, while pt was sitting at EOB with PT providing mod-max A to trunk, pt's RN assisting her with her medications. Pt attempted to swallow one pill with water and began choking. RN gave pt a small bite of applesauce and pt was able to swallow without difficulties.         Recommendations for follow up therapy are one component of a multi-disciplinary discharge planning process, led by the attending physician.  Recommendations may be updated based on patient status, additional functional criteria and insurance authorization.  Follow Up Recommendations Skilled nursing-short term rehab (<3 hours/day) Can patient physically be transported by private vehicle: No    Assistance Recommended at Discharge Frequent or constant Supervision/Assistance  Patient can return home with the  following  Two people to help with walking and/or transfers;Two people to help with bathing/dressing/bathroom;Assistance with feeding;Direct supervision/assist for medications management    Equipment Recommendations None recommended by PT  Recommendations for Other Services       Functional Status Assessment Patient has had a recent decline in their functional status and demonstrates the ability to make significant improvements in function in a reasonable and predictable amount of time.     Precautions / Restrictions Precautions Precautions: Fall Precaution Comments: incontinent Restrictions Weight Bearing Restrictions: No      Mobility  Bed Mobility Overal bed mobility: Needs Assistance Bed Mobility: Rolling, Supine to Sit, Sit to Supine Rolling: Min assist   Supine to sit: Mod assist Sit to supine: Max assist, +2 for physical assistance   General bed mobility comments: increased time and effort, multimodal cueing required, assistance needed for trunk management and bilateral LE management off of and back onto bed    Transfers                   General transfer comment: unable to perform as pt began choking on her water and pill while sitting EOB with mod-max A and RN requesting to return to bed to be able to attempt giving pt her other meds (crushed with applesauce)    Ambulation/Gait                  Stairs            Wheelchair Mobility    Modified Rankin (Stroke Patients Only) Modified Rankin (Stroke Patients Only) Pre-Morbid Rankin Score: Moderately severe disability Modified Rankin: Severe disability     Balance Overall balance assessment: Needs assistance Sitting-balance  support: Bilateral upper extremity supported, Single extremity supported Sitting balance-Leahy Scale: Poor Sitting balance - Comments: required mod-max A to maintain an upright sitting position at EOB                                     Pertinent  Vitals/Pain Pain Assessment Pain Assessment: No/denies pain    Home Living Family/patient expects to be discharged to:: Skilled nursing facility                   Additional Comments: per chart review, pt is from SNF    Prior Function Prior Level of Function : Patient poor historian/Family not available             Mobility Comments: per chart review, pt is from a SNF and was ambulatory with assistance       Hand Dominance        Extremity/Trunk Assessment   Upper Extremity Assessment Upper Extremity Assessment: Difficult to assess due to impaired cognition    Lower Extremity Assessment Lower Extremity Assessment: Difficult to assess due to impaired cognition;RLE deficits/detail RLE Deficits / Details: pt with functional R sided weakness, requiring physical assistance with movement of R LE off of and back onto bed, unable to lift against gravity       Communication   Communication: HOH;Expressive difficulties  Cognition Arousal/Alertness: Awake/alert Behavior During Therapy: Flat affect Overall Cognitive Status: History of cognitive impairments - at baseline Area of Impairment: Orientation, Attention, Memory, Following commands, Safety/judgement, Awareness, Problem solving                 Orientation Level: Disoriented to, Place, Time, Situation Current Attention Level: Focused Memory: Decreased short-term memory Following Commands: Follows one step commands inconsistently, Follows one step commands with increased time Safety/Judgement: Decreased awareness of deficits, Decreased awareness of safety Awareness: Intellectual Problem Solving: Slow processing, Decreased initiation, Difficulty sequencing, Requires verbal cues, Requires tactile cues          General Comments      Exercises     Assessment/Plan    PT Assessment Patient needs continued PT services  PT Problem List Decreased strength;Decreased range of motion;Decreased activity  tolerance;Decreased balance;Decreased mobility;Decreased coordination;Decreased cognition;Decreased knowledge of use of DME;Decreased safety awareness;Decreased knowledge of precautions       PT Treatment Interventions DME instruction;Stair training;Functional mobility training;Therapeutic activities;Gait training;Therapeutic exercise;Balance training;Neuromuscular re-education;Patient/family education    PT Goals (Current goals can be found in the Care Plan section)  Acute Rehab PT Goals Patient Stated Goal: "More" - indicating she wanted more applesauce PT Goal Formulation: Patient unable to participate in goal setting Time For Goal Achievement: 12/27/21 Potential to Achieve Goals: Fair    Frequency Min 3X/week     Co-evaluation               AM-PAC PT "6 Clicks" Mobility  Outcome Measure Help needed turning from your back to your side while in a flat bed without using bedrails?: A Little Help needed moving from lying on your back to sitting on the side of a flat bed without using bedrails?: A Lot Help needed moving to and from a bed to a chair (including a wheelchair)?: Total Help needed standing up from a chair using your arms (e.g., wheelchair or bedside chair)?: A Lot Help needed to walk in hospital room?: Total Help needed climbing 3-5 steps with a railing? : Total 6 Click  Score: 10    End of Session   Activity Tolerance: Patient tolerated treatment well Patient left: in bed;with call bell/phone within reach;with bed alarm set;Other (comment) (RN present in room) Nurse Communication: Mobility status;Precautions PT Visit Diagnosis: Other abnormalities of gait and mobility (R26.89);Other symptoms and signs involving the nervous system (R29.898)    Time: 1205-1220 PT Time Calculation (min) (ACUTE ONLY): 15 min   Charges:   PT Evaluation $PT Eval Moderate Complexity: 1 Mod          Eduard Clos, PT, DPT  Acute Rehabilitation Services Office  [place] 12/13/2021, 12:41 PM

## 2021-12-14 ENCOUNTER — Observation Stay (HOSPITAL_COMMUNITY): Payer: Medicare Other

## 2021-12-14 DIAGNOSIS — F32A Depression, unspecified: Secondary | ICD-10-CM | POA: Diagnosis present

## 2021-12-14 DIAGNOSIS — E039 Hypothyroidism, unspecified: Secondary | ICD-10-CM | POA: Diagnosis not present

## 2021-12-14 DIAGNOSIS — R1312 Dysphagia, oropharyngeal phase: Secondary | ICD-10-CM | POA: Diagnosis not present

## 2021-12-14 DIAGNOSIS — Z9889 Other specified postprocedural states: Secondary | ICD-10-CM | POA: Diagnosis not present

## 2021-12-14 DIAGNOSIS — D649 Anemia, unspecified: Secondary | ICD-10-CM | POA: Diagnosis present

## 2021-12-14 DIAGNOSIS — R29701 NIHSS score 1: Secondary | ICD-10-CM | POA: Diagnosis present

## 2021-12-14 DIAGNOSIS — F0283 Dementia in other diseases classified elsewhere, unspecified severity, with mood disturbance: Secondary | ICD-10-CM | POA: Diagnosis present

## 2021-12-14 DIAGNOSIS — J312 Chronic pharyngitis: Secondary | ICD-10-CM | POA: Diagnosis present

## 2021-12-14 DIAGNOSIS — I6381 Other cerebral infarction due to occlusion or stenosis of small artery: Secondary | ICD-10-CM | POA: Diagnosis present

## 2021-12-14 DIAGNOSIS — Z7952 Long term (current) use of systemic steroids: Secondary | ICD-10-CM | POA: Diagnosis not present

## 2021-12-14 DIAGNOSIS — K5190 Ulcerative colitis, unspecified, without complications: Secondary | ICD-10-CM

## 2021-12-14 DIAGNOSIS — Z7902 Long term (current) use of antithrombotics/antiplatelets: Secondary | ICD-10-CM | POA: Diagnosis not present

## 2021-12-14 DIAGNOSIS — E2740 Unspecified adrenocortical insufficiency: Secondary | ICD-10-CM

## 2021-12-14 DIAGNOSIS — G309 Alzheimer's disease, unspecified: Secondary | ICD-10-CM | POA: Diagnosis present

## 2021-12-14 DIAGNOSIS — G459 Transient cerebral ischemic attack, unspecified: Secondary | ICD-10-CM | POA: Diagnosis present

## 2021-12-14 DIAGNOSIS — Z66 Do not resuscitate: Secondary | ICD-10-CM | POA: Diagnosis present

## 2021-12-14 DIAGNOSIS — I63312 Cerebral infarction due to thrombosis of left middle cerebral artery: Secondary | ICD-10-CM | POA: Diagnosis not present

## 2021-12-14 DIAGNOSIS — E890 Postprocedural hypothyroidism: Secondary | ICD-10-CM | POA: Diagnosis present

## 2021-12-14 DIAGNOSIS — G301 Alzheimer's disease with late onset: Secondary | ICD-10-CM | POA: Diagnosis not present

## 2021-12-14 DIAGNOSIS — I10 Essential (primary) hypertension: Secondary | ICD-10-CM | POA: Diagnosis present

## 2021-12-14 DIAGNOSIS — Z86018 Personal history of other benign neoplasm: Secondary | ICD-10-CM

## 2021-12-14 DIAGNOSIS — E785 Hyperlipidemia, unspecified: Secondary | ICD-10-CM | POA: Diagnosis present

## 2021-12-14 DIAGNOSIS — J45909 Unspecified asthma, uncomplicated: Secondary | ICD-10-CM | POA: Diagnosis present

## 2021-12-14 DIAGNOSIS — Z96643 Presence of artificial hip joint, bilateral: Secondary | ICD-10-CM | POA: Diagnosis present

## 2021-12-14 DIAGNOSIS — R29810 Facial weakness: Secondary | ICD-10-CM | POA: Diagnosis present

## 2021-12-14 DIAGNOSIS — I69351 Hemiplegia and hemiparesis following cerebral infarction affecting right dominant side: Secondary | ICD-10-CM | POA: Diagnosis not present

## 2021-12-14 DIAGNOSIS — R531 Weakness: Secondary | ICD-10-CM | POA: Diagnosis not present

## 2021-12-14 DIAGNOSIS — E739 Lactose intolerance, unspecified: Secondary | ICD-10-CM | POA: Diagnosis present

## 2021-12-14 DIAGNOSIS — F02A18 Dementia in other diseases classified elsewhere, mild, with other behavioral disturbance: Secondary | ICD-10-CM

## 2021-12-14 DIAGNOSIS — I69391 Dysphagia following cerebral infarction: Secondary | ICD-10-CM | POA: Diagnosis not present

## 2021-12-14 DIAGNOSIS — Z7982 Long term (current) use of aspirin: Secondary | ICD-10-CM | POA: Diagnosis not present

## 2021-12-14 DIAGNOSIS — K219 Gastro-esophageal reflux disease without esophagitis: Secondary | ICD-10-CM | POA: Diagnosis present

## 2021-12-14 DIAGNOSIS — R1310 Dysphagia, unspecified: Secondary | ICD-10-CM | POA: Diagnosis present

## 2021-12-14 LAB — BASIC METABOLIC PANEL
Anion gap: 12 (ref 5–15)
BUN: 11 mg/dL (ref 8–23)
CO2: 24 mmol/L (ref 22–32)
Calcium: 9.4 mg/dL (ref 8.9–10.3)
Chloride: 99 mmol/L (ref 98–111)
Creatinine, Ser: 0.87 mg/dL (ref 0.44–1.00)
GFR, Estimated: >60 mL/min (ref >60)
Glucose, Bld: 127 mg/dL — ABNORMAL HIGH (ref 70–99)
Potassium: 3.5 mmol/L (ref 3.5–5.1)
Sodium: 135 mmol/L (ref 135–145)

## 2021-12-14 LAB — CBC
HCT: 38.1 % (ref 36.0–46.0)
Hemoglobin: 12.6 g/dL (ref 12.0–15.0)
MCH: 29.2 pg (ref 26.0–34.0)
MCHC: 33.1 g/dL (ref 30.0–36.0)
MCV: 88.2 fL (ref 80.0–100.0)
Platelets: 276 10*3/uL (ref 150–400)
RBC: 4.32 MIL/uL (ref 3.87–5.11)
RDW: 14.7 % (ref 11.5–15.5)
WBC: 10.6 10*3/uL — ABNORMAL HIGH (ref 4.0–10.5)
nRBC: 0 % (ref 0.0–0.2)

## 2021-12-14 LAB — GLUCOSE, CAPILLARY: Glucose-Capillary: 172 mg/dL — ABNORMAL HIGH (ref 70–99)

## 2021-12-14 MED ORDER — ROSUVASTATIN CALCIUM 20 MG PO TABS
20.0000 mg | ORAL_TABLET | Freq: Every day | ORAL | Status: DC
  Administered 2021-12-14 – 2021-12-16 (×3): 20 mg via ORAL
  Filled 2021-12-14 (×3): qty 1

## 2021-12-14 MED ORDER — CLOPIDOGREL BISULFATE 75 MG PO TABS
300.0000 mg | ORAL_TABLET | Freq: Once | ORAL | Status: AC
  Administered 2021-12-14: 300 mg via ORAL
  Filled 2021-12-14: qty 4

## 2021-12-14 MED ORDER — LACTATED RINGERS IV BOLUS
500.0000 mL | Freq: Once | INTRAVENOUS | Status: AC
  Administered 2021-12-14: 500 mL via INTRAVENOUS

## 2021-12-14 MED ORDER — CLOPIDOGREL BISULFATE 75 MG PO TABS
75.0000 mg | ORAL_TABLET | Freq: Every day | ORAL | Status: DC
  Administered 2021-12-15 – 2021-12-16 (×2): 75 mg via ORAL
  Filled 2021-12-14 (×2): qty 1

## 2021-12-14 NOTE — Hospital Course (Addendum)
8/8: Patient opened eyes to verbal stimuli. Was able to say that she was in the hospital. Patient appears somnolent. Discussed plan to have her daughter come in today to see her.   PE: heart, crackles in the right base, no suprapubic tenderness --------------------------------------------------------------------------------------- Sierra [name] is a 88 year old female with past medical history of advanced Alzheimer disease, depression, hypertension, hypothyroidism, asthma, ulcerative colitis, chronic adrenal insufficiency is on prednisone who presents to the hospital for acute episode of right-sided weakness and facial drooping, admitted for management of CVA.   Acute lacunar infarct, capsular warning syndrome Right-sided weakness and facial droop Pt initially presented with right sided weakness and facial droop that seemed to resolve. CT head, MRI brain, CTA head and neck were unremarkable. Second day of admission, pt with redevelopment of facial droop, dysarthria, and trouble with word finding. Repeat MRI brain showing small acute infarct in the posterior limb left internal capsule. Chronic small vessel ischemia, brain atrophy, and left parietal encephalomalacia. Symptoms are consistent with capsular warning syndrome given transient symptoms. Started DAPT with Plavix '75mg'$  daily for 3 weeks and lifelong ASA '81mg'$  following neuro recommendations. Pt with a history of GI bleed 2/2 to UC will need further monitoring for bleeding risk. A1c 6.4. LDL 112, increased Crestor to '20mg'$ . Echo showing EF of 75%. Quinter. Mild to mod MVR. Mild MS. Mod. calcification of aortic valve. Held Amlodipine while in patient to allow for permissive hypertension, pt able to restart at discharge.   Hypertension Held amlodipine for while in patient to allow for permissive hypertension. Restart at discharge.   Hypothyroidism TSH 3.3. Continue levothyroxine 75 mcg   Chronic adrenal insufficiency Continue prednisone 5 mg daily    Chronic ulcerative colitis Continue mesalamine   Normocytic anemia Recent history of lower GI bleed Hemoglobin is stable. Patient denies signs of bleeding. Continue iron supplement.   Chronic stable asthma Continue ICS-LABA and albuterol as needed   Depression Continue Lexapro   Advanced Alzheimer's disease Continue memantine    Follow up and discharge Please follow up with your primary doctor following hospitalization for an acute stroke and further medication management.   Please follow up with Neurology at their stroke clinic at [facility]. Please call to set up an appointment.

## 2021-12-14 NOTE — Progress Notes (Signed)
Successful with meds crushed in applesauce;; did not attempt a liquid; await speech therapy to return; coughing reported overnight and this am after water.  Await reeval for further diet orders per MD.

## 2021-12-14 NOTE — Progress Notes (Signed)
Speech Language Pathology Treatment: Dysphagia  Patient Details Name: Sierra [name] MRN: [id number] DOB: [birth year] Today's Date: 12/14/2021 Time: 7564-3329 SLP Time Calculation (min) (ACUTE ONLY): 20 min  Assessment / Plan / Recommendation Clinical Impression  Pt was seen for dysphagia treatment. She was alert and cooperative during the session. Pt's RN, Levada Dy, reported pt having significant "strangling" with thin liquids earlier today despite optimal positioning and reduced bolus sizes. No difficulty was reported with the small amounts of breakfast consumed or with meds which were crushed and given in puree. Mastication of dysphagia 3 solids was prolonged with mild-moderate oral residue. Pt tolerated dysphagia 2 and nectar thick liquids via straw without overt s/sx of aspiration, but significant coughing was noted with thin liquids via cup. A modified barium swallow study is recommended to further assess physiology, but it cannot be conducted today. A dysphagia 2 diet with nectar thick liquids will be initiated at this time with plan for subsequent instrumental assessment.    HPI HPI: 88 y/o female admitted from SNF secondary to R sided weakness. MRI of the brain 8/5 was negative for any acute findings. Increased weakness noted on 8/6; repeat MRI brain 8/6: Small acute infarct in the posterior limb left internal capsule. Coughing noted with thin liquids on 8/6 and pt made NPO thereafter. PMH including but not limited to advanced Alzheimer disease, depression, hypertension, hypothyroidism, asthma, ulcerative colitis, chronic adrenal insufficiency is on prednisone      SLP Plan  Continue with current plan of care      Recommendations for follow up therapy are one component of a multi-disciplinary discharge planning process, led by the attending physician.  Recommendations may be updated based on patient status, additional functional criteria and insurance authorization.    Recommendations   Diet recommendations: Dysphagia 2 (fine chop);Nectar-thick liquid Liquids provided via: Cup;Straw Medication Administration: Crushed with puree Supervision: Staff to assist with self feeding Compensations: Slow rate;Small sips/bites;Follow solids with liquid Postural Changes and/or Swallow Maneuvers: Seated upright 90 degrees                Oral Care Recommendations: Oral care BID Follow Up Recommendations: Skilled nursing-short term rehab (<3 hours/day) Assistance recommended at discharge: Frequent or constant Supervision/Assistance SLP Visit Diagnosis: Dysphagia, oral phase (R13.11);Dysphagia, unspecified (R13.10) Plan: Continue with current plan of care         Gavyn Ybarra I. Hardin Negus, [place], [place] Office number [place]  12/14/2021, 2:16 PM

## 2021-12-14 NOTE — Progress Notes (Signed)
Patient evaluated this morning at bedside.  She has worsening facial droop and right arm weakness.  RN reports that patient had similar episode last night with complete resolution of her symptoms in the morning.  BP 152/85, HR 94, RR 18, O2 91% on RA. CBG 172  Patient was unable to raise her right arm against gravity with grip strength 2/5.  4/5 strength of RLE. She has 5/5 strength of LUE and LLE.  Patient also appears in mild respiratory distress with tachypnea and upper airway sound.  Patient has no hypotension event overnight or this morning.  CBG within normal limits.  I called and discussed with Dr. Erlinda Hong about her acute neurological symptoms.  He does not recommend calling a code stroke but repeat a stat MRI.  We will lower the head of her bed as much as we can but patient experiences aspiration event when head of bed is flat.  Give 500 cc bolus LR.  Per note, patient has had a coughing episode yesterday and also this morning.  Given her new oxygen requirement, obtain chest x-ray to look for either aspiration pneumonia or pneumonitis.  She will need SLP reevaluation today as well.  N.p.o. at this time

## 2021-12-14 NOTE — Progress Notes (Addendum)
STROKE TEAM PROGRESS NOTE   SUBJECTIVE (INTERVAL HISTORY) Her RN is at the bedside. Per RN and primary team, patient has fluctuation right-sided weakness overnight.  Currently patient still has right facial droop and right arm and leg hemiparesis.  BP stable, no hypotensive events.  Repeat MRI showed left internal capsule small infarct.  Symptoms consistent with capsular warning syndrome.  Patient still has dysphagia, with lying flat or thin liquid patient will have choking and coughing.  Currently able to take medication with applesauce.   OBJECTIVE Temp:  [98 F (36.7 C)-99 F (37.2 C)] 98 F (36.7 C) (08/06 1146) Pulse Rate:  [77-92] 90 (08/06 1146) Cardiac Rhythm: Normal sinus rhythm;Other (Comment);Bundle branch block (08/06 0659) Resp:  [14-24] 19 (08/06 1146) BP: (125-172)/(74-92) 172/83 (08/06 1146) SpO2:  [90 %-96 %] 92 % (08/06 1156)  Recent Labs  Lab 12/13/21 0656 12/14/21 0848  GLUCAP 118* 172*   Recent Labs  Lab 12/13/21 0701 12/13/21 0702 12/14/21 0432  NA 137 139 135  K 3.5 3.9 3.5  CL 102 101 99  CO2 25  --  24  GLUCOSE 121* 120* 127*  BUN '13 19 11  '$ CREATININE 0.87 0.80 0.87  CALCIUM 9.1  --  9.4   Recent Labs  Lab 12/13/21 0701  AST 29  ALT 34  ALKPHOS 39  BILITOT 0.6  PROT 7.0  ALBUMIN 3.4*   Recent Labs  Lab 12/13/21 0701 12/13/21 0702 12/13/21 1055 12/13/21 1644 12/14/21 0432  WBC 11.5*  --   --   --  10.6*  NEUTROABS 8.6*  --   --   --   --   HGB 11.7* 12.2 12.1 11.7* 12.6  HCT 37.1 36.0 37.4 37.0 38.1  MCV 91.6  --   --   --  88.2  PLT 269  --   --   --  276   No results for input(s): "CKTOTAL", "CKMB", "CKMBINDEX", "TROPONINI" in the last 168 hours. Recent Labs    12/13/21 0701  LABPROT 14.2  INR 1.1   No results for input(s): "COLORURINE", "LABSPEC", "PHURINE", "GLUCOSEU", "HGBUR", "BILIRUBINUR", "KETONESUR", "PROTEINUR", "UROBILINOGEN", "NITRITE", "LEUKOCYTESUR" in the last 72 hours.  Invalid input(s): "APPERANCEUR"      Component Value Date/Time   CHOL 205 (H) 12/13/2021 1055   TRIG 245 (H) 12/13/2021 1055   HDL 44 12/13/2021 1055   CHOLHDL 4.7 12/13/2021 1055   VLDL 49 (H) 12/13/2021 1055   LDLCALC 112 (H) 12/13/2021 1055   LDLCALC 133 (H) 02/01/2017 1128   Lab Results  Component Value Date   HGBA1C 6.4 (H) 12/13/2021   No results found for: "LABOPIA", "COCAINSCRNUR", "LABBENZ", "AMPHETMU", "THCU", "LABBARB"  Recent Labs  Lab 12/13/21 0700  ETH <10    I have personally reviewed the radiological images below and agree with the radiology interpretations.  DG Chest 2 View  Result Date: 12/14/2021 CLINICAL DATA:  Shortness of breath. EXAM: CHEST - 2 VIEW COMPARISON:  12/13/2021 FINDINGS: Stable cardiomediastinal contours. Aortic atherosclerotic calcifications. Unchanged asymmetric elevation of the right hemidiaphragm. Chronic interstitial coarsening identified bilaterally. No signs of pleural effusion, interstitial edema or airspace consolidation. IMPRESSION: No active cardiopulmonary abnormalities. Electronically Signed   By: Kerby Moors M.D.   On: 12/14/2021 11:03   MR BRAIN WO CONTRAST  Result Date: 12/14/2021 CLINICAL DATA:  Neuro deficit with acute stroke suspected. EXAM: MRI HEAD WITHOUT CONTRAST TECHNIQUE: Multiplanar, multiecho pulse sequences of the brain and surrounding structures were obtained without intravenous contrast. COMPARISON:  Yesterday FINDINGS: Brain: An  acute small vessel infarct has become apparent in the posterior limb left internal capsule. Chronic small vessel ischemia in the cerebral white matter and pons. Chronic lacunar infarct in the left pons. Large area of encephalomalacia centered in the left low parietal region deep to a craniotomy. Generalized brain atrophy. Vascular: Normal flow voids. Skull and upper cervical spine: Unremarkable remote left parietal craniotomy. No focal marrow signal abnormality. Sinuses/Orbits: Bilateral cataract resection. IMPRESSION: 1. Small  acute infarct in the posterior limb left internal capsule. 2. Chronic small vessel ischemia, brain atrophy, and left parietal encephalomalacia. Electronically Signed   By: Jorje Guild M.D.   On: 12/14/2021 10:40   ECHOCARDIOGRAM COMPLETE  Result Date: 12/13/2021    ECHOCARDIOGRAM REPORT   Patient Name:   [name] Date of Exam: 12/13/2021 Medical Rec #:  [id number]      Height:       63.0 in Accession #:    [id number]     Weight:       155.6 lb Date of Birth:  1928-09-10     BSA:          1.738 m Patient Age:    6 years       BP:           153/75 mmHg Patient Gender: F              HR:           86 bpm. Exam Location:  Inpatient Procedure: 2D Echo, Cardiac Doppler and Color Doppler                              MODIFIED REPORT: This report was modified by Cherlynn Kaiser MD on 12/13/2021 due to revision.  Indications:     Stroke  History:         Patient has prior history of Echocardiogram examinations, most                  recent 12/17/2015. Risk Factors:Dyslipidemia and Hypertension.  Sonographer:     Jefferey Pica Referring Phys:  [id number] Velna Ochs Diagnosing Phys: Cherlynn Kaiser MD IMPRESSIONS  1. Left ventricular ejection fraction, by estimation, is 70 to 75%. The left ventricle has hyperdynamic function. The left ventricle has no regional wall motion abnormalities. There is moderate left ventricular hypertrophy. Left ventricular diastolic parameters are indeterminate.  2. Right ventricular systolic function is normal. The right ventricular size is normal.  3. The mitral valve is degenerative. Mild to moderate mitral valve regurgitation. Mild mitral stenosis. The mean mitral valve gradient is 5.0 mmHg with average heart rate of 87 bpm. Severe mitral annular calcification.  4. The aortic valve is grossly normal. There is moderate calcification of the aortic valve. Aortic valve regurgitation is trivial. No aortic stenosis is present. FINDINGS  Left Ventricle: Left ventricular ejection fraction,  by estimation, is 70 to 75%. The left ventricle has hyperdynamic function. The left ventricle has no regional wall motion abnormalities. The left ventricular internal cavity size was normal in size. There is moderate left ventricular hypertrophy. Left ventricular diastolic parameters are indeterminate. Right Ventricle: The right ventricular size is normal. No increase in right ventricular wall thickness. Right ventricular systolic function is normal. Left Atrium: Left atrial size was normal in size. Right Atrium: Right atrial size was normal in size. Pericardium: There is no evidence of pericardial effusion. Presence of epicardial fat layer. Mitral Valve: The mitral valve is degenerative  in appearance. Severe mitral annular calcification. Mild to moderate mitral valve regurgitation. Mild mitral valve stenosis. MV peak gradient, 15.8 mmHg. The mean mitral valve gradient is 5.0 mmHg with average heart rate of 87 bpm. Tricuspid Valve: The tricuspid valve is normal in structure. Tricuspid valve regurgitation is mild . No evidence of tricuspid stenosis. Aortic Valve: The aortic valve is grossly normal. There is moderate calcification of the aortic valve. Aortic valve regurgitation is trivial. No aortic stenosis is present. Aortic valve mean gradient measures 7.5 mmHg. Aortic valve peak gradient measures  13.8 mmHg. Aortic valve area, by VTI measures 1.91 cm. Pulmonic Valve: The pulmonic valve was normal in structure. Pulmonic valve regurgitation is trivial. No evidence of pulmonic stenosis. Aorta: The aortic root is normal in size and structure. Ascending aorta measurements are within normal limits for age when indexed to body surface area. Venous: The inferior vena cava was not well visualized. IAS/Shunts: The interatrial septum was not well visualized.  LEFT VENTRICLE PLAX 2D LVIDd:         3.50 cm   Diastology LVIDs:         2.40 cm   LV e' lateral:   4.78 cm/s LV PW:         1.50 cm   LV E/e' lateral: 18.7 LV IVS:         1.40 cm LVOT diam:     1.80 cm LV SV:         67 LV SV Index:   39 LVOT Area:     2.54 cm  RIGHT VENTRICLE RV Basal diam:  2.70 cm RV S prime:     13.30 cm/s TAPSE (M-mode): 1.9 cm LEFT ATRIUM             Index        RIGHT ATRIUM           Index LA diam:        3.00 cm 1.73 cm/m   RA Area:     10.40 cm LA Vol (A2C):   53.4 ml 30.72 ml/m  RA Volume:   24.50 ml  14.10 ml/m LA Vol (A4C):   49.5 ml 28.48 ml/m LA Biplane Vol: 51.9 ml 29.86 ml/m  AORTIC VALVE                     PULMONIC VALVE AV Area (Vmax):    2.25 cm      PV Vmax:       1.12 m/s AV Area (Vmean):   2.00 cm      PV Peak grad:  5.0 mmHg AV Area (VTI):     1.91 cm AV Vmax:           185.69 cm/s AV Vmean:          126.528 cm/s AV VTI:            0.351 m AV Peak Grad:      13.8 mmHg AV Mean Grad:      7.5 mmHg LVOT Vmax:         164.00 cm/s LVOT Vmean:        99.200 cm/s LVOT VTI:          0.264 m LVOT/AV VTI ratio: 0.75  AORTA Ao Root diam: 3.30 cm Ao Asc diam:  3.70 cm MITRAL VALVE                TRICUSPID VALVE MV Area (PHT): 2.46 cm  TR Peak grad:   25.0 mmHg MV Peak grad:  15.8 mmHg    TR Vmax:        250.00 cm/s MV Mean grad:  5.0 mmHg MV Vmax:       1.98 m/s     SHUNTS MV Vmean:      102.5 cm/s   Systemic VTI:  0.26 m MV Decel Time: 309 msec     Systemic Diam: 1.80 cm MV E velocity: 89.30 cm/s MV A velocity: 164.00 cm/s MV E/A ratio:  0.54 Cherlynn Kaiser MD Electronically signed by Cherlynn Kaiser MD Signature Date/Time: 12/13/2021/7:29:15 PM    Final (Updated)    CT ANGIO HEAD NECK W WO CM  Result Date: 12/13/2021 CLINICAL DATA:  Stroke follow-up EXAM: CT ANGIOGRAPHY HEAD AND NECK TECHNIQUE: Multidetector CT imaging of the head and neck was performed using the standard protocol during bolus administration of intravenous contrast. Multiplanar CT image reconstructions and MIPs were obtained to evaluate the vascular anatomy. Carotid stenosis measurements (when applicable) are obtained utilizing NASCET criteria, using the distal  internal carotid diameter as the denominator. RADIATION DOSE REDUCTION: This exam was performed according to the departmental dose-optimization program which includes automated exposure control, adjustment of the mA and/or kV according to patient size and/or use of iterative reconstruction technique. CONTRAST:  41m OMNIPAQUE IOHEXOL 350 MG/ML SOLN COMPARISON:  Brain MRI from earlier today FINDINGS: CTA NECK FINDINGS Aortic arch: Atheromatous plaque.  Two vessel branching. Right carotid system: Mild atheromatous calcification at the bifurcation. No stenosis or ulceration. Left carotid system: Mild calcified plaque at the bifurcation. No stenosis or ulceration. Vertebral arteries: No proximal subclavian stenosis or ulceration. Atheromatous plaque at the left vertebral origin. No vertebral stenosis or beading Skeleton: Advanced and generalized cervical spine degeneration. No acute or focal finding. Unremarkable left parietal craniotomy Other neck: No acute finding Upper chest: No acute finding Review of the MIP images confirms the above findings CTA HEAD FINDINGS Anterior circulation: Atheromatous calcification of the carotid siphons. Advanced atheromatous irregularity of the MCA branches with diffuse mild to moderate narrowings bilaterally. The right A1 segment is small and hypoplastic appearing. Negative for aneurysm Posterior circulation: Vertebral and basilar arteries are diffusely patent. Atheromatous irregularity of bilateral posterior cerebral arteries. Negative for aneurysm Venous sinuses: Unremarkable Anatomic variants: Unremarkable Review of the MIP images confirms the above findings IMPRESSION: 1. No emergent finding. 2. Atherosclerosis with advanced intracranial branch involvement. No proximal/correctable flow reducing stenosis. Electronically Signed   By: JJorje GuildM.D.   On: 12/13/2021 10:14   DG Chest Port 1 View  Result Date: 12/13/2021 CLINICAL DATA:  Cough. EXAM: PORTABLE CHEST 1 VIEW  COMPARISON:  04/17/2020 FINDINGS: 0943 hours. Stable asymmetric elevation right hemidiaphragm. The cardio pericardial silhouette is enlarged. Interstitial markings are diffusely coarsened with chronic features. Bones are diffusely demineralized. Telemetry leads overlie the chest. IMPRESSION: Stable exam. No acute cardiopulmonary findings. Electronically Signed   By: EMisty StanleyM.D.   On: 12/13/2021 10:12   MR BRAIN WO CONTRAST  Result Date: 12/13/2021 CLINICAL DATA:  Right-sided weakness EXAM: MRI HEAD WITHOUT CONTRAST TECHNIQUE: Multiplanar, multiecho pulse sequences of the brain and surrounding structures were obtained without intravenous contrast. COMPARISON:  Head CT from earlier today FINDINGS: Brain: No acute infarction, hemorrhage, hydrocephalus, extra-axial collection or mass lesion. Chronic small vessel ischemia in the hemispheric white matter and pons. Brain atrophy that is generalized. Chronic left parietal and upper occipital encephalomalacia beneath the craniotomy site. Vascular: Normal flow voids. Skull and upper cervical spine:  Unremarkable remote left parietal craniotomy. Sinuses/Orbits: Bilateral cataract resection IMPRESSION: No acute finding.  Stable atrophy and encephalomalacia. Electronically Signed   By: Jorje Guild M.D.   On: 12/13/2021 10:03   CT HEAD CODE STROKE WO CONTRAST  Result Date: 12/13/2021 CLINICAL DATA:  Code stroke. Neuro deficit with acute stroke suspected. Right-sided weakness EXAM: CT HEAD WITHOUT CONTRAST TECHNIQUE: Contiguous axial images were obtained from the base of the skull through the vertex without intravenous contrast. RADIATION DOSE REDUCTION: This exam was performed according to the departmental dose-optimization program which includes automated exposure control, adjustment of the mA and/or kV according to patient size and/or use of iterative reconstruction technique. COMPARISON:  12/31/2019 FINDINGS: Brain: No evidence of acute infarction, hemorrhage,  hydrocephalus, extra-axial collection or mass lesion/mass effect. Encephalomalacia is noted in the left parietal lobe deep to a prior craniotomy. Generalized brain atrophy with chronic small vessel ischemia. Chronic lacunar infarct at the left thalamus when compared to prior. Vascular: No hyperdense vessel or unexpected calcification. Skull: Unremarkable left-sided craniotomy. Sinuses/Orbits: Bilateral cataract resection. Atheromatous calcification Other: Findings directly communicated with Dr. Lorrin Goodell at 7:08 am on 12/13/2021. ASPECTS ([facility] Stroke Program Early CT Score) - Ganglionic level infarction (caudate, lentiform nuclei, internal capsule, insula, M1-M3 cortex): 7 - Supraganglionic infarction (M4-M6 cortex): 3-when accounting for chronic infarct Total score (0-10 with 10 being normal): 10 IMPRESSION: 1. Stable head CT.  No acute finding. 2. Chronic small vessel disease and left parietal encephalomalacia. 3. Generalized brain atrophy. Electronically Signed   By: Jorje Guild M.D.   On: 12/13/2021 07:09   CT Abdomen Pelvis W Contrast  Result Date: 11/20/2021 CLINICAL DATA:  Abdominal pain, acute, nonlocalized EXAM: CT ABDOMEN AND PELVIS WITH CONTRAST TECHNIQUE: Multidetector CT imaging of the abdomen and pelvis was performed using the standard protocol following bolus administration of intravenous contrast. RADIATION DOSE REDUCTION: This exam was performed according to the departmental dose-optimization program which includes automated exposure control, adjustment of the mA and/or kV according to patient size and/or use of iterative reconstruction technique. CONTRAST:  147m OMNIPAQUE IOHEXOL 300 MG/ML  SOLN COMPARISON:  CT abdomen pelvis 08/25/2019 FINDINGS: Lower chest: Bibasilar atelectasis/scarring. Extensive mitral annular calcifications. Aortic valve calcifications. Hepatobiliary: Severe diffuse hepatic steatosis. Focal fatty sparing along the gallbladder fossa. There is a small adjacent  hepatic cyst measuring 1.2 cm. The gallbladder is unremarkable. Pancreas: There is a 3.9 x 3.1 cm cystic lesion in the pancreatic body, which previously measured up to 3.5 x 2.9 cm in April 2021. There is a nodular enhancing lesion in the pancreatic tail measuring up to 1.0 cm, previously 1.2 cm. Spleen: Normal in size with multiple low-density lesions again seen, likely small cysts or hemangiomas. Adrenals/Urinary Tract: Adrenal glands are unremarkable. No hydronephrosis. There is a nonobstructive 4 mm stone in the right lower pole. Multiple small cystic lesions in the kidneys bilaterally, most of which are too small to characterize. There is a notable exophytic right upper pole renal cyst measuring 0.8 cm which is more dense in comparison to prior exam, measuring up to 93 Hounsfield units, previously 25 Hounsfield units. This persists on delayed without significant washout. This likely reflects a now hemorrhagic or proteinaceous cyst, as there is no significant change in size in comparison to prior exam in April 2021. No follow-up imaging is recommended. Bladder is markedly distended with some intraluminal gas. Stomach/Bowel: The stomach is within normal limits. There is no evidence of bowel obstruction.Prior appendectomy. Colonic diverticulosis. No evidence of acute diverticulitis. Vascular/Lymphatic: No lymphadenopathy. Aortoiliac  atherosclerosis. No AAA. Reproductive: Prior hysterectomy. Other: No hernia. No ascites. No free air. Atrophic anterior abdominal wall musculature. Musculoskeletal: There is a T11 compression fracture with approximately 50% height loss, reported on prior MRI 10/01/2020 with 10-20% height loss at that time (images not retrievable for direct comparison). Unchanged height loss of L5. There are multilevel degenerative changes of the spine with trace retrolisthesis at L4-L5. Total hip arthroplasties bilaterally are unchanged. IMPRESSION: Marked distension of the urinary bladder with  intraluminal gas, correlate with history of need for bladder catheterizations. Colonic diverticulosis without evidence of acute diverticulitis. Hepatic steatosis with fatty sparing along the gallbladder fossa. Slightly increased size of a cystic lesion in the pancreatic body measuring up to 3.9 x 3.1 cm, previously 3.5 x 2.9 cm in April 2021. No significant change in the small hyperdense mass in the pancreatic tail, which measures up to 1.0 cm. Chronic T11 compression fracture with approximately 50% height loss, reported on prior MRI 10/01/2020 with 10-20% height loss at that time (images not retrievable for direct comparison), suggesting progressive height loss. Additional chronic findings as described above. Electronically Signed   By: Maurine Simmering M.D.   On: 11/20/2021 11:12     PHYSICAL EXAM  Temp:  [98 F (36.7 C)-99 F (37.2 C)] 98 F (36.7 C) (08/06 1146) Pulse Rate:  [77-92] 90 (08/06 1146) Resp:  [14-24] 19 (08/06 1146) BP: (125-172)/(74-92) 172/83 (08/06 1146) SpO2:  [90 %-96 %] 92 % (08/06 1156)  General - Well nourished, well developed, in no apparent distress.  Ophthalmologic - fundi not visualized due to noncooperation.  Cardiovascular - Regular rhythm and rate.  Neuro - awake, alert, eyes open, orientated to place, but not to time or age. No aphasia, hard of hearing, but answer questions appropriately, following all simple commands. Able to name and repeat. No gaze palsy, tracking bilaterally, visual field full. Mild right nasolabial fold flattening. Tongue midline. RUE 3/5 with drift, LUE no drift. LLE no drift, but RLE 3/5 with drift and ankle DF/PF 3-/5 compared with 4+/5 on the left. Sensation symmetrical bilaterally subjectively, left FTN intact, gait not tested.     ASSESSMENT/PLAN Ms. Sierra [name] is a 88 y.o. female with history of hypertension, hyperlipidemia, dementia, ulcerative colitis, adrenal insufficiency on prednisone, GI bleeding admitted for right-sided  weakness and right facial droop. No tPA given due to rapid resolved symptoms.    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  Continue DAPT for 3 weeks and then aspirin. Ongoing aggressive stroke risk factor management Therapy recommendations: SNF Disposition: Pending  Hypertension Stable Avoid low BP gradually normalized within 5-7 days. Long term BP goal normotensive  Hyperlipidemia Home meds: None LDL 112, goal < 70 Now on Crestor 20 Continue statin at discharge  Dysphagia Pending speech reevaluation Choking and coughing if lying flat or with thin liquid Currently able to take medication with applesauce. Consider IVF if not adequate intake  Other Stroke Risk Factors Advanced age  Other Active Problems Ulcerative colitis, on mesalamine Adrenal insufficiency on prednisone Dementia on Namenda Recent rectal bleeding, but H&H stable at this time History of craniotomy, due  to ? Meningioma  Hospital day # 0  Rosalin Hawking, MD PhD Stroke Neurology 12/14/2021 12:46 PM  Patient condition worsened/fluctuation within the last 24 hours, has developed persistent right hemiparesis.  MRI confirmed stroke.  Discussed with primary team.  I spent extensive time with the patient, more than 50% of which was spent in counseling and coordination of care, reviewing test results, images and medication, and discussing the diagnosis, treatment plan and potential prognosis. This patient's care requiresreview of multiple databases, neurological assessment, discussion with family, other specialists and medical decision making  of high complexity.  To contact Stroke Continuity provider, please refer to [email or web address] After hours, contact General Neurology

## 2021-12-14 NOTE — Evaluation (Signed)
Occupational Therapy Evaluation Patient Details Name: Sierra [name] MRN: [id number] DOB: 01/08/1929 Today's Date: 12/14/2021   History of Present Illness Pt is a 88 y/o female admitted from SNF secondary to R sided weakness. MRI of the brain was negative for any acute findings. PMH including but not limited to advanced Alzheimer disease, depression, hypertension, hypothyroidism, asthma, ulcerative colitis, chronic adrenal insufficiency is on prednisone.   Clinical Impression   Pt PTA: Pt from SNF per chart. Pt currently, slightly assisting for rolling in bed requiring maxA+2. Pt with new CVA symptoms on R side (R am movement decreased and R facial droop) RN aware. MaxA to [place] +2 for ADL tasks in bed. Pt's RUE 2-/5 strength shoulder flex 10*; 2/5 hand squeeze. Pt following 1 step commands with facial grimacing with rolling side to side. Assessment short due to stat MRI ordered. Pt requires continued OT skilled services. OT following acutely.     Recommendations for follow up therapy are one component of a multi-disciplinary discharge planning process, led by the attending physician.  Recommendations may be updated based on patient status, additional functional criteria and insurance authorization.   Follow Up Recommendations  Skilled nursing-short term rehab (<3 hours/day)    Assistance Recommended at Discharge Frequent or constant Supervision/Assistance  Patient can return home with the following A lot of help with walking and/or transfers;A lot of help with bathing/dressing/bathroom;Assist for transportation;Help with stairs or ramp for entrance;Direct supervision/assist for medications management;Direct supervision/assist for financial management    Functional Status Assessment  Patient has had a recent decline in their functional status and/or demonstrates limited ability to make significant improvements in function in a reasonable and predictable amount of time  Equipment  Recommendations  None recommended by OT    Recommendations for Other Services       Precautions / Restrictions Precautions Precautions: Fall Precaution Comments: incontinent Restrictions Weight Bearing Restrictions: No      Mobility Bed Mobility Overal bed mobility: Needs Assistance Bed Mobility: Rolling, Supine to Sit, Sit to Supine Rolling: Min assist   Supine to sit: Mod assist Sit to supine: Max assist, +2 for physical assistance   General bed mobility comments: increased time and effort, multimodal cueing required, assistance needed for trunk management and bilateral LE management off of and back onto bed    Transfers                   General transfer comment: unable to perform as pt began choking on her water and pill while sitting EOB with mod-max A and RN requesting to return to bed to be able to attempt giving pt her other meds (crushed with applesauce)      Balance                                           ADL either performed or assessed with clinical judgement   ADL Overall ADL's : Needs assistance/impaired Eating/Feeding: NPO Eating/Feeding Details (indicate cue type and reason): pt coughing, no food to be eaten at this time Grooming: Maximal assistance;Bed level   Upper Body Bathing: Maximal assistance;Bed level   Lower Body Bathing: Total assistance;+2 for physical assistance;+2 for safety/equipment;Bed level   Upper Body Dressing : Maximal assistance;Bed level   Lower Body Dressing: Total assistance;+2 for physical assistance;+2 for safety/equipment;Bed level   Toilet Transfer: Total assistance;+2 for physical assistance;+2 for safety/equipment Toilet Transfer  Details (indicate cue type and reason): DNT Toileting- Clothing Manipulation and Hygiene: Total assistance;+2 for physical assistance;+2 for safety/equipment Toileting - Clothing Manipulation Details (indicate cue type and reason): using pure wick     Functional  mobility during ADLs: Total assistance;+2 for physical assistance;+2 for safety/equipment General ADL Comments: Pt slightly assisting for rolling in bed. Pt with new CVA symptoms on R side (R am movement decreased and R facial droop) RN aware. MaxA to [place] +2 for ADL tasks in bed.     Vision Patient Visual Report: Other (comment) (scanning; appears to be at baseline) Vision Assessment?: No apparent visual deficits     Perception     Praxis      Pertinent Vitals/Pain Pain Assessment Pain Assessment: Faces Faces Pain Scale: Hurts little more Pain Location: rolling to R side Pain Descriptors / Indicators: Discomfort Pain Intervention(s): Monitored during session, Repositioned     Hand Dominance Right   Extremity/Trunk Assessment Upper Extremity Assessment Upper Extremity Assessment: RUE deficits/detail RUE Deficits / Details: 2-/5 strength shoulder flex 10*; 2/5 hand squeeze RUE Sensation: WNL RUE Coordination: decreased fine motor;decreased gross motor   Lower Extremity Assessment Lower Extremity Assessment: RLE deficits/detail;Defer to PT evaluation RLE Deficits / Details: AROM, WFLs   Cervical / Trunk Assessment Cervical / Trunk Assessment: Kyphotic   Communication Communication Communication: HOH;Expressive difficulties   Cognition Arousal/Alertness: Awake/alert Behavior During Therapy: Flat affect Overall Cognitive Status: History of cognitive impairments - at baseline Area of Impairment: Orientation, Attention, Memory, Following commands, Safety/judgement, Awareness, Problem solving                 Orientation Level: Disoriented to, Place, Time, Situation Current Attention Level: Focused Memory: Decreased short-term memory Following Commands: Follows one step commands inconsistently, Follows one step commands with increased time Safety/Judgement: Decreased awareness of deficits, Decreased awareness of safety Awareness: Intellectual Problem Solving: Slow  processing, Decreased initiation, Difficulty sequencing, Requires verbal cues, Requires tactile cues General Comments: Pt following commands with delay. Pt saying "I love you" after meeting this OT. Pt hears better in R ear.     General Comments  Pt rolling side to side for pericare management. MD requesting stat MRI due to new symptoms on R side and pt to remain in bed.    Exercises     Shoulder Instructions      Home Living Family/patient expects to be discharged to:: Skilled nursing facility                                 Additional Comments: per chart review, pt is from SNF      Prior Functioning/Environment Prior Level of Function : Patient poor historian/Family not available             Mobility Comments: per chart review, pt is from a SNF and was ambulatory with assistance ADLs Comments: requiring assist        OT Problem List: Decreased activity tolerance;Decreased strength;Decreased range of motion;Impaired balance (sitting and/or standing);Impaired vision/perception;Decreased coordination;Decreased cognition;Decreased safety awareness;Impaired UE functional use;Pain;Increased edema      OT Treatment/Interventions: Self-care/ADL training;Therapeutic exercise;Neuromuscular education;DME and/or AE instruction;Cognitive remediation/compensation;Therapeutic activities;Patient/family education;Balance training    OT Goals(Current goals can be found in the care plan section) Acute Rehab OT Goals Patient Stated Goal: unable to state OT Goal Formulation: Patient unable to participate in goal setting Potential to Achieve Goals: Fair ADL Goals Pt Will Perform Eating: with set-up;sitting;bed level Pt Will  Perform Grooming: with min assist;sitting;bed level Pt Will Transfer to Toilet: with max assist;with +2 assist;squat pivot transfer;bedside commode Pt/caregiver will Perform Home Exercise Program: Increased ROM;Increased strength;Right Upper extremity;With  written HEP provided  OT Frequency: Min 2X/week    Co-evaluation              AM-PAC OT "6 Clicks" Daily Activity     Outcome Measure Help from another person eating meals?: Total Help from another person taking care of personal grooming?: Total Help from another person toileting, which includes using toliet, bedpan, or urinal?: Total Help from another person bathing (including washing, rinsing, drying)?: Total Help from another person to put on and taking off regular upper body clothing?: A Lot Help from another person to put on and taking off regular lower body clothing?: Total 6 Click Score: 7   End of Session Nurse Communication: Mobility status  Activity Tolerance: Patient tolerated treatment well Patient left: in bed;with call bell/phone within reach;with bed alarm set;with nursing/sitter in room  OT Visit Diagnosis: Unsteadiness on feet (R26.81);Muscle weakness (generalized) (M62.81)                Time: 0100-7121 OT Time Calculation (min): 24 min Charges:  OT General Charges $OT Visit: 1 Visit OT Evaluation $OT Eval Moderate Complexity: 1 Mod OT Treatments $Self Care/Home Management : 8-22 mins  Jefferey Pica, OTR/L Acute Rehabilitation Services Office: [phone]   IYMEBRA C 12/14/2021, 10:04 AM

## 2021-12-14 NOTE — Progress Notes (Addendum)
Subjective:  Patient denies having pain at this time. Patient able to state that we were holding a pen and watch but not a cell phone. She is able to recall daughter's name. She states she was able to eat yesterday.  Objective:  Vital signs in last 24 hours: Vitals:   12/14/21 0320 12/14/21 0720 12/14/21 0754 12/14/21 0855  BP: (!) 168/74  (!) 145/81 (!) 152/85  Pulse: 82 80 88 92  Resp: '17 16 19 18  '$ Temp: 98.3 F (36.8 C)  98.1 F (36.7 C) 99 F (37.2 C)  TempSrc: Oral  Oral Oral  SpO2: 94% 95% 90% 94%  Weight:      Height:       Weight change:   Intake/Output Summary (Last 24 hours) at 12/14/2021 9326 Last data filed at 12/13/2021 2301 Gross per 24 hour  Intake 300 ml  Output 400 ml  Net -100 ml   Physical Exam General: Elderly appearing woman lying in bed, in no acute distress HEENT: Normocephalic, atraumatic, EOM intact, conjunctiva normal CV: Regular rate and rhythm, no murmurs rubs or gallops Pulm: Clear to auscultation bilaterally, normal respiratory effort Abdomen: Soft, non tender. Normal BS. MSK: Moving all extremities equally. No edema. Distal pulses 2+ Skin: Warm and dry. No rash Neuro: Alert and oriented x2 to person and place. Strength was 4/5. Sensation intact. Dysathria noted. Tongue protrudes to right. Facial droop noted to left side. Eyebrow raise equal. Psych: Mood and Affect are normal  Assessment/Plan:  Principal Problem:   Acute right-sided weakness Active Problems:   HTN (hypertension)   Hypothyroidism   Adrenal insufficiency (HCC)   Asthma, chronic   Dementia without behavioral disturbance ([place])  Sierra [name] is a 88 year old female with past medical history of advanced Alzheimer disease, depression, hypertension, hypothyroidism, asthma, ulcerative colitis, chronic adrenal insufficiency is on prednisone who presents to the hospital for acute episode of right-sided weakness and facial drooping, admitted for work-up of CVA vs TIA.   Acute  lacunar infarct, capsular warning syndrome Right-sided weakness and facial droop Pt initially presented with right sided weakness and facial droop that seemed to resolve. CT head, MRI brain, CTA head and neck were unremarkable. Today, pt with facial droop, dysarthria, and trouble with word finding. Repeat MRI brain today showing small acute infarct in the posterior limb left internal capsule. Chronic small vessel ischemia, brain atrophy, and left parietal encephalomalacia. Symptoms are consistent with capsular warning syndrome given transient symptoms. Started ASA yesterday likely to start DAPT today following neuro recommendations. Pt with a history of GI bleed 2/2 to UC. A1c 6.4. LDL 112. Recommend enhancing lipid control following hospitalization. Echo showing EF of 75%. [place]. Mild to mod MVR. Mild MS. Mod. calcification of aortic valve. SLP to reevaluate, RN concern for aspiration risk.  - Appreciates Neuro recommendations - Antiplatelet with aspirin - Allow permissive hypertension - Telemetry - PT/OT - NPO pending SLP   Hypertension Hold amlodipine for now w/ permissive hypertension   Hypothyroidism TSH 3.3 -Continue levothyroxine 75 mcg   Chronic adrenal insufficiency -Continue prednisone 5 mg daily   Chronic ulcerative colitis -Continue mesalamine   Normocytic anemia Recent history of lower GI bleed Hemoglobin is stable. Patient denies signs of bleeding -Continue iron supplement -CBC in a.m.   Chronic stable asthma -Continue ICS-LABA -Albuterol as needed   Depression -Continue Lexapro   Advanced Alzheimer's disease -Continue memantine    Code: DNR. Patient's daughter/son are both healthcare power of attorney. She confirmed DNR status. Patient had  a DNR paperwork signed in 2022. IVF: N/A Diet: NPO pending SLP reeval DVT: Lovenox   Dispo: Admit patient to Observation with expected length of stay less than 2 midnights.   LOS: 0 days   Shirlyn Goltz, Medical  Student 12/14/2021, 9:56 AM

## 2021-12-14 NOTE — Plan of Care (Signed)
  Problem: Education: Goal: Knowledge of General Education information will improve Description: Including pain rating scale, medication(s)/side effects and non-pharmacologic comfort measures Outcome: Progressing   Problem: Education: Goal: Knowledge of General Education information will improve Description: Including pain rating scale, medication(s)/side effects and non-pharmacologic comfort measures Outcome: Progressing   Problem: Education: Goal: Knowledge of secondary prevention will improve ([facility]) 12/14/2021 2235 by Narda Rutherford, RN Outcome: Progressing 12/14/2021 2234 by Narda Rutherford, RN Outcome: Progressing

## 2021-12-15 ENCOUNTER — Inpatient Hospital Stay (HOSPITAL_COMMUNITY): Payer: Medicare Other

## 2021-12-15 DIAGNOSIS — D500 Iron deficiency anemia secondary to blood loss (chronic): Secondary | ICD-10-CM

## 2021-12-15 DIAGNOSIS — Z7189 Other specified counseling: Secondary | ICD-10-CM

## 2021-12-15 DIAGNOSIS — R1312 Dysphagia, oropharyngeal phase: Secondary | ICD-10-CM | POA: Diagnosis not present

## 2021-12-15 DIAGNOSIS — K51919 Ulcerative colitis, unspecified with unspecified complications: Secondary | ICD-10-CM

## 2021-12-15 DIAGNOSIS — I63312 Cerebral infarction due to thrombosis of left middle cerebral artery: Secondary | ICD-10-CM

## 2021-12-15 DIAGNOSIS — I6381 Other cerebral infarction due to occlusion or stenosis of small artery: Secondary | ICD-10-CM

## 2021-12-15 DIAGNOSIS — E039 Hypothyroidism, unspecified: Secondary | ICD-10-CM

## 2021-12-15 DIAGNOSIS — F32A Depression, unspecified: Secondary | ICD-10-CM

## 2021-12-15 LAB — URINALYSIS, COMPLETE (UACMP) WITH MICROSCOPIC
Bilirubin Urine: NEGATIVE
Glucose, UA: NEGATIVE mg/dL
Ketones, ur: NEGATIVE mg/dL
Nitrite: NEGATIVE
Protein, ur: 100 mg/dL — AB
Specific Gravity, Urine: 1.015 (ref 1.005–1.030)
WBC, UA: >50 WBC/hpf — ABNORMAL HIGH (ref 0–5)
pH: 5 (ref 5.0–8.0)

## 2021-12-15 LAB — BASIC METABOLIC PANEL
Anion gap: 8 (ref 5–15)
BUN: 12 mg/dL (ref 8–23)
CO2: 25 mmol/L (ref 22–32)
Calcium: 9.1 mg/dL (ref 8.9–10.3)
Chloride: 101 mmol/L (ref 98–111)
Creatinine, Ser: 0.8 mg/dL (ref 0.44–1.00)
GFR, Estimated: >60 mL/min (ref >60)
Glucose, Bld: 224 mg/dL — ABNORMAL HIGH (ref 70–99)
Potassium: 3.5 mmol/L (ref 3.5–5.1)
Sodium: 134 mmol/L — ABNORMAL LOW (ref 135–145)

## 2021-12-15 LAB — CBC
HCT: 39.3 % (ref 36.0–46.0)
Hemoglobin: 12.8 g/dL (ref 12.0–15.0)
MCH: 28.9 pg (ref 26.0–34.0)
MCHC: 32.6 g/dL (ref 30.0–36.0)
MCV: 88.7 fL (ref 80.0–100.0)
Platelets: 303 10*3/uL (ref 150–400)
RBC: 4.43 MIL/uL (ref 3.87–5.11)
RDW: 14.3 % (ref 11.5–15.5)
WBC: 15.4 10*3/uL — ABNORMAL HIGH (ref 4.0–10.5)
nRBC: 0 % (ref 0.0–0.2)

## 2021-12-15 NOTE — Progress Notes (Signed)
Physical Therapy Treatment Patient Details Name: Sierra [name] MRN: [id number] DOB: [birth year] Today's Date: 12/15/2021   History of Present Illness Pt is a 88 y/o female admitted from SNF secondary to R sided weakness. Repeat MRI of the brain revealed L internal capsule infarct. PMH significant for advanced Alzheimer disease, depression, hypertension, hypothyroidism, asthma, ulcerative colitis, chronic adrenal insufficiency on prednisone.    PT Comments    Pt had testing off unit immediately prior to PT session. She was alert and conversant at beginning of session, but quickly fatigued with bed mobility activity. It was discovered that pt had a BM in the bed and required total assist for peri-care and linen change. Multimodal cues required for use of rails, positioning of LE's in sidelying, and bed pad utilized to achieve full roll. Pt nodding head when asked if she was tired, and was asleep prior to PT leaving the room. Pt continues to demonstrate significant functional deficits, as pt was ambulatory with a RW, and able to manage taking herself to the bathroom. Continue to feel this patient requires SNF level rehab at d/c to maximize functional independence, safety, and to decrease risk for falls. Will continue to follow.    Recommendations for follow up therapy are one component of a multi-disciplinary discharge planning process, led by the attending physician.  Recommendations may be updated based on patient status, additional functional criteria and insurance authorization.  Follow Up Recommendations  Skilled nursing-short term rehab (<3 hours/day) Can patient physically be transported by private vehicle: No   Assistance Recommended at Discharge Frequent or constant Supervision/Assistance  Patient can return home with the following Two people to help with walking and/or transfers;Two people to help with bathing/dressing/bathroom;Assistance with feeding;Direct supervision/assist for  medications management   Equipment Recommendations  None recommended by PT    Recommendations for Other Services       Precautions / Restrictions Precautions Precautions: Fall Precaution Comments: incontinent Restrictions Weight Bearing Restrictions: No     Mobility  Bed Mobility Overal bed mobility: Needs Assistance Bed Mobility: Rolling Rolling: Mod assist         General bed mobility comments: VC's for use of railings for support. Bed pad utilized to assist with rolling and assist required for positioning on her side for peri-care.    Transfers                   General transfer comment: Not tested as pt appears extremely fatigued after clean up from incontinence. Pt nodded head when asked if she was tired, and pt was asleep before therapist left the room.    Ambulation/Gait                   Stairs             Wheelchair Mobility    Modified Rankin (Stroke Patients Only) Modified Rankin (Stroke Patients Only) Pre-Morbid Rankin Score: Moderately severe disability Modified Rankin: Severe disability     Balance                                            Cognition Arousal/Alertness: Awake/alert Behavior During Therapy: Flat affect Overall Cognitive Status: History of cognitive impairments - at baseline Area of Impairment: Orientation, Attention, Memory, Following commands, Safety/judgement, Awareness, Problem solving                 Orientation  Level: Disoriented to, Place, Time, Situation Current Attention Level: Focused Memory: Decreased short-term memory Following Commands: Follows one step commands inconsistently, Follows one step commands with increased time Safety/Judgement: Decreased awareness of deficits, Decreased awareness of safety Awareness: Intellectual Problem Solving: Slow processing, Decreased initiation, Difficulty sequencing, Requires verbal cues, Requires tactile cues           Exercises      General Comments        Pertinent Vitals/Pain Pain Assessment Pain Assessment: No/denies pain Pain Intervention(s): Monitored during session    Home Living                          Prior Function            PT Goals (current goals can now be found in the care plan section) Acute Rehab PT Goals Patient Stated Goal: None stated PT Goal Formulation: Patient unable to participate in goal setting Time For Goal Achievement: 12/27/21 Potential to Achieve Goals: Fair Progress towards PT goals: Progressing toward goals    Frequency    Min 3X/week      PT Plan Current plan remains appropriate    Co-evaluation              AM-PAC PT "6 Clicks" Mobility   Outcome Measure  Help needed turning from your back to your side while in a flat bed without using bedrails?: A Little Help needed moving from lying on your back to sitting on the side of a flat bed without using bedrails?: A Lot Help needed moving to and from a bed to a chair (including a wheelchair)?: Total Help needed standing up from a chair using your arms (e.g., wheelchair or bedside chair)?: A Lot Help needed to walk in hospital room?: Total Help needed climbing 3-5 steps with a railing? : Total 6 Click Score: 10    End of Session   Activity Tolerance: Patient tolerated treatment well Patient left: in bed;with call bell/phone within reach;with bed alarm set Nurse Communication: Mobility status PT Visit Diagnosis: Other abnormalities of gait and mobility (R26.89);Other symptoms and signs involving the nervous system (R29.898)     Time: 1438-1500 PT Time Calculation (min) (ACUTE ONLY): 22 min  Charges:  $Therapeutic Activity: 8-22 mins                     Rolinda Roan, PT, DPT Acute Rehabilitation Services Secure Chat Preferred Office: [phone]    Thelma Comp 12/15/2021, 3:23 PM

## 2021-12-15 NOTE — Progress Notes (Signed)
Modified Barium Swallow Progress Note  Patient Details  Name: Sierra [name] MRN: [id number] Date of Birth: [birth year]  Today's Date: 12/15/2021  Modified Barium Swallow completed.  Full report located under Chart Review in the Imaging Section.  Brief recommendations include the following:  Clinical Impression  Pt demonstrates a mild oral dysphagia characterized by oral holding with all boluses. There are instances of trace spillage to the vallecular sinuses during oral hold. In most cases pt will initiate a swallow if given a verbal cue. She is capable of mastication. Pharyngeal phase WFL. No penetration or aspiration. Question if oral holding is a baseline impairment given diagnosis of Alzheimer disease. Recommend pt continue dys 2 solids and thin liquids. Will likely need supervision with meals. SLP will f/u to observe with a meal and determine appropriate supports as needed.   Swallow Evaluation Recommendations       SLP Diet Recommendations: Dysphagia 2 (Fine chop) solids;Thin liquid   Liquid Administration via: Cup;Straw   Medication Administration: Crushed with puree   Supervision: Staff to assist with self feeding;Full supervision/cueing for compensatory strategies   Compensations: Other (Comment) (ask to swallow - oral holding)   Postural Changes: Seated upright at 90 degrees   Oral Care Recommendations: Oral care BID   Other Recommendations: Have oral suction available    Jaishaun Mcnab, Katherene Ponto 12/15/2021,3:28 PM

## 2021-12-15 NOTE — NC FL2 (Signed)
[place] LEVEL OF CARE SCREENING TOOL     IDENTIFICATION  Patient Name: Sierra [name] Birthdate: 1929/04/11 Sex: female Admission Date (Current Location): 12/13/2021  [facility] and [place] Number:  Herbalist and Address:  [facility], [place] [address] [place], [place] [address]      Provider Number: [id number]  Attending Physician Name and Address:  Lottie Mussel, MD  Relative Name and Phone Number:       Current Level of Care: Hospital Recommended Level of Care: [place] Prior Approval Number:    Date Approved/Denied:   PASRR Number: [id number]  Discharge Plan: SNF    Current Diagnoses: Patient Active Problem List   Diagnosis Date Noted   Complex care coordination    History of meningioma 12/14/2021   History of craniotomy 12/14/2021   Acute right-sided weakness 12/13/2021   GIB (gastrointestinal bleeding) 11/01/2021   Hyperglycemia 11/01/2021   SIRS (systemic inflammatory response syndrome) ([place]) 11/01/2021   Depression 11/01/2021   Wound of left ankle 05/20/2017   Pancreatic mass 03/25/2017   Post-concussion vertigo 03/10/2017   Post concussive syndrome 03/06/2017   Concussion with no loss of consciousness    Fall    Alzheimer's dementia ([place]) 03/03/2017   Memory loss 12/17/2016   Gait abnormality 12/17/2016   CSF rhinorrhea 11/12/2016   Edema extremities 07/13/2016   Dyspnea on exertion 12/04/2015   Anemia 12/12/2014   Anxiety 12/12/2014   Asthma, chronic 12/12/2014   Chronic kidney disease, stage II (mild) 12/12/2014   H/O: GI bleed 12/12/2014   Postural lightheadedness 12/12/2014   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[place]) 12/12/2014   Adrenal insufficiency ([place]) 01/03/2014   Ulcerative colitis, chronic ([place]) 01/03/2014   Meningioma ([place]) 06/08/2013   Acute blood loss anemia 01/06/2013   Malaise and fatigue 09/21/2011   Benign hypertensive heart disease without congestive heart failure  09/21/2011   Atrial premature contractions 09/21/2011   Osteoarthritis of hip 07/27/2011   DYSPNEA ON EXERTION 05/30/2010   Hypothyroidism 05/01/2010   HOARSENESS, CHRONIC 11/05/2009   Asthmatic bronchitis, mild persistent, uncomplicated 22/29/7989   Hyperlipidemia 07/11/2007   ANEMIA, IRON DEFICIENCY, MICROCYTIC 07/11/2007   Leukocytosis 07/11/2007   THROMBOCYTOSIS 07/11/2007   HTN (hypertension) 07/11/2007   PHARYNGITIS, CHRONIC 07/11/2007   Nonallergic vasomotor rhinitis 07/11/2007   GERD (gastroesophageal reflux disease) 07/11/2007   DIVERTICULITIS, COLON 07/11/2007   ARTHRITIS 07/11/2007   CARDIAC MURMUR 07/11/2007   Diverticulitis of colon 07/11/2007    Orientation RESPIRATION BLADDER Height & Weight     Self, Situation, Place  O2 ([place] 2L) Incontinent Weight: 155 lb 10.3 oz (70.6 kg) Height:  '5\' 3"'$  (160 cm)  BEHAVIORAL SYMPTOMS/MOOD NEUROLOGICAL BOWEL NUTRITION STATUS      Continent Diet (see DC summary)  AMBULATORY STATUS COMMUNICATION OF NEEDS Skin   Extensive Assist Verbally Normal                       Personal Care Assistance Level of Assistance  Bathing, Feeding, Dressing Bathing Assistance: Maximum assistance Feeding assistance: Maximum assistance Dressing Assistance: Maximum assistance     Functional Limitations Info             SPECIAL CARE FACTORS FREQUENCY  PT (By licensed PT), OT (By licensed OT), Speech therapy     PT Frequency: 5x/wk OT Frequency: 5x/wk     Speech Therapy Frequency: 5x/wk      Contractures Contractures Info: Not present    Additional Factors Info  Code  Status, Allergies, Psychotropic Code Status Info: DNR Allergies Info: Aricept (Donepezil), Sulfamethoxazole, Tape, Biaxin (Clarithromycin), Ciprofloxacin, Codeine, Dilantin (Phenytoin), Doxycycline, Lactose Intolerance (Gi), Restasis (Cyclosporine), Sulfonamide Derivatives Psychotropic Info: Lexapro '20mg'$  daily         Current Medications (12/15/2021):  This is the  current hospital active medication list Current Facility-Administered Medications  Medication Dose Route Frequency Provider Last Rate Last Admin   acetaminophen (TYLENOL) tablet 650 mg  650 mg Oral Q6H PRN Gaylan Gerold, DO   650 mg at 12/15/21 0453   Or   acetaminophen (TYLENOL) suppository 650 mg  650 mg Rectal Q6H PRN Gaylan Gerold, DO       albuterol (PROVENTIL) (2.5 MG/3ML) 0.083% nebulizer solution 2.5 mg  2.5 mg Inhalation Q4H PRN Gaylan Gerold, DO       aspirin chewable tablet 81 mg  81 mg Oral Daily Gaylan Gerold, DO   81 mg at 12/15/21 7342   Or   aspirin suppository 300 mg  300 mg Rectal Daily Gaylan Gerold, DO       clopidogrel (PLAVIX) tablet 75 mg  75 mg Oral Daily Rosalin Hawking, MD   75 mg at 12/15/21 0855   enoxaparin (LOVENOX) injection 40 mg  40 mg Subcutaneous Q24H Gaylan Gerold, DO   40 mg at 12/15/21 0855   escitalopram (LEXAPRO) tablet 20 mg  20 mg Oral Daily Gaylan Gerold, DO   20 mg at 12/15/21 8768   ferrous sulfate tablet 325 mg  325 mg Oral Q breakfast Gaylan Gerold, DO   325 mg at 12/15/21 0746   levothyroxine (SYNTHROID) tablet 75 mcg  75 mcg Oral Q0600 Gaylan Gerold, DO   75 mcg at 12/15/21 0453   memantine (NAMENDA) tablet 5 mg  5 mg Oral BID Gaylan Gerold, DO   5 mg at 12/15/21 0855   mesalamine (LIALDA) EC tablet 1.2 g  1.2 g Oral Q breakfast Gaylan Gerold, DO   1.2 g at 12/15/21 0746   mometasone-formoterol (DULERA) 200-5 MCG/ACT inhaler 2 puff  2 puff Inhalation BID Gaylan Gerold, DO   2 puff at 12/15/21 0850   oxybutynin (DITROPAN-XL) 24 hr tablet 10 mg  10 mg Oral Daily Gaylan Gerold, DO   10 mg at 12/15/21 0856   predniSONE (DELTASONE) tablet 5 mg  5 mg Oral Q breakfast Gaylan Gerold, DO   5 mg at 12/15/21 0746   rosuvastatin (CRESTOR) tablet 20 mg  20 mg Oral Daily Rosalin Hawking, MD   20 mg at 12/15/21 1157     Discharge Medications: Please see discharge summary for a list of discharge medications.  Relevant Imaging Results:  Relevant Lab Results:   Additional  Information SS#: [id number]  Geralynn Ochs, LCSW

## 2021-12-15 NOTE — TOC Initial Note (Signed)
Transition of Care [facility]) - Initial/Assessment Note    Patient Details  Name: Sierra [name] MRN: [id number] Date of Birth: [birth year]  [facility] CM/SW Contact:    Geralynn Ochs, LCSW Phone Number: 12/15/2021, 3:01 PM  Clinical Narrative:                CSW spoke with [facility] to discuss patient's prior level of care and functioning, as well as needs upon return. Patient from assisted living memory care, and was independently able to ambulate with a walker. She could take herself to the bathroom and dining room for meals, was able to feed herself. She would sometimes need cuing for ADLs (for example, would walk to the bathroom but forget a new brief; aide would have to get her a brief to change, but she could change the brief herself). Aides would assist her with showing for safety, and because she wouldn't remember to reach all of the important places while washing.   [facility] is planning on admission to SNF for rehab, as patient is far from her baseline at this time. CSW completed referral and sent to [facility], requested initiation of insurance authorization. CSW to follow.   Expected Discharge Plan: Skilled Nursing Facility Barriers to Discharge: Continued Medical Work up, Ship broker   Patient Goals and CMS Choice Patient states their goals for this hospitalization and ongoing recovery are:: patient unable to participate in goal setting, not oriented CMS Medicare.gov Compare Post Acute Care list provided to:: Patient Represenative (must comment) Choice offered to / list presented to : Adult Children  Expected Discharge Plan and Services Expected Discharge Plan: [place] Choice: [place] arrangements for the past 2 months: [place]                                      Prior Living Arrangements/Services Living arrangements for the past 2 months: [place] Lives with:: Facility Resident Patient language and need for interpreter reviewed:: No Do you feel safe going back to the place where you live?: Yes      Need for Family Participation in Patient Care: Yes (Comment) Care giver support system in place?: Yes (comment) Current home services: DME Criminal Activity/Legal Involvement Pertinent to Current Situation/Hospitalization: No - Comment as needed  Activities of Daily Living Home Assistive Devices/Equipment: None ADL Screening (condition at time of admission) Patient's cognitive ability adequate to safely complete daily activities?: Yes Is the patient deaf or have difficulty hearing?: Yes Does the patient have difficulty seeing, even when wearing glasses/contacts?: No Does the patient have difficulty concentrating, remembering, or making decisions?: Yes Patient able to express need for assistance with ADLs?: Yes Does the patient have difficulty dressing or bathing?: Yes Independently performs ADLs?: No Communication: Independent Dressing (OT): Needs assistance Does the patient have difficulty walking or climbing stairs?: No Weakness of Legs: Both Weakness of Arms/Hands: Both  Permission Sought/Granted Permission sought to share information with : Facility Sport and exercise psychologist, Family Supports Permission granted to share information with : Yes, Verbal Permission Granted  Share Information with NAME: Ginger Carne  Permission granted to share info w AGENCY: SNF  Permission granted to share info w Relationship: Children     Emotional Assessment Appearance:: Appears stated age Attitude/Demeanor/Rapport: Unable to Assess Affect (typically observed): Unable to Assess Orientation: : Oriented to Self Alcohol /  Substance Use: Not Applicable Psych Involvement: No (comment)  Admission diagnosis:  TIA (transient ischemic attack) [G45.9] Acute right-sided weakness [R53.1] Patient Active Problem List   Diagnosis Date  Noted   Complex care coordination    History of meningioma 12/14/2021   History of craniotomy 12/14/2021   Acute right-sided weakness 12/13/2021   GIB (gastrointestinal bleeding) 11/01/2021   Hyperglycemia 11/01/2021   SIRS (systemic inflammatory response syndrome) ([place]) 11/01/2021   Depression 11/01/2021   Wound of left ankle 05/20/2017   Pancreatic mass 03/25/2017   Post-concussion vertigo 03/10/2017   Post concussive syndrome 03/06/2017   Concussion with no loss of consciousness    Fall    Alzheimer's dementia ([place]) 03/03/2017   Memory loss 12/17/2016   Gait abnormality 12/17/2016   CSF rhinorrhea 11/12/2016   Edema extremities 07/13/2016   Dyspnea on exertion 12/04/2015   Anemia 12/12/2014   Anxiety 12/12/2014   Asthma, chronic 12/12/2014   Chronic kidney disease, stage II (mild) 12/12/2014   H/O: GI bleed 12/12/2014   Postural lightheadedness 12/12/2014   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[place]) 12/12/2014   Adrenal insufficiency ([place]) 01/03/2014   Ulcerative colitis, chronic ([place]) 01/03/2014   Meningioma ([place]) 06/08/2013   Acute blood loss anemia 01/06/2013   Malaise and fatigue 09/21/2011   Benign hypertensive heart disease without congestive heart failure 09/21/2011   Atrial premature contractions 09/21/2011   Osteoarthritis of hip 07/27/2011   DYSPNEA ON EXERTION 05/30/2010   Hypothyroidism 05/01/2010   HOARSENESS, CHRONIC 11/05/2009   Asthmatic bronchitis, mild persistent, uncomplicated 41/74/0814   Hyperlipidemia 07/11/2007   ANEMIA, IRON DEFICIENCY, MICROCYTIC 07/11/2007   Leukocytosis 07/11/2007   THROMBOCYTOSIS 07/11/2007   HTN (hypertension) 07/11/2007   PHARYNGITIS, CHRONIC 07/11/2007   Nonallergic vasomotor rhinitis 07/11/2007   GERD (gastroesophageal reflux disease) 07/11/2007   DIVERTICULITIS, COLON 07/11/2007   ARTHRITIS 07/11/2007   CARDIAC MURMUR 07/11/2007   Diverticulitis of colon 07/11/2007   PCP:  Patient, No Pcp Per Pharmacy:   [facility] - [address] [place] [address] Phone: [phone] Fax: [phone]     Social Determinants of Health (SDOH) Interventions    Readmission Risk Interventions     No data to display

## 2021-12-15 NOTE — Progress Notes (Signed)
Subjective:  Pt voice appears to be worse in which she is more quiet and raspy today. She denies any shortness of breath or chest pain. She reports some pain in her side when she coughs. Able to recall daughters name, can recognize pen, and knows shes in the hospital   Objective:  Vital signs in last 24 hours: Vitals:   12/14/21 2338 12/15/21 0411 12/15/21 0729 12/15/21 0850  BP: (!) 160/81 (!) 173/90 (!) 168/81   Pulse: 87 85 84 82  Resp: (!) 22 (!) '24 18 20  '$ Temp: 98.5 F (36.9 C) 98.7 F (37.1 C) 98.2 F (36.8 C)   TempSrc: Oral Oral Oral   SpO2: 96% 96% 95% 95%  Weight:      Height:       Weight change:   Intake/Output Summary (Last 24 hours) at 12/15/2021 1023 Last data filed at 12/14/2021 1800 Gross per 24 hour  Intake 270 ml  Output --  Net 270 ml   Physical Exam General: Elderly appearing woman lying in bed, in no acute distress HEENT: Normocephalic, atraumatic, EOM intact, conjunctiva normal CV: Regular rate and rhythm, no murmurs rubs or gallops Pulm: Clear to auscultation bilaterally, normal respiratory effort Abdomen: Soft, non tender. Normal BS. MSK: Moving all extremities equally. No edema. Distal pulses 2+ Skin: Warm and dry. No rash Neuro: Alert and oriented x2 to person and place. Strength was 4/5 strength on right, 5/5 on left side. Sensation intact. Dysathria noted. Tongue protrudes midline. Facial droop noted. Eyebrow raise equal. Psych: Mood and Affect are normal  Assessment/Plan:  Principal Problem:   Acute right-sided weakness Active Problems:   HTN (hypertension)   Hypothyroidism   Adrenal insufficiency (H[facility])   Asthma, chronic   Alzheimer's dementia ([place])   History of meningioma   History of craniotomy   Sierra [name] is a 88 year old female with past medical history of advanced Alzheimer disease, depression, hypertension, hypothyroidism, asthma, ulcerative colitis, chronic adrenal insufficiency is on prednisone who presents to the  hospital for acute episode of right-sided weakness and facial drooping, admitted for management of CVA.   Acute lacunar infarct, capsular warning syndrome Right-sided weakness and facial droop Pt initially presented with right sided weakness and facial droop that seemed to resolve. CT head, MRI brain, CTA head and neck were unremarkable. Yesterday, pt with facial droop, dysarthria, and trouble with word finding. Repeat MRI brain showing small acute infarct in the posterior limb left internal capsule. Chronic small vessel ischemia, brain atrophy, and left parietal encephalomalacia. Symptoms are consistent with capsular warning syndrome given transient symptoms. Continue ASA and started DAPT with Plavix following neuro recommendations. Pt with a history of GI bleed 2/2 to UC will watch for bleeding. A1c 6.4. LDL 112 increased Crestor to '20mg'$ . Echo showing EF of 75%. Sierra [name]. Mild to mod MVR. Mild MS. Mod. calcification of aortic valve. - Appreciates Neuro recommendations - DAPT with ASA and Plavix - Allow permissive hypertension - PT/OT  Hypertension Hold amlodipine for now w/ permissive hypertension.    Hypothyroidism TSH 3.3 -Continue levothyroxine 75 mcg   Chronic adrenal insufficiency -Continue prednisone 5 mg daily   Chronic ulcerative colitis -Continue mesalamine   Normocytic anemia Recent history of lower GI bleed Hemoglobin is stable. Patient denies signs of bleeding -Continue iron supplement -CBC in a.m.   Chronic stable asthma -Continue ICS-LABA -Albuterol as needed   Depression -Continue Lexapro   Advanced Alzheimer's disease -Continue memantine    Code: DNR. Patient's daughter/son are both healthcare power  of attorney. She confirmed DNR status. Patient had a DNR paperwork signed in 2022. IVF: N/A Diet: Dys 2 with nectar thick fluids DVT: Lovenox Fam comm: Spoke with daughter today. She would like to prioritize dispo back to memory care unit over SNF.   LOS: 1 day    Sierra [name], Medical Student 12/15/2021, 10:23 AM

## 2021-12-15 NOTE — Progress Notes (Signed)
STROKE TEAM PROGRESS NOTE   SUBJECTIVE (INTERVAL HISTORY) No family is at the bedside. Pt initially sleeping but easily arousable. Orientated to place and age but not to time. Still has mild hemiparesis on the right, no significant change from yesterday. However, she is hypophonic today. She is on dys 2 and nectar thick liquid but not eating well per RN.    OBJECTIVE Temp:  [98 F (36.7 C)-98.7 F (37.1 C)] 98.2 F (36.8 C) (08/07 1157) Pulse Rate:  [82-91] 82 (08/07 0850) Cardiac Rhythm: Normal sinus rhythm;Bundle branch block (08/07 0700) Resp:  [16-25] 20 (08/07 0850) BP: (131-173)/(71-90) 148/71 (08/07 1157) SpO2:  [91 %-100 %] 100 % (08/07 1157) FiO2 (%):  [28 %] 28 % (08/07 0850)  Recent Labs  Lab 12/13/21 0656 12/14/21 0848  GLUCAP 118* 172*   Recent Labs  Lab 12/13/21 0701 12/13/21 0702 12/14/21 0432  NA 137 139 135  K 3.5 3.9 3.5  CL 102 101 99  CO2 25  --  24  GLUCOSE 121* 120* 127*  BUN '13 19 11  '$ CREATININE 0.87 0.80 0.87  CALCIUM 9.1  --  9.4   Recent Labs  Lab 12/13/21 0701  AST 29  ALT 34  ALKPHOS 39  BILITOT 0.6  PROT 7.0  ALBUMIN 3.4*   Recent Labs  Lab 12/13/21 0701 12/13/21 0702 12/13/21 1055 12/13/21 1644 12/14/21 0432 12/15/21 0622  WBC 11.5*  --   --   --  10.6* 15.4*  NEUTROABS 8.6*  --   --   --   --   --   HGB 11.7* 12.2 12.1 11.7* 12.6 12.8  HCT 37.1 36.0 37.4 37.0 38.1 39.3  MCV 91.6  --   --   --  88.2 88.7  PLT 269  --   --   --  276 303   No results for input(s): "CKTOTAL", "CKMB", "CKMBINDEX", "TROPONINI" in the last 168 hours. Recent Labs    12/13/21 0701  LABPROT 14.2  INR 1.1   No results for input(s): "COLORURINE", "LABSPEC", "PHURINE", "GLUCOSEU", "HGBUR", "BILIRUBINUR", "KETONESUR", "PROTEINUR", "UROBILINOGEN", "NITRITE", "LEUKOCYTESUR" in the last 72 hours.  Invalid input(s): "APPERANCEUR"     Component Value Date/Time   CHOL 205 (H) 12/13/2021 1055   TRIG 245 (H) 12/13/2021 1055   HDL 44 12/13/2021 1055    CHOLHDL 4.7 12/13/2021 1055   VLDL 49 (H) 12/13/2021 1055   LDLCALC 112 (H) 12/13/2021 1055   LDLCALC 133 (H) 02/01/2017 1128   Lab Results  Component Value Date   HGBA1C 6.4 (H) 12/13/2021   No results found for: "LABOPIA", "COCAINSCRNUR", "LABBENZ", "AMPHETMU", "THCU", "LABBARB"  Recent Labs  Lab 12/13/21 0700  ETH <10    I have personally reviewed the radiological images below and agree with the radiology interpretations.  DG Chest 2 View  Result Date: 12/14/2021 CLINICAL DATA:  Shortness of breath. EXAM: CHEST - 2 VIEW COMPARISON:  12/13/2021 FINDINGS: Stable cardiomediastinal contours. Aortic atherosclerotic calcifications. Unchanged asymmetric elevation of the right hemidiaphragm. Chronic interstitial coarsening identified bilaterally. No signs of pleural effusion, interstitial edema or airspace consolidation. IMPRESSION: No active cardiopulmonary abnormalities. Electronically Signed   By: Kerby Moors M.D.   On: 12/14/2021 11:03   MR BRAIN WO CONTRAST  Result Date: 12/14/2021 CLINICAL DATA:  Neuro deficit with acute stroke suspected. EXAM: MRI HEAD WITHOUT CONTRAST TECHNIQUE: Multiplanar, multiecho pulse sequences of the brain and surrounding structures were obtained without intravenous contrast. COMPARISON:  Yesterday FINDINGS: Brain: An acute small vessel infarct has become  apparent in the posterior limb left internal capsule. Chronic small vessel ischemia in the cerebral white matter and pons. Chronic lacunar infarct in the left pons. Large area of encephalomalacia centered in the left low parietal region deep to a craniotomy. Generalized brain atrophy. Vascular: Normal flow voids. Skull and upper cervical spine: Unremarkable remote left parietal craniotomy. No focal marrow signal abnormality. Sinuses/Orbits: Bilateral cataract resection. IMPRESSION: 1. Small acute infarct in the posterior limb left internal capsule. 2. Chronic small vessel ischemia, brain atrophy, and left  parietal encephalomalacia. Electronically Signed   By: Jorje Guild M.D.   On: 12/14/2021 10:40   ECHOCARDIOGRAM COMPLETE  Result Date: 12/13/2021    ECHOCARDIOGRAM REPORT   Patient Name:   Sierra [name] Date of Exam: 12/13/2021 Medical Rec #:  [id number]      Height:       63.0 in Accession #:    [id number]     Weight:       155.6 lb Date of Birth:  1929-04-20     BSA:          1.738 m Patient Age:    77 years       BP:           153/75 mmHg Patient Gender: F              HR:           86 bpm. Exam Location:  Inpatient Procedure: 2D Echo, Cardiac Doppler and Color Doppler                              MODIFIED REPORT: This report was modified by Cherlynn Kaiser MD on 12/13/2021 due to revision.  Indications:     Stroke  History:         Patient has prior history of Echocardiogram examinations, most                  recent 12/17/2015. Risk Factors:Dyslipidemia and Hypertension.  Sonographer:     Jefferey Pica Referring Phys:  [id number] Velna Ochs Diagnosing Phys: Cherlynn Kaiser MD IMPRESSIONS  1. Left ventricular ejection fraction, by estimation, is 70 to 75%. The left ventricle has hyperdynamic function. The left ventricle has no regional wall motion abnormalities. There is moderate left ventricular hypertrophy. Left ventricular diastolic parameters are indeterminate.  2. Right ventricular systolic function is normal. The right ventricular size is normal.  3. The mitral valve is degenerative. Mild to moderate mitral valve regurgitation. Mild mitral stenosis. The mean mitral valve gradient is 5.0 mmHg with average heart rate of 87 bpm. Severe mitral annular calcification.  4. The aortic valve is grossly normal. There is moderate calcification of the aortic valve. Aortic valve regurgitation is trivial. No aortic stenosis is present. FINDINGS  Left Ventricle: Left ventricular ejection fraction, by estimation, is 70 to 75%. The left ventricle has hyperdynamic function. The left ventricle has no regional wall  motion abnormalities. The left ventricular internal cavity size was normal in size. There is moderate left ventricular hypertrophy. Left ventricular diastolic parameters are indeterminate. Right Ventricle: The right ventricular size is normal. No increase in right ventricular wall thickness. Right ventricular systolic function is normal. Left Atrium: Left atrial size was normal in size. Right Atrium: Right atrial size was normal in size. Pericardium: There is no evidence of pericardial effusion. Presence of epicardial fat layer. Mitral Valve: The mitral valve is degenerative in appearance. Severe mitral annular calcification.  Mild to moderate mitral valve regurgitation. Mild mitral valve stenosis. MV peak gradient, 15.8 mmHg. The mean mitral valve gradient is 5.0 mmHg with average heart rate of 87 bpm. Tricuspid Valve: The tricuspid valve is normal in structure. Tricuspid valve regurgitation is mild . No evidence of tricuspid stenosis. Aortic Valve: The aortic valve is grossly normal. There is moderate calcification of the aortic valve. Aortic valve regurgitation is trivial. No aortic stenosis is present. Aortic valve mean gradient measures 7.5 mmHg. Aortic valve peak gradient measures  13.8 mmHg. Aortic valve area, by VTI measures 1.91 cm. Pulmonic Valve: The pulmonic valve was normal in structure. Pulmonic valve regurgitation is trivial. No evidence of pulmonic stenosis. Aorta: The aortic root is normal in size and structure. Ascending aorta measurements are within normal limits for age when indexed to body surface area. Venous: The inferior vena cava was not well visualized. IAS/Shunts: The interatrial septum was not well visualized.  LEFT VENTRICLE PLAX 2D LVIDd:         3.50 cm   Diastology LVIDs:         2.40 cm   LV e' lateral:   4.78 cm/s LV PW:         1.50 cm   LV E/e' lateral: 18.7 LV IVS:        1.40 cm LVOT diam:     1.80 cm LV SV:         67 LV SV Index:   39 LVOT Area:     2.54 cm  RIGHT VENTRICLE  RV Basal diam:  2.70 cm RV S prime:     13.30 cm/s TAPSE (M-mode): 1.9 cm LEFT ATRIUM             Index        RIGHT ATRIUM           Index LA diam:        3.00 cm 1.73 cm/m   RA Area:     10.40 cm LA Vol (A2C):   53.4 ml 30.72 ml/m  RA Volume:   24.50 ml  14.10 ml/m LA Vol (A4C):   49.5 ml 28.48 ml/m LA Biplane Vol: 51.9 ml 29.86 ml/m  AORTIC VALVE                     PULMONIC VALVE AV Area (Vmax):    2.25 cm      PV Vmax:       1.12 m/s AV Area (Vmean):   2.00 cm      PV Peak grad:  5.0 mmHg AV Area (VTI):     1.91 cm AV Vmax:           185.69 cm/s AV Vmean:          126.528 cm/s AV VTI:            0.351 m AV Peak Grad:      13.8 mmHg AV Mean Grad:      7.5 mmHg LVOT Vmax:         164.00 cm/s LVOT Vmean:        99.200 cm/s LVOT VTI:          0.264 m LVOT/AV VTI ratio: 0.75  AORTA Ao Root diam: 3.30 cm Ao Asc diam:  3.70 cm MITRAL VALVE                TRICUSPID VALVE MV Area (PHT): 2.46 cm     TR Peak grad:  25.0 mmHg MV Peak grad:  15.8 mmHg    TR Vmax:        250.00 cm/s MV Mean grad:  5.0 mmHg MV Vmax:       1.98 m/s     SHUNTS MV Vmean:      102.5 cm/s   Systemic VTI:  0.26 m MV Decel Time: 309 msec     Systemic Diam: 1.80 cm MV E velocity: 89.30 cm/s MV A velocity: 164.00 cm/s MV E/A ratio:  0.54 Cherlynn Kaiser MD Electronically signed by Cherlynn Kaiser MD Signature Date/Time: 12/13/2021/7:29:15 PM    Final (Updated)    CT ANGIO HEAD NECK W WO CM  Result Date: 12/13/2021 CLINICAL DATA:  Stroke follow-up EXAM: CT ANGIOGRAPHY HEAD AND NECK TECHNIQUE: Multidetector CT imaging of the head and neck was performed using the standard protocol during bolus administration of intravenous contrast. Multiplanar CT image reconstructions and MIPs were obtained to evaluate the vascular anatomy. Carotid stenosis measurements (when applicable) are obtained utilizing NASCET criteria, using the distal internal carotid diameter as the denominator. RADIATION DOSE REDUCTION: This exam was performed according to the  departmental dose-optimization program which includes automated exposure control, adjustment of the mA and/or kV according to patient size and/or use of iterative reconstruction technique. CONTRAST:  62m OMNIPAQUE IOHEXOL 350 MG/ML SOLN COMPARISON:  Brain MRI from earlier today FINDINGS: CTA NECK FINDINGS Aortic arch: Atheromatous plaque.  Two vessel branching. Right carotid system: Mild atheromatous calcification at the bifurcation. No stenosis or ulceration. Left carotid system: Mild calcified plaque at the bifurcation. No stenosis or ulceration. Vertebral arteries: No proximal subclavian stenosis or ulceration. Atheromatous plaque at the left vertebral origin. No vertebral stenosis or beading Skeleton: Advanced and generalized cervical spine degeneration. No acute or focal finding. Unremarkable left parietal craniotomy Other neck: No acute finding Upper chest: No acute finding Review of the MIP images confirms the above findings CTA HEAD FINDINGS Anterior circulation: Atheromatous calcification of the carotid siphons. Advanced atheromatous irregularity of the MCA branches with diffuse mild to moderate narrowings bilaterally. The right A1 segment is small and hypoplastic appearing. Negative for aneurysm Posterior circulation: Vertebral and basilar arteries are diffusely patent. Atheromatous irregularity of bilateral posterior cerebral arteries. Negative for aneurysm Venous sinuses: Unremarkable Anatomic variants: Unremarkable Review of the MIP images confirms the above findings IMPRESSION: 1. No emergent finding. 2. Atherosclerosis with advanced intracranial branch involvement. No proximal/correctable flow reducing stenosis. Electronically Signed   By: JJorje GuildM.D.   On: 12/13/2021 10:14   DG Chest Port 1 View  Result Date: 12/13/2021 CLINICAL DATA:  Cough. EXAM: PORTABLE CHEST 1 VIEW COMPARISON:  04/17/2020 FINDINGS: 0943 hours. Stable asymmetric elevation right hemidiaphragm. The cardio pericardial  silhouette is enlarged. Interstitial markings are diffusely coarsened with chronic features. Bones are diffusely demineralized. Telemetry leads overlie the chest. IMPRESSION: Stable exam. No acute cardiopulmonary findings. Electronically Signed   By: EMisty StanleyM.D.   On: 12/13/2021 10:12   MR BRAIN WO CONTRAST  Result Date: 12/13/2021 CLINICAL DATA:  Right-sided weakness EXAM: MRI HEAD WITHOUT CONTRAST TECHNIQUE: Multiplanar, multiecho pulse sequences of the brain and surrounding structures were obtained without intravenous contrast. COMPARISON:  Head CT from earlier today FINDINGS: Brain: No acute infarction, hemorrhage, hydrocephalus, extra-axial collection or mass lesion. Chronic small vessel ischemia in the hemispheric white matter and pons. Brain atrophy that is generalized. Chronic left parietal and upper occipital encephalomalacia beneath the craniotomy site. Vascular: Normal flow voids. Skull and upper cervical spine: Unremarkable remote left parietal craniotomy.  Sinuses/Orbits: Bilateral cataract resection IMPRESSION: No acute finding.  Stable atrophy and encephalomalacia. Electronically Signed   By: Jorje Guild M.D.   On: 12/13/2021 10:03   CT HEAD CODE STROKE WO CONTRAST  Result Date: 12/13/2021 CLINICAL DATA:  Code stroke. Neuro deficit with acute stroke suspected. Right-sided weakness EXAM: CT HEAD WITHOUT CONTRAST TECHNIQUE: Contiguous axial images were obtained from the base of the skull through the vertex without intravenous contrast. RADIATION DOSE REDUCTION: This exam was performed according to the departmental dose-optimization program which includes automated exposure control, adjustment of the mA and/or kV according to patient size and/or use of iterative reconstruction technique. COMPARISON:  12/31/2019 FINDINGS: Brain: No evidence of acute infarction, hemorrhage, hydrocephalus, extra-axial collection or mass lesion/mass effect. Encephalomalacia is noted in the left parietal lobe  deep to a prior craniotomy. Generalized brain atrophy with chronic small vessel ischemia. Chronic lacunar infarct at the left thalamus when compared to prior. Vascular: No hyperdense vessel or unexpected calcification. Skull: Unremarkable left-sided craniotomy. Sinuses/Orbits: Bilateral cataract resection. Atheromatous calcification Other: Findings directly communicated with Dr. Lorrin Goodell at 7:08 am on 12/13/2021. ASPECTS ([place] Stroke Program Early CT Score) - Ganglionic level infarction (caudate, lentiform nuclei, internal capsule, insula, M1-M3 cortex): 7 - Supraganglionic infarction (M4-M6 cortex): 3-when accounting for chronic infarct Total score (0-10 with 10 being normal): 10 IMPRESSION: 1. Stable head CT.  No acute finding. 2. Chronic small vessel disease and left parietal encephalomalacia. 3. Generalized brain atrophy. Electronically Signed   By: Jorje Guild M.D.   On: 12/13/2021 07:09   CT Abdomen Pelvis W Contrast  Result Date: 11/20/2021 CLINICAL DATA:  Abdominal pain, acute, nonlocalized EXAM: CT ABDOMEN AND PELVIS WITH CONTRAST TECHNIQUE: Multidetector CT imaging of the abdomen and pelvis was performed using the standard protocol following bolus administration of intravenous contrast. RADIATION DOSE REDUCTION: This exam was performed according to the departmental dose-optimization program which includes automated exposure control, adjustment of the mA and/or kV according to patient size and/or use of iterative reconstruction technique. CONTRAST:  133m OMNIPAQUE IOHEXOL 300 MG/ML  SOLN COMPARISON:  CT abdomen pelvis 08/25/2019 FINDINGS: Lower chest: Bibasilar atelectasis/scarring. Extensive mitral annular calcifications. Aortic valve calcifications. Hepatobiliary: Severe diffuse hepatic steatosis. Focal fatty sparing along the gallbladder fossa. There is a small adjacent hepatic cyst measuring 1.2 cm. The gallbladder is unremarkable. Pancreas: There is a 3.9 x 3.1 cm cystic lesion in the  pancreatic body, which previously measured up to 3.5 x 2.9 cm in April 2021. There is a nodular enhancing lesion in the pancreatic tail measuring up to 1.0 cm, previously 1.2 cm. Spleen: Normal in size with multiple low-density lesions again seen, likely small cysts or hemangiomas. Adrenals/Urinary Tract: Adrenal glands are unremarkable. No hydronephrosis. There is a nonobstructive 4 mm stone in the right lower pole. Multiple small cystic lesions in the kidneys bilaterally, most of which are too small to characterize. There is a notable exophytic right upper pole renal cyst measuring 0.8 cm which is more dense in comparison to prior exam, measuring up to 93 Hounsfield units, previously 25 Hounsfield units. This persists on delayed without significant washout. This likely reflects a now hemorrhagic or proteinaceous cyst, as there is no significant change in size in comparison to prior exam in April 2021. No follow-up imaging is recommended. Bladder is markedly distended with some intraluminal gas. Stomach/Bowel: The stomach is within normal limits. There is no evidence of bowel obstruction.Prior appendectomy. Colonic diverticulosis. No evidence of acute diverticulitis. Vascular/Lymphatic: No lymphadenopathy. Aortoiliac atherosclerosis. No AAA. Reproductive: Prior  hysterectomy. Other: No hernia. No ascites. No free air. Atrophic anterior abdominal wall musculature. Musculoskeletal: There is a T11 compression fracture with approximately 50% height loss, reported on prior MRI 10/01/2020 with 10-20% height loss at that time (images not retrievable for direct comparison). Unchanged height loss of L5. There are multilevel degenerative changes of the spine with trace retrolisthesis at L4-L5. Total hip arthroplasties bilaterally are unchanged. IMPRESSION: Marked distension of the urinary bladder with intraluminal gas, correlate with history of need for bladder catheterizations. Colonic diverticulosis without evidence of  acute diverticulitis. Hepatic steatosis with fatty sparing along the gallbladder fossa. Slightly increased size of a cystic lesion in the pancreatic body measuring up to 3.9 x 3.1 cm, previously 3.5 x 2.9 cm in April 2021. No significant change in the small hyperdense mass in the pancreatic tail, which measures up to 1.0 cm. Chronic T11 compression fracture with approximately 50% height loss, reported on prior MRI 10/01/2020 with 10-20% height loss at that time (images not retrievable for direct comparison), suggesting progressive height loss. Additional chronic findings as described above. Electronically Signed   By: Maurine Simmering M.D.   On: 11/20/2021 11:12     PHYSICAL EXAM  Temp:  [98 F (36.7 C)-98.7 F (37.1 C)] 98.2 F (36.8 C) (08/07 1157) Pulse Rate:  [82-91] 82 (08/07 0850) Resp:  [16-25] 20 (08/07 0850) BP: (131-173)/(71-90) 148/71 (08/07 1157) SpO2:  [91 %-100 %] 100 % (08/07 1157) FiO2 (%):  [28 %] 28 % (08/07 0850)  General - Well nourished, well developed, in no apparent distress.  Ophthalmologic - fundi not visualized due to noncooperation.  Cardiovascular - Regular rhythm and rate.  Neuro - initially sleeping but easily arousable with eyes open, orientated to place and age, but not to time. No aphasia, hard of hearing, paucity of speech, but answer questions appropriately, following all simple commands. However, hypophonic today with soft voice. Able to name and repeat. No gaze palsy, tracking bilaterally, visual field full. Mild right nasolabial fold flattening. Tongue midline. RUE 3/5 with drift, LUE no drift. LLE no drift, but RLE 3/5 with drift and ankle DF/PF 3-/5 compared with 4+/5 on the left. Sensation symmetrical bilaterally subjectively, left FTN intact, gait not tested.     ASSESSMENT/PLAN Sierra [name] is a 88 y.o. female with history of hypertension, hyperlipidemia, dementia, ulcerative colitis, adrenal insufficiency on prednisone, GI bleeding admitted for  right-sided weakness and right facial droop. No tPA given due to rapid resolved symptoms.    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  Continue DAPT for 3 weeks and then aspirin. Ongoing aggressive stroke risk factor management Therapy recommendations: SNF Disposition: Pending  Hypertension Stable Avoid low BP gradually normalized within 5-7 days. Long term BP goal normotensive  Hyperlipidemia Home meds: None LDL 112, goal < 70 Now on Crestor 20 Continue statin at discharge  Dysphagia Speech on board On dys 2 and nectar thick liquis Currently able to take medication with applesauce. Poor po intake so far Check CBC and BMP Consider IVF or TF if not adequate intake - discussed with primary team  Leukocytosis WBC 11.5-10.6-15.4 UA  pending CXR 8/6 negative  Other Stroke Risk Factors Advanced age  Other Active Problems Ulcerative colitis, on mesalamine Adrenal insufficiency on prednisone Dementia on Namenda Recent rectal bleeding, but H&H stable at this time History of craniotomy, due to ? Meningioma  Hospital day # 1  Sierra Hawking, MD PhD Stroke Neurology 12/15/2021 1:40 PM    To contact Stroke Continuity provider, please refer to [email or web address] After hours, contact General Neurology

## 2021-12-16 DIAGNOSIS — I10 Essential (primary) hypertension: Secondary | ICD-10-CM

## 2021-12-16 DIAGNOSIS — Z9889 Other specified postprocedural states: Secondary | ICD-10-CM | POA: Diagnosis not present

## 2021-12-16 DIAGNOSIS — I63312 Cerebral infarction due to thrombosis of left middle cerebral artery: Secondary | ICD-10-CM | POA: Diagnosis not present

## 2021-12-16 DIAGNOSIS — E2740 Unspecified adrenocortical insufficiency: Secondary | ICD-10-CM | POA: Diagnosis not present

## 2021-12-16 LAB — BASIC METABOLIC PANEL
Anion gap: 10 (ref 5–15)
BUN: 19 mg/dL (ref 8–23)
CO2: 24 mmol/L (ref 22–32)
Calcium: 9.3 mg/dL (ref 8.9–10.3)
Chloride: 100 mmol/L (ref 98–111)
Creatinine, Ser: 0.86 mg/dL (ref 0.44–1.00)
GFR, Estimated: >60 mL/min (ref >60)
Glucose, Bld: 206 mg/dL — ABNORMAL HIGH (ref 70–99)
Potassium: 3.5 mmol/L (ref 3.5–5.1)
Sodium: 134 mmol/L — ABNORMAL LOW (ref 135–145)

## 2021-12-16 LAB — CBC
HCT: 39 % (ref 36.0–46.0)
Hemoglobin: 12.9 g/dL (ref 12.0–15.0)
MCH: 28.8 pg (ref 26.0–34.0)
MCHC: 33.1 g/dL (ref 30.0–36.0)
MCV: 87.1 fL (ref 80.0–100.0)
Platelets: 302 10*3/uL (ref 150–400)
RBC: 4.48 MIL/uL (ref 3.87–5.11)
RDW: 14.1 % (ref 11.5–15.5)
WBC: 15.7 10*3/uL — ABNORMAL HIGH (ref 4.0–10.5)
nRBC: 0 % (ref 0.0–0.2)

## 2021-12-16 MED ORDER — ALBUTEROL SULFATE (2.5 MG/3ML) 0.083% IN NEBU
2.5000 mg | INHALATION_SOLUTION | RESPIRATORY_TRACT | 12 refills | Status: AC | PRN
Start: 2021-12-16 — End: ?

## 2021-12-16 MED ORDER — ROSUVASTATIN CALCIUM 20 MG PO TABS: 20.0000 mg | ORAL_TABLET | Freq: Every day | ORAL | 3 refills | Status: DC

## 2021-12-16 MED ORDER — ASPIRIN 81 MG PO CHEW: 81.0000 mg | CHEWABLE_TABLET | Freq: Every day | ORAL | 3 refills | Status: DC

## 2021-12-16 MED ORDER — LEVOTHYROXINE SODIUM 75 MCG PO TABS: 75.0000 ug | ORAL_TABLET | Freq: Every day | ORAL | 3 refills | Status: AC

## 2021-12-16 MED ORDER — CLOPIDOGREL BISULFATE 75 MG PO TABS: 75.0000 mg | ORAL_TABLET | Freq: Every day | ORAL | 0 refills | Status: DC

## 2021-12-16 NOTE — Progress Notes (Signed)
Report called to [facility] at [facility].  Answered all questions.  Called [facility] for update on transport, and relayed information to daughter, [name].

## 2021-12-16 NOTE — Progress Notes (Signed)
STROKE TEAM PROGRESS NOTE   SUBJECTIVE (INTERVAL HISTORY) No family is at the bedside. Pt initially sleeping but easily arousable. Still has hypophonia, coughing intermittently, but neuro stable unchanged from yesterday. Cre. 0.86 and still has leukocytosis   OBJECTIVE Temp:  [98.1 F (36.7 C)-99.7 F (37.6 C)] 99.7 F (37.6 C) (08/08 1212) Pulse Rate:  [92-101] 94 (08/08 1212) Cardiac Rhythm: Normal sinus rhythm (08/08 0712) Resp:  [16-24] 16 (08/08 1212) BP: (117-166)/(59-95) 117/75 (08/08 1212) SpO2:  [93 %-96 %] 95 % (08/08 1212)  Recent Labs  Lab 12/13/21 0656 12/14/21 0848  GLUCAP 118* 172*   Recent Labs  Lab 12/13/21 0701 12/13/21 0702 12/14/21 0432 12/15/21 0622 12/16/21 0559  NA 137 139 135 134* 134*  K 3.5 3.9 3.5 3.5 3.5  CL 102 101 99 101 100  CO2 25  --  '24 25 24  '$ GLUCOSE 121* 120* 127* 224* 206*  BUN '13 19 11 12 19  '$ CREATININE 0.87 0.80 0.87 0.80 0.86  CALCIUM 9.1  --  9.4 9.1 9.3   Recent Labs  Lab 12/13/21 0701  AST 29  ALT 34  ALKPHOS 39  BILITOT 0.6  PROT 7.0  ALBUMIN 3.4*   Recent Labs  Lab 12/13/21 0701 12/13/21 0702 12/13/21 1055 12/13/21 1644 12/14/21 0432 12/15/21 0622 12/16/21 0559  WBC 11.5*  --   --   --  10.6* 15.4* 15.7*  NEUTROABS 8.6*  --   --   --   --   --   --   HGB 11.7*   < > 12.1 11.7* 12.6 12.8 12.9  HCT 37.1   < > 37.4 37.0 38.1 39.3 39.0  MCV 91.6  --   --   --  88.2 88.7 87.1  PLT 269  --   --   --  276 303 302   < > = values in this interval not displayed.   No results for input(s): "CKTOTAL", "CKMB", "CKMBINDEX", "TROPONINI" in the last 168 hours. No results for input(s): "LABPROT", "INR" in the last 72 hours.  Recent Labs    12/15/21 1339  COLORURINE AMBER*  LABSPEC 1.015  PHURINE 5.0  GLUCOSEU NEGATIVE  HGBUR SMALL*  BILIRUBINUR NEGATIVE  KETONESUR NEGATIVE  PROTEINUR 100*  NITRITE NEGATIVE  LEUKOCYTESUR MODERATE*       Component Value Date/Time   CHOL 205 (H) 12/13/2021 1055   TRIG 245  (H) 12/13/2021 1055   HDL 44 12/13/2021 1055   CHOLHDL 4.7 12/13/2021 1055   VLDL 49 (H) 12/13/2021 1055   LDLCALC 112 (H) 12/13/2021 1055   LDLCALC 133 (H) 02/01/2017 1128   Lab Results  Component Value Date   HGBA1C 6.4 (H) 12/13/2021   No results found for: "LABOPIA", "COCAINSCRNUR", "LABBENZ", "AMPHETMU", "THCU", "LABBARB"  Recent Labs  Lab 12/13/21 0700  ETH <10    I have personally reviewed the radiological images below and agree with the radiology interpretations.  DG Swallowing Func-Speech Pathology  Result Date: 12/15/2021 Table formatting from the original result was not included. Images from the original result were not included. Objective Swallowing Evaluation: Type of Study: MBS-Modified Barium Swallow Study  Patient Details Name: Sierra [name] MRN: [id number] Date of Birth: 10/20/1928 Today's Date: 12/15/2021 Time: SLP Start Time (ACUTE ONLY): 1330 -SLP Stop Time (ACUTE ONLY): 1350 SLP Time Calculation (min) (ACUTE ONLY): 20 min Past Medical History: Past Medical History: Diagnosis Date  Acute asthmatic bronchitis   Acute respiratory failure with hypoxia ([place]) 06/11/2016  Allergic rhinitis   Arthritis  Hx R shoulder bursitis, pain L knee and L hip  Asthmatic bronchitis   ATYPICAL MYCOBACTERIAL INFECTION 05/05/2010  Sputum cx Pos 12/ 2011    Balance problem   SINCE BRAIN TUMOR REMOVED IN 2002-BENIGN TUMOR-PT HAS ADRENAL INSUFFICIENCY AND TAKES DAILY PREDNISONE  Blood transfusion   Cardiac murmur   DOES NOT CAUSE ANY SYMPTOMS  Chronic kidney disease   Chronic pharyngitis   Colonic diverticular abscess 02/01/2017  Diverticulitis, colon   Dizziness   DOE (dyspnea on exertion)   Esophageal reflux   GI bleed 01/06/2013  Hyperlipemia   Hypertension   Hypothyroidism   IBS (irritable bowel syndrome)   Leukocytosis   LGI bleed 01/05/2013  Other specified iron deficiency anemias   PNEUMONIA 06/02/2010  Qualifier: Diagnosis of  By: Annamaria Boots MD, Clinton D   Postop Hyponatremia 07/28/2011  Rectal bleeding  01/03/2014  Recurrent upper respiratory infection (URI) 07/10/2011   ACUTE BRONCHITIS - extra prednisone in addition to the daily prednisone and a Z-Pak  Thrombocytosis   THRUSH 04/30/2009  Qualifier: Diagnosis of  By: Annamaria Boots MD, Clinton D   Ulceration, colon   Ulcerative colitis  Past Surgical History: Past Surgical History: Procedure Laterality Date  ABDOMINAL HYSTERECTOMY  1985  APPENDECTOMY    COLONOSCOPY N/A 01/05/2013  Procedure: COLONOSCOPY;  Surgeon: Cleotis Nipper, MD;  Location: [facility] ENDOSCOPY;  Service: Endoscopy;  Laterality: N/A;  COLONOSCOPY N/A 01/04/2014  Procedure: COLONOSCOPY;  Surgeon: Winfield Cunas., MD;  Location: [facility] ENDOSCOPY;  Service: Endoscopy;  Laterality: N/A;  CRANIOTOMY    DILATION AND CURETTAGE OF UTERUS  1967  ESOPHAGOGASTRODUODENOSCOPY N/A 01/05/2013  Procedure: ESOPHAGOGASTRODUODENOSCOPY (EGD);  Surgeon: Cleotis Nipper, MD;  Location: [facility] ENDOSCOPY;  Service: Endoscopy;  Laterality: N/A;  ESOPHAGOGASTRODUODENOSCOPY N/A 06/26/2014  Procedure: ESOPHAGOGASTRODUODENOSCOPY (EGD);  Surgeon: Winfield Cunas., MD;  Location: [facility] ENDOSCOPY;  Service: Endoscopy;  Laterality: N/A;  EYE SURGERY    2003 -RIGHT CATARACT EXTRACTED AND LEFT WAS DONE IN 2004  FLEXIBLE SIGMOIDOSCOPY N/A 06/26/2014  Procedure: FLEXIBLE SIGMOIDOSCOPY;  Surgeon: Winfield Cunas., MD;  Location: [facility] ENDOSCOPY;  Service: Endoscopy;  Laterality: N/A;  FRONTALIS SUSPENSION  10-09-2010  lifting eyelids AND SECOND EYE SURGERY November 19, 2010  meningioma resected  20O2  PARTIAL COLECTOMY  2008  subglottal mucocoel  2000  THYROIDECTOMY  1986  TONSILLECTOMY  1938  TOTAL HIP ARTHROPLASTY  OCT 2006  right  TOTAL HIP ARTHROPLASTY  07/27/2011  Procedure: TOTAL HIP ARTHROPLASTY;  Surgeon: Gearlean Alf, MD;  Location: [facility] ORS;  Service: Orthopedics;  Laterality: Left;  VESICOVAGINAL FISTULA CLOSURE W/ TAH    WRIST TENDON LESION REMOVED 2007   HPI: 88 y/o female admitted from SNF secondary to R sided weakness. MRI of the brain 8/5 was  negative for any acute findings. Increased weakness noted on 8/6; repeat MRI brain 8/6: Small acute infarct in the posterior limb left internal capsule. Coughing noted with thin liquids on 8/6 and pt made NPO thereafter. PMH including but not limited to advanced Alzheimer disease, depression, hypertension, hypothyroidism, asthma, ulcerative colitis, chronic adrenal insufficiency is on prednisone  Subjective: alert, distractible  Recommendations for follow up therapy are one component of a multi-disciplinary discharge planning process, led by the attending physician.  Recommendations may be updated based on patient status, additional functional criteria and insurance authorization. Assessment / Plan / Recommendation   12/15/2021   1:00 PM Clinical Impressions Clinical Impression Pt demonstrates a mild oral dysphagia characterized by oral holding with all boluses. There are instances  of trace spillage to the vallecular sinuses during oral hold. In most cases pt will initiate a swallow if given a verbal cue. She is capable of mastication. Pharyngeal phase WFL. No penetration or aspiration. Question if oral holding is a baseline impairment given diagnosis of Alzheimer disease. Recommend pt continue dys 2 solids and thin liquids. Will likely need supervision with meals. SLP will f/u to observe with a meal and determine appropriate supports as needed. SLP Visit Diagnosis Dysphagia, oral phase (R13.11)     12/15/2021   1:00 PM Treatment Recommendations Treatment Recommendations Therapy as outlined in treatment plan below     12/15/2021   1:00 PM Prognosis Prognosis for Safe Diet Advancement Good   12/15/2021   1:00 PM Diet Recommendations SLP Diet Recommendations Dysphagia 2 (Fine chop) solids;Thin liquid Liquid Administration via Cup;Straw Medication Administration Crushed with puree Compensations Other (Comment) Postural Changes Seated upright at 90 degrees     12/15/2021   1:00 PM Other Recommendations Oral Care Recommendations  Oral care BID Other Recommendations Have oral suction available Follow Up Recommendations Skilled nursing-short term rehab (<3 hours/day) Functional Status Assessment Patient has had a recent decline in their functional status and demonstrates the ability to make significant improvements in function in a reasonable and predictable amount of time.   12/15/2021   1:00 PM Frequency and Duration  Speech Therapy Frequency (ACUTE ONLY) min 2x/week Treatment Duration 2 weeks     12/15/2021   1:00 PM Oral Phase Oral Phase Impaired Oral - Nectar Teaspoon Delayed oral transit;Holding of bolus Oral - Nectar Straw Holding of bolus;Delayed oral transit Oral - Thin Cup Holding of bolus;Delayed oral transit Oral - Thin Straw Delayed oral transit;Holding of bolus;Premature spillage;Piecemeal swallowing Oral - Puree Holding of bolus;Delayed oral transit Oral - Regular Delayed oral transit;Holding of bolus Oral - Pill Piecemeal swallowing;Premature spillage    12/15/2021   1:00 PM Pharyngeal Phase Pharyngeal Phase Impaired Pharyngeal- Nectar Teaspoon WFL Pharyngeal- Nectar Straw WFL Pharyngeal- Thin Cup [facility] Pharyngeal- Thin Straw WFL Pharyngeal- Puree WFL Pharyngeal- Regular WFL Pharyngeal- Pill WFL     No data to display    DeBlois, Katherene Ponto 12/15/2021, 3:31 PM                     DG Chest 2 View  Result Date: 12/14/2021 CLINICAL DATA:  Shortness of breath. EXAM: CHEST - 2 VIEW COMPARISON:  12/13/2021 FINDINGS: Stable cardiomediastinal contours. Aortic atherosclerotic calcifications. Unchanged asymmetric elevation of the right hemidiaphragm. Chronic interstitial coarsening identified bilaterally. No signs of pleural effusion, interstitial edema or airspace consolidation. IMPRESSION: No active cardiopulmonary abnormalities. Electronically Signed   By: Kerby Moors M.D.   On: 12/14/2021 11:03   MR BRAIN WO CONTRAST  Result Date: 12/14/2021 CLINICAL DATA:  Neuro deficit with acute stroke suspected. EXAM: MRI HEAD WITHOUT  CONTRAST TECHNIQUE: Multiplanar, multiecho pulse sequences of the brain and surrounding structures were obtained without intravenous contrast. COMPARISON:  Yesterday FINDINGS: Brain: An acute small vessel infarct has become apparent in the posterior limb left internal capsule. Chronic small vessel ischemia in the cerebral white matter and pons. Chronic lacunar infarct in the left pons. Large area of encephalomalacia centered in the left low parietal region deep to a craniotomy. Generalized brain atrophy. Vascular: Normal flow voids. Skull and upper cervical spine: Unremarkable remote left parietal craniotomy. No focal marrow signal abnormality. Sinuses/Orbits: Bilateral cataract resection. IMPRESSION: 1. Small acute infarct in the posterior limb left internal capsule. 2. Chronic small vessel ischemia, brain  atrophy, and left parietal encephalomalacia. Electronically Signed   By: Jorje Guild M.D.   On: 12/14/2021 10:40   ECHOCARDIOGRAM COMPLETE  Result Date: 12/13/2021    ECHOCARDIOGRAM REPORT   Patient Name:   Sierra [name] Date of Exam: 12/13/2021 Medical Rec #:  [id number]      Height:       63.0 in Accession #:    [id number]     Weight:       155.6 lb Date of Birth:  1928/06/13     BSA:          1.738 m Patient Age:    25 years       BP:           153/75 mmHg Patient Gender: F              HR:           86 bpm. Exam Location:  Inpatient Procedure: 2D Echo, Cardiac Doppler and Color Doppler                              MODIFIED REPORT: This report was modified by Cherlynn Kaiser MD on 12/13/2021 due to revision.  Indications:     Stroke  History:         Patient has prior history of Echocardiogram examinations, most                  recent 12/17/2015. Risk Factors:Dyslipidemia and Hypertension.  Sonographer:     Jefferey Pica Referring Phys:  [id number] Velna Ochs Diagnosing Phys: Cherlynn Kaiser MD IMPRESSIONS  1. Left ventricular ejection fraction, by estimation, is 70 to 75%. The left ventricle has  hyperdynamic function. The left ventricle has no regional wall motion abnormalities. There is moderate left ventricular hypertrophy. Left ventricular diastolic parameters are indeterminate.  2. Right ventricular systolic function is normal. The right ventricular size is normal.  3. The mitral valve is degenerative. Mild to moderate mitral valve regurgitation. Mild mitral stenosis. The mean mitral valve gradient is 5.0 mmHg with average heart rate of 87 bpm. Severe mitral annular calcification.  4. The aortic valve is grossly normal. There is moderate calcification of the aortic valve. Aortic valve regurgitation is trivial. No aortic stenosis is present. FINDINGS  Left Ventricle: Left ventricular ejection fraction, by estimation, is 70 to 75%. The left ventricle has hyperdynamic function. The left ventricle has no regional wall motion abnormalities. The left ventricular internal cavity size was normal in size. There is moderate left ventricular hypertrophy. Left ventricular diastolic parameters are indeterminate. Right Ventricle: The right ventricular size is normal. No increase in right ventricular wall thickness. Right ventricular systolic function is normal. Left Atrium: Left atrial size was normal in size. Right Atrium: Right atrial size was normal in size. Pericardium: There is no evidence of pericardial effusion. Presence of epicardial fat layer. Mitral Valve: The mitral valve is degenerative in appearance. Severe mitral annular calcification. Mild to moderate mitral valve regurgitation. Mild mitral valve stenosis. MV peak gradient, 15.8 mmHg. The mean mitral valve gradient is 5.0 mmHg with average heart rate of 87 bpm. Tricuspid Valve: The tricuspid valve is normal in structure. Tricuspid valve regurgitation is mild . No evidence of tricuspid stenosis. Aortic Valve: The aortic valve is grossly normal. There is moderate calcification of the aortic valve. Aortic valve regurgitation is trivial. No aortic stenosis  is present. Aortic valve mean gradient measures 7.5 mmHg. Aortic valve  peak gradient measures  13.8 mmHg. Aortic valve area, by VTI measures 1.91 cm. Pulmonic Valve: The pulmonic valve was normal in structure. Pulmonic valve regurgitation is trivial. No evidence of pulmonic stenosis. Aorta: The aortic root is normal in size and structure. Ascending aorta measurements are within normal limits for age when indexed to body surface area. Venous: The inferior vena cava was not well visualized. IAS/Shunts: The interatrial septum was not well visualized.  LEFT VENTRICLE PLAX 2D LVIDd:         3.50 cm   Diastology LVIDs:         2.40 cm   LV e' lateral:   4.78 cm/s LV PW:         1.50 cm   LV E/e' lateral: 18.7 LV IVS:        1.40 cm LVOT diam:     1.80 cm LV SV:         67 LV SV Index:   39 LVOT Area:     2.54 cm  RIGHT VENTRICLE RV Basal diam:  2.70 cm RV S prime:     13.30 cm/s TAPSE (M-mode): 1.9 cm LEFT ATRIUM             Index        RIGHT ATRIUM           Index LA diam:        3.00 cm 1.73 cm/m   RA Area:     10.40 cm LA Vol (A2C):   53.4 ml 30.72 ml/m  RA Volume:   24.50 ml  14.10 ml/m LA Vol (A4C):   49.5 ml 28.48 ml/m LA Biplane Vol: 51.9 ml 29.86 ml/m  AORTIC VALVE                     PULMONIC VALVE AV Area (Vmax):    2.25 cm      PV Vmax:       1.12 m/s AV Area (Vmean):   2.00 cm      PV Peak grad:  5.0 mmHg AV Area (VTI):     1.91 cm AV Vmax:           185.69 cm/s AV Vmean:          126.528 cm/s AV VTI:            0.351 m AV Peak Grad:      13.8 mmHg AV Mean Grad:      7.5 mmHg LVOT Vmax:         164.00 cm/s LVOT Vmean:        99.200 cm/s LVOT VTI:          0.264 m LVOT/AV VTI ratio: 0.75  AORTA Ao Root diam: 3.30 cm Ao Asc diam:  3.70 cm MITRAL VALVE                TRICUSPID VALVE MV Area (PHT): 2.46 cm     TR Peak grad:   25.0 mmHg MV Peak grad:  15.8 mmHg    TR Vmax:        250.00 cm/s MV Mean grad:  5.0 mmHg MV Vmax:       1.98 m/s     SHUNTS MV Vmean:      102.5 cm/s   Systemic VTI:  0.26 m MV  Decel Time: 309 msec     Systemic Diam: 1.80 cm MV E velocity: 89.30 cm/s MV A velocity: 164.00 cm/s MV E/A ratio:  0.54 Cherlynn Kaiser MD Electronically signed by Cherlynn Kaiser MD Signature Date/Time: 12/13/2021/7:29:15 PM    Final (Updated)    CT ANGIO HEAD NECK W WO CM  Result Date: 12/13/2021 CLINICAL DATA:  Stroke follow-up EXAM: CT ANGIOGRAPHY HEAD AND NECK TECHNIQUE: Multidetector CT imaging of the head and neck was performed using the standard protocol during bolus administration of intravenous contrast. Multiplanar CT image reconstructions and MIPs were obtained to evaluate the vascular anatomy. Carotid stenosis measurements (when applicable) are obtained utilizing NASCET criteria, using the distal internal carotid diameter as the denominator. RADIATION DOSE REDUCTION: This exam was performed according to the departmental dose-optimization program which includes automated exposure control, adjustment of the mA and/or kV according to patient size and/or use of iterative reconstruction technique. CONTRAST:  107m OMNIPAQUE IOHEXOL 350 MG/ML SOLN COMPARISON:  Brain MRI from earlier today FINDINGS: CTA NECK FINDINGS Aortic arch: Atheromatous plaque.  Two vessel branching. Right carotid system: Mild atheromatous calcification at the bifurcation. No stenosis or ulceration. Left carotid system: Mild calcified plaque at the bifurcation. No stenosis or ulceration. Vertebral arteries: No proximal subclavian stenosis or ulceration. Atheromatous plaque at the left vertebral origin. No vertebral stenosis or beading Skeleton: Advanced and generalized cervical spine degeneration. No acute or focal finding. Unremarkable left parietal craniotomy Other neck: No acute finding Upper chest: No acute finding Review of the MIP images confirms the above findings CTA HEAD FINDINGS Anterior circulation: Atheromatous calcification of the carotid siphons. Advanced atheromatous irregularity of the MCA branches with diffuse mild to  moderate narrowings bilaterally. The right A1 segment is small and hypoplastic appearing. Negative for aneurysm Posterior circulation: Vertebral and basilar arteries are diffusely patent. Atheromatous irregularity of bilateral posterior cerebral arteries. Negative for aneurysm Venous sinuses: Unremarkable Anatomic variants: Unremarkable Review of the MIP images confirms the above findings IMPRESSION: 1. No emergent finding. 2. Atherosclerosis with advanced intracranial branch involvement. No proximal/correctable flow reducing stenosis. Electronically Signed   By: JJorje GuildM.D.   On: 12/13/2021 10:14   DG Chest Port 1 View  Result Date: 12/13/2021 CLINICAL DATA:  Cough. EXAM: PORTABLE CHEST 1 VIEW COMPARISON:  04/17/2020 FINDINGS: 0943 hours. Stable asymmetric elevation right hemidiaphragm. The cardio pericardial silhouette is enlarged. Interstitial markings are diffusely coarsened with chronic features. Bones are diffusely demineralized. Telemetry leads overlie the chest. IMPRESSION: Stable exam. No acute cardiopulmonary findings. Electronically Signed   By: EMisty StanleyM.D.   On: 12/13/2021 10:12   MR BRAIN WO CONTRAST  Result Date: 12/13/2021 CLINICAL DATA:  Right-sided weakness EXAM: MRI HEAD WITHOUT CONTRAST TECHNIQUE: Multiplanar, multiecho pulse sequences of the brain and surrounding structures were obtained without intravenous contrast. COMPARISON:  Head CT from earlier today FINDINGS: Brain: No acute infarction, hemorrhage, hydrocephalus, extra-axial collection or mass lesion. Chronic small vessel ischemia in the hemispheric white matter and pons. Brain atrophy that is generalized. Chronic left parietal and upper occipital encephalomalacia beneath the craniotomy site. Vascular: Normal flow voids. Skull and upper cervical spine: Unremarkable remote left parietal craniotomy. Sinuses/Orbits: Bilateral cataract resection IMPRESSION: No acute finding.  Stable atrophy and encephalomalacia.  Electronically Signed   By: JJorje GuildM.D.   On: 12/13/2021 10:03   CT HEAD CODE STROKE WO CONTRAST  Result Date: 12/13/2021 CLINICAL DATA:  Code stroke. Neuro deficit with acute stroke suspected. Right-sided weakness EXAM: CT HEAD WITHOUT CONTRAST TECHNIQUE: Contiguous axial images were obtained from the base of the skull through the vertex without intravenous contrast. RADIATION DOSE REDUCTION: This exam was performed according to the departmental  dose-optimization program which includes automated exposure control, adjustment of the mA and/or kV according to patient size and/or use of iterative reconstruction technique. COMPARISON:  12/31/2019 FINDINGS: Brain: No evidence of acute infarction, hemorrhage, hydrocephalus, extra-axial collection or mass lesion/mass effect. Encephalomalacia is noted in the left parietal lobe deep to a prior craniotomy. Generalized brain atrophy with chronic small vessel ischemia. Chronic lacunar infarct at the left thalamus when compared to prior. Vascular: No hyperdense vessel or unexpected calcification. Skull: Unremarkable left-sided craniotomy. Sinuses/Orbits: Bilateral cataract resection. Atheromatous calcification Other: Findings directly communicated with Dr. Lorrin Goodell at 7:08 am on 12/13/2021. ASPECTS ([place] Stroke Program Early CT Score) - Ganglionic level infarction (caudate, lentiform nuclei, internal capsule, insula, M1-M3 cortex): 7 - Supraganglionic infarction (M4-M6 cortex): 3-when accounting for chronic infarct Total score (0-10 with 10 being normal): 10 IMPRESSION: 1. Stable head CT.  No acute finding. 2. Chronic small vessel disease and left parietal encephalomalacia. 3. Generalized brain atrophy. Electronically Signed   By: Jorje Guild M.D.   On: 12/13/2021 07:09   CT Abdomen Pelvis W Contrast  Result Date: 11/20/2021 CLINICAL DATA:  Abdominal pain, acute, nonlocalized EXAM: CT ABDOMEN AND PELVIS WITH CONTRAST TECHNIQUE: Multidetector CT imaging of  the abdomen and pelvis was performed using the standard protocol following bolus administration of intravenous contrast. RADIATION DOSE REDUCTION: This exam was performed according to the departmental dose-optimization program which includes automated exposure control, adjustment of the mA and/or kV according to patient size and/or use of iterative reconstruction technique. CONTRAST:  152m OMNIPAQUE IOHEXOL 300 MG/ML  SOLN COMPARISON:  CT abdomen pelvis 08/25/2019 FINDINGS: Lower chest: Bibasilar atelectasis/scarring. Extensive mitral annular calcifications. Aortic valve calcifications. Hepatobiliary: Severe diffuse hepatic steatosis. Focal fatty sparing along the gallbladder fossa. There is a small adjacent hepatic cyst measuring 1.2 cm. The gallbladder is unremarkable. Pancreas: There is a 3.9 x 3.1 cm cystic lesion in the pancreatic body, which previously measured up to 3.5 x 2.9 cm in April 2021. There is a nodular enhancing lesion in the pancreatic tail measuring up to 1.0 cm, previously 1.2 cm. Spleen: Normal in size with multiple low-density lesions again seen, likely small cysts or hemangiomas. Adrenals/Urinary Tract: Adrenal glands are unremarkable. No hydronephrosis. There is a nonobstructive 4 mm stone in the right lower pole. Multiple small cystic lesions in the kidneys bilaterally, most of which are too small to characterize. There is a notable exophytic right upper pole renal cyst measuring 0.8 cm which is more dense in comparison to prior exam, measuring up to 93 Hounsfield units, previously 25 Hounsfield units. This persists on delayed without significant washout. This likely reflects a now hemorrhagic or proteinaceous cyst, as there is no significant change in size in comparison to prior exam in April 2021. No follow-up imaging is recommended. Bladder is markedly distended with some intraluminal gas. Stomach/Bowel: The stomach is within normal limits. There is no evidence of bowel obstruction.Prior  appendectomy. Colonic diverticulosis. No evidence of acute diverticulitis. Vascular/Lymphatic: No lymphadenopathy. Aortoiliac atherosclerosis. No AAA. Reproductive: Prior hysterectomy. Other: No hernia. No ascites. No free air. Atrophic anterior abdominal wall musculature. Musculoskeletal: There is a T11 compression fracture with approximately 50% height loss, reported on prior MRI 10/01/2020 with 10-20% height loss at that time (images not retrievable for direct comparison). Unchanged height loss of L5. There are multilevel degenerative changes of the spine with trace retrolisthesis at L4-L5. Total hip arthroplasties bilaterally are unchanged. IMPRESSION: Marked distension of the urinary bladder with intraluminal gas, correlate with history of need for bladder catheterizations.  Colonic diverticulosis without evidence of acute diverticulitis. Hepatic steatosis with fatty sparing along the gallbladder fossa. Slightly increased size of a cystic lesion in the pancreatic body measuring up to 3.9 x 3.1 cm, previously 3.5 x 2.9 cm in April 2021. No significant change in the small hyperdense mass in the pancreatic tail, which measures up to 1.0 cm. Chronic T11 compression fracture with approximately 50% height loss, reported on prior MRI 10/01/2020 with 10-20% height loss at that time (images not retrievable for direct comparison), suggesting progressive height loss. Additional chronic findings as described above. Electronically Signed   By: Maurine Simmering M.D.   On: 11/20/2021 11:12     PHYSICAL EXAM  Temp:  [98.1 F (36.7 C)-99.7 F (37.6 C)] 99.7 F (37.6 C) (08/08 1212) Pulse Rate:  [92-101] 94 (08/08 1212) Resp:  [16-24] 16 (08/08 1212) BP: (117-166)/(59-95) 117/75 (08/08 1212) SpO2:  [93 %-96 %] 95 % (08/08 1212)  General - Well nourished, well developed, in no apparent distress.  Ophthalmologic - fundi not visualized due to noncooperation.  Cardiovascular - Regular rhythm and rate.  Neuro -  initially sleeping but easily arousable with eyes open, orientated to place and age, but not to time. No aphasia, hard of hearing, paucity of speech, but answer questions appropriately, following all simple commands. However, hypophonic today with soft voice. Able to name and repeat. No gaze palsy, tracking bilaterally, visual field full. Mild right nasolabial fold flattening. Tongue midline. Intermittent coughing. RUE 3/5 with drift, LUE no drift. LLE no drift, but RLE 3/5 with drift and ankle DF/PF 3-/5 compared with 4+/5 on the left. Sensation symmetrical bilaterally subjectively, left FTN intact, gait not tested.     ASSESSMENT/PLAN Ms. KENZINGTON MIELKE is a 88 y.o. female with history of hypertension, hyperlipidemia, dementia, ulcerative colitis, adrenal insufficiency on prednisone, GI bleeding admitted for right-sided weakness and right facial droop. No tPA given due to rapid resolved symptoms.    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  Continue DAPT for 3 weeks and then aspirin. Ongoing aggressive stroke risk factor management Therapy recommendations: SNF Disposition: Pending  Hypertension Stable Avoid low BP gradually normalized within 5-7 days. Long term BP goal normotensive  Hyperlipidemia Home meds: None LDL 112, goal < 70 Now on Crestor 20 Continue statin at discharge  Dysphagia Speech on board On dys 2 and nectar thick liquis Currently able to take medication with applesauce. Poor po intake so far Check CBC and BMP Consider IVF or TF if not adequate intake - discussed with primary  team  UTI leukocytosis WBC 11.5-10.6-15.4-15.7 UA WBC > 50 CXR 8/6 negative Afebrile  Management per primary team regarding further work up with urine culture or treatment with antibiotics  Other Stroke Risk Factors Advanced age  Other Active Problems Ulcerative colitis, on mesalamine Adrenal insufficiency on prednisone Dementia on Namenda Recent rectal bleeding, but H&H stable at this time History of craniotomy, due to ? Meningioma  Hospital day # 2  Neurology will sign off. Please call with questions. Pt will follow up with stroke clinic NP at [facility] in about 4 weeks. Thanks for the  consult.   Rosalin Hawking, MD PhD Stroke Neurology 12/16/2021 12:22 PM    To contact Stroke Continuity provider, please refer to [email or web address] After hours, contact General Neurology

## 2021-12-16 NOTE — Progress Notes (Signed)
Occupational Therapy Treatment Patient Details Name: Sierra [name] MRN: [id number] DOB: 01/13/1929 Today's Date: 12/16/2021   History of present illness Pt is a 88 y/o female admitted from SNF secondary to R sided weakness. Repeat MRI of the brain revealed L internal capsule infarct. PMH significant for advanced Alzheimer disease, depression, hypertension, hypothyroidism, asthma, ulcerative colitis, chronic adrenal insufficiency on prednisone.   OT comments  Pt progressing towards established OT goals. Seen in collaboration with PT to optimize benefit. Pt appeared fatigued this session with decreased activity tolerance (had just been cleaned by nursing staff). Pt performing grooming with Max A with RUE. Pt requiring mod-max A+2 for sit<>stand transfers. Pt continues to be limited by activity tolerance, balance, strength, problem solving, awareness, and ability to follow commands. Pt would benefit from continued OT services at SNF to optimize safety and independence in ADL. Will continue to follow acutely.    Recommendations for follow up therapy are one component of a multi-disciplinary discharge planning process, led by the attending physician.  Recommendations may be updated based on patient status, additional functional criteria and insurance authorization.    Follow Up Recommendations  Skilled nursing-short term rehab (<3 hours/day)    Assistance Recommended at Discharge Frequent or constant Supervision/Assistance  Patient can return home with the following  A lot of help with walking and/or transfers;A lot of help with bathing/dressing/bathroom;Assist for transportation;Help with stairs or ramp for entrance;Direct supervision/assist for medications management;Direct supervision/assist for financial management   Equipment Recommendations  None recommended by OT    Recommendations for Other Services      Precautions / Restrictions Precautions Precautions: Fall Precaution Comments:  incontinent Restrictions Weight Bearing Restrictions: No       Mobility Bed Mobility Overal bed mobility: Needs Assistance Bed Mobility: Supine to Sit, Sit to Supine     Supine to sit: Mod assist, +2 for physical assistance Sit to supine: Max assist, +2 for physical assistance   General bed mobility comments: step-by-step verbal cues; physical A to raise trunk. Physical A to bring BLE back int bed and control trunk descent.    Transfers Overall transfer level: Needs assistance Equipment used: 2 person hand held assist Transfers: Sit to/from Stand Sit to Stand: Mod assist, Max assist, +2 physical assistance           General transfer comment: mod-max A +2 for sit<>stand     Balance Overall balance assessment: Needs assistance Sitting-balance support: Bilateral upper extremity supported, Single extremity supported Sitting balance-Leahy Scale: Poor Sitting balance - Comments: min-max A to maintain upright position. Max verbal cues for sitting posture   Standing balance support: Bilateral upper extremity supported Standing balance-Leahy Scale: Zero Standing balance comment: heavy reliance on PT/OT for support                           ADL either performed or assessed with clinical judgement   ADL Overall ADL's : Needs assistance/impaired     Grooming: Wash/dry face;Maximal assistance;Sitting Grooming Details (indicate cue type and reason): Mod-max A to initiate movement with RUE and to sustain RUE at face                 Toilet Transfer: Maximal assistance;+2 for physical assistance Toilet Transfer Details (indicate cue type and reason): Sit<>stand in preparation for participation in ADL. Additionally squat pivot to achieve higher position in bed.           General ADL Comments: Pt requires step by  step commands for ADL. Pt limited by activity tolerance, strength, and balance. Pt washing face and focus session on sitting balance EOB to achieve  upright posture as well as sit<>stand transfer    Extremity/Trunk Assessment Upper Extremity Assessment Upper Extremity Assessment: RUE deficits/detail RUE Deficits / Details: 3+/5 hand squeeze; shoulder flexion ~150 AAROM, but max difficutly initiating shoulder flexion herself. RUE Coordination: decreased fine motor;decreased gross motor   Lower Extremity Assessment Lower Extremity Assessment: Defer to PT evaluation        Vision   Vision Assessment?: No apparent visual deficits   Perception Perception Perception: Not tested   Praxis Praxis Praxis: Impaired Praxis Impairment Details: Motor planning    Cognition Arousal/Alertness: Awake/alert Behavior During Therapy: Flat affect Overall Cognitive Status: History of cognitive impairments - at baseline Area of Impairment: Orientation, Attention, Memory, Following commands, Safety/judgement, Awareness, Problem solving                 Orientation Level: Disoriented to, Place, Time, Situation Current Attention Level: Focused Memory: Decreased short-term memory Following Commands: Follows one step commands inconsistently, Follows one step commands with increased time Safety/Judgement: Decreased awareness of deficits, Decreased awareness of safety Awareness: Intellectual Problem Solving: Slow processing, Decreased initiation, Difficulty sequencing, Requires verbal cues, Requires tactile cues General Comments: Pt following step-by-step commands with increased time. Pt with difficulty motor planning to follow commands throughout session. Pt with difficulty problem solving to achieve desired actions, requiring verbal cues for sequencing throughout. Appears to have decreased attn to R side of environment. Minimal verbalizations. Reporting her name is Sierra [name], and occasionally attempting to respond yes/no        Exercises Exercises: Other exercises Other Exercises Other Exercises: LUE shoulder flexion 10x (AAROM) Other Exercises:  LUE gross grasp 5x Other Exercises: LUE elbow flexion 10x (AAROM) Other Exercises: Pt taking pen from therapist and handing to other therapist from L to R. Pt with max difficulty initiating movement    Shoulder Instructions       General Comments HR elevating to 113 sitting EOB. Observed with increased WOB edge of bed, but O2 at 92 SpO2    Pertinent Vitals/ Pain       Pain Assessment Pain Assessment: Faces Faces Pain Scale: Hurts a little bit Pain Location: Generalized discomfort with movement Pain Descriptors / Indicators: Discomfort Pain Intervention(s): Monitored during session  Home Living                                          Prior Functioning/Environment              Frequency  Min 2X/week        Progress Toward Goals  OT Goals(current goals can now be found in the care plan section)  Progress towards OT goals: Progressing toward goals  Acute Rehab OT Goals Patient Stated Goal: Unable to state OT Goal Formulation: Patient unable to participate in goal setting Potential to Achieve Goals: Fair ADL Goals Pt Will Perform Eating: with set-up;sitting;bed level Pt Will Perform Grooming: with min assist;sitting;bed level Pt Will Transfer to Toilet: with max assist;with +2 assist;squat pivot transfer;bedside commode Pt/caregiver will Perform Home Exercise Program: Increased ROM;Increased strength;Right Upper extremity;With written HEP provided  Plan Discharge plan remains appropriate    Co-evaluation      Reason for Co-Treatment: For patient/therapist safety          AM-PAC OT "6  Clicks" Daily Activity     Outcome Measure   Help from another person eating meals?: Total Help from another person taking care of personal grooming?: Total Help from another person toileting, which includes using toliet, bedpan, or urinal?: Total Help from another person bathing (including washing, rinsing, drying)?: Total Help from another person to put  on and taking off regular upper body clothing?: A Lot Help from another person to put on and taking off regular lower body clothing?: Total 6 Click Score: 7    End of Session Equipment Utilized During Treatment: Gait belt  OT Visit Diagnosis: Unsteadiness on feet (R26.81);Muscle weakness (generalized) (M62.81)   Activity Tolerance Patient tolerated treatment well   Patient Left in bed;with call bell/phone within reach;with bed alarm set;with nursing/sitter in room   Nurse Communication Mobility status        Time: 6063-0160 OT Time Calculation (min): 23 min  Charges: OT General Charges $OT Visit: 1 Visit OT Treatments $Self Care/Home Management : 8-22 mins  Shanda Howells, OTR/L [facility] Acute Rehabilitation Office: [phone]   Lula Olszewski 12/16/2021, 3:53 PM

## 2021-12-16 NOTE — Progress Notes (Signed)
Patient picked up by [facility], all belongings sent with patient at 2055.

## 2021-12-16 NOTE — TOC Transition Note (Signed)
Transition of Care [facility]) - CM/SW Discharge Note   Patient Details  Name: Sierra [name] MRN: [id number] Date of Birth: 1929/03/24  Transition of Care [facility] CM/SW Contact:  Geralynn Ochs, LCSW Phone Number: 12/16/2021, 1:16 PM   Clinical Narrative:   CSW confirmed that insurance authorization was approved for SNF. CSW confirmed with MD that patient is medically stable to transition to SNF today. CSW updated [place], and then sent discharge summary when it was completed. CSW updated daughter, [name], via phone, who is traveling up here today to visit. Transport scheduled with [facility] for next available.  Nurse to call report to [phone], Room 130.    Final next level of care: Skilled Nursing Facility Barriers to Discharge: Barriers Resolved   Patient Goals and CMS Choice Patient states their goals for this hospitalization and ongoing recovery are:: patient unable to participate in goal setting, not oriented CMS Medicare.gov Compare Post Acute Care list provided to:: Patient Represenative (must comment) Choice offered to / list presented to : Adult Children  Discharge Placement              Patient chooses bed at: [facility] Patient to be transferred to facility by: [place] Name of family member notified: [name] Patient and family notified of of transfer: 12/16/21  Discharge Plan and Services     Post Acute Care Choice: [place]                               Social Determinants of Health (SDOH) Interventions     Readmission Risk Interventions     No data to display

## 2021-12-16 NOTE — Progress Notes (Signed)
Physical Therapy Treatment Patient Details Name: Sierra [name] MRN: [id number] DOB: 13-Oct-1928 Today's Date: 12/16/2021   History of Present Illness Pt is a 88 y/o female admitted from SNF secondary to R sided weakness. Repeat MRI of the brain revealed L internal capsule infarct. PMH significant for advanced Alzheimer disease, depression, hypertension, hypothyroidism, asthma, ulcerative colitis, chronic adrenal insufficiency on prednisone.    PT Comments    Pt with gradual progress. She appeared very fatigued during session , but had just been cleaned by nursing staff.  She required mod -max multimodal cues for transfers with mod-mod of 2 for transfers.  Pt was not able to take any steps but again seemed fatigued.  Continue to progress as able.     Recommendations for follow up therapy are one component of a multi-disciplinary discharge planning process, led by the attending physician.  Recommendations may be updated based on patient status, additional functional criteria and insurance authorization.  Follow Up Recommendations  Skilled nursing-short term rehab (<3 hours/day) Can patient physically be transported by private vehicle: No   Assistance Recommended at Discharge Frequent or constant Supervision/Assistance  Patient can return home with the following Two people to help with walking and/or transfers;Two people to help with bathing/dressing/bathroom;Assistance with feeding;Direct supervision/assist for medications management   Equipment Recommendations  None recommended by PT    Recommendations for Other Services       Precautions / Restrictions Precautions Precautions: Fall     Mobility  Bed Mobility Overal bed mobility: Needs Assistance Bed Mobility: Rolling, Supine to Sit, Sit to Supine Rolling: Min assist   Supine to sit: Mod assist Sit to supine: Mod assist, +2 for physical assistance   General bed mobility comments: Max verbal cues for step by step sequencing.     Transfers Overall transfer level: Needs assistance Equipment used: 2 person hand held assist Transfers: Sit to/from Stand Sit to Stand: Mod assist, +2 physical assistance           General transfer comment: Stood x 2 at EOB with mod A x 2 and cues to stand upright. Pt appeared very fatigued and not able to take any steps    Ambulation/Gait                   Stairs             Wheelchair Mobility    Modified Rankin (Stroke Patients Only) Modified Rankin (Stroke Patients Only) Pre-Morbid Rankin Score: Moderately severe disability Modified Rankin: Severe disability     Balance Overall balance assessment: Needs assistance Sitting-balance support: Bilateral upper extremity supported Sitting balance-Leahy Scale: Poor Sitting balance - Comments: Requiring mod A majority of time with forward and R lean.  Brief episode of maintaining upright with close guarding and max cues.  Max cues to look up.     Standing balance-Leahy Scale: Zero                              Cognition Arousal/Alertness: Awake/alert Behavior During Therapy: Flat affect Overall Cognitive Status: History of cognitive impairments - at baseline                                 General Comments: Pt able to state her name. Speaks softly.  Follows most commands with increased time.  Spoke very little during session  Exercises General Exercises - Lower Extremity Ankle Circles/Pumps: AROM, Both, 5 reps, Supine Long Arc Quad: AROM, Both, 5 reps, Supine Hip ABduction/ADduction: AROM, Both, 5 reps, Supine (pillow squeeze) Other Exercises Other Exercises: increased time, multimodal cues for all    General Comments        Pertinent Vitals/Pain Pain Assessment Pain Assessment: No/denies pain    Home Living                          Prior Function            PT Goals (current goals can now be found in the care plan section) Progress towards PT  goals: Progressing toward goals    Frequency    Min 3X/week      PT Plan Current plan remains appropriate    Co-evaluation PT/OT/SLP Co-Evaluation/Treatment: Yes Reason for Co-Treatment: For patient/therapist safety          AM-PAC PT "6 Clicks" Mobility   Outcome Measure  Help needed turning from your back to your side while in a flat bed without using bedrails?: A Little Help needed moving from lying on your back to sitting on the side of a flat bed without using bedrails?: A Lot Help needed moving to and from a bed to a chair (including a wheelchair)?: Total Help needed standing up from a chair using your arms (e.g., wheelchair or bedside chair)?: Total Help needed to walk in hospital room?: Total Help needed climbing 3-5 steps with a railing? : Total 6 Click Score: 9    End of Session Equipment Utilized During Treatment: Gait belt Activity Tolerance: Patient limited by fatigue Patient left: in bed;with call bell/phone within reach;with bed alarm set Nurse Communication: Mobility status PT Visit Diagnosis: Other abnormalities of gait and mobility (R26.89);Other symptoms and signs involving the nervous system (R29.898)     Time: 0177-9390 PT Time Calculation (min) (ACUTE ONLY): 23 min  Charges:  $Therapeutic Activity: 8-22 mins                     Abran Richard, PT Acute Rehab [facility] [phone]    Karlton Lemon 12/16/2021, 3:45 PM

## 2021-12-16 NOTE — Discharge Summary (Addendum)
Name: Sierra [name] MRN: [id number] DOB: 08/19/1928 88 y.o. PCP: Patient, No Pcp Per  Date of Admission: 12/13/2021  6:54 AM Date of Discharge: 12/16/21 Attending Physician: Sierra Mussel, MD  Discharge Diagnosis: 1. Principal Problem:   Acute right-sided weakness Active Problems:   HTN (hypertension)   Hypothyroidism   Adrenal insufficiency (HCC)   Asthma, chronic   Alzheimer's dementia ([place])   History of meningioma   History of craniotomy   Complex care coordination   Discharge Medications: Allergies as of 12/16/2021       Reactions   Aricept [donepezil] Other (See Comments)   Not specified on MAR   Sulfamethoxazole Nausea And Vomiting   Tape Other (See Comments)   TAPE TEARS AND BRUISES THE SKIN!!   Biaxin [clarithromycin] Other (See Comments)   Bitter taste   Ciprofloxacin Rash   Codeine Nausea And Vomiting   Dilantin [phenytoin] Rash   Doxycycline Nausea Only   Lactose Intolerance (gi) Other (See Comments)   gas   Restasis [cyclosporine] Other (See Comments), Swelling   Eyes burn   Sulfonamide Derivatives Rash        Medication List     STOP taking these medications    Bion Tears 0.1-0.3 % Soln   Calcium 600-D 600-400 MG-UNIT Tabs Generic drug: Calcium Carbonate-Vitamin D3   guaiFENesin 600 MG 12 hr tablet Commonly known as: MUCINEX   omeprazole 20 MG capsule Commonly known as: PRILOSEC   oxybutynin 10 MG 24 hr tablet Commonly known as: DITROPAN-XL   oxymetazoline 0.05 % nasal spray Commonly known as: AFRIN   polyethylene glycol 17 g packet Commonly known as: MIRALAX / GLYCOLAX       TAKE these medications    albuterol (2.5 MG/3ML) 0.083% nebulizer solution Commonly known as: PROVENTIL Inhale 3 mLs (2.5 mg total) into the lungs every 4 (four) hours as needed for wheezing or shortness of breath.   amLODipine 2.5 MG tablet Commonly known as: NORVASC Take 2.5 mg by mouth daily.   aspirin 81 MG chewable tablet Chew 1 tablet (81 mg  total) by mouth daily. Start taking on: December 17, 2021   clopidogrel 75 MG tablet Commonly known as: PLAVIX Take 1 tablet (75 mg total) by mouth daily. Start taking on: December 17, 2021   escitalopram 20 MG tablet Commonly known as: LEXAPRO Take 20 mg by mouth daily.   ferrous sulfate 325 (65 FE) MG tablet Take 325 mg by mouth daily with breakfast.   lactobacillus acidophilus Tabs tablet Take 1 tablet by mouth 2 (two) times daily. For 10 days starting 11-17-21 After 10 days continue taking 1 tablet daily.   levothyroxine 75 MCG tablet Commonly known as: Synthroid Take 1 tablet (75 mcg total) by mouth daily at 6 (six) AM. Start taking on: December 17, 2021 What changed: when to take this   memantine 5 MG tablet Commonly known as: NAMENDA Take 5 mg by mouth 2 (two) times daily.   mesalamine 1.2 g EC tablet Commonly known as: LIALDA Take 1.2 g by mouth daily with breakfast.   predniSONE 5 MG tablet Commonly known as: DELTASONE Take 5 mg by mouth daily with breakfast.   rosuvastatin 20 MG tablet Commonly known as: CRESTOR Take 1 tablet (20 mg total) by mouth daily. Start taking on: December 17, 2021   Symbicort 160-4.5 MCG/ACT inhaler Generic drug: budesonide-formoterol Inhale 2 puffs into the lungs 2 (two) times daily.        Disposition and follow-up:   [name] was discharged from [facility] in Stable condition.  At the hospital follow up visit please address:  1.  Any stroke symptoms, HTN follow up, and memory testing  2.  Labs / imaging needed at time of follow-up: BMP, Lipid Panel  3.  Pending labs/ test needing follow-up: N/A  Follow-up Appointments:  Contact information for follow-up providers     [facility]. Schedule an appointment as soon as possible for a visit in 1 month(s).   Specialty: Neurology Why: stroke clinic Contact information: [place] [phone]             Contact information for after-discharge care     Destination     [facility] .   Service: Skilled Nursing Contact information: [place] [facility] Course by problem list:  Sierra [name] is a 88 year old female with past medical history of advanced Alzheimer disease, depression, hypertension, hypothyroidism, asthma, ulcerative colitis, chronic adrenal insufficiency is on prednisone who presents to the hospital for acute episode of right-sided weakness and facial drooping, admitted for management of CVA.   Acute lacunar infarct, capsular warning syndrome Right-sided weakness and facial droop Pt initially presented with right sided weakness and facial droop that seemed to resolve. CT head, MRI brain, CTA head and neck were unremarkable. Second day of admission, pt with redevelopment of facial droop, dysarthria, and trouble with word finding. Repeat MRI brain showing small acute infarct in the posterior limb left internal capsule. Chronic small vessel ischemia, brain atrophy, and left parietal encephalomalacia. Symptoms are consistent with capsular warning syndrome given transient symptoms. Started DAPT with Plavix '75mg'$  daily for 3 weeks and lifelong ASA '81mg'$  following neuro recommendations. Pt with a history of GI bleed 2/2 to UC will need further monitoring for bleeding risk. A1c 6.4. LDL 112, increased Crestor to '20mg'$ . Echo showing EF of 75%. [place]. Mild to mod MVR. Mild MS. Mod. calcification of aortic valve. Held Amlodipine while in patient to allow for permissive hypertension, pt able to restart at discharge.   Hypertension Held amlodipine for while in patient to allow for permissive hypertension. Restart at discharge.   Hypothyroidism TSH 3.3. Continue levothyroxine 75 mcg   Chronic adrenal insufficiency Continue prednisone 5 mg daily    Chronic ulcerative colitis Continue mesalamine   Normocytic anemia Recent history of lower GI bleed Hemoglobin is stable. Patient denies signs of bleeding. Continue iron supplement.   Chronic stable asthma Continue ICS-LABA and albuterol as needed   Depression Continue Lexapro   Advanced Alzheimer's disease Continue memantine   Discharge Exam:   BP 117/75 (BP Location: Left Arm)   Pulse 94   Temp 99.7 F (37.6 C) (Oral)   Resp 16   Ht '5\' 3"'$  (1.6 m)   Wt 70.6 kg   SpO2 95%   BMI 27.57 kg/m  Discharge exam:   General: Elderly appearing woman lying in bed, in no acute distress HEENT: Normocephalic, atraumatic, EOM intact, conjunctiva normal CV: Regular rate and rhythm, no murmurs rubs or gallops Pulm: Clear to auscultation bilaterally, normal respiratory effort Abdomen: Soft, non tender. Normal BS. MSK: Moving all extremities equally. No edema. Distal pulses 2+ Skin: Warm and dry. No rash Neuro: Alert and oriented x2 to person and place. Strength was 4/5 strength on right,  5/5 on left side. Sensation intact. Dysathria noted. Tongue protrudes midline. Facial droop noted. Eyebrow raise equal. Psych: Mood and Affect are normal  Pertinent Labs, Studies, and Procedures:     Latest Ref Rng & Units 12/16/2021    5:59 AM 12/15/2021    6:22 AM 12/14/2021    4:32 AM  CBC  WBC 4.0 - 10.5 K/uL 15.7  15.4  10.6   Hemoglobin 12.0 - 15.0 g/dL 12.9  12.8  12.6   Hematocrit 36.0 - 46.0 % 39.0  39.3  38.1   Platelets 150 - 400 K/uL 302  303  276         Latest Ref Rng & Units 12/16/2021    5:59 AM 12/15/2021    6:22 AM 12/14/2021    4:32 AM  BMP  Glucose 70 - 99 mg/dL 206  224  127   BUN 8 - 23 mg/dL '19  12  11   '$ Creatinine 0.44 - 1.00 mg/dL 0.86  0.80  0.87   Sodium 135 - 145 mmol/L 134  134  135   Potassium 3.5 - 5.1 mmol/L 3.5  3.5  3.5   Chloride 98 - 111 mmol/L 100  101  99   CO2 22 - 32 mmol/L '24  25  24   '$ Calcium 8.9 - 10.3 mg/dL 9.3  9.1  9.4      Discharge  Instructions:   Please follow up with your primary doctor following hospitalization for an acute stroke and further medication management.   Please follow up with Neurology at their stroke clinic at [facility]. Please call to set up an appointment. Discharge Instructions     Ambulatory referral to Neurology   Complete by: As directed    Follow up with stroke clinic NP (Jessica Vanschaick or Cecille Rubin, if both not available, consider Zachery Dauer, or A[place]) at [facility] in about 4 weeks. Thanks.   Call MD for:  severe uncontrolled pain   Complete by: As directed    Call MD for:  temperature >100.4   Complete by: As directed    Diet - low sodium heart healthy   Complete by: As directed    Increase activity slowly   Complete by: As directed        Signed: Drucie Opitz, MD 12/16/2021, 2:42 PM   Pager: '@[phone]'$ [phone]

## 2021-12-16 NOTE — Discharge Instructions (Addendum)
Follow Up and Discharge  You were seen in the hospital for right sided weakness and facial droop and found to have a stroke. You have been started on Plavix 75 mg for 3 weeks and aspirin 81 mg to take daily.   We have also increased your Crestor to 20 mg for enhanced lipid control.   Please follow up with your primary doctor following hospitalization for an acute stroke and further medication management.   Please follow up with Neurology at their stroke clinic at [facility]. Please call to set up an appointment.  Take care.

## 2022-01-20 ENCOUNTER — Inpatient Hospital Stay: Payer: Medicare Other | Admitting: Adult Health

## 2022-02-03 NOTE — Progress Notes (Unsigned)
[facility] [address] [place]. [place] [address] [phone]       HOSPITAL FOLLOW UP NOTE  Ms. [name] Date of Birth:  10/30/1928 Medical Record Number:  [id number]   Reason for Referral:  hospital stroke follow up    SUBJECTIVE:   CHIEF COMPLAINT:  No chief complaint on file.   HPI:   Ms. Sierra [name] is a 88 y.o. female with history of hypertension, hyperlipidemia, hx of craniotomy d/t ?  Meningioma, dementia, ulcerative colitis, adrenal insufficiency on prednisone, and recent GI bleeding who presented on 12/13/2021 with right-sided weakness and right facial droop.  Personally reviewed hospitalization pertinent progress notes, lab work and imaging. CTH no acute abnormality.  Initial MRI unremarkable.  Repeat MRI 8/6 showed left PLIC small infarct.  CTA head/neck unremarkable.  EF 70 to 75%.  LDL 112.  A1c 6.4.  Evaluated by Dr. Erlinda Hong for left PLIC infarct with capsular warning syndrome likely secondary to small vessel disease.  Recommended DAPT for 3 weeks and aspirin alone as well as Crestor 20 mg daily.  Residual deficits of right-sided weakness and dysphagia.  Therapies recommended SNF for ongoing therapy needs        PERTINENT IMAGING  Per hospitalization 12/13/2021 Right facial droop and right hemiparesis CT no acute abnormality MRI 8/5 no acute finding MRI repeat 8/6 left PLIC small infarct CTA head and neck unremarkable 2D Echo EF 70 to 75% LDL 112 HgbA1c 6.4    ROS:   14 system review of systems performed and negative with exception of ***  PMH:  Past Medical History:  Diagnosis Date   Acute asthmatic bronchitis    Acute respiratory failure with hypoxia (HCC) 06/11/2016   Allergic rhinitis    Arthritis    Hx R shoulder bursitis, pain L knee and L hip   Asthmatic bronchitis    ATYPICAL MYCOBACTERIAL INFECTION 05/05/2010   Sputum cx Pos 12/ 2011     Balance problem    SINCE BRAIN TUMOR REMOVED IN 2002-BENIGN TUMOR-PT HAS  ADRENAL INSUFFICIENCY AND TAKES DAILY PREDNISONE   Blood transfusion    Cardiac murmur    DOES NOT CAUSE ANY SYMPTOMS   Chronic kidney disease    Chronic pharyngitis    Colonic diverticular abscess 02/01/2017   Diverticulitis, colon    Dizziness    DOE (dyspnea on exertion)    Esophageal reflux    GI bleed 01/06/2013   Hyperlipemia    Hypertension    Hypothyroidism    IBS (irritable bowel syndrome)    Leukocytosis    LGI bleed 01/05/2013   Other specified iron deficiency anemias    PNEUMONIA 06/02/2010   Qualifier: Diagnosis of  By: Annamaria Boots MD, Clinton D    Postop Hyponatremia 07/28/2011   Rectal bleeding 01/03/2014   Recurrent upper respiratory infection (URI) 07/10/2011    ACUTE BRONCHITIS - extra prednisone in addition to the daily prednisone and a Z-Pak   Thrombocytosis    THRUSH 04/30/2009   Qualifier: Diagnosis of  By: Annamaria Boots MD, Clinton D    Ulceration, colon    Ulcerative colitis     PSH:  Past Surgical History:  Procedure Laterality Date   ABDOMINAL HYSTERECTOMY  1985   APPENDECTOMY     COLONOSCOPY N/A 01/05/2013   Procedure: COLONOSCOPY;  Surgeon: Cleotis Nipper, MD;  Location: [facility] ENDOSCOPY;  Service: Endoscopy;  Laterality: N/A;   COLONOSCOPY N/A 01/04/2014   Procedure: COLONOSCOPY;  Surgeon: Winfield Cunas., MD;  Location:  [facility] ENDOSCOPY;  Service: Endoscopy;  Laterality: N/A;   CRANIOTOMY     DILATION AND CURETTAGE OF UTERUS  1967   ESOPHAGOGASTRODUODENOSCOPY N/A 01/05/2013   Procedure: ESOPHAGOGASTRODUODENOSCOPY (EGD);  Surgeon: Cleotis Nipper, MD;  Location: [facility] ENDOSCOPY;  Service: Endoscopy;  Laterality: N/A;   ESOPHAGOGASTRODUODENOSCOPY N/A 06/26/2014   Procedure: ESOPHAGOGASTRODUODENOSCOPY (EGD);  Surgeon: Winfield Cunas., MD;  Location: [facility] ENDOSCOPY;  Service: Endoscopy;  Laterality: N/A;   EYE SURGERY     2003 -RIGHT CATARACT EXTRACTED AND LEFT WAS DONE IN 2004   FLEXIBLE SIGMOIDOSCOPY N/A 06/26/2014   Procedure: FLEXIBLE SIGMOIDOSCOPY;  Surgeon:  Winfield Cunas., MD;  Location: [facility] ENDOSCOPY;  Service: Endoscopy;  Laterality: N/A;   FRONTALIS SUSPENSION  10-09-2010   lifting eyelids AND SECOND EYE SURGERY November 19, 2010   meningioma resected  20O2   PARTIAL COLECTOMY  2008   subglottal mucocoel  2000   THYROIDECTOMY  1986   TONSILLECTOMY  1938   TOTAL HIP ARTHROPLASTY  OCT 2006   right   TOTAL HIP ARTHROPLASTY  07/27/2011   Procedure: TOTAL HIP ARTHROPLASTY;  Surgeon: Gearlean Alf, MD;  Location: [facility] ORS;  Service: Orthopedics;  Laterality: Left;   VESICOVAGINAL FISTULA CLOSURE W/ TAH     WRIST TENDON LESION REMOVED 2007      Social History:  Social History   Socioeconomic History   Marital status: Married    Spouse name: [name]   Number of children: 2   Years of education: Not on file   Highest education level: Not on file  Occupational History   Occupation: Retired    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[place]   Married   2 children, 6 grandchildren   Right handed   Lives with husband   Social Determinants of Health   Financial Resource Strain: Not on file  Food Insecurity: Not on file  Transportation Needs: Not on file  Physical Activity: Not on file  Stress: Not on file  Social Connections: Not on file  Intimate Partner Violence: Not on file    Family History:  Family History  Problem Relation Age of Onset   Heart attack Father        deceased    Medications:   Current Outpatient Medications on File Prior to Visit  Medication Sig Dispense Refill   albuterol (PROVENTIL) (2.5 MG/3ML) 0.083%  nebulizer solution Inhale 3 mLs (2.5 mg total) into the lungs every 4 (four) hours as needed for wheezing or shortness of breath. 75 mL 12   amLODipine (NORVASC) 2.5 MG tablet Take 2.5 mg by mouth daily.     aspirin 81 MG chewable tablet Chew 1 tablet (81 mg total) by mouth daily. 90 tablet 3   clopidogrel (PLAVIX) 75 MG tablet Take 1 tablet (75 mg total) by mouth daily. 21 tablet 0   escitalopram (LEXAPRO) 20 MG tablet Take 20 mg by mouth daily.     ferrous sulfate 325 (65 FE) MG tablet Take 325 mg  by mouth daily with breakfast.     lactobacillus acidophilus (BACID) TABS tablet Take 1 tablet by mouth 2 (two) times daily. For 10 days starting 11-17-21 After 10 days continue taking 1 tablet daily.     levothyroxine (SYNTHROID) 75 MCG tablet Take 1 tablet (75 mcg total) by mouth daily at 6 (six) AM. 90 tablet 3   memantine (NAMENDA) 5 MG tablet Take 5 mg by mouth 2 (two) times daily.     mesalamine (LIALDA) 1.2 g EC tablet Take 1.2 g by mouth daily with breakfast.     predniSONE (DELTASONE) 5 MG tablet Take 5 mg by mouth daily with breakfast.     rosuvastatin (CRESTOR) 20 MG tablet Take 1 tablet (20 mg total) by mouth daily. 90 tablet 3   SYMBICORT 160-4.5 MCG/ACT inhaler Inhale 2 puffs into the lungs 2 (two) times daily.     [DISCONTINUED] Alum & Mag Hydroxide-Simeth (MAGIC MOUTHWASH) SOLN Take 5 mLs by mouth 3 (three) times daily as needed. Thrush      No current facility-administered medications on file prior to visit.    Allergies:   Allergies  Allergen Reactions   Aricept [Donepezil] Other (See Comments)    Not specified on MAR   Sulfamethoxazole Nausea And Vomiting   Tape Other (See Comments)    TAPE TEARS AND BRUISES THE SKIN!!   Biaxin [Clarithromycin] Other (See Comments)    Bitter taste   Ciprofloxacin Rash   Codeine Nausea And Vomiting   Dilantin [Phenytoin] Rash   Doxycycline Nausea Only   Lactose Intolerance (Gi) Other (See Comments)    gas   Restasis [Cyclosporine]  Other (See Comments) and Swelling    Eyes burn    Sulfonamide Derivatives Rash      OBJECTIVE:  Physical Exam  There were no vitals filed for this visit. There is no height or weight on file to calculate BMI. No results found.     04/07/2017    2:34 PM  Depression screen PHQ 2/9  Decreased Interest 0  Down, Depressed, Hopeless 0  PHQ - 2 Score 0  Altered sleeping 0  Tired, decreased energy 3  Change in appetite 0  Feeling bad or failure about yourself  0  Trouble concentrating 0  Moving slowly or fidgety/restless 0  Suicidal thoughts 0  PHQ-9 Score 3     General: well developed, well nourished, seated, in no evident distress Head: head normocephalic and atraumatic.   Neck: supple with no carotid or supraclavicular bruits Cardiovascular: regular rate and rhythm, no murmurs Musculoskeletal: no deformity Skin:  no rash/petichiae Vascular:  Normal pulses all extremities   Neurologic Exam Mental Status: Awake and fully alert. Oriented to place and time. Recent and remote memory intact. Attention span, concentration and fund of knowledge appropriate. Mood and affect appropriate.  Cranial Nerves: Fundoscopic exam reveals sharp disc margins. Pupils equal, briskly reactive to light. Extraocular movements full without nystagmus. Visual fields full to confrontation. Hearing intact. Facial sensation intact. Face, tongue, palate moves normally and symmetrically.  Motor: Normal bulk and tone. Normal strength in all tested extremity muscles Sensory.: intact to touch , pinprick , position and vibratory sensation.  Coordination: Rapid alternating movements normal in all extremities. Finger-to-nose and heel-to-shin performed accurately bilaterally. Gait and Station: Arises from chair without difficulty. Stance is normal. Gait demonstrates normal stride length and balance with ***. Tandem walk and heel toe ***.  Reflexes: 1+ and symmetric. Toes downgoing.     NIHSS  *** Modified  [place]  ***  ASSESSMENT: Sierra [name] is a 88 y.o. year old female with left PLIC infarct with capsular warning syndrome on 12/13/2021 likely secondary to small vessel disease. Vascular risk factors include HTN, HLD, advanced age, dementia and history of craniotomy d/t ? meningioma.      PLAN:  L PLIC  stroke :  Residual deficit: ***.  Continue aspirin '81mg'$  daily and rosuvastatin (Crestor) for secondary stroke prevention.   Discussed secondary stroke prevention measures and importance of close PCP follow up for aggressive stroke risk factor management including BP goal<130/90, HLD with LDL goal<70 and DM with A1c.<7 .  Stroke labs 12/2021: LDL 112, A1c 6.4 I have gone over the pathophysiology of stroke, warning signs and symptoms, risk factors and their management in some detail with instructions to go to the closest emergency room for symptoms of concern.     Follow up in *** or call earlier if needed   CC:  [facility] provider: Dr. Leonie Man PCP: Javier Glazier, MD    I spent *** minutes of face-to-face and non-face-to-face time with patient.  This included previsit chart review including review of recent hospitalization, lab review, study review, order entry, electronic health record documentation, patient education regarding recent stroke including etiology, secondary stroke prevention measures and importance of managing stroke risk factors, residual deficits and typical recovery time and answered all other questions to patient satisfaction   Frann Rider, AGNP-BC  [facility] [address] [place], [place] [address]  Phone [phone] Fax [phone] Note: This document was prepared with digital dictation and possible smart phrase technology. Any transcriptional errors that result from this process are unintentional.

## 2022-02-04 ENCOUNTER — Ambulatory Visit (INDEPENDENT_AMBULATORY_CARE_PROVIDER_SITE_OTHER): Payer: Medicare Other | Admitting: Adult Health

## 2022-02-04 ENCOUNTER — Encounter: Payer: Self-pay | Admitting: Adult Health

## 2022-02-04 VITALS — BP 133/66 | HR 89

## 2022-02-04 DIAGNOSIS — I63532 Cerebral infarction due to unspecified occlusion or stenosis of left posterior cerebral artery: Secondary | ICD-10-CM | POA: Diagnosis not present

## 2022-02-04 DIAGNOSIS — Z09 Encounter for follow-up examination after completed treatment for conditions other than malignant neoplasm: Secondary | ICD-10-CM | POA: Diagnosis not present

## 2022-02-04 MED ORDER — ROSUVASTATIN CALCIUM 20 MG PO TABS: 20.0000 mg | ORAL_TABLET | Freq: Every day | ORAL | 0 refills | Status: AC

## 2022-02-04 NOTE — Patient Instructions (Signed)
Unsure if working with therapies - please ensure working with therapies PT/OT/SLP if not  Continue aspirin 81 mg daily  and restart Crestor '20mg'$  daily  for secondary stroke prevention  Request repeat lipid panel at SNF in 2-3 months  Continue to follow up with PCP regarding cholesterol, pre-DM and blood pressure management  Maintain strict control of hypertension with blood pressure goal below 130/90, diabetes with hemoglobin A1c goal below 7.0 % and cholesterol with LDL cholesterol (bad cholesterol) goal below 70 mg/dL.   Signs of a Stroke? Follow the BEFAST method:  Balance Watch for a sudden loss of balance, trouble with coordination or vertigo Eyes Is there a sudden loss of vision in one or both eyes? Or double vision?  Face: Ask the person to smile. Does one side of the face droop or is it numb?  Arms: Ask the person to raise both arms. Does one arm drift downward? Is there weakness or numbness of a leg? Speech: Ask the person to repeat a simple phrase. Does the speech sound slurred/strange? Is the person confused ? Time: If you observe any of these signs, call 911.    Limited benefit with routine visits when unaccompanied as patient poor historian. Can follow up as needed at this time.       Thank you for coming to see [place] at [facility]. I hope we have been able to provide you high quality care today.  You may receive a patient satisfaction survey over the next few weeks. We would appreciate your feedback and comments so that we may continue to improve ourselves and the health of our patients.

## 2022-02-09 NOTE — Progress Notes (Signed)
I agree with the above plan 

## 2022-04-09 ENCOUNTER — Emergency Department (HOSPITAL_COMMUNITY): Payer: Medicare Other

## 2022-04-09 ENCOUNTER — Encounter (HOSPITAL_COMMUNITY): Payer: Self-pay

## 2022-04-09 ENCOUNTER — Inpatient Hospital Stay (HOSPITAL_COMMUNITY)
Admission: EM | Admit: 2022-04-09 | Discharge: 2022-04-14 | DRG: 377 | Disposition: A | Discharge status: Skilled Nursing Facility | Payer: Medicare Other | Source: Skilled Nursing Facility | Attending: Internal Medicine | Admitting: Internal Medicine

## 2022-04-09 DIAGNOSIS — F0284 Dementia in other diseases classified elsewhere, unspecified severity, with anxiety: Secondary | ICD-10-CM | POA: Diagnosis present

## 2022-04-09 DIAGNOSIS — Z7982 Long term (current) use of aspirin: Secondary | ICD-10-CM

## 2022-04-09 DIAGNOSIS — Z8659 Personal history of other mental and behavioral disorders: Secondary | ICD-10-CM

## 2022-04-09 DIAGNOSIS — Z8673 Personal history of transient ischemic attack (TIA), and cerebral infarction without residual deficits: Secondary | ICD-10-CM

## 2022-04-09 DIAGNOSIS — K921 Melena: Secondary | ICD-10-CM | POA: Diagnosis not present

## 2022-04-09 DIAGNOSIS — Z96643 Presence of artificial hip joint, bilateral: Secondary | ICD-10-CM | POA: Diagnosis present

## 2022-04-09 DIAGNOSIS — G309 Alzheimer's disease, unspecified: Secondary | ICD-10-CM | POA: Diagnosis present

## 2022-04-09 DIAGNOSIS — D62 Acute posthemorrhagic anemia: Secondary | ICD-10-CM | POA: Diagnosis present

## 2022-04-09 DIAGNOSIS — K922 Gastrointestinal hemorrhage, unspecified: Secondary | ICD-10-CM | POA: Diagnosis not present

## 2022-04-09 DIAGNOSIS — F0282 Dementia in other diseases classified elsewhere, unspecified severity, with psychotic disturbance: Secondary | ICD-10-CM | POA: Diagnosis present

## 2022-04-09 DIAGNOSIS — F32A Depression, unspecified: Secondary | ICD-10-CM | POA: Diagnosis present

## 2022-04-09 DIAGNOSIS — I10 Essential (primary) hypertension: Secondary | ICD-10-CM | POA: Diagnosis present

## 2022-04-09 DIAGNOSIS — I472 Ventricular tachycardia, unspecified: Secondary | ICD-10-CM | POA: Diagnosis not present

## 2022-04-09 DIAGNOSIS — R4182 Altered mental status, unspecified: Principal | ICD-10-CM

## 2022-04-09 DIAGNOSIS — Z9049 Acquired absence of other specified parts of digestive tract: Secondary | ICD-10-CM

## 2022-04-09 DIAGNOSIS — E739 Lactose intolerance, unspecified: Secondary | ICD-10-CM | POA: Diagnosis present

## 2022-04-09 DIAGNOSIS — D649 Anemia, unspecified: Secondary | ICD-10-CM

## 2022-04-09 DIAGNOSIS — Z8249 Family history of ischemic heart disease and other diseases of the circulatory system: Secondary | ICD-10-CM

## 2022-04-09 DIAGNOSIS — F0283 Dementia in other diseases classified elsewhere, unspecified severity, with mood disturbance: Secondary | ICD-10-CM | POA: Diagnosis present

## 2022-04-09 DIAGNOSIS — E2740 Unspecified adrenocortical insufficiency: Secondary | ICD-10-CM | POA: Diagnosis present

## 2022-04-09 DIAGNOSIS — Z79899 Other long term (current) drug therapy: Secondary | ICD-10-CM

## 2022-04-09 DIAGNOSIS — Z781 Physical restraint status: Secondary | ICD-10-CM

## 2022-04-09 DIAGNOSIS — Z90710 Acquired absence of both cervix and uterus: Secondary | ICD-10-CM

## 2022-04-09 DIAGNOSIS — K219 Gastro-esophageal reflux disease without esophagitis: Secondary | ICD-10-CM | POA: Diagnosis present

## 2022-04-09 DIAGNOSIS — U071 COVID-19: Secondary | ICD-10-CM | POA: Diagnosis present

## 2022-04-09 DIAGNOSIS — Z7952 Long term (current) use of systemic steroids: Secondary | ICD-10-CM

## 2022-04-09 DIAGNOSIS — E785 Hyperlipidemia, unspecified: Secondary | ICD-10-CM | POA: Diagnosis present

## 2022-04-09 DIAGNOSIS — E890 Postprocedural hypothyroidism: Secondary | ICD-10-CM | POA: Diagnosis present

## 2022-04-09 DIAGNOSIS — Z66 Do not resuscitate: Secondary | ICD-10-CM | POA: Diagnosis present

## 2022-04-09 DIAGNOSIS — G9341 Metabolic encephalopathy: Secondary | ICD-10-CM | POA: Diagnosis present

## 2022-04-09 LAB — CBC WITH DIFFERENTIAL/PLATELET
Abs Immature Granulocytes: 0.23 10*3/uL — ABNORMAL HIGH (ref 0.00–0.07)
Basophils Absolute: 0 10*3/uL (ref 0.0–0.1)
Basophils Relative: 0 %
Eosinophils Absolute: 0 10*3/uL (ref 0.0–0.5)
Eosinophils Relative: 0 %
HCT: 25.6 % — ABNORMAL LOW (ref 36.0–46.0)
Hemoglobin: 7.6 g/dL — ABNORMAL LOW (ref 12.0–15.0)
Immature Granulocytes: 2 %
Lymphocytes Relative: 10 %
Lymphs Abs: 1.2 10*3/uL (ref 0.7–4.0)
MCH: 26.6 pg (ref 26.0–34.0)
MCHC: 29.7 g/dL — ABNORMAL LOW (ref 30.0–36.0)
MCV: 89.5 fL (ref 80.0–100.0)
Monocytes Absolute: 0.9 10*3/uL (ref 0.1–1.0)
Monocytes Relative: 7 %
Neutro Abs: 10.4 10*3/uL — ABNORMAL HIGH (ref 1.7–7.7)
Neutrophils Relative %: 81 %
Platelets: 322 10*3/uL (ref 150–400)
RBC: 2.86 MIL/uL — ABNORMAL LOW (ref 3.87–5.11)
RDW: 15.7 % — ABNORMAL HIGH (ref 11.5–15.5)
WBC: 12.8 10*3/uL — ABNORMAL HIGH (ref 4.0–10.5)
nRBC: 0 % (ref 0.0–0.2)

## 2022-04-09 LAB — URINALYSIS, ROUTINE W REFLEX MICROSCOPIC
Bacteria, UA: NONE SEEN
Bilirubin Urine: NEGATIVE
Glucose, UA: NEGATIVE mg/dL
Hgb urine dipstick: NEGATIVE
Ketones, ur: NEGATIVE mg/dL
Nitrite: NEGATIVE
Protein, ur: NEGATIVE mg/dL
Specific Gravity, Urine: 1.014 (ref 1.005–1.030)
pH: 5 (ref 5.0–8.0)

## 2022-04-09 LAB — COMPREHENSIVE METABOLIC PANEL
ALT: 40 U/L (ref 0–44)
AST: 37 U/L (ref 15–41)
Albumin: 3.3 g/dL — ABNORMAL LOW (ref 3.5–5.0)
Alkaline Phosphatase: 36 U/L — ABNORMAL LOW (ref 38–126)
Anion gap: 10 (ref 5–15)
BUN: 17 mg/dL (ref 8–23)
CO2: 21 mmol/L — ABNORMAL LOW (ref 22–32)
Calcium: 9.1 mg/dL (ref 8.9–10.3)
Chloride: 103 mmol/L (ref 98–111)
Creatinine, Ser: 0.95 mg/dL (ref 0.44–1.00)
GFR, Estimated: 57 mL/min — ABNORMAL LOW (ref >60)
Glucose, Bld: 187 mg/dL — ABNORMAL HIGH (ref 70–99)
Potassium: 3.8 mmol/L (ref 3.5–5.1)
Sodium: 134 mmol/L — ABNORMAL LOW (ref 135–145)
Total Bilirubin: <0.1 mg/dL — ABNORMAL LOW (ref 0.3–1.2)
Total Protein: 6.9 g/dL (ref 6.5–8.1)

## 2022-04-09 LAB — POC OCCULT BLOOD, ED: Fecal Occult Bld: POSITIVE — AB

## 2022-04-09 MED ORDER — PANTOPRAZOLE INFUSION (NEW) - SIMPLE MED
8.0000 mg/h | INTRAVENOUS | Status: AC
  Administered 2022-04-10 – 2022-04-12 (×6): 8 mg/h via INTRAVENOUS
  Filled 2022-04-09 (×10): qty 100

## 2022-04-09 MED ORDER — PANTOPRAZOLE 80MG IVPB - SIMPLE MED
80.0000 mg | Freq: Once | INTRAVENOUS | Status: AC
  Administered 2022-04-10: 80 mg via INTRAVENOUS
  Filled 2022-04-09: qty 100

## 2022-04-09 NOTE — ED Triage Notes (Addendum)
Per staff increased confusion symptoms starting '@0700'$  11/30. Pt has PMH of Alzhemers Dementia and recent dx of COVID (unknown date positive but 2 days until clear)  and uti with 2 days of Macrobid left. Pt is alert and oriented to person at baseline, denies pain or trauma.

## 2022-04-09 NOTE — ED Provider Notes (Signed)
[facility] EMERGENCY DEPARTMENT Provider Note   CSN: [id number] Arrival date & time: 04/09/22  2036    History  Chief Complaint  Patient presents with   Altered Mental Status    LORITA FORINASH is a 88 y.o. female hx of Alzheimer's,  UC, HTN for evaluation of altered mental status. Staff noted when patient awoke at 7 AM. Was at her baseline when she went to bed. Recently dx with COVID 1 week ago, UTI 3 days ago. Has completed 3 days of Macrobid per staff. Patient denies any complaints aside from a cough. States occasionally her mouth gets dry. Denies recent falls.  Admitted 12/13/21 for CVA. Admitted 11/01/21 for GI bleed. No GI WU during hospitalization   Lives at [facility] [place], [place] care unit>> Per nursing staff at facility   Typically alert to self, place however not time.  No prior behavioral issues at baseline.  When staff woke her up this morning she was hallucinating "talking out of her head", yelling out.  This is not typical of her.  She seemed weak and confused. Dark stool in pull up at facility. No BRBPR.  Attempted to call daughter Vesta Mixer at [phone] and son Daphine Deutscher at [phone] no answer    HPI     Home Medications Prior to Admission medications   Medication Sig Start Date End Date Taking? Authorizing Provider  albuterol (PROVENTIL) (2.5 MG/3ML) 0.083% nebulizer solution Inhale 3 mLs (2.5 mg total) into the lungs every 4 (four) hours as needed for wheezing or shortness of breath. 12/16/21   Nooruddin, Marlene Lard, MD  amLODipine (NORVASC) 2.5 MG tablet Take 2.5 mg by mouth daily.    [provider]  aspirin EC 81 MG tablet Take 81 mg by mouth daily. Swallow whole.    [provider]  budesonide (PULMICORT) 0.25 MG/2ML nebulizer solution Take 0.25 mg by nebulization 2 (two) times daily.    [provider]  escitalopram (LEXAPRO) 20 MG tablet Take 20 mg by mouth daily.    [provider]   ferrous sulfate 325 (65 FE) MG tablet Take 325 mg by mouth daily with breakfast.    [provider]  levothyroxine (SYNTHROID) 75 MCG tablet Take 1 tablet (75 mcg total) by mouth daily at 6 (six) AM. 12/17/21   Nooruddin, Marlene Lard, MD  memantine (NAMENDA) 5 MG tablet Take 5 mg by mouth 2 (two) times daily.    [provider]  mesalamine (LIALDA) 1.2 g EC tablet Take by mouth daily with breakfast.    [provider]  predniSONE (DELTASONE) 5 MG tablet Take 5 mg by mouth daily with breakfast.    [provider]  Probiotic Product (PROBIOTIC-10 PO) Take by mouth.    [provider]  rosuvastatin (CRESTOR) 20 MG tablet Take 1 tablet (20 mg total) by mouth daily. 02/04/22   Frann Rider, NP  Alum & Mag Hydroxide-Simeth (MAGIC MOUTHWASH) SOLN Take 5 mLs by mouth 3 (three) times daily as needed. Thrush   07/21/11  [provider]      Allergies    Aricept [donepezil], Sulfamethoxazole, Tape, Biaxin [clarithromycin], Ciprofloxacin, Codeine, Dilantin [phenytoin], Doxycycline, Lactose intolerance (gi), Restasis [cyclosporine], and Sulfonamide derivatives    Review of Systems   Review of Systems  Unable to perform ROS: Dementia    Physical Exam Updated Vital Signs BP 127/63 (BP Location: Right Arm)   Pulse 81   Temp 97.8 F (36.6 C) (Oral)   Resp (!) 21  Ht '5\' 1"'$  (1.549 m)   Wt 51.3 kg   SpO2 98%   BMI 21.35 kg/m  Physical Exam Vitals and nursing note reviewed. Exam conducted with a chaperone present.  Constitutional:      General: She is not in acute distress.    Appearance: She is well-developed. She is ill-appearing. She is not toxic-appearing or diaphoretic.  HENT:     Head: Normocephalic and atraumatic.     Nose: Nose normal.     Mouth/Throat:     Mouth: Mucous membranes are moist.  Eyes:     Pupils: Pupils are equal, round, and reactive to light.  Cardiovascular:     Rate and Rhythm: Normal rate.     Pulses: Normal pulses.      Heart sounds: Normal heart sounds.  Pulmonary:     Effort: Pulmonary effort is normal. No respiratory distress.     Breath sounds: Rhonchi present.     Comments: Mild rhonchi to lower lobes, speaks without difficulty Abdominal:     General: There is no distension.     Tenderness: There is no abdominal tenderness. There is no right CVA tenderness, left CVA tenderness, guarding or rebound.  Genitourinary:    Comments: Tech present in room for exam. Melenotic stool in rectal vault. Musculoskeletal:        General: Normal range of motion.     Cervical back: Normal range of motion.     Comments: Old Steri-Strip left anterior shin.  No surrounding erythema or warmth.  Lift bilateral legs off bed without difficulty  Skin:    General: Skin is warm and dry.     Capillary Refill: Capillary refill takes less than 2 seconds.     Comments: Old incision to left anterior shin, no overlying skin changes  Neurological:     Mental Status: She is alert.     Comments: Pleasantly confused. Alert to person however not place, time. Talking to husband in room that is not there  Psychiatric:        Mood and Affect: Mood normal.    ED Results / Procedures / Treatments   Labs (all labs ordered are listed, but only abnormal results are displayed) Labs Reviewed  CBC WITH DIFFERENTIAL/PLATELET - Abnormal; Notable for the following components:      Result Value   WBC 12.8 (*)    RBC 2.86 (*)    Hemoglobin 7.6 (*)    HCT 25.6 (*)    MCHC 29.7 (*)    RDW 15.7 (*)    Neutro Abs 10.4 (*)    Abs Immature Granulocytes 0.23 (*)    All other components within normal limits  COMPREHENSIVE METABOLIC PANEL - Abnormal; Notable for the following components:   Sodium 134 (*)    CO2 21 (*)    Glucose, Bld 187 (*)    Albumin 3.3 (*)    Alkaline Phosphatase 36 (*)    Total Bilirubin <0.1 (*)    GFR, Estimated 57 (*)    All other components within normal limits  URINALYSIS, ROUTINE W REFLEX MICROSCOPIC - Abnormal;  Notable for the following components:   Leukocytes,Ua TRACE (*)    All other components within normal limits  POC OCCULT BLOOD, ED - Abnormal; Notable for the following components:   Fecal Occult Bld POSITIVE (*)    All other components within normal limits  URINE CULTURE  RESP PANEL BY RT-PCR (FLU A&B, COVID) [id number]  PROTIME-INR  TYPE AND SCREEN    EKG  EKG Interpretation  Date/Time:  Thursday April 09 2022 21:54:27 EST Ventricular Rate:  77 PR Interval:  172 QRS Duration: 139 QT Interval:  432 QTC Calculation: 489 R Axis:   -25 Text Interpretation: Sinus rhythm Right bundle branch block Inferior infarct, old No significant change since last tracing Confirmed by Fredia Sorrow [phone]) on 04/09/2022 11:22:06 PM  Radiology DG Chest 2 View  Result Date: 04/09/2022 CLINICAL DATA:  Altered level of consciousness, COVID EXAM: CHEST - 2 VIEW COMPARISON:  12/14/2021 FINDINGS: Frontal and lateral views of the chest demonstrates stable enlargement of the cardiac silhouette. No acute airspace disease, effusion, or pneumothorax. No acute bony abnormalities. IMPRESSION: 1. No acute intrathoracic process. Electronically Signed   By: Randa Ngo M.D.   On: 04/09/2022 21:55    Procedures Procedures    Medications Ordered in ED Medications  pantoprazole (PROTONIX) 80 mg /NS 100 mL IVPB (has no administration in time range)  pantoprozole (PROTONIX) 80 mg /NS 100 mL infusion (has no administration in time range)   ED Course/ Medical Decision Making/ A&P Clinical Course as of 04/09/22 2340  Thu Apr 09, 2022  2245 CBC with Differential(!) Baseline appears to be around 12, will get occult stool [BH]    Clinical Course User Index [BH] Vedansh Kerstetter A, PA-C    88 year old history of dementia, UC, diagnosed UTI, COVID here for evaluation of weakness, confusion and altered mental status noted by facility this morning.  Patient normally up, about, alert to person, place.  Stability  states patient has had increased weakness, confusion, hallucinating" talking out of her head."  On my initial evaluation patient complains of an intermittent dry mouth and cough however no other complaints.  Facility noted dark stool in pull-up.  Has history of prior GI bleeds.  Arrives with stable vital signs.  Appears pleasantly confused. Intermittent talking to husband in room who is not present.  Labs and imaging personally viewed and interpreted:  CBC leukocytosis 12.8, hemoglobin 7.6, appears 12 is her baseline Occult positive Chest xray without infiltrates, cardiomegaly, pulm edema CMP sodium 134, glucose 187, BUN 17 UA neg for UTI  Given hemoglobin drop from 12 at baseline to 7, weakness with gross melena on exam.  I messaged Dr. Henrene Pastor with [facility] who is on-call.  Also added to treatment team.  I suspect this is likely an upper GI bleed given melena.  She was started on Protonix. Will hold on transfusion at this time.  Care transferred to oncoming provider who will follow-up on remaining labs and imaging.  Suspect patient will need to be admitted for weakness, confusion as well as GI bleed.                            Medical Decision Making Amount and/or Complexity of Data Reviewed Independent Historian: EMS    Details: EMS< facility External Data Reviewed: labs, radiology, ECG and notes. Labs: ordered. Decision-making details documented in ED Course. Radiology: ordered and independent interpretation performed. Decision-making details documented in ED Course. ECG/medicine tests: ordered and independent interpretation performed. Decision-making details documented in ED Course.  Risk OTC drugs. Prescription drug management. Parenteral controlled substances. Decision regarding hospitalization. Diagnosis or treatment significantly limited by social determinants of health.          Final Clinical Impression(s) / ED Diagnoses Final diagnoses:  Altered mental status,  unspecified altered mental status type  History of dementia  Gastrointestinal hemorrhage with melena  Anemia, unspecified  type  COVID    Rx / DC Orders ED Discharge Orders     None         Telecia Larocque A, PA-C 04/10/22 0002    Fredia Sorrow, MD 04/11/22 0002

## 2022-04-10 DIAGNOSIS — E739 Lactose intolerance, unspecified: Secondary | ICD-10-CM | POA: Diagnosis present

## 2022-04-10 DIAGNOSIS — Z79899 Other long term (current) drug therapy: Secondary | ICD-10-CM | POA: Diagnosis not present

## 2022-04-10 DIAGNOSIS — K922 Gastrointestinal hemorrhage, unspecified: Secondary | ICD-10-CM

## 2022-04-10 DIAGNOSIS — Z7952 Long term (current) use of systemic steroids: Secondary | ICD-10-CM | POA: Diagnosis not present

## 2022-04-10 DIAGNOSIS — Z90710 Acquired absence of both cervix and uterus: Secondary | ICD-10-CM | POA: Diagnosis not present

## 2022-04-10 DIAGNOSIS — R4182 Altered mental status, unspecified: Secondary | ICD-10-CM | POA: Diagnosis not present

## 2022-04-10 DIAGNOSIS — E2740 Unspecified adrenocortical insufficiency: Secondary | ICD-10-CM | POA: Diagnosis present

## 2022-04-10 DIAGNOSIS — D649 Anemia, unspecified: Secondary | ICD-10-CM | POA: Diagnosis not present

## 2022-04-10 DIAGNOSIS — K921 Melena: Secondary | ICD-10-CM

## 2022-04-10 DIAGNOSIS — F32A Depression, unspecified: Secondary | ICD-10-CM | POA: Diagnosis present

## 2022-04-10 DIAGNOSIS — Z7982 Long term (current) use of aspirin: Secondary | ICD-10-CM | POA: Diagnosis not present

## 2022-04-10 DIAGNOSIS — F0283 Dementia in other diseases classified elsewhere, unspecified severity, with mood disturbance: Secondary | ICD-10-CM | POA: Diagnosis present

## 2022-04-10 DIAGNOSIS — I472 Ventricular tachycardia, unspecified: Secondary | ICD-10-CM | POA: Diagnosis not present

## 2022-04-10 DIAGNOSIS — I10 Essential (primary) hypertension: Secondary | ICD-10-CM | POA: Diagnosis present

## 2022-04-10 DIAGNOSIS — U071 COVID-19: Secondary | ICD-10-CM | POA: Diagnosis present

## 2022-04-10 DIAGNOSIS — K219 Gastro-esophageal reflux disease without esophagitis: Secondary | ICD-10-CM | POA: Diagnosis present

## 2022-04-10 DIAGNOSIS — F0282 Dementia in other diseases classified elsewhere, unspecified severity, with psychotic disturbance: Secondary | ICD-10-CM | POA: Diagnosis present

## 2022-04-10 DIAGNOSIS — F03918 Unspecified dementia, unspecified severity, with other behavioral disturbance: Secondary | ICD-10-CM | POA: Diagnosis not present

## 2022-04-10 DIAGNOSIS — D62 Acute posthemorrhagic anemia: Secondary | ICD-10-CM | POA: Diagnosis present

## 2022-04-10 DIAGNOSIS — G9341 Metabolic encephalopathy: Secondary | ICD-10-CM | POA: Diagnosis present

## 2022-04-10 DIAGNOSIS — Z8673 Personal history of transient ischemic attack (TIA), and cerebral infarction without residual deficits: Secondary | ICD-10-CM | POA: Diagnosis not present

## 2022-04-10 DIAGNOSIS — Z96643 Presence of artificial hip joint, bilateral: Secondary | ICD-10-CM | POA: Diagnosis present

## 2022-04-10 DIAGNOSIS — F0284 Dementia in other diseases classified elsewhere, unspecified severity, with anxiety: Secondary | ICD-10-CM | POA: Diagnosis present

## 2022-04-10 DIAGNOSIS — E890 Postprocedural hypothyroidism: Secondary | ICD-10-CM | POA: Diagnosis present

## 2022-04-10 DIAGNOSIS — G309 Alzheimer's disease, unspecified: Secondary | ICD-10-CM | POA: Diagnosis present

## 2022-04-10 DIAGNOSIS — Z8249 Family history of ischemic heart disease and other diseases of the circulatory system: Secondary | ICD-10-CM | POA: Diagnosis not present

## 2022-04-10 DIAGNOSIS — Z66 Do not resuscitate: Secondary | ICD-10-CM | POA: Diagnosis present

## 2022-04-10 DIAGNOSIS — E785 Hyperlipidemia, unspecified: Secondary | ICD-10-CM | POA: Diagnosis present

## 2022-04-10 LAB — CBC
HCT: 31.6 % — ABNORMAL LOW (ref 36.0–46.0)
Hemoglobin: 10.3 g/dL — ABNORMAL LOW (ref 12.0–15.0)
MCH: 27.9 pg (ref 26.0–34.0)
MCHC: 32.6 g/dL (ref 30.0–36.0)
MCV: 85.6 fL (ref 80.0–100.0)
Platelets: 363 10*3/uL (ref 150–400)
RBC: 3.69 MIL/uL — ABNORMAL LOW (ref 3.87–5.11)
RDW: 15.6 % — ABNORMAL HIGH (ref 11.5–15.5)
WBC: 14.5 10*3/uL — ABNORMAL HIGH (ref 4.0–10.5)
nRBC: 0 % (ref 0.0–0.2)

## 2022-04-10 LAB — COMPREHENSIVE METABOLIC PANEL
ALT: 49 U/L — ABNORMAL HIGH (ref 0–44)
AST: 43 U/L — ABNORMAL HIGH (ref 15–41)
Albumin: 3.2 g/dL — ABNORMAL LOW (ref 3.5–5.0)
Alkaline Phosphatase: 37 U/L — ABNORMAL LOW (ref 38–126)
Anion gap: 6 (ref 5–15)
BUN: 12 mg/dL (ref 8–23)
CO2: 24 mmol/L (ref 22–32)
Calcium: 8.7 mg/dL — ABNORMAL LOW (ref 8.9–10.3)
Chloride: 107 mmol/L (ref 98–111)
Creatinine, Ser: 0.74 mg/dL (ref 0.44–1.00)
GFR, Estimated: >60 mL/min (ref >60)
Glucose, Bld: 131 mg/dL — ABNORMAL HIGH (ref 70–99)
Potassium: 3.9 mmol/L (ref 3.5–5.1)
Sodium: 137 mmol/L (ref 135–145)
Total Bilirubin: 0.7 mg/dL (ref 0.3–1.2)
Total Protein: 6.8 g/dL (ref 6.5–8.1)

## 2022-04-10 LAB — PROTIME-INR
INR: 1.1 (ref 0.8–1.2)
Prothrombin Time: 14.5 seconds (ref 11.4–15.2)

## 2022-04-10 LAB — HEMOGLOBIN AND HEMATOCRIT, BLOOD
HCT: 20.7 % — ABNORMAL LOW (ref 36.0–46.0)
Hemoglobin: 6.1 g/dL — CL (ref 12.0–15.0)

## 2022-04-10 LAB — RESP PANEL BY RT-PCR (FLU A&B, COVID) ARPGX2
Influenza A by PCR: NEGATIVE
Influenza B by PCR: NEGATIVE
SARS Coronavirus 2 by RT PCR: POSITIVE — AB

## 2022-04-10 LAB — PREPARE RBC (CROSSMATCH)

## 2022-04-10 MED ORDER — LORAZEPAM 0.5 MG PO TABS
0.5000 mg | ORAL_TABLET | Freq: Two times a day (BID) | ORAL | Status: DC
  Administered 2022-04-10 – 2022-04-14 (×9): 0.5 mg via ORAL
  Filled 2022-04-10 (×9): qty 1

## 2022-04-10 MED ORDER — SODIUM CHLORIDE 0.9% IV SOLUTION: Freq: Once | INTRAVENOUS | Status: AC

## 2022-04-10 MED ORDER — ROSUVASTATIN CALCIUM 20 MG PO TABS
20.0000 mg | ORAL_TABLET | Freq: Every day | ORAL | Status: DC
  Administered 2022-04-10 – 2022-04-14 (×5): 20 mg via ORAL
  Filled 2022-04-10 (×5): qty 1

## 2022-04-10 MED ORDER — MEMANTINE HCL 10 MG PO TABS
5.0000 mg | ORAL_TABLET | Freq: Two times a day (BID) | ORAL | Status: DC
  Administered 2022-04-10 – 2022-04-14 (×9): 5 mg via ORAL
  Filled 2022-04-10 (×9): qty 1

## 2022-04-10 MED ORDER — BUDESONIDE 0.25 MG/2ML IN SUSP
0.2500 mg | Freq: Two times a day (BID) | RESPIRATORY_TRACT | Status: DC
  Administered 2022-04-10 – 2022-04-14 (×8): 0.25 mg via RESPIRATORY_TRACT
  Filled 2022-04-10 (×7): qty 2

## 2022-04-10 MED ORDER — PREDNISONE 5 MG PO TABS
5.0000 mg | ORAL_TABLET | Freq: Every day | ORAL | Status: DC
  Administered 2022-04-10 – 2022-04-14 (×5): 5 mg via ORAL
  Filled 2022-04-10 (×5): qty 1

## 2022-04-10 MED ORDER — ESCITALOPRAM OXALATE 20 MG PO TABS
20.0000 mg | ORAL_TABLET | Freq: Every day | ORAL | Status: DC
  Administered 2022-04-10 – 2022-04-14 (×5): 20 mg via ORAL
  Filled 2022-04-10 (×2): qty 1
  Filled 2022-04-10: qty 2
  Filled 2022-04-10 (×2): qty 1

## 2022-04-10 MED ORDER — ACETAMINOPHEN 325 MG PO TABS: 650.0000 mg | ORAL_TABLET | Freq: Four times a day (QID) | ORAL | Status: DC | PRN

## 2022-04-10 MED ORDER — ALBUTEROL SULFATE (2.5 MG/3ML) 0.083% IN NEBU: 2.5000 mg | INHALATION_SOLUTION | RESPIRATORY_TRACT | Status: DC | PRN

## 2022-04-10 MED ORDER — ONDANSETRON HCL 4 MG/2ML IJ SOLN: 4.0000 mg | Freq: Four times a day (QID) | INTRAMUSCULAR | Status: DC | PRN

## 2022-04-10 MED ORDER — MESALAMINE 1.2 G PO TBEC
1.2000 g | DELAYED_RELEASE_TABLET | Freq: Every day | ORAL | Status: DC
  Administered 2022-04-11 – 2022-04-14 (×4): 1.2 g via ORAL
  Filled 2022-04-10 (×5): qty 1

## 2022-04-10 MED ORDER — LEVOTHYROXINE SODIUM 75 MCG PO TABS
75.0000 ug | ORAL_TABLET | Freq: Every day | ORAL | Status: DC
  Administered 2022-04-11 – 2022-04-14 (×4): 75 ug via ORAL
  Filled 2022-04-10 (×4): qty 1

## 2022-04-10 NOTE — Progress Notes (Signed)
1 unit PRBCs ordered to be transfused for hemoglobin of 7.6K with patient's daughter and son consents.  We will repeat CBC post blood transfusion.

## 2022-04-10 NOTE — Consult Note (Signed)
[facility] Gastroenterology Consult  Referring Provider: No ref. provider found Primary Care Physician:  Javier Glazier, MD Primary Gastroenterologist: Dr. Alessandra Bevels [facility])  Reason for Consultation: Melena, anemia  SUBJECTIVE:   HPI: Sierra [name] is a 88 y.o. female with past medical history significant for ulcerative colitis, dementia, hyperlipidemia, hypertension, adrenal insufficiency on prednisone therapy.  History of diverticulitis requiring rectosigmoid resection in the past.  Diagnosed with COVID 1 week prior.  Presented to [facility] on 04/09/2022 from memory care unit for evaluation of altered mental status.  Per documentation patient had dark-colored stool in depends.  On my evaluation, patient not interactive, will intermittently awaken to stated name though unable to obtain review of systems or history.  History therefore obtained by chart review.  She last saw [facility] in office on 08/14/2019, at that time she was stable on Lialda 2.4 g p.o. daily, she was recommended to have 1 year follow-up.  Her last colonoscopy was completed on 01/04/2014 by Dr. Oletta Lamas for rectal bleeding, findings were significant for internal hemorrhoids, left-sided diverticulosis, rectosigmoid anastomosis, no active bleeding.  On presentation, hemoglobin 7.6 (down trended to 6.1, was 12.9 on 12/16/2021), WBC 12.8, platelet 322, sodium 134, potassium 3.8, AST/ALT 37/40, ALP 36, total bilirubin less than 0.1, INR 1.1, COVID positive, fecal occult blood test positive.  No imaging obtained.  Past Medical History:  Diagnosis Date   Acute asthmatic bronchitis    Acute respiratory failure with hypoxia (HCC) 06/11/2016   Allergic rhinitis    Arthritis    Hx R shoulder bursitis, pain L knee and L hip   Asthmatic bronchitis    ATYPICAL MYCOBACTERIAL INFECTION 05/05/2010   Sputum cx Pos 12/ 2011     Balance problem    SINCE BRAIN TUMOR REMOVED IN 2002-BENIGN TUMOR-PT HAS ADRENAL INSUFFICIENCY AND TAKES  DAILY PREDNISONE   Blood transfusion    Cardiac murmur    DOES NOT CAUSE ANY SYMPTOMS   Chronic kidney disease    Chronic pharyngitis    Colonic diverticular abscess 02/01/2017   Diverticulitis, colon    Dizziness    DOE (dyspnea on exertion)    Esophageal reflux    GI bleed 01/06/2013   Hyperlipemia    Hypertension    Hypothyroidism    IBS (irritable bowel syndrome)    Leukocytosis    LGI bleed 01/05/2013   Other specified iron deficiency anemias    PNEUMONIA 06/02/2010   Qualifier: Diagnosis of  By: Annamaria Boots MD, Clinton D    Postop Hyponatremia 07/28/2011   Rectal bleeding 01/03/2014   Recurrent upper respiratory infection (URI) 07/10/2011    ACUTE BRONCHITIS - extra prednisone in addition to the daily prednisone and a Z-Pak   Thrombocytosis    THRUSH 04/30/2009   Qualifier: Diagnosis of  By: Annamaria Boots MD, Clinton D    Ulceration, colon    Ulcerative colitis    Past Surgical History:  Procedure Laterality Date   ABDOMINAL HYSTERECTOMY  1985   APPENDECTOMY     COLONOSCOPY N/A 01/05/2013   Procedure: COLONOSCOPY;  Surgeon: Cleotis Nipper, MD;  Location: [facility] ENDOSCOPY;  Service: Endoscopy;  Laterality: N/A;   COLONOSCOPY N/A 01/04/2014   Procedure: COLONOSCOPY;  Surgeon: Winfield Cunas., MD;  Location: [facility] ENDOSCOPY;  Service: Endoscopy;  Laterality: N/A;   CRANIOTOMY     DILATION AND CURETTAGE OF UTERUS  1967   ESOPHAGOGASTRODUODENOSCOPY N/A 01/05/2013   Procedure: ESOPHAGOGASTRODUODENOSCOPY (EGD);  Surgeon: Cleotis Nipper, MD;  Location: [facility] ENDOSCOPY;  Service: Endoscopy;  Laterality: N/A;   ESOPHAGOGASTRODUODENOSCOPY N/A 06/26/2014   Procedure: ESOPHAGOGASTRODUODENOSCOPY (EGD);  Surgeon: Winfield Cunas., MD;  Location: [facility] ENDOSCOPY;  Service: Endoscopy;  Laterality: N/A;   EYE SURGERY     2003 -RIGHT CATARACT EXTRACTED AND LEFT WAS DONE IN 2004   FLEXIBLE SIGMOIDOSCOPY N/A 06/26/2014   Procedure: FLEXIBLE SIGMOIDOSCOPY;  Surgeon: Winfield Cunas., MD;  Location: [facility]  ENDOSCOPY;  Service: Endoscopy;  Laterality: N/A;   FRONTALIS SUSPENSION  10-09-2010   lifting eyelids AND SECOND EYE SURGERY November 19, 2010   meningioma resected  20O2   PARTIAL COLECTOMY  2008   subglottal mucocoel  2000   THYROIDECTOMY  1986   TONSILLECTOMY  1938   TOTAL HIP ARTHROPLASTY  OCT 2006   right   TOTAL HIP ARTHROPLASTY  07/27/2011   Procedure: TOTAL HIP ARTHROPLASTY;  Surgeon: Gearlean Alf, MD;  Location: [facility] ORS;  Service: Orthopedics;  Laterality: Left;   VESICOVAGINAL FISTULA CLOSURE W/ TAH     WRIST TENDON LESION REMOVED 2007     Prior to Admission medications   Medication Sig Start Date End Date Taking? Authorizing Provider  albuterol (PROVENTIL) (2.5 MG/3ML) 0.083% nebulizer solution Inhale 3 mLs (2.5 mg total) into the lungs every 4 (four) hours as needed for wheezing or shortness of breath. 12/16/21  Yes Nooruddin, Marlene Lard, MD  amLODipine (NORVASC) 2.5 MG tablet Take 2.5 mg by mouth daily.   Yes [provider]  ascorbic acid (VITAMIN C) 500 MG tablet Take 500 mg by mouth daily. For 10 days starting 03/31/22   Yes [provider]  aspirin EC 81 MG tablet Take 81 mg by mouth daily. Swallow whole.   Yes [provider]  B Complex Vitamins (VITAMIN B COMPLEX PO) Take 1 tablet by mouth daily.   Yes [provider]  budesonide-formoterol (SYMBICORT) 160-4.5 MCG/ACT inhaler Inhale 2 puffs into the lungs 2 (two) times daily.   Yes [provider]  cholecalciferol (VITAMIN D3) 25 MCG (1000 UNIT) tablet Take 1,000 Units by mouth daily. For 10 days starting 03/31/22   Yes [provider]  escitalopram (LEXAPRO) 20 MG tablet Take 20 mg by mouth daily.   Yes [provider]  ferrous sulfate 325 (65 FE) MG tablet Take 325 mg by mouth daily with breakfast.   Yes [provider]  levothyroxine (SYNTHROID) 75 MCG tablet Take 1 tablet (75 mcg total) by mouth daily at 6 (six) AM. Patient taking differently: Take 75 mcg  by mouth daily. 12/17/21  Yes Nooruddin, Marlene Lard, MD  LORazepam (ATIVAN) 0.5 MG tablet Take 0.5 mg by mouth in the morning and at bedtime.   Yes [provider]  memantine (NAMENDA) 5 MG tablet Take 5 mg by mouth 2 (two) times daily.   Yes [provider]  mesalamine (LIALDA) 1.2 g EC tablet Take 1.2 g by mouth daily with breakfast.   Yes [provider]  nitrofurantoin, macrocrystal-monohydrate, (MACROBID) 100 MG capsule Take 100 mg by mouth 2 (two) times daily. For 7 days starting 04/04/22   Yes [provider]  predniSONE (DELTASONE) 5 MG tablet Take 5 mg by mouth daily.   Yes [provider]  Probiotic Product (PROBIOTIC-10 PO) Take 1 tablet by mouth daily.   Yes [provider]  rosuvastatin (CRESTOR) 20 MG tablet Take 1 tablet (20 mg total) by mouth daily. 02/04/22  Yes McCue, Janett Billow, NP  Sodium Fluoride (SODIUM FLUORIDE 5000 PPM) 1.1 % PSTE Place 1  Application onto teeth daily.   Yes [provider]  Zinc 50 MG TABS Take 50 mg by mouth every other day. For 10 days starting 03/31/22   Yes [provider]  budesonide (PULMICORT) 0.25 MG/2ML nebulizer solution Take 0.25 mg by nebulization 2 (two) times daily. Patient not taking: Reported on 04/10/2022    [provider]  cefTRIAXone (ROCEPHIN) 1 g injection Inject 1 g into the muscle once. Patient not taking: Reported on 04/10/2022    [provider]  nirmatrelvir & ritonavir (PAXLOVID, 150/100,) 10 x 150 MG & 10 x '100MG'$  TBPK Take 1 tablet by mouth in the morning and at bedtime. For 5 days starting 04/02/22 Patient not taking: Reported on 04/10/2022    [provider]  Alum & Mag Hydroxide-Simeth (MAGIC MOUTHWASH) SOLN Take 5 mLs by mouth 3 (three) times daily as needed. Thrush   07/21/11  [provider]   Current Facility-Administered Medications  Medication Dose Route Frequency Provider Last Rate Last Admin   acetaminophen (TYLENOL) tablet  650 mg  650 mg Oral Q6H PRN Ghimire, Henreitta Leber, MD       albuterol (PROVENTIL) (2.5 MG/3ML) 0.083% nebulizer solution 2.5 mg  2.5 mg Inhalation Q4H PRN Ghimire, Henreitta Leber, MD       budesonide (PULMICORT) nebulizer solution 0.25 mg  0.25 mg Nebulization BID Ghimire, Henreitta Leber, MD       escitalopram (LEXAPRO) tablet 20 mg  20 mg Oral Daily Jonetta Osgood, MD   20 mg at 04/10/22 1128   levothyroxine (SYNTHROID) tablet 75 mcg  75 mcg Oral Q0600 Jonetta Osgood, MD       LORazepam (ATIVAN) tablet 0.5 mg  0.5 mg Oral BID Jonetta Osgood, MD   0.5 mg at 04/10/22 1130   memantine (NAMENDA) tablet 5 mg  5 mg Oral BID Jonetta Osgood, MD   5 mg at 04/10/22 1130   [START ON 04/11/2022] mesalamine (LIALDA) EC tablet 1.2 g  1.2 g Oral Q breakfast Ghimire, Henreitta Leber, MD       ondansetron [facility]) injection 4 mg  4 mg Intravenous Q6H PRN Ghimire, Henreitta Leber, MD       pantoprozole (PROTONIX) 80 mg /NS 100 mL infusion  8 mg/hr Intravenous Continuous Henderly, Britni A, PA-C 10 mL/hr at 04/10/22 0113 8 mg/hr at 04/10/22 0113   predniSONE (DELTASONE) tablet 5 mg  5 mg Oral Q breakfast Jonetta Osgood, MD   5 mg at 04/10/22 1129   rosuvastatin (CRESTOR) tablet 20 mg  20 mg Oral Daily Jonetta Osgood, MD   20 mg at 04/10/22 1130   Current Outpatient Medications  Medication Sig Dispense Refill   albuterol (PROVENTIL) (2.5 MG/3ML) 0.083% nebulizer solution Inhale 3 mLs (2.5 mg total) into the lungs every 4 (four) hours as needed for wheezing or shortness of breath. 75 mL 12   amLODipine (NORVASC) 2.5 MG tablet Take 2.5 mg by mouth daily.     ascorbic acid (VITAMIN C) 500 MG tablet Take 500 mg by mouth daily. For 10 days starting 03/31/22     aspirin EC 81 MG tablet Take 81 mg by mouth daily. Swallow whole.     B Complex Vitamins (VITAMIN B COMPLEX PO) Take 1 tablet by mouth daily.     budesonide-formoterol (SYMBICORT) 160-4.5 MCG/ACT inhaler Inhale 2 puffs into the lungs 2 (two) times daily.      cholecalciferol (VITAMIN D3) 25 MCG (1000 UNIT) tablet Take 1,000 Units by mouth  daily. For 10 days starting 03/31/22     escitalopram (LEXAPRO) 20 MG tablet Take 20 mg by mouth daily.     ferrous sulfate 325 (65 FE) MG tablet Take 325 mg by mouth daily with breakfast.     levothyroxine (SYNTHROID) 75 MCG tablet Take 1 tablet (75 mcg total) by mouth daily at 6 (six) AM. (Patient taking differently: Take 75 mcg by mouth daily.) 90 tablet 3   LORazepam (ATIVAN) 0.5 MG tablet Take 0.5 mg by mouth in the morning and at bedtime.     memantine (NAMENDA) 5 MG tablet Take 5 mg by mouth 2 (two) times daily.     mesalamine (LIALDA) 1.2 g EC tablet Take 1.2 g by mouth daily with breakfast.     nitrofurantoin, macrocrystal-monohydrate, (MACROBID) 100 MG capsule Take 100 mg by mouth 2 (two) times daily. For 7 days starting 04/04/22     predniSONE (DELTASONE) 5 MG tablet Take 5 mg by mouth daily.     Probiotic Product (PROBIOTIC-10 PO) Take 1 tablet by mouth daily.     rosuvastatin (CRESTOR) 20 MG tablet Take 1 tablet (20 mg total) by mouth daily. 30 tablet 0   Sodium Fluoride (SODIUM FLUORIDE 5000 PPM) 1.1 % PSTE Place 1 Application onto teeth daily.     Zinc 50 MG TABS Take 50 mg by mouth every other day. For 10 days starting 03/31/22     budesonide (PULMICORT) 0.25 MG/2ML nebulizer solution Take 0.25 mg by nebulization 2 (two) times daily. (Patient not taking: Reported on 04/10/2022)     cefTRIAXone (ROCEPHIN) 1 g injection Inject 1 g into the muscle once. (Patient not taking: Reported on 04/10/2022)     nirmatrelvir & ritonavir (PAXLOVID, 150/100,) 10 x 150 MG & 10 x '100MG'$  TBPK Take 1 tablet by mouth in the morning and at bedtime. For 5 days starting 04/02/22 (Patient not taking: Reported on 04/10/2022)     Allergies as of 04/09/2022 - Review Complete 02/04/2022  Allergen Reaction Noted   Aricept [donepezil] Other (See Comments) 11/20/2021   Sulfamethoxazole Nausea And Vomiting 07/24/2016   Tape Other (See  Comments) 07/23/2016   Biaxin [clarithromycin] Other (See Comments) 06/20/2013   Ciprofloxacin Rash    Codeine Nausea And Vomiting    Dilantin [phenytoin] Rash    Doxycycline Nausea Only 04/28/2011   Lactose intolerance (gi) Other (See Comments) 08/12/2015   Restasis [cyclosporine] Other (See Comments) and Swelling 07/15/2011   Sulfonamide derivatives Rash    Family History  Problem Relation Age of Onset   Heart attack Father        deceased   Social History   Socioeconomic History   Marital status: Married    Spouse name: [name]   Number of children: 2   Years of education: Not on file   Highest education level: Not on file  Occupational History   Occupation: Retired    Fish farm manager: OTHER    Comment: Education officer, museum, Scientist, 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[place]   Married   2 children, 6 grandchildren  Right handed   Lives with husband   Social Determinants of Health   Financial Resource Strain: Not on file  Food Insecurity: Not on file  Transportation Needs: Not on file  Physical Activity: Not on file  Stress: Not on file  Social Connections: Not on file  Intimate Partner Violence: Not on file   Review of Systems:  Review of Systems  Unable to perform ROS: Dementia    OBJECTIVE:   Temp:  [97.3 F (36.3 C)-98 F (36.7 C)] 98 F (36.7 C) (12/01 1330) Pulse Rate:  [65-81] 66 (12/01 1700) Resp:  [17-22] 22 (12/01 1700) BP: (125-156)/(57-89) 140/70 (12/01 1700) SpO2:  [92 %-100 %] 97 % (12/01 1700) Weight:  [51.3 kg] 51.3 kg (11/30 2055)   Physical Exam Constitutional:      General: She is not in  acute distress.    Appearance: She is not ill-appearing, toxic-appearing or diaphoretic.  Cardiovascular:     Rate and Rhythm: Normal rate and regular rhythm.  Pulmonary:     Effort: No respiratory distress.     Breath sounds: Normal breath sounds.     Comments: No supplemental oxygen in place at time of exam Abdominal:     General: There is no distension.     Palpations: Abdomen is soft.     Tenderness: There is no abdominal tenderness. There is no guarding.  Musculoskeletal:     Comments: Bilateral wrist restraints in place  Neurological:     Mental Status: She is alert.     Labs: Recent Labs    04/09/22 2200 04/10/22 0408 04/10/22 1751  WBC 12.8*  --  14.5*  HGB 7.6* 6.1* 10.3*  HCT 25.6* 20.7* 31.6*  PLT 322  --  363   BMET Recent Labs    04/09/22 2200  NA 134*  K 3.8  CL 103  CO2 21*  GLUCOSE 187*  BUN 17  CREATININE 0.95  CALCIUM 9.1   LFT Recent Labs    04/09/22 2200  PROT 6.9  ALBUMIN 3.3*  AST 37  ALT 40  ALKPHOS 36*  BILITOT <0.1*   PT/INR Recent Labs    04/10/22 0114  LABPROT 14.5  INR 1.1    Diagnostic imaging: CT HEAD WO CONTRAST (5MM)  Result Date: 04/09/2022 CLINICAL DATA:  Delirium EXAM: CT HEAD WITHOUT CONTRAST TECHNIQUE: Contiguous axial images were obtained from the base of the skull through the vertex without intravenous contrast. RADIATION DOSE REDUCTION: This exam was performed according to the departmental dose-optimization program which includes automated exposure control, adjustment of the mA and/or kV according to patient size and/or use of iterative reconstruction technique. COMPARISON:  None Available. FINDINGS: Brain: There is no mass, hemorrhage or extra-axial collection. There is generalized atrophy without lobar predilection. Hypodensity of the white matter is most commonly associated with chronic microvascular disease. Old left occipital infarct. Vascular: No abnormal hyperdensity of the major intracranial arteries or  dural venous sinuses. No intracranial atherosclerosis. Skull: The visualized skull base, calvarium and extracranial soft tissues are normal. Sinuses/Orbits: No fluid levels or advanced mucosal thickening of the visualized paranasal sinuses. No mastoid or middle ear effusion. The orbits are normal. IMPRESSION: 1. No acute intracranial abnormality. 2. Old left occipital infarct and findings of chronic microvascular disease. Electronically Signed   By: Ulyses Jarred M.D.   On: 04/09/2022 23:53   DG Chest 2 View  Result Date: 04/09/2022 CLINICAL DATA:  Altered level of consciousness, COVID EXAM: CHEST - 2 VIEW COMPARISON:  12/14/2021 FINDINGS:  Frontal and lateral views of the chest demonstrates stable enlargement of the cardiac silhouette. No acute airspace disease, effusion, or pneumothorax. No acute bony abnormalities. IMPRESSION: 1. No acute intrathoracic process. Electronically Signed   By: Randa Ngo M.D.   On: 04/09/2022 21:55    IMPRESSION: Melena Normocytic anemia Leukocytosis Ulcerative colitis Alzheimer's dementia  PLAN: -Spoke with internal medicine team who had discussed plan with family, do not wish patient to undergo endoscopic intervention unless there is unstable GI bleeding, not currently present -IV PPI every 12 hours -Trend H/H, transfuse for hemoglobin less than 7 -Continue Lialda 2.4 g p.o. daily for ulcerative colitis treatment -Steroid treatment as needed for adrenal insufficiency -Supportive care for COVID-19 -Gastroenterology team will be available as needed   LOS: 0 days   Danton Clap, [facility] Gastroenterology

## 2022-04-10 NOTE — ED Notes (Signed)
Speech pathology at bedside.

## 2022-04-10 NOTE — ED Provider Notes (Signed)
  Physical Exam  BP 127/63 (BP Location: Right Arm)   Pulse 81   Temp 97.8 F (36.6 C) (Oral)   Resp (!) 21   Ht '5\' 1"'$  (1.549 m)   Wt 51.3 kg   SpO2 98%   BMI 21.35 kg/m   Physical Exam  Procedures  Procedures  ED Course / MDM   Clinical Course as of 04/10/22 0055  Thu Apr 09, 2022  2245 CBC with Differential(!) Baseline appears to be around 12, will get occult stool [BH]  Fri Apr 10, 2022  0030 Consult to Dr. Nevada Crane who is agreeable to admitting this patient to her service. I appreciate her collaboration in the care of this patient.  [RS]    Clinical Course User Index [BH] Henderly, Britni A, PA-C [RS] Khaleel Beckom, Gypsy Balsam, PA-C   Medical Decision Making Amount and/or Complexity of Data Reviewed Labs: ordered. Decision-making details documented in ED Course. Radiology: ordered.  Risk Decision regarding hospitalization.   Care of the patient from preceding ED provider Waymon Amato, [place].  Please see her associated further insight and the patient's record.  In brief patient is a 88 year old female presenting for memory care center with a few days of melena and decreased level of alertness.  Hemodynamically stable throughout her stay in the ED, on day 10 of COVID-19 infection.  Gross melena on rectal exam per preceding ED provider, Hemoccult positive.  Hemoglobin down to 7.6 her baseline. 12.  No frank hemorrhage in the ED.  Normal CT head and chest x-ray.  Patient will require admission to the hospital for acute blood loss anemia apparently from upper GI bleed.  Protonix bolus and infusion initiated.  Temperature change patient pending consult to hospitalist. Consult to hospitalist as above.  No further comport in the ER at this time.  This chart was dictated using voice recognition software, Dragon. Despite the best efforts of this provider to proofread and correct errors, errors may still occur which can change documentation meaning.        Emeline Darling, PA-C 04/10/22 0056    Ripley Fraise, MD 04/10/22 [id number]

## 2022-04-10 NOTE — ED Notes (Signed)
ED TO INPATIENT HANDOFF REPORT  ED Nurse Name and Phone #: [phone]  S Name/Age/Gender Sierra [name] 88 y.o. female Room/Bed: 039C/039C  Code Status   Code Status: DNR  Home/SNF/Other Home Patient oriented to: self Is this baseline? Yes   Triage Complete: Triage complete  Chief Complaint GI (gastrointestinal bleed) [K92.2] Upper GI bleeding [K92.2]  Triage Note Per staff increased confusion symptoms starting '@0700'$  11/30. Pt has PMH of Alzhemers Dementia and recent dx of COVID (unknown date positive but 2 days until clear)  and uti with 2 days of Macrobid left. Pt is alert and oriented to person at baseline, denies pain or trauma.    Allergies Allergies  Allergen Reactions   Aricept [Donepezil] Other (See Comments)    Not specified on MAR   Sulfamethoxazole Nausea And Vomiting   Tape Other (See Comments)    TAPE TEARS AND BRUISES THE SKIN!! Not listed on MAR   Biaxin [Clarithromycin] Other (See Comments)    Bitter taste   Ciprofloxacin Rash   Codeine Nausea And Vomiting   Dilantin [Phenytoin] Rash   Doxycycline Nausea Only   Lactose Intolerance (Gi) Other (See Comments)    Gas Not listed on MAR   Restasis [Cyclosporine] Other (See Comments) and Swelling    Eyes burn    Sulfonamide Derivatives Rash    Level of Care/Admitting Diagnosis ED Disposition     ED Disposition  Admit   Condition  --   [facility]: [facility] [[id number]]  Level of Care: Telemetry Medical [[id number]]  May admit patient to [facility] or [facility] if equivalent level of care is available:: Yes  Covid Evaluation: Confirmed COVID Positive  Diagnosis: Upper GI bleeding [284132]  Admitting Physician: Jonetta Osgood [[id number]]  Attending Physician: Jonetta Osgood [place]  Bed request comments: 5west if possible please  Certification:: I certify this patient will need inpatient services for at least 2 midnights          B Medical/Surgery History Past Medical  History:  Diagnosis Date   Acute asthmatic bronchitis    Acute respiratory failure with hypoxia ([place]) 06/11/2016   Allergic rhinitis    Arthritis    Hx R shoulder bursitis, pain L knee and L hip   Asthmatic bronchitis    ATYPICAL MYCOBACTERIAL INFECTION 05/05/2010   Sputum cx Pos 12/ 2011     Balance problem    SINCE BRAIN TUMOR REMOVED IN 2002-BENIGN TUMOR-PT HAS ADRENAL INSUFFICIENCY AND TAKES DAILY PREDNISONE   Blood transfusion    Cardiac murmur    DOES NOT CAUSE ANY SYMPTOMS   Chronic kidney disease    Chronic pharyngitis    Colonic diverticular abscess 02/01/2017   Diverticulitis, colon    Dizziness    DOE (dyspnea on exertion)    Esophageal reflux    GI bleed 01/06/2013   Hyperlipemia    Hypertension    Hypothyroidism    IBS (irritable bowel syndrome)    Leukocytosis    LGI bleed 01/05/2013   Other specified iron deficiency anemias    PNEUMONIA 06/02/2010   Qualifier: Diagnosis of  By: Annamaria Boots MD, Clinton D    Postop Hyponatremia 07/28/2011   Rectal bleeding 01/03/2014   Recurrent upper respiratory infection (URI) 07/10/2011    ACUTE BRONCHITIS - extra prednisone in addition to the daily prednisone and a Z-Pak   Thrombocytosis    THRUSH 04/30/2009   Qualifier: Diagnosis of  By: Annamaria Boots MD, Clinton D    Ulceration,  colon    Ulcerative colitis    Past Surgical History:  Procedure Laterality Date   ABDOMINAL HYSTERECTOMY  1985   APPENDECTOMY     COLONOSCOPY N/A 01/05/2013   Procedure: COLONOSCOPY;  Surgeon: Cleotis Nipper, MD;  Location: [facility] ENDOSCOPY;  Service: Endoscopy;  Laterality: N/A;   COLONOSCOPY N/A 01/04/2014   Procedure: COLONOSCOPY;  Surgeon: Winfield Cunas., MD;  Location: [facility] ENDOSCOPY;  Service: Endoscopy;  Laterality: N/A;   CRANIOTOMY     DILATION AND CURETTAGE OF UTERUS  1967   ESOPHAGOGASTRODUODENOSCOPY N/A 01/05/2013   Procedure: ESOPHAGOGASTRODUODENOSCOPY (EGD);  Surgeon: Cleotis Nipper, MD;  Location: [facility] ENDOSCOPY;  Service: Endoscopy;   Laterality: N/A;   ESOPHAGOGASTRODUODENOSCOPY N/A 06/26/2014   Procedure: ESOPHAGOGASTRODUODENOSCOPY (EGD);  Surgeon: Winfield Cunas., MD;  Location: [facility] ENDOSCOPY;  Service: Endoscopy;  Laterality: N/A;   EYE SURGERY     2003 -RIGHT CATARACT EXTRACTED AND LEFT WAS DONE IN 2004   FLEXIBLE SIGMOIDOSCOPY N/A 06/26/2014   Procedure: FLEXIBLE SIGMOIDOSCOPY;  Surgeon: Winfield Cunas., MD;  Location: [facility] ENDOSCOPY;  Service: Endoscopy;  Laterality: N/A;   FRONTALIS SUSPENSION  10-09-2010   lifting eyelids AND SECOND EYE SURGERY November 19, 2010   meningioma resected  20O2   PARTIAL COLECTOMY  2008   subglottal mucocoel  2000   THYROIDECTOMY  1986   TONSILLECTOMY  1938   TOTAL HIP ARTHROPLASTY  OCT 2006   right   TOTAL HIP ARTHROPLASTY  07/27/2011   Procedure: TOTAL HIP ARTHROPLASTY;  Surgeon: Gearlean Alf, MD;  Location: [facility] ORS;  Service: Orthopedics;  Laterality: Left;   VESICOVAGINAL FISTULA CLOSURE W/ TAH     WRIST TENDON LESION REMOVED 2007       A IV Location/Drains/Wounds Patient Lines/Drains/Airways Status     Active Line/Drains/Airways     Name Placement date Placement time Site Days   Peripheral IV 04/09/22 Anterior;Right Forearm 04/09/22  2204  Forearm  1   External Urinary Catheter 12/16/21  1452  --  115   Wound / Incision (Open or Dehisced) 03/06/17 Laceration Head Posterior 03/06/17  2100  Head  1861            Intake/Output Last 24 hours  Intake/Output Summary (Last 24 hours) at 04/10/2022 1737 Last data filed at 04/10/2022 1329 Gross per 24 hour  Intake 495 ml  Output --  Net 495 ml    Labs/Imaging Results for orders placed or performed during the hospital encounter of 04/09/22 (from the past 48 hour(s))  Urinalysis, Routine w reflex microscopic     Status: Abnormal   Collection Time: 04/09/22  9:14 PM  Result Value Ref Range   Color, Urine YELLOW YELLOW   APPearance CLEAR CLEAR   Specific Gravity, Urine 1.014 1.005 - 1.030   pH 5.0 5.0 - 8.0    Glucose, UA NEGATIVE NEGATIVE mg/dL   Hgb urine dipstick NEGATIVE NEGATIVE   Bilirubin Urine NEGATIVE NEGATIVE   Ketones, ur NEGATIVE NEGATIVE mg/dL   Protein, ur NEGATIVE NEGATIVE mg/dL   Nitrite NEGATIVE NEGATIVE   Leukocytes,Ua TRACE (A) NEGATIVE   RBC / HPF 0-5 0 - 5 RBC/hpf   WBC, UA 0-5 0 - 5 WBC/hpf   Bacteria, UA NONE SEEN NONE SEEN   Squamous Epithelial / LPF 0-5 0 - 5    Comment: Performed at [facility], [place] [address], [place], [place] [address]  CBC with Differential     Status: Abnormal   Collection Time: 04/09/22  10:00 PM  Result Value Ref Range   WBC 12.8 (H) 4.0 - 10.5 K/uL   RBC 2.86 (L) 3.87 - 5.11 MIL/uL   Hemoglobin 7.6 (L) 12.0 - 15.0 g/dL   HCT 25.6 (L) 36.0 - 46.0 %   MCV 89.5 80.0 - 100.0 fL   MCH 26.6 26.0 - 34.0 pg   MCHC 29.7 (L) 30.0 - 36.0 g/dL   RDW 15.7 (H) 11.5 - 15.5 %   Platelets 322 150 - 400 K/uL   nRBC 0.0 0.0 - 0.2 %   Neutrophils Relative % 81 %   Neutro Abs 10.4 (H) 1.7 - 7.7 K/uL   Lymphocytes Relative 10 %   Lymphs Abs 1.2 0.7 - 4.0 K/uL   Monocytes Relative 7 %   Monocytes Absolute 0.9 0.1 - 1.0 K/uL   Eosinophils Relative 0 %   Eosinophils Absolute 0.0 0.0 - 0.5 K/uL   Basophils Relative 0 %   Basophils Absolute 0.0 0.0 - 0.1 K/uL   Immature Granulocytes 2 %   Abs Immature Granulocytes 0.23 (H) 0.00 - 0.07 K/uL    Comment: Performed at [facility], [address] [place], [place] [address]  Comprehensive metabolic panel     Status: Abnormal   Collection Time: 04/09/22 10:00 PM  Result Value Ref Range   Sodium 134 (L) 135 - 145 mmol/L   Potassium 3.8 3.5 - 5.1 mmol/L   Chloride 103 98 - 111 mmol/L   CO2 21 (L) 22 - 32 mmol/L   Glucose, Bld 187 (H) 70 - 99 mg/dL    Comment: Glucose reference range applies only to samples taken after fasting for at least 8 hours.   BUN 17 8 - 23 mg/dL   Creatinine, Ser 0.95 0.44 - 1.00 mg/dL   Calcium 9.1 8.9 - 10.3 mg/dL   Total Protein 6.9 6.5 - 8.1 g/dL   Albumin 3.3 (L)  3.5 - 5.0 g/dL   AST 37 15 - 41 U/L   ALT 40 0 - 44 U/L   Alkaline Phosphatase 36 (L) 38 - 126 U/L   Total Bilirubin <0.1 (L) 0.3 - 1.2 mg/dL   GFR, Estimated 57 (L) >60 mL/min    Comment: (NOTE) Calculated using the CKD-EPI Creatinine Equation (2021)    Anion gap 10 5 - 15    Comment: Performed at [facility] [address] [place], [place] [address]  Resp Panel by RT-PCR (Flu A&B, Covid) Anterior Nasal Swab     Status: Abnormal   Collection Time: 04/09/22 10:00 PM   Specimen: Anterior Nasal Swab  Result Value Ref Range   SARS Coronavirus 2 by RT PCR POSITIVE (A) NEGATIVE    Comment: (NOTE) SARS-CoV-2 target nucleic acids are DETECTED.  The SARS-CoV-2 RNA is generally detectable in upper respiratory specimens during the acute phase of infection. Positive results are indicative of the presence of the identified virus, but do not rule out bacterial infection or co-infection with other pathogens not detected by the test. Clinical correlation with patient history and other diagnostic information is necessary to determine patient infection status. The expected result is Negative.  Fact Sheet for Patients: EntrepreneurPulse.com.au  Fact Sheet for Healthcare Providers: IncredibleEmployment.be  This test is not yet approved or cleared by the [place] FDA and  has been authorized for detection and/or diagnosis of SARS-CoV-2 by FDA under an Emergency Use Authorization (EUA).  This EUA will remain in effect (meaning this test can be used) for the duration  of  the COVID-19 declaration under Section 564(b)(1) of the A ct, 21 U.S.C. section 360bbb-3(b)(1), unless the authorization is terminated or revoked sooner.     Influenza A by PCR NEGATIVE NEGATIVE   Influenza B by PCR NEGATIVE NEGATIVE    Comment: (NOTE) The Xpert Xpress SARS-CoV-2/FLU/RSV plus assay is intended as an aid in the diagnosis of influenza from Nasopharyngeal swab  specimens and should not be used as a sole basis for treatment. Nasal washings and aspirates are unacceptable for Xpert Xpress SARS-CoV-2/FLU/RSV testing.  Fact Sheet for Patients: EntrepreneurPulse.com.au  Fact Sheet for Healthcare Providers: IncredibleEmployment.be  This test is not yet approved or cleared by the [place] FDA and has been authorized for detection and/or diagnosis of SARS-CoV-2 by FDA under an Emergency Use Authorization (EUA). This EUA will remain in effect (meaning this test can be used) for the duration of the COVID-19 declaration under Section 564(b)(1) of the Act, 21 U.S.C. section 360bbb-3(b)(1), unless the authorization is terminated or revoked.  Performed at [facility], [place] [address], [place], [place] [address]   POC occult blood, ED     Status: Abnormal   Collection Time: 04/09/22 10:51 PM  Result Value Ref Range   Fecal Occult Bld POSITIVE (A) NEGATIVE  Protime-INR     Status: None   Collection Time: 04/10/22  1:14 AM  Result Value Ref Range   Prothrombin Time 14.5 11.4 - 15.2 seconds   INR 1.1 0.8 - 1.2    Comment: (NOTE) INR goal varies based on device and disease states. Performed at [facility], [place] [address] [place], [place] [address]   Type and screen [place]     Status: None (Preliminary result)   Collection Time: 04/10/22  1:16 AM  Result Value Ref Range   ABO/RH(D) A POS    Antibody Screen NEG    Sample Expiration 04/13/2022,2359    Unit Number [id number]    Blood Component Type RED CELLS,LR    Unit division 00    Status of Unit ISSUED    Transfusion Status OK TO TRANSFUSE    Crossmatch Result      Compatible Performed at [facility], [place] [address], [place], [place] [address]   Prepare RBC (crossmatch)     Status: None   Collection Time: 04/10/22  2:32 AM  Result Value Ref Range   Order Confirmation      ORDER PROCESSED BY BLOOD  BANK Performed at [facility], [place] [address], [place], [place] [address]   Hemoglobin and hematocrit, blood     Status: Abnormal   Collection Time: 04/10/22  4:08 AM  Result Value Ref Range   Hemoglobin 6.1 (LL) 12.0 - 15.0 g/dL    Comment: REPEATED TO VERIFY THIS CRITICAL RESULT HAS VERIFIED AND BEEN CALLED TO WHITLEY FARMER RN BY BERTRAM Senft ON 12 01 2023 AT 0436, AND HAS BEEN READ BACK.     HCT 20.7 (L) 36.0 - 46.0 %    Comment: Performed at [facility], [place] [address], [place], [place] [address]   CT HEAD WO CONTRAST (5MM)  Result Date: 04/09/2022 CLINICAL DATA:  [place] EXAM: CT HEAD WITHOUT CONTRAST TECHNIQUE: Contiguous axial images were obtained from the base of the skull through the vertex without intravenous contrast. RADIATION DOSE REDUCTION: This exam was performed according to the departmental dose-optimization program which includes automated exposure control, adjustment of the mA and/or kV according to patient size  and/or use of iterative reconstruction technique. COMPARISON:  None Available. FINDINGS: Brain: There is no mass, hemorrhage or extra-axial collection. There is generalized atrophy without lobar predilection. Hypodensity of the white matter is most commonly associated with chronic microvascular disease. Old left occipital infarct. Vascular: No abnormal hyperdensity of the major intracranial arteries or dural venous sinuses. No intracranial atherosclerosis. Skull: The visualized skull base, calvarium and extracranial soft tissues are normal. Sinuses/Orbits: No fluid levels or advanced mucosal thickening of the visualized paranasal sinuses. No mastoid or middle ear effusion. The orbits are normal. IMPRESSION: 1. No acute intracranial abnormality. 2. Old left occipital infarct and findings of chronic microvascular disease. Electronically Signed   By: Ulyses Jarred M.D.   On: 04/09/2022 23:53   DG Chest 2 View  Result Date: 04/09/2022 CLINICAL DATA:   Altered level of consciousness, COVID EXAM: CHEST - 2 VIEW COMPARISON:  12/14/2021 FINDINGS: Frontal and lateral views of the chest demonstrates stable enlargement of the cardiac silhouette. No acute airspace disease, effusion, or pneumothorax. No acute bony abnormalities. IMPRESSION: 1. No acute intrathoracic process. Electronically Signed   By: Randa Ngo M.D.   On: 04/09/2022 21:55    Pending Labs Unresulted Labs (From admission, onward)     Start     Ordered   04/10/22 1700  CBC  5A & 5P,   R      04/10/22 1041   04/10/22 0500  Comprehensive metabolic panel  Tomorrow morning,   R        04/10/22 0256   04/09/22 2114  Urine Culture  Once,   URGENT       Question:  Indication  Answer:  Altered mental status (if no other cause identified)   04/09/22 2115            Vitals/Pain Today's Vitals   04/10/22 1330 04/10/22 1345 04/10/22 1645 04/10/22 1700  BP: (!) 151/70 (!) 142/64 (!) 146/69 (!) 140/70  Pulse: 74 71 68 66  Resp: 20 (!) 21 (!) 21 (!) 22  Temp: 98 F (36.7 C)     TempSrc: Oral     SpO2:  92% 95% 97%  Weight:      Height:      PainSc:        Isolation Precautions Airborne and Contact precautions  Medications Medications  pantoprozole (PROTONIX) 80 mg /NS 100 mL infusion (8 mg/hr Intravenous New Bag/Given 04/10/22 0113)  budesonide (PULMICORT) nebulizer solution 0.25 mg (0.25 mg Nebulization Not Given 04/10/22 1022)  albuterol (PROVENTIL) (2.5 MG/3ML) 0.083% nebulizer solution 2.5 mg (has no administration in time range)  acetaminophen (TYLENOL) tablet 650 mg (has no administration in time range)  ondansetron (ZOFRAN) injection 4 mg (has no administration in time range)  predniSONE (DELTASONE) tablet 5 mg (5 mg Oral Given 04/10/22 1129)  levothyroxine (SYNTHROID) tablet 75 mcg (75 mcg Oral Not Given 04/10/22 0649)  LORazepam (ATIVAN) tablet 0.5 mg (0.5 mg Oral Given 04/10/22 1130)  memantine (NAMENDA) tablet 5 mg (5 mg Oral Given 04/10/22 1130)  mesalamine  (LIALDA) EC tablet 1.2 g (has no administration in time range)  rosuvastatin (CRESTOR) tablet 20 mg (20 mg Oral Given 04/10/22 1130)  escitalopram (LEXAPRO) tablet 20 mg (20 mg Oral Given 04/10/22 1128)  pantoprazole (PROTONIX) 80 mg /NS 100 mL IVPB (0 mg Intravenous Stopped 04/10/22 0149)  0.9 %  sodium chloride infusion (Manually program via Guardrails IV Fluids) (0 mLs Intravenous Stopped 04/10/22 1330)    Mobility walks with person assist  Focused Assessments Neuro Assessment Handoff:  Swallow screen pass? Yes          Neuro Assessment: Within Defined Limits Neuro Checks:      Last Documented NIHSS Modified Score:   Has TPA been given? No If patient is a Neuro Trauma and patient is going to OR before floor call report to [place] nurse: [phone] or [phone]   R Recommendations: See Admitting Provider Note  Report given to:   Additional Notes:

## 2022-04-10 NOTE — ED Notes (Signed)
RN spoke with MD Nevada Crane who informed that son and daughter [name] and Rolm Bookbinder on mother receiving blood.

## 2022-04-10 NOTE — Progress Notes (Signed)
PROGRESS NOTE        PATIENT DETAILS Name: Sierra [name] Age: 88 y.o. Sex: female Date of Birth: [birth year] Admit Date: 04/09/2022 Admitting Physician Kayleen Memos, DO KGU:RKYHCW, Christian Mate, MD  Brief Summary: Patient is a 88 y.o.  female with history of dementia, prior CVA, adrenal insufficiency on prednisone, HTN, HLD-who was sent from memory care unit for melanotic stools in her diaper, she was found to have acute blood loss anemia and subsequently admitted to the hospitalist service.  She was incidentally found to be COVID-positive.  Significant events: 11/30>> admitted [facility]-GI bleeding with acute blood loss anemia.  Incidentally COVID-positive.  Significant studies: 11/30>> CXR: No PNA  11/30>> CT head: No acute intracranial abnormality  Significant microbiology data: 11/30>> COVID PCR: Positive (cycle threshold 35.2) 11/30>> influenza A/B: Negative  Procedures: None  Consults: GI  Subjective: Pleasantly-confused.  Unable to provide any history.  No family at bedside.  Per RN-no major issues overnight-getting first unit of PRBC transfusion.  RN has not noted patient coughing.  On room air.  Objective: Vitals: Blood pressure (!) 156/71, pulse 72, temperature 97.8 F (36.6 C), temperature source Oral, resp. rate 19, height '5\' 1"'$  (1.549 m), weight 51.3 kg, SpO2 95 %.   Exam: Gen Exam:not in any distress HEENT:atraumatic, normocephalic Chest: B/L clear to auscultation anteriorly CVS:S1S2 regular Abdomen:soft non tender, non distended Extremities:no edema Neurology: Difficult exam but seems to be moving all 4 extremities. Skin: no rash  Pertinent Labs/Radiology:    Latest Ref Rng & Units 04/10/2022    4:08 AM 04/09/2022   10:00 PM 12/16/2021    5:59 AM  CBC  WBC 4.0 - 10.5 K/uL  12.8  15.7   Hemoglobin 12.0 - 15.0 g/dL 6.1  7.6  12.9   Hematocrit 36.0 - 46.0 % 20.7  25.6  39.0   Platelets 150 - 400 K/uL  322  302     Lab Results   Component Value Date   NA 134 (L) 04/09/2022   K 3.8 04/09/2022   CL 103 04/09/2022   CO2 21 (L) 04/09/2022      Assessment/Plan: Upper GI bleeding Acute blood loss anemia Apparently had melanotic stools in her diaper at memory care On aspirin for recent CVA-this has been held. No further bleeding overnight per RN staff-but hemoglobin dropped further. Being transfused 1 unit of PRBC-follow-up posttransfusion CBC Continue PPI Spoke with son-family does not want to pursue endoscopic evaluation at this point-plan is to transfuse-continue PPI and follow closely.  Family will only possibly consent to EGD if she were to have unstable hemodynamics .  Apparently when she had a GI bleed in the past-this approach was pursued as well.  Will inform GI team. Given advanced age/frailty-stable GI bleed-suspect above is a reasonable option.  COVID-19 infection Completed 10 days of isolation at her memory care unit  Per MAR-completed course of Paxlovid 11/27  No hypoxia-chest x-ray without PNA  Monitor for now.   Acute metabolic encephalopathy likely due to recent COVID infection/dehydration Delirium superimposed on advanced dementia Supportive care Maintain delirium precautions  History of recent CVA August 2023 Difficult exam but seems to be moving all 4 extremities Hold antiplatelets given acute GI bleeding Continue statin  Hypothyroidism Levothyroxine  Adrenal insufficiency Continue prednisone  Ulcerative colitis Seems stable at present Continue mesalamine  HTN BP slightly on the  higher side Hold amlodipine-until sure that GI bleeding does not recur  Dementia Resume Namenda/lorazepam/Lexapro  Palliative care DNR in place Gentle medical treatment Family does not want to pursue EGD unless she becomes hemodynamically unstable   BMI: Estimated body mass index is 21.35 kg/m as calculated from the following:   Height as of this encounter: '5\' 1"'$  (1.549 m).   Weight as of  this encounter: 51.3 kg.   Code status:   Code Status: DNR   DVT Prophylaxis: SCDs Start: 04/10/22 0031   Family Communication: Tinie Mcgloin [phone] updated over the phone on 12/1. Daughter-Leslie Lenord Fellers [phone] left voicemail 12/1  Disposition Plan: Status is: Inpatient Remains inpatient appropriate because: UGI bleed with ABLA   Planned Discharge Destination:Assisted living   Diet: Diet Order             Diet NPO time specified Except for: Sips with Meds  Diet effective now                     Antimicrobial agents: Anti-infectives (From admission, onward)    None        MEDICATIONS: Scheduled Meds:  sodium chloride   Intravenous Once   budesonide  0.25 mg Nebulization BID   levothyroxine  75 mcg Oral Q0600   predniSONE  5 mg Oral Q breakfast   Continuous Infusions:  pantoprazole 8 mg/hr (04/10/22 0113)   PRN Meds:.acetaminophen, albuterol, ondansetron (ZOFRAN) IV   I have personally reviewed following labs and imaging studies  LABORATORY DATA: CBC: Recent Labs  Lab 04/09/22 2200 04/10/22 0408  WBC 12.8*  --   NEUTROABS 10.4*  --   HGB 7.6* 6.1*  HCT 25.6* 20.7*  MCV 89.5  --   PLT 322  --     Basic Metabolic Panel: Recent Labs  Lab 04/09/22 2200  NA 134*  K 3.8  CL 103  CO2 21*  GLUCOSE 187*  BUN 17  CREATININE 0.95  CALCIUM 9.1    GFR: Estimated Creatinine Clearance: 29.1 mL/min (by C-G formula based on SCr of 0.95 mg/dL).  Liver Function Tests: Recent Labs  Lab 04/09/22 2200  AST 37  ALT 40  ALKPHOS 36*  BILITOT <0.1*  PROT 6.9  ALBUMIN 3.3*   No results for input(s): "LIPASE", "AMYLASE" in the last 168 hours. No results for input(s): "AMMONIA" in the last 168 hours.  Coagulation Profile: Recent Labs  Lab 04/10/22 0114  INR 1.1    Cardiac Enzymes: No results for input(s): "CKTOTAL", "CKMB", "CKMBINDEX", "TROPONINI" in the last 168 hours.  BNP (last 3 results) No results for input(s):  "PROBNP" in the last 8760 hours.  Lipid Profile: No results for input(s): "CHOL", "HDL", "LDLCALC", "TRIG", "CHOLHDL", "LDLDIRECT" in the last 72 hours.  Thyroid Function Tests: No results for input(s): "TSH", "T4TOTAL", "FREET4", "T3FREE", "THYROIDAB" in the last 72 hours.  Anemia Panel: No results for input(s): "VITAMINB12", "FOLATE", "FERRITIN", "TIBC", "IRON", "RETICCTPCT" in the last 72 hours.  Urine analysis:    Component Value Date/Time   COLORURINE YELLOW 04/09/2022 2114   APPEARANCEUR CLEAR 04/09/2022 2114   LABSPEC 1.014 04/09/2022 2114   PHURINE 5.0 04/09/2022 2114   GLUCOSEU NEGATIVE 04/09/2022 2114   HGBUR NEGATIVE 04/09/2022 2114   BILIRUBINUR NEGATIVE 04/09/2022 2114   [place] NEGATIVE 04/09/2022 2114   PROTEINUR NEGATIVE 04/09/2022 2114   UROBILINOGEN 0.2 12/20/2014 2042   NITRITE NEGATIVE 04/09/2022 2114   LEUKOCYTESUR TRACE (A) 04/09/2022 2114    Sepsis Labs: Lactic Acid, Venous  Component Value Date/Time   LATICACIDVEN 1.6 12/31/2019 2112    MICROBIOLOGY: Recent Results (from the past 240 hour(s))  Resp Panel by RT-PCR (Flu A&B, Covid) Anterior Nasal Swab     Status: Abnormal   Collection Time: 04/09/22 10:00 PM   Specimen: Anterior Nasal Swab  Result Value Ref Range Status   SARS Coronavirus 2 by RT PCR POSITIVE (A) NEGATIVE Final    Comment: (NOTE) SARS-CoV-2 target nucleic acids are DETECTED.  The SARS-CoV-2 RNA is generally detectable in upper respiratory specimens during the acute phase of infection. Positive results are indicative of the presence of the identified virus, but do not rule out bacterial infection or co-infection with other pathogens not detected by the test. Clinical correlation with patient history and other diagnostic information is necessary to determine patient infection status. The expected result is Negative.  Fact Sheet for Patients: EntrepreneurPulse.com.au  Fact Sheet for Healthcare  Providers: IncredibleEmployment.be  This test is not yet approved or cleared by the [place] FDA and  has been authorized for detection and/or diagnosis of SARS-CoV-2 by FDA under an Emergency Use Authorization (EUA).  This EUA will remain in effect (meaning this test can be used) for the duration of  the COVID-19 declaration under Section 564(b)(1) of the A ct, 21 U.S.C. section 360bbb-3(b)(1), unless the authorization is terminated or revoked sooner.     Influenza A by PCR NEGATIVE NEGATIVE Final   Influenza B by PCR NEGATIVE NEGATIVE Final    Comment: (NOTE) The Xpert Xpress SARS-CoV-2/FLU/RSV plus assay is intended as an aid in the diagnosis of influenza from Nasopharyngeal swab specimens and should not be used as a sole basis for treatment. Nasal washings and aspirates are unacceptable for Xpert Xpress SARS-CoV-2/FLU/RSV testing.  Fact Sheet for Patients: EntrepreneurPulse.com.au  Fact Sheet for Healthcare Providers: IncredibleEmployment.be  This test is not yet approved or cleared by the [place] FDA and has been authorized for detection and/or diagnosis of SARS-CoV-2 by FDA under an Emergency Use Authorization (EUA). This EUA will remain in effect (meaning this test can be used) for the duration of the COVID-19 declaration under Section 564(b)(1) of the Act, 21 U.S.C. section 360bbb-3(b)(1), unless the authorization is terminated or revoked.  Performed at [facility], [place] [address], [place], [place] [address]     RADIOLOGY STUDIES/RESULTS: CT HEAD WO CONTRAST (5MM)  Result Date: 04/09/2022 CLINICAL DATA:  Delirium EXAM: CT HEAD WITHOUT CONTRAST TECHNIQUE: Contiguous axial images were obtained from the base of the skull through the vertex without intravenous contrast. RADIATION DOSE REDUCTION: This exam was performed according to the departmental dose-optimization program which includes automated  exposure control, adjustment of the mA and/or kV according to patient size and/or use of iterative reconstruction technique. COMPARISON:  None Available. FINDINGS: Brain: There is no mass, hemorrhage or extra-axial collection. There is generalized atrophy without lobar predilection. Hypodensity of the white matter is most commonly associated with chronic microvascular disease. Old left occipital infarct. Vascular: No abnormal hyperdensity of the major intracranial arteries or dural venous sinuses. No intracranial atherosclerosis. Skull: The visualized skull base, calvarium and extracranial soft tissues are normal. Sinuses/Orbits: No fluid levels or advanced mucosal thickening of the visualized paranasal sinuses. No mastoid or middle ear effusion. The orbits are normal. IMPRESSION: 1. No acute intracranial abnormality. 2. Old left occipital infarct and findings of chronic microvascular disease. Electronically Signed   By: Ulyses Jarred M.D.   On: 04/09/2022 23:53   DG Chest 2 View  Result Date:  04/09/2022 CLINICAL DATA:  Altered level of consciousness, COVID EXAM: CHEST - 2 VIEW COMPARISON:  12/14/2021 FINDINGS: Frontal and lateral views of the chest demonstrates stable enlargement of the cardiac silhouette. No acute airspace disease, effusion, or pneumothorax. No acute bony abnormalities. IMPRESSION: 1. No acute intrathoracic process. Electronically Signed   By: Randa Ngo M.D.   On: 04/09/2022 21:55     LOS: 0 days   Oren Binet, MD  [facility]    To contact the attending provider between 7A-7P or the covering provider during after hours 7P-7A, please log into the web site [email or web address] and access using universal [place] password for that web site. If you do not have the password, please call the hospital operator.  04/10/2022, 10:09 AM

## 2022-04-10 NOTE — Evaluation (Signed)
Clinical/Bedside Swallow Evaluation Patient Details  Name: Sierra [name] MRN: [id number] Date of Birth: 1928/08/25  Today's Date: 04/10/2022 Time: SLP Start Time (ACUTE ONLY): 1155 SLP Stop Time (ACUTE ONLY): 1202 SLP Time Calculation (min) (ACUTE ONLY): 7 min  Past Medical History:  Past Medical History:  Diagnosis Date   Acute asthmatic bronchitis    Acute respiratory failure with hypoxia ([place]) 06/11/2016   Allergic rhinitis    Arthritis    Hx R shoulder bursitis, pain L knee and L hip   Asthmatic bronchitis    ATYPICAL MYCOBACTERIAL INFECTION 05/05/2010   Sputum cx Pos 12/ 2011     Balance problem    SINCE BRAIN TUMOR REMOVED IN 2002-BENIGN TUMOR-PT HAS ADRENAL INSUFFICIENCY AND TAKES DAILY PREDNISONE   Blood transfusion    Cardiac murmur    DOES NOT CAUSE ANY SYMPTOMS   Chronic kidney disease    Chronic pharyngitis    Colonic diverticular abscess 02/01/2017   Diverticulitis, colon    Dizziness    DOE (dyspnea on exertion)    Esophageal reflux    GI bleed 01/06/2013   Hyperlipemia    Hypertension    Hypothyroidism    IBS (irritable bowel syndrome)    Leukocytosis    LGI bleed 01/05/2013   Other specified iron deficiency anemias    PNEUMONIA 06/02/2010   Qualifier: Diagnosis of  By: Annamaria Boots MD, [place] D    Postop Hyponatremia 07/28/2011   Rectal bleeding 01/03/2014   Recurrent upper respiratory infection (URI) 07/10/2011    ACUTE BRONCHITIS - extra prednisone in addition to the daily prednisone and a Z-Pak   Thrombocytosis    THRUSH 04/30/2009   Qualifier: Diagnosis of  By: Annamaria Boots MD, [place] D    Ulceration, colon    Ulcerative colitis    Past Surgical History:  Past Surgical History:  Procedure Laterality Date   ABDOMINAL HYSTERECTOMY  1985   APPENDECTOMY     COLONOSCOPY N/A 01/05/2013   Procedure: COLONOSCOPY;  Surgeon: Cleotis Nipper, MD;  Location: [facility] ENDOSCOPY;  Service: Endoscopy;  Laterality: N/A;   COLONOSCOPY N/A 01/04/2014   Procedure: COLONOSCOPY;   Surgeon: Winfield Cunas., MD;  Location: [facility] ENDOSCOPY;  Service: Endoscopy;  Laterality: N/A;   CRANIOTOMY     DILATION AND CURETTAGE OF UTERUS  1967   ESOPHAGOGASTRODUODENOSCOPY N/A 01/05/2013   Procedure: ESOPHAGOGASTRODUODENOSCOPY (EGD);  Surgeon: Cleotis Nipper, MD;  Location: [facility] ENDOSCOPY;  Service: Endoscopy;  Laterality: N/A;   ESOPHAGOGASTRODUODENOSCOPY N/A 06/26/2014   Procedure: ESOPHAGOGASTRODUODENOSCOPY (EGD);  Surgeon: Winfield Cunas., MD;  Location[facility] ENDOSCOPY;  Service: Endoscopy;  Laterality: N/A;   EYE SURGERY     2003 -RIGHT CATARACT EXTRACTED AND LEFT WAS DONE IN 2004   FLEXIBLE SIGMOIDOSCOPY N/A 06/26/2014   Procedure: FLEXIBLE SIGMOIDOSCOPY;  Surgeon: Winfield Cunas., MD;  Location: [facility] ENDOSCOPY;  Service: Endoscopy;  Laterality: N/A;   FRONTALIS SUSPENSION  10-09-2010   lifting eyelids AND SECOND EYE SURGERY November 19, 2010   meningioma resected  20O2   PARTIAL COLECTOMY  2008   subglottal mucocoel  2000   THYROIDECTOMY  1986   TONSILLECTOMY  1938   TOTAL HIP ARTHROPLASTY  OCT 2006   right   TOTAL HIP ARTHROPLASTY  07/27/2011   Procedure: TOTAL HIP ARTHROPLASTY;  Surgeon: Gearlean Alf, MD;  Location: [facility] ORS;  Service: Orthopedics;  Laterality: Left;   VESICOVAGINAL FISTULA CLOSURE W/ TAH     WRIST TENDON LESION REMOVED 2007  HPI:  Patient is a 88 y.o.  female who was sent from memory care unit for melanotic stools in her diaper, she was found to have acute blood loss anemia and subsequently admitted to the hospitalist service.  She was incidentally found to be COVID-positive. CXR and Head CT 11/30 without acute findings. Pt with history of dementia, prior CVA, adrenal insufficiency on prednisone, HTN, HLD.    Assessment / Plan / Recommendation  Clinical Impression  Pt presents with functional swallowing as assessed clinically.  Pt tolerated alll consistencies trialed with no clinical s/s of aspiration.  Pt seen by this service during prior admission  and completed MBS 12/15/2021 with recommendations for chopped/ground (D2) diet with thin liquid. There was oral holding during that assessment, but no penetration or aspiration.  Today pt exhibited adequate oral clearance of solids, but a limited number of solid trials were given today 2/2 GIB. Pt may advance as tolerated up to a regular diet, as appropriate from a GI standpoint.  Would recommend trial of mechanical soft diet.  If pt begins to hold solids, she could be downgraded to a D2 diet. If mechanical soft solids are unappealing, consider advancing to a regular diet.  Pt has no further ST needs at this time.    Recommend continuing clear liquid diet and advancing as above when medically appropriate.  SLP Visit Diagnosis: Dysphagia, oral phase (R13.11)    Aspiration Risk  No limitations    Diet Recommendation Thin liquid   Liquid Administration via: Straw;Cup Medication Administration:  (As tolerated, no specific precautions) Supervision: Staff to assist with self feeding Compensations: Slow rate;Small sips/bites Postural Changes: Seated upright at 90 degrees    Other  Recommendations Oral Care Recommendations: Oral care BID    Recommendations for follow up therapy are one component of a multi-disciplinary discharge planning process, led by the attending physician.  Recommendations may be updated based on patient status, additional functional criteria and insurance authorization.  Follow up Recommendations No SLP follow up      Assistance Recommended at Discharge  Follow physician recommendations  Functional Status Assessment Patient has not had a recent decline in their functional status  Frequency and Duration  (N/A)          Prognosis Prognosis for Safe Diet Advancement:  (N/A)      Swallow Study   General Date of Onset: 04/09/22 HPI: Patient is a 88 y.o.  female who was sent from memory care unit for melanotic stools in her diaper, she was found to have acute blood loss  anemia and subsequently admitted to the hospitalist service.  She was incidentally found to be COVID-positive. CXR and Head CT 11/30 without acute findings. Pt with history of dementia, prior CVA, adrenal insufficiency on prednisone, HTN, HLD. Type of Study: Bedside Swallow Evaluation Previous Swallow Assessment: MBS 12/15/2021 with oral holding and recs for D2 Diet Prior to this Study: Thin liquids Respiratory Status: Nasal cannula History of Recent Intubation: No Behavior/Cognition: Alert;Confused;Pleasant mood;Cooperative Oral Cavity Assessment: Within Functional Limits Oral Care Completed by SLP: No Oral Cavity - Dentition: Adequate natural dentition;Missing dentition Patient Positioning: Upright in bed Baseline Vocal Quality: Normal Volitional Cough: Cognitively unable to elicit Volitional Swallow: Unable to elicit    Oral/Motor/Sensory Function Overall Oral Motor/Sensory Function:  (Unable to assess 2/2 cognitive impariments) Facial ROM: Within Functional Limits Facial Symmetry: Within Functional Limits   Ice Chips Ice chips: Not tested   Thin Liquid Thin Liquid: Within functional limits Presentation: Straw  Nectar Thick Nectar Thick Liquid: Not tested   Honey Thick Honey Thick Liquid: Not tested   Puree Puree: Within functional limits Presentation: Spoon   Solid     Solid: Within functional limits Presentation:  (SLP fed)      Celedonio Savage, [place], [place] Office: [phone] 04/10/2022,12:16 PM

## 2022-04-10 NOTE — ED Notes (Signed)
RN went to perform Orthostatic vital signs on patient. Patient found to be confused, combative, attempted to get out of bed and removed IV. MD [place] Crane was contacted. Non violent restraints ordered. Charge RN [employer].

## 2022-04-10 NOTE — H&P (Addendum)
History and Physical  Sierra [name]:[id number] DOB: [birth year] DOA: 04/09/2022  Referring physician: Yvonne Kendall, PA-EDP  PCP: Javier Glazier, MD  Outpatient Specialists: Neurology Patient coming from: Memory care.  Chief Complaint: Altered mental status.  HPI: Sierra [name] is a 88 y.o. female with medical history significant for dementia, prior CVA, hypertension, hyperlipidemia, ulcerative colitis, adrenal insufficiency on prednisone, history of GI bleed, who presented from memory care due to more confusion than her baseline.  Also noted to have dark stools in her depends.  She was brought into the ED for further evaluation.  In the ED, noted melanic stools and acute blood loss anemia, hemoglobin 7.6 from baseline of 12.9.  Unable to obtain a history from the patient due to severe confusion.  COVID-19 screening test positive.  Not hypoxic, O2 saturation 98% on room air.  Agitated and pulling at her lines.  Soft restraints initiated.  She was started on Protonix bolus.  The patient was admitted by [facility], hospitalist service   ED Course: Tmax 97.8.  BP 137/61, pulse 78, respiratory rate 21, O2 saturation 98% on room air.  WBC 12.8, hemoglobin 7.6, hematocrit 25.6, platelet count 322.  UA negative for pyuria.  Review of Systems: Review of systems as noted in the HPI. All other systems reviewed and are negative.   Past Medical History:  Diagnosis Date   Acute asthmatic bronchitis    Acute respiratory failure with hypoxia (HCC) 06/11/2016   Allergic rhinitis    Arthritis    Hx R shoulder bursitis, pain L knee and L hip   Asthmatic bronchitis    ATYPICAL MYCOBACTERIAL INFECTION 05/05/2010   Sputum cx Pos 12/ 2011     Balance problem    SINCE BRAIN TUMOR REMOVED IN 2002-BENIGN TUMOR-PT HAS ADRENAL INSUFFICIENCY AND TAKES DAILY PREDNISONE   Blood transfusion    Cardiac murmur    DOES NOT CAUSE ANY SYMPTOMS   Chronic kidney disease    Chronic pharyngitis    Colonic diverticular  abscess 02/01/2017   Diverticulitis, colon    Dizziness    DOE (dyspnea on exertion)    Esophageal reflux    GI bleed 01/06/2013   Hyperlipemia    Hypertension    Hypothyroidism    IBS (irritable bowel syndrome)    Leukocytosis    LGI bleed 01/05/2013   Other specified iron deficiency anemias    PNEUMONIA 06/02/2010   Qualifier: Diagnosis of  By: Annamaria Boots MD, Clinton D    Postop Hyponatremia 07/28/2011   Rectal bleeding 01/03/2014   Recurrent upper respiratory infection (URI) 07/10/2011    ACUTE BRONCHITIS - extra prednisone in addition to the daily prednisone and a Z-Pak   Thrombocytosis    THRUSH 04/30/2009   Qualifier: Diagnosis of  By: Annamaria Boots MD, Clinton D    Ulceration, colon    Ulcerative colitis    Past Surgical History:  Procedure Laterality Date   ABDOMINAL HYSTERECTOMY  1985   APPENDECTOMY     COLONOSCOPY N/A 01/05/2013   Procedure: COLONOSCOPY;  Surgeon: Cleotis Nipper, MD;  Location: [facility] ENDOSCOPY;  Service: Endoscopy;  Laterality: N/A;   COLONOSCOPY N/A 01/04/2014   Procedure: COLONOSCOPY;  Surgeon: Winfield Cunas., MD;  Location: [facility] ENDOSCOPY;  Service: Endoscopy;  Laterality: N/A;   CRANIOTOMY     DILATION AND CURETTAGE OF UTERUS  1967   ESOPHAGOGASTRODUODENOSCOPY N/A 01/05/2013   Procedure: ESOPHAGOGASTRODUODENOSCOPY (EGD);  Surgeon: Cleotis Nipper, MD;  Location: [facility] ENDOSCOPY;  Service: Endoscopy;  Laterality: N/A;  ESOPHAGOGASTRODUODENOSCOPY N/A 06/26/2014   Procedure: ESOPHAGOGASTRODUODENOSCOPY (EGD);  Surgeon: Winfield Cunas., MD;  Location: [facility] ENDOSCOPY;  Service: Endoscopy;  Laterality: N/A;   EYE SURGERY     2003 -RIGHT CATARACT EXTRACTED AND LEFT WAS DONE IN 2004   FLEXIBLE SIGMOIDOSCOPY N/A 06/26/2014   Procedure: FLEXIBLE SIGMOIDOSCOPY;  Surgeon: Winfield Cunas., MD;  Location: [facility] ENDOSCOPY;  Service: Endoscopy;  Laterality: N/A;   FRONTALIS SUSPENSION  10-09-2010   lifting eyelids AND SECOND EYE SURGERY November 19, 2010   meningioma resected  20O2    PARTIAL COLECTOMY  2008   subglottal mucocoel  2000   THYROIDECTOMY  1986   TONSILLECTOMY  1938   TOTAL HIP ARTHROPLASTY  OCT 2006   right   TOTAL HIP ARTHROPLASTY  07/27/2011   Procedure: TOTAL HIP ARTHROPLASTY;  Surgeon: Gearlean Alf, MD;  Location: [facility] ORS;  Service: Orthopedics;  Laterality: Left;   VESICOVAGINAL FISTULA CLOSURE W/ TAH     WRIST TENDON LESION REMOVED 2007      Social History:  reports that she has never smoked. She has never used smokeless tobacco. She reports that she does not currently use alcohol. She reports that she does not use drugs.   Allergies  Allergen Reactions   Aricept [Donepezil] Other (See Comments)    Not specified on MAR   Sulfamethoxazole Nausea And Vomiting   Tape Other (See Comments)    TAPE TEARS AND BRUISES THE SKIN!!   Biaxin [Clarithromycin] Other (See Comments)    Bitter taste   Ciprofloxacin Rash   Codeine Nausea And Vomiting   Dilantin [Phenytoin] Rash   Doxycycline Nausea Only   Lactose Intolerance (Gi) Other (See Comments)    gas   Restasis [Cyclosporine] Other (See Comments) and Swelling    Eyes burn    Sulfonamide Derivatives Rash    Family History  Problem Relation Age of Onset   Heart attack Father        deceased      Prior to Admission medications   Medication Sig Start Date End Date Taking? Authorizing Provider  albuterol (PROVENTIL) (2.5 MG/3ML) 0.083% nebulizer solution Inhale 3 mLs (2.5 mg total) into the lungs every 4 (four) hours as needed for wheezing or shortness of breath. 12/16/21   Nooruddin, Marlene Lard, MD  amLODipine (NORVASC) 2.5 MG tablet Take 2.5 mg by mouth daily.    [provider]  aspirin EC 81 MG tablet Take 81 mg by mouth daily. Swallow whole.    [provider]  budesonide (PULMICORT) 0.25 MG/2ML nebulizer solution Take 0.25 mg by nebulization 2 (two) times daily.    [provider]  escitalopram (LEXAPRO) 20 MG tablet Take 20 mg by mouth daily.    [provider]  ferrous sulfate 325 (65 FE) MG tablet Take 325 mg by mouth daily with breakfast.    [provider]  levothyroxine (SYNTHROID) 75 MCG tablet Take 1 tablet (75 mcg total) by mouth daily at 6 (six) AM. 12/17/21   Nooruddin, Marlene Lard, MD  memantine (NAMENDA) 5 MG tablet Take 5 mg by mouth 2 (two) times daily.    [provider]  mesalamine (LIALDA) 1.2 g EC tablet Take by mouth daily with breakfast.    [provider]  predniSONE (DELTASONE) 5 MG tablet Take 5 mg by mouth daily with breakfast.    [provider]  Probiotic Product (PROBIOTIC-10 PO) Take by mouth.    [provider]  rosuvastatin (CRESTOR) 20 MG tablet  Take 1 tablet (20 mg total) by mouth daily. 02/04/22   Frann Rider, NP  Alum & Mag Hydroxide-Simeth (MAGIC MOUTHWASH) SOLN Take 5 mLs by mouth 3 (three) times daily as needed. Thrush   07/21/11  [provider]    Physical Exam: BP 127/63 (BP Location: Right Arm)   Pulse 81   Temp 97.8 F (36.6 C) (Oral)   Resp (!) 21   Ht '5\' 1"'$  (1.549 m)   Wt 51.3 kg   SpO2 98%   BMI 21.35 kg/m   General: 88 y.o. year-old female well developed well nourished in no acute distress.  Alert, confused and agitated. Cardiovascular: Regular rate and rhythm with no rubs or gallops.  No thyromegaly or JVD noted.  No lower extremity edema. 2/4 pulses in all 4 extremities. Respiratory: Clear to auscultation with no wheezes or rales. Good inspiratory effort. Abdomen: Soft nontender nondistended with normal bowel sounds x4 quadrants. Muskuloskeletal: No cyanosis, clubbing or edema noted bilaterally Neuro: CN II-XII intact, strength, sensation, reflexes Skin: No ulcerative lesions noted or rashes Psychiatry: Unable to assess judgment and mood confusion and agitation.         Labs on Admission:  Basic Metabolic Panel: Recent Labs  Lab 04/09/22 2200  NA 134*  K 3.8  CL 103  CO2 21*  GLUCOSE 187*  BUN 17  CREATININE 0.95   CALCIUM 9.1   Liver Function Tests: Recent Labs  Lab 04/09/22 2200  AST 37  ALT 40  ALKPHOS 36*  BILITOT <0.1*  PROT 6.9  ALBUMIN 3.3*   No results for input(s): "LIPASE", "AMYLASE" in the last 168 hours. No results for input(s): "AMMONIA" in the last 168 hours. CBC: Recent Labs  Lab 04/09/22 2200  WBC 12.8*  NEUTROABS 10.4*  HGB 7.6*  HCT 25.6*  MCV 89.5  PLT 322   Cardiac Enzymes: No results for input(s): "CKTOTAL", "CKMB", "CKMBINDEX", "TROPONINI" in the last 168 hours.  BNP (last 3 results) No results for input(s): "BNP" in the last 8760 hours.  ProBNP (last 3 results) No results for input(s): "PROBNP" in the last 8760 hours.  CBG: No results for input(s): "GLUCAP" in the last 168 hours.  Radiological Exams on Admission: CT HEAD WO CONTRAST (5MM)  Result Date: 04/09/2022 CLINICAL DATA:  Delirium EXAM: CT HEAD WITHOUT CONTRAST TECHNIQUE: Contiguous axial images were obtained from the base of the skull through the vertex without intravenous contrast. RADIATION DOSE REDUCTION: This exam was performed according to the departmental dose-optimization program which includes automated exposure control, adjustment of the mA and/or kV according to patient size and/or use of iterative reconstruction technique. COMPARISON:  None Available. FINDINGS: Brain: There is no mass, hemorrhage or extra-axial collection. There is generalized atrophy without lobar predilection. Hypodensity of the white matter is most commonly associated with chronic microvascular disease. Old left occipital infarct. Vascular: No abnormal hyperdensity of the major intracranial arteries or dural venous sinuses. No intracranial atherosclerosis. Skull: The visualized skull base, calvarium and extracranial soft tissues are normal. Sinuses/Orbits: No fluid levels or advanced mucosal thickening of the visualized paranasal sinuses. No mastoid or middle ear effusion. The orbits are normal. IMPRESSION: 1. No acute  intracranial abnormality. 2. Old left occipital infarct and findings of chronic microvascular disease. Electronically Signed   By: Ulyses Jarred M.D.   On: 04/09/2022 23:53   DG Chest 2 View  Result Date: 04/09/2022 CLINICAL DATA:  Altered level of consciousness, COVID EXAM: CHEST - 2 VIEW COMPARISON:  12/14/2021 FINDINGS: Frontal and lateral  views of the chest demonstrates stable enlargement of the cardiac silhouette. No acute airspace disease, effusion, or pneumothorax. No acute bony abnormalities. IMPRESSION: 1. No acute intrathoracic process. Electronically Signed   By: Randa Ngo M.D.   On: 04/09/2022 21:55    EKG: I independently viewed the EKG done and my findings are as followed: Normal sinus rhythm rate of 77.  Nonspecific ST-T changes.  QTc 487.  Assessment/Plan Present on Admission:  GI (gastrointestinal bleed)  Principal Problem:   GI (gastrointestinal bleed)  Upper GI bleed, POA Acute blood loss anemia History of GI bleed Baseline hemoglobin 12.9 Presented with hemoglobin of 7.6 1 unit PRBCs ordered to be transfused Serial H&H every 6 hours x 3 Continue IV Protonix while NPO.  COVID-19 viral infection Not hypoxic, O2 saturation 98% on room air. Chest x-ray nonacute. Obtain inflammatory markers in the morning.  Acute metabolic encephalopathy suspect multifactorial secondary to COVID-19 viral infection, volume loss from dehydration Treat underlying conditions Reorient as needed. Fall and aspiration precautions.  Hypothyroidism Resume home levothyroxine  Anxiety/depression Resume home regimen  Ulcerative colitis Resume home regimen  Hypertension Resume home Norvasc Monitor vital signs  Hyperlipidemia With hemoglobin history      DVT prophylaxis: Subcu Lovenox daily  Code Status: DNR, form in the room.  Family Communication: None at bedside.  Disposition Plan: Admitted to progressive care unit.  Consults called: None.  Admission status:  Inpatient status.   Status is: Inpatient The patient requires at least 2 midnights for further evaluation and treatment of present condition.   Kayleen Memos MD [facility] Pager [phone]  If 7PM-7AM, please contact night-coverage [email or web address] Password [facility]  04/10/2022, 12:28 AM

## 2022-04-11 ENCOUNTER — Inpatient Hospital Stay (HOSPITAL_COMMUNITY): Payer: Medicare Other

## 2022-04-11 DIAGNOSIS — D649 Anemia, unspecified: Secondary | ICD-10-CM

## 2022-04-11 LAB — CBC
HCT: 30 % — ABNORMAL LOW (ref 36.0–46.0)
Hemoglobin: 9.7 g/dL — ABNORMAL LOW (ref 12.0–15.0)
MCH: 27.4 pg (ref 26.0–34.0)
MCHC: 32.3 g/dL (ref 30.0–36.0)
MCV: 84.7 fL (ref 80.0–100.0)
Platelets: 345 10*3/uL (ref 150–400)
RBC: 3.54 MIL/uL — ABNORMAL LOW (ref 3.87–5.11)
RDW: 15.5 % (ref 11.5–15.5)
WBC: 11.6 10*3/uL — ABNORMAL HIGH (ref 4.0–10.5)
nRBC: 0 % (ref 0.0–0.2)

## 2022-04-11 LAB — TYPE AND SCREEN
ABO/RH(D): A POS
Antibody Screen: NEGATIVE
Unit division: 0

## 2022-04-11 LAB — URINE CULTURE

## 2022-04-11 LAB — BPAM RBC
Blood Product Expiration Date: 202312152359
ISSUE DATE / TIME: 202312010348
Unit Type and Rh: 6200

## 2022-04-11 LAB — BRAIN NATRIURETIC PEPTIDE: B Natriuretic Peptide: 141.2 pg/mL — ABNORMAL HIGH (ref 0.0–100.0)

## 2022-04-11 MED ORDER — SODIUM FLUORIDE 1.1 % DT PSTE: 1.0000 | PASTE | Freq: Every day | DENTAL | Status: DC

## 2022-04-11 MED ORDER — HALOPERIDOL LACTATE 5 MG/ML IJ SOLN: 2.0000 mg | Freq: Four times a day (QID) | INTRAMUSCULAR | Status: DC | PRN

## 2022-04-11 MED ORDER — VITAMIN C 500 MG PO TABS
500.0000 mg | ORAL_TABLET | Freq: Every day | ORAL | Status: DC
  Administered 2022-04-11 – 2022-04-14 (×4): 500 mg via ORAL
  Filled 2022-04-11 (×4): qty 1

## 2022-04-11 MED ORDER — ZINC SULFATE 220 (50 ZN) MG PO CAPS
220.0000 mg | ORAL_CAPSULE | ORAL | Status: DC
  Administered 2022-04-11 – 2022-04-13 (×2): 220 mg via ORAL
  Filled 2022-04-11 (×2): qty 1

## 2022-04-11 MED ORDER — FUROSEMIDE 10 MG/ML IJ SOLN
20.0000 mg | Freq: Once | INTRAMUSCULAR | Status: AC
  Administered 2022-04-11: 20 mg via INTRAVENOUS
  Filled 2022-04-11: qty 2

## 2022-04-11 MED ORDER — FERROUS SULFATE 325 (65 FE) MG PO TABS
325.0000 mg | ORAL_TABLET | Freq: Every day | ORAL | Status: DC
  Administered 2022-04-11 – 2022-04-14 (×4): 325 mg via ORAL
  Filled 2022-04-11 (×4): qty 1

## 2022-04-11 MED ORDER — BOOST / RESOURCE BREEZE PO LIQD CUSTOM
1.0000 | Freq: Three times a day (TID) | ORAL | Status: DC
  Administered 2022-04-11 – 2022-04-14 (×8): 1 via ORAL

## 2022-04-11 MED ORDER — B COMPLEX-C PO TABS
1.0000 | ORAL_TABLET | Freq: Every day | ORAL | Status: DC
  Administered 2022-04-11 – 2022-04-14 (×4): 1 via ORAL
  Filled 2022-04-11 (×4): qty 1

## 2022-04-11 MED ORDER — AMLODIPINE BESYLATE 5 MG PO TABS
5.0000 mg | ORAL_TABLET | Freq: Every day | ORAL | Status: DC
  Administered 2022-04-11 – 2022-04-14 (×4): 5 mg via ORAL
  Filled 2022-04-11 (×4): qty 1

## 2022-04-11 NOTE — Progress Notes (Signed)
Initial Nutrition Assessment  DOCUMENTATION CODES:   Not applicable  INTERVENTION:  - Add Boost Breeze po TID, each supplement provides 250 kcal and 9 grams of protein   NUTRITION DIAGNOSIS:   Inadequate oral intake related to altered GI function as evidenced by  (on CL diet).  GOAL:   Patient will meet greater than or equal to 90% of their needs  MONITOR:   PO intake, Supplement acceptance  REASON FOR ASSESSMENT:   Malnutrition Screening Tool    ASSESSMENT:   88 y.o. female admits related to AMS. PMH includes: dementia, prior CVA, HTN, HLD, ulcerative colitis, adrenal insufficiency, GI bleed. Pt is currently receiving medical management for upper GI bleeding.  Meds include: Vit C, ferrous sulfate, zinc sulfate. Labs reviewed.   The pt is currently Ox2. Pt was recently advanced to CL diet. Pt is currently unable to meet her needs. Wt stable per record. RD will continue to monitor for diet advancement. RD will add Boost Breeze TID for now. Will attempt to gather nutrition hx details at follow up.   NUTRITION - FOCUSED PHYSICAL EXAM:  Unable to perform due to remote assessment. Will attempt at f/u.   Diet Order:   Diet Order             Diet clear liquid Room service appropriate? No; Fluid consistency: Thin  Diet effective now                   EDUCATION NEEDS:   Not appropriate for education at this time  Skin:  Skin Assessment: Reviewed RN Assessment  Last BM:  unknown  Height:   Ht Readings from Last 1 Encounters:  04/09/22 '5\' 1"'$  (1.549 m)    Weight:   Wt Readings from Last 1 Encounters:  04/11/22 60 kg    Ideal Body Weight:     BMI:  Body mass index is 24.99 kg/m.  Estimated Nutritional Needs:   Kcal:  1500-1800 kcals  Protein:  75-90 gm  Fluid:  >/= 1.5 L  Thalia Bloodgood, RD, LDN, CNSC.

## 2022-04-11 NOTE — Progress Notes (Signed)
TR[facility] night cross cover note:   I was notified by RN that the patient has not be requiring restraints during this night shift, noting that she has been out of restraints during that timeframe and doing well from agitation standpoint.   I subsequently discontinued order for restraints.     Babs Bertin, DO Hospitalist

## 2022-04-11 NOTE — Progress Notes (Signed)
OT Cancellation Note  Patient Details Name: Sierra [name] MRN: [id number] DOB: 1928/07/31   Cancelled Treatment:    Reason Eval/Treat Not Completed: Other (comment) Noted pt from memory care.  Will defer OT eval to facility  Kari Baars, [place]  [phone], Thereasa Parkin 04/11/2022, 12:21 PM

## 2022-04-11 NOTE — Progress Notes (Signed)
PROGRESS NOTE        PATIENT DETAILS Name: Sierra [name] Age: 88 y.o. Sex: female Date of Birth: February 18, 1929 Admit Date: 04/09/2022 Admitting Physician Evalee Mutton Kristeen Mans, MD MVH:QIONGE, Christian Mate, MD  Brief Summary: Patient is a 88 y.o.  female with history of dementia, prior CVA, adrenal insufficiency on prednisone, HTN, HLD-who was sent from memory care unit for melanotic stools in her diaper, she was found to have acute blood loss anemia and subsequently admitted to the hospitalist service.  She was incidentally found to be COVID-positive.  Significant events: 11/30>> admitted [facility]-GI bleeding with acute blood loss anemia.  Incidentally COVID-positive.  Significant studies: 11/30>> CXR: No PNA  11/30>> CT head: No acute intracranial abnormality  Significant microbiology data: 11/30>> COVID PCR: Positive (cycle threshold 35.2) 11/30>> influenza A/B: Negative  Procedures: None  Consults: GI  Subjective:  Patient in bed, appears comfortable, denies any headache, no fever, no chest pain or pressure, no shortness of breath , no abdominal pain. No new focal weakness.   Objective: Vitals: Blood pressure (!) 135/58, pulse 72, temperature 97.9 F (36.6 C), temperature source Oral, resp. rate (!) 24, height '5\' 1"'$  (1.549 m), weight 60 kg, SpO2 94 %.   Exam:  Awake but mildly confused, No new F.N deficits,   [place].AT,PERRAL Supple Neck, No JVD,   Symmetrical Chest wall movement, Good air movement bilaterally, CTAB RRR,No Gallops, Rubs or new Murmurs,  +ve B.Sounds, Abd Soft, No tenderness,   No edema    Assessment/Plan:  Upper GI bleeding Acute blood loss anemia Apparently had melanotic stools in her diaper at memory care On aspirin for recent CVA-this has been held. No further bleeding overnight per RN staff-but hemoglobin dropped further. Being transfused 1 unit of PRBC-follow-up posttransfusion CBC Continue PPI Spoke with son-family does not  want to pursue endoscopic evaluation at this point-plan is to transfuse-continue PPI and follow closely.  Family will only possibly consent to EGD if she were to have unstable hemodynamics .  Apparently when she had a GI bleed in the past-this approach was pursued as well.  Will inform GI team. Given advanced age/frailty-stable GI bleed-suspect above is a reasonable option.  She seems to have responded well to medical treatment including packed RBC transfusion, H&H stable no signs of ongoing bleeding continue to monitor closely.  COVID-19 infection Completed 10 days of isolation at her memory care unit  Per MAR-completed course of Paxlovid 11/27  No hypoxia-chest x-ray without PNA  Monitor for now.   Acute metabolic encephalopathy likely due to recent COVID infection/dehydration Delirium superimposed on advanced dementia Supportive care Maintain delirium precautions  History of recent CVA August 2023 Difficult exam but seems to be moving all 4 extremities Hold antiplatelets given acute GI bleeding Continue statin  Hypothyroidism Levothyroxine  Adrenal insufficiency Continue prednisone  Ulcerative colitis Seems stable at present Continue mesalamine  HTN BP slightly on the higher side Hold amlodipine-until sure that GI bleeding does not recur  Dementia Resume Namenda/lorazepam/Lexapro remains at risk for delirium minimize narcotics and benzodiazepines, as needed Haldol.  Palliative care DNR in place Gentle medical treatment Family does not want to pursue EGD unless she becomes hemodynamically unstable   BMI: Estimated body mass index is 24.99 kg/m as calculated from the following:   Height as of this encounter: '5\' 1"'$  (1.549 m).   Weight as of  this encounter: 60 kg.   Code status:   Code Status: DNR   DVT Prophylaxis: SCDs Start: 04/10/22 0031   Family Communication:  Marquesa Rath [phone] updated over the phone on 04/11/22  Disposition Plan: Status is:  Inpatient Remains inpatient appropriate because: UGI bleed with ABLA   Planned Discharge Destination:Assisted living   Diet: Diet Order             Diet clear liquid Room service appropriate? Yes; Fluid consistency: Thin  Diet effective now                    MEDICATIONS: Scheduled Meds:  amLODipine  5 mg Oral Daily   ascorbic acid  500 mg Oral Daily   B-complex with vitamin C  1 tablet Oral Daily   budesonide  0.25 mg Nebulization BID   escitalopram  20 mg Oral Daily   ferrous sulfate  325 mg Oral Q breakfast   levothyroxine  75 mcg Oral Q0600   LORazepam  0.5 mg Oral BID   memantine  5 mg Oral BID   mesalamine  1.2 g Oral Q breakfast   predniSONE  5 mg Oral Q breakfast   rosuvastatin  20 mg Oral Daily   zinc sulfate  220 mg Oral QODAY   Continuous Infusions:  pantoprazole 8 mg/hr (04/11/22 1007)   PRN Meds:.acetaminophen, albuterol, ondansetron (ZOFRAN) IV   I have personally reviewed following labs and imaging studies  LABORATORY DATA:  Recent Labs  Lab 04/09/22 2200 04/10/22 0408 04/10/22 1751 04/11/22 0648  WBC 12.8*  --  14.5* 11.6*  HGB 7.6* 6.1* 10.3* 9.7*  HCT 25.6* 20.7* 31.6* 30.0*  PLT 322  --  363 345  MCV 89.5  --  85.6 84.7  MCH 26.6  --  27.9 27.4  MCHC 29.7*  --  32.6 32.3  RDW 15.7*  --  15.6* 15.5  LYMPHSABS 1.2  --   --   --   MONOABS 0.9  --   --   --   EOSABS 0.0  --   --   --   BASOSABS 0.0  --   --   --     Recent Labs  Lab 04/09/22 2200 04/10/22 0114 04/10/22 2223 04/11/22 0648  NA 134*  --  137  --   K 3.8  --  3.9  --   CL 103  --  107  --   CO2 21*  --  24  --   GLUCOSE 187*  --  131*  --   BUN 17  --  12  --   CREATININE 0.95  --  0.74  --   AST 37  --  43*  --   ALT 40  --  49*  --   ALKPHOS 36*  --  37*  --   BILITOT <0.1*  --  0.7  --   ALBUMIN 3.3*  --  3.2*  --   INR  --  1.1  --   --   BNP  --   --   --  141.2*  CALCIUM 9.1  --  8.7*  --    RADIOLOGY STUDIES/RESULTS: DG Chest Port 1  View  Result Date: 04/11/2022 CLINICAL DATA:  Shortness of breath EXAM: PORTABLE CHEST 1 VIEW COMPARISON:  April 09, 2022 FINDINGS: The heart size and mediastinal contours are stable. Heart size is enlarged. Aorta is tortuous. Patchy consolidation of left lung base is identified. The right lung  is clear. The visualized skeletal structures are stable. IMPRESSION: Left lung base pneumonia. Electronically Signed   By: Abelardo Diesel M.D.   On: 04/11/2022 08:46   CT HEAD WO CONTRAST (5MM)  Result Date: 04/09/2022 CLINICAL DATA:  Delirium EXAM: CT HEAD WITHOUT CONTRAST TECHNIQUE: Contiguous axial images were obtained from the base of the skull through the vertex without intravenous contrast. RADIATION DOSE REDUCTION: This exam was performed according to the departmental dose-optimization program which includes automated exposure control, adjustment of the mA and/or kV according to patient size and/or use of iterative reconstruction technique. COMPARISON:  None Available. FINDINGS: Brain: There is no mass, hemorrhage or extra-axial collection. There is generalized atrophy without lobar predilection. Hypodensity of the white matter is most commonly associated with chronic microvascular disease. Old left occipital infarct. Vascular: No abnormal hyperdensity of the major intracranial arteries or dural venous sinuses. No intracranial atherosclerosis. Skull: The visualized skull base, calvarium and extracranial soft tissues are normal. Sinuses/Orbits: No fluid levels or advanced mucosal thickening of the visualized paranasal sinuses. No mastoid or middle ear effusion. The orbits are normal. IMPRESSION: 1. No acute intracranial abnormality. 2. Old left occipital infarct and findings of chronic microvascular disease. Electronically Signed   By: Ulyses Jarred M.D.   On: 04/09/2022 23:53   DG Chest 2 View  Result Date: 04/09/2022 CLINICAL DATA:  Altered level of consciousness, COVID EXAM: CHEST - 2 VIEW COMPARISON:   12/14/2021 FINDINGS: Frontal and lateral views of the chest demonstrates stable enlargement of the cardiac silhouette. No acute airspace disease, effusion, or pneumothorax. No acute bony abnormalities. IMPRESSION: 1. No acute intrathoracic process. Electronically Signed   By: Randa Ngo M.D.   On: 04/09/2022 21:55     LOS: 1 day   Signature  -    Lala Lund M.D on 04/11/2022 at 10:28 AM   -  To page go to [email or web address]

## 2022-04-11 NOTE — Evaluation (Signed)
Physical Therapy Evaluation Patient Details Name: Sierra [name] MRN: [id number] DOB: 15-May-1928 Today's Date: 04/11/2022  History of Present Illness  Pt is a 88 y.o. female admitted from memory care unit on 04/09/22 with acute blood loss anemia, incidental (+) COVID. Head CT negative for acute injury. CXR without PNA. PMH includes advanced Alzheimer's disease, CVA, HTN, HLD, asthma.   Clinical Impression  Pt presents with an overall decrease in functional mobility secondary to above. Per chart, pt resident at memory care unit SNF; suspect pt requiring assist from staff for majority of mobility and ADLs. Today, pt requiring modA for bed mobility, tolerated prolonged seated EOB activity and 2x standing attempts at EOB. Pt non-verbal during session, but seemingly nodding yes/no appropriately and following majority of simple commands with increased cues. Pt would benefit from continued acute PT services to maximize functional mobility and independence prior to return to memory care SNF.      Recommendations for follow up therapy are one component of a multi-disciplinary discharge planning process, led by the attending physician.  Recommendations may be updated based on patient status, additional functional criteria and insurance authorization.  Follow Up Recommendations Skilled nursing-short term rehab (<3 hours/day) (return to SNF) Can patient physically be transported by private vehicle: No    Assistance Recommended at Discharge Frequent or constant Supervision/Assistance  Patient can return home with the following  A lot of help with walking and/or transfers;A lot of help with bathing/dressing/bathroom;Assistance with feeding;Direct supervision/assist for medications management;Direct supervision/assist for financial management    Equipment Recommendations Other (comment) (defer to next venue)  Recommendations for Other Services       Functional Status Assessment Patient has had a recent  decline in their functional status and demonstrates the ability to make significant improvements in function in a reasonable and predictable amount of time.     Precautions / Restrictions Precautions Precautions: Fall Restrictions Weight Bearing Restrictions: No      Mobility  Bed Mobility Overal bed mobility: Needs Assistance Bed Mobility: Supine to Sit, Sit to Supine     Supine to sit: Mod assist Sit to supine: Mod assist   General bed mobility comments: repeated cues to initiate movement, modA for BLE and trunk management with supine<>sit    Transfers Overall transfer level: Needs assistance                 General transfer comment: attempted 2x sit<>stand from EOB with HHA, pt requiring maxA for trunk elevation, able to clear buttocks but unable to achieve fully upright, BLEs bracing against EOB    Ambulation/Gait                  Stairs            Wheelchair Mobility    Modified Rankin (Stroke Patients Only)       Balance Overall balance assessment: Needs assistance Sitting-balance support: No upper extremity supported Sitting balance-Leahy Scale: Poor Sitting balance - Comments: prolonged sitting EOB, pt initially requiring modA to maintain midline due to R lateral and anterior lean, intermittent cues to achieve upright posture and cervical extension, pt at times falling forward but able to correct with verbal/tactile cues; bouts of static sitting with min guard. pt able to reach towards R side and L side to grab stuffed animal from beside her with min-modA for stability  Pertinent Vitals/Pain Pain Assessment Pain Assessment: Faces Faces Pain Scale: Hurts a little bit Pain Location: grimacing with supine<>sit but unable to specify where pain is, denies pain in back or legs Pain Descriptors / Indicators: Grimacing Pain Intervention(s): Monitored during session, Repositioned    Home  Living Family/patient expects to be discharged to:: Skilled nursing facility                   Additional Comments: pt resident at memory care unit SNF    Prior Function Prior Level of Function : Patient poor historian/Family not available             Mobility Comments: per chart, pt resident at memory care SNF; suspect pt with some mobility at baseline, but likely requiring significant assist. per chart review from PT Eval 3 months prior, pt was ambulatory with assist at [facility]       Hand Dominance        Extremity/Trunk Assessment   Upper Extremity Assessment Upper Extremity Assessment: Generalized weakness    Lower Extremity Assessment Lower Extremity Assessment: Generalized weakness    Cervical / Trunk Assessment Cervical / Trunk Assessment: Kyphotic  Communication   Communication: HOH;Expressive difficulties (non-verbal during session, utilized head nods)  Cognition Arousal/Alertness: Awake/alert, Lethargic Behavior During Therapy: Flat affect Overall Cognitive Status: History of cognitive impairments - at baseline                                 General Comments: per chart, h/o advanced dementia from memory care unit. arousable and participatory but seems to be falling asleep at times. non-verbal but nodding head yes/no seemingly appropriately, following some simple commands; some apparent anxiety initially scooting towards EOB but responds well to encouragement and comforted by her stuffed animal        General Comments General comments (skin integrity, edema, etc.): SpO2 >/96% on RA    Exercises General Exercises - Lower Extremity Ankle Circles/Pumps: AROM, Both, Seated Long Arc Quad: AROM, Both, Seated   Assessment/Plan    PT Assessment Patient needs continued PT services  PT Problem List Decreased strength;Decreased activity tolerance;Decreased balance;Decreased mobility;Decreased cognition;Decreased knowledge of use of DME        PT Treatment Interventions DME instruction;Gait training;Functional mobility training;Therapeutic activities;Therapeutic exercise;Balance training;Cognitive remediation;Patient/family education    PT Goals (Current goals can be found in the Care Plan section)  Acute Rehab PT Goals PT Goal Formulation: Patient unable to participate in goal setting Time For Goal Achievement: 04/25/22 Potential to Achieve Goals: Fair    Frequency Min 2X/week     Co-evaluation               AM-PAC PT "6 Clicks" Mobility  Outcome Measure Help needed turning from your back to your side while in a flat bed without using bedrails?: A Lot Help needed moving from lying on your back to sitting on the side of a flat bed without using bedrails?: A Lot Help needed moving to and from a bed to a chair (including a wheelchair)?: Total Help needed standing up from a chair using your arms (e.g., wheelchair or bedside chair)?: Total Help needed to walk in hospital room?: Total Help needed climbing 3-5 steps with a railing? : Total 6 Click Score: 8    End of Session   Activity Tolerance: Patient tolerated treatment well;Patient limited by fatigue Patient left: in bed;with call bell/phone within reach;with bed alarm set Nurse  Communication: Mobility status PT Visit Diagnosis: Other abnormalities of gait and mobility (R26.89);Muscle weakness (generalized) (M62.81)    Time: 2081-3887 PT Time Calculation (min) (ACUTE ONLY): 22 min   Charges:   PT Evaluation $PT Eval Moderate Complexity: [place], PT, DPT Acute Rehabilitation Services  Personal: [facility] Office: [place] 04/11/2022, 4:44 PM

## 2022-04-11 NOTE — Plan of Care (Signed)
  Problem: Safety: Goal: Non-violent Restraint(s) Outcome: Progressing   Problem: Education: Goal: Knowledge of General Education information will improve Description: Including pain rating scale, medication(s)/side effects and non-pharmacologic comfort measures Outcome: Progressing   Problem: Clinical Measurements: Goal: Ability to maintain clinical measurements within normal limits will improve Outcome: Progressing   Problem: Pain Managment: Goal: General experience of comfort will improve Outcome: Progressing   Problem: Safety: Goal: Ability to remain free from injury will improve Outcome: Progressing   Problem: Skin Integrity: Goal: Risk for impaired skin integrity will decrease Outcome: Progressing

## 2022-04-12 LAB — CBC WITH DIFFERENTIAL/PLATELET
Abs Immature Granulocytes: 0.11 10*3/uL — ABNORMAL HIGH (ref 0.00–0.07)
Basophils Absolute: 0 10*3/uL (ref 0.0–0.1)
Basophils Relative: 0 %
Eosinophils Absolute: 0.2 10*3/uL (ref 0.0–0.5)
Eosinophils Relative: 2 %
HCT: 33.4 % — ABNORMAL LOW (ref 36.0–46.0)
Hemoglobin: 10.5 g/dL — ABNORMAL LOW (ref 12.0–15.0)
Immature Granulocytes: 1 %
Lymphocytes Relative: 15 %
Lymphs Abs: 1.9 10*3/uL (ref 0.7–4.0)
MCH: 27.1 pg (ref 26.0–34.0)
MCHC: 31.4 g/dL (ref 30.0–36.0)
MCV: 86.1 fL (ref 80.0–100.0)
Monocytes Absolute: 1.1 10*3/uL — ABNORMAL HIGH (ref 0.1–1.0)
Monocytes Relative: 8 %
Neutro Abs: 9.9 10*3/uL — ABNORMAL HIGH (ref 1.7–7.7)
Neutrophils Relative %: 74 %
Platelets: 394 10*3/uL (ref 150–400)
RBC: 3.88 MIL/uL (ref 3.87–5.11)
RDW: 15.5 % (ref 11.5–15.5)
WBC: 13.3 10*3/uL — ABNORMAL HIGH (ref 4.0–10.5)
nRBC: 0 % (ref 0.0–0.2)

## 2022-04-12 LAB — BRAIN NATRIURETIC PEPTIDE: B Natriuretic Peptide: 64.8 pg/mL (ref 0.0–100.0)

## 2022-04-12 LAB — BASIC METABOLIC PANEL
Anion gap: 11 (ref 5–15)
BUN: 19 mg/dL (ref 8–23)
CO2: 24 mmol/L (ref 22–32)
Calcium: 9 mg/dL (ref 8.9–10.3)
Chloride: 102 mmol/L (ref 98–111)
Creatinine, Ser: 1.08 mg/dL — ABNORMAL HIGH (ref 0.44–1.00)
GFR, Estimated: 48 mL/min — ABNORMAL LOW (ref >60)
Glucose, Bld: 94 mg/dL (ref 70–99)
Potassium: 3.1 mmol/L — ABNORMAL LOW (ref 3.5–5.1)
Sodium: 137 mmol/L (ref 135–145)

## 2022-04-12 LAB — MAGNESIUM: Magnesium: 2.3 mg/dL (ref 1.7–2.4)

## 2022-04-12 MED ORDER — POTASSIUM CHLORIDE CRYS ER 20 MEQ PO TBCR
40.0000 meq | EXTENDED_RELEASE_TABLET | Freq: Two times a day (BID) | ORAL | Status: AC
  Administered 2022-04-12 (×2): 40 meq via ORAL
  Filled 2022-04-12 (×2): qty 2

## 2022-04-12 NOTE — Progress Notes (Signed)
PROGRESS NOTE        PATIENT DETAILS Name: Sierra [name] Age: 88 y.o. Sex: female Date of Birth: [birth year] Admit Date: 04/09/2022 Admitting Physician Evalee Mutton Kristeen Mans, MD ZCH:YIFOYD, Christian Mate, MD  Brief Summary: Patient is a 88 y.o.  female with history of dementia, prior CVA, adrenal insufficiency on prednisone, HTN, HLD-who was sent from memory care unit for melanotic stools in her diaper, she was found to have acute blood loss anemia and subsequently admitted to the hospitalist service.  She was incidentally found to be COVID-positive.  Significant events: 11/30>> admitted [facility]-GI bleeding with acute blood loss anemia.  Incidentally COVID-positive.  Significant studies: 11/30>> CXR: No PNA  11/30>> CT head: No acute intracranial abnormality  Significant microbiology data: 11/30>> COVID PCR: Positive (cycle threshold 35.2) 11/30>> influenza A/B: Negative  Procedures: None  Consults: GI  Subjective:  Patient in bed, appears comfortable, denies any headache, no fever, no chest pain or pressure, no shortness of breath , no abdominal pain. No new focal weakness.   Objective: Vitals: Blood pressure 117/70, pulse 81, temperature 98.4 F (36.9 C), temperature source Oral, resp. rate (!) 21, height '5\' 1"'$  (1.549 m), weight 60 kg, SpO2 96 %.   Exam:  Awake but mildly confused, No new F.N deficits,   [place].AT,PERRAL Supple Neck, No JVD,   Symmetrical Chest wall movement, Good air movement bilaterally, CTAB RRR,No Gallops, Rubs or new Murmurs,  +ve B.Sounds, Abd Soft, No tenderness,   No Cyanosis, Clubbing or edema     Assessment/Plan:  Upper GI bleeding Acute blood loss anemia Apparently had melanotic stools in her diaper at memory care On aspirin for recent CVA-this has been held. No further bleeding overnight per RN staff-but hemoglobin dropped further. Being transfused 1 unit of PRBC-follow-up posttransfusion CBC Continue PPI Previous  MD Spoke with son-family does not want to pursue endoscopic evaluation at this point-plan is to transfuse-continue PPI and follow closely.  Family will only possibly consent to EGD if she were to have unstable hemodynamics .  Apparently when she had a GI bleed in the past-this approach was pursued as well.  Will inform GI team. Given advanced age/frailty-stable GI bleed-suspect above is a reasonable option.  She seems to have responded well to medical treatment including packed RBC transfusion, H&H stable no signs of ongoing bleeding continue to monitor closely.  COVID-19 infection Completed 10 days of isolation at her memory care unit  Per MAR-completed course of Paxlovid 11/27  No hypoxia-chest x-ray without PNA  Monitor for now.   Acute metabolic encephalopathy likely due to recent COVID infection/dehydration Delirium superimposed on advanced dementia Supportive care Maintain delirium precautions  History of recent CVA August 2023 Difficult exam but seems to be moving all 4 extremities Hold antiplatelets given acute GI bleeding Continue statin  Hypothyroidism Levothyroxine  Adrenal insufficiency Continue prednisone  Ulcerative colitis Seems stable at present Continue mesalamine  HTN BP slightly on the higher side Hold amlodipine-until sure that GI bleeding does not recur  Dementia Resume Namenda/lorazepam/Lexapro remains at risk for delirium minimize narcotics and benzodiazepines, as needed Haldol.  Palliative care DNR in place Gentle medical treatment Family does not want to pursue EGD unless she becomes hemodynamically unstable   BMI: Estimated body mass index is 24.99 kg/m as calculated from the following:   Height as of this encounter: '5\' 1"'$  (1.549 m).  Weight as of this encounter: 60 kg.   Code status:   Code Status: DNR   DVT Prophylaxis: SCDs Start: 04/10/22 0031   Family Communication:  Jaylianna Tatlock [phone] updated over the phone on  04/11/22  Disposition Plan: Status is: Inpatient Remains inpatient appropriate because: UGI bleed with ABLA   Planned Discharge Destination:Assisted living   Diet: Diet Order             Diet clear liquid Room service appropriate? No; Fluid consistency: Thin  Diet effective now                    MEDICATIONS: Scheduled Meds:  amLODipine  5 mg Oral Daily   ascorbic acid  500 mg Oral Daily   B-complex with vitamin C  1 tablet Oral Daily   budesonide  0.25 mg Nebulization BID   escitalopram  20 mg Oral Daily   feeding supplement  1 Container Oral TID BM   ferrous sulfate  325 mg Oral Q breakfast   levothyroxine  75 mcg Oral Q0600   LORazepam  0.5 mg Oral BID   memantine  5 mg Oral BID   mesalamine  1.2 g Oral Q breakfast   potassium chloride  40 mEq Oral BID WC   predniSONE  5 mg Oral Q breakfast   rosuvastatin  20 mg Oral Daily   zinc sulfate  220 mg Oral QODAY   Continuous Infusions:  pantoprazole 8 mg/hr (04/12/22 0931)   PRN Meds:.acetaminophen, albuterol, haloperidol lactate, ondansetron (ZOFRAN) IV   I have personally reviewed following labs and imaging studies  LABORATORY DATA:  Recent Labs  Lab 04/09/22 2200 04/10/22 0408 04/10/22 1751 04/11/22 0648 04/12/22 0221  WBC 12.8*  --  14.5* 11.6* 13.3*  HGB 7.6* 6.1* 10.3* 9.7* 10.5*  HCT 25.6* 20.7* 31.6* 30.0* 33.4*  PLT 322  --  363 345 394  MCV 89.5  --  85.6 84.7 86.1  MCH 26.6  --  27.9 27.4 27.1  MCHC 29.7*  --  32.6 32.3 31.4  RDW 15.7*  --  15.6* 15.5 15.5  LYMPHSABS 1.2  --   --   --  1.9  MONOABS 0.9  --   --   --  1.1*  EOSABS 0.0  --   --   --  0.2  BASOSABS 0.0  --   --   --  0.0    Recent Labs  Lab 04/09/22 2200 04/10/22 0114 04/10/22 2223 04/11/22 0648 04/12/22 0221  NA 134*  --  137  --  137  K 3.8  --  3.9  --  3.1*  CL 103  --  107  --  102  CO2 21*  --  24  --  24  GLUCOSE 187*  --  131*  --  94  BUN 17  --  12  --  19  CREATININE 0.95  --  0.74  --  1.08*  AST 37   --  43*  --   --   ALT 40  --  49*  --   --   ALKPHOS 36*  --  37*  --   --   BILITOT <0.1*  --  0.7  --   --   ALBUMIN 3.3*  --  3.2*  --   --   INR  --  1.1  --   --   --   BNP  --   --   --  141.2*  64.8  MG  --   --   --   --  2.3  CALCIUM 9.1  --  8.7*  --  9.0   RADIOLOGY STUDIES/RESULTS: DG Chest Port 1 View  Result Date: 04/11/2022 CLINICAL DATA:  Shortness of breath EXAM: PORTABLE CHEST 1 VIEW COMPARISON:  April 09, 2022 FINDINGS: The heart size and mediastinal contours are stable. Heart size is enlarged. Aorta is tortuous. Patchy consolidation of left lung base is identified. The right lung is clear. The visualized skeletal structures are stable. IMPRESSION: Left lung base pneumonia. Electronically Signed   By: Abelardo Diesel M.D.   On: 04/11/2022 08:46     LOS: 2 days   Signature  -    Lala Lund M.D on 04/12/2022 at 10:00 AM   -  To page go to [email or web address]

## 2022-04-12 NOTE — Plan of Care (Signed)

## 2022-04-12 NOTE — Care Management (Signed)
  Transition of Care [facility]) Screening Note   Patient Details  Name: Sierra [name] Date of Birth: 13-Feb-1929   Transition of Care [facility]) CM/SW Contact:    Carles Collet, RN Phone Number: 04/12/2022, 4:34 PM    Transition of Care Department [facility]) has reviewed patient is following advancement through interdisciplinary progression rounds.  Patient admitted from [facility] memory care

## 2022-04-13 DIAGNOSIS — R4182 Altered mental status, unspecified: Secondary | ICD-10-CM

## 2022-04-13 LAB — CBC WITH DIFFERENTIAL/PLATELET
Abs Immature Granulocytes: 0.09 K/uL — ABNORMAL HIGH (ref 0.00–0.07)
Basophils Absolute: 0.1 K/uL (ref 0.0–0.1)
Basophils Relative: 0 %
Eosinophils Absolute: 0.2 K/uL (ref 0.0–0.5)
Eosinophils Relative: 1 %
HCT: 34.1 % — ABNORMAL LOW (ref 36.0–46.0)
Hemoglobin: 10.5 g/dL — ABNORMAL LOW (ref 12.0–15.0)
Immature Granulocytes: 1 %
Lymphocytes Relative: 13 %
Lymphs Abs: 1.8 K/uL (ref 0.7–4.0)
MCH: 26.9 pg (ref 26.0–34.0)
MCHC: 30.8 g/dL (ref 30.0–36.0)
MCV: 87.4 fL (ref 80.0–100.0)
Monocytes Absolute: 1.1 K/uL — ABNORMAL HIGH (ref 0.1–1.0)
Monocytes Relative: 8 %
Neutro Abs: 10.7 K/uL — ABNORMAL HIGH (ref 1.7–7.7)
Neutrophils Relative %: 77 %
Platelets: 376 K/uL (ref 150–400)
RBC: 3.9 MIL/uL (ref 3.87–5.11)
RDW: 15.5 % (ref 11.5–15.5)
WBC: 14 K/uL — ABNORMAL HIGH (ref 4.0–10.5)
nRBC: 0 % (ref 0.0–0.2)

## 2022-04-13 LAB — BASIC METABOLIC PANEL WITH GFR
Anion gap: 10 (ref 5–15)
BUN: 19 mg/dL (ref 8–23)
CO2: 20 mmol/L — ABNORMAL LOW (ref 22–32)
Calcium: 8.9 mg/dL (ref 8.9–10.3)
Chloride: 106 mmol/L (ref 98–111)
Creatinine, Ser: 1.07 mg/dL — ABNORMAL HIGH (ref 0.44–1.00)
GFR, Estimated: 49 mL/min — ABNORMAL LOW
Glucose, Bld: 98 mg/dL (ref 70–99)
Potassium: 3.8 mmol/L (ref 3.5–5.1)
Sodium: 136 mmol/L (ref 135–145)

## 2022-04-13 LAB — GLUCOSE, CAPILLARY: Glucose-Capillary: 234 mg/dL — ABNORMAL HIGH (ref 70–99)

## 2022-04-13 MED ORDER — PANTOPRAZOLE SODIUM 40 MG PO TBEC: 40.0000 mg | DELAYED_RELEASE_TABLET | Freq: Two times a day (BID) | ORAL | Status: AC

## 2022-04-13 MED ORDER — PANTOPRAZOLE SODIUM 40 MG PO TBEC
40.0000 mg | DELAYED_RELEASE_TABLET | Freq: Two times a day (BID) | ORAL | Status: DC
  Administered 2022-04-13 – 2022-04-14 (×3): 40 mg via ORAL
  Filled 2022-04-13 (×3): qty 1

## 2022-04-13 MED ORDER — ASPIRIN 81 MG PO TBEC: 81.0000 mg | DELAYED_RELEASE_TABLET | Freq: Every day | ORAL | 12 refills | Status: AC

## 2022-04-13 MED ORDER — LORAZEPAM 0.5 MG PO TABS: 0.5000 mg | ORAL_TABLET | Freq: Two times a day (BID) | ORAL | 0 refills | Status: AC

## 2022-04-13 NOTE — NC FL2 (Signed)
[place] LEVEL OF CARE FORM     IDENTIFICATION  Patient Name: Sierra [name] Birthdate: Oct 27, 1928 Sex: female Admission Date (Current Location): 04/09/2022  [facility] and [place] Number:  Herbalist and Address:  [facility] [place] [address], [place], [place] [address]      Provider Number: [id number]  Attending Physician Name and Address:  Thurnell Lose, MD  Relative Name and Phone Number:       Current Level of Care: Hospital Recommended Level of Care: [place] Prior Approval Number:    Date Approved/Denied:   PASRR Number: [id number]  Discharge Plan: SNF    Current Diagnoses: Patient Active Problem List   Diagnosis Date Noted   GI (gastrointestinal bleed) 04/10/2022   Upper GI bleeding 04/10/2022   Complex care coordination    History of meningioma 12/14/2021   History of craniotomy 12/14/2021   Acute right-sided weakness 12/13/2021   GIB (gastrointestinal bleeding) 11/01/2021   Hyperglycemia 11/01/2021   SIRS (systemic inflammatory response syndrome) ([place]) 11/01/2021   Depression 11/01/2021   Wound of left ankle 05/20/2017   Pancreatic mass 03/25/2017   Post-concussion vertigo 03/10/2017   Post concussive syndrome 03/06/2017   Concussion with no loss of consciousness    Fall    Alzheimer's dementia ([place]) 03/03/2017   Memory loss 12/17/2016   Gait abnormality 12/17/2016   CSF rhinorrhea 11/12/2016   Edema extremities 07/13/2016   Dyspnea on exertion 12/04/2015   Anemia 12/12/2014   Anxiety 12/12/2014   Asthma, chronic 12/12/2014   Chronic kidney disease, stage II (mild) 12/12/2014   H/O: GI bleed 12/12/2014   Postural lightheadedness 12/12/2014   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[place]) 12/12/2014   Adrenal insufficiency (H[facility]) 01/03/2014   Ulcerative colitis, chronic ([place]) 01/03/2014   Meningioma ([place]) 06/08/2013   Acute blood loss anemia 01/06/2013   Malaise and fatigue 09/21/2011    Benign hypertensive heart disease without congestive heart failure 09/21/2011   Atrial premature contractions 09/21/2011   Osteoarthritis of hip 07/27/2011   DYSPNEA ON EXERTION 05/30/2010   Hypothyroidism 05/01/2010   HOARSENESS, CHRONIC 11/05/2009   Asthmatic bronchitis, mild persistent, uncomplicated 70/05/7492   Hyperlipidemia 07/11/2007   ANEMIA, IRON DEFICIENCY, MICROCYTIC 07/11/2007   Leukocytosis 07/11/2007   THROMBOCYTOSIS 07/11/2007   HTN (hypertension) 07/11/2007   PHARYNGITIS, CHRONIC 07/11/2007   Nonallergic vasomotor rhinitis 07/11/2007   GERD (gastroesophageal reflux disease) 07/11/2007   DIVERTICULITIS, COLON 07/11/2007   ARTHRITIS 07/11/2007   CARDIAC MURMUR 07/11/2007   Diverticulitis of colon 07/11/2007    Orientation RESPIRATION BLADDER Height & Weight     Self  Normal Incontinent, External catheter Weight: 132 lb 4.4 oz (60 kg) Height:  '5\' 1"'$  (154.9 cm)  BEHAVIORAL SYMPTOMS/MOOD NEUROLOGICAL BOWEL NUTRITION STATUS      Incontinent Diet (See dc summary)  AMBULATORY STATUS COMMUNICATION OF NEEDS Skin   Extensive Assist Verbally Normal                       Personal Care Assistance Level of Assistance  Feeding, Bathing, Dressing Bathing Assistance: Maximum assistance Feeding assistance: Limited assistance Dressing Assistance: Limited assistance     Functional Limitations Info  Sight, Hearing Sight Info: Impaired Hearing Info: Impaired      SPECIAL CARE FACTORS FREQUENCY  PT (By licensed PT), OT (By licensed OT)     PT Frequency: 5x/week OT Frequency: 5x/week            Contractures Contractures Info: Not present  Additional Factors Info  Code Status, Allergies, Isolation Precautions Code Status Info: DNR Allergies Info: Aricept (Donepezil), Sulfamethoxazole, Tape, Biaxin (Clarithromycin), Ciprofloxacin, Codeine, Dilantin (Phenytoin), Doxycycline, Lactose Intolerance (Gi), Restasis (Cyclosporine), Sulfonamide Derivatives      Isolation Precautions Info: COVID     Current Medications (04/13/2022):  This is the current hospital active medication list Current Facility-Administered Medications  Medication Dose Route Frequency Provider Last Rate Last Admin   acetaminophen (TYLENOL) tablet 650 mg  650 mg Oral Q6H PRN Ghimire, Henreitta Leber, MD       albuterol (PROVENTIL) (2.5 MG/3ML) 0.083% nebulizer solution 2.5 mg  2.5 mg Inhalation Q4H PRN Jonetta Osgood, MD       amLODipine (NORVASC) tablet 5 mg  5 mg Oral Daily Thurnell Lose, MD   5 mg at 04/13/22 1324   ascorbic acid (VITAMIN C) tablet 500 mg  500 mg Oral Daily Thurnell Lose, MD   500 mg at 04/13/22 4010   B-complex with vitamin C tablet 1 tablet  1 tablet Oral Daily Thurnell Lose, MD   1 tablet at 04/13/22 0832   budesonide (PULMICORT) nebulizer solution 0.25 mg  0.25 mg Nebulization BID Jonetta Osgood, MD   0.25 mg at 04/13/22 0850   escitalopram (LEXAPRO) tablet 20 mg  20 mg Oral Daily Jonetta Osgood, MD   20 mg at 04/13/22 2725   feeding supplement (BOOST / RESOURCE BREEZE) liquid 1 Container  1 Container Oral TID BM Thurnell Lose, MD   1 Container at 04/13/22 1350   ferrous sulfate tablet 325 mg  325 mg Oral Q breakfast Thurnell Lose, MD   325 mg at 04/13/22 0831   haloperidol lactate (HALDOL) injection 2 mg  2 mg Intramuscular Q6H PRN Thurnell Lose, MD       levothyroxine (SYNTHROID) tablet 75 mcg  75 mcg Oral Q0600 Jonetta Osgood, MD   75 mcg at 04/13/22 0502   LORazepam (ATIVAN) tablet 0.5 mg  0.5 mg Oral BID Jonetta Osgood, MD   0.5 mg at 04/13/22 0832   memantine (NAMENDA) tablet 5 mg  5 mg Oral BID Jonetta Osgood, MD   5 mg at 04/13/22 0831   mesalamine (LIALDA) EC tablet 1.2 g  1.2 g Oral Q breakfast Jonetta Osgood, MD   1.2 g at 04/13/22 0831   ondansetron (ZOFRAN) injection 4 mg  4 mg Intravenous Q6H PRN Jonetta Osgood, MD       pantoprazole (PROTONIX) EC tablet 40 mg  40 mg Oral BID Thurnell Lose, MD   40 mg at 04/13/22 3664   predniSONE (DELTASONE) tablet 5 mg  5 mg Oral Q breakfast Jonetta Osgood, MD   5 mg at 04/13/22 4034   rosuvastatin (CRESTOR) tablet 20 mg  20 mg Oral Daily Jonetta Osgood, MD   20 mg at 04/13/22 7425   zinc sulfate capsule 220 mg  220 mg Oral Billey Chang, MD   220 mg at 04/13/22 0831     Discharge Medications: Please see discharge summary for a list of discharge medications.  Relevant Imaging Results:  Relevant Lab Results:   Additional Information SS#: [id number]  Benard Halsted, LCSW

## 2022-04-13 NOTE — Discharge Instructions (Addendum)
Follow with Primary MD Javier Glazier, MD in 7 days   Get CBC, CMP -  checked next visit with your primary MD or SNF MD    Activity: As tolerated with Full fall precautions use walker/cane & assistance as needed  Disposition SNF  Diet: Soft with feeding assistance and aspiration precautions.  Special Instructions: If you have smoked or chewed Tobacco  in the last 2 yrs please stop smoking, stop any regular Alcohol  and or any Recreational drug use.  On your next visit with your primary care physician please Get Medicines reviewed and adjusted.  Please request your Prim.MD to go over all Hospital Tests and Procedure/Radiological results at the follow up, please get all Hospital records sent to your Prim MD by signing hospital release before you go home.  If you experience worsening of your admission symptoms, develop shortness of breath, life threatening emergency, suicidal or homicidal thoughts you must seek medical attention immediately by calling 911 or calling your MD immediately  if symptoms less severe.  You Must read complete instructions/literature along with all the possible adverse reactions/side effects for all the Medicines you take and that have been prescribed to you. Take any new Medicines after you have completely understood and accpet all the possible adverse reactions/side effects.

## 2022-04-13 NOTE — TOC Initial Note (Addendum)
Transition of Care [facility]) - Initial/Assessment Note    Patient Details  Name: Sierra [name] MRN: [id number] Date of Birth: February 10, 1929  Transition of Care [facility]) CM/SW Contact:    Benard Halsted, LCSW Phone Number: 04/13/2022, 9:16 AM  Clinical Narrative:                 CSW spoke with Admissions at [place]. Patient normally resides on their memory care (ALF level) side. She is able to go to their SNF side for rehab at [facility] pending insurance authorization.   CSW spoke with patient's daughter to ask if she is ok with the SNF side as recommended by therapies. [name] reported agreement with SNF side. CSW initiated insurance authorization, Ref# [id number].   Expected Discharge Plan: Skilled Nursing Facility Barriers to Discharge: Insurance Authorization   Patient Goals and CMS Choice Patient states their goals for this hospitalization and ongoing recovery are:: Rehab CMS Medicare.gov Compare Post Acute Care list provided to:: Patient Represenative (must comment) Choice offered to / list presented to : Adult Children  Expected Discharge Plan and Services Expected Discharge Plan: [place] In-house Referral: Clinical Social Work   Post Acute Care Choice: [place] Living arrangements for the past 2 months:  (memory care /ALF) Expected Discharge Date: 04/13/22                                    Prior Living Arrangements/Services Living arrangements for the past 2 months:  (memory care /ALF) Lives with:: Facility Resident Patient language and need for interpreter reviewed:: Yes Do you feel safe going back to the place where you live?: Yes      Need for Family Participation in Patient Care: Yes (Comment) Care giver support system in place?: Yes (comment) Current home services: DME Criminal Activity/Legal Involvement Pertinent to Current Situation/Hospitalization: No - Comment as needed  Activities of Daily Living Home Assistive  Devices/Equipment: Hearing aid ADL Screening (condition at time of admission) Patient's cognitive ability adequate to safely complete daily activities?: No Is the patient deaf or have difficulty hearing?: Yes Does the patient have difficulty seeing, even when wearing glasses/contacts?: No Does the patient have difficulty concentrating, remembering, or making decisions?: Yes Patient able to express need for assistance with ADLs?: No Does the patient have difficulty dressing or bathing?: Yes Independently performs ADLs?: No Communication: Needs assistance Is this a change from baseline?: Pre-admission baseline Does the patient have difficulty walking or climbing stairs?: Yes Weakness of Legs: Both Weakness of Arms/Hands: Both  Permission Sought/Granted Permission sought to share information with : Facility Sport and exercise psychologist, Family Supports Permission granted to share information with : Yes, Verbal Permission Granted  Share Information with NAME: [name]  Permission granted to share info w AGENCY: [facility]  Permission granted to share info w Relationship: Daughter  Permission granted to share info w Contact Information: [phone]  Emotional Assessment Appearance:: Appears stated age Attitude/Demeanor/Rapport: Unable to Assess Affect (typically observed): Unable to Assess Orientation: : Oriented to Self, Oriented to Place Alcohol / Substance Use: Not Applicable Psych Involvement: No (comment)  Admission diagnosis:  Upper GI bleeding [K92.2] GI (gastrointestinal bleed) [K92.2] History of dementia [Z86.59] Gastrointestinal hemorrhage with melena [K92.1] Altered mental status, unspecified altered mental status type [R41.82] Anemia, unspecified type [D64.9] COVID [U07.1] Patient Active Problem List   Diagnosis Date Noted   GI (gastrointestinal bleed) 04/10/2022   Upper GI bleeding 04/10/2022  Complex care coordination    History of meningioma 12/14/2021   History of  craniotomy 12/14/2021   Acute right-sided weakness 12/13/2021   GIB (gastrointestinal bleeding) 11/01/2021   Hyperglycemia 11/01/2021   SIRS (systemic inflammatory response syndrome) ([place]) 11/01/2021   Depression 11/01/2021   Wound of left ankle 05/20/2017   Pancreatic mass 03/25/2017   Post-concussion vertigo 03/10/2017   Post concussive syndrome 03/06/2017   Concussion with no loss of consciousness    Fall    Alzheimer's dementia ([place]) 03/03/2017   Memory loss 12/17/2016   Gait abnormality 12/17/2016   CSF rhinorrhea 11/12/2016   Edema extremities 07/13/2016   Dyspnea on exertion 12/04/2015   Anemia 12/12/2014   Anxiety 12/12/2014   Asthma, chronic 12/12/2014   Chronic kidney disease, stage II (mild) 12/12/2014   H/O: GI bleed 12/12/2014   Postural lightheadedness 12/12/2014   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[place]) 12/12/2014   Adrenal insufficiency ([place]) 01/03/2014   Ulcerative colitis, chronic ([place]) 01/03/2014   Meningioma ([place]) 06/08/2013   Acute blood loss anemia 01/06/2013   Malaise and fatigue 09/21/2011   Benign hypertensive heart disease without congestive heart failure 09/21/2011   Atrial premature contractions 09/21/2011   Osteoarthritis of hip 07/27/2011   DYSPNEA ON EXERTION 05/30/2010   Hypothyroidism 05/01/2010   HOARSENESS, CHRONIC 11/05/2009   Asthmatic bronchitis, mild persistent, uncomplicated 76/72/0947   Hyperlipidemia 07/11/2007   ANEMIA, IRON DEFICIENCY, MICROCYTIC 07/11/2007   Leukocytosis 07/11/2007   THROMBOCYTOSIS 07/11/2007   HTN (hypertension) 07/11/2007   PHARYNGITIS, CHRONIC 07/11/2007   Nonallergic vasomotor rhinitis 07/11/2007   GERD (gastroesophageal reflux disease) 07/11/2007   DIVERTICULITIS, COLON 07/11/2007   ARTHRITIS 07/11/2007   CARDIAC MURMUR 07/11/2007   Diverticulitis of colon 07/11/2007   PCP:  Javier Glazier, MD Pharmacy:   [facility], [place] - [address] [place]  [address] Phone: [phone] Fax: [phone]     Social Determinants of Health (SDOH) Interventions    Readmission Risk Interventions     No data to display

## 2022-04-13 NOTE — TOC Progression Note (Signed)
Transition of Care [facility]) - Progression Note    Patient Details  Name: Sierra [name] MRN: [id number] Date of Birth: [birth year]  Transition of Care [facility]) CM/SW [place], LCSW Phone Number: 04/13/2022, 3:14 PM  Clinical Narrative:    Received call from insurance and they are sending case to their medical director for review.    Expected Discharge Plan: Skilled Nursing Facility Barriers to Discharge: Insurance Authorization  Expected Discharge Plan and Services Expected Discharge Plan: [place] In-house Referral: Clinical Social Work   Post Acute Care Choice: [place] Living arrangements for the past 2 months:  (memory care /ALF) Expected Discharge Date: 04/13/22                                     Social Determinants of Health (SDOH) Interventions    Readmission Risk Interventions     No data to display

## 2022-04-13 NOTE — Discharge Summary (Signed)
Sierra [name] JSE:[id number] DOB: November 15, 1928 DOA: 04/09/2022  PCP: Javier Glazier, MD  Admit date: 04/09/2022  Discharge date: 04/13/2022  Admitted From: SNF   Disposition:  SNF   Recommendations for Outpatient Follow-up:   Follow up with PCP in 1-2 weeks  PCP Please obtain BMP/CBC, 2 view CXR in 1week,  (see Discharge instructions)   PCP Please follow up on the following pending results: CBC, BMP in 3 to 4 days.  Outpatient GI follow-up with her primary gastroenterologist in the next 1 to 2 weeks.  Resume aspirin in a week with caution.   Home Health: None   Equipment/Devices: None  Consultations: GI Discharge Condition: Fair   CODE STATUS: DNR   Diet Recommendation: Soft diet with feeding assistance and aspiration precautions    Chief Complaint  Patient presents with   Altered Mental Status     Brief history of present illness from the day of admission and additional interim summary    88 y.o.  female with history of dementia, prior CVA, adrenal insufficiency on prednisone, HTN, HLD-who was sent from memory care unit for melanotic stools in her diaper, she was found to have acute blood loss anemia and subsequently admitted to the hospitalist service.  She was incidentally found to be COVID-positive.   Significant events: 11/30>> admitted TRH-GI bleeding with acute blood loss anemia.  Incidentally COVID-positive.   Significant studies: 11/30>> CXR: No PNA  11/30>> CT head: No acute intracranial abnormality   Significant microbiology data: 11/30>> COVID PCR: Positive (cycle threshold 35.2) 11/30>> influenza A/B: Negative   Procedures: None                                                                   Hospital Course   Upper GI bleeding Acute blood loss anemia Apparently had melanotic  stools in her diaper at memory care, she was on aspirin for CVA before, family did not want to pursue EGD due to her advanced age and frail status unless she became hemodynamically unstable, she received 1 unit of packed RBC transfusion along with IV PPI.  No signs of ongoing bleeding for the last 2 days.  Symptom-free.  Will be discharged home on twice daily PPI, holding aspirin for another 7 days.   COVID-19 infection Completed 10 days of isolation at her memory care unit  Per MAR-completed course of Paxlovid 11/27  No hypoxia-chest x-ray without PNA  She is stable from the standpoint.   Acute metabolic encephalopathy likely due to recent COVID infection/dehydration Delirium superimposed on advanced dementia Supportive care Maintain delirium precautions   History of recent CVA August 2023 Difficult exam but seems to be moving all 4 extremities Hold antiplatelets given acute GI bleeding, resume with caution in 1 week at SNF Continue statin   Hypothyroidism  Levothyroxine   Adrenal insufficiency Continue prednisone daily at home dose.   Ulcerative colitis Seems stable at present Continue mesalamine   HTN Continue home regimen   Dementia Resume Namenda/lorazepam/Lexapro remains at risk for delirium minimize narcotics and benzodiazepines.   Palliative care DNR in place Gentle medical treatment Family does not want to pursue EGD unless she becomes hemodynamically unstable, follow-up with GI outpatient   Discharge diagnosis     Principal Problem:   GI (gastrointestinal bleed) Active Problems:   Upper GI bleeding    Discharge instructions    Discharge Instructions     Discharge instructions   Complete by: As directed    Follow with Primary MD Javier Glazier, MD in 7 days   Get CBC, CMP -  checked next visit with your primary MD or SNF MD    Activity: As tolerated with Full fall precautions use walker/cane & assistance as needed  Disposition SNF  Diet: Soft  with feeding assistance and aspiration precautions.  Special Instructions: If you have smoked or chewed Tobacco  in the last 2 yrs please stop smoking, stop any regular Alcohol  and or any Recreational drug use.  On your next visit with your primary care physician please Get Medicines reviewed and adjusted.  Please request your Prim.MD to go over all Hospital Tests and Procedure/Radiological results at the follow up, please get all Hospital records sent to your Prim MD by signing hospital release before you go home.  If you experience worsening of your admission symptoms, develop shortness of breath, life threatening emergency, suicidal or homicidal thoughts you must seek medical attention immediately by calling 911 or calling your MD immediately  if symptoms less severe.  You Must read complete instructions/literature along with all the possible adverse reactions/side effects for all the Medicines you take and that have been prescribed to you. Take any new Medicines after you have completely understood and accpet all the possible adverse reactions/side effects.   Increase activity slowly   Complete by: As directed        Discharge Medications   Allergies as of 04/13/2022       Reactions   Aricept [donepezil] Other (See Comments)   Not specified on MAR   Sulfamethoxazole Nausea And Vomiting   Tape Other (See Comments)   TAPE TEARS AND BRUISES THE SKIN!! Not listed on MAR   Biaxin [clarithromycin] Other (See Comments)   Bitter taste   Ciprofloxacin Rash   Codeine Nausea And Vomiting   Dilantin [phenytoin] Rash   Doxycycline Nausea Only   Lactose Intolerance (gi) Other (See Comments)   Gas Not listed on MAR   Restasis [cyclosporine] Other (See Comments), Swelling   Eyes burn   Sulfonamide Derivatives Rash        Medication List     STOP taking these medications    budesonide 0.25 MG/2ML nebulizer solution Commonly known as: PULMICORT   Paxlovid (150/100) 10 x 150 MG &  10 x '100MG'$  Tbpk Generic drug: nirmatrelvir & ritonavir       TAKE these medications    albuterol (2.5 MG/3ML) 0.083% nebulizer solution Commonly known as: PROVENTIL Inhale 3 mLs (2.5 mg total) into the lungs every 4 (four) hours as needed for wheezing or shortness of breath.   amLODipine 2.5 MG tablet Commonly known as: NORVASC Take 2.5 mg by mouth daily.   ascorbic acid 500 MG tablet Commonly known as: VITAMIN C Take 500 mg by mouth daily. For 10 days starting 03/31/22  aspirin EC 81 MG tablet Take 1 tablet (81 mg total) by mouth daily. Swallow whole. Start taking on: April 20, 2022 What changed: These instructions start on April 20, 2022. If you are unsure what to do until then, ask your doctor or other care provider.   budesonide-formoterol 160-4.5 MCG/ACT inhaler Commonly known as: SYMBICORT Inhale 2 puffs into the lungs 2 (two) times daily.   cefTRIAXone 1 g injection Commonly known as: ROCEPHIN Inject 1 g into the muscle once.   cholecalciferol 25 MCG (1000 UNIT) tablet Commonly known as: VITAMIN D3 Take 1,000 Units by mouth daily. For 10 days starting 03/31/22   escitalopram 20 MG tablet Commonly known as: LEXAPRO Take 20 mg by mouth daily.   ferrous sulfate 325 (65 FE) MG tablet Take 325 mg by mouth daily with breakfast.   levothyroxine 75 MCG tablet Commonly known as: Synthroid Take 1 tablet (75 mcg total) by mouth daily at 6 (six) AM. What changed: when to take this   LORazepam 0.5 MG tablet Commonly known as: ATIVAN Take 1 tablet (0.5 mg total) by mouth in the morning and at bedtime.   memantine 5 MG tablet Commonly known as: NAMENDA Take 5 mg by mouth 2 (two) times daily.   mesalamine 1.2 g EC tablet Commonly known as: LIALDA Take 1.2 g by mouth daily with breakfast.   nitrofurantoin (macrocrystal-monohydrate) 100 MG capsule Commonly known as: MACROBID Take 100 mg by mouth 2 (two) times daily. For 7 days starting 04/04/22    pantoprazole 40 MG tablet Commonly known as: PROTONIX Take 1 tablet (40 mg total) by mouth 2 (two) times daily.   predniSONE 5 MG tablet Commonly known as: DELTASONE Take 5 mg by mouth daily.   PROBIOTIC-10 PO Take 1 tablet by mouth daily.   rosuvastatin 20 MG tablet Commonly known as: Crestor Take 1 tablet (20 mg total) by mouth daily.   Sodium Fluoride 5000 PPM 1.1 % Pste Generic drug: Sodium Fluoride Place 1 Application onto teeth daily.   VITAMIN B COMPLEX PO Take 1 tablet by mouth daily.   Zinc 50 MG Tabs Take 50 mg by mouth every other day. For 10 days starting 03/31/22         Follow-up Information     Javier Glazier, MD. Schedule an appointment as soon as possible for a visit in 1 week(s).   Specialty: Internal Medicine Contact information: [place] [address] [phone]                 Major procedures and Radiology Reports - PLEASE review detailed and final reports thoroughly  -      DG Chest Port 1 View  Result Date: 04/11/2022 CLINICAL DATA:  Shortness of breath EXAM: PORTABLE CHEST 1 VIEW COMPARISON:  April 09, 2022 FINDINGS: The heart size and mediastinal contours are stable. Heart size is enlarged. Aorta is tortuous. Patchy consolidation of left lung base is identified. The right lung is clear. The visualized skeletal structures are stable. IMPRESSION: Left lung base pneumonia. Electronically Signed   By: Abelardo Diesel M.D.   On: 04/11/2022 08:46   CT HEAD WO CONTRAST (5MM)  Result Date: 04/09/2022 CLINICAL DATA:  Delirium EXAM: CT HEAD WITHOUT CONTRAST TECHNIQUE: Contiguous axial images were obtained from the base of the skull through the vertex without intravenous contrast. RADIATION DOSE REDUCTION: This exam was performed according to the departmental dose-optimization program which includes automated exposure control, adjustment of the mA and/or kV according to  patient size and/or use of iterative reconstruction  technique. COMPARISON:  None Available. FINDINGS: Brain: There is no mass, hemorrhage or extra-axial collection. There is generalized atrophy without lobar predilection. Hypodensity of the white matter is most commonly associated with chronic microvascular disease. Old left occipital infarct. Vascular: No abnormal hyperdensity of the major intracranial arteries or dural venous sinuses. No intracranial atherosclerosis. Skull: The visualized skull base, calvarium and extracranial soft tissues are normal. Sinuses/Orbits: No fluid levels or advanced mucosal thickening of the visualized paranasal sinuses. No mastoid or middle ear effusion. The orbits are normal. IMPRESSION: 1. No acute intracranial abnormality. 2. Old left occipital infarct and findings of chronic microvascular disease. Electronically Signed   By: Ulyses Jarred M.D.   On: 04/09/2022 23:53   DG Chest 2 View  Result Date: 04/09/2022 CLINICAL DATA:  Altered level of consciousness, COVID EXAM: CHEST - 2 VIEW COMPARISON:  12/14/2021 FINDINGS: Frontal and lateral views of the chest demonstrates stable enlargement of the cardiac silhouette. No acute airspace disease, effusion, or pneumothorax. No acute bony abnormalities. IMPRESSION: 1. No acute intrathoracic process. Electronically Signed   By: Randa Ngo M.D.   On: 04/09/2022 21:55    Today   Subjective    Remedy Corporan today has no headache,no chest abdominal pain,no new weakness tingling or numbness, feels much better wants to go home today.    Objective   Blood pressure (!) 125/55, pulse 80, temperature 98 F (36.7 C), temperature source Axillary, resp. rate (!) 21, height '5\' 1"'$  (1.549 m), weight 60 kg, SpO2 92 %.   Intake/Output Summary (Last 24 hours) at 04/13/2022 1052 Last data filed at 04/13/2022 0400 Gross per 24 hour  Intake 669.69 ml  Output 600 ml  Net 69.69 ml    Exam  Awake mildly confused, No new F.N deficits,    [place].AT,PERRAL Supple Neck,   Symmetrical Chest  wall movement, Good air movement bilaterally, CTAB RRR,No Gallops,   +ve B.Sounds, Abd Soft, Non tender,  No Cyanosis, Clubbing or edema    Data Review   Recent Labs  Lab 04/09/22 2200 04/10/22 0408 04/10/22 1751 04/11/22 0648 04/12/22 0221 04/13/22 0811  WBC 12.8*  --  14.5* 11.6* 13.3* 14.0*  HGB 7.6* 6.1* 10.3* 9.7* 10.5* 10.5*  HCT 25.6* 20.7* 31.6* 30.0* 33.4* 34.1*  PLT 322  --  363 345 394 376  MCV 89.5  --  85.6 84.7 86.1 87.4  MCH 26.6  --  27.9 27.4 27.1 26.9  MCHC 29.7*  --  32.6 32.3 31.4 30.8  RDW 15.7*  --  15.6* 15.5 15.5 15.5  LYMPHSABS 1.2  --   --   --  1.9 1.8  MONOABS 0.9  --   --   --  1.1* 1.1*  EOSABS 0.0  --   --   --  0.2 0.2  BASOSABS 0.0  --   --   --  0.0 0.1    Recent Labs  Lab 04/09/22 2200 04/10/22 0114 04/10/22 2223 04/11/22 0648 04/12/22 0221 04/13/22 0811  NA 134*  --  137  --  137 136  K 3.8  --  3.9  --  3.1* 3.8  CL 103  --  107  --  102 106  CO2 21*  --  24  --  24 20*  GLUCOSE 187*  --  131*  --  94 98  BUN 17  --  12  --  19 19  CREATININE 0.95  --  0.74  --  1.08* 1.07*  AST 37  --  43*  --   --   --   ALT 40  --  49*  --   --   --   ALKPHOS 36*  --  37*  --   --   --   BILITOT <0.1*  --  0.7  --   --   --   ALBUMIN 3.3*  --  3.2*  --   --   --   INR  --  1.1  --   --   --   --   BNP  --   --   --  141.2* 64.8  --   MG  --   --   --   --  2.3  --   CALCIUM 9.1  --  8.7*  --  9.0 8.9     Total Time in preparing paper work, data evaluation and todays exam - 35 minutes  Signature  -    Lala Lund M.D on 04/13/2022 at 10:52 AM   -  To page go to [email or web address]

## 2022-04-14 ENCOUNTER — Other Ambulatory Visit: Payer: Self-pay

## 2022-04-14 LAB — CBC WITH DIFFERENTIAL/PLATELET
Abs Immature Granulocytes: 0.11 10*3/uL — ABNORMAL HIGH (ref 0.00–0.07)
Basophils Absolute: 0 10*3/uL (ref 0.0–0.1)
Basophils Relative: 0 %
Eosinophils Absolute: 0.2 10*3/uL (ref 0.0–0.5)
Eosinophils Relative: 1 %
HCT: 36 % (ref 36.0–46.0)
Hemoglobin: 11.2 g/dL — ABNORMAL LOW (ref 12.0–15.0)
Immature Granulocytes: 1 %
Lymphocytes Relative: 12 %
Lymphs Abs: 1.6 10*3/uL (ref 0.7–4.0)
MCH: 26.7 pg (ref 26.0–34.0)
MCHC: 31.1 g/dL (ref 30.0–36.0)
MCV: 85.9 fL (ref 80.0–100.0)
Monocytes Absolute: 1 10*3/uL (ref 0.1–1.0)
Monocytes Relative: 7 %
Neutro Abs: 11 10*3/uL — ABNORMAL HIGH (ref 1.7–7.7)
Neutrophils Relative %: 79 %
Platelets: 394 10*3/uL (ref 150–400)
RBC: 4.19 MIL/uL (ref 3.87–5.11)
RDW: 15 % (ref 11.5–15.5)
WBC: 14 10*3/uL — ABNORMAL HIGH (ref 4.0–10.5)
nRBC: 0 % (ref 0.0–0.2)

## 2022-04-14 LAB — BASIC METABOLIC PANEL
Anion gap: 8 (ref 5–15)
BUN: 18 mg/dL (ref 8–23)
CO2: 23 mmol/L (ref 22–32)
Calcium: 9.3 mg/dL (ref 8.9–10.3)
Chloride: 105 mmol/L (ref 98–111)
Creatinine, Ser: 0.94 mg/dL (ref 0.44–1.00)
GFR, Estimated: 57 mL/min — ABNORMAL LOW (ref >60)
Glucose, Bld: 96 mg/dL (ref 70–99)
Potassium: 3.4 mmol/L — ABNORMAL LOW (ref 3.5–5.1)
Sodium: 136 mmol/L (ref 135–145)

## 2022-04-14 MED ORDER — POTASSIUM CHLORIDE CRYS ER 20 MEQ PO TBCR
40.0000 meq | EXTENDED_RELEASE_TABLET | Freq: Once | ORAL | Status: AC
  Administered 2022-04-14: 40 meq via ORAL
  Filled 2022-04-14: qty 2

## 2022-04-14 NOTE — Care Management Important Message (Signed)
Patient Important Message  Patient Details  Name: Sierra [name] MRN: [id number] Date of Birth: 1928-08-29   Medicare Important Message Given:        [name] 04/14/2022, 3:32 PM

## 2022-04-14 NOTE — Care Management Important Message (Signed)
Important Message  Patient Details  Name: Sierra [name] MRN: [id number] Date of Birth: [birth year]   Medicare Important Message Given:  Yes     [name] 04/14/2022, 3:32 PM

## 2022-04-14 NOTE — TOC Transition Note (Signed)
Transition of Care [facility]) - CM/SW Discharge Note   Patient Details  Name: Sierra [name] MRN: [id number] Date of Birth: 06-16-1928  Transition of Care [facility]) CM/SW Contact:  Benard Halsted, LCSW Phone Number: 04/14/2022, 12:10 PM   Clinical Narrative:    Patient will DC to: [facility] Anticipated DC date: 04/14/22 Family notified: Daughter, leslie Transport by: Corey Harold   Per MD patient ready for DC to [facility]. RN to call report prior to discharge [phone]). RN, patient, patient's family, and facility notified of DC. Discharge Summary and FL2 sent to facility. DC packet on chart. Ambulance transport requested for patient.   CSW will sign off for now as social work intervention is no longer needed. Please consult [place] again if new needs arise.     Final next level of care: Skilled Nursing Facility Barriers to Discharge: Barriers Resolved   Patient Goals and CMS Choice Patient states their goals for this hospitalization and ongoing recovery are:: Rehab CMS Medicare.gov Compare Post Acute Care list provided to:: Patient Represenative (must comment) Choice offered to / list presented to : Adult Children  Discharge Placement   Existing PASRR number confirmed : 04/14/22          Patient chooses bed at: [facility] Patient to be transferred to facility by: [facility] Name of family member notified: [name] Patient and family notified of of transfer: 04/14/22  Discharge Plan and Services In-house Referral: Clinical Social Work   Post Acute Care Choice: [place]                               Social Determinants of Health (SDOH) Interventions     Readmission Risk Interventions     No data to display

## 2022-04-14 NOTE — Progress Notes (Signed)
TRH night cross cover note:   I was notified by RN of brief run of VT on tele (unsure how many beats) before spent continues conversion to sinus rhythm, with insulin rates in the 70s.  Blood pressure stable with systolic values in the 539Y.  Patient asleep at the time, and subsequently asymptomatic. existing BMP/CBC ordered for AM. I asked to be notified any additional runs of VT on tele.      Babs Bertin, DO Hospitalist

## 2022-04-14 NOTE — TOC Progression Note (Addendum)
Transition of Care [facility]) - Progression Note    Patient Details  Name: Sierra [name] MRN: [id number] Date of Birth: [birth year]  [facility] CM/SW [place], LCSW Phone Number: 04/14/2022, 9:51 AM  Clinical Narrative:    9:51am-CSW confirmed with [facility] that they do not need to wait to hear back from insurance and they have discussed with family about using patient's Medicare B for therapies.   4:52pm-Received call from [place], authorization was denied.    Expected Discharge Plan: Skilled Nursing Facility Barriers to Discharge: Insurance Authorization  Expected Discharge Plan and Services Expected Discharge Plan: [place] In-house Referral: Clinical Social Work   Post Acute Care Choice: [place] Living arrangements for the past 2 months:  (memory care /ALF) Expected Discharge Date: 04/13/22                                     Social Determinants of Health (SDOH) Interventions    Readmission Risk Interventions     No data to display

## 2022-04-14 NOTE — Progress Notes (Signed)
[facility]                                                                                                                                                                         Patient Demographics  Sierra [name], is a 88 y.o. female  DGL:[id number]  [id number]  DOB - [birth year]  Admit date - 04/09/2022  Admitting Physician Evalee Mutton Kristeen Mans, MD  Outpatient Primary MD for the patient is Javier Glazier, MD  LOS - 4   Chief Complaint  Patient presents with   Altered Mental Status        Assessment & Plan    Patient seen briefly today due for discharge soon per Discharge done yesterday by me, no further issues, Vital signs stable, patient feels fine.      Medications  Scheduled Meds:  amLODipine  5 mg Oral Daily   ascorbic acid  500 mg Oral Daily   B-complex with vitamin C  1 tablet Oral Daily   budesonide  0.25 mg Nebulization BID   escitalopram  20 mg Oral Daily   feeding supplement  1 Container Oral TID BM   ferrous sulfate  325 mg Oral Q breakfast   levothyroxine  75 mcg Oral Q0600   LORazepam  0.5 mg Oral BID   memantine  5 mg Oral BID   mesalamine  1.2 g Oral Q breakfast   pantoprazole  40 mg Oral BID   potassium chloride  40 mEq Oral Once   predniSONE  5 mg Oral Q breakfast   rosuvastatin  20 mg Oral Daily   zinc sulfate  220 mg Oral QODAY   Continuous Infusions: PRN Meds:.acetaminophen, albuterol, haloperidol lactate, ondansetron (ZOFRAN) IV    Time Spent in minutes   10 minutes   Lala Lund M.D on 04/14/2022 at 10:35 AM  Between 7am to 7pm - Pager - [phone]  After 7pm go to [email or web address] - password [email or web address]  And look for the night coverage person covering for me after hours  [facility] Office  [phone]    Subjective:   Sierra [name] today has, No headache, No chest pain, No abdominal pain - No Nausea, No new weakness tingling or  numbness, No Cough - SOB.   Objective:   Vitals:   04/13/22 2138 04/14/22 0000 04/14/22 0800 04/14/22 0900  BP:  (!) 127/56 (!) 120/54   Pulse:  79 74   Resp:  (!) 23 20   Temp:   (!) 97.2 F (36.2 C)   TempSrc:   Oral   SpO2:   91% 94%  Weight: 55.7 kg     Height:        Wt Readings from Last 3 Encounters:  04/13/22  55.7 kg  12/13/21 70.6 kg  11/20/21 66.6 kg     Intake/Output Summary (Last 24 hours) at 04/14/2022 1035 Last data filed at 04/13/2022 2115 Gross per 24 hour  Intake 241 ml  Output --  Net 241 ml    Exam  Awake  No new F.N deficits, Normal affect [place].AT,PERRAL Supple Neck, No JVD,   Symmetrical Chest wall movement, Good air movement bilaterally, CTAB RRR,No Gallops, Rubs or new Murmurs,  +ve B.Sounds, Abd Soft, No tenderness,   No Cyanosis, Clubbing or edema   Data Review

## 2022-04-14 NOTE — Plan of Care (Signed)
?  Problem: Nutrition: ?Goal: Adequate nutrition will be maintained ?Outcome: Progressing ?  ?Problem: Coping: ?Goal: Level of anxiety will decrease ?Outcome: Progressing ?  ?Problem: Elimination: ?Goal: Will not experience complications related to bowel motility ?Outcome: Progressing ?Goal: Will not experience complications related to urinary retention ?Outcome: Progressing ?  ?Problem: Pain Managment: ?Goal: General experience of comfort will improve ?Outcome: Progressing ?  ?Problem: Safety: ?Goal: Ability to remain free from injury will improve ?Outcome: Progressing ?  ?

## 2022-04-15 ENCOUNTER — Telehealth (HOSPITAL_BASED_OUTPATIENT_CLINIC_OR_DEPARTMENT_OTHER): Payer: Self-pay | Admitting: Emergency Medicine
